# Patient Record
Sex: Female | Born: 1950 | Race: Black or African American | Hispanic: No | Marital: Married | Smoking: Former smoker
Health system: Southern US, Community
[De-identification: ages and names within clinical notes are randomized; demographics above are authoritative.]

## PROBLEM LIST (undated history)

## (undated) DIAGNOSIS — M545 Low back pain: Secondary | ICD-10-CM

## (undated) DIAGNOSIS — M546 Pain in thoracic spine: Secondary | ICD-10-CM

## (undated) DIAGNOSIS — R0603 Acute respiratory distress: Secondary | ICD-10-CM

## (undated) DIAGNOSIS — R9431 Abnormal electrocardiogram [ECG] [EKG]: Secondary | ICD-10-CM

## (undated) DIAGNOSIS — Z6841 Body Mass Index (BMI) 40.0 and over, adult: Secondary | ICD-10-CM

## (undated) DIAGNOSIS — R1011 Right upper quadrant pain: Secondary | ICD-10-CM

## (undated) DIAGNOSIS — R222 Localized swelling, mass and lump, trunk: Secondary | ICD-10-CM

## (undated) DIAGNOSIS — D729 Disorder of white blood cells, unspecified: Secondary | ICD-10-CM

## (undated) DIAGNOSIS — D649 Anemia, unspecified: Secondary | ICD-10-CM

## (undated) DIAGNOSIS — R0602 Shortness of breath: Secondary | ICD-10-CM

## (undated) DIAGNOSIS — K219 Gastro-esophageal reflux disease without esophagitis: Secondary | ICD-10-CM

## (undated) DIAGNOSIS — E876 Hypokalemia: Secondary | ICD-10-CM

## (undated) DIAGNOSIS — J90 Pleural effusion, not elsewhere classified: Secondary | ICD-10-CM

## (undated) DIAGNOSIS — Z9221 Personal history of antineoplastic chemotherapy: Secondary | ICD-10-CM

## (undated) DIAGNOSIS — R591 Generalized enlarged lymph nodes: Secondary | ICD-10-CM

## (undated) DIAGNOSIS — G8929 Other chronic pain: Secondary | ICD-10-CM

## (undated) DIAGNOSIS — R05 Cough: Secondary | ICD-10-CM

## (undated) DIAGNOSIS — C8330 Diffuse large B-cell lymphoma, unspecified site: Secondary | ICD-10-CM

## (undated) DIAGNOSIS — R059 Cough, unspecified: Secondary | ICD-10-CM

## (undated) DIAGNOSIS — C8590 Non-Hodgkin lymphoma, unspecified, unspecified site: Secondary | ICD-10-CM

## (undated) DIAGNOSIS — Z9889 Other specified postprocedural states: Secondary | ICD-10-CM

## (undated) HISTORY — PX: ABDOMINAL HYSTERECTOMY: SHX81

---

## 1999-05-13 ENCOUNTER — Other Ambulatory Visit: Admission: RE | Admit: 1999-05-13 | Discharge: 1999-05-13 | Payer: Self-pay | Admitting: Obstetrics and Gynecology

## 1999-05-20 ENCOUNTER — Ambulatory Visit (HOSPITAL_COMMUNITY): Admission: RE | Admit: 1999-05-20 | Discharge: 1999-05-20 | Payer: Self-pay | Admitting: Obstetrics

## 2001-05-06 ENCOUNTER — Ambulatory Visit (HOSPITAL_COMMUNITY): Admission: RE | Admit: 2001-05-06 | Discharge: 2001-05-06 | Payer: Self-pay | Admitting: *Deleted

## 2001-05-28 ENCOUNTER — Encounter: Payer: Self-pay | Admitting: Internal Medicine

## 2001-05-28 ENCOUNTER — Ambulatory Visit (HOSPITAL_COMMUNITY): Admission: RE | Admit: 2001-05-28 | Discharge: 2001-05-28 | Payer: Self-pay | Admitting: Internal Medicine

## 2001-05-30 ENCOUNTER — Ambulatory Visit (HOSPITAL_COMMUNITY): Admission: RE | Admit: 2001-05-30 | Discharge: 2001-05-30 | Payer: Self-pay | Admitting: Internal Medicine

## 2002-06-19 ENCOUNTER — Other Ambulatory Visit: Admission: RE | Admit: 2002-06-19 | Discharge: 2002-06-19 | Payer: Self-pay | Admitting: Internal Medicine

## 2005-05-24 ENCOUNTER — Encounter: Admission: RE | Admit: 2005-05-24 | Discharge: 2005-05-24 | Payer: Self-pay | Admitting: Surgery

## 2007-09-21 ENCOUNTER — Emergency Department (HOSPITAL_COMMUNITY): Admission: EM | Admit: 2007-09-21 | Discharge: 2007-09-21 | Payer: Self-pay | Admitting: Family Medicine

## 2008-04-23 ENCOUNTER — Emergency Department (HOSPITAL_COMMUNITY): Admission: EM | Admit: 2008-04-23 | Discharge: 2008-04-23 | Payer: Self-pay | Admitting: Family Medicine

## 2009-03-03 ENCOUNTER — Other Ambulatory Visit: Admission: RE | Admit: 2009-03-03 | Discharge: 2009-03-03 | Payer: Self-pay | Admitting: Internal Medicine

## 2009-05-24 ENCOUNTER — Emergency Department (HOSPITAL_COMMUNITY): Admission: EM | Admit: 2009-05-24 | Discharge: 2009-05-24 | Payer: Self-pay | Admitting: Family Medicine

## 2010-01-23 ENCOUNTER — Encounter: Payer: Self-pay | Admitting: Surgery

## 2010-03-21 LAB — POCT RAPID STREP A (OFFICE): Streptococcus, Group A Screen (Direct): POSITIVE — AB

## 2010-05-20 NOTE — Procedures (Signed)
[facility]  Patient:    Jo, Collins Visit Number: [id number] MRN: [id number]          Service Type: END Location: ENDO Attending Physician:  Sabino Gasser Dictated by:   Sabino Gasser, M.D. Proc. Date: 05/06/01 Admit Date:  05/06/2001                             Procedure Report  DATE OF BIRTH:  March 30, 1950.  PROCEDURE:  Colonoscopy.  INDICATIONS:  Colon cancer screening.  ANESTHESIA:  Demerol 70 mg, Versed 8 mg.  DESCRIPTION OF PROCEDURE:  With the patient mildly sedated in the left lateral decubitus position, the Olympus videoscopic colonoscope was inserted in the rectum and passed under direct vision to the cecum, identified by the ileocecal valve and appendiceal orifice, both of which were photographed. From this point, the colonoscope was slowly withdrawn taking circumferential views of the entire colonic mucosa stopping only then in the rectum, which appeared normal in direct view and showed hemorrhoids in retroflex view. The endoscope was straightened and withdrawn. The patients vital signs and pulse oximeter remained stable. The patient tolerated the procedure well without apparent complications.  FINDINGS:  Internal hemorrhoids, otherwise an unremarkable examination.  PLAN:  Repeat examination possibly in five years. Dictated by:   Sabino Gasser, M.D. Attending Physician:  Sabino Gasser DD:  05/06/01 TD:  05/06/01 Job: [id number]

## 2016-07-31 ENCOUNTER — Encounter (HOSPITAL_COMMUNITY): Payer: Self-pay | Admitting: *Deleted

## 2016-07-31 ENCOUNTER — Emergency Department (HOSPITAL_COMMUNITY)
Admission: EM | Admit: 2016-07-31 | Discharge: 2016-07-31 | Disposition: A | Discharge status: Home / Self Care | Payer: Medicare Other | Attending: Emergency Medicine | Admitting: Emergency Medicine

## 2016-07-31 ENCOUNTER — Ambulatory Visit (INDEPENDENT_AMBULATORY_CARE_PROVIDER_SITE_OTHER): Payer: Medicare Other

## 2016-07-31 ENCOUNTER — Encounter (HOSPITAL_COMMUNITY): Payer: Self-pay

## 2016-07-31 ENCOUNTER — Ambulatory Visit (HOSPITAL_COMMUNITY)
Admission: EM | Admit: 2016-07-31 | Discharge: 2016-07-31 | Disposition: A | Discharge status: Home / Self Care | Payer: Medicare Other | Attending: Emergency Medicine | Admitting: Emergency Medicine

## 2016-07-31 DIAGNOSIS — Z87891 Personal history of nicotine dependence: Secondary | ICD-10-CM | POA: Diagnosis not present

## 2016-07-31 DIAGNOSIS — J181 Lobar pneumonia, unspecified organism: Secondary | ICD-10-CM | POA: Diagnosis not present

## 2016-07-31 DIAGNOSIS — R062 Wheezing: Secondary | ICD-10-CM | POA: Diagnosis not present

## 2016-07-31 DIAGNOSIS — J189 Pneumonia, unspecified organism: Secondary | ICD-10-CM

## 2016-07-31 DIAGNOSIS — R0602 Shortness of breath: Secondary | ICD-10-CM

## 2016-07-31 LAB — POCT I-STAT, CHEM 8
BUN: 8 mg/dL (ref 6–20)
CHLORIDE: 111 mmol/L (ref 101–111)
CREATININE: 0.9 mg/dL (ref 0.44–1.00)
Calcium, Ion: 0.81 mmol/L — CL (ref 1.15–1.40)
GLUCOSE: 96 mg/dL (ref 65–99)
HCT: 36 % (ref 36.0–46.0)
Hemoglobin: 12.2 g/dL (ref 12.0–15.0)
POTASSIUM: 3.7 mmol/L (ref 3.5–5.1)
Sodium: 137 mmol/L (ref 135–145)
TCO2: 18 mmol/L (ref 0–100)

## 2016-07-31 MED ORDER — IPRATROPIUM-ALBUTEROL 0.5-2.5 (3) MG/3ML IN SOLN
3.0000 mL | Freq: Once | RESPIRATORY_TRACT | Status: AC
  Administered 2016-07-31: 3 mL via RESPIRATORY_TRACT

## 2016-07-31 MED ORDER — IPRATROPIUM-ALBUTEROL 0.5-2.5 (3) MG/3ML IN SOLN
RESPIRATORY_TRACT | Status: AC
  Filled 2016-07-31: qty 3

## 2016-07-31 MED ORDER — ALBUTEROL SULFATE HFA 108 (90 BASE) MCG/ACT IN AERS
2.0000 | INHALATION_SPRAY | Freq: Once | RESPIRATORY_TRACT | Status: AC
Start: 2016-07-31 — End: 2016-07-31
  Administered 2016-07-31: 2 via RESPIRATORY_TRACT
  Filled 2016-07-31: qty 6.7

## 2016-07-31 MED ORDER — SODIUM CHLORIDE 0.9 % IN NEBU
INHALATION_SOLUTION | RESPIRATORY_TRACT | Status: AC
  Filled 2016-07-31: qty 3

## 2016-07-31 MED ORDER — DOXYCYCLINE HYCLATE 100 MG PO CAPS: 100.0000 mg | ORAL_CAPSULE | Freq: Two times a day (BID) | ORAL | 0 refills | Status: DC

## 2016-07-31 NOTE — Discharge Instructions (Signed)
Take tylenol 2 pills 4 times a day for pain or fever.  Drink plenty of fluids.  Return for worsening shortness of breath, headache, confusion. Follow up with your family doctor. Use your inhaler(2puffs) every 4 hours while awake for the next couple days, return if you need to use it more often.

## 2016-07-31 NOTE — Discharge Instructions (Signed)
Based on your signs, symptoms, and blood work, these meet the criteria for possible hospital admission, however this decision will be made down in the emergency room, but I believe your too sick to be treated here in the urgent care. Because of this I recommend going to the ER.

## 2016-07-31 NOTE — ED Notes (Signed)
Patient is getting dressed.

## 2016-07-31 NOTE — ED Provider Notes (Signed)
CSN: [id number]     Arrival date & time 07/31/16  1603 History   None    No chief complaint on file.  (Consider location/radiation/quality/duration/timing/severity/associated sxs/prior Treatment) 66 year old female presents to clinic for evaluation of wheezing and shortness of breath. States he's been ongoing for 3 days, worse with exertion. She is concerned about the possibility of bronchitis, and she has had this before and has had similar symptoms. Her symptoms are worse with exertion, states she becomes winded after walking short distances. She has no known allergies, denies any medical history, however she does appear obese. Is not taking any prescription medicines either. She does smoke daily, however does not drink or use street drugs. Otherwise reports to be in good health.      History reviewed. No pertinent past medical history. History reviewed. No pertinent surgical history. History reviewed. No pertinent family history. Social History  Substance Use Topics  . Smoking status: Current Every Day Smoker    Types: Cigarettes  . Smokeless tobacco: Not on file  . Alcohol use No   OB History    No data available     Review of Systems  Constitutional: Positive for chills, fatigue and fever.  Respiratory: Positive for cough, chest tightness, shortness of breath and wheezing.   Cardiovascular: Negative.   Gastrointestinal: Negative.   Musculoskeletal: Negative.   Skin: Negative.   Neurological: Negative.     Allergies  Patient has no known allergies.  Home Medications   Prior to Admission medications   Not on File   Meds Ordered and Administered this Visit   Medications  ipratropium-albuterol (DUONEB) 0.5-2.5 (3) MG/3ML nebulizer solution 3 mL (3 mLs Nebulization Given 07/31/16 1651)    BP 123/84 (BP Location: Right Arm)   Pulse 72   Temp 98.5 F (36.9 C) (Oral)   Resp (!) 24   SpO2 100%  No data found.   Physical Exam  Constitutional: She is oriented to  person, place, and time. She appears well-developed and well-nourished. No distress.  HENT:  Head: Normocephalic.  Right Ear: External ear normal.  Left Ear: External ear normal.  Eyes: Conjunctivae are normal.  Neck: Normal range of motion.  Cardiovascular: Normal rate and regular rhythm.   Pulmonary/Chest: Tachypnea noted. She has wheezes in the right upper field and the left upper field. She has rhonchi in the right middle field and the right lower field.  Neurological: She is alert and oriented to person, place, and time.  Skin: Skin is warm and dry. Capillary refill takes less than 2 seconds. She is not diaphoretic.  Psychiatric: She has a normal mood and affect. Her behavior is normal.  Nursing note and vitals reviewed.   Urgent Care Course     Procedures (including critical care time)  Labs Review Labs Reviewed  POCT I-STAT, CHEM 8 - Abnormal; Notable for the following:       Result Value   Calcium, Ion 0.81 (*)    All other components within normal limits    Imaging Review Dg Chest 2 View  Result Date: 07/31/2016 CLINICAL DATA:  Three days of nonproductive cough associated with shortness of breath and wheezing. Current smoker. EXAM: CHEST  2 VIEW COMPARISON:  Report of a chest x-ray of May 28, 2001. FINDINGS: The left lung is clear. On the right there is volume loss with obscuration of the hemidiaphragm. There is fluid in the minor fissure. There is no mediastinal shift. The heart and pulmonary vascularity are normal. There is mild  tortuosity of the ascending thoracic aorta. The bony thorax exhibits no acute abnormality. IMPRESSION: Atelectasis or pneumonia with right pleural E fusion. Followup PA and lateral chest X-ray is recommended in 3-4 weeks following trial of antibiotic therapy to ensure resolution and exclude underlying malignancy. Electronically Signed   By: David  Martinique M.D.   On: 07/31/2016 16:50     Visual Acuity Review  Right Eye Distance:   Left Eye  Distance:   Bilateral Distance:    Right Eye Near:   Left Eye Near:    Bilateral Near:         MDM   1. Community acquired pneumonia of right lower lobe of lung ([place])      CURB-65  C: Confusion (-) U: BUN 8 R: RR (24) BP: not hypotensive Age 46 or greater: (+)  Score: 2, meets criteria for admission. Will transfer to the ER for further evaluation and management.    Barnet Glasgow, NP 07/31/16 1715

## 2016-07-31 NOTE — ED Triage Notes (Signed)
Pt sent here for urgent care for PNA. She was seen there for SOb and received a breathing treatment. Pt denies shortness of breath now. Denies chest pain. Lung sounds clear.

## 2016-07-31 NOTE — ED Triage Notes (Addendum)
Pt  Denies   Any  Chest  Pain   She  Is  A  Smoker   Who  Has  Had   Several   Days  Of   Shortness  Of  Breath  On  Exertion   At this  Time  She  Is  Awake  And  Alert   And  Oriented     She   Is  Coughing

## 2016-07-31 NOTE — ED Provider Notes (Addendum)
[facility] DEPT Provider Note   CSN: [id number] Arrival date & time: 07/31/16  1722     History   Chief Complaint Chief Complaint  Patient presents with  . Pneumonia    HPI Jo Collins is a 66 y.o. female.  66 yo F with a chief complaint of shortness of breath. She is having cough and congestion as well. Going on for the past 3 days. She was having difficulty with smoking so she quit. She went to urgent care where she had a chest x-ray concerning for right-sided pneumonia. They were concerned about her shortness of breath so they sent her to the ED for further evaluation and possible admission.   The history is provided by the patient.  Pneumonia  This is a new problem. The current episode started 2 days ago. The problem occurs constantly. The problem has been gradually worsening. Associated symptoms include shortness of breath. Pertinent negatives include no chest pain and no headaches. Nothing aggravates the symptoms. Nothing relieves the symptoms. She has tried nothing for the symptoms. The treatment provided no relief.    History reviewed. No pertinent past medical history.  There are no active problems to display for this patient.   History reviewed. No pertinent surgical history.  OB History    No data available       Home Medications    Prior to Admission medications   Medication Sig Start Date End Date Taking? Authorizing Provider  doxycycline (VIBRAMYCIN) 100 MG capsule Take 1 capsule (100 mg total) by mouth 2 (two) times daily. 07/31/16   Deno Etienne, DO    Family History History reviewed. No pertinent family history.  Social History Social History  Substance Use Topics  . Smoking status: Former Smoker    Types: Cigarettes    Quit date: 07/28/2016  . Smokeless tobacco: Never Used  . Alcohol use No     Allergies   Patient has no known allergies.   Review of Systems Review of Systems  Constitutional: Negative for chills and fever.  HENT:  Positive for congestion. Negative for rhinorrhea.   Eyes: Negative for redness and visual disturbance.  Respiratory: Positive for cough and shortness of breath. Negative for wheezing.   Cardiovascular: Negative for chest pain and palpitations.  Gastrointestinal: Negative for nausea and vomiting.  Genitourinary: Negative for dysuria and urgency.  Musculoskeletal: Negative for arthralgias and myalgias.  Skin: Negative for pallor and wound.  Neurological: Negative for dizziness and headaches.     Physical Exam Updated Vital Signs BP (!) 143/77 (BP Location: Left Arm)   Pulse 73   Temp 98.3 F (36.8 C) (Oral)   Resp (!) 26   Ht 5' 1.5" (1.562 m)   Wt 129.3 kg (285 lb)   SpO2 97%   BMI 52.98 kg/m   Physical Exam  Constitutional: She is oriented to person, place, and time. She appears well-developed and well-nourished. No distress.  obese  HENT:  Head: Normocephalic and atraumatic.  Swollen turbinates, posterior nasal drip, no noted sinus ttp, tm normal bilaterally.    Eyes: Pupils are equal, round, and reactive to light. EOM are normal.  Neck: Normal range of motion. Neck supple.  Cardiovascular: Normal rate and regular rhythm.  Exam reveals no gallop and no friction rub.   No murmur heard. Pulmonary/Chest: Effort normal. She has no wheezes. She has no rales.  Abdominal: Soft. She exhibits no distension and no mass. There is no tenderness. There is no guarding.  Musculoskeletal: She exhibits no edema  or tenderness.  Neurological: She is alert and oriented to person, place, and time.  Skin: Skin is warm and dry. She is not diaphoretic.  Psychiatric: She has a normal mood and affect. Her behavior is normal.  Nursing note and vitals reviewed.    ED Treatments / Results  Labs (all labs ordered are listed, but only abnormal results are displayed) Labs Reviewed - No data to display  EKG  EKG Interpretation None       Radiology Dg Chest 2 View  Result Date:  07/31/2016 CLINICAL DATA:  Three days of nonproductive cough associated with shortness of breath and wheezing. Current smoker. EXAM: CHEST  2 VIEW COMPARISON:  Report of a chest x-ray of May 28, 2001. FINDINGS: The left lung is clear. On the right there is volume loss with obscuration of the hemidiaphragm. There is fluid in the minor fissure. There is no mediastinal shift. The heart and pulmonary vascularity are normal. There is mild tortuosity of the ascending thoracic aorta. The bony thorax exhibits no acute abnormality. IMPRESSION: Atelectasis or pneumonia with right pleural E fusion. Followup PA and lateral chest X-ray is recommended in 3-4 weeks following trial of antibiotic therapy to ensure resolution and exclude underlying malignancy. Electronically Signed   By: David  Martinique M.D.   On: 07/31/2016 16:50    Procedures Procedures (including critical care time)  Medications Ordered in ED Medications  albuterol (PROVENTIL HFA;VENTOLIN HFA) 108 (90 Base) MCG/ACT inhaler 2 puff (not administered)     Initial Impression / Assessment and Plan / ED Course  I have reviewed the triage vital signs and the nursing notes.  Pertinent labs & imaging results that were available during my care of the patient were reviewed by me and considered in my medical decision making (see chart for details).     66 yo F With a chief complaint of cough and shortness of breath. Found to have pneumonia at urgent care. They calculated a curb 65 score of 2. By calculation has the patient had a 1. Respiratory rate is less than 30 her BUNs normal she is not hypoxic or hypotensive. I discussed this risk with the patient and she is willing to do this trial is an outpatient. She did get a breathing treatment at urgent care that she thinks significantly improved her symptoms. She has no wheezes on my exam. I will give her an albuterol inhaler to take home.  Discussed smoking cessation with patient and was they were offerred  resources to help stop.  Total time was 5 min CPT code 99406.    8:04 PM:  I have discussed the diagnosis/risks/treatment options with the patient and believe the pt to be eligible for discharge home to follow-up with PCP. We also discussed returning to the ED immediately if new or worsening sx occur. We discussed the sx which are most concerning (e.g., sudden worsening sob, need to use your inhaler more often than every 4 hours, confusion) that necessitate immediate return. Medications administered to the patient during their visit and any new prescriptions provided to the patient are listed below.  Medications given during this visit Medications  albuterol (PROVENTIL HFA;VENTOLIN HFA) 108 (90 Base) MCG/ACT inhaler 2 puff (not administered)     The patient appears reasonably screen and/or stabilized for discharge and I doubt any other medical condition or other [facility] requiring further screening, evaluation, or treatment in the ED at this time prior to discharge.    Final Clinical Impressions(s) / ED Diagnoses  Final diagnoses:  Community acquired pneumonia of right lower lobe of lung ([place])    New Prescriptions New Prescriptions   DOXYCYCLINE (VIBRAMYCIN) 100 MG CAPSULE    Take 1 capsule (100 mg total) by mouth 2 (two) times daily.         Deno Etienne, DO 07/31/16 2004    Deno Etienne, DO 07/31/16 2013

## 2016-07-31 NOTE — ED Notes (Signed)
Pt denies CP and SOB and does not want another EKG at this time.

## 2016-08-06 ENCOUNTER — Inpatient Hospital Stay (HOSPITAL_COMMUNITY)
Admission: EM | Admit: 2016-08-06 | Discharge: 2016-08-15 | DRG: 824 | Disposition: A | Discharge status: Home / Self Care | Payer: Medicare Other | Attending: Internal Medicine | Admitting: Internal Medicine

## 2016-08-06 ENCOUNTER — Emergency Department (HOSPITAL_COMMUNITY): Payer: Medicare Other

## 2016-08-06 ENCOUNTER — Encounter (HOSPITAL_COMMUNITY): Payer: Self-pay | Admitting: Emergency Medicine

## 2016-08-06 DIAGNOSIS — Z833 Family history of diabetes mellitus: Secondary | ICD-10-CM

## 2016-08-06 DIAGNOSIS — K219 Gastro-esophageal reflux disease without esophagitis: Secondary | ICD-10-CM | POA: Diagnosis present

## 2016-08-06 DIAGNOSIS — Z825 Family history of asthma and other chronic lower respiratory diseases: Secondary | ICD-10-CM

## 2016-08-06 DIAGNOSIS — Z803 Family history of malignant neoplasm of breast: Secondary | ICD-10-CM

## 2016-08-06 DIAGNOSIS — D649 Anemia, unspecified: Secondary | ICD-10-CM | POA: Diagnosis present

## 2016-08-06 DIAGNOSIS — R222 Localized swelling, mass and lump, trunk: Secondary | ICD-10-CM | POA: Diagnosis present

## 2016-08-06 DIAGNOSIS — J9811 Atelectasis: Secondary | ICD-10-CM | POA: Diagnosis present

## 2016-08-06 DIAGNOSIS — C8338 Diffuse large B-cell lymphoma, lymph nodes of multiple sites: Principal | ICD-10-CM | POA: Diagnosis present

## 2016-08-06 DIAGNOSIS — R1011 Right upper quadrant pain: Secondary | ICD-10-CM | POA: Diagnosis present

## 2016-08-06 DIAGNOSIS — Z823 Family history of stroke: Secondary | ICD-10-CM

## 2016-08-06 DIAGNOSIS — R591 Generalized enlarged lymph nodes: Secondary | ICD-10-CM

## 2016-08-06 DIAGNOSIS — Z87891 Personal history of nicotine dependence: Secondary | ICD-10-CM

## 2016-08-06 DIAGNOSIS — J90 Pleural effusion, not elsewhere classified: Secondary | ICD-10-CM | POA: Diagnosis not present

## 2016-08-06 DIAGNOSIS — E538 Deficiency of other specified B group vitamins: Secondary | ICD-10-CM | POA: Diagnosis present

## 2016-08-06 DIAGNOSIS — R0602 Shortness of breath: Secondary | ICD-10-CM | POA: Diagnosis present

## 2016-08-06 DIAGNOSIS — R9431 Abnormal electrocardiogram [ECG] [EKG]: Secondary | ICD-10-CM | POA: Diagnosis present

## 2016-08-06 DIAGNOSIS — Z8249 Family history of ischemic heart disease and other diseases of the circulatory system: Secondary | ICD-10-CM

## 2016-08-06 DIAGNOSIS — M546 Pain in thoracic spine: Secondary | ICD-10-CM

## 2016-08-06 DIAGNOSIS — C8590 Non-Hodgkin lymphoma, unspecified, unspecified site: Secondary | ICD-10-CM | POA: Diagnosis present

## 2016-08-06 DIAGNOSIS — Z6841 Body Mass Index (BMI) 40.0 and over, adult: Secondary | ICD-10-CM

## 2016-08-06 DIAGNOSIS — Z9889 Other specified postprocedural states: Secondary | ICD-10-CM

## 2016-08-06 DIAGNOSIS — J9690 Respiratory failure, unspecified, unspecified whether with hypoxia or hypercapnia: Secondary | ICD-10-CM

## 2016-08-06 HISTORY — DX: Gastro-esophageal reflux disease without esophagitis: K21.9

## 2016-08-06 HISTORY — DX: Non-Hodgkin lymphoma, unspecified, unspecified site: C85.90

## 2016-08-06 LAB — BASIC METABOLIC PANEL
Anion gap: 7 (ref 5–15)
BUN: 17 mg/dL (ref 6–20)
CALCIUM: 9.1 mg/dL (ref 8.9–10.3)
CO2: 23 mmol/L (ref 22–32)
CREATININE: 1.09 mg/dL — AB (ref 0.44–1.00)
Chloride: 109 mmol/L (ref 101–111)
GFR calc Af Amer: >60 mL/min (ref >60)
GFR, EST NON AFRICAN AMERICAN: 52 mL/min — AB (ref >60)
Glucose, Bld: 109 mg/dL — ABNORMAL HIGH (ref 65–99)
Potassium: 4 mmol/L (ref 3.5–5.1)
SODIUM: 139 mmol/L (ref 135–145)

## 2016-08-06 LAB — CBC WITH DIFFERENTIAL/PLATELET
Basophils Absolute: 0 10*3/uL (ref 0.0–0.1)
Basophils Relative: 0 %
EOS ABS: 1.3 10*3/uL — AB (ref 0.0–0.7)
EOS PCT: 13 %
HCT: 36.5 % (ref 36.0–46.0)
Hemoglobin: 11.7 g/dL — ABNORMAL LOW (ref 12.0–15.0)
LYMPHS ABS: 1.6 10*3/uL (ref 0.7–4.0)
Lymphocytes Relative: 16 %
MCH: 27 pg (ref 26.0–34.0)
MCHC: 32.1 g/dL (ref 30.0–36.0)
MCV: 84.1 fL (ref 78.0–100.0)
MONOS PCT: 8 %
Monocytes Absolute: 0.8 10*3/uL (ref 0.1–1.0)
Neutro Abs: 6.6 10*3/uL (ref 1.7–7.7)
Neutrophils Relative %: 63 %
PLATELETS: 323 10*3/uL (ref 150–400)
RBC: 4.34 MIL/uL (ref 3.87–5.11)
RDW: 13 % (ref 11.5–15.5)
WBC: 10.2 10*3/uL (ref 4.0–10.5)

## 2016-08-06 NOTE — ED Notes (Signed)
Labs reviewed.

## 2016-08-06 NOTE — ED Notes (Signed)
Patient transported to CT 

## 2016-08-06 NOTE — ED Triage Notes (Signed)
Pt c/o sob and lower back pain, last seen on ED last Monday and sent home with po abx and inhaler, pt states she is getting progressively worse unable to sleep at night time.

## 2016-08-07 ENCOUNTER — Emergency Department (HOSPITAL_COMMUNITY): Payer: Medicare Other

## 2016-08-07 ENCOUNTER — Inpatient Hospital Stay (HOSPITAL_COMMUNITY): Payer: Medicare Other

## 2016-08-07 ENCOUNTER — Encounter (HOSPITAL_COMMUNITY): Payer: Self-pay | Admitting: Internal Medicine

## 2016-08-07 DIAGNOSIS — D649 Anemia, unspecified: Secondary | ICD-10-CM | POA: Diagnosis present

## 2016-08-07 DIAGNOSIS — R9431 Abnormal electrocardiogram [ECG] [EKG]: Secondary | ICD-10-CM | POA: Diagnosis present

## 2016-08-07 DIAGNOSIS — K219 Gastro-esophageal reflux disease without esophagitis: Secondary | ICD-10-CM | POA: Diagnosis present

## 2016-08-07 DIAGNOSIS — D518 Other vitamin B12 deficiency anemias: Secondary | ICD-10-CM | POA: Diagnosis not present

## 2016-08-07 DIAGNOSIS — Z833 Family history of diabetes mellitus: Secondary | ICD-10-CM | POA: Diagnosis not present

## 2016-08-07 DIAGNOSIS — J90 Pleural effusion, not elsewhere classified: Secondary | ICD-10-CM

## 2016-08-07 DIAGNOSIS — R591 Generalized enlarged lymph nodes: Secondary | ICD-10-CM | POA: Diagnosis not present

## 2016-08-07 DIAGNOSIS — R1011 Right upper quadrant pain: Secondary | ICD-10-CM | POA: Diagnosis not present

## 2016-08-07 DIAGNOSIS — Z825 Family history of asthma and other chronic lower respiratory diseases: Secondary | ICD-10-CM | POA: Diagnosis not present

## 2016-08-07 DIAGNOSIS — R0602 Shortness of breath: Secondary | ICD-10-CM

## 2016-08-07 DIAGNOSIS — E538 Deficiency of other specified B group vitamins: Secondary | ICD-10-CM | POA: Diagnosis present

## 2016-08-07 DIAGNOSIS — C8228 Follicular lymphoma grade III, unspecified, lymph nodes of multiple sites: Secondary | ICD-10-CM | POA: Diagnosis not present

## 2016-08-07 DIAGNOSIS — Z87891 Personal history of nicotine dependence: Secondary | ICD-10-CM | POA: Diagnosis not present

## 2016-08-07 DIAGNOSIS — D500 Iron deficiency anemia secondary to blood loss (chronic): Secondary | ICD-10-CM

## 2016-08-07 DIAGNOSIS — Z803 Family history of malignant neoplasm of breast: Secondary | ICD-10-CM | POA: Diagnosis not present

## 2016-08-07 DIAGNOSIS — J9811 Atelectasis: Secondary | ICD-10-CM | POA: Diagnosis present

## 2016-08-07 DIAGNOSIS — Z823 Family history of stroke: Secondary | ICD-10-CM | POA: Diagnosis not present

## 2016-08-07 DIAGNOSIS — R222 Localized swelling, mass and lump, trunk: Secondary | ICD-10-CM

## 2016-08-07 DIAGNOSIS — C8359 Lymphoblastic (diffuse) lymphoma, extranodal and solid organ sites: Secondary | ICD-10-CM | POA: Diagnosis not present

## 2016-08-07 DIAGNOSIS — C8338 Diffuse large B-cell lymphoma, lymph nodes of multiple sites: Secondary | ICD-10-CM | POA: Diagnosis present

## 2016-08-07 DIAGNOSIS — M546 Pain in thoracic spine: Secondary | ICD-10-CM | POA: Diagnosis not present

## 2016-08-07 DIAGNOSIS — Z8249 Family history of ischemic heart disease and other diseases of the circulatory system: Secondary | ICD-10-CM | POA: Diagnosis not present

## 2016-08-07 DIAGNOSIS — Z6841 Body Mass Index (BMI) 40.0 and over, adult: Secondary | ICD-10-CM | POA: Diagnosis not present

## 2016-08-07 HISTORY — PX: IR THORACENTESIS ASP PLEURAL SPACE W/IMG GUIDE: IMG5380

## 2016-08-07 HISTORY — DX: Pleural effusion, not elsewhere classified: J90

## 2016-08-07 HISTORY — DX: Abnormal electrocardiogram (ECG) (EKG): R94.31

## 2016-08-07 HISTORY — DX: Anemia, unspecified: D64.9

## 2016-08-07 HISTORY — DX: Shortness of breath: R06.02

## 2016-08-07 HISTORY — DX: Localized swelling, mass and lump, trunk: R22.2

## 2016-08-07 LAB — HEPATIC FUNCTION PANEL
ALK PHOS: 51 U/L (ref 38–126)
ALT: 19 U/L (ref 14–54)
AST: 20 U/L (ref 15–41)
Albumin: 3.1 g/dL — ABNORMAL LOW (ref 3.5–5.0)
BILIRUBIN TOTAL: 0.6 mg/dL (ref 0.3–1.2)
Total Protein: 6.6 g/dL (ref 6.5–8.1)

## 2016-08-07 LAB — PROTEIN, TOTAL: TOTAL PROTEIN: 6.6 g/dL (ref 6.5–8.1)

## 2016-08-07 LAB — GRAM STAIN

## 2016-08-07 LAB — BODY FLUID CELL COUNT WITH DIFFERENTIAL
EOS FL: 23 %
LYMPHS FL: 57 %
Monocyte-Macrophage-Serous Fluid: 19 % — ABNORMAL LOW (ref 50–90)
Neutrophil Count, Fluid: 1 % (ref 0–25)
Total Nucleated Cell Count, Fluid: 3550 cu mm — ABNORMAL HIGH (ref 0–1000)

## 2016-08-07 LAB — LACTATE DEHYDROGENASE, PLEURAL OR PERITONEAL FLUID: LD FL: 180 U/L — AB (ref 3–23)

## 2016-08-07 LAB — PROTIME-INR
INR: 1.09
Prothrombin Time: 14.2 seconds (ref 11.4–15.2)

## 2016-08-07 LAB — PROTEIN, PLEURAL OR PERITONEAL FLUID: Total protein, fluid: 3.9 g/dL

## 2016-08-07 LAB — LACTATE DEHYDROGENASE: LDH: 229 U/L — ABNORMAL HIGH (ref 98–192)

## 2016-08-07 LAB — AMYLASE, PLEURAL OR PERITONEAL FLUID: Amylase, Fluid: 31 U/L

## 2016-08-07 LAB — I-STAT CG4 LACTIC ACID, ED: Lactic Acid, Venous: 1.08 mmol/L (ref 0.5–1.9)

## 2016-08-07 LAB — BRAIN NATRIURETIC PEPTIDE: B Natriuretic Peptide: 39.4 pg/mL (ref 0.0–100.0)

## 2016-08-07 MED ORDER — OXYCODONE-ACETAMINOPHEN 5-325 MG PO TABS
1.0000 | ORAL_TABLET | ORAL | Status: DC | PRN
  Administered 2016-08-07 – 2016-08-10 (×11): 1 via ORAL
  Filled 2016-08-07 (×11): qty 1

## 2016-08-07 MED ORDER — OXYCODONE-ACETAMINOPHEN 5-325 MG PO TABS
1.0000 | ORAL_TABLET | Freq: Once | ORAL | Status: AC
  Administered 2016-08-07: 1 via ORAL
  Filled 2016-08-07: qty 1

## 2016-08-07 MED ORDER — ORAL CARE MOUTH RINSE
15.0000 mL | Freq: Two times a day (BID) | OROMUCOSAL | Status: DC
  Administered 2016-08-07 – 2016-08-14 (×6): 15 mL via OROMUCOSAL

## 2016-08-07 MED ORDER — PANTOPRAZOLE SODIUM 40 MG PO TBEC
40.0000 mg | DELAYED_RELEASE_TABLET | Freq: Every day | ORAL | Status: DC
  Administered 2016-08-07 – 2016-08-15 (×9): 40 mg via ORAL
  Filled 2016-08-07 (×10): qty 1

## 2016-08-07 MED ORDER — SODIUM CHLORIDE 0.9 % IV SOLN
INTRAVENOUS | Status: AC
  Administered 2016-08-07 – 2016-08-08 (×2): via INTRAVENOUS

## 2016-08-07 MED ORDER — ALPRAZOLAM 0.25 MG PO TABS
0.2500 mg | ORAL_TABLET | Freq: Three times a day (TID) | ORAL | Status: DC | PRN
  Administered 2016-08-07 – 2016-08-10 (×3): 0.25 mg via ORAL
  Filled 2016-08-07 (×6): qty 1

## 2016-08-07 MED ORDER — KETOROLAC TROMETHAMINE 30 MG/ML IJ SOLN
30.0000 mg | Freq: Once | INTRAMUSCULAR | Status: AC
  Administered 2016-08-07: 30 mg via INTRAVENOUS
  Filled 2016-08-07: qty 1

## 2016-08-07 MED ORDER — LIDOCAINE HCL (PF) 1 % IJ SOLN
INTRAMUSCULAR | Status: AC
  Filled 2016-08-07: qty 30

## 2016-08-07 MED ORDER — LIDOCAINE HCL 1 % IJ SOLN
INTRAMUSCULAR | Status: AC | PRN
  Administered 2016-08-07: 10 mL

## 2016-08-07 MED ORDER — IPRATROPIUM-ALBUTEROL 0.5-2.5 (3) MG/3ML IN SOLN
3.0000 mL | RESPIRATORY_TRACT | Status: DC | PRN
  Administered 2016-08-10 – 2016-08-14 (×8): 3 mL via RESPIRATORY_TRACT
  Filled 2016-08-07 (×9): qty 3

## 2016-08-07 MED ORDER — HEPARIN SODIUM (PORCINE) 5000 UNIT/ML IJ SOLN
5000.0000 [IU] | Freq: Three times a day (TID) | INTRAMUSCULAR | Status: DC
  Administered 2016-08-07 – 2016-08-08 (×3): 5000 [IU] via SUBCUTANEOUS
  Filled 2016-08-07 (×3): qty 1

## 2016-08-07 NOTE — ED Notes (Signed)
Pt was able to ambulate to restroom and back without assistance

## 2016-08-07 NOTE — Consult Note (Signed)
Name: Jamiyah Dingley MRN: [id number] DOB: [birth year]    ADMISSION DATE:  08/06/2016 CONSULTATION DATE:  08/07/16  REFERRING MD :  Allyson Sabal  CHIEF COMPLAINT:  SOB   HISTORY OF PRESENT ILLNESS:  Jo Collins is a 66 y.o. female with a PMH as outlined below.  She presented to [facility] ED 8/6 with SOB, progressive NP cough, orthopnea, back pain.  She had been seen a week prior and was discharged from ED with doxycycline and albuterol.  She took these but did not get much relief.  Had associated generalized fatigue along with chills and decreased appetite.  Denies fevers/sweats, chest pain, N/V/D, abd pain (although did have some mild nausea and abd pain after taking doxycycline).  She states that she never really "felt sick".  She had CXR which demonstrated a right pleural effusion.  CT of the chest demonstrated moderate loculated right sided effusion with compressive atelectasis, confluent prevertebral soft tissue abnormality along the descending thoracic aorta and esophagus concerning for an enlarged lymph node mass, bilateral 66mm nodular densities in upper lobes.  She was taken for right thoracentesis by IR 8/6 and PCCM was called in consultation for further recs and workup.  She has no hx of malignancy.  No prior hx effusions.  Denies weight loss, hemoptysis.  She is an ex smoker, quit roughly 1 week ago and has roughly 25 pack year history.   PAST MEDICAL HISTORY :   has a past medical history of GERD (gastroesophageal reflux disease).  has a past surgical history that includes Abdominal hysterectomy. Prior to Admission medications   Medication Sig Start Date End Date Taking? Authorizing Provider  doxycycline (VIBRAMYCIN) 100 MG capsule Take 1 capsule (100 mg total) by mouth 2 (two) times daily. 07/31/16   Deno Etienne, DO   No Known Allergies  FAMILY HISTORY:  family history includes COPD in her mother; Diabetes Mellitus II in her brother, sister, sister, and sister; Hypertension in her sister; Stroke  in her sister. SOCIAL HISTORY:  reports that she quit smoking 10 days ago. Her smoking use included Cigarettes. She has a 23.50 pack-year smoking history. She has never used smokeless tobacco. She reports that she does not drink alcohol.  REVIEW OF SYSTEMS:   All negative; except for those that are bolded, which indicate positives.  Constitutional: weight loss, weight gain, night sweats, fevers, chills, fatigue, weakness.  HEENT: headaches, sore throat, sneezing, nasal congestion, post nasal drip, difficulty swallowing, tooth/dental problems, visual complaints, visual changes, ear aches. Neuro: difficulty with speech, weakness, numbness, ataxia. CV:  chest pain, orthopnea, PND, swelling in lower extremities, dizziness, palpitations, syncope.  Resp: cough, hemoptysis, dyspnea, wheezing. GI: heartburn, indigestion, abdominal pain, nausea, vomiting, diarrhea, constipation, change in bowel habits, loss of appetite, hematemesis, melena, hematochezia.  GU: dysuria, change in color of urine, urgency or frequency, flank pain, hematuria. MSK: joint pain or swelling, decreased range of motion. Psych: change in mood or affect, depression, anxiety, suicidal ideations, homicidal ideations. Skin: rash, itching, bruising.    SUBJECTIVE:  Feels better after thoracentesis.  Had 700cc of serosanguinous fluid removed.  VITAL SIGNS: Temp:  [97.7 F (36.5 C)-98 F (36.7 C)] 98 F (36.7 C) (08/06 0449) Pulse Rate:  [59-75] 72 (08/06 0449) Resp:  [17-21] 20 (08/06 0449) BP: (132-153)/(83-115) 149/83 (08/06 1220) SpO2:  [93 %-98 %] 95 % (08/06 1220) Weight:  [129.3 kg (285 lb)] 129.3 kg (285 lb) (08/06 0449)  PHYSICAL EXAMINATION: General: Adult female, in NAD, visiting with husband. Neuro: A&O x 3, no deficits.  HEENT: [place] / AT. MMM. Cardiovascular: RRR, no M/R/G. Lungs: Diminished in bases R > L. Abdomen: Obese.  Ventral hernia that is easily reducible. Musculoskeletal: No deformities, no  edema. Skin: Warm, dry.    Recent Labs Lab 07/31/16 1703 08/06/16 2132  NA 137 139  K 3.7 4.0  CL 111 109  CO2  --  23  BUN 8 17  CREATININE 0.90 1.09*  GLUCOSE 96 109*    Recent Labs Lab 07/31/16 1703 08/06/16 2132  HGB 12.2 11.7*  HCT 36.0 36.5  WBC  --  10.2  PLT  --  323   Dg Chest 2 View  Result Date: 08/06/2016 CLINICAL DATA:  Acute onset of shortness of breath and lower back pain. Initial encounter. EXAM: CHEST  2 VIEW COMPARISON:  Chest radiograph performed 07/31/2016 FINDINGS: A small-to-moderate right-sided pleural effusion is noted, with right basilar airspace opacification, concerning for pneumonia. No pneumothorax is seen. The left lung appears clear. The heart is borderline normal in size. No acute osseous abnormalities are identified. IMPRESSION: Small to moderate right-sided pleural effusion, with right basilar airspace opacification, concerning for pneumonia. Electronically Signed   By: Garald Balding M.D.   On: 08/06/2016 23:15   Ct Chest Wo Contrast  Result Date: 08/07/2016 CLINICAL DATA:  Dyspnea and lower back pain EXAM: CT CHEST WITHOUT CONTRAST TECHNIQUE: Multidetector CT imaging of the chest was performed following the standard protocol without IV contrast. COMPARISON:  CXR exams dating back through 07/31/2016 FINDINGS: Cardiovascular: Normal size cardiac chambers without pericardial effusion. Mitral annular calcifications are noted. Minimal coronary arteriosclerosis along the distal LAD. 4.2 cm ascending aortic aneurysm. Dilatation of the main pulmonary artery 3.5 cm consistent with a component of pulmonary hypertension. Mediastinum/Nodes: Prevertebral masslike abnormality along the descending thoracic aorta and esophagus possibly representing enlarged lymph node mass. This is estimated at 7.7 x 4.7 x 8 cm in transverse by AP by craniocaudad dimension. Further assessment is limited due to lack of oral and IV contrast. Lungs/Pleura: Loculated right  moderate-sized pleural effusion with compressive atelectasis. Tiny subpleural 4 and 5 mm nodular densities are seen the left upper lobe with pleural-based 4 mm right upper lobe nodular density also noted. These are nonspecific. Upper Abdomen: Retrocrural adenopathy is seen on the left measuring 2.1 cm short axis. Musculoskeletal: Thoracolumbar spondylosis. No acute osseous abnormality. No lytic or blastic disease. IMPRESSION: Confluent prevertebral soft tissue abnormality along the descending thoracic aorta and esophagus. Findings are concerning for an enlarged lymph node mass given retrocrural adenopathy also seen. This confluent masslike abnormality measures 7.7 x 4.7 x 8 cm. Moderate loculated right-sided pleural effusion with compressive atelectasis. Some of the atelectasis could potentially be post obstructive due to the adjacent mediastinal mass. Bilateral 5 mm or less nodular densities in the upper lobes. No follow-up needed if patient is low-risk (and has no known or suspected primary neoplasm). Non-contrast chest CT can be considered in 12 months if patient is high-risk. This recommendation follows the consensus statement: Guidelines for Management of Incidental Pulmonary Nodules Detected on CT Images: From the Fleischner Society 2017; Radiology 2017; 284:228-243. Electronically Signed   By: Ashley Royalty M.D.   On: 08/07/2016 00:31    STUDIES:  CXR 8/6 > right pleural effusion. CT chest 8/6 >  moderate loculated right sided effusion with compressive atelectasis, confluent prevertebral soft tissue abnormality along the descending thoracic aorta and esophagus concerning for an enlarged lymph node mass, bilateral 36mm nodular densities in upper lobes.  SIGNIFICANT EVENTS  8/6 >  admit.  ASSESSMENT / PLAN:  Right sided pleural effusion - unclear etiology but given large lymphadenopathy with upper lobe nodules, malignancy is certainly on the differential.  She is now s/p thoracentesis by IR with 739ml  fluid obtained. Compressive atelectasis. Plan: Send pleural fluid for routine studies including LDH, protein, culture, cytology, flow cytometry. F/u on HIV. Incentive spirometry. F/u on echo. Mobilize as able. Bronchial hygiene. CXR in AM. If effusion turns out to be transudative, she can likely be discharged with outpatient follow up. If exudative (likely to be so), then would keep in hospital until cytology back at least.  Rest per primary team.  Montey Hora, [facility] Pager: [phone]  or [phone] 08/07/2016, 2:10 PM

## 2016-08-07 NOTE — Care Management Obs Status (Signed)
[place] NOTIFICATION   Patient Details  Name: Jo Collins MRN: [id number] Date of Birth: November 05, 1950   Medicare Observation Status Notification Given:  Yes    Marilu Favre, RN 08/07/2016, 9:33 AM

## 2016-08-07 NOTE — ED Provider Notes (Signed)
[facility] DEPT Provider Note   CSN: [id number] Arrival date & time: 08/06/16  2103     History   Chief Complaint Chief Complaint  Patient presents with  . Shortness of Breath    HPI Jo Collins is a 66 y.o. female.  Patient presents to the emergency department for evaluation of worsening shortness of breath. Patient reports that she was seen at urgent care and sent to the ER a week ago for pneumonia. At that time she had been noticing shortness of breath with a cough. Since she was seen in the ER and placed on doxycycline and albuterol, her symptoms have not improved. She is noticing progressively worsening shortness of breath and dyspnea on exertion. Symptoms worsened when she lies down at night to sleep.      History reviewed. No pertinent past medical history.  There are no active problems to display for this patient.   History reviewed. No pertinent surgical history.  OB History    No data available       Home Medications    Prior to Admission medications   Medication Sig Start Date End Date Taking? Authorizing Provider  doxycycline (VIBRAMYCIN) 100 MG capsule Take 1 capsule (100 mg total) by mouth 2 (two) times daily. 07/31/16   Deno Etienne, DO    Family History No family history on file.  Social History Social History  Substance Use Topics  . Smoking status: Former Smoker    Types: Cigarettes    Quit date: 07/28/2016  . Smokeless tobacco: Never Used  . Alcohol use No     Allergies   Patient has no known allergies.   Review of Systems Review of Systems  Respiratory: Positive for shortness of breath.   All other systems reviewed and are negative.    Physical Exam Updated Vital Signs BP (!) 153/95   Pulse 72   Temp 97.7 F (36.5 C) (Oral)   Resp 17   Ht 5' 1.5" (1.562 m)   Wt 129.3 kg (285 lb)   SpO2 97%   BMI 52.98 kg/m   Physical Exam  Constitutional: She is oriented to person, place, and time. She appears well-developed and  well-nourished. No distress.  HENT:  Head: Normocephalic and atraumatic.  Right Ear: Hearing normal.  Left Ear: Hearing normal.  Nose: Nose normal.  Mouth/Throat: Oropharynx is clear and moist and mucous membranes are normal.  Eyes: Pupils are equal, round, and reactive to light. Conjunctivae and EOM are normal.  Neck: Normal range of motion. Neck supple.  Cardiovascular: Regular rhythm, S1 normal and S2 normal.  Exam reveals no gallop and no friction rub.   No murmur heard. Pulmonary/Chest: Effort normal. No respiratory distress. She has decreased breath sounds in the right middle field and the right lower field. She exhibits no tenderness.  Abdominal: Soft. Normal appearance and bowel sounds are normal. There is no hepatosplenomegaly. There is no tenderness. There is no rebound, no guarding, no tenderness at McBurney's point and negative Murphy's sign. No hernia.  Musculoskeletal: Normal range of motion.  Neurological: She is alert and oriented to person, place, and time. She has normal strength. No cranial nerve deficit or sensory deficit. Coordination normal. GCS eye subscore is 4. GCS verbal subscore is 5. GCS motor subscore is 6.  Skin: Skin is warm, dry and intact. No rash noted. No cyanosis.  Psychiatric: She has a normal mood and affect. Her speech is normal and behavior is normal. Thought content normal.  Nursing note and vitals  reviewed.    ED Treatments / Results  Labs (all labs ordered are listed, but only abnormal results are displayed) Labs Reviewed  CBC WITH DIFFERENTIAL/PLATELET - Abnormal; Notable for the following:       Result Value   Hemoglobin 11.7 (*)    Eosinophils Absolute 1.3 (*)    All other components within normal limits  BASIC METABOLIC PANEL - Abnormal; Notable for the following:    Glucose, Bld 109 (*)    Creatinine, Ser 1.09 (*)    GFR calc non Af Amer 52 (*)    All other components within normal limits  CULTURE, BLOOD (ROUTINE X 2)  CULTURE, BLOOD  (ROUTINE X 2)  BRAIN NATRIURETIC PEPTIDE  I-STAT CG4 LACTIC ACID, ED    EKG  EKG Interpretation  Date/Time:  Sunday August 06 2016 21:21:16 EDT Ventricular Rate:  75 PR Interval:  150 QRS Duration: 84 QT Interval:  382 QTC Calculation: 426 R Axis:   23 Text Interpretation:  Normal sinus rhythm Septal infarct , age undetermined Abnormal ECG Confirmed by Orpah Greek [phone]) on 08/06/2016 11:51:08 PM       Radiology Dg Chest 2 View  Result Date: 08/06/2016 CLINICAL DATA:  Acute onset of shortness of breath and lower back pain. Initial encounter. EXAM: CHEST  2 VIEW COMPARISON:  Chest radiograph performed 07/31/2016 FINDINGS: A small-to-moderate right-sided pleural effusion is noted, with right basilar airspace opacification, concerning for pneumonia. No pneumothorax is seen. The left lung appears clear. The heart is borderline normal in size. No acute osseous abnormalities are identified. IMPRESSION: Small to moderate right-sided pleural effusion, with right basilar airspace opacification, concerning for pneumonia. Electronically Signed   By: Garald Balding M.D.   On: 08/06/2016 23:15   Ct Chest Wo Contrast  Result Date: 08/07/2016 CLINICAL DATA:  Dyspnea and lower back pain EXAM: CT CHEST WITHOUT CONTRAST TECHNIQUE: Multidetector CT imaging of the chest was performed following the standard protocol without IV contrast. COMPARISON:  CXR exams dating back through 07/31/2016 FINDINGS: Cardiovascular: Normal size cardiac chambers without pericardial effusion. Mitral annular calcifications are noted. Minimal coronary arteriosclerosis along the distal LAD. 4.2 cm ascending aortic aneurysm. Dilatation of the main pulmonary artery 3.5 cm consistent with a component of pulmonary hypertension. Mediastinum/Nodes: Prevertebral masslike abnormality along the descending thoracic aorta and esophagus possibly representing enlarged lymph node mass. This is estimated at 7.7 x 4.7 x 8 cm in transverse  by AP by craniocaudad dimension. Further assessment is limited due to lack of oral and IV contrast. Lungs/Pleura: Loculated right moderate-sized pleural effusion with compressive atelectasis. Tiny subpleural 4 and 5 mm nodular densities are seen the left upper lobe with pleural-based 4 mm right upper lobe nodular density also noted. These are nonspecific. Upper Abdomen: Retrocrural adenopathy is seen on the left measuring 2.1 cm short axis. Musculoskeletal: Thoracolumbar spondylosis. No acute osseous abnormality. No lytic or blastic disease. IMPRESSION: Confluent prevertebral soft tissue abnormality along the descending thoracic aorta and esophagus. Findings are concerning for an enlarged lymph node mass given retrocrural adenopathy also seen. This confluent masslike abnormality measures 7.7 x 4.7 x 8 cm. Moderate loculated right-sided pleural effusion with compressive atelectasis. Some of the atelectasis could potentially be post obstructive due to the adjacent mediastinal mass. Bilateral 5 mm or less nodular densities in the upper lobes. No follow-up needed if patient is low-risk (and has no known or suspected primary neoplasm). Non-contrast chest CT can be considered in 12 months if patient is high-risk. This recommendation follows the  consensus statement: Guidelines for Management of Incidental Pulmonary Nodules Detected on CT Images: From the Fleischner Society 2017; Radiology 2017; 284:228-243. Electronically Signed   By: Ashley Royalty M.D.   On: 08/07/2016 00:31    Procedures Procedures (including critical care time)  Medications Ordered in ED Medications  oxyCODONE-acetaminophen (PERCOCET/ROXICET) 5-325 MG per tablet 1 tablet (not administered)     Initial Impression / Assessment and Plan / ED Course  I have reviewed the triage vital signs and the nursing notes.  Pertinent labs & imaging results that were available during my care of the patient were reviewed by me and considered in my medical  decision making (see chart for details).     Patient presented with complaints of shortness of breath. Patient treated one week ago for community-acquired pneumonia, has worsening symptoms of shortness of breath. She reports inability to sleep at night because she cannot lie flat. She has been on doxycycline and albuterol for the past week without improvement.  X-ray today shows worsening opacity of the right lower lung field consistent with pleural effusion. CT scan confirms loculated effusion without obvious pneumonia. Concern for prevertebral mass on CT. Discussed with Dr. Halford Chessman, on-call for pulmonology. Recommends admission to the hospitalist service, patient will be seen by pulmonology in the morning. Recommends arrangement for thoracentesis in the morning, will make decision on bronchoscopy after evaluation.  Final Clinical Impressions(s) / ED Diagnoses   Final diagnoses:  Pleural effusion    New Prescriptions New Prescriptions   No medications on file     Orpah Greek, MD 08/07/16 [phone]

## 2016-08-07 NOTE — H&P (Signed)
History and Physical    Jo Collins CXK:[id number] DOB: 07/22/1950 DOA: 08/06/2016  PCP: Deland Pretty, MD   Patient coming from:  Home.  I have personally briefly reviewed patient's old medical records in [place]  Chief Complaint: Shortness of breath.  HPI: Jo Collins is a 66 y.o. female with medical history significant of GERD who returns to emergency department with complaints of progressively worse dry cough, dyspnea, orthopnea and now having back pain. She was seen about a week ago for these symptoms and was sent home with doxycycline 100 mg by mouth twice a day and albuterol inhaler. She mentions that this has not helped much. Denies fever, but complains of fatigue, chills, malaise for the past few days and decreased appetite for the past month and a half. She denies chest pain, palpitations, diaphoresis, pitting edema of the lower extremities, but complains of postural dizziness and orthopnea. She complains of abdominal pain after taking doxycycline with mild transient nausea (relieved by oral intake), but denies vomiting, diarrhea, constipation, melena or hematochezia. She denies dysuria, frequency, hematuria, blurred vision, polyuria, polydipsia or decreased urinary output.  ED Course: Her initial vital signs temperature 97.52F, pulse 75, blood pressure 140 over 1 50 mmHg, respirations 18 and O2 sat 98% on room air. Workup in the emergency department shows a normal CBC, except for mildly decreased hemoglobin of 11.7 g/dL and total use and no fills out 1.3. She had normal chemistry except for albumin of 3.1 g/dL, mildly increased glucose at 109 and creatinine 1.09 mg/dL. She had a normal lactic acid and BNP levels.  Imaging: Chest radiograph showed a right pleural effusion. CT chest without contrast showed a confluent prevertebral soft tissue abnormality along the descending thoracic aorta and esophagus 7.7 x 4.7 x 8 cm. Positive retrocrural adenopathy. Bilateral upper lobes  nodules.  Review of Systems: As per HPI otherwise 10 point review of systems negative.    Past Medical History:  Diagnosis Date  . GERD (gastroesophageal reflux disease)     Past Surgical History:  Procedure Laterality Date  . ABDOMINAL HYSTERECTOMY       reports that she quit smoking 10 days ago. Her smoking use included Cigarettes. She has a 23.50 pack-year smoking history. She has never used smokeless tobacco. She reports that she does not drink alcohol. Her drug history is not on file.  No Known Allergies  Family History  Problem Relation Age of Onset  . COPD Mother   . Diabetes Mellitus II Sister   . Diabetes Mellitus II Brother   . Diabetes Mellitus II Sister   . Stroke Sister   . Hypertension Sister   . Diabetes Mellitus II Sister     Prior to Admission medications   Medication Sig Start Date End Date Taking? Authorizing Provider  doxycycline (VIBRAMYCIN) 100 MG capsule Take 1 capsule (100 mg total) by mouth 2 (two) times daily. 07/31/16   Deno Etienne, DO    Physical Exam: Vitals:   08/06/16 2120 08/07/16 0145 08/07/16 0300 08/07/16 0449  BP: (!) 140/115 (!) 153/95 132/86 (!) 153/87  Pulse: 75 72 (!) 59   Resp: 18 17 (!) 21   Temp: 97.7 F (36.5 C)     TempSrc: Oral     SpO2: 98% 97% 93%   Weight: 129.3 kg (285 lb)     Height: 5' 1.5" (1.562 m)       Constitutional: NAD, calm, comfortable Eyes: PERRL, lids and conjunctivae normal ENMT: Mucous membranes are moist. Posterior pharynx  clear of any exudate or lesions. Neck: normal, supple, no masses, no thyromegaly Respiratory: Decreased breath sounds on right base, no wheezing, no crackles. Normal respiratory effort. No accessory muscle use.  Cardiovascular: Regular rate and rhythm, no murmurs / rubs / gallops. No extremity edema. 2+ pedal pulses. No carotid bruits.  Abdomen: Soft, no tenderness, no masses palpated. No hepatosplenomegaly. Bowel sounds positive.  Musculoskeletal: no clubbing / cyanosis.  Good  ROM, no contractures. Normal muscle tone.  Skin: no significant rashes, lesions, ulcers on limited skin exam. Neurologic: CN 2-12 grossly intact. Sensation intact, DTR normal. Strength 5/5 in all 4.  Psychiatric: Normal judgment and insight. Alert and oriented x 4. Normal mood.    Labs on Admission: I have personally reviewed following labs and imaging studies  CBC:  Recent Labs Lab 07/31/16 1703 08/06/16 2132  WBC  --  10.2  NEUTROABS  --  6.6  HGB 12.2 11.7*  HCT 36.0 36.5  MCV  --  84.1  PLT  --  989   Basic Metabolic Panel:  Recent Labs Lab 07/31/16 1703 08/06/16 2132  NA 137 139  K 3.7 4.0  CL 111 109  CO2  --  23  GLUCOSE 96 109*  BUN 8 17  CREATININE 0.90 1.09*  CALCIUM  --  9.1   GFR: Estimated Creatinine Clearance: 65.9 mL/min (A) (by C-G formula based on SCr of 1.09 mg/dL (H)). Liver Function Tests: No results for input(s): AST, ALT, ALKPHOS, BILITOT, PROT, ALBUMIN in the last 168 hours. No results for input(s): LIPASE, AMYLASE in the last 168 hours. No results for input(s): AMMONIA in the last 168 hours. Coagulation Profile: No results for input(s): INR, PROTIME in the last 168 hours. Cardiac Enzymes: No results for input(s): CKTOTAL, CKMB, CKMBINDEX, TROPONINI in the last 168 hours. BNP (last 3 results) No results for input(s): PROBNP in the last 8760 hours. HbA1C: No results for input(s): HGBA1C in the last 72 hours. CBG: No results for input(s): GLUCAP in the last 168 hours. Lipid Profile: No results for input(s): CHOL, HDL, LDLCALC, TRIG, CHOLHDL, LDLDIRECT in the last 72 hours. Thyroid Function Tests: No results for input(s): TSH, T4TOTAL, FREET4, T3FREE, THYROIDAB in the last 72 hours. Anemia Panel: No results for input(s): VITAMINB12, FOLATE, FERRITIN, TIBC, IRON, RETICCTPCT in the last 72 hours. Urine analysis: No results found for: COLORURINE, APPEARANCEUR, LABSPEC, PHURINE, GLUCOSEU, HGBUR, BILIRUBINUR, KETONESUR, PROTEINUR,  UROBILINOGEN, NITRITE, LEUKOCYTESUR  Radiological Exams on Admission: Dg Chest 2 View  Result Date: 08/06/2016 CLINICAL DATA:  Acute onset of shortness of breath and lower back pain. Initial encounter. EXAM: CHEST  2 VIEW COMPARISON:  Chest radiograph performed 07/31/2016 FINDINGS: A small-to-moderate right-sided pleural effusion is noted, with right basilar airspace opacification, concerning for pneumonia. No pneumothorax is seen. The left lung appears clear. The heart is borderline normal in size. No acute osseous abnormalities are identified. IMPRESSION: Small to moderate right-sided pleural effusion, with right basilar airspace opacification, concerning for pneumonia. Electronically Signed   By: Garald Balding M.D.   On: 08/06/2016 23:15   Ct Chest Wo Contrast  Result Date: 08/07/2016 CLINICAL DATA:  Dyspnea and lower back pain EXAM: CT CHEST WITHOUT CONTRAST TECHNIQUE: Multidetector CT imaging of the chest was performed following the standard protocol without IV contrast. COMPARISON:  CXR exams dating back through 07/31/2016 FINDINGS: Cardiovascular: Normal size cardiac chambers without pericardial effusion. Mitral annular calcifications are noted. Minimal coronary arteriosclerosis along the distal LAD. 4.2 cm ascending aortic aneurysm. Dilatation of the main pulmonary  artery 3.5 cm consistent with a component of pulmonary hypertension. Mediastinum/Nodes: Prevertebral masslike abnormality along the descending thoracic aorta and esophagus possibly representing enlarged lymph node mass. This is estimated at 7.7 x 4.7 x 8 cm in transverse by AP by craniocaudad dimension. Further assessment is limited due to lack of oral and IV contrast. Lungs/Pleura: Loculated right moderate-sized pleural effusion with compressive atelectasis. Tiny subpleural 4 and 5 mm nodular densities are seen the left upper lobe with pleural-based 4 mm right upper lobe nodular density also noted. These are nonspecific. Upper Abdomen:  Retrocrural adenopathy is seen on the left measuring 2.1 cm short axis. Musculoskeletal: Thoracolumbar spondylosis. No acute osseous abnormality. No lytic or blastic disease. IMPRESSION: Confluent prevertebral soft tissue abnormality along the descending thoracic aorta and esophagus. Findings are concerning for an enlarged lymph node mass given retrocrural adenopathy also seen. This confluent masslike abnormality measures 7.7 x 4.7 x 8 cm. Moderate loculated right-sided pleural effusion with compressive atelectasis. Some of the atelectasis could potentially be post obstructive due to the adjacent mediastinal mass. Bilateral 5 mm or less nodular densities in the upper lobes. No follow-up needed if patient is low-risk (and has no known or suspected primary neoplasm). Non-contrast chest CT can be considered in 12 months if patient is high-risk. This recommendation follows the consensus statement: Guidelines for Management of Incidental Pulmonary Nodules Detected on CT Images: From the Fleischner Society 2017; Radiology 2017; 284:228-243. Electronically Signed   By: Ashley Royalty M.D.   On: 08/07/2016 00:31    EKG: Independently reviewed.  Vent. rate 75 BPM PR interval 150 ms QRS duration 84 ms QT/QTc 382/426 ms P-R-T axes 16 23 65 Normal sinus rhythm Septal infarct , age undetermined Abnormal ECG  Assessment/Plan Principal Problem:   Pleural effusion on right Admit to MedSurg/observation. Continue supplemental oxygen. AM IR consultation for thoracentesis and pleural fluid analysis. Bronchodilators as needed.  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 Given her history of tobacco use, malignancy should be ruled out. Pulmonary consult was requested by the ED. Per pulmonary, they will evaluate later today.    Abnormal EKG Check echocardiogram.    Anemia Monitor hematocrit and hemoglobin.    GERD (gastroesophageal reflux disease) Protonix 40 mg by mouth  daily.    DVT prophylaxis: SCDs. Code Status: Full code. Family Communication:  Disposition Plan: Admit for thoracentesis and further workup. Consults called: PCCM was consulted by ED and will evaluate today. Dr. Arne Cleveland from IR was consulted for thoracentesis. Admission status: Observation/telemetry.   Reubin Milan MD [facility] Pager [phone].  If 7PM-7AM, please contact night-coverage [email or web address] Password [email or web address]  08/07/2016, 5:11 AM

## 2016-08-07 NOTE — Procedures (Signed)
Ultrasound-guided diagnostic and therapeutic right thoracentesis performed yielding 0.7 liters of serosanguineous colored fluid. No immediate complications. Follow-up chest x-ray pending.       Jo Collins E 1:02 PM 08/07/2016

## 2016-08-07 NOTE — Progress Notes (Signed)
Patient seen and examined  66 y.o. female with medical history significant of GERD who returns to emergency department with complaints of progressively worse dry cough, dyspnea, orthopnea and now having back pain. She was seen about a week ago for these symptoms and was sent home with doxycycline 100 mg by mouth twice a day and albuterol inhaler. She mentions that this has not helped much. CT scan done in the ED showed  confluent prevertebral soft tissue abnormality along the descending thoracic aorta and esophagus 7.7 x 4.7 x 8 cm. Positive retrocrural adenopathy. Bilateral upper lobes nodules.  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 Given her history of tobacco use, malignancy needs to be ruled out, will order pan CT Pulmonary consult was requested by the ED. Per pulmonary, they will evaluate later today.    Abnormal EKG Check echocardiogram.    Anemia Monitor hematocrit and hemoglobin.    GERD (gastroesophageal reflux disease) Protonix 40 mg by mouth daily.

## 2016-08-08 ENCOUNTER — Encounter (HOSPITAL_COMMUNITY): Payer: Self-pay | Admitting: Radiology

## 2016-08-08 ENCOUNTER — Inpatient Hospital Stay (HOSPITAL_COMMUNITY): Payer: Medicare Other

## 2016-08-08 ENCOUNTER — Other Ambulatory Visit: Payer: Self-pay

## 2016-08-08 DIAGNOSIS — J90 Pleural effusion, not elsewhere classified: Secondary | ICD-10-CM

## 2016-08-08 DIAGNOSIS — K219 Gastro-esophageal reflux disease without esophagitis: Secondary | ICD-10-CM

## 2016-08-08 LAB — CBC WITH DIFFERENTIAL/PLATELET
BASOS ABS: 0 10*3/uL (ref 0.0–0.1)
Basophils Relative: 0 %
EOS ABS: 1 10*3/uL — AB (ref 0.0–0.7)
Eosinophils Relative: 11 %
HCT: 35.4 % — ABNORMAL LOW (ref 36.0–46.0)
HEMOGLOBIN: 11.3 g/dL — AB (ref 12.0–15.0)
LYMPHS ABS: 1.3 10*3/uL (ref 0.7–4.0)
LYMPHS PCT: 14 %
MCH: 26.9 pg (ref 26.0–34.0)
MCHC: 31.9 g/dL (ref 30.0–36.0)
MCV: 84.3 fL (ref 78.0–100.0)
Monocytes Absolute: 0.5 10*3/uL (ref 0.1–1.0)
Monocytes Relative: 6 %
NEUTROS PCT: 69 %
Neutro Abs: 6.2 10*3/uL (ref 1.7–7.7)
PLATELETS: 318 10*3/uL (ref 150–400)
RBC: 4.2 MIL/uL (ref 3.87–5.11)
RDW: 13.6 % (ref 11.5–15.5)
WBC: 9.1 10*3/uL (ref 4.0–10.5)

## 2016-08-08 LAB — ECHOCARDIOGRAM COMPLETE
HEIGHTINCHES: 61.5 in
WEIGHTICAEL: 4560 [oz_av]

## 2016-08-08 LAB — BASIC METABOLIC PANEL
Anion gap: 10 (ref 5–15)
BUN: 15 mg/dL (ref 6–20)
CHLORIDE: 107 mmol/L (ref 101–111)
CO2: 22 mmol/L (ref 22–32)
CREATININE: 0.92 mg/dL (ref 0.44–1.00)
Calcium: 8.7 mg/dL — ABNORMAL LOW (ref 8.9–10.3)
Glucose, Bld: 105 mg/dL — ABNORMAL HIGH (ref 65–99)
Potassium: 4.2 mmol/L (ref 3.5–5.1)
SODIUM: 139 mmol/L (ref 135–145)

## 2016-08-08 LAB — HIV ANTIBODY (ROUTINE TESTING W REFLEX): HIV Screen 4th Generation wRfx: NONREACTIVE

## 2016-08-08 LAB — ACID FAST SMEAR (AFB): ACID FAST SMEAR - AFSCU2: NEGATIVE

## 2016-08-08 LAB — ACID FAST SMEAR (AFB, MYCOBACTERIA)

## 2016-08-08 MED ORDER — HEPARIN SODIUM (PORCINE) 5000 UNIT/ML IJ SOLN
5000.0000 [IU] | Freq: Three times a day (TID) | INTRAMUSCULAR | Status: DC
  Administered 2016-08-10 – 2016-08-14 (×14): 5000 [IU] via SUBCUTANEOUS
  Filled 2016-08-08 (×15): qty 1

## 2016-08-08 MED ORDER — HEPARIN SODIUM (PORCINE) 5000 UNIT/ML IJ SOLN: 5000.0000 [IU] | Freq: Three times a day (TID) | INTRAMUSCULAR | Status: DC

## 2016-08-08 MED ORDER — IOPAMIDOL (ISOVUE-300) INJECTION 61%
100.0000 mL | Freq: Once | INTRAVENOUS | Status: AC | PRN
  Administered 2016-08-08: 100 mL via INTRAVENOUS

## 2016-08-08 NOTE — Progress Notes (Signed)
[facility] PROGRESS NOTE  Jo Collins UQJ:[id number] DOB: 09/12/1950 DOA: 08/06/2016   PCP: Deland Pretty, MD     Assessment/Plan: Principal Problem:   Pleural effusion on right Active Problems:   Abnormal EKG   Intrathoracic mass   Anemia   GERD (gastroesophageal reflux disease)   Shortness of breath  66 y.o.femalewith medical history significant of GERD who returns to emergency department with complaints of progressively worse dry cough, dyspnea, orthopnea and now having back pain. She was seen about a week ago for these symptoms and was sent home with doxycycline 100 mg by mouth twice a day and albuterol inhaler. She mentions that this has not helped much. CT scan done in the ED showed  confluent prevertebral soft tissue abnormality along the descending thoracic aorta and esophagus 7.7 x 4.7 x 8 cm.  patient is status post thoracentesis of the right-sided pleural effusion by IR. 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 Atelectasis, no centrally obstructing mass noted. Concern for small cell primary lung neoplasm versus lymphoma IR consulted to see if any area of the amenable to biopsy Abdominal CT shows retroperitoneal lymphadenopathy, right external iliac lymph node, right inguinal lymphadenopathy may be amenable to biopsy     Abnormal EKG-Q waves V1-V3 2-D echo pending   Anemia Hemoglobin 11.3,  stable  GERD (gastroesophageal reflux disease) Protonix 40 mg by mouth daily    DVT prophylaxsis heparin  Code Status:  Full code   Family Communication: Discussed in detail with the patient and husband , all imaging results, lab results explained to the patient   Disposition Plan:  Pending diagnostic workup       Consultants:  Critical care  IR  Procedures:   Ultrasound-guided diagnostic and therapeutic right thoracentesis  8/6  Antibiotics: Anti-infectives    None         HPI/Subjective: Sob improved after thoracentesis   Objective: Vitals:   08/07/16 1247 08/07/16 1419 08/07/16 2058 08/08/16 0402  BP: (!) 138/95 139/77 120/62 (!) 188/50  Pulse:  62 97 88  Resp:  16 19 19   Temp:  (!) 97.4 F (36.3 C) 98.1 F (36.7 C) 98.1 F (36.7 C)  TempSrc:  Oral Oral Oral  SpO2: 97% 92% 94% 95%  Weight:      Height:        Intake/Output Summary (Last 24 hours) at 08/08/16 3893 Last data filed at 08/08/16 0600  Gross per 24 hour  Intake             2007 ml  Output                0 ml  Net             2007 ml    Exam:  Examination:  General exam: Appears calm and comfortable  Respiratory system: Clear to auscultation. Respiratory effort normal. Cardiovascular system: S1 & S2 heard, RRR. No JVD, murmurs, rubs, gallops or clicks. No pedal edema. Gastrointestinal system: Abdomen is nondistended, soft and nontender. No organomegaly or masses felt. Normal bowel sounds heard.  Central nervous system: Alert and oriented. No focal neurological deficits. Extremities: Symmetric 5 x 5 power. Skin: No rashes, lesions or ulcers Psychiatry: Judgement and insight appear normal. Mood & affect appropriate.     Data Reviewed: I have personally reviewed following labs and imaging studies  Micro Results Recent Results (from the past 240 hour(s))  Gram stain     Status: None   Collection Time: 08/07/16 12:36 PM  Result Value Ref Range Status   Specimen  Description PLEURAL RIGHT  Final   Special Requests NONE  Final   Gram Stain   Final    ABUNDANT WBC PRESENT,BOTH PMN AND MONONUCLEAR NO ORGANISMS SEEN    Report Status 08/07/2016 FINAL  Final    Radiology Reports Dg Chest 1 View  Result Date: 08/07/2016 CLINICAL DATA:  Status post right thoracentesis. EXAM: CHEST 1 VIEW COMPARISON:  08/06/2016 chest radiograph. FINDINGS: Stable cardiomediastinal silhouette with normal heart size. No pneumothorax. Small right pleural effusion, decreased. No left pleural effusion. No pulmonary edema. Patchy right lung base opacity is mildly decreased. No new consolidative airspace disease. IMPRESSION: 1. No pneumothorax. 2. Small right pleural effusion, decreased . 3. Nonspecific patchy right lung base opacity, decreased. Electronically Signed   By: Ilona Sorrel M.D.   On: 08/07/2016 13:23   Dg Chest 2 View  Result Date: 08/06/2016 CLINICAL DATA:  Acute onset of shortness of breath and lower back pain. Initial encounter. EXAM: CHEST  2 VIEW COMPARISON:  Chest radiograph performed 07/31/2016 FINDINGS: A small-to-moderate right-sided pleural effusion is noted, with right basilar airspace opacification, concerning for pneumonia. No pneumothorax is seen. The left lung appears clear. The heart is borderline normal in size. No acute osseous abnormalities are identified. IMPRESSION: Small to moderate right-sided pleural effusion, with right basilar airspace opacification, concerning for pneumonia. Electronically Signed   By: Garald Balding M.D.   On: 08/06/2016 23:15   Dg Chest 2 View  Result Date: 07/31/2016 CLINICAL DATA:  Three days of nonproductive cough associated with shortness of breath and wheezing. Current smoker. EXAM: CHEST  2 VIEW COMPARISON:  Report of a chest x-ray of May 28, 2001. FINDINGS: The left lung is clear. On the right there is volume loss with obscuration of the hemidiaphragm. There is fluid in the minor fissure. There is no mediastinal shift. The  heart and pulmonary vascularity are normal. There is mild tortuosity of the ascending thoracic aorta. The bony thorax exhibits no acute abnormality. IMPRESSION: Atelectasis or pneumonia with right pleural E fusion. Followup PA and lateral chest X-ray is recommended in 3-4 weeks following trial of antibiotic therapy to ensure resolution and exclude underlying malignancy. Electronically Signed   By: David  Martinique M.D.   On: 07/31/2016 16:50   Ct Chest Wo Contrast  Result Date: 08/07/2016 CLINICAL DATA:  Dyspnea and lower back pain EXAM: CT CHEST WITHOUT CONTRAST TECHNIQUE: Multidetector CT imaging of the chest was performed following the standard protocol without IV contrast. COMPARISON:  CXR exams dating back through 07/31/2016 FINDINGS: Cardiovascular: Normal size cardiac chambers without pericardial effusion. Mitral annular calcifications are noted. Minimal coronary arteriosclerosis along the distal LAD. 4.2 cm ascending aortic aneurysm. Dilatation of the main pulmonary artery 3.5 cm consistent with a component of pulmonary hypertension. Mediastinum/Nodes: Prevertebral masslike abnormality along the descending thoracic aorta and esophagus possibly representing enlarged lymph node mass. This is estimated at 7.7 x 4.7 x 8 cm in transverse by AP by craniocaudad dimension. Further assessment is limited due to lack of oral and IV contrast.  Lungs/Pleura: Loculated right moderate-sized pleural effusion with compressive atelectasis. Tiny subpleural 4 and 5 mm nodular densities are seen the left upper lobe with pleural-based 4 mm right upper lobe nodular density also noted. These are nonspecific. Upper Abdomen: Retrocrural adenopathy is seen on the left measuring 2.1 cm short axis. Musculoskeletal: Thoracolumbar spondylosis. No acute osseous abnormality. No lytic or blastic disease. IMPRESSION: Confluent prevertebral soft tissue abnormality along the descending thoracic aorta and esophagus. Findings are concerning for an  enlarged lymph node mass given retrocrural adenopathy also seen. This confluent masslike abnormality measures 7.7 x 4.7 x 8 cm. Moderate loculated right-sided pleural effusion with compressive atelectasis. Some of the atelectasis could potentially be post obstructive due to the adjacent mediastinal mass. Bilateral 5 mm or less nodular densities in the upper lobes. No follow-up needed if patient is low-risk (and has no known or suspected primary neoplasm). Non-contrast chest CT can be considered in 12 months if patient is high-risk. This recommendation follows the consensus statement: Guidelines for Management of Incidental Pulmonary Nodules Detected on CT Images: From the Fleischner Society 2017; Radiology 2017; 284:228-243. Electronically Signed   By: Ashley Royalty M.D.   On: 08/07/2016 00:31   Ct Chest W Contrast  Result Date: 08/08/2016 CLINICAL DATA:  66 year old female with mediastinal adenopathy and shortness breath. Post thoracentesis. Cytology pending. Recently quit smoking. Evaluate for metastatic disease. Post abdominal hysterectomy. Subsequent encounter. EXAM: CT CHEST, ABDOMEN, AND PELVIS WITH CONTRAST TECHNIQUE: Multidetector CT imaging of the chest, abdomen and pelvis was performed following the standard protocol during bolus administration of intravenous contrast. CONTRAST:  169mL ISOVUE-300 IOPAMIDOL (ISOVUE-300) INJECTION 61% COMPARISON:  08/07/2016 chest x-ray and chest CT. 05/24/2005 CT of the abdomen and pelvis. FINDINGS: CT CHEST FINDINGS Cardiovascular: Heart size within normal limits. Prominent mitral valve calcifications. Trace coronary artery calcifications. Prominent ascending thoracic aorta measuring up to 4 cm transverse dimension. Exam not tailored to evaluate for pulmonary embolus. No central pulmonary embolus noted. Main pulmonary artery prominent measuring 3.6 cm which may reflect component of pulmonary hypertension. Mediastinum/Nodes: Paravertebral mass envelops vertebra and  descending thoracic aorta (displacing and compressing the esophagus anteriorly) suspicious for confluent adenopathy with maximal transverse dimension of 8.8 x 8.6 cm spanning from T4 -T12. Question epidural extension of tumor versus engorged epidural veins. Contrast-enhanced MR can be obtained for further delineation of clinically desired. Lungs/Pleura: Partially loculated right-sided pleural effusion. Pleural extension of tumor posterior inferior right thorax (series 3, images 30 through 36). Confluent atelectasis with air bronchograms right lower lobe right middle lobe. Less confluent atelectasis inferior right upper lobe. No central obstructing mass identified. Tethered pleura with sub pleural thickening greatest lung base. Multiple tiny bilateral nodules throughout all lung zones. Accessory fissure left upper lobe incidentally noted. Trachea and mainstem bronchi are patent. Musculoskeletal: Although thoracic vertebra surrounded by mass, no obvious destruction. Degenerative changes throughout the lower cervical and thoracic spine. CT ABDOMEN PELVIS FINDINGS Hepatobiliary: Significantly enlarged liver spanning over 25.6 cm without focal hepatic lesion noted. No calcified gallstones. Pancreas: No primary pancreatic mass or inflammation. Spleen: No splenic mass or enlargement. Adrenals/Urinary Tract: No hydronephrosis. Left lower pole 1 cm low-density structure statistically likely a cyst. No adrenal mass. Noncontrast filled views of the urinary bladder with limited evaluation secondary to habitus/ artifact. It would be difficult on the present examination to exclude a primary bladder abnormality. Stomach/Bowel: Diverticulosis with regions of muscular hypertrophy. No extraluminal bowel inflammatory process. Limited for evaluating for possibility of bowel/ stomach primary mass secondary to under distension. No  obvious abnormality noted. Vascular/Lymphatic: Retroperitoneal, upper abdominal, mesenteric, pelvic and  right inguinal adenopathy. Index lymph node right external iliac region measures 5.1 x 3.1 cm. Right inguinal adenopathy which may be assessable to biopsy measures 2.7 x 2 x 2.7 cm. Trace abdominal aortic calcification without aneurysm. No large arterial vessel occlusion. Reproductive: Post hysterectomy.  No obvious adnexal mass. Other: No free intraperitoneal air. Injection granulomas and subcutaneous gas from injection. Musculoskeletal: No destructive lesion identified. Degenerative changes most prominent L4-5 level with facet degenerative changes, mild anterior slip L4 and bulge contribute to multifactorial spinal stenosis. 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 Question epidural extension of tumor versus engorged epidural veins. Contrast-enhanced MR can be obtained for further delineation of clinically desired. Abdominal region retroperitoneal upper abdominal, mesenteric, pelvic and right inguinal adenopathy. Index lymph node right external iliac region measures 5.1 x 3.1 cm. Right inguinal adenopathy which may be assessable to biopsy measures 2.7 x 2 x 2.7 cm. Partially loculated right-sided pleural effusion. Pleural extension of tumor posterior inferior right thorax (series 3, images 30 through 36). Confluent atelectasis with air bronchograms right lower lobe right middle lobe. Less confluent atelectasis inferior right upper lobe. No central obstructing mass identified. Tethered pleura with sub pleural thickening greatest lung base. Multiple tiny bilateral nodules throughout all lung zones. Findings may reflect tumor such as small cell primary lung neoplasm in this smoker. Adenopathy from lymphoma is a possibility. Adenopathy and spread of tumor to lung from other primary tumor not excluded although other  primary lesion not identified (evaluation of bowel limited by under distension without obvious mass). Significantly enlarged liver spanning over 25.6 cm. Aortic Atherosclerosis (ICD10-I70.0). Prominent ascending thoracic aorta measuring up to 4 cm transverse dimension. Recommend annual imaging followup by CTA or MRA. This recommendation follows 2010 ACCF/AHA/AATS/ACR/ASA/SCA/SCAI/SIR/STS/SVM Guidelines for the Diagnosis and Management of Patients with Thoracic Aortic Disease. Circulation. 2010; 121: Y073-X106 Exam not tailored to evaluate for pulmonary embolus. No central pulmonary embolus noted. Main pulmonary artery prominent measuring 3.6 cm which may reflect component of pulmonary hypertension. Electronically Signed   By: Genia Del M.D.   On: 08/08/2016 07:45   Ct Abdomen Pelvis W Contrast  Result Date: 08/08/2016 CLINICAL DATA:  66 year old female with mediastinal adenopathy and shortness breath. Post thoracentesis. Cytology pending. Recently quit smoking. Evaluate for metastatic disease. Post abdominal hysterectomy. Subsequent encounter. EXAM: CT CHEST, ABDOMEN, AND PELVIS WITH CONTRAST TECHNIQUE: Multidetector CT imaging of the chest, abdomen and pelvis was performed following the standard protocol during bolus administration of intravenous contrast. CONTRAST:  145mL ISOVUE-300 IOPAMIDOL (ISOVUE-300) INJECTION 61% COMPARISON:  08/07/2016 chest x-ray and chest CT. 05/24/2005 CT of the abdomen and pelvis. FINDINGS: CT CHEST FINDINGS Cardiovascular: Heart size within normal limits. Prominent mitral valve calcifications. Trace coronary artery calcifications. Prominent ascending thoracic aorta measuring up to 4 cm transverse dimension. Exam not tailored to evaluate for pulmonary embolus. No central pulmonary embolus noted. Main pulmonary artery prominent measuring 3.6 cm which may reflect component of pulmonary hypertension. Mediastinum/Nodes: Paravertebral mass envelops vertebra and descending thoracic  aorta (displacing and compressing the esophagus anteriorly) suspicious for confluent adenopathy with maximal transverse dimension of 8.8 x 8.6 cm spanning from T4 -T12. Question epidural extension of tumor versus engorged epidural veins. Contrast-enhanced MR can be obtained for further delineation of clinically desired. Lungs/Pleura: Partially loculated right-sided pleural effusion. Pleural extension of tumor  posterior inferior right thorax (series 3, images 30 through 36). Confluent atelectasis with air bronchograms right lower lobe right middle lobe. Less confluent atelectasis inferior right upper lobe. No central obstructing mass identified. Tethered pleura with sub pleural thickening greatest lung base. Multiple tiny bilateral nodules throughout all lung zones. Accessory fissure left upper lobe incidentally noted. Trachea and mainstem bronchi are patent. Musculoskeletal: Although thoracic vertebra surrounded by mass, no obvious destruction. Degenerative changes throughout the lower cervical and thoracic spine. CT ABDOMEN PELVIS FINDINGS Hepatobiliary: Significantly enlarged liver spanning over 25.6 cm without focal hepatic lesion noted. No calcified gallstones. Pancreas: No primary pancreatic mass or inflammation. Spleen: No splenic mass or enlargement. Adrenals/Urinary Tract: No hydronephrosis. Left lower pole 1 cm low-density structure statistically likely a cyst. No adrenal mass. Noncontrast filled views of the urinary bladder with limited evaluation secondary to habitus/ artifact. It would be difficult on the present examination to exclude a primary bladder abnormality. Stomach/Bowel: Diverticulosis with regions of muscular hypertrophy. No extraluminal bowel inflammatory process. Limited for evaluating for possibility of bowel/ stomach primary mass secondary to under distension. No obvious abnormality noted. Vascular/Lymphatic: Retroperitoneal, upper abdominal, mesenteric, pelvic and right inguinal  adenopathy. Index lymph node right external iliac region measures 5.1 x 3.1 cm. Right inguinal adenopathy which may be assessable to biopsy measures 2.7 x 2 x 2.7 cm. Trace abdominal aortic calcification without aneurysm. No large arterial vessel occlusion. Reproductive: Post hysterectomy.  No obvious adnexal mass. Other: No free intraperitoneal air. Injection granulomas and subcutaneous gas from injection. Musculoskeletal: No destructive lesion identified. Degenerative changes most prominent L4-5 level with facet degenerative changes, mild anterior slip L4 and bulge contribute to multifactorial spinal stenosis. 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 Question epidural extension of tumor versus engorged epidural veins. Contrast-enhanced MR can be obtained for further delineation of clinically desired. Abdominal region retroperitoneal upper abdominal, mesenteric, pelvic and right inguinal adenopathy. Index lymph node right external iliac region measures 5.1 x 3.1 cm. Right inguinal adenopathy which may be assessable to biopsy measures 2.7 x 2 x 2.7 cm. Partially loculated right-sided pleural effusion. Pleural extension of tumor posterior inferior right thorax (series 3, images 30 through 36). Confluent atelectasis with air bronchograms right lower lobe right middle lobe. Less confluent atelectasis inferior right upper lobe. No central obstructing mass identified. Tethered pleura with sub pleural thickening greatest lung base. Multiple tiny bilateral nodules throughout all lung zones. Findings may reflect tumor such as small cell primary lung neoplasm in this smoker. Adenopathy from lymphoma is a possibility. Adenopathy and spread of tumor to lung from other primary tumor not excluded although other primary lesion not  identified (evaluation of bowel limited by under distension without obvious mass). Significantly enlarged liver spanning over 25.6 cm. Aortic Atherosclerosis (ICD10-I70.0). Prominent ascending thoracic aorta measuring up to 4 cm transverse dimension. Recommend annual imaging followup by CTA or MRA. This recommendation follows 2010 ACCF/AHA/AATS/ACR/ASA/SCA/SCAI/SIR/STS/SVM Guidelines for the Diagnosis and Management of Patients with Thoracic Aortic Disease. Circulation. 2010; 121: T625-W389 Exam not tailored to evaluate for pulmonary embolus. No central pulmonary embolus noted. Main pulmonary artery prominent measuring 3.6 cm which may reflect component of pulmonary hypertension. Electronically Signed   By: Genia Del M.D.   On: 08/08/2016 07:45   Dg Chest Port 1 View  Result Date: 08/08/2016 CLINICAL DATA:  Respiratory failure EXAM: PORTABLE CHEST 1 VIEW COMPARISON:  08/07/2016 FINDINGS: Right pleural effusion and right lower lobe airspace disease unchanged. Left lung remains clear. Heart size normal. Negative for heart failure. IMPRESSION: Right lower lobe airspace disease and right pleural effusion unchanged. Electronically Signed   By: Franchot Gallo M.D.   On: 08/08/2016 07:46   Ir Thoracentesis Asp Pleural Space W/img Guide  Result Date: 08/07/2016 INDICATION: Patient admitted with cough, dyspnea, orthopnea. Imaging reveals a a prevertebral soft tissue abnormality consistent with a mass as well as bilateral nodular densities in the upper lobes of the lungs. A moderate right pleural effusion is noted as well. Request has been made for diagnostic and therapeutic thoracentesis. EXAM: ULTRASOUND GUIDED DIAGNOSTIC AND THERAPEUTIC THORACENTESIS MEDICATIONS: 1% xylocaine COMPLICATIONS: None immediate. PROCEDURE: An ultrasound guided thoracentesis was thoroughly discussed with the patient and questions answered. The benefits, risks, alternatives and complications were also discussed. The patient understands  and wishes to proceed with the procedure. Written consent was obtained. Ultrasound was performed to localize and mark an adequate pocket of fluid in the right chest. The area was then prepped and draped in the normal sterile fashion. 1% Lidocaine was used for local anesthesia. Under ultrasound guidance a Safe-T-Centesis catheter was introduced. Thoracentesis was performed. The catheter was removed and a dressing applied. FINDINGS: A total of approximately 0.7 L of serosanguineous fluid was removed. Samples were sent to the laboratory as requested by the clinical team. IMPRESSION: Successful ultrasound guided right thoracentesis yielding 0.7 L of pleural fluid. Postprocedure chest radiograph shows no pneumothorax. Read by: Saverio Danker, PA-C Electronically Signed   By: Lucrezia Europe M.D.   On: 08/07/2016 13:05     CBC  Recent Labs Lab 08/06/16 2132 08/08/16 0509  WBC 10.2 9.1  HGB 11.7* 11.3*  HCT 36.5 35.4*  PLT 323 318  MCV 84.1 84.3  MCH 27.0 26.9  MCHC 32.1 31.9  RDW 13.0 13.6  LYMPHSABS 1.6 1.3  MONOABS 0.8 0.5  EOSABS 1.3* 1.0*  BASOSABS 0.0 0.0    Chemistries   Recent Labs Lab 08/06/16 2132 08/07/16 0442 08/08/16 0509  NA 139  --  139  K 4.0  --  4.2  CL 109  --  107  CO2 23  --  22  GLUCOSE 109*  --  105*  BUN 17  --  15  CREATININE 1.09*  --  0.92  CALCIUM 9.1  --  8.7*  AST  --  20  --   ALT  --  19  --   ALKPHOS  --  51  --   BILITOT  --  0.6  --    ------------------------------------------------------------------------------------------------------------------ estimated creatinine clearance is 78.1 mL/min (by C-G formula based on SCr of 0.92 mg/dL). ------------------------------------------------------------------------------------------------------------------ No results for input(s): HGBA1C in the last 72 hours. ------------------------------------------------------------------------------------------------------------------ No results for input(s): CHOL,  HDL, LDLCALC, TRIG, CHOLHDL, LDLDIRECT in the last 72 hours. ------------------------------------------------------------------------------------------------------------------ No results for input(s): TSH, T4TOTAL, T3FREE, THYROIDAB in the last 72 hours.  Invalid input(s): FREET3 ------------------------------------------------------------------------------------------------------------------ No results for input(s): VITAMINB12, FOLATE, FERRITIN, TIBC, IRON, RETICCTPCT in the last 72 hours.  Coagulation profile  Recent Labs Lab 08/07/16 0442  INR 1.09    No results for input(s): DDIMER in the last 72 hours.  Cardiac Enzymes No results for input(s): CKMB, TROPONINI, MYOGLOBIN in the last 168 hours.  Invalid input(s): CK ------------------------------------------------------------------------------------------------------------------ Invalid input(s): POCBNP   CBG: No results for input(s): GLUCAP in the last 168 hours.     Studies: Dg Chest 1 View  Result Date: 08/07/2016 CLINICAL  DATA:  Status post right thoracentesis. EXAM: CHEST 1 VIEW COMPARISON:  08/06/2016 chest radiograph. FINDINGS: Stable cardiomediastinal silhouette with normal heart size. No pneumothorax. Small right pleural effusion, decreased. No left pleural effusion. No pulmonary edema. Patchy right lung base opacity is mildly decreased. No new consolidative airspace disease. IMPRESSION: 1. No pneumothorax. 2. Small right pleural effusion, decreased . 3. Nonspecific patchy right lung base opacity, decreased. Electronically Signed   By: Ilona Sorrel M.D.   On: 08/07/2016 13:23   Dg Chest 2 View  Result Date: 08/06/2016 CLINICAL DATA:  Acute onset of shortness of breath and lower back pain. Initial encounter. EXAM: CHEST  2 VIEW COMPARISON:  Chest radiograph performed 07/31/2016 FINDINGS: A small-to-moderate right-sided pleural effusion is noted, with right basilar airspace opacification, concerning for pneumonia. No  pneumothorax is seen. The left lung appears clear. The heart is borderline normal in size. No acute osseous abnormalities are identified. IMPRESSION: Small to moderate right-sided pleural effusion, with right basilar airspace opacification, concerning for pneumonia. Electronically Signed   By: Garald Balding M.D.   On: 08/06/2016 23:15   Ct Chest Wo Contrast  Result Date: 08/07/2016 CLINICAL DATA:  Dyspnea and lower back pain EXAM: CT CHEST WITHOUT CONTRAST TECHNIQUE: Multidetector CT imaging of the chest was performed following the standard protocol without IV contrast. COMPARISON:  CXR exams dating back through 07/31/2016 FINDINGS: Cardiovascular: Normal size cardiac chambers without pericardial effusion. Mitral annular calcifications are noted. Minimal coronary arteriosclerosis along the distal LAD. 4.2 cm ascending aortic aneurysm. Dilatation of the main pulmonary artery 3.5 cm consistent with a component of pulmonary hypertension. Mediastinum/Nodes: Prevertebral masslike abnormality along the descending thoracic aorta and esophagus possibly representing enlarged lymph node mass. This is estimated at 7.7 x 4.7 x 8 cm in transverse by AP by craniocaudad dimension. Further assessment is limited due to lack of oral and IV contrast. Lungs/Pleura: Loculated right moderate-sized pleural effusion with compressive atelectasis. Tiny subpleural 4 and 5 mm nodular densities are seen the left upper lobe with pleural-based 4 mm right upper lobe nodular density also noted. These are nonspecific. Upper Abdomen: Retrocrural adenopathy is seen on the left measuring 2.1 cm short axis. Musculoskeletal: Thoracolumbar spondylosis. No acute osseous abnormality. No lytic or blastic disease. IMPRESSION: Confluent prevertebral soft tissue abnormality along the descending thoracic aorta and esophagus. Findings are concerning for an enlarged lymph node mass given retrocrural adenopathy also seen. This confluent masslike abnormality  measures 7.7 x 4.7 x 8 cm. Moderate loculated right-sided pleural effusion with compressive atelectasis. Some of the atelectasis could potentially be post obstructive due to the adjacent mediastinal mass. Bilateral 5 mm or less nodular densities in the upper lobes. No follow-up needed if patient is low-risk (and has no known or suspected primary neoplasm). Non-contrast chest CT can be considered in 12 months if patient is high-risk. This recommendation follows the consensus statement: Guidelines for Management of Incidental Pulmonary Nodules Detected on CT Images: From the Fleischner Society 2017; Radiology 2017; 284:228-243. Electronically Signed   By: Ashley Royalty M.D.   On: 08/07/2016 00:31   Ct Chest W Contrast  Result Date: 08/08/2016 CLINICAL DATA:  66 year old female with mediastinal adenopathy and shortness breath. Post thoracentesis. Cytology pending. Recently quit smoking. Evaluate for metastatic disease. Post abdominal hysterectomy. Subsequent encounter. EXAM: CT CHEST, ABDOMEN, AND PELVIS WITH CONTRAST TECHNIQUE: Multidetector CT imaging of the chest, abdomen and pelvis was performed following the standard protocol during bolus administration of intravenous contrast. CONTRAST:  125mL ISOVUE-300 IOPAMIDOL (ISOVUE-300) INJECTION 61% COMPARISON:  08/07/2016 chest x-ray and chest CT. 05/24/2005 CT of the abdomen and pelvis. FINDINGS: CT CHEST FINDINGS Cardiovascular: Heart size within normal limits. Prominent mitral valve calcifications. Trace coronary artery calcifications. Prominent ascending thoracic aorta measuring up to 4 cm transverse dimension. Exam not tailored to evaluate for pulmonary embolus. No central pulmonary embolus noted. Main pulmonary artery prominent measuring 3.6 cm which may reflect component of pulmonary hypertension. 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 Question epidural extension of tumor versus engorged epidural veins. Contrast-enhanced MR can be obtained for further delineation of clinically desired. Lungs/Pleura: Partially loculated right-sided pleural effusion. Pleural extension of tumor posterior inferior right thorax (series 3, images 30 through 36). Confluent atelectasis with air bronchograms right lower lobe right middle lobe. Less confluent atelectasis inferior right upper lobe. No central obstructing mass identified. Tethered pleura with sub pleural thickening greatest lung base. Multiple tiny bilateral nodules throughout all lung zones. Accessory fissure left upper lobe incidentally noted. Trachea and mainstem bronchi are patent. Musculoskeletal: Although thoracic vertebra surrounded by mass, no obvious destruction. Degenerative changes throughout the lower cervical and thoracic spine. CT ABDOMEN PELVIS FINDINGS Hepatobiliary: Significantly enlarged liver spanning over 25.6 cm without focal hepatic lesion noted. No calcified gallstones. Pancreas: No primary pancreatic mass or inflammation. Spleen: No splenic mass or enlargement. Adrenals/Urinary Tract: No hydronephrosis. Left lower pole 1 cm low-density structure statistically likely a cyst. No adrenal mass. Noncontrast filled views of the urinary bladder with limited evaluation secondary to habitus/ artifact. It would be difficult on the present examination to exclude a primary bladder abnormality. Stomach/Bowel: Diverticulosis with regions of muscular hypertrophy. No extraluminal bowel inflammatory process. Limited for evaluating for possibility of bowel/ stomach primary mass secondary to under distension. No obvious abnormality noted. Vascular/Lymphatic: Retroperitoneal, upper abdominal, mesenteric, pelvic and right inguinal adenopathy. Index lymph node right external iliac region measures 5.1 x 3.1 cm. Right inguinal  adenopathy which may be assessable to biopsy measures 2.7 x 2 x 2.7 cm. Trace abdominal aortic calcification without aneurysm. No large arterial vessel occlusion. Reproductive: Post hysterectomy.  No obvious adnexal mass. Other: No free intraperitoneal air. Injection granulomas and subcutaneous gas from injection. Musculoskeletal: No destructive lesion identified. Degenerative changes most prominent L4-5 level with facet degenerative changes, mild anterior slip L4 and bulge contribute to multifactorial spinal stenosis. 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 Question epidural extension of tumor versus engorged epidural veins. Contrast-enhanced MR can be obtained for further delineation of clinically desired. Abdominal region retroperitoneal upper abdominal, mesenteric, pelvic and right inguinal adenopathy. Index lymph node right external iliac region measures 5.1 x 3.1 cm. Right inguinal adenopathy which may be assessable to biopsy measures 2.7 x 2 x 2.7 cm. Partially loculated right-sided pleural effusion. Pleural extension of tumor posterior inferior right thorax (series 3, images 30 through 36). Confluent atelectasis with air bronchograms right lower lobe right middle lobe. Less confluent atelectasis inferior right upper lobe. No central obstructing mass identified. Tethered pleura with sub pleural thickening greatest lung base. Multiple tiny bilateral nodules throughout all lung zones. Findings may reflect tumor such as small cell primary lung neoplasm in this smoker. Adenopathy from lymphoma is a possibility. Adenopathy and spread of tumor to lung from other primary tumor  not excluded although other primary lesion not identified (evaluation of bowel limited by under distension without obvious mass). Significantly  enlarged liver spanning over 25.6 cm. Aortic Atherosclerosis (ICD10-I70.0). Prominent ascending thoracic aorta measuring up to 4 cm transverse dimension. Recommend annual imaging followup by CTA or MRA. This recommendation follows 2010 ACCF/AHA/AATS/ACR/ASA/SCA/SCAI/SIR/STS/SVM Guidelines for the Diagnosis and Management of Patients with Thoracic Aortic Disease. Circulation. 2010; 121: D622-W979 Exam not tailored to evaluate for pulmonary embolus. No central pulmonary embolus noted. Main pulmonary artery prominent measuring 3.6 cm which may reflect component of pulmonary hypertension. Electronically Signed   By: Genia Del M.D.   On: 08/08/2016 07:45   Ct Abdomen Pelvis W Contrast  Result Date: 08/08/2016 CLINICAL DATA:  66 year old female with mediastinal adenopathy and shortness breath. Post thoracentesis. Cytology pending. Recently quit smoking. Evaluate for metastatic disease. Post abdominal hysterectomy. Subsequent encounter. EXAM: CT CHEST, ABDOMEN, AND PELVIS WITH CONTRAST TECHNIQUE: Multidetector CT imaging of the chest, abdomen and pelvis was performed following the standard protocol during bolus administration of intravenous contrast. CONTRAST:  166mL ISOVUE-300 IOPAMIDOL (ISOVUE-300) INJECTION 61% COMPARISON:  08/07/2016 chest x-ray and chest CT. 05/24/2005 CT of the abdomen and pelvis. FINDINGS: CT CHEST FINDINGS Cardiovascular: Heart size within normal limits. Prominent mitral valve calcifications. Trace coronary artery calcifications. Prominent ascending thoracic aorta measuring up to 4 cm transverse dimension. Exam not tailored to evaluate for pulmonary embolus. No central pulmonary embolus noted. Main pulmonary artery prominent measuring 3.6 cm which may reflect component of pulmonary hypertension. Mediastinum/Nodes: Paravertebral mass envelops vertebra and descending thoracic aorta (displacing and compressing the esophagus anteriorly) suspicious for confluent adenopathy with maximal transverse  dimension of 8.8 x 8.6 cm spanning from T4 -T12. Question epidural extension of tumor versus engorged epidural veins. Contrast-enhanced MR can be obtained for further delineation of clinically desired. Lungs/Pleura: Partially loculated right-sided pleural effusion. Pleural extension of tumor posterior inferior right thorax (series 3, images 30 through 36). Confluent atelectasis with air bronchograms right lower lobe right middle lobe. Less confluent atelectasis inferior right upper lobe. No central obstructing mass identified. Tethered pleura with sub pleural thickening greatest lung base. Multiple tiny bilateral nodules throughout all lung zones. Accessory fissure left upper lobe incidentally noted. Trachea and mainstem bronchi are patent. Musculoskeletal: Although thoracic vertebra surrounded by mass, no obvious destruction. Degenerative changes throughout the lower cervical and thoracic spine. CT ABDOMEN PELVIS FINDINGS Hepatobiliary: Significantly enlarged liver spanning over 25.6 cm without focal hepatic lesion noted. No calcified gallstones. Pancreas: No primary pancreatic mass or inflammation. Spleen: No splenic mass or enlargement. Adrenals/Urinary Tract: No hydronephrosis. Left lower pole 1 cm low-density structure statistically likely a cyst. No adrenal mass. Noncontrast filled views of the urinary bladder with limited evaluation secondary to habitus/ artifact. It would be difficult on the present examination to exclude a primary bladder abnormality. Stomach/Bowel: Diverticulosis with regions of muscular hypertrophy. No extraluminal bowel inflammatory process. Limited for evaluating for possibility of bowel/ stomach primary mass secondary to under distension. No obvious abnormality noted. Vascular/Lymphatic: Retroperitoneal, upper abdominal, mesenteric, pelvic and right inguinal adenopathy. Index lymph node right external iliac region measures 5.1 x 3.1 cm. Right inguinal adenopathy which may be assessable  to biopsy measures 2.7 x 2 x 2.7 cm. Trace abdominal aortic calcification without aneurysm. No large arterial vessel occlusion. Reproductive: Post hysterectomy.  No obvious adnexal mass. Other: No free intraperitoneal air. Injection granulomas and subcutaneous gas from injection. Musculoskeletal: No destructive lesion identified. Degenerative changes most prominent L4-5 level with facet degenerative changes, mild anterior slip L4  and bulge contribute to multifactorial spinal stenosis. 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 Question epidural extension of tumor versus engorged epidural veins. Contrast-enhanced MR can be obtained for further delineation of clinically desired. Abdominal region retroperitoneal upper abdominal, mesenteric, pelvic and right inguinal adenopathy. Index lymph node right external iliac region measures 5.1 x 3.1 cm. Right inguinal adenopathy which may be assessable to biopsy measures 2.7 x 2 x 2.7 cm. Partially loculated right-sided pleural effusion. Pleural extension of tumor posterior inferior right thorax (series 3, images 30 through 36). Confluent atelectasis with air bronchograms right lower lobe right middle lobe. Less confluent atelectasis inferior right upper lobe. No central obstructing mass identified. Tethered pleura with sub pleural thickening greatest lung base. Multiple tiny bilateral nodules throughout all lung zones. Findings may reflect tumor such as small cell primary lung neoplasm in this smoker. Adenopathy from lymphoma is a possibility. Adenopathy and spread of tumor to lung from other primary tumor not excluded although other primary lesion not identified (evaluation of bowel limited by under distension without obvious mass). Significantly enlarged liver spanning over 25.6  cm. Aortic Atherosclerosis (ICD10-I70.0). Prominent ascending thoracic aorta measuring up to 4 cm transverse dimension. Recommend annual imaging followup by CTA or MRA. This recommendation follows 2010 ACCF/AHA/AATS/ACR/ASA/SCA/SCAI/SIR/STS/SVM Guidelines for the Diagnosis and Management of Patients with Thoracic Aortic Disease. Circulation. 2010; 121: X505-W979 Exam not tailored to evaluate for pulmonary embolus. No central pulmonary embolus noted. Main pulmonary artery prominent measuring 3.6 cm which may reflect component of pulmonary hypertension. Electronically Signed   By: Genia Del M.D.   On: 08/08/2016 07:45   Dg Chest Port 1 View  Result Date: 08/08/2016 CLINICAL DATA:  Respiratory failure EXAM: PORTABLE CHEST 1 VIEW COMPARISON:  08/07/2016 FINDINGS: Right pleural effusion and right lower lobe airspace disease unchanged. Left lung remains clear. Heart size normal. Negative for heart failure. IMPRESSION: Right lower lobe airspace disease and right pleural effusion unchanged. Electronically Signed   By: Franchot Gallo M.D.   On: 08/08/2016 07:46   Ir Thoracentesis Asp Pleural Space W/img Guide  Result Date: 08/07/2016 INDICATION: Patient admitted with cough, dyspnea, orthopnea. Imaging reveals a a prevertebral soft tissue abnormality consistent with a mass as well as bilateral nodular densities in the upper lobes of the lungs. A moderate right pleural effusion is noted as well. Request has been made for diagnostic and therapeutic thoracentesis. EXAM: ULTRASOUND GUIDED DIAGNOSTIC AND THERAPEUTIC THORACENTESIS MEDICATIONS: 1% xylocaine COMPLICATIONS: None immediate. PROCEDURE: An ultrasound guided thoracentesis was thoroughly discussed with the patient and questions answered. The benefits, risks, alternatives and complications were also discussed. The patient understands and wishes to proceed with the procedure. Written consent was obtained. Ultrasound was performed to localize and mark an adequate  pocket of fluid in the right chest. The area was then prepped and draped in the normal sterile fashion. 1% Lidocaine was used for local anesthesia. Under ultrasound guidance a Safe-T-Centesis catheter was introduced. Thoracentesis was performed. The catheter was removed and a dressing applied. FINDINGS: A total of approximately 0.7 L of serosanguineous fluid was removed. Samples were sent to the laboratory as requested by the clinical team. IMPRESSION: Successful ultrasound guided right thoracentesis yielding 0.7 L of pleural fluid. Postprocedure chest radiograph shows no pneumothorax. Read by: Saverio Danker, PA-C Electronically Signed   By: Lucrezia Europe M.D.   On: 08/07/2016 13:05  No results found for: HGBA1C Lab Results  Component Value Date   CREATININE 0.92 08/08/2016       Scheduled Meds: . heparin  5,000 Units Subcutaneous Q8H  . mouth rinse  15 mL Mouth Rinse BID  . pantoprazole  40 mg Oral Daily   Continuous Infusions: . sodium chloride 75 mL/hr at 08/08/16 0717     LOS: 1 day    Time spent: >30 MINS    Reyne Dumas  [facility] Pager [phone]. If 7PM-7AM, please contact night-coverage at [email or web address], password [email or web address] 08/08/2016, 8:07 AM  LOS: 1 day

## 2016-08-08 NOTE — Progress Notes (Signed)
SLP Cancellation Note  Patient Details Name: Jo Collins MRN: [id number] DOB: May 19, 1950   Cancelled treatment:       Reason Eval/Treat Not Completed: Medical issues which prohibited therapy. Pt currently NPO for procedure. Will reattempt as time allows/ when appropriate for bedside swallow evaluation.   Kern Reap, [place], CCC-SLP 08/08/2016, 8:50 AM  [phone]

## 2016-08-08 NOTE — Progress Notes (Signed)
Name: Jo Collins MRN: [id number] DOB: 1950-10-31    ADMISSION DATE:  08/06/2016 CONSULTATION DATE:  08/07/16  REFERRING MD :  Allyson Sabal  CHIEF COMPLAINT:  SOB   BRIEF SUMMARY:  Jo Collins is a 66 y.o. female who presented to [facility] ED 8/6 with SOB, progressive non-productive cough, orthopnea & back pain.  She had been seen a week prior and was discharged from ED with doxycycline and albuterol.  She took these but did not get much relief.  Had associated generalized fatigue along with chills and decreased appetite.  Denies fevers/sweats, chest pain, N/V/D, abd pain (although did have some mild nausea and abd pain after taking doxycycline).  She states that she never really "felt sick".  She had CXR which demonstrated a right pleural effusion.  CT of the chest demonstrated moderate loculated right sided effusion with compressive atelectasis, confluent prevertebral soft tissue abnormality along the descending thoracic aorta and esophagus concerning for an enlarged lymph node mass, bilateral 31mm nodular densities in upper lobes.  She was taken for right thoracentesis by IR 8/6 and PCCM was called in consultation for further recs and workup.  She has no hx of malignancy.  No prior hx effusions.  Denies weight loss, hemoptysis.  She is an ex smoker, quit roughly 1 week ago and has roughly 25 pack year history.   SUBJECTIVE:  Pt reports ongoing pain in her right lower side.  Denies significant SOB with exertion (although she appears dyspneic).  Anxious to hear about test results.   VITAL SIGNS: Temp:  [97.4 F (36.3 C)-98.1 F (36.7 C)] 98.1 F (36.7 C) (08/07 0402) Pulse Rate:  [62-97] 88 (08/07 0402) Resp:  [16-19] 19 (08/07 0402) BP: (120-188)/(50-95) 188/50 (08/07 0402) SpO2:  [92 %-97 %] 95 % (08/07 0402)  PHYSICAL EXAMINATION: General: obese female in NAD, sitting on side of bed HEENT: MM pink/moist, no jvd PSY: calm/appropriate Neuro: AAOx4, speech clear, MAE  CV: s1s2 rrr, no  m/r/g PULM: even/non-labored, lungs bilaterally diminished but clear  CH:ENID, non-tender, bsx4 active  Extremities: warm/dry, trace to 1+ pitting BLE edema  Skin: no rashes or lesions   Recent Labs Lab 08/06/16 2132 08/08/16 0509  NA 139 139  K 4.0 4.2  CL 109 107  CO2 23 22  BUN 17 15  CREATININE 1.09* 0.92  GLUCOSE 109* 105*    Recent Labs Lab 08/06/16 2132 08/08/16 0509  HGB 11.7* 11.3*  HCT 36.5 35.4*  WBC 10.2 9.1  PLT 323 318   Dg Chest 1 View  Result Date: 08/07/2016 CLINICAL DATA:  Status post right thoracentesis. EXAM: CHEST 1 VIEW COMPARISON:  08/06/2016 chest radiograph. FINDINGS: Stable cardiomediastinal silhouette with normal heart size. No pneumothorax. Small right pleural effusion, decreased. No left pleural effusion. No pulmonary edema. Patchy right lung base opacity is mildly decreased. No new consolidative airspace disease. IMPRESSION: 1. No pneumothorax. 2. Small right pleural effusion, decreased . 3. Nonspecific patchy right lung base opacity, decreased. Electronically Signed   By: Ilona Sorrel M.D.   On: 08/07/2016 13:23   Dg Chest 2 View  Result Date: 08/06/2016 CLINICAL DATA:  Acute onset of shortness of breath and lower back pain. Initial encounter. EXAM: CHEST  2 VIEW COMPARISON:  Chest radiograph performed 07/31/2016 FINDINGS: A small-to-moderate right-sided pleural effusion is noted, with right basilar airspace opacification, concerning for pneumonia. No pneumothorax is seen. The left lung appears clear. The heart is borderline normal in size. No acute osseous abnormalities are identified. IMPRESSION: Small to moderate right-sided  pleural effusion, with right basilar airspace opacification, concerning for pneumonia. Electronically Signed   By: Garald Balding M.D.   On: 08/06/2016 23:15   Ct Chest Wo Contrast  Result Date: 08/07/2016 CLINICAL DATA:  Dyspnea and lower back pain EXAM: CT CHEST WITHOUT CONTRAST TECHNIQUE: Multidetector CT imaging of the  chest was performed following the standard protocol without IV contrast. COMPARISON:  CXR exams dating back through 07/31/2016 FINDINGS: Cardiovascular: Normal size cardiac chambers without pericardial effusion. Mitral annular calcifications are noted. Minimal coronary arteriosclerosis along the distal LAD. 4.2 cm ascending aortic aneurysm. Dilatation of the main pulmonary artery 3.5 cm consistent with a component of pulmonary hypertension. Mediastinum/Nodes: Prevertebral masslike abnormality along the descending thoracic aorta and esophagus possibly representing enlarged lymph node mass. This is estimated at 7.7 x 4.7 x 8 cm in transverse by AP by craniocaudad dimension. Further assessment is limited due to lack of oral and IV contrast. Lungs/Pleura: Loculated right moderate-sized pleural effusion with compressive atelectasis. Tiny subpleural 4 and 5 mm nodular densities are seen the left upper lobe with pleural-based 4 mm right upper lobe nodular density also noted. These are nonspecific. Upper Abdomen: Retrocrural adenopathy is seen on the left measuring 2.1 cm short axis. Musculoskeletal: Thoracolumbar spondylosis. No acute osseous abnormality. No lytic or blastic disease. IMPRESSION: Confluent prevertebral soft tissue abnormality along the descending thoracic aorta and esophagus. Findings are concerning for an enlarged lymph node mass given retrocrural adenopathy also seen. This confluent masslike abnormality measures 7.7 x 4.7 x 8 cm. Moderate loculated right-sided pleural effusion with compressive atelectasis. Some of the atelectasis could potentially be post obstructive due to the adjacent mediastinal mass. Bilateral 5 mm or less nodular densities in the upper lobes. No follow-up needed if patient is low-risk (and has no known or suspected primary neoplasm). Non-contrast chest CT can be considered in 12 months if patient is high-risk. This recommendation follows the consensus statement: Guidelines for  Management of Incidental Pulmonary Nodules Detected on CT Images: From the Fleischner Society 2017; Radiology 2017; 284:228-243. Electronically Signed   By: Ashley Royalty M.D.   On: 08/07/2016 00:31   Ct Chest W Contrast  Result Date: 08/08/2016 CLINICAL DATA:  66 year old female with mediastinal adenopathy and shortness breath. Post thoracentesis. Cytology pending. Recently quit smoking. Evaluate for metastatic disease. Post abdominal hysterectomy. Subsequent encounter. EXAM: CT CHEST, ABDOMEN, AND PELVIS WITH CONTRAST TECHNIQUE: Multidetector CT imaging of the chest, abdomen and pelvis was performed following the standard protocol during bolus administration of intravenous contrast. CONTRAST:  135mL ISOVUE-300 IOPAMIDOL (ISOVUE-300) INJECTION 61% COMPARISON:  08/07/2016 chest x-ray and chest CT. 05/24/2005 CT of the abdomen and pelvis. FINDINGS: CT CHEST FINDINGS Cardiovascular: Heart size within normal limits. Prominent mitral valve calcifications. Trace coronary artery calcifications. Prominent ascending thoracic aorta measuring up to 4 cm transverse dimension. Exam not tailored to evaluate for pulmonary embolus. No central pulmonary embolus noted. Main pulmonary artery prominent measuring 3.6 cm which may reflect component of pulmonary hypertension. 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 Question epidural extension of tumor versus engorged epidural veins. Contrast-enhanced MR can be obtained for further delineation of clinically desired. Lungs/Pleura: Partially loculated right-sided pleural effusion. Pleural extension of tumor posterior inferior right thorax (series 3, images 30 through 36). Confluent atelectasis with air bronchograms right lower lobe right middle lobe. Less confluent atelectasis  inferior right upper lobe. No  central obstructing mass identified. Tethered pleura with sub pleural thickening greatest lung base. Multiple tiny bilateral nodules throughout all lung zones. Accessory fissure left upper lobe incidentally noted. Trachea and mainstem bronchi are patent. Musculoskeletal: Although thoracic vertebra surrounded by mass, no obvious destruction. Degenerative changes throughout the lower cervical and thoracic spine. CT ABDOMEN PELVIS FINDINGS Hepatobiliary: Significantly enlarged liver spanning over 25.6 cm without focal hepatic lesion noted. No calcified gallstones. Pancreas: No primary pancreatic mass or inflammation. Spleen: No splenic mass or enlargement. Adrenals/Urinary Tract: No hydronephrosis. Left lower pole 1 cm low-density structure statistically likely a cyst. No adrenal mass. Noncontrast filled views of the urinary bladder with limited evaluation secondary to habitus/ artifact. It would be difficult on the present examination to exclude a primary bladder abnormality. Stomach/Bowel: Diverticulosis with regions of muscular hypertrophy. No extraluminal bowel inflammatory process. Limited for evaluating for possibility of bowel/ stomach primary mass secondary to under distension. No obvious abnormality noted. Vascular/Lymphatic: Retroperitoneal, upper abdominal, mesenteric, pelvic and right inguinal adenopathy. Index lymph node right external iliac region measures 5.1 x 3.1 cm. Right inguinal adenopathy which may be assessable to biopsy measures 2.7 x 2 x 2.7 cm. Trace abdominal aortic calcification without aneurysm. No large arterial vessel occlusion. Reproductive: Post hysterectomy.  No obvious adnexal mass. Other: No free intraperitoneal air. Injection granulomas and subcutaneous gas from injection. Musculoskeletal: No destructive lesion identified. Degenerative changes most prominent L4-5 level with facet degenerative changes, mild anterior slip L4 and bulge contribute to  multifactorial spinal stenosis. 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 Question epidural extension of tumor versus engorged epidural veins. Contrast-enhanced MR can be obtained for further delineation of clinically desired. Abdominal region retroperitoneal upper abdominal, mesenteric, pelvic and right inguinal adenopathy. Index lymph node right external iliac region measures 5.1 x 3.1 cm. Right inguinal adenopathy which may be assessable to biopsy measures 2.7 x 2 x 2.7 cm. Partially loculated right-sided pleural effusion. Pleural extension of tumor posterior inferior right thorax (series 3, images 30 through 36). Confluent atelectasis with air bronchograms right lower lobe right middle lobe. Less confluent atelectasis inferior right upper lobe. No central obstructing mass identified. Tethered pleura with sub pleural thickening greatest lung base. Multiple tiny bilateral nodules throughout all lung zones. Findings may reflect tumor such as small cell primary lung neoplasm in this smoker. Adenopathy from lymphoma is a possibility. Adenopathy and spread of tumor to lung from other primary tumor not excluded although other primary lesion not identified (evaluation of bowel limited by under distension without obvious mass). Significantly enlarged liver spanning over 25.6 cm. Aortic Atherosclerosis (ICD10-I70.0). Prominent ascending thoracic aorta measuring up to 4 cm transverse dimension. Recommend annual imaging followup by CTA or MRA. This recommendation follows 2010 ACCF/AHA/AATS/ACR/ASA/SCA/SCAI/SIR/STS/SVM Guidelines for the Diagnosis and Management of Patients with Thoracic Aortic Disease. Circulation. 2010; 121: I297-L892 Exam not tailored to evaluate for pulmonary embolus. No central pulmonary embolus noted. Main  pulmonary artery prominent measuring 3.6 cm which may reflect component of pulmonary hypertension. Electronically Signed   By: Genia Del M.D.   On: 08/08/2016 07:45   Ct Abdomen Pelvis W Contrast  Result Date: 08/08/2016 CLINICAL DATA:  66 year old female with mediastinal adenopathy and shortness breath. Post thoracentesis. Cytology pending. Recently quit smoking. Evaluate for metastatic disease. Post abdominal hysterectomy. Subsequent encounter. EXAM: CT CHEST, ABDOMEN, AND PELVIS WITH CONTRAST TECHNIQUE: Multidetector CT imaging of  the chest, abdomen and pelvis was performed following the standard protocol during bolus administration of intravenous contrast. CONTRAST:  120mL ISOVUE-300 IOPAMIDOL (ISOVUE-300) INJECTION 61% COMPARISON:  08/07/2016 chest x-ray and chest CT. 05/24/2005 CT of the abdomen and pelvis. FINDINGS: CT CHEST FINDINGS Cardiovascular: Heart size within normal limits. Prominent mitral valve calcifications. Trace coronary artery calcifications. Prominent ascending thoracic aorta measuring up to 4 cm transverse dimension. Exam not tailored to evaluate for pulmonary embolus. No central pulmonary embolus noted. Main pulmonary artery prominent measuring 3.6 cm which may reflect component of pulmonary hypertension. 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 Question epidural extension of tumor versus engorged epidural veins. Contrast-enhanced MR can be obtained for further delineation of clinically desired. Lungs/Pleura: Partially loculated right-sided pleural effusion. Pleural extension of tumor posterior inferior right thorax (series 3, images 30 through 36). Confluent atelectasis with air bronchograms right lower lobe right middle lobe. Less confluent atelectasis inferior right upper lobe.  No central obstructing mass identified. Tethered pleura with sub pleural thickening greatest lung base. Multiple tiny bilateral nodules throughout all lung zones. Accessory fissure left upper lobe incidentally noted. Trachea and mainstem bronchi are patent. Musculoskeletal: Although thoracic vertebra surrounded by mass, no obvious destruction. Degenerative changes throughout the lower cervical and thoracic spine. CT ABDOMEN PELVIS FINDINGS Hepatobiliary: Significantly enlarged liver spanning over 25.6 cm without focal hepatic lesion noted. No calcified gallstones. Pancreas: No primary pancreatic mass or inflammation. Spleen: No splenic mass or enlargement. Adrenals/Urinary Tract: No hydronephrosis. Left lower pole 1 cm low-density structure statistically likely a cyst. No adrenal mass. Noncontrast filled views of the urinary bladder with limited evaluation secondary to habitus/ artifact. It would be difficult on the present examination to exclude a primary bladder abnormality. Stomach/Bowel: Diverticulosis with regions of muscular hypertrophy. No extraluminal bowel inflammatory process. Limited for evaluating for possibility of bowel/ stomach primary mass secondary to under distension. No obvious abnormality noted. Vascular/Lymphatic: Retroperitoneal, upper abdominal, mesenteric, pelvic and right inguinal adenopathy. Index lymph node right external iliac region measures 5.1 x 3.1 cm. Right inguinal adenopathy which may be assessable to biopsy measures 2.7 x 2 x 2.7 cm. Trace abdominal aortic calcification without aneurysm. No large arterial vessel occlusion. Reproductive: Post hysterectomy.  No obvious adnexal mass. Other: No free intraperitoneal air. Injection granulomas and subcutaneous gas from injection. Musculoskeletal: No destructive lesion identified. Degenerative changes most prominent L4-5 level with facet degenerative changes, mild anterior slip L4 and bulge contribute to multifactorial spinal stenosis.  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 Question epidural extension of tumor versus engorged epidural veins. Contrast-enhanced MR can be obtained for further delineation of clinically desired. Abdominal region retroperitoneal upper abdominal, mesenteric, pelvic and right inguinal adenopathy. Index lymph node right external iliac region measures 5.1 x 3.1 cm. Right inguinal adenopathy which may be assessable to biopsy measures 2.7 x 2 x 2.7 cm. Partially loculated right-sided pleural effusion. Pleural extension of tumor posterior inferior right thorax (series 3, images 30 through 36). Confluent atelectasis with air bronchograms right lower lobe right middle lobe. Less confluent atelectasis inferior right upper lobe. No central obstructing mass identified. Tethered pleura with sub pleural thickening greatest lung base. Multiple tiny bilateral nodules throughout all lung zones. Findings may reflect tumor  such as small cell primary lung neoplasm in this smoker. Adenopathy from lymphoma is a possibility. Adenopathy and spread of tumor to lung from other primary tumor not excluded although other primary lesion not identified (evaluation of bowel limited by under distension without obvious mass). Significantly enlarged liver spanning over 25.6 cm. Aortic Atherosclerosis (ICD10-I70.0). Prominent ascending thoracic aorta measuring up to 4 cm transverse dimension. Recommend annual imaging followup by CTA or MRA. This recommendation follows 2010 ACCF/AHA/AATS/ACR/ASA/SCA/SCAI/SIR/STS/SVM Guidelines for the Diagnosis and Management of Patients with Thoracic Aortic Disease. Circulation. 2010; 121: P809-X833 Exam not tailored to evaluate for pulmonary embolus. No central pulmonary embolus noted. Main pulmonary artery prominent  measuring 3.6 cm which may reflect component of pulmonary hypertension. Electronically Signed   By: Genia Del M.D.   On: 08/08/2016 07:45   Dg Chest Port 1 View  Result Date: 08/08/2016 CLINICAL DATA:  Respiratory failure EXAM: PORTABLE CHEST 1 VIEW COMPARISON:  08/07/2016 FINDINGS: Right pleural effusion and right lower lobe airspace disease unchanged. Left lung remains clear. Heart size normal. Negative for heart failure. IMPRESSION: Right lower lobe airspace disease and right pleural effusion unchanged. Electronically Signed   By: Franchot Gallo M.D.   On: 08/08/2016 07:46   Ir Thoracentesis Asp Pleural Space W/img Guide  Result Date: 08/07/2016 INDICATION: Patient admitted with cough, dyspnea, orthopnea. Imaging reveals a a prevertebral soft tissue abnormality consistent with a mass as well as bilateral nodular densities in the upper lobes of the lungs. A moderate right pleural effusion is noted as well. Request has been made for diagnostic and therapeutic thoracentesis. EXAM: ULTRASOUND GUIDED DIAGNOSTIC AND THERAPEUTIC THORACENTESIS MEDICATIONS: 1% xylocaine COMPLICATIONS: None immediate. PROCEDURE: An ultrasound guided thoracentesis was thoroughly discussed with the patient and questions answered. The benefits, risks, alternatives and complications were also discussed. The patient understands and wishes to proceed with the procedure. Written consent was obtained. Ultrasound was performed to localize and mark an adequate pocket of fluid in the right chest. The area was then prepped and draped in the normal sterile fashion. 1% Lidocaine was used for local anesthesia. Under ultrasound guidance a Safe-T-Centesis catheter was introduced. Thoracentesis was performed. The catheter was removed and a dressing applied. FINDINGS: A total of approximately 0.7 L of serosanguineous fluid was removed. Samples were sent to the laboratory as requested by the clinical team. IMPRESSION: Successful ultrasound guided  right thoracentesis yielding 0.7 L of pleural fluid. Postprocedure chest radiograph shows no pneumothorax. Read by: Saverio Danker, PA-C Electronically Signed   By: Lucrezia Europe M.D.   On: 08/07/2016 13:05    STUDIES:  CXR 8/6 >> right pleural effusion. CT chest 8/6 >> moderate loculated right sided effusion with compressive atelectasis, confluent prevertebral soft tissue abnormality along the descending thoracic aorta and esophagus concerning for an enlarged lymph node mass, bilateral 31mm nodular densities in upper lobes. ECHO 8/7 >> moderate LVH, LVEF 60-65%, no RWMA, grade 1 diastolic dysfunction, trivial MR, trivial TR, pa peak pressure 54mmHg HIV 8/7 >> negative  R Inguinal Node Biopsy 8/8 >>   SIGNIFICANT EVENTS  8/06  Admit. Thora with 700cc of serosanguinous fluid removed. 8/08  R inguinal node biopsy   PLEURAL STUDIES 8/6  GS >> abundant WBC, both PMN and mononuclear, no organisims Amylase >> 31 Culture >>  AFB >> negative prelim >>  LDH >> 180 Protein >> 3.9 WBC >> 3,550 Cytology >> reactive mesothelial cells Flow Cytometry >>   CULTURES BCx2 8/6 >>   ASSESSMENT / PLAN:  Discussion:  66 y/o  F admitted with dyspnea, R pleural effusion and mediastinal lymphadenopathy in a current smoker. S/p Thora with 743ml fluid removed with relatively high eosinophil count, no neutrophils concerning for malignant process.  Pleural fluid cytology demonstrated reactive mesothelial cells. Right inguinal biopsy pending.    Right sided pleural effusion - concern for malignancy with large lymphadenopathy, upper lobe nodules and smoking hx.  She is now s/p thoracentesis by IR with 741ml fluid obtained. Pleural fluid exudative (protein ratio 0.6, LDH ratio 0.6) Compressive atelectasis. Mediastinal, Inguinal Lymphadenopathy Tobacco Abuse - reportedly quit one week prior to admit  Plan: Await pleural culture Await inguinal node biopsy findings > if negative may need EBUS vs paravertebral mass  biopsy  Pulmonary hygiene - IS, mobilize  Intermittent CXR  ECHO results as above Would keep inpatient until cytology returned as may need further procedures for diagnosis  PCCM will follow along.    Noe Gens, NP-C [facility] Pgr: [phone] or if no answer [phone] 08/08/2016, 10:25 AM

## 2016-08-08 NOTE — Progress Notes (Signed)
  Echocardiogram 2D Echocardiogram has been performed.  Oiva Dibari 08/08/2016, 4:58 PM

## 2016-08-08 NOTE — Consult Note (Signed)
Chief Complaint: Patient was seen in consultation today for right inguinal lymph node biopsy Chief Complaint  Patient presents with  . Shortness of Breath   at the request of Dr Derrek Gu  Supervising Physician: Sandi Mariscal  Patient Status: [facility] - In-pt  History of Present Illness: Jo Collins is a 66 y.o. 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 Question epidural extension of tumor versus engorged epidural veins. Contrast-enhanced MR can be obtained for further delineation of clinically desired. Abdominal region retroperitoneal upper abdominal, mesenteric, pelvic and right inguinal adenopathy. Index lymph node right external iliac region measures 5.1 x 3.1 cm. Right inguinal adenopathy which may be assessable to biopsy measures 2.7 x 2 x 2.7 cm. Partially loculated right-sided pleural effusion. Pleural extension of tumor posterior inferior right thorax (series 3, images 30 through 36). Confluent atelectasis with air bronchograms right lower lobe right middle lobe. Less confluent atelectasis inferior right upper lobe. No central obstructing mass identified. Tethered pleura with sub pleural thickening greatest lung base. Multiple tiny bilateral nodules throughout all lung zones. Findings may reflect tumor such as small cell primary lung neoplasm in this smoker. Adenopathy from lymphoma is a possibility. Adenopathy and spread of tumor to lung from other primary tumor not excluded although other primary lesion not identified (evaluation of bowel limited by under distension without obvious mass). Significantly enlarged liver  spanning over 25.6 cm. Aortic Atherosclerosis (ICD10-I70.0). Prominent ascending thoracic aorta measuring up to 4 cm transverse dimension. Recommend annual imaging followup by CTA or MRA. This recommendation follows 2010 ACCF/AHA/AATS/ACR/ASA/SCA/SCAI/SIR/STS/SVM Guidelines for the Diagnosis and Management of Patients with Thoracic Aortic Disease. Circulation. 2010; 121: Z610-R604 Exam not tailored to evaluate for pulmonary embolus. No central pulmonary embolus noted. Main pulmonary artery prominent measuring 3.6 cm which may reflect component of pulmonary hypertension.  Request for right inguinal LAN Bx per Dr Allyson Sabal Dr Pascal Lux has reviewed imaging and approves procedure Scheduled for probably today Past Medical History:  Diagnosis Date  . GERD (gastroesophageal reflux disease)     Past Surgical History:  Procedure Laterality Date  . ABDOMINAL HYSTERECTOMY    . IR THORACENTESIS ASP PLEURAL SPACE W/IMG GUIDE  08/07/2016    Allergies: Patient has no known allergies.  Medications: Prior to Admission medications   Medication Sig Start Date End Date Taking? Authorizing Provider  acetaminophen (TYLENOL) 500 MG tablet Take 1,000 mg by mouth every 6 (six) hours as needed for moderate pain.   Yes [provider]  albuterol (PROVENTIL HFA;VENTOLIN HFA) 108 (90 Base) MCG/ACT inhaler Inhale 2 puffs into the lungs every 6 (six) hours as needed for wheezing or shortness of breath.   Yes [provider]  magnesium oxide (MAG-OX) 400 MG tablet Take 400 mg by mouth daily.   Yes [provider]  Multiple Vitamin (MULTIVITAMIN WITH MINERALS) TABS tablet Take 1 tablet by mouth daily.   Yes [provider]     Family History  Problem Relation Age of Onset  . COPD Mother   . Diabetes Mellitus II Sister   . Diabetes Mellitus II Brother   . Diabetes Mellitus II Sister   . Stroke Sister   . Hypertension Sister   . Diabetes Mellitus II Sister  Social History     Social History  . Marital status: Married    Spouse name: N/A  . Number of children: N/A  . Years of education: N/A   Social History Main Topics  . Smoking status: Former Smoker    Packs/day: 0.50    Years: 47.00    Types: Cigarettes    Quit date: 07/28/2016  . Smokeless tobacco: Never Used  . Alcohol use No  . Drug use: Unknown  . Sexual activity: Not Asked   Other Topics Concern  . None   Social History Narrative  . None    Review of Systems: A 12 point ROS discussed and pertinent positives are indicated in the HPI above.  All other systems are negative.  Review of Systems  Constitutional: Positive for fatigue. Negative for activity change, appetite change and fever.  Respiratory: Positive for cough and shortness of breath.   Cardiovascular: Negative for chest pain.  Psychiatric/Behavioral: Negative for behavioral problems and confusion.    Vital Signs: BP (!) 188/50 (BP Location: Left Wrist)   Pulse 88   Temp 98.1 F (36.7 C) (Oral)   Resp 19   Ht 5' 1.5" (1.562 m)   Wt 285 lb (129.3 kg)   SpO2 95%   BMI 52.98 kg/m   Physical Exam  Constitutional: She is oriented to person, place, and time.  Cardiovascular: Normal rate and regular rhythm.   Pulmonary/Chest: Effort normal and breath sounds normal.  Abdominal: Soft. Bowel sounds are normal.  Neurological: She is alert and oriented to person, place, and time.  Skin: Skin is warm and dry.  Psychiatric: She has a normal mood and affect. Her behavior is normal. Judgment and thought content normal.  Nursing note and vitals reviewed.   Mallampati Score:  MD Evaluation Airway: WNL Heart: WNL Abdomen: WNL Chest/ Lungs: WNL ASA  Classification: 3 Mallampati/Airway Score: One  Imaging: Dg Chest 1 View  Result Date: 08/07/2016 CLINICAL DATA:  Status post right thoracentesis. EXAM: CHEST 1 VIEW COMPARISON:  08/06/2016 chest radiograph. FINDINGS: Stable cardiomediastinal silhouette with normal heart size. No  pneumothorax. Small right pleural effusion, decreased. No left pleural effusion. No pulmonary edema. Patchy right lung base opacity is mildly decreased. No new consolidative airspace disease. IMPRESSION: 1. No pneumothorax. 2. Small right pleural effusion, decreased . 3. Nonspecific patchy right lung base opacity, decreased. Electronically Signed   By: Ilona Sorrel M.D.   On: 08/07/2016 13:23   Dg Chest 2 View  Result Date: 08/06/2016 CLINICAL DATA:  Acute onset of shortness of breath and lower back pain. Initial encounter. EXAM: CHEST  2 VIEW COMPARISON:  Chest radiograph performed 07/31/2016 FINDINGS: A small-to-moderate right-sided pleural effusion is noted, with right basilar airspace opacification, concerning for pneumonia. No pneumothorax is seen. The left lung appears clear. The heart is borderline normal in size. No acute osseous abnormalities are identified. IMPRESSION: Small to moderate right-sided pleural effusion, with right basilar airspace opacification, concerning for pneumonia. Electronically Signed   By: Garald Balding M.D.   On: 08/06/2016 23:15   Dg Chest 2 View  Result Date: 07/31/2016 CLINICAL DATA:  Three days of nonproductive cough associated with shortness of breath and wheezing. Current smoker. EXAM: CHEST  2 VIEW COMPARISON:  Report of a chest x-ray of May 28, 2001. FINDINGS: The left lung is clear. On the right there is volume loss with obscuration of the hemidiaphragm. There is fluid in the minor fissure. There is no mediastinal shift. The heart and pulmonary vascularity are  normal. There is mild tortuosity of the ascending thoracic aorta. The bony thorax exhibits no acute abnormality. IMPRESSION: Atelectasis or pneumonia with right pleural E fusion. Followup PA and lateral chest X-ray is recommended in 3-4 weeks following trial of antibiotic therapy to ensure resolution and exclude underlying malignancy. Electronically Signed   By: David  Martinique M.D.   On: 07/31/2016 16:50   Ct  Chest Wo Contrast  Result Date: 08/07/2016 CLINICAL DATA:  Dyspnea and lower back pain EXAM: CT CHEST WITHOUT CONTRAST TECHNIQUE: Multidetector CT imaging of the chest was performed following the standard protocol without IV contrast. COMPARISON:  CXR exams dating back through 07/31/2016 FINDINGS: Cardiovascular: Normal size cardiac chambers without pericardial effusion. Mitral annular calcifications are noted. Minimal coronary arteriosclerosis along the distal LAD. 4.2 cm ascending aortic aneurysm. Dilatation of the main pulmonary artery 3.5 cm consistent with a component of pulmonary hypertension. Mediastinum/Nodes: Prevertebral masslike abnormality along the descending thoracic aorta and esophagus possibly representing enlarged lymph node mass. This is estimated at 7.7 x 4.7 x 8 cm in transverse by AP by craniocaudad dimension. Further assessment is limited due to lack of oral and IV contrast. Lungs/Pleura: Loculated right moderate-sized pleural effusion with compressive atelectasis. Tiny subpleural 4 and 5 mm nodular densities are seen the left upper lobe with pleural-based 4 mm right upper lobe nodular density also noted. These are nonspecific. Upper Abdomen: Retrocrural adenopathy is seen on the left measuring 2.1 cm short axis. Musculoskeletal: Thoracolumbar spondylosis. No acute osseous abnormality. No lytic or blastic disease. IMPRESSION: Confluent prevertebral soft tissue abnormality along the descending thoracic aorta and esophagus. Findings are concerning for an enlarged lymph node mass given retrocrural adenopathy also seen. This confluent masslike abnormality measures 7.7 x 4.7 x 8 cm. Moderate loculated right-sided pleural effusion with compressive atelectasis. Some of the atelectasis could potentially be post obstructive due to the adjacent mediastinal mass. Bilateral 5 mm or less nodular densities in the upper lobes. No follow-up needed if patient is low-risk (and has no known or suspected primary  neoplasm). Non-contrast chest CT can be considered in 12 months if patient is high-risk. This recommendation follows the consensus statement: Guidelines for Management of Incidental Pulmonary Nodules Detected on CT Images: From the Fleischner Society 2017; Radiology 2017; 284:228-243. Electronically Signed   By: Ashley Royalty M.D.   On: 08/07/2016 00:31   Ct Chest W Contrast  Result Date: 08/08/2016 CLINICAL DATA:  66 year old female with mediastinal adenopathy and shortness breath. Post thoracentesis. Cytology pending. Recently quit smoking. Evaluate for metastatic disease. Post abdominal hysterectomy. Subsequent encounter. EXAM: CT CHEST, ABDOMEN, AND PELVIS WITH CONTRAST TECHNIQUE: Multidetector CT imaging of the chest, abdomen and pelvis was performed following the standard protocol during bolus administration of intravenous contrast. CONTRAST:  140mL ISOVUE-300 IOPAMIDOL (ISOVUE-300) INJECTION 61% COMPARISON:  08/07/2016 chest x-ray and chest CT. 05/24/2005 CT of the abdomen and pelvis. FINDINGS: CT CHEST FINDINGS Cardiovascular: Heart size within normal limits. Prominent mitral valve calcifications. Trace coronary artery calcifications. Prominent ascending thoracic aorta measuring up to 4 cm transverse dimension. Exam not tailored to evaluate for pulmonary embolus. No central pulmonary embolus noted. Main pulmonary artery prominent measuring 3.6 cm which may reflect component of pulmonary hypertension. 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 Question epidural extension of tumor versus engorged epidural veins. Contrast-enhanced MR can be obtained for further delineation of clinically desired. Lungs/Pleura:  Partially loculated right-sided pleural effusion. Pleural extension of tumor posterior inferior  right thorax (series 3, images 30 through 36). Confluent atelectasis with air bronchograms right lower lobe right middle lobe. Less confluent atelectasis inferior right upper lobe. No central obstructing mass identified. Tethered pleura with sub pleural thickening greatest lung base. Multiple tiny bilateral nodules throughout all lung zones. Accessory fissure left upper lobe incidentally noted. Trachea and mainstem bronchi are patent. Musculoskeletal: Although thoracic vertebra surrounded by mass, no obvious destruction. Degenerative changes throughout the lower cervical and thoracic spine. CT ABDOMEN PELVIS FINDINGS Hepatobiliary: Significantly enlarged liver spanning over 25.6 cm without focal hepatic lesion noted. No calcified gallstones. Pancreas: No primary pancreatic mass or inflammation. Spleen: No splenic mass or enlargement. Adrenals/Urinary Tract: No hydronephrosis. Left lower pole 1 cm low-density structure statistically likely a cyst. No adrenal mass. Noncontrast filled views of the urinary bladder with limited evaluation secondary to habitus/ artifact. It would be difficult on the present examination to exclude a primary bladder abnormality. Stomach/Bowel: Diverticulosis with regions of muscular hypertrophy. No extraluminal bowel inflammatory process. Limited for evaluating for possibility of bowel/ stomach primary mass secondary to under distension. No obvious abnormality noted. Vascular/Lymphatic: Retroperitoneal, upper abdominal, mesenteric, pelvic and right inguinal adenopathy. Index lymph node right external iliac region measures 5.1 x 3.1 cm. Right inguinal adenopathy which may be assessable to biopsy measures 2.7 x 2 x 2.7 cm. Trace abdominal aortic calcification without aneurysm. No large arterial vessel occlusion. Reproductive: Post hysterectomy.  No obvious adnexal mass. Other: No free intraperitoneal air. Injection granulomas and subcutaneous gas from injection. Musculoskeletal: No  destructive lesion identified. Degenerative changes most prominent L4-5 level with facet degenerative changes, mild anterior slip L4 and bulge contribute to multifactorial spinal stenosis. 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 Question epidural extension of tumor versus engorged epidural veins. Contrast-enhanced MR can be obtained for further delineation of clinically desired. Abdominal region retroperitoneal upper abdominal, mesenteric, pelvic and right inguinal adenopathy. Index lymph node right external iliac region measures 5.1 x 3.1 cm. Right inguinal adenopathy which may be assessable to biopsy measures 2.7 x 2 x 2.7 cm. Partially loculated right-sided pleural effusion. Pleural extension of tumor posterior inferior right thorax (series 3, images 30 through 36). Confluent atelectasis with air bronchograms right lower lobe right middle lobe. Less confluent atelectasis inferior right upper lobe. No central obstructing mass identified. Tethered pleura with sub pleural thickening greatest lung base. Multiple tiny bilateral nodules throughout all lung zones. Findings may reflect tumor such as small cell primary lung neoplasm in this smoker. Adenopathy from lymphoma is a possibility. Adenopathy and spread of tumor to lung from other primary tumor not excluded although other primary lesion not identified (evaluation of bowel limited by under distension without obvious mass). Significantly enlarged liver spanning over 25.6 cm. Aortic Atherosclerosis (ICD10-I70.0). Prominent ascending thoracic aorta measuring up to 4 cm transverse dimension. Recommend annual imaging followup by CTA or MRA. This recommendation follows 2010 ACCF/AHA/AATS/ACR/ASA/SCA/SCAI/SIR/STS/SVM Guidelines for the Diagnosis and Management of Patients  with Thoracic Aortic Disease. Circulation. 2010; 121: A193-X902 Exam not tailored to evaluate for pulmonary embolus. No central pulmonary embolus noted. Main pulmonary artery prominent measuring 3.6 cm which may reflect component of pulmonary hypertension. Electronically Signed   By: Genia Del M.D.   On: 08/08/2016 07:45   Ct Abdomen Pelvis W Contrast  Result Date: 08/08/2016 CLINICAL  DATA:  66 year old female with mediastinal adenopathy and shortness breath. Post thoracentesis. Cytology pending. Recently quit smoking. Evaluate for metastatic disease. Post abdominal hysterectomy. Subsequent encounter. EXAM: CT CHEST, ABDOMEN, AND PELVIS WITH CONTRAST TECHNIQUE: Multidetector CT imaging of the chest, abdomen and pelvis was performed following the standard protocol during bolus administration of intravenous contrast. CONTRAST:  173mL ISOVUE-300 IOPAMIDOL (ISOVUE-300) INJECTION 61% COMPARISON:  08/07/2016 chest x-ray and chest CT. 05/24/2005 CT of the abdomen and pelvis. FINDINGS: CT CHEST FINDINGS Cardiovascular: Heart size within normal limits. Prominent mitral valve calcifications. Trace coronary artery calcifications. Prominent ascending thoracic aorta measuring up to 4 cm transverse dimension. Exam not tailored to evaluate for pulmonary embolus. No central pulmonary embolus noted. Main pulmonary artery prominent measuring 3.6 cm which may reflect component of pulmonary hypertension. 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 Question epidural extension of tumor versus engorged epidural veins. Contrast-enhanced MR can be obtained for further delineation of clinically desired. Lungs/Pleura: Partially loculated right-sided pleural effusion. Pleural extension of tumor posterior inferior right thorax (series  3, images 30 through 36). Confluent atelectasis with air bronchograms right lower lobe right middle lobe. Less confluent atelectasis inferior right upper lobe. No central obstructing mass identified. Tethered pleura with sub pleural thickening greatest lung base. Multiple tiny bilateral nodules throughout all lung zones. Accessory fissure left upper lobe incidentally noted. Trachea and mainstem bronchi are patent. Musculoskeletal: Although thoracic vertebra surrounded by mass, no obvious destruction. Degenerative changes throughout the lower cervical and thoracic spine. CT ABDOMEN PELVIS FINDINGS Hepatobiliary: Significantly enlarged liver spanning over 25.6 cm without focal hepatic lesion noted. No calcified gallstones. Pancreas: No primary pancreatic mass or inflammation. Spleen: No splenic mass or enlargement. Adrenals/Urinary Tract: No hydronephrosis. Left lower pole 1 cm low-density structure statistically likely a cyst. No adrenal mass. Noncontrast filled views of the urinary bladder with limited evaluation secondary to habitus/ artifact. It would be difficult on the present examination to exclude a primary bladder abnormality. Stomach/Bowel: Diverticulosis with regions of muscular hypertrophy. No extraluminal bowel inflammatory process. Limited for evaluating for possibility of bowel/ stomach primary mass secondary to under distension. No obvious abnormality noted. Vascular/Lymphatic: Retroperitoneal, upper abdominal, mesenteric, pelvic and right inguinal adenopathy. Index lymph node right external iliac region measures 5.1 x 3.1 cm. Right inguinal adenopathy which may be assessable to biopsy measures 2.7 x 2 x 2.7 cm. Trace abdominal aortic calcification without aneurysm. No large arterial vessel occlusion. Reproductive: Post hysterectomy.  No obvious adnexal mass. Other: No free intraperitoneal air. Injection granulomas and subcutaneous gas from injection. Musculoskeletal: No destructive lesion identified.  Degenerative changes most prominent L4-5 level with facet degenerative changes, mild anterior slip L4 and bulge contribute to multifactorial spinal stenosis. 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 Question epidural extension of tumor versus engorged epidural veins. Contrast-enhanced MR can be obtained for further delineation of clinically desired. Abdominal region retroperitoneal upper abdominal, mesenteric, pelvic and right inguinal adenopathy. Index lymph node right external iliac region measures 5.1 x 3.1 cm. Right inguinal adenopathy which may be assessable to biopsy measures 2.7 x 2 x 2.7 cm. Partially loculated right-sided pleural effusion. Pleural extension of tumor posterior inferior right thorax (series 3, images 30 through 36). Confluent atelectasis with air bronchograms  right lower lobe right middle lobe. Less confluent atelectasis inferior right upper lobe. No central obstructing mass identified. Tethered pleura with sub pleural thickening greatest lung base. Multiple tiny bilateral nodules throughout all lung zones. Findings may reflect tumor such as small cell primary lung neoplasm in this smoker. Adenopathy from lymphoma is a possibility. Adenopathy and spread of tumor to lung from other primary tumor not excluded although other primary lesion not identified (evaluation of bowel limited by under distension without obvious mass). Significantly enlarged liver spanning over 25.6 cm. Aortic Atherosclerosis (ICD10-I70.0). Prominent ascending thoracic aorta measuring up to 4 cm transverse dimension. Recommend annual imaging followup by CTA or MRA. This recommendation follows 2010 ACCF/AHA/AATS/ACR/ASA/SCA/SCAI/SIR/STS/SVM Guidelines for the Diagnosis and Management of Patients with Thoracic Aortic Disease.  Circulation. 2010; 121: V564-P329 Exam not tailored to evaluate for pulmonary embolus. No central pulmonary embolus noted. Main pulmonary artery prominent measuring 3.6 cm which may reflect component of pulmonary hypertension. Electronically Signed   By: Genia Del M.D.   On: 08/08/2016 07:45   Dg Chest Port 1 View  Result Date: 08/08/2016 CLINICAL DATA:  Respiratory failure EXAM: PORTABLE CHEST 1 VIEW COMPARISON:  08/07/2016 FINDINGS: Right pleural effusion and right lower lobe airspace disease unchanged. Left lung remains clear. Heart size normal. Negative for heart failure. IMPRESSION: Right lower lobe airspace disease and right pleural effusion unchanged. Electronically Signed   By: Franchot Gallo M.D.   On: 08/08/2016 07:46   Ir Thoracentesis Asp Pleural Space W/img Guide  Result Date: 08/07/2016 INDICATION: Patient admitted with cough, dyspnea, orthopnea. Imaging reveals a a prevertebral soft tissue abnormality consistent with a mass as well as bilateral nodular densities in the upper lobes of the lungs. A moderate right pleural effusion is noted as well. Request has been made for diagnostic and therapeutic thoracentesis. EXAM: ULTRASOUND GUIDED DIAGNOSTIC AND THERAPEUTIC THORACENTESIS MEDICATIONS: 1% xylocaine COMPLICATIONS: None immediate. PROCEDURE: An ultrasound guided thoracentesis was thoroughly discussed with the patient and questions answered. The benefits, risks, alternatives and complications were also discussed. The patient understands and wishes to proceed with the procedure. Written consent was obtained. Ultrasound was performed to localize and mark an adequate pocket of fluid in the right chest. The area was then prepped and draped in the normal sterile fashion. 1% Lidocaine was used for local anesthesia. Under ultrasound guidance a Safe-T-Centesis catheter was introduced. Thoracentesis was performed. The catheter was removed and a dressing applied. FINDINGS: A total of approximately 0.7  L of serosanguineous fluid was removed. Samples were sent to the laboratory as requested by the clinical team. IMPRESSION: Successful ultrasound guided right thoracentesis yielding 0.7 L of pleural fluid. Postprocedure chest radiograph shows no pneumothorax. Read by: Saverio Danker, PA-C Electronically Signed   By: Lucrezia Europe M.D.   On: 08/07/2016 13:05    Labs:  CBC:  Recent Labs  07/31/16 1703 08/06/16 2132 08/08/16 0509  WBC  --  10.2 9.1  HGB 12.2 11.7* 11.3*  HCT 36.0 36.5 35.4*  PLT  --  323 318    COAGS:  Recent Labs  08/07/16 0442  INR 1.09    BMP:  Recent Labs  07/31/16 1703 08/06/16 2132 08/08/16 0509  NA 137 139 139  K 3.7 4.0 4.2  CL 111 109 107  CO2  --  23 22  GLUCOSE 96 109* 105*  BUN 8 17 15   CALCIUM  --  9.1 8.7*  CREATININE 0.90 1.09* 0.92  GFRNONAA  --  52* >60  GFRAA  --  >60 >  60    LIVER FUNCTION TESTS:  Recent Labs  08/07/16 0442 08/07/16 1245  BILITOT 0.6  --   AST 20  --   ALT 19  --   ALKPHOS 51  --   PROT 6.6 6.6  ALBUMIN 3.1*  --     TUMOR MARKERS: No results for input(s): AFPTM, CEA, CA199, CHROMGRNA in the last 8760 hours.  Assessment and Plan:  Admitted with SOB/cough; dyspnea Findings of mediastinal mass; R effusion; diffuse lymphadenoapthy Worrisome for lymphoma Thoracentesis fluid pending results Scheduled for Rt inguinal LN bx today Risks and benefits discussed with the patient including, but not limited to bleeding, infection, damage to adjacent structures or low yield requiring additional tests. All of the patient's questions were answered, patient is agreeable to proceed. Consent signed and in chart.  Thank you for this interesting consult.  I greatly enjoyed meeting Abigayl Hor and look forward to participating in their care.  A copy of this report was sent to the requesting provider on this date.  Electronically Signed: Lavonia Drafts, PA-C 08/08/2016, 9:05 AM   I spent a total of 40 Minutes    in  face to face in clinical consultation, greater than 50% of which was counseling/coordinating care for right inguinal LAN bx

## 2016-08-09 ENCOUNTER — Inpatient Hospital Stay (HOSPITAL_COMMUNITY): Payer: Medicare Other

## 2016-08-09 DIAGNOSIS — R222 Localized swelling, mass and lump, trunk: Secondary | ICD-10-CM

## 2016-08-09 DIAGNOSIS — J90 Pleural effusion, not elsewhere classified: Secondary | ICD-10-CM

## 2016-08-09 LAB — COMPREHENSIVE METABOLIC PANEL
ALBUMIN: 2.6 g/dL — AB (ref 3.5–5.0)
ALT: 15 U/L (ref 14–54)
ANION GAP: 7 (ref 5–15)
AST: 17 U/L (ref 15–41)
Alkaline Phosphatase: 43 U/L (ref 38–126)
BUN: 13 mg/dL (ref 6–20)
CHLORIDE: 108 mmol/L (ref 101–111)
CO2: 24 mmol/L (ref 22–32)
Calcium: 8.4 mg/dL — ABNORMAL LOW (ref 8.9–10.3)
Creatinine, Ser: 0.83 mg/dL (ref 0.44–1.00)
GFR calc Af Amer: >60 mL/min (ref >60)
GFR calc non Af Amer: >60 mL/min (ref >60)
GLUCOSE: 97 mg/dL (ref 65–99)
POTASSIUM: 3.8 mmol/L (ref 3.5–5.1)
SODIUM: 139 mmol/L (ref 135–145)
Total Bilirubin: 0.4 mg/dL (ref 0.3–1.2)
Total Protein: 5.9 g/dL — ABNORMAL LOW (ref 6.5–8.1)

## 2016-08-09 LAB — MAGNESIUM: Magnesium: 2 mg/dL (ref 1.7–2.4)

## 2016-08-09 LAB — PH, BODY FLUID: pH, Body Fluid: 7.9

## 2016-08-09 MED ORDER — FENTANYL CITRATE (PF) 100 MCG/2ML IJ SOLN
INTRAMUSCULAR | Status: AC
  Filled 2016-08-09: qty 2

## 2016-08-09 MED ORDER — LIDOCAINE HCL (PF) 1 % IJ SOLN
INTRAMUSCULAR | Status: AC
  Filled 2016-08-09: qty 30

## 2016-08-09 MED ORDER — FENTANYL CITRATE (PF) 100 MCG/2ML IJ SOLN
INTRAMUSCULAR | Status: AC | PRN
  Administered 2016-08-09: 50 ug via INTRAVENOUS
  Administered 2016-08-09: 25 ug via INTRAVENOUS

## 2016-08-09 MED ORDER — MIDAZOLAM HCL 2 MG/2ML IJ SOLN
INTRAMUSCULAR | Status: AC | PRN
  Administered 2016-08-09: 1 mg via INTRAVENOUS
  Administered 2016-08-09: 0.5 mg via INTRAVENOUS

## 2016-08-09 MED ORDER — MIDAZOLAM HCL 2 MG/2ML IJ SOLN
INTRAMUSCULAR | Status: AC
Start: 2016-08-09 — End: 2016-08-09
  Filled 2016-08-09: qty 2

## 2016-08-09 NOTE — Care Management Note (Signed)
Case Management Note  Patient Details  Name: Jo Collins MRN: [id number] Date of Birth: [birth year]  Subjective/Objective:                    Action/Plan:  From home with husband. For Bx today. Will continue to follow. Expected Discharge Date:  08/10/16               Expected Discharge Plan:  Home/Self Care  In-House Referral:     Discharge planning Services     Post Acute Care Choice:    Choice offered to:  Patient  DME Arranged:    DME Agency:     HH Arranged:    [place] Agency:     Status of Service:  In process, will continue to follow  If discussed at Long Length of Stay Meetings, dates discussed:    Additional Comments:  Marilu Favre, RN 08/09/2016, 10:31 AM

## 2016-08-09 NOTE — Progress Notes (Signed)
PROGRESS NOTE                                                                                                                                                                                                             Patient Demographics:    Jo Collins, is a 66 y.o. female, DOB - June 08, 1950, VVO:[id number]  Admit date - 08/06/2016   Admitting Physician Reubin Milan, MD  Outpatient Primary MD for the patient is Deland Pretty, MD  LOS - 2  Outpatient Specialists:NONE  Chief Complaint  Patient presents with  . Shortness of Breath       Brief Narrative   66 year old female with history of GERD presented to the ED with progressive nonproductive cough, shortness of breath and orthopnea. She also complains of pain in her right upper quadrant. She was seen in the ED 1 week back for similar symptoms and was discharged home with doxycycline and albuterol inhaler. CT scan done in the ED showed prevertebral soft tissue mass along the descending thoracic aorta and esophagus measuring 7.74.78 cm. Also showed diffuse abdominal and retroperitoneal lymphadenopathy. Patient underwent thoracentesis of right pleural effusion which was exudative.   Subjective:   Patient seen after returning from in one lymph node biopsy. Complains of pain in her right upper quadrant area.   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 Pleural fluid cytology pending. Paravertebral mass and developing the vertebrae and descending thoracic aorta. Right inguinal lymph node was biopsied by IR today. Follow pathology results. Patient is an active smoker and findings could represent primary lung neoplasm versus metastatic disease. Obtain MRI of the thoracic spine given questionable finding of epidural extension of tumor versus engorged  epidural veins seen on CT.  Right upper quadrant pain Possibly due to right pleural effusion and pleural extension of tumor posteriorly. Continue pain control.  Abnormal EKG with Q waves in anterior leads. 2-D echo shows LVH with normal LVEF.  GERD Continue PPI  Morbid obesity   Code Status : Full code  Family Communication  : Husband and sister at bedside  Disposition Plan  : Pending cytology results and further course  Barriers For Discharge : Active symptoms  Consults  :   PC CM IR  Procedures  :  Right thoracentesis CT of the chest and abdomen   DVT Prophylaxis  :  Lovenox -  Lab Results  Component Value Date   PLT 318 08/08/2016    Antibiotics  :    Anti-infectives    None        Objective:   Vitals:   08/09/16 1040 08/09/16 1051 08/09/16 1120 08/09/16 1402  BP: 120/79 129/67 120/74 124/68  Pulse: 72 75 70 74  Resp: 19 17 15 16   Temp:   98.3 F (36.8 C) 98.4 F (36.9 C)  TempSrc:   Oral Oral  SpO2: 98% 96% 98% 97%  Weight:      Height:        Wt Readings from Last 3 Encounters:  08/07/16 129.3 kg (285 lb)  07/31/16 129.3 kg (285 lb)    No intake or output data in the 24 hours ending 08/09/16 1742   Physical Exam  [id number] obese female not in distress HEENT: no pallor, moist mucosa, supple neck Chest: Diminished right-sided breath sounds CVS: N S1&S2, no murmurs,  GI: soft, nondistended, right upper quadrant tenderness, bowel sounds present, right inguinal lymph node biopsy site is clean Musculoskeletal: warm, no edema     Data Review:    CBC  Recent Labs Lab 08/06/16 2132 08/08/16 0509  WBC 10.2 9.1  HGB 11.7* 11.3*  HCT 36.5 35.4*  PLT 323 318  MCV 84.1 84.3  MCH 27.0 26.9  MCHC 32.1 31.9  RDW 13.0 13.6  LYMPHSABS 1.6 1.3  MONOABS 0.8 0.5  EOSABS 1.3* 1.0*  BASOSABS 0.0 0.0    Chemistries   Recent Labs Lab 08/06/16 2132 08/07/16 0442 08/08/16 0509 08/09/16 0535  NA 139  --  139 139  K 4.0  --  4.2  3.8  CL 109  --  107 108  CO2 23  --  22 24  GLUCOSE 109*  --  105* 97  BUN 17  --  15 13  CREATININE 1.09*  --  0.92 0.83  CALCIUM 9.1  --  8.7* 8.4*  MG  --   --   --  2.0  AST  --  20  --  17  ALT  --  19  --  15  ALKPHOS  --  51  --  43  BILITOT  --  0.6  --  0.4   ------------------------------------------------------------------------------------------------------------------ No results for input(s): CHOL, HDL, LDLCALC, TRIG, CHOLHDL, LDLDIRECT in the last 72 hours.  No results found for: HGBA1C ------------------------------------------------------------------------------------------------------------------ No results for input(s): TSH, T4TOTAL, T3FREE, THYROIDAB in the last 72 hours.  Invalid input(s): FREET3 ------------------------------------------------------------------------------------------------------------------ No results for input(s): VITAMINB12, FOLATE, FERRITIN, TIBC, IRON, RETICCTPCT in the last 72 hours.  Coagulation profile  Recent Labs Lab 08/07/16 0442  INR 1.09    No results for input(s): DDIMER in the last 72 hours.  Cardiac Enzymes No results for input(s): CKMB, TROPONINI, MYOGLOBIN in the last 168 hours.  Invalid input(s): CK ------------------------------------------------------------------------------------------------------------------    Component Value Date/Time   BNP 39.4 08/07/2016 0023    Inpatient Medications  Scheduled Meds: . fentaNYL      . [START ON 08/10/2016] heparin  5,000 Units Subcutaneous Q8H  . lidocaine (PF)      . mouth rinse  15 mL Mouth Rinse BID  . midazolam      . pantoprazole  40 mg Oral Daily   Continuous Infusions: PRN Meds:.ALPRAZolam, ipratropium-albuterol, oxyCODONE-acetaminophen  Micro Results Recent Results (from the past 240 hour(s))  Culture, blood (Routine  X 2) w Reflex to ID Panel     Status: None (Preliminary result)   Collection Time: 08/07/16 12:25 AM  Result Value Ref Range Status    Specimen Description BLOOD RIGHT ANTECUBITAL  Final   Special Requests   Final    BOTTLES DRAWN AEROBIC AND ANAEROBIC Blood Culture adequate volume   Culture NO GROWTH 2 DAYS  Final   Report Status PENDING  Incomplete  Culture, blood (Routine X 2) w Reflex to ID Panel     Status: None (Preliminary result)   Collection Time: 08/07/16 12:45 AM  Result Value Ref Range Status   Specimen Description BLOOD LEFT ANTECUBITAL  Final   Special Requests   Final    BOTTLES DRAWN AEROBIC ONLY Blood Culture adequate volume   Culture NO GROWTH 2 DAYS  Final   Report Status PENDING  Incomplete  Gram stain     Status: None   Collection Time: 08/07/16 12:36 PM  Result Value Ref Range Status   Specimen Description PLEURAL RIGHT  Final   Special Requests NONE  Final   Gram Stain   Final    ABUNDANT WBC PRESENT,BOTH PMN AND MONONUCLEAR NO ORGANISMS SEEN    Report Status 08/07/2016 FINAL  Final  Acid Fast Smear (AFB)     Status: None   Collection Time: 08/07/16  1:02 PM  Result Value Ref Range Status   AFB Specimen Processing Concentration  Final   Acid Fast Smear Negative  Final    Comment: (NOTE) Performed At: [facility] [address] [place], [place] [id number] Lindon Romp MD VP:[id number]    Source (AFB) PLEURAL  Final    Comment: RIGHT  Culture, body fluid-bottle     Status: None (Preliminary result)   Collection Time: 08/07/16  1:02 PM  Result Value Ref Range Status   Specimen Description PLEURAL RIGHT  Final   Special Requests BOTTLES DRAWN AEROBIC AND ANAEROBIC  Final   Culture NO GROWTH 2 DAYS  Final   Report Status PENDING  Incomplete    Radiology Reports Dg Chest 1 View  Result Date: 08/07/2016 CLINICAL DATA:  Status post right thoracentesis. EXAM: CHEST 1 VIEW COMPARISON:  08/06/2016 chest radiograph. FINDINGS: Stable cardiomediastinal silhouette with normal heart size. No pneumothorax. Small right pleural effusion, decreased. No left pleural effusion. No pulmonary  edema. Patchy right lung base opacity is mildly decreased. No new consolidative airspace disease. IMPRESSION: 1. No pneumothorax. 2. Small right pleural effusion, decreased . 3. Nonspecific patchy right lung base opacity, decreased. Electronically Signed   By: Ilona Sorrel M.D.   On: 08/07/2016 13:23   Dg Chest 2 View  Result Date: 08/06/2016 CLINICAL DATA:  Acute onset of shortness of breath and lower back pain. Initial encounter. EXAM: CHEST  2 VIEW COMPARISON:  Chest radiograph performed 07/31/2016 FINDINGS: A small-to-moderate right-sided pleural effusion is noted, with right basilar airspace opacification, concerning for pneumonia. No pneumothorax is seen. The left lung appears clear. The heart is borderline normal in size. No acute osseous abnormalities are identified. IMPRESSION: Small to moderate right-sided pleural effusion, with right basilar airspace opacification, concerning for pneumonia. Electronically Signed   By: Garald Balding M.D.   On: 08/06/2016 23:15   Dg Chest 2 View  Result Date: 07/31/2016 CLINICAL DATA:  Three days of nonproductive cough associated with shortness of breath and wheezing. Current smoker. EXAM: CHEST  2 VIEW COMPARISON:  Report of a chest x-ray of May 28, 2001. FINDINGS: The left lung is clear. On  the right there is volume loss with obscuration of the hemidiaphragm. There is fluid in the minor fissure. There is no mediastinal shift. The heart and pulmonary vascularity are normal. There is mild tortuosity of the ascending thoracic aorta. The bony thorax exhibits no acute abnormality. IMPRESSION: Atelectasis or pneumonia with right pleural E fusion. Followup PA and lateral chest X-ray is recommended in 3-4 weeks following trial of antibiotic therapy to ensure resolution and exclude underlying malignancy. Electronically Signed   By: David  Martinique M.D.   On: 07/31/2016 16:50   Ct Chest Wo Contrast  Result Date: 08/07/2016 CLINICAL DATA:  Dyspnea and lower back pain EXAM:  CT CHEST WITHOUT CONTRAST TECHNIQUE: Multidetector CT imaging of the chest was performed following the standard protocol without IV contrast. COMPARISON:  CXR exams dating back through 07/31/2016 FINDINGS: Cardiovascular: Normal size cardiac chambers without pericardial effusion. Mitral annular calcifications are noted. Minimal coronary arteriosclerosis along the distal LAD. 4.2 cm ascending aortic aneurysm. Dilatation of the main pulmonary artery 3.5 cm consistent with a component of pulmonary hypertension. Mediastinum/Nodes: Prevertebral masslike abnormality along the descending thoracic aorta and esophagus possibly representing enlarged lymph node mass. This is estimated at 7.7 x 4.7 x 8 cm in transverse by AP by craniocaudad dimension. Further assessment is limited due to lack of oral and IV contrast. Lungs/Pleura: Loculated right moderate-sized pleural effusion with compressive atelectasis. Tiny subpleural 4 and 5 mm nodular densities are seen the left upper lobe with pleural-based 4 mm right upper lobe nodular density also noted. These are nonspecific. Upper Abdomen: Retrocrural adenopathy is seen on the left measuring 2.1 cm short axis. Musculoskeletal: Thoracolumbar spondylosis. No acute osseous abnormality. No lytic or blastic disease. IMPRESSION: Confluent prevertebral soft tissue abnormality along the descending thoracic aorta and esophagus. Findings are concerning for an enlarged lymph node mass given retrocrural adenopathy also seen. This confluent masslike abnormality measures 7.7 x 4.7 x 8 cm. Moderate loculated right-sided pleural effusion with compressive atelectasis. Some of the atelectasis could potentially be post obstructive due to the adjacent mediastinal mass. Bilateral 5 mm or less nodular densities in the upper lobes. No follow-up needed if patient is low-risk (and has no known or suspected primary neoplasm). Non-contrast chest CT can be considered in 12 months if patient is high-risk. This  recommendation follows the consensus statement: Guidelines for Management of Incidental Pulmonary Nodules Detected on CT Images: From the Fleischner Society 2017; Radiology 2017; 284:228-243. Electronically Signed   By: Ashley Royalty M.D.   On: 08/07/2016 00:31   Ct Chest W Contrast  Result Date: 08/08/2016 CLINICAL DATA:  66 year old female with mediastinal adenopathy and shortness breath. Post thoracentesis. Cytology pending. Recently quit smoking. Evaluate for metastatic disease. Post abdominal hysterectomy. Subsequent encounter. EXAM: CT CHEST, ABDOMEN, AND PELVIS WITH CONTRAST TECHNIQUE: Multidetector CT imaging of the chest, abdomen and pelvis was performed following the standard protocol during bolus administration of intravenous contrast. CONTRAST:  151mL ISOVUE-300 IOPAMIDOL (ISOVUE-300) INJECTION 61% COMPARISON:  08/07/2016 chest x-ray and chest CT. 05/24/2005 CT of the abdomen and pelvis. FINDINGS: CT CHEST FINDINGS Cardiovascular: Heart size within normal limits. Prominent mitral valve calcifications. Trace coronary artery calcifications. Prominent ascending thoracic aorta measuring up to 4 cm transverse dimension. Exam not tailored to evaluate for pulmonary embolus. No central pulmonary embolus noted. Main pulmonary artery prominent measuring 3.6 cm which may reflect component of pulmonary hypertension. Mediastinum/Nodes: Paravertebral mass envelops vertebra and descending thoracic aorta (displacing and compressing the esophagus anteriorly) suspicious for confluent adenopathy with maximal transverse dimension of  8.8 x 8.6 cm spanning from T4 -T12. Question epidural extension of tumor versus engorged epidural veins. Contrast-enhanced MR can be obtained for further delineation of clinically desired. Lungs/Pleura: Partially loculated right-sided pleural effusion. Pleural extension of tumor posterior inferior right thorax (series 3, images 30 through 36). Confluent atelectasis with air bronchograms right  lower lobe right middle lobe. Less confluent atelectasis inferior right upper lobe. No central obstructing mass identified. Tethered pleura with sub pleural thickening greatest lung base. Multiple tiny bilateral nodules throughout all lung zones. Accessory fissure left upper lobe incidentally noted. Trachea and mainstem bronchi are patent. Musculoskeletal: Although thoracic vertebra surrounded by mass, no obvious destruction. Degenerative changes throughout the lower cervical and thoracic spine. CT ABDOMEN PELVIS FINDINGS Hepatobiliary: Significantly enlarged liver spanning over 25.6 cm without focal hepatic lesion noted. No calcified gallstones. Pancreas: No primary pancreatic mass or inflammation. Spleen: No splenic mass or enlargement. Adrenals/Urinary Tract: No hydronephrosis. Left lower pole 1 cm low-density structure statistically likely a cyst. No adrenal mass. Noncontrast filled views of the urinary bladder with limited evaluation secondary to habitus/ artifact. It would be difficult on the present examination to exclude a primary bladder abnormality. Stomach/Bowel: Diverticulosis with regions of muscular hypertrophy. No extraluminal bowel inflammatory process. Limited for evaluating for possibility of bowel/ stomach primary mass secondary to under distension. No obvious abnormality noted. Vascular/Lymphatic: Retroperitoneal, upper abdominal, mesenteric, pelvic and right inguinal adenopathy. Index lymph node right external iliac region measures 5.1 x 3.1 cm. Right inguinal adenopathy which may be assessable to biopsy measures 2.7 x 2 x 2.7 cm. Trace abdominal aortic calcification without aneurysm. No large arterial vessel occlusion. Reproductive: Post hysterectomy.  No obvious adnexal mass. Other: No free intraperitoneal air. Injection granulomas and subcutaneous gas from injection. Musculoskeletal: No destructive lesion identified. Degenerative changes most prominent L4-5 level with facet degenerative  changes, mild anterior slip L4 and bulge contribute to multifactorial spinal stenosis. 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 Question epidural extension of tumor versus engorged epidural veins. Contrast-enhanced MR can be obtained for further delineation of clinically desired. Abdominal region retroperitoneal upper abdominal, mesenteric, pelvic and right inguinal adenopathy. Index lymph node right external iliac region measures 5.1 x 3.1 cm. Right inguinal adenopathy which may be assessable to biopsy measures 2.7 x 2 x 2.7 cm. Partially loculated right-sided pleural effusion. Pleural extension of tumor posterior inferior right thorax (series 3, images 30 through 36). Confluent atelectasis with air bronchograms right lower lobe right middle lobe. Less confluent atelectasis inferior right upper lobe. No central obstructing mass identified. Tethered pleura with sub pleural thickening greatest lung base. Multiple tiny bilateral nodules throughout all lung zones. Findings may reflect tumor such as small cell primary lung neoplasm in this smoker. Adenopathy from lymphoma is a possibility. Adenopathy and spread of tumor to lung from other primary tumor not excluded although other primary lesion not identified (evaluation of bowel limited by under distension without obvious mass). Significantly enlarged liver spanning over 25.6 cm. Aortic Atherosclerosis (ICD10-I70.0). Prominent ascending thoracic aorta measuring up to 4 cm transverse dimension. Recommend annual imaging followup by CTA or MRA. This recommendation follows 2010 ACCF/AHA/AATS/ACR/ASA/SCA/SCAI/SIR/STS/SVM Guidelines for the Diagnosis and Management of Patients with Thoracic Aortic Disease. Circulation. 2010; 121: J242-A834 Exam not tailored to evaluate for  pulmonary embolus. No central pulmonary embolus noted. Main pulmonary artery prominent measuring 3.6 cm which may reflect component  of pulmonary hypertension. Electronically Signed   By: Genia Del M.D.   On: 08/08/2016 07:45   Ct Abdomen Pelvis W Contrast  Result Date: 08/08/2016 CLINICAL DATA:  66 year old female with mediastinal adenopathy and shortness breath. Post thoracentesis. Cytology pending. Recently quit smoking. Evaluate for metastatic disease. Post abdominal hysterectomy. Subsequent encounter. EXAM: CT CHEST, ABDOMEN, AND PELVIS WITH CONTRAST TECHNIQUE: Multidetector CT imaging of the chest, abdomen and pelvis was performed following the standard protocol during bolus administration of intravenous contrast. CONTRAST:  135mL ISOVUE-300 IOPAMIDOL (ISOVUE-300) INJECTION 61% COMPARISON:  08/07/2016 chest x-ray and chest CT. 05/24/2005 CT of the abdomen and pelvis. FINDINGS: CT CHEST FINDINGS Cardiovascular: Heart size within normal limits. Prominent mitral valve calcifications. Trace coronary artery calcifications. Prominent ascending thoracic aorta measuring up to 4 cm transverse dimension. Exam not tailored to evaluate for pulmonary embolus. No central pulmonary embolus noted. Main pulmonary artery prominent measuring 3.6 cm which may reflect component of pulmonary hypertension. 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 Question epidural extension of tumor versus engorged epidural veins. Contrast-enhanced MR can be obtained for further delineation of clinically desired. Lungs/Pleura: Partially loculated right-sided pleural effusion. Pleural extension of tumor posterior inferior right thorax (series 3, images 30 through 36). Confluent atelectasis with air bronchograms right lower lobe right middle  lobe. Less confluent atelectasis inferior right upper lobe. No central obstructing mass identified. Tethered pleura with sub pleural thickening greatest lung base. Multiple tiny bilateral nodules throughout all lung zones. Accessory fissure left upper lobe incidentally noted. Trachea and mainstem bronchi are patent. Musculoskeletal: Although thoracic vertebra surrounded by mass, no obvious destruction. Degenerative changes throughout the lower cervical and thoracic spine. CT ABDOMEN PELVIS FINDINGS Hepatobiliary: Significantly enlarged liver spanning over 25.6 cm without focal hepatic lesion noted. No calcified gallstones. Pancreas: No primary pancreatic mass or inflammation. Spleen: No splenic mass or enlargement. Adrenals/Urinary Tract: No hydronephrosis. Left lower pole 1 cm low-density structure statistically likely a cyst. No adrenal mass. Noncontrast filled views of the urinary bladder with limited evaluation secondary to habitus/ artifact. It would be difficult on the present examination to exclude a primary bladder abnormality. Stomach/Bowel: Diverticulosis with regions of muscular hypertrophy. No extraluminal bowel inflammatory process. Limited for evaluating for possibility of bowel/ stomach primary mass secondary to under distension. No obvious abnormality noted. Vascular/Lymphatic: Retroperitoneal, upper abdominal, mesenteric, pelvic and right inguinal adenopathy. Index lymph node right external iliac region measures 5.1 x 3.1 cm. Right inguinal adenopathy which may be assessable to biopsy measures 2.7 x 2 x 2.7 cm. Trace abdominal aortic calcification without aneurysm. No large arterial vessel occlusion. Reproductive: Post hysterectomy.  No obvious adnexal mass. Other: No free intraperitoneal air. Injection granulomas and subcutaneous gas from injection. Musculoskeletal: No destructive lesion identified. Degenerative changes most prominent L4-5 level with facet degenerative changes, mild anterior slip  L4 and bulge contribute to multifactorial spinal stenosis. 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 Question epidural extension of tumor versus engorged epidural veins. Contrast-enhanced MR can be obtained for further delineation of clinically desired. Abdominal region retroperitoneal upper abdominal, mesenteric, pelvic and right inguinal adenopathy. Index lymph node right external iliac region measures 5.1 x 3.1 cm. Right inguinal adenopathy which may be assessable to biopsy measures 2.7  x 2 x 2.7 cm. Partially loculated right-sided pleural effusion. Pleural extension of tumor posterior inferior right thorax (series 3, images 30 through 36). Confluent atelectasis with air bronchograms right lower lobe right middle lobe. Less confluent atelectasis inferior right upper lobe. No central obstructing mass identified. Tethered pleura with sub pleural thickening greatest lung base. Multiple tiny bilateral nodules throughout all lung zones. Findings may reflect tumor such as small cell primary lung neoplasm in this smoker. Adenopathy from lymphoma is a possibility. Adenopathy and spread of tumor to lung from other primary tumor not excluded although other primary lesion not identified (evaluation of bowel limited by under distension without obvious mass). Significantly enlarged liver spanning over 25.6 cm. Aortic Atherosclerosis (ICD10-I70.0). Prominent ascending thoracic aorta measuring up to 4 cm transverse dimension. Recommend annual imaging followup by CTA or MRA. This recommendation follows 2010 ACCF/AHA/AATS/ACR/ASA/SCA/SCAI/SIR/STS/SVM Guidelines for the Diagnosis and Management of Patients with Thoracic Aortic Disease. Circulation. 2010; 121: [id number] Exam not tailored to evaluate for pulmonary embolus. No central  pulmonary embolus noted. Main pulmonary artery prominent measuring 3.6 cm which may reflect component of pulmonary hypertension. Electronically Signed   By: Genia Del M.D.   On: 08/08/2016 07:45   US Biopsy  Result Date: 08/09/2016 INDICATION: 66 year old female with newly diagnosed multifocal lymphadenopathy highly concerning for malignant lymphoma. EXAM: Ultrasound-guided core biopsy right inguinal lymph node MEDICATIONS: None. ANESTHESIA/SEDATION: Moderate (conscious) sedation was employed during this procedure. A total of Versed 1.5 mg and Fentanyl 75 mcg was administered intravenously. Moderate Sedation Time: 13 minutes. The patient's level of consciousness and vital signs were monitored continuously by radiology nursing throughout the procedure under my direct supervision. FLUOROSCOPY TIME:  Fluoroscopy Time: 0 minutes 0 seconds (0 mGy). COMPLICATIONS: None immediate. PROCEDURE: Informed written consent was obtained from the patient after a thorough discussion of the procedural risks, benefits and alternatives. All questions were addressed. A timeout was performed prior to the initiation of the procedure. The right inguinal region was interrogated with ultrasound. A hypoechoic lymph node with internal vascularity measures 2.7 x 1.4 cm. A suitable skin entry site was selected and marked. Following sterile prep and drape in the standard fashion with chlorhexidine skin prep, local anesthesia was attained by infiltration with 1% lidocaine. A small dermatotomy was made. Using real-time sonographic guidance, multiple 16 gauge core biopsies were obtained using the Bard automated biopsy device. Biopsy specimens were placed in saline and delivered to pathology for further analysis. Post biopsy ultrasound imaging demonstrates no evidence of active bleeding or complication. IMPRESSION: Technically successful core biopsy of abnormal right inguinal lymph node. Signed, Criselda Peaches, MD [facility] Radiology Electronically Signed   By: Jacqulynn Cadet M.D.   On: 08/09/2016 13:12   Dg Chest Port 1 View  Result Date: 08/08/2016 CLINICAL DATA:  Respiratory failure EXAM: PORTABLE CHEST 1 VIEW COMPARISON:  08/07/2016 FINDINGS: Right pleural effusion and right lower lobe airspace disease unchanged. Left lung remains clear. Heart size normal. Negative for heart failure. IMPRESSION: Right lower lobe airspace disease and right pleural effusion unchanged. Electronically Signed   By: Franchot Gallo M.D.   On: 08/08/2016 07:46   Ir Thoracentesis Asp Pleural Space W/img Guide  Result Date: 08/07/2016 INDICATION: Patient admitted with cough, dyspnea, orthopnea. Imaging reveals a a prevertebral soft tissue abnormality consistent with a mass as well as bilateral nodular densities in the upper lobes of the lungs. A moderate right pleural effusion is noted as well. Request has been made for diagnostic  and therapeutic thoracentesis. EXAM: ULTRASOUND GUIDED DIAGNOSTIC AND THERAPEUTIC THORACENTESIS MEDICATIONS: 1% xylocaine COMPLICATIONS: None immediate. PROCEDURE: An ultrasound guided thoracentesis was thoroughly discussed with the patient and questions answered. The benefits, risks, alternatives and complications were also discussed. The patient understands and wishes to proceed with the procedure. Written consent was obtained. Ultrasound was performed to localize and mark an adequate pocket of fluid in the right chest. The area was then prepped and draped in the normal sterile fashion. 1% Lidocaine was used for local anesthesia. Under ultrasound guidance a Safe-T-Centesis catheter was introduced. Thoracentesis was performed. The catheter was removed and a dressing applied. FINDINGS: A total of approximately 0.7 L of serosanguineous fluid was removed. Samples were sent to the laboratory as requested by the clinical team. IMPRESSION: Successful ultrasound guided right  thoracentesis yielding 0.7 L of pleural fluid. Postprocedure chest radiograph shows no pneumothorax. Read by: Saverio Danker, PA-C Electronically Signed   By: Lucrezia Europe M.D.   On: 08/07/2016 13:05    Time Spent in minutes  25   Louellen Molder M.D on 08/09/2016 at 5:42 PM  Between 7am to 7pm - Pager - [phone]  After 7pm go to [email or web address] - password [facility]  [facility] -  Office  [phone]

## 2016-08-09 NOTE — Progress Notes (Signed)
SLP Contact Note  Patient Details Name: Jo Collins MRN: [id number] DOB: 1950/01/17   Cancelled Evaluation       Reason Eval/Treat Not Completed: Medical issues which prohibited therapy - Per RN, pt continues to be NPO for additional procedures. Paracentesis completed yesterday, and pt having biopsy done today. ST will continue efforts to evaluate swallow function and safety as pt available and appropriate.  Celia B. Ashkum, [facility], [place]  Shonna Chock 08/09/2016, 9:21 AM

## 2016-08-09 NOTE — Sedation Documentation (Signed)
Floor RN called, reports in and out of afib yesterday as well mD aware

## 2016-08-09 NOTE — Procedures (Signed)
Interventional Radiology Procedure Note  Procedure:  US guided biopsy   Complications: None  Estimated Blood Loss: None  Recommendations: - bedrest x 1 hr - Path is pending  Signed,  Criselda Peaches, MD

## 2016-08-09 NOTE — Sedation Documentation (Signed)
Converted to NSR, Dr Laurence Ferrari inforrmed

## 2016-08-09 NOTE — Evaluation (Signed)
Clinical/Bedside Swallow Evaluation Patient Details  Name: Jo Collins MRN: [id number] Date of Birth: Aug 14, 1950  Today's Date: 08/09/2016 Time: SLP Start Time (ACUTE ONLY): 1255 SLP Stop Time (ACUTE ONLY): 1304 SLP Time Calculation (min) (ACUTE ONLY): 9 min  Past Medical History:  Past Medical History:  Diagnosis Date  . GERD (gastroesophageal reflux disease)    Past Surgical History:  Past Surgical History:  Procedure Laterality Date  . ABDOMINAL HYSTERECTOMY    . IR THORACENTESIS ASP PLEURAL SPACE W/IMG GUIDE  08/07/2016   HPI:  66 y.o. female with medical history significant of GERD who returns to emergency department with complaints of progressively worse dry cough, dyspnea, orthopnea and now having back pain. She was seen about a week ago for these symptoms and was sent home with doxycycline 100 mg by mouth twice a day and albuterol inhaler. She mentions that this has not helped much. CT scan done in the ED showed  confluent prevertebral soft tissue abnormality along the descending thoracic aorta and esophagus 7.7 x 4.7 x 8 cm.  patient is status post thoracentesis of the right-sided pleural effusion by IR. Pccm also following for loculated pleural effusion, possible underlying malignancy   Assessment / Plan / Recommendation Clinical Impression  Pts swallow presents within functional limits. Note CT of chest revealed new finding of paravertebral mass which envelops vertebra and descending thoracic aorta(displacing and compressing the esophagus anteriorly). However pt denies acute swallow difficulty or changes within her swallow function. No overt s/sx of aspiration with any consistencies trialed. Continue regular thin liquid diet. No further ST needs identified.   SLP Visit Diagnosis: Dysphagia, unspecified (R13.10)    Aspiration Risk  No limitations    Diet Recommendation   Regular thin liquids   Medication Administration: Whole meds with liquid    Other  Recommendations      Follow up Recommendations None        Swallow Study   General Date of Onset: 08/06/16 HPI: 66 y.o. female with medical history significant of GERD who returns to emergency department with complaints of progressively worse dry cough, dyspnea, orthopnea and now having back pain. She was seen about a week ago for these symptoms and was sent home with doxycycline 100 mg by mouth twice a day and albuterol inhaler. She mentions that this has not helped much. CT scan done in the ED showed  confluent prevertebral soft tissue abnormality along the descending thoracic aorta and esophagus 7.7 x 4.7 x 8 cm.  patient is status post thoracentesis of the right-sided pleural effusion by IR. Pccm also following for loculated pleural effusion, possible underlying malignancy Type of Study: Bedside Swallow Evaluation Previous Swallow Assessment: none on file  Diet Prior to this Study: Regular;Thin liquids Temperature Spikes Noted: No Respiratory Status: Room air History of Recent Intubation: No Behavior/Cognition: Alert;Cooperative;Pleasant mood Oral Cavity Assessment: Within Functional Limits Oral Care Completed by SLP: No Oral Cavity - Dentition: Dentures, top;Dentures, bottom Vision: Functional for self-feeding Self-Feeding Abilities: Able to feed self Patient Positioning: Upright in bed Baseline Vocal Quality: Normal Volitional Cough: Strong Volitional Swallow: Able to elicit    Oral/Motor/Sensory Function Overall Oral Motor/Sensory Function: Within functional limits   Ice Chips Ice chips: Within functional limits   Thin Liquid Thin Liquid: Within functional limits    Nectar Thick Nectar Thick Liquid: Not tested   Honey Thick Honey Thick Liquid: Not tested   Puree Puree: Within functional limits   Solid   GO   Solid: Within functional limits  Arvil Chaco MA, CCC-SLP Acute Care Speech Language Pathologist    Levi Aland 08/09/2016,1:11 PM

## 2016-08-10 ENCOUNTER — Inpatient Hospital Stay (HOSPITAL_COMMUNITY): Payer: Medicare Other

## 2016-08-10 MED ORDER — OXYCODONE-ACETAMINOPHEN 5-325 MG PO TABS
2.0000 | ORAL_TABLET | ORAL | Status: DC | PRN
  Administered 2016-08-10 – 2016-08-11 (×2): 2 via ORAL
  Filled 2016-08-10 (×2): qty 2

## 2016-08-10 MED ORDER — KETOROLAC TROMETHAMINE 15 MG/ML IJ SOLN
15.0000 mg | Freq: Four times a day (QID) | INTRAMUSCULAR | Status: DC | PRN
  Administered 2016-08-10: 15 mg via INTRAVENOUS
  Filled 2016-08-10: qty 1

## 2016-08-10 MED ORDER — GADOBENATE DIMEGLUMINE 529 MG/ML IV SOLN
20.0000 mL | Freq: Once | INTRAVENOUS | Status: AC
  Administered 2016-08-10: 20 mL via INTRAVENOUS

## 2016-08-10 NOTE — Consult Note (Signed)
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  Referring Physician: Dr. Ananias Pilgrim Jo Collins is an 66 y.o. female.   HPI:  66 year old female came into the ED with SOB, worsening dry cough, dyspnea, decreased appetite, and back pain. She has been in and out of the ED for these reasons since June 30th. She states that the symptoms have only gotten worse refractory to the doxycycline and inhaler she has been given. She is a smoker but states that she quit a week ago. Denies any fever but has had some chills and fatigue over the last week. She has undergone a thoracentesis for right pleural effusion since this admission. She still has some wheezing. She denies any leg pain, weakness, numbness, or tingling.   Past Medical History:  Diagnosis Date  . GERD (gastroesophageal reflux disease)     Past Surgical History:  Procedure Laterality Date  . ABDOMINAL HYSTERECTOMY    . IR THORACENTESIS ASP PLEURAL SPACE W/IMG GUIDE  08/07/2016    No Known Allergies  Social History  Substance Use Topics  . Smoking status: Former Smoker    Packs/day: 0.50    Years: 47.00    Types: Cigarettes    Quit date: 07/28/2016  . Smokeless tobacco: Never Used  . Alcohol use No    Family History  Problem Relation Age of Onset  . COPD Mother   . Diabetes Mellitus II Sister   . Diabetes Mellitus II Brother   . Diabetes Mellitus II Sister   . Stroke Sister   . Hypertension Sister   . Diabetes Mellitus II Sister      Review of Systems  Positive ROS: All other systems have been reviewed and were otherwise negative with the exception of those mentioned in the HPI and as above.  Objective: Vital signs in last 24 hours: Temp:  [97.8 F (36.6 C)-98.2 F (36.8 C)] 98.2 F (36.8 C) (08/09 1315) Pulse Rate:  [65-70] 70 (08/09 1315) Resp:  [17-18] 17 (08/09 1315) BP: (111-154)/(48-80) 111/48 (08/09 1315) SpO2:  [95 %-97 %] 97 % (08/09 1315)  General Appearance: Alert, cooperative, no  distress, appears stated age Head: Normocephalic, without obvious abnormality, atraumatic Eyes: NA     Ears: NA Neck: Supple, symmetrical Back: Symmetric, no curvature, ROM normal Lungs: respirations unlabored with audible wheezing Heart: Regular rate and rhythm Abdomen: NA Extremities: Extremities normal, atraumatic, no cyanosis or edema Skin: Skin color, texture, turgor normal, no rashes or lesions  NEUROLOGIC:   Mental status: A&O x4, no aphasia, good attention span, Memory and fund of knowledge Motor Exam - grossly normal, normal tone and bulk Sensory Exam - grossly normal Reflexes: symmetric, no pathologic reflexes, No Hoffman's, No clonus Coordination - grossly normal Gait - not tested Balance - not tested Cranial Nerves: I: smell Not tested  II: visual acuity  OS: NA  OD: NA  II: visual fields Full to confrontation  II: pupils Not tested  III,VII: ptosis None  III,IV,VI: extraocular muscles  Full ROM  V: mastication Not tested  V: facial light touch sensation  Not tested  V,VII: corneal reflex  Not tested  VII: facial muscle function - upper  Not tested  VII: facial muscle function - lower Not tested  VIII: hearing Not tested  IX: soft palate elevation  Not tested  IX,X: gag reflex Not tested  XI: trapezius strength    XI: sternocleidomastoid strength 5/5  XI: neck flexion strength  5/5  XII: tongue strength  Data Review Lab Results  Component Value Date   WBC 9.1 08/08/2016   HGB 11.3 (L) 08/08/2016   HCT 35.4 (L) 08/08/2016   MCV 84.3 08/08/2016   PLT 318 08/08/2016   Lab Results  Component Value Date   NA 139 08/09/2016   K 3.8 08/09/2016   CL 108 08/09/2016   CO2 24 08/09/2016   BUN 13 08/09/2016   CREATININE 0.83 08/09/2016   GLUCOSE 97 08/09/2016   Lab Results  Component Value Date   INR 1.09 08/07/2016    Radiology: Dg Chest 1 View  Result Date: 08/07/2016 CLINICAL DATA:  Status post right thoracentesis. EXAM: CHEST 1 VIEW  COMPARISON:  08/06/2016 chest radiograph. FINDINGS: Stable cardiomediastinal silhouette with normal heart size. No pneumothorax. Small right pleural effusion, decreased. No left pleural effusion. No pulmonary edema. Patchy right lung base opacity is mildly decreased. No new consolidative airspace disease. IMPRESSION: 1. No pneumothorax. 2. Small right pleural effusion, decreased . 3. Nonspecific patchy right lung base opacity, decreased. Electronically Signed   By: Ilona Sorrel M.D.   On: 08/07/2016 13:23   Dg Chest 2 View  Result Date: 08/06/2016 CLINICAL DATA:  Acute onset of shortness of breath and lower back pain. Initial encounter. EXAM: CHEST  2 VIEW COMPARISON:  Chest radiograph performed 07/31/2016 FINDINGS: A small-to-moderate right-sided pleural effusion is noted, with right basilar airspace opacification, concerning for pneumonia. No pneumothorax is seen. The left lung appears clear. The heart is borderline normal in size. No acute osseous abnormalities are identified. IMPRESSION: Small to moderate right-sided pleural effusion, with right basilar airspace opacification, concerning for pneumonia. Electronically Signed   By: Garald Balding M.D.   On: 08/06/2016 23:15   Ct Chest Wo Contrast  Result Date: 08/07/2016 CLINICAL DATA:  Dyspnea and lower back pain EXAM: CT CHEST WITHOUT CONTRAST TECHNIQUE: Multidetector CT imaging of the chest was performed following the standard protocol without IV contrast. COMPARISON:  CXR exams dating back through 07/31/2016 FINDINGS: Cardiovascular: Normal size cardiac chambers without pericardial effusion. Mitral annular calcifications are noted. Minimal coronary arteriosclerosis along the distal LAD. 4.2 cm ascending aortic aneurysm. Dilatation of the main pulmonary artery 3.5 cm consistent with a component of pulmonary hypertension. Mediastinum/Nodes: Prevertebral masslike abnormality along the descending thoracic aorta and esophagus possibly representing enlarged  lymph node mass. This is estimated at 7.7 x 4.7 x 8 cm in transverse by AP by craniocaudad dimension. Further assessment is limited due to lack of oral and IV contrast. Lungs/Pleura: Loculated right moderate-sized pleural effusion with compressive atelectasis. Tiny subpleural 4 and 5 mm nodular densities are seen the left upper lobe with pleural-based 4 mm right upper lobe nodular density also noted. These are nonspecific. Upper Abdomen: Retrocrural adenopathy is seen on the left measuring 2.1 cm short axis. Musculoskeletal: Thoracolumbar spondylosis. No acute osseous abnormality. No lytic or blastic disease. IMPRESSION: Confluent prevertebral soft tissue abnormality along the descending thoracic aorta and esophagus. Findings are concerning for an enlarged lymph node mass given retrocrural adenopathy also seen. This confluent masslike abnormality measures 7.7 x 4.7 x 8 cm. Moderate loculated right-sided pleural effusion with compressive atelectasis. Some of the atelectasis could potentially be post obstructive due to the adjacent mediastinal mass. Bilateral 5 mm or less nodular densities in the upper lobes. No follow-up needed if patient is low-risk (and has no known or suspected primary neoplasm). Non-contrast chest CT can be considered in 12 months if patient is high-risk. This recommendation follows the consensus statement: Guidelines for Management of Incidental  Pulmonary Nodules Detected on CT Images: From the Fleischner Society 2017; Radiology 2017; [phone]. Electronically Signed   By: Ashley Royalty M.D.   On: 08/07/2016 00:31   Ir Thoracentesis Asp Pleural Space W/img Guide  Result Date: 08/07/2016 INDICATION: Patient admitted with cough, dyspnea, orthopnea. Imaging reveals a a prevertebral soft tissue abnormality consistent with a mass as well as bilateral nodular densities in the upper lobes of the lungs. A moderate right pleural effusion is noted as well. Request has been made for diagnostic and  therapeutic thoracentesis. EXAM: ULTRASOUND GUIDED DIAGNOSTIC AND THERAPEUTIC THORACENTESIS MEDICATIONS: 1% xylocaine COMPLICATIONS: None immediate. PROCEDURE: An ultrasound guided thoracentesis was thoroughly discussed with the patient and questions answered. The benefits, risks, alternatives and complications were also discussed. The patient understands and wishes to proceed with the procedure. Written consent was obtained. Ultrasound was performed to localize and mark an adequate pocket of fluid in the right chest. The area was then prepped and draped in the normal sterile fashion. 1% Lidocaine was used for local anesthesia. Under ultrasound guidance a Safe-T-Centesis catheter was introduced. Thoracentesis was performed. The catheter was removed and a dressing applied. FINDINGS: A total of approximately 0.7 L of serosanguineous fluid was removed. Samples were sent to the laboratory as requested by the clinical team. IMPRESSION: Successful ultrasound guided right thoracentesis yielding 0.7 L of pleural fluid. Postprocedure chest radiograph shows no pneumothorax. Read by: Saverio Danker, PA-C Electronically Signed   By: Lucrezia Europe M.D.   On: 08/07/2016 13:05     Assessment/Plan: 66 year old comes in today with respiratory symptoms. She does has some back pain but denies any pain or weakness in her legs. She has undergone a thoracentesis for pleural effusion since her admission. She has no history of malignancy. 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 Need to rule out infection vs mass. This is not something that needs neurosurgical intervention at this time. Agree with CCM as she may need to have a biopsy of the mass.    Jo Collins Jo Collins 08/10/2016 3:25 PM   Imaging reviewed. At this point in time there is no indication for surgery. There is very little cord compression if any, and she is neurologically normal, there's no instability, and there are easier ways to get tissue. Pathologic dx  is needed (biopsy to determine dx). I suspect this is tumor more likely than infection, but a cellular dx will guide therapy and prognosis. Please let us know if we be of further assistance.  Sherley Bounds MD

## 2016-08-10 NOTE — Progress Notes (Signed)
PROGRESS NOTE                                                                                                                                                                                                             Patient Demographics:    Jo Collins, is a 66 y.o. female, DOB - 1950-10-31, VEH:[id number]  Admit date - 08/06/2016   Admitting Physician Reubin Milan, MD  Outpatient Primary MD for the patient is Deland Pretty, MD  LOS - 3  Outpatient Specialists:NONE  Chief Complaint  Patient presents with  . Shortness of Breath       Brief Narrative   66 year old female with history of GERD presented to the ED with progressive nonproductive cough, shortness of breath and orthopnea. She also complains of pain in her right upper quadrant. She was seen in the ED 1 week back for similar symptoms and was discharged home with doxycycline and albuterol inhaler. CT scan done in the ED showed prevertebral soft tissue mass along the descending thoracic aorta and esophagus measuring 7.74.78 cm. Also showed diffuse abdominal and retroperitoneal lymphadenopathy. Patient underwent thoracentesis of right pleural effusion which was exudative.   Subjective:   Patient is having pain in her right upper quadrant area. Denies any back pain, pain in her legs, tingling or numbness, bowel or urinary symptoms.   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 Cytology showing reactive mesothelial cells only. 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 Results reviewed with neurosurgeon Dr. Ronnald Ramp on the phone who recommended no surgical intervention given the location of tumor and absence of clinical symptoms.  Right inguinal lymph node was  biopsied by IR. Pending results.  Patient is an active smoker and findings could represent primary lung neoplasm versus metastatic disease.   Right upper quadrant pain Possibly due to right pleural effusion and pleural extension of tumor posteriorly. Will increase frequency of her pain medications. Add when necessary Toradol.  Abnormal EKG with Q waves in anterior leads. 2-D echo shows LVH with normal LVEF.  GERD Continue PPI  Morbid obesity   Code Status : Full code  Family Communication  : Husband and sister at bedside  Disposition Plan  : Pending cytology results   Barriers For Discharge : Active symptoms  Consults  :   PC CM IR  Procedures  : Right thoracentesis CT of the chest and abdomen MRI thoracic spine   DVT Prophylaxis  :  Lovenox -  Lab Results  Component Value Date   PLT 318 08/08/2016    Antibiotics  :    Anti-infectives    None        Objective:   Vitals:   08/09/16 1402 08/09/16 2122 08/10/16 0542 08/10/16 1315  BP: 124/68 (!) 154/80 134/74 (!) 111/48  Pulse: 74 67 65 70  Resp: 16 18 18 17   Temp: 98.4 F (36.9 C) 98.2 F (36.8 C) 97.8 F (36.6 C) 98.2 F (36.8 C)  TempSrc: Oral Oral Oral Oral  SpO2: 97% 97% 95% 97%  Weight:      Height:        Wt Readings from Last 3 Encounters:  08/07/16 129.3 kg (285 lb)  07/31/16 129.3 kg (285 lb)     Intake/Output Summary (Last 24 hours) at 08/10/16 1350 Last data filed at 08/10/16 0545  Gross per 24 hour  Intake              560 ml  Output                0 ml  Net              560 ml     Physical Exam Gen.: Morbidly obese female not in distress HEENT: Moist mucosa, supple neck Chest: Diminished right-sided breath sounds CVS: Normal S1 and S2, no murmurs GI: Soft, nondistended, mild right upper quadrant tenderness Musculoskeletal: Warm, no edema      Data Review:    CBC  Recent Labs Lab 08/06/16 2132 08/08/16 0509  WBC 10.2 9.1  HGB 11.7* 11.3*  HCT 36.5 35.4*    PLT 323 318  MCV 84.1 84.3  MCH 27.0 26.9  MCHC 32.1 31.9  RDW 13.0 13.6  LYMPHSABS 1.6 1.3  MONOABS 0.8 0.5  EOSABS 1.3* 1.0*  BASOSABS 0.0 0.0    Chemistries   Recent Labs Lab 08/06/16 2132 08/07/16 0442 08/08/16 0509 08/09/16 0535  NA 139  --  139 139  K 4.0  --  4.2 3.8  CL 109  --  107 108  CO2 23  --  22 24  GLUCOSE 109*  --  105* 97  BUN 17  --  15 13  CREATININE 1.09*  --  0.92 0.83  CALCIUM 9.1  --  8.7* 8.4*  MG  --   --   --  2.0  AST  --  20  --  17  ALT  --  19  --  15  ALKPHOS  --  51  --  43  BILITOT  --  0.6  --  0.4   ------------------------------------------------------------------------------------------------------------------ No results for input(s): CHOL, HDL, LDLCALC, TRIG, CHOLHDL, LDLDIRECT in the last 72 hours.  No results found for: HGBA1C ------------------------------------------------------------------------------------------------------------------ No results for input(s): TSH, T4TOTAL, T3FREE, THYROIDAB in the last 72 hours.  Invalid input(s): FREET3 ------------------------------------------------------------------------------------------------------------------ No results for input(s): VITAMINB12, FOLATE, FERRITIN, TIBC, IRON, RETICCTPCT in the last 72 hours.  Coagulation profile  Recent Labs Lab 08/07/16 0442  INR 1.09    No results for input(s): DDIMER in the last 72 hours.  Cardiac Enzymes No results for input(s): CKMB, TROPONINI, MYOGLOBIN in the last 168 hours.  Invalid input(s): CK ------------------------------------------------------------------------------------------------------------------  Component Value Date/Time   BNP 39.4 08/07/2016 0023    Inpatient Medications  Scheduled Meds: . heparin  5,000 Units Subcutaneous Q8H  . mouth rinse  15 mL Mouth Rinse BID  . pantoprazole  40 mg Oral Daily   Continuous Infusions: PRN Meds:.ALPRAZolam, ipratropium-albuterol, ketorolac,  oxyCODONE-acetaminophen  Micro Results Recent Results (from the past 240 hour(s))  Culture, blood (Routine X 2) w Reflex to ID Panel     Status: None (Preliminary result)   Collection Time: 08/07/16 12:25 AM  Result Value Ref Range Status   Specimen Description BLOOD RIGHT ANTECUBITAL  Final   Special Requests   Final    BOTTLES DRAWN AEROBIC AND ANAEROBIC Blood Culture adequate volume   Culture NO GROWTH 2 DAYS  Final   Report Status PENDING  Incomplete  Culture, blood (Routine X 2) w Reflex to ID Panel     Status: None (Preliminary result)   Collection Time: 08/07/16 12:45 AM  Result Value Ref Range Status   Specimen Description BLOOD LEFT ANTECUBITAL  Final   Special Requests   Final    BOTTLES DRAWN AEROBIC ONLY Blood Culture adequate volume   Culture NO GROWTH 2 DAYS  Final   Report Status PENDING  Incomplete  Gram stain     Status: None   Collection Time: 08/07/16 12:36 PM  Result Value Ref Range Status   Specimen Description PLEURAL RIGHT  Final   Special Requests NONE  Final   Gram Stain   Final    ABUNDANT WBC PRESENT,BOTH PMN AND MONONUCLEAR NO ORGANISMS SEEN    Report Status 08/07/2016 FINAL  Final  Acid Fast Smear (AFB)     Status: None   Collection Time: 08/07/16  1:02 PM  Result Value Ref Range Status   AFB Specimen Processing Concentration  Final   Acid Fast Smear Negative  Final    Comment: (NOTE) Performed At: [facility] [address] [place], [place] [id number] Lindon Romp MD [id number]    Source (AFB) PLEURAL  Final    Comment: RIGHT  Culture, body fluid-bottle     Status: None (Preliminary result)   Collection Time: 08/07/16  1:02 PM  Result Value Ref Range Status   Specimen Description PLEURAL RIGHT  Final   Special Requests BOTTLES DRAWN AEROBIC AND ANAEROBIC  Final   Culture NO GROWTH 2 DAYS  Final   Report Status PENDING  Incomplete    Radiology Reports Dg Chest 1 View  Result Date: 08/07/2016 CLINICAL DATA:  Status post  right thoracentesis. EXAM: CHEST 1 VIEW COMPARISON:  08/06/2016 chest radiograph. FINDINGS: Stable cardiomediastinal silhouette with normal heart size. No pneumothorax. Small right pleural effusion, decreased. No left pleural effusion. No pulmonary edema. Patchy right lung base opacity is mildly decreased. No new consolidative airspace disease. IMPRESSION: 1. No pneumothorax. 2. Small right pleural effusion, decreased . 3. Nonspecific patchy right lung base opacity, decreased. Electronically Signed   By: Ilona Sorrel M.D.   On: 08/07/2016 13:23   Dg Chest 2 View  Result Date: 08/06/2016 CLINICAL DATA:  Acute onset of shortness of breath and lower back pain. Initial encounter. EXAM: CHEST  2 VIEW COMPARISON:  Chest radiograph performed 07/31/2016 FINDINGS: A small-to-moderate right-sided pleural effusion is noted, with right basilar airspace opacification, concerning for pneumonia. No pneumothorax is seen. The left lung appears clear. The heart is borderline normal in size. No acute osseous abnormalities are identified. IMPRESSION: Small to moderate right-sided pleural effusion, with right basilar airspace opacification, concerning for  pneumonia. Electronically Signed   By: Garald Balding M.D.   On: 08/06/2016 23:15   Dg Chest 2 View  Result Date: 07/31/2016 CLINICAL DATA:  Three days of nonproductive cough associated with shortness of breath and wheezing. Current smoker. EXAM: CHEST  2 VIEW COMPARISON:  Report of a chest x-ray of May 28, 2001. FINDINGS: The left lung is clear. On the right there is volume loss with obscuration of the hemidiaphragm. There is fluid in the minor fissure. There is no mediastinal shift. The heart and pulmonary vascularity are normal. There is mild tortuosity of the ascending thoracic aorta. The bony thorax exhibits no acute abnormality. IMPRESSION: Atelectasis or pneumonia with right pleural E fusion. Followup PA and lateral chest X-ray is recommended in 3-4 weeks following trial  of antibiotic therapy to ensure resolution and exclude underlying malignancy. Electronically Signed   By: David  Martinique M.D.   On: 07/31/2016 16:50   Ct Chest Wo Contrast  Result Date: 08/07/2016 CLINICAL DATA:  Dyspnea and lower back pain EXAM: CT CHEST WITHOUT CONTRAST TECHNIQUE: Multidetector CT imaging of the chest was performed following the standard protocol without IV contrast. COMPARISON:  CXR exams dating back through 07/31/2016 FINDINGS: Cardiovascular: Normal size cardiac chambers without pericardial effusion. Mitral annular calcifications are noted. Minimal coronary arteriosclerosis along the distal LAD. 4.2 cm ascending aortic aneurysm. Dilatation of the main pulmonary artery 3.5 cm consistent with a component of pulmonary hypertension. Mediastinum/Nodes: Prevertebral masslike abnormality along the descending thoracic aorta and esophagus possibly representing enlarged lymph node mass. This is estimated at 7.7 x 4.7 x 8 cm in transverse by AP by craniocaudad dimension. Further assessment is limited due to lack of oral and IV contrast. Lungs/Pleura: Loculated right moderate-sized pleural effusion with compressive atelectasis. Tiny subpleural 4 and 5 mm nodular densities are seen the left upper lobe with pleural-based 4 mm right upper lobe nodular density also noted. These are nonspecific. Upper Abdomen: Retrocrural adenopathy is seen on the left measuring 2.1 cm short axis. Musculoskeletal: Thoracolumbar spondylosis. No acute osseous abnormality. No lytic or blastic disease. IMPRESSION: Confluent prevertebral soft tissue abnormality along the descending thoracic aorta and esophagus. Findings are concerning for an enlarged lymph node mass given retrocrural adenopathy also seen. This confluent masslike abnormality measures 7.7 x 4.7 x 8 cm. Moderate loculated right-sided pleural effusion with compressive atelectasis. Some of the atelectasis could potentially be post obstructive due to the adjacent  mediastinal mass. Bilateral 5 mm or less nodular densities in the upper lobes. No follow-up needed if patient is low-risk (and has no known or suspected primary neoplasm). Non-contrast chest CT can be considered in 12 months if patient is high-risk. This recommendation follows the consensus statement: Guidelines for Management of Incidental Pulmonary Nodules Detected on CT Images: From the Fleischner Society 2017; Radiology 2017; 284:228-243. Electronically Signed   By: Ashley Royalty M.D.   On: 08/07/2016 00:31   Ct Chest W Contrast  Result Date: 08/08/2016 CLINICAL DATA:  66 year old female with mediastinal adenopathy and shortness breath. Post thoracentesis. Cytology pending. Recently quit smoking. Evaluate for metastatic disease. Post abdominal hysterectomy. Subsequent encounter. EXAM: CT CHEST, ABDOMEN, AND PELVIS WITH CONTRAST TECHNIQUE: Multidetector CT imaging of the chest, abdomen and pelvis was performed following the standard protocol during bolus administration of intravenous contrast. CONTRAST:  110mL ISOVUE-300 IOPAMIDOL (ISOVUE-300) INJECTION 61% COMPARISON:  08/07/2016 chest x-ray and chest CT. 05/24/2005 CT of the abdomen and pelvis. FINDINGS: CT CHEST FINDINGS Cardiovascular: Heart size within normal limits. Prominent mitral valve calcifications. Trace  coronary artery calcifications. Prominent ascending thoracic aorta measuring up to 4 cm transverse dimension. Exam not tailored to evaluate for pulmonary embolus. No central pulmonary embolus noted. Main pulmonary artery prominent measuring 3.6 cm which may reflect component of pulmonary hypertension. 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 Question epidural extension of tumor versus engorged epidural veins. Contrast-enhanced MR can be obtained  for further delineation of clinically desired. Lungs/Pleura: Partially loculated right-sided pleural effusion. Pleural extension of tumor posterior inferior right thorax (series 3, images 30 through 36). Confluent atelectasis with air bronchograms right lower lobe right middle lobe. Less confluent atelectasis inferior right upper lobe. No central obstructing mass identified. Tethered pleura with sub pleural thickening greatest lung base. Multiple tiny bilateral nodules throughout all lung zones. Accessory fissure left upper lobe incidentally noted. Trachea and mainstem bronchi are patent. Musculoskeletal: Although thoracic vertebra surrounded by mass, no obvious destruction. Degenerative changes throughout the lower cervical and thoracic spine. CT ABDOMEN PELVIS FINDINGS Hepatobiliary: Significantly enlarged liver spanning over 25.6 cm without focal hepatic lesion noted. No calcified gallstones. Pancreas: No primary pancreatic mass or inflammation. Spleen: No splenic mass or enlargement. Adrenals/Urinary Tract: No hydronephrosis. Left lower pole 1 cm low-density structure statistically likely a cyst. No adrenal mass. Noncontrast filled views of the urinary bladder with limited evaluation secondary to habitus/ artifact. It would be difficult on the present examination to exclude a primary bladder abnormality. Stomach/Bowel: Diverticulosis with regions of muscular hypertrophy. No extraluminal bowel inflammatory process. Limited for evaluating for possibility of bowel/ stomach primary mass secondary to under distension. No obvious abnormality noted. Vascular/Lymphatic: Retroperitoneal, upper abdominal, mesenteric, pelvic and right inguinal adenopathy. Index lymph node right external iliac region measures 5.1 x 3.1 cm. Right inguinal adenopathy which may be assessable to biopsy measures 2.7 x 2 x 2.7 cm. Trace abdominal aortic calcification without aneurysm. No large arterial vessel occlusion. Reproductive: Post  hysterectomy.  No obvious adnexal mass. Other: No free intraperitoneal air. Injection granulomas and subcutaneous gas from injection. Musculoskeletal: No destructive lesion identified. Degenerative changes most prominent L4-5 level with facet degenerative changes, mild anterior slip L4 and bulge contribute to multifactorial spinal stenosis. 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 Question epidural extension of tumor versus engorged epidural veins. Contrast-enhanced MR can be obtained for further delineation of clinically desired. Abdominal region retroperitoneal upper abdominal, mesenteric, pelvic and right inguinal adenopathy. Index lymph node right external iliac region measures 5.1 x 3.1 cm. Right inguinal adenopathy which may be assessable to biopsy measures 2.7 x 2 x 2.7 cm. Partially loculated right-sided pleural effusion. Pleural extension of tumor posterior inferior right thorax (series 3, images 30 through 36). Confluent atelectasis with air bronchograms right lower lobe right middle lobe. Less confluent atelectasis inferior right upper lobe. No central obstructing mass identified. Tethered pleura with sub pleural thickening greatest lung base. Multiple tiny bilateral nodules throughout all lung zones. Findings may reflect tumor such as small cell primary lung neoplasm in this smoker. Adenopathy from lymphoma is a possibility. Adenopathy and spread of tumor to lung from other primary tumor not excluded although other primary lesion not identified (evaluation of bowel limited by under distension without obvious mass). Significantly enlarged liver spanning over 25.6 cm. Aortic Atherosclerosis (ICD10-I70.0).  Prominent ascending thoracic aorta measuring up to 4 cm transverse dimension. Recommend annual imaging  followup by CTA or MRA. This recommendation follows 2010 ACCF/AHA/AATS/ACR/ASA/SCA/SCAI/SIR/STS/SVM Guidelines for the Diagnosis and Management of Patients with Thoracic Aortic Disease. Circulation. 2010; 121: W979-G921 Exam not tailored to evaluate for pulmonary embolus. No central pulmonary embolus noted. Main pulmonary artery prominent measuring 3.6 cm which may reflect component of pulmonary hypertension. Electronically Signed   By: Genia Del M.D.   On: 08/08/2016 07:45   Mr Thoracic Spine W Wo Contrast  Result Date: 08/10/2016 CLINICAL DATA:  66 year old female with mediastinal lymphadenopathy and shortness of breath. Status post thoracentesis 08/07/2016. Prevertebral soft tissue mass by recent chest CT. EXAM: MRI THORACIC WITHOUT AND WITH CONTRAST TECHNIQUE: Multiplanar and multiecho pulse sequences of the thoracic spine were obtained without and with intravenous contrast. CONTRAST:  20 ml MULTIHANCE GADOBENATE DIMEGLUMINE 529 MG/ML IV SOLN COMPARISON:  CT chest 08/09/2015. FINDINGS: MRI THORACIC SPINE FINDINGS Alignment:  Maintained. Vertebrae: No fracture. STIR imaging demonstrates marrow edema in the T8 and T9 vertebral bodies with corresponding decreased T1 signal most consistent with tumor infiltration. More subtle decreased T1 signal is identified in the T7, T10, T11 and likely T12 vertebral bodies worrisome for tumor infiltration. Cord:  Demonstrates normal signal. Paraspinal and other soft tissues: Right pleural effusion is identified. Large prevertebral tumor mass extending along both the right and left sides of multiple vertebral bodies is identified as seen on prior CT scan. There is extension of tumor along the pleura bilaterally. Large right pleural effusion is noted. Disc levels: Multilevel degenerative disease of the cervical spine is seen in the sagittal plane only. Axial imaging is limited but there is epidural tumor extending from approximately T7 through T11 as seen on the sagittal  postcontrast images. Tumor bulk is largest from mid T8 to upper T10 where it is eccentrically prominent to the right. Epidural tumor on the right and anteriorly from the T8-9 to the T9-10 levels compresses and deforms the cord. See axial images 14-21 of series 12. No associated cord signal abnormality is identified. Disc bulging is seen in the upper and mid cervical spine but does not cause central canal stenosis. IMPRESSION: Large paraspinal mass as seen on prior CT scan. Abnormal marrow signal from T7-T11 is most conspicuous in T8 and T9 and consistent with tumor. 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 Epidural tumor is best seen on axial images 14-21 of series 12. Right pleural effusion. Multilevel cervical and thoracic spondylosis. Electronically Signed   By: Inge Rise M.D.   On: 08/10/2016 11:27   Ct Abdomen Pelvis W Contrast  Result Date: 08/08/2016 CLINICAL DATA:  66 year old female with mediastinal adenopathy and shortness breath. Post thoracentesis. Cytology pending. Recently quit smoking. Evaluate for metastatic disease. Post abdominal hysterectomy. Subsequent encounter. EXAM: CT CHEST, ABDOMEN, AND PELVIS WITH CONTRAST TECHNIQUE: Multidetector CT imaging of the chest, abdomen and pelvis was performed following the standard protocol during bolus administration of intravenous contrast. CONTRAST:  198mL ISOVUE-300 IOPAMIDOL (ISOVUE-300) INJECTION 61% COMPARISON:  08/07/2016 chest x-ray and chest CT. 05/24/2005 CT of the abdomen and pelvis. FINDINGS: CT CHEST FINDINGS Cardiovascular: Heart size within normal limits. Prominent mitral valve calcifications. Trace coronary artery calcifications. Prominent ascending thoracic aorta measuring up to 4 cm transverse dimension. Exam not tailored to evaluate for pulmonary embolus. No central pulmonary embolus noted. Main pulmonary artery prominent measuring 3.6 cm which may reflect  component of  pulmonary hypertension. 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 Question epidural extension of tumor versus engorged epidural veins. Contrast-enhanced MR can be obtained for further delineation of clinically desired. Lungs/Pleura: Partially loculated right-sided pleural effusion. Pleural extension of tumor posterior inferior right thorax (series 3, images 30 through 36). Confluent atelectasis with air bronchograms right lower lobe right middle lobe. Less confluent atelectasis inferior right upper lobe. No central obstructing mass identified. Tethered pleura with sub pleural thickening greatest lung base. Multiple tiny bilateral nodules throughout all lung zones. Accessory fissure left upper lobe incidentally noted. Trachea and mainstem bronchi are patent. Musculoskeletal: Although thoracic vertebra surrounded by mass, no obvious destruction. Degenerative changes throughout the lower cervical and thoracic spine. CT ABDOMEN PELVIS FINDINGS Hepatobiliary: Significantly enlarged liver spanning over 25.6 cm without focal hepatic lesion noted. No calcified gallstones. Pancreas: No primary pancreatic mass or inflammation. Spleen: No splenic mass or enlargement. Adrenals/Urinary Tract: No hydronephrosis. Left lower pole 1 cm low-density structure statistically likely a cyst. No adrenal mass. Noncontrast filled views of the urinary bladder with limited evaluation secondary to habitus/ artifact. It would be difficult on the present examination to exclude a primary bladder abnormality. Stomach/Bowel: Diverticulosis with regions of muscular hypertrophy. No extraluminal bowel inflammatory process. Limited for evaluating for possibility of bowel/ stomach primary mass secondary to under distension. No obvious  abnormality noted. Vascular/Lymphatic: Retroperitoneal, upper abdominal, mesenteric, pelvic and right inguinal adenopathy. Index lymph node right external iliac region measures 5.1 x 3.1 cm. Right inguinal adenopathy which may be assessable to biopsy measures 2.7 x 2 x 2.7 cm. Trace abdominal aortic calcification without aneurysm. No large arterial vessel occlusion. Reproductive: Post hysterectomy.  No obvious adnexal mass. Other: No free intraperitoneal air. Injection granulomas and subcutaneous gas from injection. Musculoskeletal: No destructive lesion identified. Degenerative changes most prominent L4-5 level with facet degenerative changes, mild anterior slip L4 and bulge contribute to multifactorial spinal stenosis. 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 Question epidural extension of tumor versus engorged epidural veins. Contrast-enhanced MR can be obtained for further delineation of clinically desired. Abdominal region retroperitoneal upper abdominal, mesenteric, pelvic and right inguinal adenopathy. Index lymph node right external iliac region measures 5.1 x 3.1 cm. Right inguinal adenopathy which may be assessable to biopsy measures 2.7 x 2 x 2.7 cm. Partially loculated right-sided pleural effusion. Pleural extension of tumor posterior inferior right thorax (series 3, images 30 through 36). Confluent atelectasis with air bronchograms right lower lobe right middle lobe. Less confluent atelectasis inferior right upper lobe. No central obstructing mass identified. Tethered pleura with sub pleural thickening greatest lung base. Multiple tiny bilateral nodules throughout all lung zones. Findings may reflect tumor such as small cell primary lung neoplasm in this smoker. Adenopathy from lymphoma is a possibility.  Adenopathy and spread of tumor to lung from other primary tumor not excluded although other primary lesion not identified (evaluation of bowel limited by under distension without obvious mass). Significantly enlarged liver spanning over 25.6 cm. Aortic Atherosclerosis (ICD10-I70.0). Prominent ascending thoracic aorta measuring up to 4 cm transverse dimension. Recommend annual imaging followup by CTA or MRA. This recommendation follows 2010 ACCF/AHA/AATS/ACR/ASA/SCA/SCAI/SIR/STS/SVM Guidelines for the Diagnosis and Management of Patients with Thoracic Aortic Disease. Circulation. 2010; 121:  [phone] Exam not tailored to evaluate for pulmonary embolus. No central pulmonary embolus noted. Main pulmonary artery prominent measuring 3.6 cm which may reflect component of pulmonary hypertension. Electronically Signed   By: Genia Del M.D.   On: 08/08/2016 07:45   US Biopsy  Result Date: 08/09/2016 INDICATION: 66 year old female with newly diagnosed multifocal lymphadenopathy highly concerning for malignant lymphoma. EXAM: Ultrasound-guided core biopsy right inguinal lymph node MEDICATIONS: None. ANESTHESIA/SEDATION: Moderate (conscious) sedation was employed during this procedure. A total of Versed 1.5 mg and Fentanyl 75 mcg was administered intravenously. Moderate Sedation Time: 13 minutes. The patient's level of consciousness and vital signs were monitored continuously by radiology nursing throughout the procedure under my direct supervision. FLUOROSCOPY TIME:  Fluoroscopy Time: 0 minutes 0 seconds (0 mGy). COMPLICATIONS: None immediate. PROCEDURE: Informed written consent was obtained from the patient after a thorough discussion of the procedural risks, benefits and alternatives. All questions were addressed. A timeout was performed prior to the initiation of the procedure. The right inguinal region was interrogated with ultrasound. A hypoechoic lymph node with internal vascularity measures 2.7 x 1.4 cm. A  suitable skin entry site was selected and marked. Following sterile prep and drape in the standard fashion with chlorhexidine skin prep, local anesthesia was attained by infiltration with 1% lidocaine. A small dermatotomy was made. Using real-time sonographic guidance, multiple 16 gauge core biopsies were obtained using the Bard automated biopsy device. Biopsy specimens were placed in saline and delivered to pathology for further analysis. Post biopsy ultrasound imaging demonstrates no evidence of active bleeding or complication. IMPRESSION: Technically successful core biopsy of abnormal right inguinal lymph node. Signed, Criselda Peaches, MD [facility] Electronically Signed   By: Jacqulynn Cadet M.D.   On: 08/09/2016 13:12   Dg Chest Port 1 View  Result Date: 08/08/2016 CLINICAL DATA:  Respiratory failure EXAM: PORTABLE CHEST 1 VIEW COMPARISON:  08/07/2016 FINDINGS: Right pleural effusion and right lower lobe airspace disease unchanged. Left lung remains clear. Heart size normal. Negative for heart failure. IMPRESSION: Right lower lobe airspace disease and right pleural effusion unchanged. Electronically Signed   By: Franchot Gallo M.D.   On: 08/08/2016 07:46   Ir Thoracentesis Asp Pleural Space W/img Guide  Result Date: 08/07/2016 INDICATION: Patient admitted with cough, dyspnea, orthopnea. Imaging reveals a a prevertebral soft tissue abnormality consistent with a mass as well as bilateral nodular densities in the upper lobes of the lungs. A moderate right pleural effusion is noted as well. Request has been made for diagnostic and therapeutic thoracentesis. EXAM: ULTRASOUND GUIDED DIAGNOSTIC AND THERAPEUTIC THORACENTESIS MEDICATIONS: 1% xylocaine COMPLICATIONS: None immediate. PROCEDURE: An ultrasound guided thoracentesis was thoroughly discussed with the patient and questions answered. The benefits, risks, alternatives and complications were  also discussed. The patient understands and wishes to proceed with the procedure. Written consent was obtained. Ultrasound was performed to localize and mark an adequate pocket of fluid in the right chest. The area was then prepped and draped in the normal sterile fashion. 1% Lidocaine was used for local anesthesia. Under ultrasound guidance a Safe-T-Centesis catheter was introduced. Thoracentesis was performed. The catheter was removed and a dressing applied. FINDINGS: A total of approximately 0.7 L of serosanguineous fluid was removed. Samples were sent to the laboratory as requested by the clinical team. IMPRESSION: Successful ultrasound guided right thoracentesis yielding 0.7 L of pleural fluid. Postprocedure chest radiograph shows no pneumothorax. Read by: Saverio Danker, PA-C Electronically Signed   By: Lucrezia Europe  M.D.   On: 08/07/2016 13:05    Time Spent in minutes  25   Louellen Molder M.D on 08/10/2016 at 1:50 PM  Between 7am to 7pm - Pager - [phone]  After 7pm go to [email or web address] - password [facility]  [facility] -  Office  [phone]

## 2016-08-11 MED ORDER — KETOROLAC TROMETHAMINE 15 MG/ML IJ SOLN: 15.0000 mg | Freq: Four times a day (QID) | INTRAMUSCULAR | Status: DC | PRN

## 2016-08-11 MED ORDER — OXYCODONE-ACETAMINOPHEN 5-325 MG PO TABS
1.0000 | ORAL_TABLET | ORAL | Status: DC | PRN
  Administered 2016-08-11 – 2016-08-14 (×6): 1 via ORAL
  Filled 2016-08-11 (×6): qty 1

## 2016-08-11 MED ORDER — KETOROLAC TROMETHAMINE 30 MG/ML IJ SOLN: 30.0000 mg | Freq: Four times a day (QID) | INTRAMUSCULAR | Status: DC

## 2016-08-11 MED ORDER — TRAMADOL HCL 50 MG PO TABS
50.0000 mg | ORAL_TABLET | Freq: Four times a day (QID) | ORAL | Status: DC
  Administered 2016-08-11 – 2016-08-13 (×6): 50 mg via ORAL
  Filled 2016-08-11 (×7): qty 1

## 2016-08-11 MED ORDER — KETOROLAC TROMETHAMINE 30 MG/ML IJ SOLN
30.0000 mg | Freq: Four times a day (QID) | INTRAMUSCULAR | Status: AC | PRN
Start: 2016-08-11 — End: 2016-08-14
  Administered 2016-08-11 – 2016-08-12 (×2): 30 mg via INTRAVENOUS
  Filled 2016-08-11 (×3): qty 1

## 2016-08-11 MED ORDER — LIDOCAINE 5 % EX PTCH
1.0000 | MEDICATED_PATCH | CUTANEOUS | Status: DC
  Administered 2016-08-11 – 2016-08-14 (×4): 1 via TRANSDERMAL
  Filled 2016-08-11 (×4): qty 1

## 2016-08-11 NOTE — Care Management Important Message (Signed)
Important Message  Patient Details  Name: Jo Collins MRN: [id number] Date of Birth: 06/21/1950   Medicare Important Message Given:  Yes    Genita Nilsson 08/11/2016, 1:46 PM

## 2016-08-11 NOTE — Progress Notes (Signed)
PROGRESS NOTE                                                                                                                                                                                                             Patient Demographics:    Jo Collins, is a 66 y.o. female, DOB - March 19, 1950, XMI:[id number]  Admit date - 08/06/2016   Admitting Physician Reubin Milan, MD  Outpatient Primary MD for the patient is Deland Pretty, MD  LOS - 4  Outpatient Specialists:NONE  Chief Complaint  Patient presents with  . Shortness of Breath       Brief Narrative   66 year old female with history of GERD presented to the ED with progressive nonproductive cough, shortness of breath and orthopnea. She also complains of pain in her right upper quadrant. She was seen in the ED 1 week back for similar symptoms and was discharged home with doxycycline and albuterol inhaler. CT scan done in the ED showed prevertebral soft tissue mass along the descending thoracic aorta and esophagus measuring 7.74.78 cm. Also showed diffuse abdominal and retroperitoneal lymphadenopathy. Patient underwent thoracentesis of right pleural effusion which was exudative.   Subjective:   Still having some pain in RUQ and back area. 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 Cytology showing reactive mesothelial cells only. 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 Results reviewed with neurosurgeon Dr. Ronnald Ramp  who recommended no surgical intervention given the location of tumor and absence of clinical symptoms.  Right inguinal lymph node was biopsied by IR. Pending results. ( discussed with  pathologist who informed the result was inconsistent and was running more tests for diagnosis)    Right upper quadrant pain Possibly due to right pleural effusion and pleural extension of tumor posteriorly. Increased dose of toradol and added lidoderm patch. Will add tramadol if symptoms not better.   Abnormal EKG with Q waves in anterior leads. 2-D echo shows LVH with normal LVEF.  GERD Continue PPI  Morbid obesity   Code Status : Full code  Family Communication  : Husband at bedside  Disposition Plan  : Pending cytology results   Barriers For Discharge : Active symptoms  Consults  :   PC CM IR  Procedures  : Right thoracentesis CT of the chest and abdomen MRI thoracic spine   DVT Prophylaxis  :  Lovenox -  Lab Results  Component Value Date   PLT 318 08/08/2016    Antibiotics  :    Anti-infectives    None        Objective:   Vitals:   08/09/16 2122 08/10/16 0542 08/10/16 1315 08/11/16 0631  BP: (!) 154/80 134/74 (!) 111/48 131/79  Pulse: 67 65 70 64  Resp: 18 18 17 18   Temp: 98.2 F (36.8 C) 97.8 F (36.6 C) 98.2 F (36.8 C) 97.9 F (36.6 C)  TempSrc: Oral Oral Oral   SpO2: 97% 95% 97% 98%  Weight:      Height:        Wt Readings from Last 3 Encounters:  08/07/16 129.3 kg (285 lb)  07/31/16 129.3 kg (285 lb)     Intake/Output Summary (Last 24 hours) at 08/11/16 1753 Last data filed at 08/11/16 1349  Gross per 24 hour  Intake             1200 ml  Output                0 ml  Net             1200 ml   Physical exam: NAD  HEENT: moist mucosa, supple neck  chest: diminished breath sounds over rt lung CVS: N S1&S2, no murmurs GI: soft, NT, ND, Rt upper quadrant / back tenderness Musculoskeletal: warm, no edema        Data Review:    CBC  Recent Labs Lab 08/06/16 2132 08/08/16 0509  WBC 10.2 9.1  HGB 11.7* 11.3*  HCT 36.5 35.4*  PLT 323 318  MCV 84.1 84.3  MCH 27.0 26.9  MCHC 32.1 31.9  RDW 13.0 13.6  LYMPHSABS 1.6  1.3  MONOABS 0.8 0.5  EOSABS 1.3* 1.0*  BASOSABS 0.0 0.0    Chemistries   Recent Labs Lab 08/06/16 2132 08/07/16 0442 08/08/16 0509 08/09/16 0535  NA 139  --  139 139  K 4.0  --  4.2 3.8  CL 109  --  107 108  CO2 23  --  22 24  GLUCOSE 109*  --  105* 97  BUN 17  --  15 13  CREATININE 1.09*  --  0.92 0.83  CALCIUM 9.1  --  8.7* 8.4*  MG  --   --   --  2.0  AST  --  20  --  17  ALT  --  19  --  15  ALKPHOS  --  51  --  43  BILITOT  --  0.6  --  0.4   ------------------------------------------------------------------------------------------------------------------ No results for input(s): CHOL, HDL, LDLCALC, TRIG, CHOLHDL, LDLDIRECT in the last 72 hours.  No results found for: HGBA1C ------------------------------------------------------------------------------------------------------------------ No results for input(s): TSH, T4TOTAL, T3FREE, THYROIDAB in the last 72 hours.  Invalid input(s): FREET3 ------------------------------------------------------------------------------------------------------------------ No results for input(s): VITAMINB12, FOLATE, FERRITIN, TIBC, IRON, RETICCTPCT in the last 72 hours.  Coagulation profile  Recent Labs Lab 08/07/16 0442  INR 1.09    No results for input(s): DDIMER in the last 72 hours.  Cardiac Enzymes No results for input(s): CKMB, TROPONINI, MYOGLOBIN in the last 168 hours.  Invalid input(s): CK ------------------------------------------------------------------------------------------------------------------    Component  Value Date/Time   BNP 39.4 08/07/2016 0023    Inpatient Medications  Scheduled Meds: . heparin  5,000 Units Subcutaneous Q8H  . lidocaine  1 patch Transdermal Q24H  . mouth rinse  15 mL Mouth Rinse BID  . pantoprazole  40 mg Oral Daily   Continuous Infusions: PRN Meds:.ALPRAZolam, ipratropium-albuterol, ketorolac, oxyCODONE-acetaminophen  Micro Results Recent Results (from the past 240  hour(s))  Culture, blood (Routine X 2) w Reflex to ID Panel     Status: None (Preliminary result)   Collection Time: 08/07/16 12:25 AM  Result Value Ref Range Status   Specimen Description BLOOD RIGHT ANTECUBITAL  Final   Special Requests   Final    BOTTLES DRAWN AEROBIC AND ANAEROBIC Blood Culture adequate volume   Culture NO GROWTH 4 DAYS  Final   Report Status PENDING  Incomplete  Culture, blood (Routine X 2) w Reflex to ID Panel     Status: None (Preliminary result)   Collection Time: 08/07/16 12:45 AM  Result Value Ref Range Status   Specimen Description BLOOD LEFT ANTECUBITAL  Final   Special Requests   Final    BOTTLES DRAWN AEROBIC ONLY Blood Culture adequate volume   Culture NO GROWTH 4 DAYS  Final   Report Status PENDING  Incomplete  Gram stain     Status: None   Collection Time: 08/07/16 12:36 PM  Result Value Ref Range Status   Specimen Description PLEURAL RIGHT  Final   Special Requests NONE  Final   Gram Stain   Final    ABUNDANT WBC PRESENT,BOTH PMN AND MONONUCLEAR NO ORGANISMS SEEN    Report Status 08/07/2016 FINAL  Final  Acid Fast Smear (AFB)     Status: None   Collection Time: 08/07/16  1:02 PM  Result Value Ref Range Status   AFB Specimen Processing Concentration  Final   Acid Fast Smear Negative  Final    Comment: (NOTE) Performed At: [facility] [address] [place], [place] [id number] Lindon Romp MD [id number]    Source (AFB) PLEURAL  Final    Comment: RIGHT  Culture, body fluid-bottle     Status: None (Preliminary result)   Collection Time: 08/07/16  1:02 PM  Result Value Ref Range Status   Specimen Description PLEURAL RIGHT  Final   Special Requests BOTTLES DRAWN AEROBIC AND ANAEROBIC  Final   Culture NO GROWTH 4 DAYS  Final   Report Status PENDING  Incomplete    Radiology Reports Dg Chest 1 View  Result Date: 08/07/2016 CLINICAL DATA:  Status post right thoracentesis. EXAM: CHEST 1 VIEW COMPARISON:  08/06/2016 chest  radiograph. FINDINGS: Stable cardiomediastinal silhouette with normal heart size. No pneumothorax. Small right pleural effusion, decreased. No left pleural effusion. No pulmonary edema. Patchy right lung base opacity is mildly decreased. No new consolidative airspace disease. IMPRESSION: 1. No pneumothorax. 2. Small right pleural effusion, decreased . 3. Nonspecific patchy right lung base opacity, decreased. Electronically Signed   By: Ilona Sorrel M.D.   On: 08/07/2016 13:23   Dg Chest 2 View  Result Date: 08/06/2016 CLINICAL DATA:  Acute onset of shortness of breath and lower back pain. Initial encounter. EXAM: CHEST  2 VIEW COMPARISON:  Chest radiograph performed 07/31/2016 FINDINGS: A small-to-moderate right-sided pleural effusion is noted, with right basilar airspace opacification, concerning for pneumonia. No pneumothorax is seen. The left lung appears clear. The heart is borderline normal in size. No acute osseous abnormalities are identified. IMPRESSION: Small to moderate right-sided pleural effusion,  with right basilar airspace opacification, concerning for pneumonia. Electronically Signed   By: Garald Balding M.D.   On: 08/06/2016 23:15   Dg Chest 2 View  Result Date: 07/31/2016 CLINICAL DATA:  Three days of nonproductive cough associated with shortness of breath and wheezing. Current smoker. EXAM: CHEST  2 VIEW COMPARISON:  Report of a chest x-ray of May 28, 2001. FINDINGS: The left lung is clear. On the right there is volume loss with obscuration of the hemidiaphragm. There is fluid in the minor fissure. There is no mediastinal shift. The heart and pulmonary vascularity are normal. There is mild tortuosity of the ascending thoracic aorta. The bony thorax exhibits no acute abnormality. IMPRESSION: Atelectasis or pneumonia with right pleural E fusion. Followup PA and lateral chest X-ray is recommended in 3-4 weeks following trial of antibiotic therapy to ensure resolution and exclude underlying  malignancy. Electronically Signed   By: David  Martinique M.D.   On: 07/31/2016 16:50   Ct Chest Wo Contrast  Result Date: 08/07/2016 CLINICAL DATA:  Dyspnea and lower back pain EXAM: CT CHEST WITHOUT CONTRAST TECHNIQUE: Multidetector CT imaging of the chest was performed following the standard protocol without IV contrast. COMPARISON:  CXR exams dating back through 07/31/2016 FINDINGS: Cardiovascular: Normal size cardiac chambers without pericardial effusion. Mitral annular calcifications are noted. Minimal coronary arteriosclerosis along the distal LAD. 4.2 cm ascending aortic aneurysm. Dilatation of the main pulmonary artery 3.5 cm consistent with a component of pulmonary hypertension. Mediastinum/Nodes: Prevertebral masslike abnormality along the descending thoracic aorta and esophagus possibly representing enlarged lymph node mass. This is estimated at 7.7 x 4.7 x 8 cm in transverse by AP by craniocaudad dimension. Further assessment is limited due to lack of oral and IV contrast. Lungs/Pleura: Loculated right moderate-sized pleural effusion with compressive atelectasis. Tiny subpleural 4 and 5 mm nodular densities are seen the left upper lobe with pleural-based 4 mm right upper lobe nodular density also noted. These are nonspecific. Upper Abdomen: Retrocrural adenopathy is seen on the left measuring 2.1 cm short axis. Musculoskeletal: Thoracolumbar spondylosis. No acute osseous abnormality. No lytic or blastic disease. IMPRESSION: Confluent prevertebral soft tissue abnormality along the descending thoracic aorta and esophagus. Findings are concerning for an enlarged lymph node mass given retrocrural adenopathy also seen. This confluent masslike abnormality measures 7.7 x 4.7 x 8 cm. Moderate loculated right-sided pleural effusion with compressive atelectasis. Some of the atelectasis could potentially be post obstructive due to the adjacent mediastinal mass. Bilateral 5 mm or less nodular densities in the upper  lobes. No follow-up needed if patient is low-risk (and has no known or suspected primary neoplasm). Non-contrast chest CT can be considered in 12 months if patient is high-risk. This recommendation follows the consensus statement: Guidelines for Management of Incidental Pulmonary Nodules Detected on CT Images: From the Fleischner Society 2017; Radiology 2017; 284:228-243. Electronically Signed   By: Ashley Royalty M.D.   On: 08/07/2016 00:31   Ct Chest W Contrast  Result Date: 08/08/2016 CLINICAL DATA:  66 year old female with mediastinal adenopathy and shortness breath. Post thoracentesis. Cytology pending. Recently quit smoking. Evaluate for metastatic disease. Post abdominal hysterectomy. Subsequent encounter. EXAM: CT CHEST, ABDOMEN, AND PELVIS WITH CONTRAST TECHNIQUE: Multidetector CT imaging of the chest, abdomen and pelvis was performed following the standard protocol during bolus administration of intravenous contrast. CONTRAST:  154mL ISOVUE-300 IOPAMIDOL (ISOVUE-300) INJECTION 61% COMPARISON:  08/07/2016 chest x-ray and chest CT. 05/24/2005 CT of the abdomen and pelvis. FINDINGS: CT CHEST FINDINGS Cardiovascular: Heart size within  normal limits. Prominent mitral valve calcifications. Trace coronary artery calcifications. Prominent ascending thoracic aorta measuring up to 4 cm transverse dimension. Exam not tailored to evaluate for pulmonary embolus. No central pulmonary embolus noted. Main pulmonary artery prominent measuring 3.6 cm which may reflect component of pulmonary hypertension. 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 Question epidural extension of tumor versus engorged epidural veins. Contrast-enhanced MR can be obtained for further delineation of clinically desired. Lungs/Pleura: Partially  loculated right-sided pleural effusion. Pleural extension of tumor posterior inferior right thorax (series 3, images 30 through 36). Confluent atelectasis with air bronchograms right lower lobe right middle lobe. Less confluent atelectasis inferior right upper lobe. No central obstructing mass identified. Tethered pleura with sub pleural thickening greatest lung base. Multiple tiny bilateral nodules throughout all lung zones. Accessory fissure left upper lobe incidentally noted. Trachea and mainstem bronchi are patent. Musculoskeletal: Although thoracic vertebra surrounded by mass, no obvious destruction. Degenerative changes throughout the lower cervical and thoracic spine. CT ABDOMEN PELVIS FINDINGS Hepatobiliary: Significantly enlarged liver spanning over 25.6 cm without focal hepatic lesion noted. No calcified gallstones. Pancreas: No primary pancreatic mass or inflammation. Spleen: No splenic mass or enlargement. Adrenals/Urinary Tract: No hydronephrosis. Left lower pole 1 cm low-density structure statistically likely a cyst. No adrenal mass. Noncontrast filled views of the urinary bladder with limited evaluation secondary to habitus/ artifact. It would be difficult on the present examination to exclude a primary bladder abnormality. Stomach/Bowel: Diverticulosis with regions of muscular hypertrophy. No extraluminal bowel inflammatory process. Limited for evaluating for possibility of bowel/ stomach primary mass secondary to under distension. No obvious abnormality noted. Vascular/Lymphatic: Retroperitoneal, upper abdominal, mesenteric, pelvic and right inguinal adenopathy. Index lymph node right external iliac region measures 5.1 x 3.1 cm. Right inguinal adenopathy which may be assessable to biopsy measures 2.7 x 2 x 2.7 cm. Trace abdominal aortic calcification without aneurysm. No large arterial vessel occlusion. Reproductive: Post hysterectomy.  No obvious adnexal mass. Other: No free intraperitoneal air.  Injection granulomas and subcutaneous gas from injection. Musculoskeletal: No destructive lesion identified. Degenerative changes most prominent L4-5 level with facet degenerative changes, mild anterior slip L4 and bulge contribute to multifactorial spinal stenosis. 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 Question epidural extension of tumor versus engorged epidural veins. Contrast-enhanced MR can be obtained for further delineation of clinically desired. Abdominal region retroperitoneal upper abdominal, mesenteric, pelvic and right inguinal adenopathy. Index lymph node right external iliac region measures 5.1 x 3.1 cm. Right inguinal adenopathy which may be assessable to biopsy measures 2.7 x 2 x 2.7 cm. Partially loculated right-sided pleural effusion. Pleural extension of tumor posterior inferior right thorax (series 3, images 30 through 36). Confluent atelectasis with air bronchograms right lower lobe right middle lobe. Less confluent atelectasis inferior right upper lobe. No central obstructing mass identified. Tethered pleura with sub pleural thickening greatest lung base. Multiple tiny bilateral nodules throughout all lung zones. Findings may reflect tumor such as small cell primary lung neoplasm in this smoker. Adenopathy from lymphoma is a possibility. Adenopathy and spread of tumor to lung from other primary tumor not excluded although other primary lesion not identified (evaluation of bowel limited by under distension without obvious mass). Significantly enlarged liver  spanning over 25.6 cm. Aortic Atherosclerosis (ICD10-I70.0). Prominent ascending thoracic aorta measuring up to 4 cm transverse dimension. Recommend annual imaging followup by CTA or MRA. This recommendation follows 2010  ACCF/AHA/AATS/ACR/ASA/SCA/SCAI/SIR/STS/SVM Guidelines for the Diagnosis and Management of Patients with Thoracic Aortic Disease. Circulation. 2010; 121: F751-W258 Exam not tailored to evaluate for pulmonary embolus. No central pulmonary embolus noted. Main pulmonary artery prominent measuring 3.6 cm which may reflect component of pulmonary hypertension. Electronically Signed   By: Genia Del M.D.   On: 08/08/2016 07:45   Mr Thoracic Spine W Wo Contrast  Result Date: 08/10/2016 CLINICAL DATA:  66 year old female with mediastinal lymphadenopathy and shortness of breath. Status post thoracentesis 08/07/2016. Prevertebral soft tissue mass by recent chest CT. EXAM: MRI THORACIC WITHOUT AND WITH CONTRAST TECHNIQUE: Multiplanar and multiecho pulse sequences of the thoracic spine were obtained without and with intravenous contrast. CONTRAST:  20 ml MULTIHANCE GADOBENATE DIMEGLUMINE 529 MG/ML IV SOLN COMPARISON:  CT chest 08/09/2015. FINDINGS: MRI THORACIC SPINE FINDINGS Alignment:  Maintained. Vertebrae: No fracture. STIR imaging demonstrates marrow edema in the T8 and T9 vertebral bodies with corresponding decreased T1 signal most consistent with tumor infiltration. More subtle decreased T1 signal is identified in the T7, T10, T11 and likely T12 vertebral bodies worrisome for tumor infiltration. Cord:  Demonstrates normal signal. Paraspinal and other soft tissues: Right pleural effusion is identified. Large prevertebral tumor mass extending along both the right and left sides of multiple vertebral bodies is identified as seen on prior CT scan. There is extension of tumor along the pleura bilaterally. Large right pleural effusion is noted. Disc levels: Multilevel degenerative disease of the cervical spine is seen in the sagittal plane only. Axial imaging is limited but there is epidural tumor extending from approximately T7 through T11 as seen on the sagittal postcontrast images. Tumor bulk is largest from mid T8 to  upper T10 where it is eccentrically prominent to the right. Epidural tumor on the right and anteriorly from the T8-9 to the T9-10 levels compresses and deforms the cord. See axial images 14-21 of series 12. No associated cord signal abnormality is identified. Disc bulging is seen in the upper and mid cervical spine but does not cause central canal stenosis. IMPRESSION: Large paraspinal mass as seen on prior CT scan. Abnormal marrow signal from T7-T11 is most conspicuous in T8 and T9 and consistent with tumor. 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 Epidural tumor is best seen on axial images 14-21 of series 12. Right pleural effusion. Multilevel cervical and thoracic spondylosis. Electronically Signed   By: Inge Rise M.D.   On: 08/10/2016 11:27   Ct Abdomen Pelvis W Contrast  Result Date: 08/08/2016 CLINICAL DATA:  66 year old female with mediastinal adenopathy and shortness breath. Post thoracentesis. Cytology pending. Recently quit smoking. Evaluate for metastatic disease. Post abdominal hysterectomy. Subsequent encounter. EXAM: CT CHEST, ABDOMEN, AND PELVIS WITH CONTRAST TECHNIQUE: Multidetector CT imaging of the chest, abdomen and pelvis was performed following the standard protocol during bolus administration of intravenous contrast. CONTRAST:  155mL ISOVUE-300 IOPAMIDOL (ISOVUE-300) INJECTION 61% COMPARISON:  08/07/2016 chest x-ray and chest CT. 05/24/2005 CT of the abdomen and pelvis. FINDINGS: CT CHEST FINDINGS Cardiovascular: Heart size within normal limits. Prominent mitral valve calcifications. Trace coronary artery calcifications. Prominent ascending thoracic aorta measuring up to 4 cm transverse dimension. Exam not tailored to evaluate for pulmonary embolus. No central pulmonary embolus noted. Main pulmonary artery prominent measuring 3.6  cm which may reflect component of pulmonary hypertension. Mediastinum/Nodes:  Paravertebral mass envelops vertebra and descending thoracic aorta (displacing and compressing the esophagus anteriorly) suspicious for confluent adenopathy with maximal transverse dimension of 8.8 x 8.6 cm spanning from T4 -T12. Question epidural extension of tumor versus engorged epidural veins. Contrast-enhanced MR can be obtained for further delineation of clinically desired. Lungs/Pleura: Partially loculated right-sided pleural effusion. Pleural extension of tumor posterior inferior right thorax (series 3, images 30 through 36). Confluent atelectasis with air bronchograms right lower lobe right middle lobe. Less confluent atelectasis inferior right upper lobe. No central obstructing mass identified. Tethered pleura with sub pleural thickening greatest lung base. Multiple tiny bilateral nodules throughout all lung zones. Accessory fissure left upper lobe incidentally noted. Trachea and mainstem bronchi are patent. Musculoskeletal: Although thoracic vertebra surrounded by mass, no obvious destruction. Degenerative changes throughout the lower cervical and thoracic spine. CT ABDOMEN PELVIS FINDINGS Hepatobiliary: Significantly enlarged liver spanning over 25.6 cm without focal hepatic lesion noted. No calcified gallstones. Pancreas: No primary pancreatic mass or inflammation. Spleen: No splenic mass or enlargement. Adrenals/Urinary Tract: No hydronephrosis. Left lower pole 1 cm low-density structure statistically likely a cyst. No adrenal mass. Noncontrast filled views of the urinary bladder with limited evaluation secondary to habitus/ artifact. It would be difficult on the present examination to exclude a primary bladder abnormality. Stomach/Bowel: Diverticulosis with regions of muscular hypertrophy. No extraluminal bowel inflammatory process. Limited for evaluating for possibility of bowel/ stomach primary mass secondary to under distension. No obvious abnormality noted. Vascular/Lymphatic: Retroperitoneal,  upper abdominal, mesenteric, pelvic and right inguinal adenopathy. Index lymph node right external iliac region measures 5.1 x 3.1 cm. Right inguinal adenopathy which may be assessable to biopsy measures 2.7 x 2 x 2.7 cm. Trace abdominal aortic calcification without aneurysm. No large arterial vessel occlusion. Reproductive: Post hysterectomy.  No obvious adnexal mass. Other: No free intraperitoneal air. Injection granulomas and subcutaneous gas from injection. Musculoskeletal: No destructive lesion identified. Degenerative changes most prominent L4-5 level with facet degenerative changes, mild anterior slip L4 and bulge contribute to multifactorial spinal stenosis. 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 Question epidural extension of tumor versus engorged epidural veins. Contrast-enhanced MR can be obtained for further delineation of clinically desired. Abdominal region retroperitoneal upper abdominal, mesenteric, pelvic and right inguinal adenopathy. Index lymph node right external iliac region measures 5.1 x 3.1 cm. Right inguinal adenopathy which may be assessable to biopsy measures 2.7 x 2 x 2.7 cm. Partially loculated right-sided pleural effusion. Pleural extension of tumor posterior inferior right thorax (series 3, images 30 through 36). Confluent atelectasis with air bronchograms right lower lobe right middle lobe. Less confluent atelectasis inferior right upper lobe. No central obstructing mass identified. Tethered pleura with sub pleural thickening greatest lung base. Multiple tiny bilateral nodules throughout all lung zones. Findings may reflect tumor such as small cell primary lung neoplasm in this smoker. Adenopathy from lymphoma is a possibility. Adenopathy and spread of tumor to lung from other primary  tumor not excluded although other primary lesion not identified (evaluation of bowel limited by under distension without obvious mass). Significantly enlarged liver spanning over 25.6 cm. Aortic Atherosclerosis (ICD10-I70.0). Prominent ascending thoracic aorta measuring up to 4 cm transverse dimension. Recommend annual imaging followup by CTA or MRA. This recommendation follows 2010 ACCF/AHA/AATS/ACR/ASA/SCA/SCAI/SIR/STS/SVM Guidelines for the Diagnosis and Management of Patients  with Thoracic Aortic Disease. Circulation. 2010; 121: E703-J009 Exam not tailored to evaluate for pulmonary embolus. No central pulmonary embolus noted. Main pulmonary artery prominent measuring 3.6 cm which may reflect component of pulmonary hypertension. Electronically Signed   By: Genia Del M.D.   On: 08/08/2016 07:45   US Biopsy  Result Date: 08/09/2016 INDICATION: 66 year old female with newly diagnosed multifocal lymphadenopathy highly concerning for malignant lymphoma. EXAM: Ultrasound-guided core biopsy right inguinal lymph node MEDICATIONS: None. ANESTHESIA/SEDATION: Moderate (conscious) sedation was employed during this procedure. A total of Versed 1.5 mg and Fentanyl 75 mcg was administered intravenously. Moderate Sedation Time: 13 minutes. The patient's level of consciousness and vital signs were monitored continuously by radiology nursing throughout the procedure under my direct supervision. FLUOROSCOPY TIME:  Fluoroscopy Time: 0 minutes 0 seconds (0 mGy). COMPLICATIONS: None immediate. PROCEDURE: Informed written consent was obtained from the patient after a thorough discussion of the procedural risks, benefits and alternatives. All questions were addressed. A timeout was performed prior to the initiation of the procedure. The right inguinal region was interrogated with ultrasound. A hypoechoic lymph node with internal vascularity measures 2.7 x 1.4 cm. A suitable skin entry site was selected and marked. Following  sterile prep and drape in the standard fashion with chlorhexidine skin prep, local anesthesia was attained by infiltration with 1% lidocaine. A small dermatotomy was made. Using real-time sonographic guidance, multiple 16 gauge core biopsies were obtained using the Bard automated biopsy device. Biopsy specimens were placed in saline and delivered to pathology for further analysis. Post biopsy ultrasound imaging demonstrates no evidence of active bleeding or complication. IMPRESSION: Technically successful core biopsy of abnormal right inguinal lymph node. Signed, Criselda Peaches, MD [facility] Electronically Signed   By: Jacqulynn Cadet M.D.   On: 08/09/2016 13:12   Dg Chest Port 1 View  Result Date: 08/08/2016 CLINICAL DATA:  Respiratory failure EXAM: PORTABLE CHEST 1 VIEW COMPARISON:  08/07/2016 FINDINGS: Right pleural effusion and right lower lobe airspace disease unchanged. Left lung remains clear. Heart size normal. Negative for heart failure. IMPRESSION: Right lower lobe airspace disease and right pleural effusion unchanged. Electronically Signed   By: Franchot Gallo M.D.   On: 08/08/2016 07:46   Ir Thoracentesis Asp Pleural Space W/img Guide  Result Date: 08/07/2016 INDICATION: Patient admitted with cough, dyspnea, orthopnea. Imaging reveals a a prevertebral soft tissue abnormality consistent with a mass as well as bilateral nodular densities in the upper lobes of the lungs. A moderate right pleural effusion is noted as well. Request has been made for diagnostic and therapeutic thoracentesis. EXAM: ULTRASOUND GUIDED DIAGNOSTIC AND THERAPEUTIC THORACENTESIS MEDICATIONS: 1% xylocaine COMPLICATIONS: None immediate. PROCEDURE: An ultrasound guided thoracentesis was thoroughly discussed with the patient and questions answered. The benefits, risks, alternatives and complications were also discussed. The patient understands and wishes to proceed  with the procedure. Written consent was obtained. Ultrasound was performed to localize and mark an adequate pocket of fluid in the right chest. The area was then prepped and draped in the normal sterile fashion. 1% Lidocaine was used for local anesthesia. Under ultrasound guidance a Safe-T-Centesis catheter was introduced. Thoracentesis was performed. The catheter was removed and a dressing applied. FINDINGS: A total of approximately 0.7 L of serosanguineous fluid was removed. Samples were sent to the laboratory as requested by the clinical team. IMPRESSION: Successful ultrasound guided right thoracentesis yielding 0.7 L of pleural fluid. Postprocedure chest radiograph shows no pneumothorax. Read by: Saverio Danker, PA-C Electronically  Signed   By: Lucrezia Europe M.D.   On: 08/07/2016 13:05    Time Spent in minutes  25   Louellen Molder M.D on 08/11/2016 at 5:53 PM  Between 7am to 7pm - Pager - [phone]  After 7pm go to [email or web address] - password [email or web address]  [facility] -  Office  [phone]

## 2016-08-12 DIAGNOSIS — R1011 Right upper quadrant pain: Secondary | ICD-10-CM

## 2016-08-12 LAB — CULTURE, BLOOD (ROUTINE X 2)
Culture: NO GROWTH
Culture: NO GROWTH
Special Requests: ADEQUATE
Special Requests: ADEQUATE

## 2016-08-12 LAB — CULTURE, BODY FLUID W GRAM STAIN -BOTTLE

## 2016-08-12 LAB — CULTURE, BODY FLUID-BOTTLE: CULTURE: NO GROWTH

## 2016-08-12 MED ORDER — POLYETHYLENE GLYCOL 3350 17 G PO PACK
17.0000 g | PACK | Freq: Every day | ORAL | Status: DC
  Administered 2016-08-12 – 2016-08-14 (×3): 17 g via ORAL
  Filled 2016-08-12 (×4): qty 1

## 2016-08-12 NOTE — Progress Notes (Signed)
PROGRESS NOTE                                                                                                                                                                                                             Patient Demographics:    Jo Collins, is a 66 y.o. female, DOB - Nov 21, 1950, XBW:[id number]  Admit date - 08/06/2016   Admitting Physician Reubin Milan, MD  Outpatient Primary MD for the patient is Deland Pretty, MD  LOS - 5  Outpatient Specialists:NONE  Chief Complaint  Patient presents with  . Shortness of Breath       Brief Narrative   66 year old female with history of GERD presented to the ED with progressive nonproductive cough, shortness of breath and orthopnea. She also complains of pain in her right upper quadrant. She was seen in the ED 1 week back for similar symptoms and was discharged home with doxycycline and albuterol inhaler. CT scan done in the ED showed prevertebral soft tissue mass along the descending thoracic aorta and esophagus measuring 7.74.78 cm. Also showed diffuse abdominal and retroperitoneal lymphadenopathy. Patient underwent thoracentesis of right pleural effusion which was exudative.   Subjective:   Right upper quadrant and back pain better with adjustment of pain medication.   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 Cytology showing reactive mesothelial cells only. 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 Results reviewed with neurosurgeon Dr. Ronnald Ramp  who recommended no surgical intervention given the location of tumor and absence of clinical symptoms.  Right inguinal lymph node was biopsied by IR. Results still pending (as per pathologist results so far have been  inconsistent and running more rest for accurate diagnosis). Cytology results will possibly be back on 8/13. Based on imaging high suspicion for lymphoma.    Right upper quadrant pain Possibly due to right pleural effusion and pleural extension of tumor posteriorly. Improved with Lidoderm patch, Toradol and tramadol.   Abnormal EKG with Q waves in anterior leads. 2-D echo shows LVH with normal LVEF.  GERD Continue PPI  Morbid obesity   Code Status : Full code  Family Communication  : Husband at bedside  Disposition Plan  :  Pending cytology results   Barriers For Discharge : Active symptoms  Consults  :   PC CM IR  Procedures  : Right thoracentesis CT of the chest and abdomen MRI thoracic spine   DVT Prophylaxis  :  Lovenox -  Lab Results  Component Value Date   PLT 318 08/08/2016    Antibiotics  :    Anti-infectives    None        Objective:   Vitals:   08/10/16 1315 08/11/16 0631 08/11/16 2000 08/12/16 0535  BP: (!) 111/48 131/79 (!) 141/70 122/76  Pulse: 70 64 63 63  Resp: 17 18 18 16   Temp: 98.2 F (36.8 C) 97.9 F (36.6 C) 98.3 F (36.8 C) 98.3 F (36.8 C)  TempSrc: Oral  Oral Oral  SpO2: 97% 98% 96% 98%  Weight:      Height:        Wt Readings from Last 3 Encounters:  08/07/16 129.3 kg (285 lb)  07/31/16 129.3 kg (285 lb)     Intake/Output Summary (Last 24 hours) at 08/12/16 1240 Last data filed at 08/12/16 0700  Gross per 24 hour  Intake             1140 ml  Output                0 ml  Net             1140 ml   Physical exam Gen.: Not in distress  HEENT: Moist mucosa, supple neck Chest: Diminished breath sounds over right lung base CVS: Normal S1 and S2, no murmurs GI: Soft, nondistended, nontender, mild right upper quadrant/upper back tenderness Musculoskeletal: Warm, no edema         Data Review:    CBC  Recent Labs Lab 08/06/16 2132 08/08/16 0509  WBC 10.2 9.1  HGB 11.7* 11.3*  HCT 36.5 35.4*  PLT 323  318  MCV 84.1 84.3  MCH 27.0 26.9  MCHC 32.1 31.9  RDW 13.0 13.6  LYMPHSABS 1.6 1.3  MONOABS 0.8 0.5  EOSABS 1.3* 1.0*  BASOSABS 0.0 0.0    Chemistries   Recent Labs Lab 08/06/16 2132 08/07/16 0442 08/08/16 0509 08/09/16 0535  NA 139  --  139 139  K 4.0  --  4.2 3.8  CL 109  --  107 108  CO2 23  --  22 24  GLUCOSE 109*  --  105* 97  BUN 17  --  15 13  CREATININE 1.09*  --  0.92 0.83  CALCIUM 9.1  --  8.7* 8.4*  MG  --   --   --  2.0  AST  --  20  --  17  ALT  --  19  --  15  ALKPHOS  --  51  --  43  BILITOT  --  0.6  --  0.4   ------------------------------------------------------------------------------------------------------------------ No results for input(s): CHOL, HDL, LDLCALC, TRIG, CHOLHDL, LDLDIRECT in the last 72 hours.  No results found for: HGBA1C ------------------------------------------------------------------------------------------------------------------ No results for input(s): TSH, T4TOTAL, T3FREE, THYROIDAB in the last 72 hours.  Invalid input(s): FREET3 ------------------------------------------------------------------------------------------------------------------ No results for input(s): VITAMINB12, FOLATE, FERRITIN, TIBC, IRON, RETICCTPCT in the last 72 hours.  Coagulation profile  Recent Labs Lab 08/07/16 0442  INR 1.09    No results for input(s): DDIMER in the last 72 hours.  Cardiac Enzymes No results for input(s): CKMB, TROPONINI, MYOGLOBIN in the last 168 hours.  Invalid input(s): CK ------------------------------------------------------------------------------------------------------------------  Component Value Date/Time   BNP 39.4 08/07/2016 0023    Inpatient Medications  Scheduled Meds: . heparin  5,000 Units Subcutaneous Q8H  . lidocaine  1 patch Transdermal Q24H  . mouth rinse  15 mL Mouth Rinse BID  . pantoprazole  40 mg Oral Daily  . traMADol  50 mg Oral Q6H   Continuous Infusions: PRN Meds:.ALPRAZolam,  ipratropium-albuterol, ketorolac, oxyCODONE-acetaminophen  Micro Results Recent Results (from the past 240 hour(s))  Culture, blood (Routine X 2) w Reflex to ID Panel     Status: None   Collection Time: 08/07/16 12:25 AM  Result Value Ref Range Status   Specimen Description BLOOD RIGHT ANTECUBITAL  Final   Special Requests   Final    BOTTLES DRAWN AEROBIC AND ANAEROBIC Blood Culture adequate volume   Culture NO GROWTH 5 DAYS  Final   Report Status 08/12/2016 FINAL  Final  Culture, blood (Routine X 2) w Reflex to ID Panel     Status: None   Collection Time: 08/07/16 12:45 AM  Result Value Ref Range Status   Specimen Description BLOOD LEFT ANTECUBITAL  Final   Special Requests   Final    BOTTLES DRAWN AEROBIC ONLY Blood Culture adequate volume   Culture NO GROWTH 5 DAYS  Final   Report Status 08/12/2016 FINAL  Final  Gram stain     Status: None   Collection Time: 08/07/16 12:36 PM  Result Value Ref Range Status   Specimen Description PLEURAL RIGHT  Final   Special Requests NONE  Final   Gram Stain   Final    ABUNDANT WBC PRESENT,BOTH PMN AND MONONUCLEAR NO ORGANISMS SEEN    Report Status 08/07/2016 FINAL  Final  Acid Fast Smear (AFB)     Status: None   Collection Time: 08/07/16  1:02 PM  Result Value Ref Range Status   AFB Specimen Processing Concentration  Final   Acid Fast Smear Negative  Final    Comment: (NOTE) Performed At: [facility] [address] [place], [place] [id number] Lindon Romp MD YS:[id number]    Source (AFB) PLEURAL  Final    Comment: RIGHT  Culture, body fluid-bottle     Status: None   Collection Time: 08/07/16  1:02 PM  Result Value Ref Range Status   Specimen Description PLEURAL RIGHT  Final   Special Requests BOTTLES DRAWN AEROBIC AND ANAEROBIC  Final   Culture NO GROWTH 5 DAYS  Final   Report Status 08/12/2016 FINAL  Final    Radiology Reports Dg Chest 1 View  Result Date: 08/07/2016 CLINICAL DATA:  Status post right  thoracentesis. EXAM: CHEST 1 VIEW COMPARISON:  08/06/2016 chest radiograph. FINDINGS: Stable cardiomediastinal silhouette with normal heart size. No pneumothorax. Small right pleural effusion, decreased. No left pleural effusion. No pulmonary edema. Patchy right lung base opacity is mildly decreased. No new consolidative airspace disease. IMPRESSION: 1. No pneumothorax. 2. Small right pleural effusion, decreased . 3. Nonspecific patchy right lung base opacity, decreased. Electronically Signed   By: Ilona Sorrel M.D.   On: 08/07/2016 13:23   Dg Chest 2 View  Result Date: 08/06/2016 CLINICAL DATA:  Acute onset of shortness of breath and lower back pain. Initial encounter. EXAM: CHEST  2 VIEW COMPARISON:  Chest radiograph performed 07/31/2016 FINDINGS: A small-to-moderate right-sided pleural effusion is noted, with right basilar airspace opacification, concerning for pneumonia. No pneumothorax is seen. The left lung appears clear. The heart is borderline normal in size. No acute osseous abnormalities are identified. IMPRESSION:  Small to moderate right-sided pleural effusion, with right basilar airspace opacification, concerning for pneumonia. Electronically Signed   By: Garald Balding M.D.   On: 08/06/2016 23:15   Dg Chest 2 View  Result Date: 07/31/2016 CLINICAL DATA:  Three days of nonproductive cough associated with shortness of breath and wheezing. Current smoker. EXAM: CHEST  2 VIEW COMPARISON:  Report of a chest x-ray of May 28, 2001. FINDINGS: The left lung is clear. On the right there is volume loss with obscuration of the hemidiaphragm. There is fluid in the minor fissure. There is no mediastinal shift. The heart and pulmonary vascularity are normal. There is mild tortuosity of the ascending thoracic aorta. The bony thorax exhibits no acute abnormality. IMPRESSION: Atelectasis or pneumonia with right pleural E fusion. Followup PA and lateral chest X-ray is recommended in 3-4 weeks following trial of  antibiotic therapy to ensure resolution and exclude underlying malignancy. Electronically Signed   By: David  Martinique M.D.   On: 07/31/2016 16:50   Ct Chest Wo Contrast  Result Date: 08/07/2016 CLINICAL DATA:  Dyspnea and lower back pain EXAM: CT CHEST WITHOUT CONTRAST TECHNIQUE: Multidetector CT imaging of the chest was performed following the standard protocol without IV contrast. COMPARISON:  CXR exams dating back through 07/31/2016 FINDINGS: Cardiovascular: Normal size cardiac chambers without pericardial effusion. Mitral annular calcifications are noted. Minimal coronary arteriosclerosis along the distal LAD. 4.2 cm ascending aortic aneurysm. Dilatation of the main pulmonary artery 3.5 cm consistent with a component of pulmonary hypertension. Mediastinum/Nodes: Prevertebral masslike abnormality along the descending thoracic aorta and esophagus possibly representing enlarged lymph node mass. This is estimated at 7.7 x 4.7 x 8 cm in transverse by AP by craniocaudad dimension. Further assessment is limited due to lack of oral and IV contrast. Lungs/Pleura: Loculated right moderate-sized pleural effusion with compressive atelectasis. Tiny subpleural 4 and 5 mm nodular densities are seen the left upper lobe with pleural-based 4 mm right upper lobe nodular density also noted. These are nonspecific. Upper Abdomen: Retrocrural adenopathy is seen on the left measuring 2.1 cm short axis. Musculoskeletal: Thoracolumbar spondylosis. No acute osseous abnormality. No lytic or blastic disease. IMPRESSION: Confluent prevertebral soft tissue abnormality along the descending thoracic aorta and esophagus. Findings are concerning for an enlarged lymph node mass given retrocrural adenopathy also seen. This confluent masslike abnormality measures 7.7 x 4.7 x 8 cm. Moderate loculated right-sided pleural effusion with compressive atelectasis. Some of the atelectasis could potentially be post obstructive due to the adjacent  mediastinal mass. Bilateral 5 mm or less nodular densities in the upper lobes. No follow-up needed if patient is low-risk (and has no known or suspected primary neoplasm). Non-contrast chest CT can be considered in 12 months if patient is high-risk. This recommendation follows the consensus statement: Guidelines for Management of Incidental Pulmonary Nodules Detected on CT Images: From the Fleischner Society 2017; Radiology 2017; 284:228-243. Electronically Signed   By: Ashley Royalty M.D.   On: 08/07/2016 00:31   Ct Chest W Contrast  Result Date: 08/08/2016 CLINICAL DATA:  66 year old female with mediastinal adenopathy and shortness breath. Post thoracentesis. Cytology pending. Recently quit smoking. Evaluate for metastatic disease. Post abdominal hysterectomy. Subsequent encounter. EXAM: CT CHEST, ABDOMEN, AND PELVIS WITH CONTRAST TECHNIQUE: Multidetector CT imaging of the chest, abdomen and pelvis was performed following the standard protocol during bolus administration of intravenous contrast. CONTRAST:  168mL ISOVUE-300 IOPAMIDOL (ISOVUE-300) INJECTION 61% COMPARISON:  08/07/2016 chest x-ray and chest CT. 05/24/2005 CT of the abdomen and pelvis. FINDINGS: CT  CHEST FINDINGS Cardiovascular: Heart size within normal limits. Prominent mitral valve calcifications. Trace coronary artery calcifications. Prominent ascending thoracic aorta measuring up to 4 cm transverse dimension. Exam not tailored to evaluate for pulmonary embolus. No central pulmonary embolus noted. Main pulmonary artery prominent measuring 3.6 cm which may reflect component of pulmonary hypertension. 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 Question epidural extension of tumor versus engorged epidural veins. Contrast-enhanced MR can be obtained  for further delineation of clinically desired. Lungs/Pleura: Partially loculated right-sided pleural effusion. Pleural extension of tumor posterior inferior right thorax (series 3, images 30 through 36). Confluent atelectasis with air bronchograms right lower lobe right middle lobe. Less confluent atelectasis inferior right upper lobe. No central obstructing mass identified. Tethered pleura with sub pleural thickening greatest lung base. Multiple tiny bilateral nodules throughout all lung zones. Accessory fissure left upper lobe incidentally noted. Trachea and mainstem bronchi are patent. Musculoskeletal: Although thoracic vertebra surrounded by mass, no obvious destruction. Degenerative changes throughout the lower cervical and thoracic spine. CT ABDOMEN PELVIS FINDINGS Hepatobiliary: Significantly enlarged liver spanning over 25.6 cm without focal hepatic lesion noted. No calcified gallstones. Pancreas: No primary pancreatic mass or inflammation. Spleen: No splenic mass or enlargement. Adrenals/Urinary Tract: No hydronephrosis. Left lower pole 1 cm low-density structure statistically likely a cyst. No adrenal mass. Noncontrast filled views of the urinary bladder with limited evaluation secondary to habitus/ artifact. It would be difficult on the present examination to exclude a primary bladder abnormality. Stomach/Bowel: Diverticulosis with regions of muscular hypertrophy. No extraluminal bowel inflammatory process. Limited for evaluating for possibility of bowel/ stomach primary mass secondary to under distension. No obvious abnormality noted. Vascular/Lymphatic: Retroperitoneal, upper abdominal, mesenteric, pelvic and right inguinal adenopathy. Index lymph node right external iliac region measures 5.1 x 3.1 cm. Right inguinal adenopathy which may be assessable to biopsy measures 2.7 x 2 x 2.7 cm. Trace abdominal aortic calcification without aneurysm. No large arterial vessel occlusion. Reproductive: Post  hysterectomy.  No obvious adnexal mass. Other: No free intraperitoneal air. Injection granulomas and subcutaneous gas from injection. Musculoskeletal: No destructive lesion identified. Degenerative changes most prominent L4-5 level with facet degenerative changes, mild anterior slip L4 and bulge contribute to multifactorial spinal stenosis. 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 Question epidural extension of tumor versus engorged epidural veins. Contrast-enhanced MR can be obtained for further delineation of clinically desired. Abdominal region retroperitoneal upper abdominal, mesenteric, pelvic and right inguinal adenopathy. Index lymph node right external iliac region measures 5.1 x 3.1 cm. Right inguinal adenopathy which may be assessable to biopsy measures 2.7 x 2 x 2.7 cm. Partially loculated right-sided pleural effusion. Pleural extension of tumor posterior inferior right thorax (series 3, images 30 through 36). Confluent atelectasis with air bronchograms right lower lobe right middle lobe. Less confluent atelectasis inferior right upper lobe. No central obstructing mass identified. Tethered pleura with sub pleural thickening greatest lung base. Multiple tiny bilateral nodules throughout all lung zones. Findings may reflect tumor such as small cell primary lung neoplasm in this smoker. Adenopathy from lymphoma is a possibility. Adenopathy and spread of tumor to lung from other primary tumor not excluded although other primary lesion not identified (evaluation of bowel limited by under distension  without obvious mass). Significantly enlarged liver spanning over 25.6 cm. Aortic Atherosclerosis (ICD10-I70.0). Prominent ascending thoracic aorta measuring up to 4 cm transverse dimension. Recommend annual imaging  followup by CTA or MRA. This recommendation follows 2010 ACCF/AHA/AATS/ACR/ASA/SCA/SCAI/SIR/STS/SVM Guidelines for the Diagnosis and Management of Patients with Thoracic Aortic Disease. Circulation. 2010; 121: N027-O536 Exam not tailored to evaluate for pulmonary embolus. No central pulmonary embolus noted. Main pulmonary artery prominent measuring 3.6 cm which may reflect component of pulmonary hypertension. Electronically Signed   By: Genia Del M.D.   On: 08/08/2016 07:45   Mr Thoracic Spine W Wo Contrast  Result Date: 08/10/2016 CLINICAL DATA:  66 year old female with mediastinal lymphadenopathy and shortness of breath. Status post thoracentesis 08/07/2016. Prevertebral soft tissue mass by recent chest CT. EXAM: MRI THORACIC WITHOUT AND WITH CONTRAST TECHNIQUE: Multiplanar and multiecho pulse sequences of the thoracic spine were obtained without and with intravenous contrast. CONTRAST:  20 ml MULTIHANCE GADOBENATE DIMEGLUMINE 529 MG/ML IV SOLN COMPARISON:  CT chest 08/09/2015. FINDINGS: MRI THORACIC SPINE FINDINGS Alignment:  Maintained. Vertebrae: No fracture. STIR imaging demonstrates marrow edema in the T8 and T9 vertebral bodies with corresponding decreased T1 signal most consistent with tumor infiltration. More subtle decreased T1 signal is identified in the T7, T10, T11 and likely T12 vertebral bodies worrisome for tumor infiltration. Cord:  Demonstrates normal signal. Paraspinal and other soft tissues: Right pleural effusion is identified. Large prevertebral tumor mass extending along both the right and left sides of multiple vertebral bodies is identified as seen on prior CT scan. There is extension of tumor along the pleura bilaterally. Large right pleural effusion is noted. Disc levels: Multilevel degenerative disease of the cervical spine is seen in the sagittal plane only. Axial imaging is limited but there is epidural tumor extending from approximately T7 through T11 as seen on the sagittal  postcontrast images. Tumor bulk is largest from mid T8 to upper T10 where it is eccentrically prominent to the right. Epidural tumor on the right and anteriorly from the T8-9 to the T9-10 levels compresses and deforms the cord. See axial images 14-21 of series 12. No associated cord signal abnormality is identified. Disc bulging is seen in the upper and mid cervical spine but does not cause central canal stenosis. IMPRESSION: Large paraspinal mass as seen on prior CT scan. Abnormal marrow signal from T7-T11 is most conspicuous in T8 and T9 and consistent with tumor. 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 Epidural tumor is best seen on axial images 14-21 of series 12. Right pleural effusion. Multilevel cervical and thoracic spondylosis. Electronically Signed   By: Inge Rise M.D.   On: 08/10/2016 11:27   Ct Abdomen Pelvis W Contrast  Result Date: 08/08/2016 CLINICAL DATA:  66 year old female with mediastinal adenopathy and shortness breath. Post thoracentesis. Cytology pending. Recently quit smoking. Evaluate for metastatic disease. Post abdominal hysterectomy. Subsequent encounter. EXAM: CT CHEST, ABDOMEN, AND PELVIS WITH CONTRAST TECHNIQUE: Multidetector CT imaging of the chest, abdomen and pelvis was performed following the standard protocol during bolus administration of intravenous contrast. CONTRAST:  147mL ISOVUE-300 IOPAMIDOL (ISOVUE-300) INJECTION 61% COMPARISON:  08/07/2016 chest x-ray and chest CT. 05/24/2005 CT of the abdomen and pelvis. FINDINGS: CT CHEST FINDINGS Cardiovascular: Heart size within normal limits. Prominent mitral valve calcifications. Trace coronary artery calcifications. Prominent ascending thoracic aorta measuring up to 4 cm transverse dimension. Exam not tailored to evaluate for pulmonary embolus. No central pulmonary embolus noted.  Main pulmonary artery prominent measuring 3.6 cm which may reflect  component of pulmonary hypertension. 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 Question epidural extension of tumor versus engorged epidural veins. Contrast-enhanced MR can be obtained for further delineation of clinically desired. Lungs/Pleura: Partially loculated right-sided pleural effusion. Pleural extension of tumor posterior inferior right thorax (series 3, images 30 through 36). Confluent atelectasis with air bronchograms right lower lobe right middle lobe. Less confluent atelectasis inferior right upper lobe. No central obstructing mass identified. Tethered pleura with sub pleural thickening greatest lung base. Multiple tiny bilateral nodules throughout all lung zones. Accessory fissure left upper lobe incidentally noted. Trachea and mainstem bronchi are patent. Musculoskeletal: Although thoracic vertebra surrounded by mass, no obvious destruction. Degenerative changes throughout the lower cervical and thoracic spine. CT ABDOMEN PELVIS FINDINGS Hepatobiliary: Significantly enlarged liver spanning over 25.6 cm without focal hepatic lesion noted. No calcified gallstones. Pancreas: No primary pancreatic mass or inflammation. Spleen: No splenic mass or enlargement. Adrenals/Urinary Tract: No hydronephrosis. Left lower pole 1 cm low-density structure statistically likely a cyst. No adrenal mass. Noncontrast filled views of the urinary bladder with limited evaluation secondary to habitus/ artifact. It would be difficult on the present examination to exclude a primary bladder abnormality. Stomach/Bowel: Diverticulosis with regions of muscular hypertrophy. No extraluminal bowel inflammatory process. Limited for evaluating for possibility of bowel/ stomach primary mass secondary to under distension. No obvious  abnormality noted. Vascular/Lymphatic: Retroperitoneal, upper abdominal, mesenteric, pelvic and right inguinal adenopathy. Index lymph node right external iliac region measures 5.1 x 3.1 cm. Right inguinal adenopathy which may be assessable to biopsy measures 2.7 x 2 x 2.7 cm. Trace abdominal aortic calcification without aneurysm. No large arterial vessel occlusion. Reproductive: Post hysterectomy.  No obvious adnexal mass. Other: No free intraperitoneal air. Injection granulomas and subcutaneous gas from injection. Musculoskeletal: No destructive lesion identified. Degenerative changes most prominent L4-5 level with facet degenerative changes, mild anterior slip L4 and bulge contribute to multifactorial spinal stenosis. 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 Question epidural extension of tumor versus engorged epidural veins. Contrast-enhanced MR can be obtained for further delineation of clinically desired. Abdominal region retroperitoneal upper abdominal, mesenteric, pelvic and right inguinal adenopathy. Index lymph node right external iliac region measures 5.1 x 3.1 cm. Right inguinal adenopathy which may be assessable to biopsy measures 2.7 x 2 x 2.7 cm. Partially loculated right-sided pleural effusion. Pleural extension of tumor posterior inferior right thorax (series 3, images 30 through 36). Confluent atelectasis with air bronchograms right lower lobe right middle lobe. Less confluent atelectasis inferior right upper lobe. No central obstructing mass identified. Tethered pleura with sub pleural thickening greatest lung base. Multiple tiny bilateral nodules throughout all lung zones. Findings may reflect tumor such as small cell primary lung neoplasm in this smoker. Adenopathy from lymphoma is a possibility.  Adenopathy and spread of tumor to lung from other primary tumor not excluded although other primary lesion not identified (evaluation of bowel limited by under distension without obvious mass). Significantly enlarged liver spanning over 25.6 cm. Aortic Atherosclerosis (ICD10-I70.0). Prominent ascending thoracic aorta measuring up to 4 cm transverse dimension. Recommend annual imaging followup by CTA or MRA. This recommendation follows 2010 ACCF/AHA/AATS/ACR/ASA/SCA/SCAI/SIR/STS/SVM Guidelines for  the Diagnosis and Management of Patients with Thoracic Aortic Disease. Circulation. 2010; 121: [id number] Exam not tailored to evaluate for pulmonary embolus. No central pulmonary embolus noted. Main pulmonary artery prominent measuring 3.6 cm which may reflect component of pulmonary hypertension. Electronically Signed   By: Genia Del M.D.   On: 08/08/2016 07:45   US Biopsy  Result Date: 08/09/2016 INDICATION: 66 year old female with newly diagnosed multifocal lymphadenopathy highly concerning for malignant lymphoma. EXAM: Ultrasound-guided core biopsy right inguinal lymph node MEDICATIONS: None. ANESTHESIA/SEDATION: Moderate (conscious) sedation was employed during this procedure. A total of Versed 1.5 mg and Fentanyl 75 mcg was administered intravenously. Moderate Sedation Time: 13 minutes. The patient's level of consciousness and vital signs were monitored continuously by radiology nursing throughout the procedure under my direct supervision. FLUOROSCOPY TIME:  Fluoroscopy Time: 0 minutes 0 seconds (0 mGy). COMPLICATIONS: None immediate. PROCEDURE: Informed written consent was obtained from the patient after a thorough discussion of the procedural risks, benefits and alternatives. All questions were addressed. A timeout was performed prior to the initiation of the procedure. The right inguinal region was interrogated with ultrasound. A hypoechoic lymph node with internal vascularity measures 2.7 x 1.4 cm. A  suitable skin entry site was selected and marked. Following sterile prep and drape in the standard fashion with chlorhexidine skin prep, local anesthesia was attained by infiltration with 1% lidocaine. A small dermatotomy was made. Using real-time sonographic guidance, multiple 16 gauge core biopsies were obtained using the Bard automated biopsy device. Biopsy specimens were placed in saline and delivered to pathology for further analysis. Post biopsy ultrasound imaging demonstrates no evidence of active bleeding or complication. IMPRESSION: Technically successful core biopsy of abnormal right inguinal lymph node. Signed, Criselda Peaches, MD [facility] Electronically Signed   By: Jacqulynn Cadet M.D.   On: 08/09/2016 13:12   Dg Chest Port 1 View  Result Date: 08/08/2016 CLINICAL DATA:  Respiratory failure EXAM: PORTABLE CHEST 1 VIEW COMPARISON:  08/07/2016 FINDINGS: Right pleural effusion and right lower lobe airspace disease unchanged. Left lung remains clear. Heart size normal. Negative for heart failure. IMPRESSION: Right lower lobe airspace disease and right pleural effusion unchanged. Electronically Signed   By: Franchot Gallo M.D.   On: 08/08/2016 07:46   Ir Thoracentesis Asp Pleural Space W/img Guide  Result Date: 08/07/2016 INDICATION: Patient admitted with cough, dyspnea, orthopnea. Imaging reveals a a prevertebral soft tissue abnormality consistent with a mass as well as bilateral nodular densities in the upper lobes of the lungs. A moderate right pleural effusion is noted as well. Request has been made for diagnostic and therapeutic thoracentesis. EXAM: ULTRASOUND GUIDED DIAGNOSTIC AND THERAPEUTIC THORACENTESIS MEDICATIONS: 1% xylocaine COMPLICATIONS: None immediate. PROCEDURE: An ultrasound guided thoracentesis was thoroughly discussed with the patient and questions answered. The benefits, risks, alternatives and complications were  also discussed. The patient understands and wishes to proceed with the procedure. Written consent was obtained. Ultrasound was performed to localize and mark an adequate pocket of fluid in the right chest. The area was then prepped and draped in the normal sterile fashion. 1% Lidocaine was used for local anesthesia. Under ultrasound guidance a Safe-T-Centesis catheter was introduced. Thoracentesis was performed. The catheter was removed and a dressing applied. FINDINGS: A total of approximately 0.7 L of serosanguineous fluid was removed. Samples were sent to the laboratory as requested by the clinical team. IMPRESSION: Successful ultrasound guided right thoracentesis yielding 0.7 L of pleural fluid. Postprocedure chest radiograph shows no pneumothorax.  Read by: Saverio Danker, PA-C Electronically Signed   By: Lucrezia Europe M.D.   On: 08/07/2016 13:05    Time Spent in minutes  25   Louellen Molder M.D on 08/12/2016 at 12:40 PM  Between 7am to 7pm - Pager - [phone]  After 7pm go to [email or web address] - password [email or web address]  [facility] -  Office  [phone]

## 2016-08-13 ENCOUNTER — Inpatient Hospital Stay (HOSPITAL_COMMUNITY): Payer: Medicare Other

## 2016-08-13 LAB — COMPREHENSIVE METABOLIC PANEL
ALT: 23 U/L (ref 14–54)
ANION GAP: 5 (ref 5–15)
AST: 26 U/L (ref 15–41)
Albumin: 2.7 g/dL — ABNORMAL LOW (ref 3.5–5.0)
Alkaline Phosphatase: 48 U/L (ref 38–126)
BILIRUBIN TOTAL: 0.5 mg/dL (ref 0.3–1.2)
BUN: 11 mg/dL (ref 6–20)
CALCIUM: 8.6 mg/dL — AB (ref 8.9–10.3)
CO2: 28 mmol/L (ref 22–32)
Chloride: 104 mmol/L (ref 101–111)
Creatinine, Ser: 0.86 mg/dL (ref 0.44–1.00)
GFR calc non Af Amer: >60 mL/min (ref >60)
Glucose, Bld: 98 mg/dL (ref 65–99)
POTASSIUM: 4.1 mmol/L (ref 3.5–5.1)
Sodium: 137 mmol/L (ref 135–145)
TOTAL PROTEIN: 6.3 g/dL — AB (ref 6.5–8.1)

## 2016-08-13 LAB — CBC
HEMATOCRIT: 33.3 % — AB (ref 36.0–46.0)
Hemoglobin: 10.6 g/dL — ABNORMAL LOW (ref 12.0–15.0)
MCH: 26.8 pg (ref 26.0–34.0)
MCHC: 31.8 g/dL (ref 30.0–36.0)
MCV: 84.3 fL (ref 78.0–100.0)
Platelets: 255 10*3/uL (ref 150–400)
RBC: 3.95 MIL/uL (ref 3.87–5.11)
RDW: 13.5 % (ref 11.5–15.5)
WBC: 6.6 10*3/uL (ref 4.0–10.5)

## 2016-08-13 LAB — IRON AND TIBC
IRON: 36 ug/dL (ref 28–170)
SATURATION RATIOS: 16 % (ref 10.4–31.8)
TIBC: 227 ug/dL — ABNORMAL LOW (ref 250–450)
UIBC: 191 ug/dL

## 2016-08-13 LAB — VITAMIN B12: Vitamin B-12: 249 pg/mL (ref 180–914)

## 2016-08-13 MED ORDER — TRAMADOL HCL 50 MG PO TABS
100.0000 mg | ORAL_TABLET | Freq: Four times a day (QID) | ORAL | Status: DC
  Administered 2016-08-13 – 2016-08-14 (×6): 100 mg via ORAL
  Filled 2016-08-13 (×7): qty 2

## 2016-08-13 NOTE — Progress Notes (Signed)
PROGRESS NOTE                                                                                                                                                                                                             Patient Demographics:    Jo Collins, is a 66 y.o. female, DOB - 1950-12-10, YIF:[id number]  Admit date - 08/06/2016   Admitting Physician Reubin Milan, MD  Outpatient Primary MD for the patient is Deland Pretty, MD  LOS - 6  Outpatient Specialists:NONE  Chief Complaint  Patient presents with  . Shortness of Breath       Brief Narrative   66 year old female with history of GERD presented to the ED with progressive nonproductive cough, shortness of breath and orthopnea. She also complains of pain in her right upper quadrant. She was seen in the ED 1 week back for similar symptoms and was discharged home with doxycycline and albuterol inhaler. CT scan done in the ED showed prevertebral soft tissue mass along the descending thoracic aorta and esophagus measuring 7.74.78 cm. Also showed diffuse abdominal and retroperitoneal lymphadenopathy. Patient underwent thoracentesis of right pleural effusion which was exudative.   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 Cytology showing reactive mesothelial cells only. 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 Results reviewed with neurosurgeon Dr. Ronnald Ramp  who recommended no surgical intervention given the location of tumor and absence of clinical symptoms.  Right inguinal lymph node was biopsied by IR. Results still pending (as per pathologist results so  far have been inconsistent and running more rest for accurate diagnosis). Cytology results will possibly be back on 8/13. Based on imaging high suspicion for lymphoma.      Right upper quadrant pain Possibly due to right pleural effusion and pleural extension of tumor posteriorly. Increase dose of tramadol. Continue Lidoderm patch and Toradol. Check two-view chest x-ray to evaluate right pleural effusion.   Abnormal EKG with Q waves in anterior leads. 2-D echo shows LVH with normal LVEF.  GERD Continue PPI  Morbid obesity  Code Status : Full code  Family Communication  : None at bedside  Disposition Plan  : Pending cytology results   Barriers For Discharge : Active symptoms  Consults  :   PC CM IR  Procedures  : Right thoracentesis CT of the chest and abdomen MRI thoracic spine   DVT Prophylaxis  :  Lovenox -  Lab Results  Component Value Date   PLT 255 08/13/2016    Antibiotics  :    Anti-infectives    None        Objective:   Vitals:   08/11/16 2000 08/12/16 0535 08/12/16 1427 08/12/16 2125  BP: (!) 141/70 122/76 129/78 121/66  Pulse: 63 63 62 63  Resp: 18 16 16 17   Temp: 98.3 F (36.8 C) 98.3 F (36.8 C) 97.9 F (36.6 C) 98.6 F (37 C)  TempSrc: Oral Oral Oral Oral  SpO2: 96% 98% 97% 97%  Weight:      Height:        Wt Readings from Last 3 Encounters:  08/07/16 129.3 kg (285 lb)  07/31/16 129.3 kg (285 lb)     Intake/Output Summary (Last 24 hours) at 08/13/16 1340 Last data filed at 08/13/16 0932  Gross per 24 hour  Intake              620 ml  Output                0 ml  Net              620 ml   Physical exam Gen.: Obese female not in distress HEENT: Moist mucosa, supple neck Chest: Diminished right lung best sounds CVS: Normal S1 and S2, no murmurs GI: Soft, nondistended, mild right upper quadrant/back tenderness Musculoskeletal: Warm, no edema          Data Review:    CBC  Recent Labs Lab 08/06/16 2132  08/08/16 0509 08/13/16 0528  WBC 10.2 9.1 6.6  HGB 11.7* 11.3* 10.6*  HCT 36.5 35.4* 33.3*  PLT 323 318 255  MCV 84.1 84.3 84.3  MCH 27.0 26.9 26.8  MCHC 32.1 31.9 31.8  RDW 13.0 13.6 13.5  LYMPHSABS 1.6 1.3  --   MONOABS 0.8 0.5  --   EOSABS 1.3* 1.0*  --   BASOSABS 0.0 0.0  --     Chemistries   Recent Labs Lab 08/06/16 2132 08/07/16 0442 08/08/16 0509 08/09/16 0535 08/13/16 0528  NA 139  --  139 139 137  K 4.0  --  4.2 3.8 4.1  CL 109  --  107 108 104  CO2 23  --  22 24 28   GLUCOSE 109*  --  105* 97 98  BUN 17  --  15 13 11   CREATININE 1.09*  --  0.92 0.83 0.86  CALCIUM 9.1  --  8.7* 8.4* 8.6*  MG  --   --   --  2.0  --   AST  --  20  --  17 26  ALT  --  19  --  15 23  ALKPHOS  --  51  --  43 48  BILITOT  --  0.6  --  0.4 0.5   ------------------------------------------------------------------------------------------------------------------ No results for input(s): CHOL, HDL, LDLCALC, TRIG, CHOLHDL, LDLDIRECT in the last 72 hours.  No results found for: HGBA1C ------------------------------------------------------------------------------------------------------------------ No results for input(s): TSH, T4TOTAL, T3FREE, THYROIDAB in the last 72 hours.  Invalid input(s): FREET3 ------------------------------------------------------------------------------------------------------------------  Recent Labs  08/13/16 [phone]  VITAMINB12 249  TIBC 227*  IRON 36    Coagulation profile  Recent Labs Lab 08/07/16 0442  INR 1.09    No results for input(s): DDIMER in the last 72 hours.  Cardiac Enzymes No results for input(s): CKMB, TROPONINI, MYOGLOBIN in the last 168 hours.  Invalid input(s): CK ------------------------------------------------------------------------------------------------------------------    Component Value Date/Time   BNP 39.4 08/07/2016 0023    Inpatient Medications  Scheduled Meds: . heparin  5,000 Units Subcutaneous Q8H  .  lidocaine  1 patch Transdermal Q24H  . mouth rinse  15 mL Mouth Rinse BID  . pantoprazole  40 mg Oral Daily  . polyethylene glycol  17 g Oral Daily  . traMADol  100 mg Oral Q6H   Continuous Infusions: PRN Meds:.ALPRAZolam, ipratropium-albuterol, ketorolac, oxyCODONE-acetaminophen  Micro Results Recent Results (from the past 240 hour(s))  Culture, blood (Routine X 2) w Reflex to ID Panel     Status: None   Collection Time: 08/07/16 12:25 AM  Result Value Ref Range Status   Specimen Description BLOOD RIGHT ANTECUBITAL  Final   Special Requests   Final    BOTTLES DRAWN AEROBIC AND ANAEROBIC Blood Culture adequate volume   Culture NO GROWTH 5 DAYS  Final   Report Status 08/12/2016 FINAL  Final  Culture, blood (Routine X 2) w Reflex to ID Panel     Status: None   Collection Time: 08/07/16 12:45 AM  Result Value Ref Range Status   Specimen Description BLOOD LEFT ANTECUBITAL  Final   Special Requests   Final    BOTTLES DRAWN AEROBIC ONLY Blood Culture adequate volume   Culture NO GROWTH 5 DAYS  Final   Report Status 08/12/2016 FINAL  Final  Gram stain     Status: None   Collection Time: 08/07/16 12:36 PM  Result Value Ref Range Status   Specimen Description PLEURAL RIGHT  Final   Special Requests NONE  Final   Gram Stain   Final    ABUNDANT WBC PRESENT,BOTH PMN AND MONONUCLEAR NO ORGANISMS SEEN    Report Status 08/07/2016 FINAL  Final  Acid Fast Smear (AFB)     Status: None   Collection Time: 08/07/16  1:02 PM  Result Value Ref Range Status   AFB Specimen Processing Concentration  Final   Acid Fast Smear Negative  Final    Comment: (NOTE) Performed At: [facility] [address] [place], [place] [id number] Lindon Romp MD WU:[id number]    Source (AFB) PLEURAL  Final    Comment: RIGHT  Culture, body fluid-bottle     Status: None   Collection Time: 08/07/16  1:02 PM  Result Value Ref Range Status   Specimen Description PLEURAL RIGHT  Final   Special Requests  BOTTLES DRAWN AEROBIC AND ANAEROBIC  Final   Culture NO GROWTH 5 DAYS  Final   Report Status 08/12/2016 FINAL  Final    Radiology Reports Dg Chest 1 View  Result Date: 08/07/2016 CLINICAL DATA:  Status post right thoracentesis. EXAM: CHEST 1 VIEW COMPARISON:  08/06/2016 chest radiograph. FINDINGS: Stable cardiomediastinal silhouette with normal heart size. No pneumothorax. Small right pleural effusion, decreased. No left pleural effusion. No pulmonary edema. Patchy right lung base opacity is mildly decreased. No new consolidative airspace disease. IMPRESSION: 1. No pneumothorax. 2. Small right pleural effusion, decreased . 3. Nonspecific patchy right lung base opacity, decreased. Electronically Signed   By: Ilona Sorrel M.D.   On: 08/07/2016 13:23   Dg Chest 2 View  Result  Date: 08/06/2016 CLINICAL DATA:  Acute onset of shortness of breath and lower back pain. Initial encounter. EXAM: CHEST  2 VIEW COMPARISON:  Chest radiograph performed 07/31/2016 FINDINGS: A small-to-moderate right-sided pleural effusion is noted, with right basilar airspace opacification, concerning for pneumonia. No pneumothorax is seen. The left lung appears clear. The heart is borderline normal in size. No acute osseous abnormalities are identified. IMPRESSION: Small to moderate right-sided pleural effusion, with right basilar airspace opacification, concerning for pneumonia. Electronically Signed   By: Garald Balding M.D.   On: 08/06/2016 23:15   Dg Chest 2 View  Result Date: 07/31/2016 CLINICAL DATA:  Three days of nonproductive cough associated with shortness of breath and wheezing. Current smoker. EXAM: CHEST  2 VIEW COMPARISON:  Report of a chest x-ray of May 28, 2001. FINDINGS: The left lung is clear. On the right there is volume loss with obscuration of the hemidiaphragm. There is fluid in the minor fissure. There is no mediastinal shift. The heart and pulmonary vascularity are normal. There is mild tortuosity of the  ascending thoracic aorta. The bony thorax exhibits no acute abnormality. IMPRESSION: Atelectasis or pneumonia with right pleural E fusion. Followup PA and lateral chest X-ray is recommended in 3-4 weeks following trial of antibiotic therapy to ensure resolution and exclude underlying malignancy. Electronically Signed   By: David  Martinique M.D.   On: 07/31/2016 16:50   Ct Chest Wo Contrast  Result Date: 08/07/2016 CLINICAL DATA:  Dyspnea and lower back pain EXAM: CT CHEST WITHOUT CONTRAST TECHNIQUE: Multidetector CT imaging of the chest was performed following the standard protocol without IV contrast. COMPARISON:  CXR exams dating back through 07/31/2016 FINDINGS: Cardiovascular: Normal size cardiac chambers without pericardial effusion. Mitral annular calcifications are noted. Minimal coronary arteriosclerosis along the distal LAD. 4.2 cm ascending aortic aneurysm. Dilatation of the main pulmonary artery 3.5 cm consistent with a component of pulmonary hypertension. Mediastinum/Nodes: Prevertebral masslike abnormality along the descending thoracic aorta and esophagus possibly representing enlarged lymph node mass. This is estimated at 7.7 x 4.7 x 8 cm in transverse by AP by craniocaudad dimension. Further assessment is limited due to lack of oral and IV contrast. Lungs/Pleura: Loculated right moderate-sized pleural effusion with compressive atelectasis. Tiny subpleural 4 and 5 mm nodular densities are seen the left upper lobe with pleural-based 4 mm right upper lobe nodular density also noted. These are nonspecific. Upper Abdomen: Retrocrural adenopathy is seen on the left measuring 2.1 cm short axis. Musculoskeletal: Thoracolumbar spondylosis. No acute osseous abnormality. No lytic or blastic disease. IMPRESSION: Confluent prevertebral soft tissue abnormality along the descending thoracic aorta and esophagus. Findings are concerning for an enlarged lymph node mass given retrocrural adenopathy also seen. This  confluent masslike abnormality measures 7.7 x 4.7 x 8 cm. Moderate loculated right-sided pleural effusion with compressive atelectasis. Some of the atelectasis could potentially be post obstructive due to the adjacent mediastinal mass. Bilateral 5 mm or less nodular densities in the upper lobes. No follow-up needed if patient is low-risk (and has no known or suspected primary neoplasm). Non-contrast chest CT can be considered in 12 months if patient is high-risk. This recommendation follows the consensus statement: Guidelines for Management of Incidental Pulmonary Nodules Detected on CT Images: From the Fleischner Society 2017; Radiology 2017; 284:228-243. Electronically Signed   By: Ashley Royalty M.D.   On: 08/07/2016 00:31   Ct Chest W Contrast  Result Date: 08/08/2016 CLINICAL DATA:  66 year old female with mediastinal adenopathy and shortness breath. Post thoracentesis. Cytology pending.  Recently quit smoking. Evaluate for metastatic disease. Post abdominal hysterectomy. Subsequent encounter. EXAM: CT CHEST, ABDOMEN, AND PELVIS WITH CONTRAST TECHNIQUE: Multidetector CT imaging of the chest, abdomen and pelvis was performed following the standard protocol during bolus administration of intravenous contrast. CONTRAST:  143mL ISOVUE-300 IOPAMIDOL (ISOVUE-300) INJECTION 61% COMPARISON:  08/07/2016 chest x-ray and chest CT. 05/24/2005 CT of the abdomen and pelvis. FINDINGS: CT CHEST FINDINGS Cardiovascular: Heart size within normal limits. Prominent mitral valve calcifications. Trace coronary artery calcifications. Prominent ascending thoracic aorta measuring up to 4 cm transverse dimension. Exam not tailored to evaluate for pulmonary embolus. No central pulmonary embolus noted. Main pulmonary artery prominent measuring 3.6 cm which may reflect component of pulmonary hypertension. Mediastinum/Nodes: Paravertebral mass envelops vertebra and descending thoracic aorta (displacing and compressing the esophagus  anteriorly) suspicious for confluent adenopathy with maximal transverse dimension of 8.8 x 8.6 cm spanning from T4 -T12. Question epidural extension of tumor versus engorged epidural veins. Contrast-enhanced MR can be obtained for further delineation of clinically desired. Lungs/Pleura: Partially loculated right-sided pleural effusion. Pleural extension of tumor posterior inferior right thorax (series 3, images 30 through 36). Confluent atelectasis with air bronchograms right lower lobe right middle lobe. Less confluent atelectasis inferior right upper lobe. No central obstructing mass identified. Tethered pleura with sub pleural thickening greatest lung base. Multiple tiny bilateral nodules throughout all lung zones. Accessory fissure left upper lobe incidentally noted. Trachea and mainstem bronchi are patent. Musculoskeletal: Although thoracic vertebra surrounded by mass, no obvious destruction. Degenerative changes throughout the lower cervical and thoracic spine. CT ABDOMEN PELVIS FINDINGS Hepatobiliary: Significantly enlarged liver spanning over 25.6 cm without focal hepatic lesion noted. No calcified gallstones. Pancreas: No primary pancreatic mass or inflammation. Spleen: No splenic mass or enlargement. Adrenals/Urinary Tract: No hydronephrosis. Left lower pole 1 cm low-density structure statistically likely a cyst. No adrenal mass. Noncontrast filled views of the urinary bladder with limited evaluation secondary to habitus/ artifact. It would be difficult on the present examination to exclude a primary bladder abnormality. Stomach/Bowel: Diverticulosis with regions of muscular hypertrophy. No extraluminal bowel inflammatory process. Limited for evaluating for possibility of bowel/ stomach primary mass secondary to under distension. No obvious abnormality noted. Vascular/Lymphatic: Retroperitoneal, upper abdominal, mesenteric, pelvic and right inguinal adenopathy. Index lymph node right external iliac region  measures 5.1 x 3.1 cm. Right inguinal adenopathy which may be assessable to biopsy measures 2.7 x 2 x 2.7 cm. Trace abdominal aortic calcification without aneurysm. No large arterial vessel occlusion. Reproductive: Post hysterectomy.  No obvious adnexal mass. Other: No free intraperitoneal air. Injection granulomas and subcutaneous gas from injection. Musculoskeletal: No destructive lesion identified. Degenerative changes most prominent L4-5 level with facet degenerative changes, mild anterior slip L4 and bulge contribute to multifactorial spinal stenosis. 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 Question epidural extension of tumor versus engorged epidural veins. Contrast-enhanced MR can be obtained for further delineation of clinically desired. Abdominal region retroperitoneal upper abdominal, mesenteric, pelvic and right inguinal adenopathy. Index lymph node right external iliac region measures 5.1 x 3.1 cm. Right inguinal adenopathy which may be assessable to biopsy measures 2.7 x 2 x 2.7 cm. Partially loculated right-sided pleural effusion. Pleural extension of tumor posterior inferior right thorax (series 3, images 30 through 36). Confluent atelectasis with air bronchograms right lower lobe right middle lobe. Less confluent atelectasis inferior right upper lobe. No  central obstructing mass identified. Tethered pleura with sub pleural thickening greatest lung base. Multiple tiny bilateral nodules throughout all lung zones. Findings may reflect tumor such as small cell primary lung neoplasm in this smoker. Adenopathy from lymphoma is a possibility. Adenopathy and spread of tumor to lung from other primary tumor not excluded although other primary lesion not identified (evaluation of bowel limited by under  distension without obvious mass). Significantly enlarged liver spanning over 25.6 cm. Aortic Atherosclerosis (ICD10-I70.0). Prominent ascending thoracic aorta measuring up to 4 cm transverse dimension. Recommend annual imaging followup by CTA or MRA. This recommendation follows 2010 ACCF/AHA/AATS/ACR/ASA/SCA/SCAI/SIR/STS/SVM Guidelines for the Diagnosis and Management of Patients with Thoracic Aortic Disease. Circulation. 2010; 121: U440-H474 Exam not tailored to evaluate for pulmonary embolus. No central pulmonary embolus noted. Main pulmonary artery prominent measuring 3.6 cm which may reflect component of pulmonary hypertension. Electronically Signed   By: Genia Del M.D.   On: 08/08/2016 07:45   Mr Thoracic Spine W Wo Contrast  Result Date: 08/10/2016 CLINICAL DATA:  66 year old female with mediastinal lymphadenopathy and shortness of breath. Status post thoracentesis 08/07/2016. Prevertebral soft tissue mass by recent chest CT. EXAM: MRI THORACIC WITHOUT AND WITH CONTRAST TECHNIQUE: Multiplanar and multiecho pulse sequences of the thoracic spine were obtained without and with intravenous contrast. CONTRAST:  20 ml MULTIHANCE GADOBENATE DIMEGLUMINE 529 MG/ML IV SOLN COMPARISON:  CT chest 08/09/2015. FINDINGS: MRI THORACIC SPINE FINDINGS Alignment:  Maintained. Vertebrae: No fracture. STIR imaging demonstrates marrow edema in the T8 and T9 vertebral bodies with corresponding decreased T1 signal most consistent with tumor infiltration. More subtle decreased T1 signal is identified in the T7, T10, T11 and likely T12 vertebral bodies worrisome for tumor infiltration. Cord:  Demonstrates normal signal. Paraspinal and other soft tissues: Right pleural effusion is identified. Large prevertebral tumor mass extending along both the right and left sides of multiple vertebral bodies is identified as seen on prior CT scan. There is extension of tumor along the pleura bilaterally. Large right pleural effusion is noted.  Disc levels: Multilevel degenerative disease of the cervical spine is seen in the sagittal plane only. Axial imaging is limited but there is epidural tumor extending from approximately T7 through T11 as seen on the sagittal postcontrast images. Tumor bulk is largest from mid T8 to upper T10 where it is eccentrically prominent to the right. Epidural tumor on the right and anteriorly from the T8-9 to the T9-10 levels compresses and deforms the cord. See axial images 14-21 of series 12. No associated cord signal abnormality is identified. Disc bulging is seen in the upper and mid cervical spine but does not cause central canal stenosis. IMPRESSION: Large paraspinal mass as seen on prior CT scan. Abnormal marrow signal from T7-T11 is most conspicuous in T8 and T9 and consistent with tumor. 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 Epidural tumor is best seen on axial images 14-21 of series 12. Right pleural effusion. Multilevel cervical and thoracic spondylosis. Electronically Signed   By: Inge Rise M.D.   On: 08/10/2016 11:27   Ct Abdomen Pelvis W Contrast  Result Date: 08/08/2016 CLINICAL DATA:  66 year old female with mediastinal adenopathy and shortness breath. Post thoracentesis. Cytology pending. Recently quit smoking. Evaluate for metastatic disease. Post abdominal hysterectomy. Subsequent encounter. EXAM: CT CHEST, ABDOMEN, AND PELVIS WITH CONTRAST TECHNIQUE: Multidetector CT imaging of the chest, abdomen and pelvis was performed following the standard protocol during bolus administration of  intravenous contrast. CONTRAST:  180mL ISOVUE-300 IOPAMIDOL (ISOVUE-300) INJECTION 61% COMPARISON:  08/07/2016 chest x-ray and chest CT. 05/24/2005 CT of the abdomen and pelvis. FINDINGS: CT CHEST FINDINGS Cardiovascular: Heart size within normal limits. Prominent mitral valve calcifications. Trace coronary artery calcifications. Prominent  ascending thoracic aorta measuring up to 4 cm transverse dimension. Exam not tailored to evaluate for pulmonary embolus. No central pulmonary embolus noted. Main pulmonary artery prominent measuring 3.6 cm which may reflect component of pulmonary hypertension. 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 Question epidural extension of tumor versus engorged epidural veins. Contrast-enhanced MR can be obtained for further delineation of clinically desired. Lungs/Pleura: Partially loculated right-sided pleural effusion. Pleural extension of tumor posterior inferior right thorax (series 3, images 30 through 36). Confluent atelectasis with air bronchograms right lower lobe right middle lobe. Less confluent atelectasis inferior right upper lobe. No central obstructing mass identified. Tethered pleura with sub pleural thickening greatest lung base. Multiple tiny bilateral nodules throughout all lung zones. Accessory fissure left upper lobe incidentally noted. Trachea and mainstem bronchi are patent. Musculoskeletal: Although thoracic vertebra surrounded by mass, no obvious destruction. Degenerative changes throughout the lower cervical and thoracic spine. CT ABDOMEN PELVIS FINDINGS Hepatobiliary: Significantly enlarged liver spanning over 25.6 cm without focal hepatic lesion noted. No calcified gallstones. Pancreas: No primary pancreatic mass or inflammation. Spleen: No splenic mass or enlargement. Adrenals/Urinary Tract: No hydronephrosis. Left lower pole 1 cm low-density structure statistically likely a cyst. No adrenal mass. Noncontrast filled views of the urinary bladder with limited evaluation secondary to habitus/ artifact. It would be difficult on the present examination to exclude a primary bladder abnormality.  Stomach/Bowel: Diverticulosis with regions of muscular hypertrophy. No extraluminal bowel inflammatory process. Limited for evaluating for possibility of bowel/ stomach primary mass secondary to under distension. No obvious abnormality noted. Vascular/Lymphatic: Retroperitoneal, upper abdominal, mesenteric, pelvic and right inguinal adenopathy. Index lymph node right external iliac region measures 5.1 x 3.1 cm. Right inguinal adenopathy which may be assessable to biopsy measures 2.7 x 2 x 2.7 cm. Trace abdominal aortic calcification without aneurysm. No large arterial vessel occlusion. Reproductive: Post hysterectomy.  No obvious adnexal mass. Other: No free intraperitoneal air. Injection granulomas and subcutaneous gas from injection. Musculoskeletal: No destructive lesion identified. Degenerative changes most prominent L4-5 level with facet degenerative changes, mild anterior slip L4 and bulge contribute to multifactorial spinal stenosis. 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 Question epidural extension of tumor versus engorged epidural veins. Contrast-enhanced MR can be obtained for further delineation of clinically desired. Abdominal region retroperitoneal upper abdominal, mesenteric, pelvic and right inguinal adenopathy. Index lymph node right external iliac region measures 5.1 x 3.1 cm. Right inguinal adenopathy which may be assessable to biopsy measures 2.7 x 2 x 2.7 cm. Partially loculated right-sided pleural effusion. Pleural extension of tumor posterior inferior right thorax (series 3, images 30 through 36). Confluent atelectasis with air bronchograms right lower lobe right middle lobe. Less confluent atelectasis inferior right upper lobe. No central obstructing mass identified. Tethered pleura with sub  pleural thickening greatest lung base. Multiple tiny bilateral nodules throughout all lung zones. Findings may reflect tumor such as small cell primary lung neoplasm in this smoker. Adenopathy from lymphoma is  a possibility. Adenopathy and spread of tumor to lung from other primary tumor not excluded although other primary lesion not identified (evaluation of bowel limited by under distension without obvious mass). Significantly enlarged liver spanning over 25.6 cm. Aortic Atherosclerosis (ICD10-I70.0). Prominent ascending thoracic aorta measuring up to 4 cm transverse dimension. Recommend annual imaging followup by CTA or MRA. This recommendation follows 2010 ACCF/AHA/AATS/ACR/ASA/SCA/SCAI/SIR/STS/SVM Guidelines for the Diagnosis and Management of Patients with Thoracic Aortic Disease. Circulation. 2010; 121: [id number] Exam not tailored to evaluate for pulmonary embolus. No central pulmonary embolus noted. Main pulmonary artery prominent measuring 3.6 cm which may reflect component of pulmonary hypertension. Electronically Signed   By: Genia Del M.D.   On: 08/08/2016 07:45   US Biopsy  Result Date: 08/09/2016 INDICATION: 66 year old female with newly diagnosed multifocal lymphadenopathy highly concerning for malignant lymphoma. EXAM: Ultrasound-guided core biopsy right inguinal lymph node MEDICATIONS: None. ANESTHESIA/SEDATION: Moderate (conscious) sedation was employed during this procedure. A total of Versed 1.5 mg and Fentanyl 75 mcg was administered intravenously. Moderate Sedation Time: 13 minutes. The patient's level of consciousness and vital signs were monitored continuously by radiology nursing throughout the procedure under my direct supervision. FLUOROSCOPY TIME:  Fluoroscopy Time: 0 minutes 0 seconds (0 mGy). COMPLICATIONS: None immediate. PROCEDURE: Informed written consent was obtained from the patient after a thorough discussion of the procedural risks, benefits and alternatives. All questions  were addressed. A timeout was performed prior to the initiation of the procedure. The right inguinal region was interrogated with ultrasound. A hypoechoic lymph node with internal vascularity measures 2.7 x 1.4 cm. A suitable skin entry site was selected and marked. Following sterile prep and drape in the standard fashion with chlorhexidine skin prep, local anesthesia was attained by infiltration with 1% lidocaine. A small dermatotomy was made. Using real-time sonographic guidance, multiple 16 gauge core biopsies were obtained using the Bard automated biopsy device. Biopsy specimens were placed in saline and delivered to pathology for further analysis. Post biopsy ultrasound imaging demonstrates no evidence of active bleeding or complication. IMPRESSION: Technically successful core biopsy of abnormal right inguinal lymph node. Signed, Criselda Peaches, MD [facility] Electronically Signed   By: Jacqulynn Cadet M.D.   On: 08/09/2016 13:12   Dg Chest Port 1 View  Result Date: 08/08/2016 CLINICAL DATA:  Respiratory failure EXAM: PORTABLE CHEST 1 VIEW COMPARISON:  08/07/2016 FINDINGS: Right pleural effusion and right lower lobe airspace disease unchanged. Left lung remains clear. Heart size normal. Negative for heart failure. IMPRESSION: Right lower lobe airspace disease and right pleural effusion unchanged. Electronically Signed   By: Franchot Gallo M.D.   On: 08/08/2016 07:46   Ir Thoracentesis Asp Pleural Space W/img Guide  Result Date: 08/07/2016 INDICATION: Patient admitted with cough, dyspnea, orthopnea. Imaging reveals a a prevertebral soft tissue abnormality consistent with a mass as well as bilateral nodular densities in the upper lobes of the lungs. A moderate right pleural effusion is noted as well. Request has been made for diagnostic and therapeutic thoracentesis. EXAM: ULTRASOUND GUIDED DIAGNOSTIC AND THERAPEUTIC THORACENTESIS  MEDICATIONS: 1% xylocaine COMPLICATIONS: None immediate. PROCEDURE: An ultrasound guided thoracentesis was thoroughly discussed with the patient and questions answered. The benefits, risks, alternatives and complications were also discussed. The patient understands and wishes to proceed with the procedure. Written consent was obtained. Ultrasound was performed to localize and mark an adequate pocket of fluid in the right chest. The area was then prepped and draped in the normal sterile fashion. 1%  Lidocaine was used for local anesthesia. Under ultrasound guidance a Safe-T-Centesis catheter was introduced. Thoracentesis was performed. The catheter was removed and a dressing applied. FINDINGS: A total of approximately 0.7 L of serosanguineous fluid was removed. Samples were sent to the laboratory as requested by the clinical team. IMPRESSION: Successful ultrasound guided right thoracentesis yielding 0.7 L of pleural fluid. Postprocedure chest radiograph shows no pneumothorax. Read by: Saverio Danker, PA-C Electronically Signed   By: Lucrezia Europe M.D.   On: 08/07/2016 13:05    Time Spent in minutes  25   Louellen Molder M.D on 08/13/2016 at 1:40 PM  Between 7am to 7pm - Pager - [phone]  After 7pm go to [email or web address] - password [facility]  [facility] -  Office  [phone]

## 2016-08-14 ENCOUNTER — Encounter (HOSPITAL_COMMUNITY): Payer: Self-pay | Admitting: Hematology

## 2016-08-14 ENCOUNTER — Inpatient Hospital Stay (HOSPITAL_COMMUNITY): Payer: Medicare Other

## 2016-08-14 ENCOUNTER — Other Ambulatory Visit: Payer: Self-pay | Admitting: Hematology

## 2016-08-14 DIAGNOSIS — C8338 Diffuse large B-cell lymphoma, lymph nodes of multiple sites: Principal | ICD-10-CM

## 2016-08-14 DIAGNOSIS — C8590 Non-Hodgkin lymphoma, unspecified, unspecified site: Secondary | ICD-10-CM | POA: Diagnosis present

## 2016-08-14 DIAGNOSIS — M546 Pain in thoracic spine: Secondary | ICD-10-CM

## 2016-08-14 DIAGNOSIS — C8359 Lymphoblastic (diffuse) lymphoma, extranodal and solid organ sites: Secondary | ICD-10-CM

## 2016-08-14 HISTORY — PX: IR THORACENTESIS ASP PLEURAL SPACE W/IMG GUIDE: IMG5380

## 2016-08-14 MED ORDER — OXYCODONE HCL ER 10 MG PO T12A
10.0000 mg | EXTENDED_RELEASE_TABLET | Freq: Two times a day (BID) | ORAL | Status: DC
  Administered 2016-08-14 – 2016-08-15 (×2): 10 mg via ORAL
  Filled 2016-08-14 (×2): qty 1

## 2016-08-14 MED ORDER — DEXAMETHASONE 4 MG PO TABS
8.0000 mg | ORAL_TABLET | Freq: Every day | ORAL | Status: DC
  Administered 2016-08-14 – 2016-08-15 (×2): 8 mg via ORAL
  Filled 2016-08-14 (×2): qty 2

## 2016-08-14 MED ORDER — OXYCODONE-ACETAMINOPHEN 5-325 MG PO TABS: 1.0000 | ORAL_TABLET | ORAL | Status: DC | PRN

## 2016-08-14 MED ORDER — LIDOCAINE HCL (PF) 1 % IJ SOLN
INTRAMUSCULAR | Status: AC
  Filled 2016-08-14: qty 30

## 2016-08-14 MED ORDER — VITAMIN B-12 1000 MCG PO TABS
500.0000 ug | ORAL_TABLET | Freq: Every day | ORAL | Status: DC
  Administered 2016-08-14 – 2016-08-15 (×2): 500 ug via ORAL
  Filled 2016-08-14 (×2): qty 1

## 2016-08-14 NOTE — Consult Note (Signed)
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  HISTORY OF PRESENTING ILLNESS:   Jo Collins is a wonderful 66 y.o. female who has been referred to [place] by Dr Louellen Molder, MD  for evaluation and management of newly diagnosed B-cell lymphoma.  Patient has a history of GERD, morbid obesity, smoker who recently quit and suspected sleep apnea and was in the emergency room on 07/31/2016 with increasing shortness of breath and was sent to the emergency room from the urgent care for concerns of pneumonia. Chest x-ray was done which showed atelectasis versus pneumonia with right-sided pleural effusion. She was discharged home on oral doxycycline.  She presented to the emergency room again with dyspnea and back pain. She had a CT scan of the chest without contrast on 08/07/2016 which showed Confluent prevertebral soft tissue abnormality along the descending thoracic aorta and esophagus. Findings are concerning for an enlarged lymph node mass given retrocrural adenopathy also seen. This confluent masslike abnormality measures 7.7 x 4.7 x 8 cm. Moderate loculated right-sided pleural effusion with compressive atelectasis. Some of the atelectasis could potentially be post obstructive due to the adjacent mediastinal mass.  Patient had a CT chest abdomen pelvis with contrast on 08/08/2016 which showed Paravertebral mass envelops vertebra and descending thoracic aorta (displacing and compressing the esophagus anteriorly) suspicious for confluent adenopathy with maximal transverse dimension of 8.8 x 8.6 cm spanning from T4 -T12. Question epidural extension of tumor versus engorged epidural veins. Contrast-enhanced MR can be obtained for further delineation of clinically desired.  Abdominal region  retroperitoneal upper abdominal, mesenteric, pelvic and right inguinal adenopathy. Index lymph node right external iliac region measures 5.1 x 3.1 cm. Right inguinal adenopathy which may be assessable to biopsy measures 2.7 x 2 x 2.7 cm.  Partially loculated right-sided pleural effusion. Pleural extension of tumor posterior inferior right thorax (series 3, images 30 through 36). Significantly enlarged liver spanning over 25.6 cm.  MRI of the thoracic spine with and without contrast done on 08/10/2016 show -Large paraspinal mass as seen on prior CT scan. Abnormal marrow signal from T7-T11 is most conspicuous in T8 and T9 and consistent with tumor. 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 Epidural tumor is best seen on axial images 14-21 of series 12.  Patient subsequently had an ultrasound-guided biopsy of her right inguinal lymph node on 08/09/2016. 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  She had had a therapeutic and diagnostic thoracentesis on 08/07/2016 and needed repeat one today again on 08/14/2016. Cytology previously showed reactive mesothelial cells but the differential was predominantly lymphocytic.  We were consulted for further evaluation and management. She notes decreased appetite for the last 1 month Increasing back pain for the last 2 weeks Increasing shortness of breath for the last 2 months   MEDICAL HISTORY:  Past Medical History:  Diagnosis Date  . GERD (gastroesophageal reflux disease)   Morbid Obesity .Body mass index is 52.98 kg/m. Suspected Sleep Apnea  SURGICAL HISTORY: Past Surgical History:  Procedure Laterality Date  . ABDOMINAL HYSTERECTOMY    . IR THORACENTESIS ASP PLEURAL SPACE W/IMG GUIDE  08/07/2016    SOCIAL HISTORY: Social History   Social History  . Marital  status: Married    Spouse name: N/A  .  Number of children: N/A  . Years of education: N/A   Occupational History  . Not on file.   Social History Main Topics  . Smoking status: Former Smoker    Packs/day: 0.50    Years: 47.00    Types: Cigarettes    Quit date: 07/28/2016  . Smokeless tobacco: Never Used  . Alcohol use No  . Drug use: Unknown  . Sexual activity: Not on file   Other Topics Concern  . Not on file   Social History Narrative  . No narrative on file    FAMILY HISTORY: Family History  Problem Relation Age of Onset  . COPD Mother   . Diabetes Mellitus II Sister   . Diabetes Mellitus II Brother   . Diabetes Mellitus II Sister   . Stroke Sister   . Hypertension Sister   . Diabetes Mellitus II Sister   Patient notes her sister was diagnosed with breast cancer  ALLERGIES:  has No Known Allergies.  MEDICATIONS:  Current Facility-Administered Medications  Medication Dose Route Frequency Provider Last Rate Last Dose  . ALPRAZolam Duanne Moron) tablet 0.25 mg  0.25 mg Oral TID PRN Reubin Milan, MD   0.25 mg at 08/10/16 0905  . dexamethasone (DECADRON) tablet 8 mg  8 mg Oral Daily Brunetta Genera, MD      . heparin injection 5,000 Units  5,000 Units Subcutaneous Q8H Brynda Greathouse [place], [place]   5,000 Units at 08/14/16 1358  . ipratropium-albuterol (DUONEB) 0.5-2.5 (3) MG/3ML nebulizer solution 3 mL  3 mL Nebulization Q4H PRN Reubin Milan, MD   3 mL at 08/14/16 1205  . lidocaine (LIDODERM) 5 % 1 patch  1 patch Transdermal Q24H Dhungel, Nishant, MD   1 patch at 08/14/16 1155  . lidocaine (PF) (XYLOCAINE) 1 % injection           . MEDLINE mouth rinse  15 mL Mouth Rinse BID Charlynne Cousins, MD   15 mL at 08/14/16 0944  . oxyCODONE (OXYCONTIN) 12 hr tablet 10 mg  10 mg Oral Q12H Dhungel, Nishant, MD      . oxyCODONE-acetaminophen (PERCOCET/ROXICET) 5-325 MG per tablet 1-2 tablet  1-2 tablet Oral Q4H PRN Dhungel, Nishant, MD      . pantoprazole (PROTONIX) EC tablet 40 mg  40 mg Oral Daily  Reubin Milan, MD   40 mg at 08/14/16 0944  . polyethylene glycol (MIRALAX / GLYCOLAX) packet 17 g  17 g Oral Daily Dhungel, Nishant, MD   17 g at 08/14/16 0944  . traMADol (ULTRAM) tablet 100 mg  100 mg Oral Q6H Dhungel, Nishant, MD   100 mg at 08/14/16 0619  . vitamin B-12 (CYANOCOBALAMIN) tablet 500 mcg  500 mcg Oral Daily Dhungel, Nishant, MD   500 mcg at 08/14/16 1155    REVIEW OF SYSTEMS:    10 Point review of Systems was done is negative except as noted above.  PHYSICAL EXAMINATION: ECOG PERFORMANCE STATUS: 1 - Symptomatic but completely ambulatory  . Vitals:   08/14/16 0548 08/14/16 1205  BP: (!) 154/76   Pulse: 63   Resp: 18   Temp: 98.1 F (36.7 C)   SpO2: 97% 94%   Filed Weights   08/06/16 2120 08/07/16 0449  Weight: 285 lb (129.3 kg) 285 lb (129.3 kg)   .Body mass index is 52.98 kg/m.  GENERAL:alert, in no acute distress and comfortable SKIN: no acute rashes, no significant  lesions EYES: conjunctiva are pink and non-injected, sclera anicteric OROPHARYNX: MMM, no exudates, no oropharyngeal erythema or ulceration NECK: supple, no JVD LYMPH:  no palpable lymphadenopathy in the cervical, axillary or inguinal regions LUNGS: clear to auscultation b/l with normal respiratory effort HEART: regular rate & rhythm ABDOMEN:  normoactive bowel sounds , non tender, not distended. Extremity: no pedal edema PSYCH: alert & oriented x 3 with fluent speech NEURO: no focal motor/sensory deficits  LABORATORY DATA:  I have reviewed the data as listed  . CBC Latest Ref Rng & Units 08/13/2016 08/08/2016 08/06/2016  WBC 4.0 - 10.5 K/uL 6.6 9.1 10.2  Hemoglobin 12.0 - 15.0 g/dL 10.6(L) 11.3(L) 11.7(L)  Hematocrit 36.0 - 46.0 % 33.3(L) 35.4(L) 36.5  Platelets 150 - 400 K/uL 255 318 323    . CMP Latest Ref Rng & Units 08/13/2016 08/09/2016 08/08/2016  Glucose 65 - 99 mg/dL 98 97 105(H)  BUN 6 - 20 mg/dL 11 13 15   Creatinine 0.44 - 1.00 mg/dL 0.86 0.83 0.92  Sodium 135 - 145  mmol/L 137 139 139  Potassium 3.5 - 5.1 mmol/L 4.1 3.8 4.2  Chloride 101 - 111 mmol/L 104 108 107  CO2 22 - 32 mmol/L 28 24 22   Calcium 8.9 - 10.3 mg/dL 8.6(L) 8.4(L) 8.7(L)  Total Protein 6.5 - 8.1 g/dL 6.3(L) 5.9(L) -  Total Bilirubin 0.3 - 1.2 mg/dL 0.5 0.4 -  Alkaline Phos 38 - 126 U/L 48 43 -  AST 15 - 41 U/L 26 17 -  ALT 14 - 54 U/L 23 15 -      RADIOGRAPHIC STUDIES: I have personally reviewed the radiological images as listed and agreed with the findings in the report. Dg Chest 1 View  Result Date: 08/14/2016 CLINICAL DATA:  Status post right-sided thoracentesis EXAM: CHEST 1 VIEW COMPARISON:  Chest x-ray dated August 13, 2016 FINDINGS: The left lung is adequately inflated. On the right there is improved aeration at the lung base since thoracentesis. The volume of pleural fluid has decreased somewhat. No postprocedure pneumothorax is observed. The cardiac silhouette remains enlarged. The central pulmonary vascularity is prominent but may be accentuated by patient positioning. IMPRESSION: No postprocedure pneumothorax following right-sided thoracentesis. Some improved aeration of the right lung is seen. Electronically Signed   By: David  Martinique M.D.   On: 08/14/2016 11:20   Dg Chest 1 View  Result Date: 08/07/2016 CLINICAL DATA:  Status post right thoracentesis. EXAM: CHEST 1 VIEW COMPARISON:  08/06/2016 chest radiograph. FINDINGS: Stable cardiomediastinal silhouette with normal heart size. No pneumothorax. Small right pleural effusion, decreased. No left pleural effusion. No pulmonary edema. Patchy right lung base opacity is mildly decreased. No new consolidative airspace disease. IMPRESSION: 1. No pneumothorax. 2. Small right pleural effusion, decreased . 3. Nonspecific patchy right lung base opacity, decreased. Electronically Signed   By: Ilona Sorrel M.D.   On: 08/07/2016 13:23   Dg Chest 2 View  Result Date: 08/13/2016 CLINICAL DATA:  Right-sided pleural effusion. EXAM: CHEST  2  VIEW COMPARISON:  MRI of the thoracic spine 08/10/2016. Chest x-ray and CT 08/08/16. FINDINGS: Heart size normal. A right paraspinal mass is again noted. A right pleural effusion is increasing. No significant consolidation is present on the left. IMPRESSION: 1. Increasing right-sided pleural effusion and airspace opacity. 2. Right paraspinal mass. Electronically Signed   By: San Morelle M.D.   On: 08/13/2016 14:35   Dg Chest 2 View  Result Date: 08/06/2016 CLINICAL DATA:  Acute onset of shortness of breath  and lower back pain. Initial encounter. EXAM: CHEST  2 VIEW COMPARISON:  Chest radiograph performed 07/31/2016 FINDINGS: A small-to-moderate right-sided pleural effusion is noted, with right basilar airspace opacification, concerning for pneumonia. No pneumothorax is seen. The left lung appears clear. The heart is borderline normal in size. No acute osseous abnormalities are identified. IMPRESSION: Small to moderate right-sided pleural effusion, with right basilar airspace opacification, concerning for pneumonia. Electronically Signed   By: Garald Balding M.D.   On: 08/06/2016 23:15   Dg Chest 2 View  Result Date: 07/31/2016 CLINICAL DATA:  Three days of nonproductive cough associated with shortness of breath and wheezing. Current smoker. EXAM: CHEST  2 VIEW COMPARISON:  Report of a chest x-ray of May 28, 2001. FINDINGS: The left lung is clear. On the right there is volume loss with obscuration of the hemidiaphragm. There is fluid in the minor fissure. There is no mediastinal shift. The heart and pulmonary vascularity are normal. There is mild tortuosity of the ascending thoracic aorta. The bony thorax exhibits no acute abnormality. IMPRESSION: Atelectasis or pneumonia with right pleural E fusion. Followup PA and lateral chest X-ray is recommended in 3-4 weeks following trial of antibiotic therapy to ensure resolution and exclude underlying malignancy. Electronically Signed   By: David  Martinique M.D.    On: 07/31/2016 16:50   Ct Chest Wo Contrast  Result Date: 08/07/2016 CLINICAL DATA:  Dyspnea and lower back pain EXAM: CT CHEST WITHOUT CONTRAST TECHNIQUE: Multidetector CT imaging of the chest was performed following the standard protocol without IV contrast. COMPARISON:  CXR exams dating back through 07/31/2016 FINDINGS: Cardiovascular: Normal size cardiac chambers without pericardial effusion. Mitral annular calcifications are noted. Minimal coronary arteriosclerosis along the distal LAD. 4.2 cm ascending aortic aneurysm. Dilatation of the main pulmonary artery 3.5 cm consistent with a component of pulmonary hypertension. Mediastinum/Nodes: Prevertebral masslike abnormality along the descending thoracic aorta and esophagus possibly representing enlarged lymph node mass. This is estimated at 7.7 x 4.7 x 8 cm in transverse by AP by craniocaudad dimension. Further assessment is limited due to lack of oral and IV contrast. Lungs/Pleura: Loculated right moderate-sized pleural effusion with compressive atelectasis. Tiny subpleural 4 and 5 mm nodular densities are seen the left upper lobe with pleural-based 4 mm right upper lobe nodular density also noted. These are nonspecific. Upper Abdomen: Retrocrural adenopathy is seen on the left measuring 2.1 cm short axis. Musculoskeletal: Thoracolumbar spondylosis. No acute osseous abnormality. No lytic or blastic disease. IMPRESSION: Confluent prevertebral soft tissue abnormality along the descending thoracic aorta and esophagus. Findings are concerning for an enlarged lymph node mass given retrocrural adenopathy also seen. This confluent masslike abnormality measures 7.7 x 4.7 x 8 cm. Moderate loculated right-sided pleural effusion with compressive atelectasis. Some of the atelectasis could potentially be post obstructive due to the adjacent mediastinal mass. Bilateral 5 mm or less nodular densities in the upper lobes. No follow-up needed if patient is low-risk (and has no  known or suspected primary neoplasm). Non-contrast chest CT can be considered in 12 months if patient is high-risk. This recommendation follows the consensus statement: Guidelines for Management of Incidental Pulmonary Nodules Detected on CT Images: From the Fleischner Society 2017; Radiology 2017; 284:228-243. Electronically Signed   By: Ashley Royalty M.D.   On: 08/07/2016 00:31   Ct Chest W Contrast  Result Date: 08/08/2016 CLINICAL DATA:  66 year old female with mediastinal adenopathy and shortness breath. Post thoracentesis. Cytology pending. Recently quit smoking. Evaluate for metastatic disease. Post abdominal hysterectomy. Subsequent  encounter. EXAM: CT CHEST, ABDOMEN, AND PELVIS WITH CONTRAST TECHNIQUE: Multidetector CT imaging of the chest, abdomen and pelvis was performed following the standard protocol during bolus administration of intravenous contrast. CONTRAST:  166mL ISOVUE-300 IOPAMIDOL (ISOVUE-300) INJECTION 61% COMPARISON:  08/07/2016 chest x-ray and chest CT. 05/24/2005 CT of the abdomen and pelvis. FINDINGS: CT CHEST FINDINGS Cardiovascular: Heart size within normal limits. Prominent mitral valve calcifications. Trace coronary artery calcifications. Prominent ascending thoracic aorta measuring up to 4 cm transverse dimension. Exam not tailored to evaluate for pulmonary embolus. No central pulmonary embolus noted. Main pulmonary artery prominent measuring 3.6 cm which may reflect component of pulmonary hypertension. 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 Question epidural extension of tumor versus engorged epidural veins. Contrast-enhanced MR can be obtained for further delineation of clinically desired. Lungs/Pleura: Partially loculated right-sided pleural effusion. Pleural extension of  tumor posterior inferior right thorax (series 3, images 30 through 36). Confluent atelectasis with air bronchograms right lower lobe right middle lobe. Less confluent atelectasis inferior right upper lobe. No central obstructing mass identified. Tethered pleura with sub pleural thickening greatest lung base. Multiple tiny bilateral nodules throughout all lung zones. Accessory fissure left upper lobe incidentally noted. Trachea and mainstem bronchi are patent. Musculoskeletal: Although thoracic vertebra surrounded by mass, no obvious destruction. Degenerative changes throughout the lower cervical and thoracic spine. CT ABDOMEN PELVIS FINDINGS Hepatobiliary: Significantly enlarged liver spanning over 25.6 cm without focal hepatic lesion noted. No calcified gallstones. Pancreas: No primary pancreatic mass or inflammation. Spleen: No splenic mass or enlargement. Adrenals/Urinary Tract: No hydronephrosis. Left lower pole 1 cm low-density structure statistically likely a cyst. No adrenal mass. Noncontrast filled views of the urinary bladder with limited evaluation secondary to habitus/ artifact. It would be difficult on the present examination to exclude a primary bladder abnormality. Stomach/Bowel: Diverticulosis with regions of muscular hypertrophy. No extraluminal bowel inflammatory process. Limited for evaluating for possibility of bowel/ stomach primary mass secondary to under distension. No obvious abnormality noted. Vascular/Lymphatic: Retroperitoneal, upper abdominal, mesenteric, pelvic and right inguinal adenopathy. Index lymph node right external iliac region measures 5.1 x 3.1 cm. Right inguinal adenopathy which may be assessable to biopsy measures 2.7 x 2 x 2.7 cm. Trace abdominal aortic calcification without aneurysm. No large arterial vessel occlusion. Reproductive: Post hysterectomy.  No obvious adnexal mass. Other: No free intraperitoneal air. Injection granulomas and subcutaneous gas from injection.  Musculoskeletal: No destructive lesion identified. Degenerative changes most prominent L4-5 level with facet degenerative changes, mild anterior slip L4 and bulge contribute to multifactorial spinal stenosis. 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 Question epidural extension of tumor versus engorged epidural veins. Contrast-enhanced MR can be obtained for further delineation of clinically desired. Abdominal region retroperitoneal upper abdominal, mesenteric, pelvic and right inguinal adenopathy. Index lymph node right external iliac region measures 5.1 x 3.1 cm. Right inguinal adenopathy which may be assessable to biopsy measures 2.7 x 2 x 2.7 cm. Partially loculated right-sided pleural effusion. Pleural extension of tumor posterior inferior right thorax (series 3, images 30 through 36). Confluent atelectasis with air bronchograms right lower lobe right middle lobe. Less confluent atelectasis inferior right upper lobe. No central obstructing mass identified. Tethered pleura with sub pleural thickening greatest  lung base. Multiple tiny bilateral nodules throughout all lung zones. Findings may reflect tumor such as small cell primary lung neoplasm in this smoker. Adenopathy from lymphoma is a possibility. Adenopathy and spread of tumor to lung from other primary tumor not excluded although other primary lesion not identified (evaluation of bowel limited by under distension without obvious mass). Significantly enlarged liver spanning over 25.6 cm. Aortic Atherosclerosis (ICD10-I70.0). Prominent ascending thoracic aorta measuring up to 4 cm transverse dimension. Recommend annual imaging followup by CTA or MRA. This recommendation follows 2010 ACCF/AHA/AATS/ACR/ASA/SCA/SCAI/SIR/STS/SVM Guidelines for the Diagnosis and  Management of Patients with Thoracic Aortic Disease. Circulation. 2010; 121: C585-I778 Exam not tailored to evaluate for pulmonary embolus. No central pulmonary embolus noted. Main pulmonary artery prominent measuring 3.6 cm which may reflect component of pulmonary hypertension. Electronically Signed   By: Genia Del M.D.   On: 08/08/2016 07:45   Mr Thoracic Spine W Wo Contrast  Result Date: 08/10/2016 CLINICAL DATA:  66 year old female with mediastinal lymphadenopathy and shortness of breath. Status post thoracentesis 08/07/2016. Prevertebral soft tissue mass by recent chest CT. EXAM: MRI THORACIC WITHOUT AND WITH CONTRAST TECHNIQUE: Multiplanar and multiecho pulse sequences of the thoracic spine were obtained without and with intravenous contrast. CONTRAST:  20 ml MULTIHANCE GADOBENATE DIMEGLUMINE 529 MG/ML IV SOLN COMPARISON:  CT chest 08/09/2015. FINDINGS: MRI THORACIC SPINE FINDINGS Alignment:  Maintained. Vertebrae: No fracture. STIR imaging demonstrates marrow edema in the T8 and T9 vertebral bodies with corresponding decreased T1 signal most consistent with tumor infiltration. More subtle decreased T1 signal is identified in the T7, T10, T11 and likely T12 vertebral bodies worrisome for tumor infiltration. Cord:  Demonstrates normal signal. Paraspinal and other soft tissues: Right pleural effusion is identified. Large prevertebral tumor mass extending along both the right and left sides of multiple vertebral bodies is identified as seen on prior CT scan. There is extension of tumor along the pleura bilaterally. Large right pleural effusion is noted. Disc levels: Multilevel degenerative disease of the cervical spine is seen in the sagittal plane only. Axial imaging is limited but there is epidural tumor extending from approximately T7 through T11 as seen on the sagittal postcontrast images. Tumor bulk is largest from mid T8 to upper T10 where it is eccentrically prominent to the right. Epidural tumor on  the right and anteriorly from the T8-9 to the T9-10 levels compresses and deforms the cord. See axial images 14-21 of series 12. No associated cord signal abnormality is identified. Disc bulging is seen in the upper and mid cervical spine but does not cause central canal stenosis. IMPRESSION: Large paraspinal mass as seen on prior CT scan. Abnormal marrow signal from T7-T11 is most conspicuous in T8 and T9 and consistent with tumor. 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 Epidural tumor is best seen on axial images 14-21 of series 12. Right pleural effusion. Multilevel cervical and thoracic spondylosis. Electronically Signed   By: Inge Rise M.D.   On: 08/10/2016 11:27   Ct Abdomen Pelvis W Contrast  Result Date: 08/08/2016 CLINICAL DATA:  66 year old female with mediastinal adenopathy and shortness breath. Post thoracentesis. Cytology pending. Recently quit smoking. Evaluate for metastatic disease. Post abdominal hysterectomy. Subsequent encounter. EXAM: CT CHEST, ABDOMEN, AND PELVIS WITH CONTRAST TECHNIQUE: Multidetector CT imaging of the chest, abdomen and pelvis was performed following the standard protocol during bolus administration of intravenous contrast. CONTRAST:  159mL ISOVUE-300 IOPAMIDOL (ISOVUE-300) INJECTION 61% COMPARISON:  08/07/2016 chest x-ray and chest CT. 05/24/2005 CT of the abdomen and pelvis. FINDINGS: CT CHEST FINDINGS Cardiovascular: Heart size within normal limits. Prominent mitral valve calcifications. Trace coronary artery calcifications. Prominent ascending thoracic aorta measuring up to 4 cm transverse dimension. Exam not tailored to evaluate for pulmonary embolus. No central pulmonary embolus noted. Main pulmonary artery prominent measuring 3.6 cm which may reflect component of pulmonary hypertension. Mediastinum/Nodes: Paravertebral mass envelops vertebra and descending thoracic aorta (displacing and  compressing the esophagus anteriorly) suspicious for confluent adenopathy with maximal transverse dimension of 8.8 x 8.6 cm spanning from T4 -T12. Question epidural extension of tumor versus engorged epidural veins. Contrast-enhanced MR can be obtained for further delineation of clinically desired. Lungs/Pleura: Partially loculated right-sided pleural effusion. Pleural extension of tumor posterior inferior right thorax (series 3, images 30 through 36). Confluent atelectasis with air bronchograms right lower lobe right middle lobe. Less confluent atelectasis inferior right upper lobe. No central obstructing mass identified. Tethered pleura with sub pleural thickening greatest lung base. Multiple tiny bilateral nodules throughout all lung zones. Accessory fissure left upper lobe incidentally noted. Trachea and mainstem bronchi are patent. Musculoskeletal: Although thoracic vertebra surrounded by mass, no obvious destruction. Degenerative changes throughout the lower cervical and thoracic spine. CT ABDOMEN PELVIS FINDINGS Hepatobiliary: Significantly enlarged liver spanning over 25.6 cm without focal hepatic lesion noted. No calcified gallstones. Pancreas: No primary pancreatic mass or inflammation. Spleen: No splenic mass or enlargement. Adrenals/Urinary Tract: No hydronephrosis. Left lower pole 1 cm low-density structure statistically likely a cyst. No adrenal mass. Noncontrast filled views of the urinary bladder with limited evaluation secondary to habitus/ artifact. It would be difficult on the present examination to exclude a primary bladder abnormality. Stomach/Bowel: Diverticulosis with regions of muscular hypertrophy. No extraluminal bowel inflammatory process. Limited for evaluating for possibility of bowel/ stomach primary mass secondary to under distension. No obvious abnormality noted. Vascular/Lymphatic: Retroperitoneal, upper abdominal, mesenteric, pelvic and right inguinal adenopathy. Index lymph node  right external iliac region measures 5.1 x 3.1 cm. Right inguinal adenopathy which may be assessable to biopsy measures 2.7 x 2 x 2.7 cm. Trace abdominal aortic calcification without aneurysm. No large arterial vessel occlusion. Reproductive: Post hysterectomy.  No obvious adnexal mass. Other: No free intraperitoneal air. Injection granulomas and subcutaneous gas from injection. Musculoskeletal: No destructive lesion identified. Degenerative changes most prominent L4-5 level with facet degenerative changes, mild anterior slip L4 and bulge contribute to multifactorial spinal stenosis. 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 Question epidural extension of tumor versus engorged epidural veins. Contrast-enhanced MR can be obtained for further delineation of clinically desired. Abdominal region retroperitoneal upper abdominal, mesenteric, pelvic and right inguinal adenopathy. Index lymph node right external iliac region measures 5.1 x 3.1 cm. Right inguinal adenopathy which may be assessable to biopsy measures 2.7 x 2 x 2.7 cm. Partially loculated right-sided pleural effusion. Pleural extension of tumor posterior inferior right thorax (series 3, images 30 through 36). Confluent atelectasis with air bronchograms right lower lobe right middle lobe. Less confluent atelectasis inferior right upper lobe. No central obstructing mass identified. Tethered pleura with sub pleural thickening greatest lung base. Multiple tiny bilateral nodules throughout all lung zones. Findings may reflect tumor such as small cell primary lung neoplasm in this smoker. Adenopathy from lymphoma is a possibility. Adenopathy and spread of tumor to lung from other primary tumor  not excluded although other primary lesion not identified (evaluation of  bowel limited by under distension without obvious mass). Significantly enlarged liver spanning over 25.6 cm. Aortic Atherosclerosis (ICD10-I70.0). Prominent ascending thoracic aorta measuring up to 4 cm transverse dimension. Recommend annual imaging followup by CTA or MRA. This recommendation follows 2010 ACCF/AHA/AATS/ACR/ASA/SCA/SCAI/SIR/STS/SVM Guidelines for the Diagnosis and Management of Patients with Thoracic Aortic Disease. Circulation. 2010; 121: G401-U272 Exam not tailored to evaluate for pulmonary embolus. No central pulmonary embolus noted. Main pulmonary artery prominent measuring 3.6 cm which may reflect component of pulmonary hypertension. Electronically Signed   By: Genia Del M.D.   On: 08/08/2016 07:45   US Biopsy  Result Date: 08/09/2016 INDICATION: 66 year old female with newly diagnosed multifocal lymphadenopathy highly concerning for malignant lymphoma. EXAM: Ultrasound-guided core biopsy right inguinal lymph node MEDICATIONS: None. ANESTHESIA/SEDATION: Moderate (conscious) sedation was employed during this procedure. A total of Versed 1.5 mg and Fentanyl 75 mcg was administered intravenously. Moderate Sedation Time: 13 minutes. The patient's level of consciousness and vital signs were monitored continuously by radiology nursing throughout the procedure under my direct supervision. FLUOROSCOPY TIME:  Fluoroscopy Time: 0 minutes 0 seconds (0 mGy). COMPLICATIONS: None immediate. PROCEDURE: Informed written consent was obtained from the patient after a thorough discussion of the procedural risks, benefits and alternatives. All questions were addressed. A timeout was performed prior to the initiation of the procedure. The right inguinal region was interrogated with ultrasound. A hypoechoic lymph node with internal vascularity measures 2.7 x 1.4 cm. A suitable skin entry site was selected and marked. Following sterile prep and drape in the standard fashion with chlorhexidine skin prep, local  anesthesia was attained by infiltration with 1% lidocaine. A small dermatotomy was made. Using real-time sonographic guidance, multiple 16 gauge core biopsies were obtained using the Bard automated biopsy device. Biopsy specimens were placed in saline and delivered to pathology for further analysis. Post biopsy ultrasound imaging demonstrates no evidence of active bleeding or complication. IMPRESSION: Technically successful core biopsy of abnormal right inguinal lymph node. Signed, Criselda Peaches, MD [facility] Electronically Signed   By: Jacqulynn Cadet M.D.   On: 08/09/2016 13:12   Dg Chest Port 1 View  Result Date: 08/08/2016 CLINICAL DATA:  Respiratory failure EXAM: PORTABLE CHEST 1 VIEW COMPARISON:  08/07/2016 FINDINGS: Right pleural effusion and right lower lobe airspace disease unchanged. Left lung remains clear. Heart size normal. Negative for heart failure. IMPRESSION: Right lower lobe airspace disease and right pleural effusion unchanged. Electronically Signed   By: Franchot Gallo M.D.   On: 08/08/2016 07:46   Ir Thoracentesis Asp Pleural Space W/img Guide  Result Date: 08/07/2016 INDICATION: Patient admitted with cough, dyspnea, orthopnea. Imaging reveals a a prevertebral soft tissue abnormality consistent with a mass as well as bilateral nodular densities in the upper lobes of the lungs. A moderate right pleural effusion is noted as well. Request has been made for diagnostic and therapeutic thoracentesis. EXAM: ULTRASOUND GUIDED DIAGNOSTIC AND THERAPEUTIC THORACENTESIS MEDICATIONS: 1% xylocaine COMPLICATIONS: None immediate. PROCEDURE: An ultrasound guided thoracentesis was thoroughly discussed with the patient and questions answered. The benefits, risks, alternatives and complications were also discussed. The patient understands and wishes to proceed with the procedure. Written consent was obtained. Ultrasound was performed to  localize and mark an adequate pocket of fluid in the right chest. The area was then prepped and draped in the normal sterile fashion. 1% Lidocaine was used for local anesthesia. Under ultrasound guidance a Safe-T-Centesis catheter  was introduced. Thoracentesis was performed. The catheter was removed and a dressing applied. FINDINGS: A total of approximately 0.7 L of serosanguineous fluid was removed. Samples were sent to the laboratory as requested by the clinical team. IMPRESSION: Successful ultrasound guided right thoracentesis yielding 0.7 L of pleural fluid. Postprocedure chest radiograph shows no pneumothorax. Read by: Saverio Danker, PA-C Electronically Signed   By: Lucrezia Europe M.D.   On: 08/07/2016 13:05       *[address], [place] [id number]                            [phone]  ------------------------------------------------------------------- Transthoracic Echocardiography  Patient:    Danetra, Glock MR #:       [id number] Study Date: 08/08/2016 Gender:     F Age:        72 Height:     156.2 cm Weight:     129.3 kg BSA:        2.46 m^2 Pt. Status: Room:       [place], Inpatient  ADMITTING    Reubin Milan  ORDERING     Reubin Milan  REFERRING    Reubin Milan  SONOGRAPHER  Lonny Prude [place]  cc:  ------------------------------------------------------------------- LV EF: 60% -   65%  ------------------------------------------------------------------- Indications:      Abnormal EKG 794.31.  ------------------------------------------------------------------- History:   Risk factors:  Hypertension. Diabetes mellitus.  ------------------------------------------------------------------- Study Conclusions  - Left ventricle: The cavity size was normal. Wall thickness was    increased in a pattern of moderate LVH. Systolic function was   normal. The estimated ejection fraction was in the range of 60%   to 65%. Wall motion was normal; there were no regional wall   motion abnormalities. Doppler parameters are consistent with   abnormal left ventricular relaxation (grade 1 diastolic   dysfunction). The E/e&' ratio is between 8-15, suggesting   indeterminate LV filling pressure. - Mitral valve: Mildly thickened leaflets . There was trivial   regurgitation. - Left atrium: The atrium was normal in size. - Tricuspid valve: There was trivial regurgitation. - Pulmonary arteries: PA peak pressure: 27 mm Hg (S). - Inferior vena cava: The vessel was normal in size. The   respirophasic diameter changes were in the normal range (>= 50%),   consistent with normal central venous pressure.   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  #3 Back pain with large paraspinal mass in the thoracic region with possible epidural extension. No overt neurological symptoms related to this. Neurosurgery was consulted and recommended no interventions.  Plan -I discussed the pathology results in detail with Dr. Gari Crown the hemato-pathologist  for his opinion. -I discussed the diagnosis, prognosis, natural history and treatment options with the patient. -She has already had an echocardiogram and has normal ejection fraction. -Would recommend placing a Port-A-Cath while in house to speed up her time to treatment - order placed by IR to place this tomorrow. Nothing by mouth from midnight. -Will need outpatient PET CT scan for complete  initial staging. -We will plan to treat with R CHOP with G-CSF support. -We'll schedule outpatient follow-up and chemotherapy ASAP. Message sent to schedulers. -We'll start the patient on dexamethasone 8 mg by mouth daily to help with back pain and possibly reduce his platelet with which her pleural fluid reaccumulates . -Imperative to start treating the patient's ASAP due to concerns with epidural extension of the tumor, back pain and recurrent pleural effusions. -Chemotherapy counseling for R CHOP as outpatient. -All of the patient's and her husband's questions were answered in details to their apparent satisfaction.  #4 . Patient Active Problem List   Diagnosis Date Noted  . Abdominal pain, RUQ   . Pleural effusion on right 08/07/2016  . Abnormal EKG 08/07/2016  . 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 Anemia 08/07/2016  . GERD (gastroesophageal reflux disease) 08/07/2016  . Shortness of breath 08/07/2016   -continue f/u with PCP on discharge  -would recommend outpatient sleep study to evaluate for sleep apnea.   I spent 60 minutes counseling the patient face to face. The total time spent in the appointment was 80 minutes and more than 50% was on counseling and direct patient cares.    Sullivan Lone MD [place] AAHIVMS [facility] Hematology/Oncology Physician [facility]  (Office):       [phone] (Work cell):  [phone] (Fax):           [phone]  08/14/2016 2:19 PM

## 2016-08-14 NOTE — Procedures (Signed)
PROCEDURE SUMMARY:  Successful US guided right thoracentesis. Yielded 840 mL of clear, amber colored fluid. Pt tolerated procedure well. No immediate complications.  Specimen was not sent for labs. CXR ordered.  Ascencion Dike PA-C 08/14/2016 11:05 AM

## 2016-08-14 NOTE — Progress Notes (Signed)
PROGRESS NOTE                                                                                                                                                                                                             Patient Demographics:    Jo Collins, is a 66 y.o. female, DOB - April 23, 1950, KJZ:[id number]  Admit date - 08/06/2016   Admitting Physician Reubin Milan, MD  Outpatient Primary MD for the patient is Deland Pretty, MD  LOS - 7  Outpatient Specialists:NONE  Chief Complaint  Patient presents with  . Shortness of Breath       Brief Narrative   66 year old female with history of GERD presented to the ED with progressive nonproductive cough, shortness of breath and orthopnea. She also complains of pain in her right upper quadrant. She was seen in the ED 1 week back for similar symptoms and was discharged home with doxycycline and albuterol inhaler. CT scan done in the ED showed prevertebral soft tissue mass along the descending thoracic aorta and esophagus measuring 7.74.78 cm. Also showed diffuse abdominal and retroperitoneal lymphadenopathy. Patient underwent thoracentesis of right pleural effusion which was exudative.   Subjective:   Feels better after right thoracentesis with about 800 mL fluid removed. Still having pain on the right upper back.   Assessment  & Plan :    Paravertebral mass with right-sided pleural effusion and diffuse abdominal lymphadenopathy Underwent thoracentesis which is exudative, predominantly monocytes and lymphocytes.Cytology showing reactive mesothelial cells only. 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 Results reviewed with neurosurgeon Dr. Ronnald Ramp  who recommended no surgical intervention given the location of tumor and absence of clinical symptoms.  Right inguinal lymph node was biopsied  by IR. Results suggest non-Hodgkin's lymphoma. Discussed with oncologist Dr Irene Limbo : See the patient.     Right upper quadrant pain Secondary to paravertebral mass and right pleural effusion. Still having pain.I will increase her dose of Percocet, continue tramadol and Toradol. I will also add OxyContin 10 mg twice a day.  Abnormal EKG with Q waves in anterior leads. 2-D echo shows LVH with normal LVEF.  GERD Continue PPI  Morbid obesity   Code Status : Full code  Family Communication  : husband at bedside  Disposition Plan  :  pending oncology consult, possibly home tomorrow once pain better controlled with outpatient follow-up.  Barriers For Discharge : Active symptoms  Consults  :   PC CM IR  Procedures  : Right thoracentesis CT of the chest and abdomen MRI thoracic spine   DVT Prophylaxis  :  Lovenox -  Lab Results  Component Value Date   PLT 255 08/13/2016    Antibiotics  :    Anti-infectives    None        Objective:   Vitals:   08/13/16 2145 08/14/16 0548 08/14/16 1205 08/14/16 1434  BP: 133/71 (!) 154/76  120/73  Pulse: 62 63  66  Resp: 16 18    Temp: 98.5 F (36.9 C) 98.1 F (36.7 C)  98.3 F (36.8 C)  TempSrc: Oral Oral  Oral  SpO2: 98% 97% 94% 97%  Weight:      Height:        Wt Readings from Last 3 Encounters:  08/07/16 129.3 kg (285 lb)  07/31/16 129.3 kg (285 lb)     Intake/Output Summary (Last 24 hours) at 08/14/16 1554 Last data filed at 08/14/16 1505  Gross per 24 hour  Intake             1080 ml  Output                0 ml  Net             1080 ml   Physical exam General: Obese female not in distress HEENT: Moist mucosa, supple neck Chest: Improved right lung that sounds CVS: Normal S1 and S2, no murmurs GI: Soft, nondistended, nontender Musculoskeletal: Warm, no edema,right mid/upper back tenderness           Data Review:    CBC  Recent Labs Lab 08/08/16 0509 08/13/16 0528  WBC 9.1 6.6  HGB 11.3*  10.6*  HCT 35.4* 33.3*  PLT 318 255  MCV 84.3 84.3  MCH 26.9 26.8  MCHC 31.9 31.8  RDW 13.6 13.5  LYMPHSABS 1.3  --   MONOABS 0.5  --   EOSABS 1.0*  --   BASOSABS 0.0  --     Chemistries   Recent Labs Lab 08/08/16 0509 08/09/16 0535 08/13/16 0528  NA 139 139 137  K 4.2 3.8 4.1  CL 107 108 104  CO2 22 24 28   GLUCOSE 105* 97 98  BUN 15 13 11   CREATININE 0.92 0.83 0.86  CALCIUM 8.7* 8.4* 8.6*  MG  --  2.0  --   AST  --  17 26  ALT  --  15 23  ALKPHOS  --  43 48  BILITOT  --  0.4 0.5   ------------------------------------------------------------------------------------------------------------------ No results for input(s): CHOL, HDL, LDLCALC, TRIG, CHOLHDL, LDLDIRECT in the last 72 hours.  No results found for: HGBA1C ------------------------------------------------------------------------------------------------------------------ No results for input(s): TSH, T4TOTAL, T3FREE, THYROIDAB in the last 72 hours.  Invalid input(s): FREET3 ------------------------------------------------------------------------------------------------------------------  Recent Labs  08/13/16 0937  VITAMINB12 249  TIBC 227*  IRON 36    Coagulation profile No results for input(s): INR, PROTIME in the last 168 hours.  No results for input(s): DDIMER in the last 72 hours.  Cardiac Enzymes No results for input(s): CKMB, TROPONINI, MYOGLOBIN in the last 168 hours.  Invalid input(s): CK ------------------------------------------------------------------------------------------------------------------    Component Value Date/Time   BNP 39.4 08/07/2016 0023    Inpatient Medications  Scheduled Meds: . dexamethasone  8 mg Oral Daily  . heparin  5,000 Units Subcutaneous Q8H  . lidocaine  1 patch Transdermal Q24H  . lidocaine (PF)      . mouth rinse  15 mL Mouth Rinse BID  . pantoprazole  40 mg Oral Daily  . polyethylene glycol  17 g Oral Daily  . traMADol  100 mg Oral Q6H  .  vitamin B-12  500 mcg Oral Daily   Continuous Infusions: PRN Meds:.ALPRAZolam, ipratropium-albuterol, oxyCODONE-acetaminophen  Micro Results Recent Results (from the past 240 hour(s))  Culture, blood (Routine X 2) w Reflex to ID Panel     Status: None   Collection Time: 08/07/16 12:25 AM  Result Value Ref Range Status   Specimen Description BLOOD RIGHT ANTECUBITAL  Final   Special Requests   Final    BOTTLES DRAWN AEROBIC AND ANAEROBIC Blood Culture adequate volume   Culture NO GROWTH 5 DAYS  Final   Report Status 08/12/2016 FINAL  Final  Culture, blood (Routine X 2) w Reflex to ID Panel     Status: None   Collection Time: 08/07/16 12:45 AM  Result Value Ref Range Status   Specimen Description BLOOD LEFT ANTECUBITAL  Final   Special Requests   Final    BOTTLES DRAWN AEROBIC ONLY Blood Culture adequate volume   Culture NO GROWTH 5 DAYS  Final   Report Status 08/12/2016 FINAL  Final  Gram stain     Status: None   Collection Time: 08/07/16 12:36 PM  Result Value Ref Range Status   Specimen Description PLEURAL RIGHT  Final   Special Requests NONE  Final   Gram Stain   Final    ABUNDANT WBC PRESENT,BOTH PMN AND MONONUCLEAR NO ORGANISMS SEEN    Report Status 08/07/2016 FINAL  Final  Acid Fast Smear (AFB)     Status: None   Collection Time: 08/07/16  1:02 PM  Result Value Ref Range Status   AFB Specimen Processing Concentration  Final   Acid Fast Smear Negative  Final    Comment: (NOTE) Performed At: [facility] [address] [place], [place] [id number] Lindon Romp MD PY:[id number]    Source (AFB) PLEURAL  Final    Comment: RIGHT  Culture, body fluid-bottle     Status: None   Collection Time: 08/07/16  1:02 PM  Result Value Ref Range Status   Specimen Description PLEURAL RIGHT  Final   Special Requests BOTTLES DRAWN AEROBIC AND ANAEROBIC  Final   Culture NO GROWTH 5 DAYS  Final   Report Status 08/12/2016 FINAL  Final    Radiology Reports Dg Chest 1  View  Result Date: 08/14/2016 CLINICAL DATA:  Status post right-sided thoracentesis EXAM: CHEST 1 VIEW COMPARISON:  Chest x-ray dated August 13, 2016 FINDINGS: The left lung is adequately inflated. On the right there is improved aeration at the lung base since thoracentesis. The volume of pleural fluid has decreased somewhat. No postprocedure pneumothorax is observed. The cardiac silhouette remains enlarged. The central pulmonary vascularity is prominent but may be accentuated by patient positioning. IMPRESSION: No postprocedure pneumothorax following right-sided thoracentesis. Some improved aeration of the right lung is seen. Electronically Signed   By: David  Martinique M.D.   On: 08/14/2016 11:20   Dg Chest 1 View  Result Date: 08/07/2016 CLINICAL DATA:  Status post right thoracentesis. EXAM: CHEST 1 VIEW COMPARISON:  08/06/2016 chest radiograph. FINDINGS: Stable cardiomediastinal silhouette with normal heart size. No pneumothorax. Small right pleural effusion, decreased. No left pleural effusion. No pulmonary edema. Patchy right lung base opacity is  mildly decreased. No new consolidative airspace disease. IMPRESSION: 1. No pneumothorax. 2. Small right pleural effusion, decreased . 3. Nonspecific patchy right lung base opacity, decreased. Electronically Signed   By: Ilona Sorrel M.D.   On: 08/07/2016 13:23   Dg Chest 2 View  Result Date: 08/13/2016 CLINICAL DATA:  Right-sided pleural effusion. EXAM: CHEST  2 VIEW COMPARISON:  MRI of the thoracic spine 08/10/2016. Chest x-ray and CT 08/08/16. FINDINGS: Heart size normal. A right paraspinal mass is again noted. A right pleural effusion is increasing. No significant consolidation is present on the left. IMPRESSION: 1. Increasing right-sided pleural effusion and airspace opacity. 2. Right paraspinal mass. Electronically Signed   By: San Morelle M.D.   On: 08/13/2016 14:35   Dg Chest 2 View  Result Date: 08/06/2016 CLINICAL DATA:  Acute onset of  shortness of breath and lower back pain. Initial encounter. EXAM: CHEST  2 VIEW COMPARISON:  Chest radiograph performed 07/31/2016 FINDINGS: A small-to-moderate right-sided pleural effusion is noted, with right basilar airspace opacification, concerning for pneumonia. No pneumothorax is seen. The left lung appears clear. The heart is borderline normal in size. No acute osseous abnormalities are identified. IMPRESSION: Small to moderate right-sided pleural effusion, with right basilar airspace opacification, concerning for pneumonia. Electronically Signed   By: Garald Balding M.D.   On: 08/06/2016 23:15   Dg Chest 2 View  Result Date: 07/31/2016 CLINICAL DATA:  Three days of nonproductive cough associated with shortness of breath and wheezing. Current smoker. EXAM: CHEST  2 VIEW COMPARISON:  Report of a chest x-ray of May 28, 2001. FINDINGS: The left lung is clear. On the right there is volume loss with obscuration of the hemidiaphragm. There is fluid in the minor fissure. There is no mediastinal shift. The heart and pulmonary vascularity are normal. There is mild tortuosity of the ascending thoracic aorta. The bony thorax exhibits no acute abnormality. IMPRESSION: Atelectasis or pneumonia with right pleural E fusion. Followup PA and lateral chest X-ray is recommended in 3-4 weeks following trial of antibiotic therapy to ensure resolution and exclude underlying malignancy. Electronically Signed   By: David  Martinique M.D.   On: 07/31/2016 16:50   Ct Chest Wo Contrast  Result Date: 08/07/2016 CLINICAL DATA:  Dyspnea and lower back pain EXAM: CT CHEST WITHOUT CONTRAST TECHNIQUE: Multidetector CT imaging of the chest was performed following the standard protocol without IV contrast. COMPARISON:  CXR exams dating back through 07/31/2016 FINDINGS: Cardiovascular: Normal size cardiac chambers without pericardial effusion. Mitral annular calcifications are noted. Minimal coronary arteriosclerosis along the distal LAD.  4.2 cm ascending aortic aneurysm. Dilatation of the main pulmonary artery 3.5 cm consistent with a component of pulmonary hypertension. Mediastinum/Nodes: Prevertebral masslike abnormality along the descending thoracic aorta and esophagus possibly representing enlarged lymph node mass. This is estimated at 7.7 x 4.7 x 8 cm in transverse by AP by craniocaudad dimension. Further assessment is limited due to lack of oral and IV contrast. Lungs/Pleura: Loculated right moderate-sized pleural effusion with compressive atelectasis. Tiny subpleural 4 and 5 mm nodular densities are seen the left upper lobe with pleural-based 4 mm right upper lobe nodular density also noted. These are nonspecific. Upper Abdomen: Retrocrural adenopathy is seen on the left measuring 2.1 cm short axis. Musculoskeletal: Thoracolumbar spondylosis. No acute osseous abnormality. No lytic or blastic disease. IMPRESSION: Confluent prevertebral soft tissue abnormality along the descending thoracic aorta and esophagus. Findings are concerning for an enlarged lymph node mass given retrocrural adenopathy also seen. This confluent masslike  abnormality measures 7.7 x 4.7 x 8 cm. Moderate loculated right-sided pleural effusion with compressive atelectasis. Some of the atelectasis could potentially be post obstructive due to the adjacent mediastinal mass. Bilateral 5 mm or less nodular densities in the upper lobes. No follow-up needed if patient is low-risk (and has no known or suspected primary neoplasm). Non-contrast chest CT can be considered in 12 months if patient is high-risk. This recommendation follows the consensus statement: Guidelines for Management of Incidental Pulmonary Nodules Detected on CT Images: From the Fleischner Society 2017; Radiology 2017; 284:228-243. Electronically Signed   By: Ashley Royalty M.D.   On: 08/07/2016 00:31   Ct Chest W Contrast  Result Date: 08/08/2016 CLINICAL DATA:  66 year old female with mediastinal adenopathy and  shortness breath. Post thoracentesis. Cytology pending. Recently quit smoking. Evaluate for metastatic disease. Post abdominal hysterectomy. Subsequent encounter. EXAM: CT CHEST, ABDOMEN, AND PELVIS WITH CONTRAST TECHNIQUE: Multidetector CT imaging of the chest, abdomen and pelvis was performed following the standard protocol during bolus administration of intravenous contrast. CONTRAST:  14mL ISOVUE-300 IOPAMIDOL (ISOVUE-300) INJECTION 61% COMPARISON:  08/07/2016 chest x-ray and chest CT. 05/24/2005 CT of the abdomen and pelvis. FINDINGS: CT CHEST FINDINGS Cardiovascular: Heart size within normal limits. Prominent mitral valve calcifications. Trace coronary artery calcifications. Prominent ascending thoracic aorta measuring up to 4 cm transverse dimension. Exam not tailored to evaluate for pulmonary embolus. No central pulmonary embolus noted. Main pulmonary artery prominent measuring 3.6 cm which may reflect component of pulmonary hypertension. 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 Question epidural extension of tumor versus engorged epidural veins. Contrast-enhanced MR can be obtained for further delineation of clinically desired. Lungs/Pleura: Partially loculated right-sided pleural effusion. Pleural extension of tumor posterior inferior right thorax (series 3, images 30 through 36). Confluent atelectasis with air bronchograms right lower lobe right middle lobe. Less confluent atelectasis inferior right upper lobe. No central obstructing mass identified. Tethered pleura with sub pleural thickening greatest lung base. Multiple tiny bilateral nodules throughout all lung zones. Accessory fissure left upper lobe incidentally noted. Trachea and mainstem bronchi are patent. Musculoskeletal: Although thoracic  vertebra surrounded by mass, no obvious destruction. Degenerative changes throughout the lower cervical and thoracic spine. CT ABDOMEN PELVIS FINDINGS Hepatobiliary: Significantly enlarged liver spanning over 25.6 cm without focal hepatic lesion noted. No calcified gallstones. Pancreas: No primary pancreatic mass or inflammation. Spleen: No splenic mass or enlargement. Adrenals/Urinary Tract: No hydronephrosis. Left lower pole 1 cm low-density structure statistically likely a cyst. No adrenal mass. Noncontrast filled views of the urinary bladder with limited evaluation secondary to habitus/ artifact. It would be difficult on the present examination to exclude a primary bladder abnormality. Stomach/Bowel: Diverticulosis with regions of muscular hypertrophy. No extraluminal bowel inflammatory process. Limited for evaluating for possibility of bowel/ stomach primary mass secondary to under distension. No obvious abnormality noted. Vascular/Lymphatic: Retroperitoneal, upper abdominal, mesenteric, pelvic and right inguinal adenopathy. Index lymph node right external iliac region measures 5.1 x 3.1 cm. Right inguinal adenopathy which may be assessable to biopsy measures 2.7 x 2 x 2.7 cm. Trace abdominal aortic calcification without aneurysm. No large arterial vessel occlusion. Reproductive: Post hysterectomy.  No obvious adnexal mass. Other: No free intraperitoneal air. Injection granulomas and subcutaneous gas from injection. Musculoskeletal: No destructive lesion identified. Degenerative changes most prominent L4-5 level with facet degenerative changes, mild anterior slip L4 and bulge  contribute to multifactorial spinal stenosis. 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 Question epidural extension of tumor versus engorged  epidural veins. Contrast-enhanced MR can be obtained for further delineation of clinically desired. Abdominal region retroperitoneal upper abdominal, mesenteric, pelvic and right inguinal adenopathy. Index lymph node right external iliac region measures 5.1 x 3.1 cm. Right inguinal adenopathy which may be assessable to biopsy measures 2.7 x 2 x 2.7 cm. Partially loculated right-sided pleural effusion. Pleural extension of tumor posterior inferior right thorax (series 3, images 30 through 36). Confluent atelectasis with air bronchograms right lower lobe right middle lobe. Less confluent atelectasis inferior right upper lobe. No central obstructing mass identified. Tethered pleura with sub pleural thickening greatest lung base. Multiple tiny bilateral nodules throughout all lung zones. Findings may reflect tumor such as small cell primary lung neoplasm in this smoker. Adenopathy from lymphoma is a possibility. Adenopathy and spread of tumor to lung from other primary tumor not excluded although other primary lesion not identified (evaluation of bowel limited by under distension without obvious mass). Significantly enlarged liver spanning over 25.6 cm. Aortic Atherosclerosis (ICD10-I70.0). Prominent ascending thoracic aorta measuring up to 4 cm transverse dimension. Recommend annual imaging followup by CTA or MRA. This recommendation follows 2010 ACCF/AHA/AATS/ACR/ASA/SCA/SCAI/SIR/STS/SVM Guidelines for the Diagnosis and Management of Patients with Thoracic Aortic Disease. Circulation. 2010; 121: I502-D741 Exam not tailored to evaluate for pulmonary embolus. No central pulmonary embolus noted. Main pulmonary artery prominent measuring 3.6 cm which may reflect component of pulmonary hypertension. Electronically Signed   By: Genia Del M.D.   On: 08/08/2016 07:45   Mr Thoracic Spine W Wo Contrast  Result Date: 08/10/2016 CLINICAL DATA:  66 year old female with mediastinal lymphadenopathy and shortness of breath.  Status post thoracentesis 08/07/2016. Prevertebral soft tissue mass by recent chest CT. EXAM: MRI THORACIC WITHOUT AND WITH CONTRAST TECHNIQUE: Multiplanar and multiecho pulse sequences of the thoracic spine were obtained without and with intravenous contrast. CONTRAST:  20 ml MULTIHANCE GADOBENATE DIMEGLUMINE 529 MG/ML IV SOLN COMPARISON:  CT chest 08/09/2015. FINDINGS: MRI THORACIC SPINE FINDINGS Alignment:  Maintained. Vertebrae: No fracture. STIR imaging demonstrates marrow edema in the T8 and T9 vertebral bodies with corresponding decreased T1 signal most consistent with tumor infiltration. More subtle decreased T1 signal is identified in the T7, T10, T11 and likely T12 vertebral bodies worrisome for tumor infiltration. Cord:  Demonstrates normal signal. Paraspinal and other soft tissues: Right pleural effusion is identified. Large prevertebral tumor mass extending along both the right and left sides of multiple vertebral bodies is identified as seen on prior CT scan. There is extension of tumor along the pleura bilaterally. Large right pleural effusion is noted. Disc levels: Multilevel degenerative disease of the cervical spine is seen in the sagittal plane only. Axial imaging is limited but there is epidural tumor extending from approximately T7 through T11 as seen on the sagittal postcontrast images. Tumor bulk is largest from mid T8 to upper T10 where it is eccentrically prominent to the right. Epidural tumor on the right and anteriorly from the T8-9 to the T9-10 levels compresses and deforms the cord. See axial images 14-21 of series 12. No associated cord signal abnormality is identified. Disc bulging is seen in the upper and mid cervical spine but does not cause central canal stenosis. IMPRESSION: Large paraspinal mass as seen on prior CT scan. Abnormal marrow  signal from T7-T11 is most conspicuous in T8 and T9 and consistent with tumor. Epidural tumor is seen from T7-T11 and most prominent at T9 where  anterior and right side tumor deform the cord. Epidural tumor is best seen on axial images 14-21 of series 12. Right pleural effusion. Multilevel cervical and thoracic spondylosis. Electronically Signed   By: Inge Rise M.D.   On: 08/10/2016 11:27   Ct Abdomen Pelvis W Contrast  Result Date: 08/08/2016 CLINICAL DATA:  66 year old female with mediastinal adenopathy and shortness breath. Post thoracentesis. Cytology pending. Recently quit smoking. Evaluate for metastatic disease. Post abdominal hysterectomy. Subsequent encounter. EXAM: CT CHEST, ABDOMEN, AND PELVIS WITH CONTRAST TECHNIQUE: Multidetector CT imaging of the chest, abdomen and pelvis was performed following the standard protocol during bolus administration of intravenous contrast. CONTRAST:  147mL ISOVUE-300 IOPAMIDOL (ISOVUE-300) INJECTION 61% COMPARISON:  08/07/2016 chest x-ray and chest CT. 05/24/2005 CT of the abdomen and pelvis. FINDINGS: CT CHEST FINDINGS Cardiovascular: Heart size within normal limits. Prominent mitral valve calcifications. Trace coronary artery calcifications. Prominent ascending thoracic aorta measuring up to 4 cm transverse dimension. Exam not tailored to evaluate for pulmonary embolus. No central pulmonary embolus noted. Main pulmonary artery prominent measuring 3.6 cm which may reflect component of pulmonary hypertension. 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 Question epidural extension of tumor versus engorged epidural veins. Contrast-enhanced MR can be obtained for further delineation of clinically desired. Lungs/Pleura: Partially loculated right-sided pleural effusion. Pleural extension of tumor posterior inferior right thorax (series 3, images 30 through 36). Confluent atelectasis with air bronchograms  right lower lobe right middle lobe. Less confluent atelectasis inferior right upper lobe. No central obstructing mass identified. Tethered pleura with sub pleural thickening greatest lung base. Multiple tiny bilateral nodules throughout all lung zones. Accessory fissure left upper lobe incidentally noted. Trachea and mainstem bronchi are patent. Musculoskeletal: Although thoracic vertebra surrounded by mass, no obvious destruction. Degenerative changes throughout the lower cervical and thoracic spine. CT ABDOMEN PELVIS FINDINGS Hepatobiliary: Significantly enlarged liver spanning over 25.6 cm without focal hepatic lesion noted. No calcified gallstones. Pancreas: No primary pancreatic mass or inflammation. Spleen: No splenic mass or enlargement. Adrenals/Urinary Tract: No hydronephrosis. Left lower pole 1 cm low-density structure statistically likely a cyst. No adrenal mass. Noncontrast filled views of the urinary bladder with limited evaluation secondary to habitus/ artifact. It would be difficult on the present examination to exclude a primary bladder abnormality. Stomach/Bowel: Diverticulosis with regions of muscular hypertrophy. No extraluminal bowel inflammatory process. Limited for evaluating for possibility of bowel/ stomach primary mass secondary to under distension. No obvious abnormality noted. Vascular/Lymphatic: Retroperitoneal, upper abdominal, mesenteric, pelvic and right inguinal adenopathy. Index lymph node right external iliac region measures 5.1 x 3.1 cm. Right inguinal adenopathy which may be assessable to biopsy measures 2.7 x 2 x 2.7 cm. Trace abdominal aortic calcification without aneurysm. No large arterial vessel occlusion. Reproductive: Post hysterectomy.  No obvious adnexal mass. Other: No free intraperitoneal air. Injection granulomas and subcutaneous gas from injection. Musculoskeletal: No destructive lesion identified. Degenerative changes most prominent L4-5 level with facet degenerative  changes, mild anterior slip L4 and bulge contribute to multifactorial spinal stenosis. 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 Question epidural  extension of tumor versus engorged epidural veins. Contrast-enhanced MR can be obtained for further delineation of clinically desired. Abdominal region retroperitoneal upper abdominal, mesenteric, pelvic and right inguinal adenopathy. Index lymph node right external iliac region measures 5.1 x 3.1 cm. Right inguinal adenopathy which may be assessable to biopsy measures 2.7 x 2 x 2.7 cm. Partially loculated right-sided pleural effusion. Pleural extension of tumor posterior inferior right thorax (series 3, images 30 through 36). Confluent atelectasis with air bronchograms right lower lobe right middle lobe. Less confluent atelectasis inferior right upper lobe. No central obstructing mass identified. Tethered pleura with sub pleural thickening greatest lung base. Multiple tiny bilateral nodules throughout all lung zones. Findings may reflect tumor such as small cell primary lung neoplasm in this smoker. Adenopathy from lymphoma is a possibility. Adenopathy and spread of tumor to lung from other primary tumor not excluded although other primary lesion not identified (evaluation of bowel limited by under distension without obvious mass). Significantly enlarged liver spanning over 25.6 cm. Aortic Atherosclerosis (ICD10-I70.0). Prominent ascending thoracic aorta measuring up to 4 cm transverse dimension. Recommend annual imaging followup by CTA or MRA. This recommendation follows 2010 ACCF/AHA/AATS/ACR/ASA/SCA/SCAI/SIR/STS/SVM Guidelines for the Diagnosis and Management of Patients with Thoracic Aortic Disease. Circulation. 2010; 121: P710-G269 Exam not tailored to evaluate for  pulmonary embolus. No central pulmonary embolus noted. Main pulmonary artery prominent measuring 3.6 cm which may reflect component of pulmonary hypertension. Electronically Signed   By: Genia Del M.D.   On: 08/08/2016 07:45   US Biopsy  Result Date: 08/09/2016 INDICATION: 66 year old female with newly diagnosed multifocal lymphadenopathy highly concerning for malignant lymphoma. EXAM: Ultrasound-guided core biopsy right inguinal lymph node MEDICATIONS: None. ANESTHESIA/SEDATION: Moderate (conscious) sedation was employed during this procedure. A total of Versed 1.5 mg and Fentanyl 75 mcg was administered intravenously. Moderate Sedation Time: 13 minutes. The patient's level of consciousness and vital signs were monitored continuously by radiology nursing throughout the procedure under my direct supervision. FLUOROSCOPY TIME:  Fluoroscopy Time: 0 minutes 0 seconds (0 mGy). COMPLICATIONS: None immediate. PROCEDURE: Informed written consent was obtained from the patient after a thorough discussion of the procedural risks, benefits and alternatives. All questions were addressed. A timeout was performed prior to the initiation of the procedure. The right inguinal region was interrogated with ultrasound. A hypoechoic lymph node with internal vascularity measures 2.7 x 1.4 cm. A suitable skin entry site was selected and marked. Following sterile prep and drape in the standard fashion with chlorhexidine skin prep, local anesthesia was attained by infiltration with 1% lidocaine. A small dermatotomy was made. Using real-time sonographic guidance, multiple 16 gauge core biopsies were obtained using the Bard automated biopsy device. Biopsy specimens were placed in saline and delivered to pathology for further analysis. Post biopsy ultrasound imaging demonstrates no evidence of active bleeding or complication. IMPRESSION: Technically successful core biopsy of abnormal right inguinal lymph node. Signed, Criselda Peaches, MD [facility] Electronically Signed   By: Jacqulynn Cadet M.D.   On: 08/09/2016 13:12   Dg Chest Port 1 View  Result Date: 08/08/2016 CLINICAL DATA:  Respiratory failure EXAM: PORTABLE CHEST 1 VIEW COMPARISON:  08/07/2016 FINDINGS: Right pleural effusion and right lower lobe airspace disease unchanged. Left lung remains clear. Heart size normal. Negative for heart failure. IMPRESSION: Right lower lobe airspace disease and right pleural effusion unchanged. Electronically Signed   By: Franchot Gallo M.D.   On: 08/08/2016 07:46   Ir Thoracentesis Asp Pleural Space W/img  Guide  Result Date: 08/07/2016 INDICATION: Patient admitted with cough, dyspnea, orthopnea. Imaging reveals a a prevertebral soft tissue abnormality consistent with a mass as well as bilateral nodular densities in the upper lobes of the lungs. A moderate right pleural effusion is noted as well. Request has been made for diagnostic and therapeutic thoracentesis. EXAM: ULTRASOUND GUIDED DIAGNOSTIC AND THERAPEUTIC THORACENTESIS MEDICATIONS: 1% xylocaine COMPLICATIONS: None immediate. PROCEDURE: An ultrasound guided thoracentesis was thoroughly discussed with the patient and questions answered. The benefits, risks, alternatives and complications were also discussed. The patient understands and wishes to proceed with the procedure. Written consent was obtained. Ultrasound was performed to localize and mark an adequate pocket of fluid in the right chest. The area was then prepped and draped in the normal sterile fashion. 1% Lidocaine was used for local anesthesia. Under ultrasound guidance a Safe-T-Centesis catheter was introduced. Thoracentesis was performed. The catheter was removed and a dressing applied. FINDINGS: A total of approximately 0.7 L of serosanguineous fluid was removed. Samples were sent to the laboratory as requested by the clinical team. IMPRESSION: Successful  ultrasound guided right thoracentesis yielding 0.7 L of pleural fluid. Postprocedure chest radiograph shows no pneumothorax. Read by: Saverio Danker, PA-C Electronically Signed   By: Lucrezia Europe M.D.   On: 08/07/2016 13:05    Time Spent in minutes  25   Louellen Molder M.D on 08/14/2016 at 3:54 PM  Between 7am to 7pm - Pager - [phone]  After 7pm go to [email or web address] - password [facility]  [facility] -  Office  [phone]

## 2016-08-15 ENCOUNTER — Other Ambulatory Visit: Payer: Self-pay | Admitting: Hematology

## 2016-08-15 DIAGNOSIS — C8228 Follicular lymphoma grade III, unspecified, lymph nodes of multiple sites: Secondary | ICD-10-CM

## 2016-08-15 DIAGNOSIS — R591 Generalized enlarged lymph nodes: Secondary | ICD-10-CM

## 2016-08-15 DIAGNOSIS — D518 Other vitamin B12 deficiency anemias: Secondary | ICD-10-CM

## 2016-08-15 DIAGNOSIS — Z6841 Body Mass Index (BMI) 40.0 and over, adult: Secondary | ICD-10-CM

## 2016-08-15 DIAGNOSIS — C8590 Non-Hodgkin lymphoma, unspecified, unspecified site: Secondary | ICD-10-CM

## 2016-08-15 DIAGNOSIS — M546 Pain in thoracic spine: Secondary | ICD-10-CM

## 2016-08-15 MED ORDER — DEXAMETHASONE 4 MG PO TABS: 8.0000 mg | ORAL_TABLET | Freq: Every day | ORAL | 0 refills | Status: DC

## 2016-08-15 MED ORDER — LIDOCAINE 5 % EX PTCH: 1.0000 | MEDICATED_PATCH | CUTANEOUS | 0 refills | Status: DC

## 2016-08-15 MED ORDER — CYANOCOBALAMIN 500 MCG PO TABS: 500.0000 ug | ORAL_TABLET | Freq: Every day | ORAL | 0 refills | Status: DC

## 2016-08-15 MED ORDER — OXYCODONE-ACETAMINOPHEN 5-325 MG PO TABS: 2.0000 | ORAL_TABLET | ORAL | 0 refills | Status: DC | PRN

## 2016-08-15 MED ORDER — OXYCODONE HCL ER 10 MG PO T12A
10.0000 mg | EXTENDED_RELEASE_TABLET | Freq: Two times a day (BID) | ORAL | 0 refills | Status: DC
Start: 2016-08-15 — End: 2016-08-22

## 2016-08-15 MED ORDER — POLYETHYLENE GLYCOL 3350 17 G PO PACK: 17.0000 g | PACK | Freq: Every day | ORAL | 0 refills | Status: DC

## 2016-08-15 NOTE — Progress Notes (Signed)
Patient ID: Jo Collins, female   DOB: Oct 25, 1950, 66 y.o.   MRN: [id number]   [facility] requested per [facility] and Dr Irene Limbo New dx Lymphoma  IR schedule unable to accommodate [facility] today; or probably tomorrow  I have discussed with [facility] Dr Clementeen Graham and Dr Ocie Bob for OP [facility] per both services Please place within 5 days is the request  [facility] will DC soon Scheduler has information and will call pt for appt time and date.

## 2016-08-15 NOTE — Progress Notes (Signed)
Pt and husband verbally understood DC instructions, no questions asked. Rx given to pt.Jo Collins

## 2016-08-15 NOTE — Discharge Summary (Signed)
Physician Discharge Summary  Ivyonna Hoelzel WCB:[id number] DOB: 28-Jun-1950 DOA: 08/06/2016  PCP: Deland Pretty, MD  Admit date: 08/06/2016 Discharge date: 08/15/2016  Admitted From: home Disposition:  home  Recommendations for Outpatient Follow-up:  #1 Follow up with Dr Irene Limbo in 1 week #2 IR will arrange outpatient Port-A-Cath placement for initiating chemotherapy within the next few days. #3 Please arrange outpatient sleep study for possible obstructive sleep apnea.  Home Health:none Equipment/Devices:none  Discharge Condition:rare CODE STATUS:full code Diet recommendation: regular   Discharge Diagnoses:  Principal Problem:   NHL (non-Hodgkin's lymphoma) (Bud)  Active Problems:   Pleural effusion on right (malignant versus reactive)   Abnormal EKG   Intrathoracic mass   Anemia   GERD (gastroesophageal reflux disease)   Shortness of breath   Abdominal pain, RUQ  Brief narrative/history of present illness 66 year old female with history of GERD presented to the ED with progressive nonproductive cough, shortness of breath and orthopnea. She also complains of pain in her right upper quadrant. She was seen in the ED 1 week back for similar symptoms and was discharged home with doxycycline and albuterol inhaler. CT scan done in the ED showed prevertebral soft tissue mass along the descending thoracic aorta and esophagus measuring 7.74.78 cm. Also showed diffuse abdominal and retroperitoneal lymphadenopathy. Patient underwent thoracentesis of right pleural effusion which was exudative.  Hospital course Paravertebral mass with right-sided pleural effusion and diffuse abdominal lymphadenopathy Underwent right thoracentesis x 2  which is exudative, predominantly monocytes and lymphocytes.Cytology showing reactive mesothelial cells only. 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 Results reviewed with  neurosurgeon Dr. Ronnald Ramp  who recommended no surgical intervention given the location of tumor and absence of clinical symptoms.  Right inguinal lymph node was biopsied by IR. Results suggest non-Hodgkin's lymphoma. oncologist Dr Irene Limbo consulted. Feels this is likely stage IIIB non-Hodgkin's lymphoma possibly germinal center type as large B-cell lymphoma. -recommends Port-A-Cath placement and  on PET CT scan as outpatient for staging. Plan on treatment with [facility] with GSF soon as outpatient.  -Discussed with IR , they will not be able to  place Port-A-Cath today due to scheduling unavailability and will arrange outpatient procedure within the next few days.   patient can be discharged home with outpatient follow-up.  Right upper quadrant pain Secondary to paravertebral mass and right pleural effusion.  Placed on Percocet, Lidoderm patch and added OxyContin twice a day. Also added daily Decadron by oncologist. Symptoms improved after repeat right thoracentesis on 8/13. Will discharge home with pain prescriptions and Decadron.  Abnormal EKG with Q waves in anterior leads. 2-D echo shows LVH with normal LVEF.  GERD Continue PPI  Morbid obesity (BMI >50) counseled on diet and exercise. Patient likely has OSA/OHS and would benefit from outpatient sleep study.  Vitamin B12 deficiency Added supplement  Family Communication  : husband at bedside  Disposition Plan  : home  Consults  :   PC CM IR Oncology(Dr. Irene Limbo) Neurosurgery (Dr. Ronnald Ramp)  Procedures  : Right thoracentesis x 2 CT of the chest and abdomen MRI thoracic spine    Discharge Instructions   Allergies as of 08/15/2016   No Known Allergies     Medication List    TAKE these medications   acetaminophen 500 MG tablet Commonly known as:  TYLENOL Take 1,000 mg by mouth every 6 (six) hours as needed for moderate pain.   albuterol 108 (90 Base) MCG/ACT inhaler Commonly  known as:  PROVENTIL HFA;VENTOLIN  HFA Inhale 2 puffs into the lungs every 6 (six) hours as needed for wheezing or shortness of breath.   cyanocobalamin 500 MCG tablet Take 1 tablet (500 mcg total) by mouth daily.   dexamethasone 4 MG tablet Commonly known as:  DECADRON Take 2 tablets (8 mg total) by mouth daily.   lidocaine 5 % Commonly known as:  LIDODERM Place 1 patch onto the skin daily. Remove & Discard patch within 12 hours or as directed by MD   magnesium oxide 400 MG tablet Commonly known as:  MAG-OX Take 400 mg by mouth daily.   multivitamin with minerals Tabs tablet Take 1 tablet by mouth daily.   oxyCODONE 10 mg 12 hr tablet Commonly known as:  OXYCONTIN Take 1 tablet (10 mg total) by mouth every 12 (twelve) hours.   oxyCODONE-acetaminophen 5-325 MG tablet Commonly known as:  PERCOCET/ROXICET Take 2 tablets by mouth every 4 (four) hours as needed for moderate pain.   polyethylene glycol packet Commonly known as:  MIRALAX / GLYCOLAX Take 17 g by mouth daily.      Follow-up Information    Deland Pretty, MD. Schedule an appointment as soon as possible for a visit in 1 week(s).   Specialty:  Internal Medicine Contact information: [address] [place] [address] [phone]        Brunetta Genera, MD Follow up in 1 week(s).   Specialties:  Hematology, Oncology Contact information: [place] [address] [phone]          No Known Allergies    Procedures/Studies: Dg Chest 1 View  Result Date: 08/14/2016 CLINICAL DATA:  Status post right-sided thoracentesis EXAM: CHEST 1 VIEW COMPARISON:  Chest x-ray dated August 13, 2016 FINDINGS: The left lung is adequately inflated. On the right there is improved aeration at the lung base since thoracentesis. The volume of pleural fluid has decreased somewhat. No postprocedure pneumothorax is observed. The cardiac silhouette remains enlarged. The central pulmonary vascularity is prominent but  may be accentuated by patient positioning. IMPRESSION: No postprocedure pneumothorax following right-sided thoracentesis. Some improved aeration of the right lung is seen. Electronically Signed   By: David  Martinique M.D.   On: 08/14/2016 11:20   Dg Chest 1 View  Result Date: 08/07/2016 CLINICAL DATA:  Status post right thoracentesis. EXAM: CHEST 1 VIEW COMPARISON:  08/06/2016 chest radiograph. FINDINGS: Stable cardiomediastinal silhouette with normal heart size. No pneumothorax. Small right pleural effusion, decreased. No left pleural effusion. No pulmonary edema. Patchy right lung base opacity is mildly decreased. No new consolidative airspace disease. IMPRESSION: 1. No pneumothorax. 2. Small right pleural effusion, decreased . 3. Nonspecific patchy right lung base opacity, decreased. Electronically Signed   By: Ilona Sorrel M.D.   On: 08/07/2016 13:23   Dg Chest 2 View  Result Date: 08/13/2016 CLINICAL DATA:  Right-sided pleural effusion. EXAM: CHEST  2 VIEW COMPARISON:  MRI of the thoracic spine 08/10/2016. Chest x-ray and CT 08/08/16. FINDINGS: Heart size normal. A right paraspinal mass is again noted. A right pleural effusion is increasing. No significant consolidation is present on the left. IMPRESSION: 1. Increasing right-sided pleural effusion and airspace opacity. 2. Right paraspinal mass. Electronically Signed   By: San Morelle M.D.   On: 08/13/2016 14:35   Dg Chest 2 View  Result Date: 08/06/2016 CLINICAL DATA:  Acute onset of shortness of breath and lower back pain. Initial encounter. EXAM: CHEST  2 VIEW COMPARISON:  Chest radiograph performed 07/31/2016 FINDINGS: A small-to-moderate right-sided pleural effusion is noted, with right basilar airspace opacification, concerning for pneumonia. No pneumothorax is seen. The left lung appears clear. The heart is borderline normal in size. No acute osseous abnormalities are identified. IMPRESSION: Small to moderate right-sided pleural effusion,  with right basilar airspace opacification, concerning for pneumonia. Electronically Signed   By: Garald Balding M.D.   On: 08/06/2016 23:15   Dg Chest 2 View  Result Date: 07/31/2016 CLINICAL DATA:  Three days of nonproductive cough associated with shortness of breath and wheezing. Current smoker. EXAM: CHEST  2 VIEW COMPARISON:  Report of a chest x-ray of May 28, 2001. FINDINGS: The left lung is clear. On the right there is volume loss with obscuration of the hemidiaphragm. There is fluid in the minor fissure. There is no mediastinal shift. The heart and pulmonary vascularity are normal. There is mild tortuosity of the ascending thoracic aorta. The bony thorax exhibits no acute abnormality. IMPRESSION: Atelectasis or pneumonia with right pleural E fusion. Followup PA and lateral chest X-ray is recommended in 3-4 weeks following trial of antibiotic therapy to ensure resolution and exclude underlying malignancy. Electronically Signed   By: David  Martinique M.D.   On: 07/31/2016 16:50   Ct Chest Wo Contrast  Result Date: 08/07/2016 CLINICAL DATA:  Dyspnea and lower back pain EXAM: CT CHEST WITHOUT CONTRAST TECHNIQUE: Multidetector CT imaging of the chest was performed following the standard protocol without IV contrast. COMPARISON:  CXR exams dating back through 07/31/2016 FINDINGS: Cardiovascular: Normal size cardiac chambers without pericardial effusion. Mitral annular calcifications are noted. Minimal coronary arteriosclerosis along the distal LAD. 4.2 cm ascending aortic aneurysm. Dilatation of the main pulmonary artery 3.5 cm consistent with a component of pulmonary hypertension. Mediastinum/Nodes: Prevertebral masslike abnormality along the descending thoracic aorta and esophagus possibly representing enlarged lymph node mass. This is estimated at 7.7 x 4.7 x 8 cm in transverse by AP by craniocaudad dimension. Further assessment is limited due to lack of oral and IV contrast. Lungs/Pleura: Loculated right  moderate-sized pleural effusion with compressive atelectasis. Tiny subpleural 4 and 5 mm nodular densities are seen the left upper lobe with pleural-based 4 mm right upper lobe nodular density also noted. These are nonspecific. Upper Abdomen: Retrocrural adenopathy is seen on the left measuring 2.1 cm short axis. Musculoskeletal: Thoracolumbar spondylosis. No acute osseous abnormality. No lytic or blastic disease. IMPRESSION: Confluent prevertebral soft tissue abnormality along the descending thoracic aorta and esophagus. Findings are concerning for an enlarged lymph node mass given retrocrural adenopathy also seen. This confluent masslike abnormality measures 7.7 x 4.7 x 8 cm. Moderate loculated right-sided pleural effusion with compressive atelectasis. Some of the atelectasis could potentially be post obstructive due to the adjacent mediastinal mass. Bilateral 5 mm or less nodular densities in the upper lobes. No follow-up needed if patient is low-risk (and has no known or suspected primary neoplasm). Non-contrast chest CT can be considered in 12 months if patient is high-risk. This recommendation follows the consensus statement: Guidelines for Management of Incidental Pulmonary Nodules Detected on CT Images: From the Fleischner Society 2017; Radiology 2017; 284:228-243. Electronically Signed   By: Ashley Royalty M.D.   On: 08/07/2016 00:31   Ct Chest W Contrast  Result Date: 08/08/2016 CLINICAL DATA:  66 year old female with mediastinal adenopathy and shortness breath. Post thoracentesis. Cytology pending. Recently quit smoking. Evaluate for metastatic disease. Post abdominal hysterectomy. Subsequent encounter. EXAM: CT CHEST, ABDOMEN, AND PELVIS WITH CONTRAST TECHNIQUE: Multidetector CT imaging  of the chest, abdomen and pelvis was performed following the standard protocol during bolus administration of intravenous contrast. CONTRAST:  146mL ISOVUE-300 IOPAMIDOL (ISOVUE-300) INJECTION 61% COMPARISON:  08/07/2016  chest x-ray and chest CT. 05/24/2005 CT of the abdomen and pelvis. FINDINGS: CT CHEST FINDINGS Cardiovascular: Heart size within normal limits. Prominent mitral valve calcifications. Trace coronary artery calcifications. Prominent ascending thoracic aorta measuring up to 4 cm transverse dimension. Exam not tailored to evaluate for pulmonary embolus. No central pulmonary embolus noted. Main pulmonary artery prominent measuring 3.6 cm which may reflect component of pulmonary hypertension. 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 Question epidural extension of tumor versus engorged epidural veins. Contrast-enhanced MR can be obtained for further delineation of clinically desired. Lungs/Pleura: Partially loculated right-sided pleural effusion. Pleural extension of tumor posterior inferior right thorax (series 3, images 30 through 36). Confluent atelectasis with air bronchograms right lower lobe right middle lobe. Less confluent atelectasis inferior right upper lobe. No central obstructing mass identified. Tethered pleura with sub pleural thickening greatest lung base. Multiple tiny bilateral nodules throughout all lung zones. Accessory fissure left upper lobe incidentally noted. Trachea and mainstem bronchi are patent. Musculoskeletal: Although thoracic vertebra surrounded by mass, no obvious destruction. Degenerative changes throughout the lower cervical and thoracic spine. CT ABDOMEN PELVIS FINDINGS Hepatobiliary: Significantly enlarged liver spanning over 25.6 cm without focal hepatic lesion noted. No calcified gallstones. Pancreas: No primary pancreatic mass or inflammation. Spleen: No splenic mass or enlargement. Adrenals/Urinary Tract: No hydronephrosis. Left lower pole 1 cm low-density structure statistically  likely a cyst. No adrenal mass. Noncontrast filled views of the urinary bladder with limited evaluation secondary to habitus/ artifact. It would be difficult on the present examination to exclude a primary bladder abnormality. Stomach/Bowel: Diverticulosis with regions of muscular hypertrophy. No extraluminal bowel inflammatory process. Limited for evaluating for possibility of bowel/ stomach primary mass secondary to under distension. No obvious abnormality noted. Vascular/Lymphatic: Retroperitoneal, upper abdominal, mesenteric, pelvic and right inguinal adenopathy. Index lymph node right external iliac region measures 5.1 x 3.1 cm. Right inguinal adenopathy which may be assessable to biopsy measures 2.7 x 2 x 2.7 cm. Trace abdominal aortic calcification without aneurysm. No large arterial vessel occlusion. Reproductive: Post hysterectomy.  No obvious adnexal mass. Other: No free intraperitoneal air. Injection granulomas and subcutaneous gas from injection. Musculoskeletal: No destructive lesion identified. Degenerative changes most prominent L4-5 level with facet degenerative changes, mild anterior slip L4 and bulge contribute to multifactorial spinal stenosis. 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 Question epidural extension of tumor versus engorged epidural veins. Contrast-enhanced MR can be obtained for further delineation of clinically desired. Abdominal region retroperitoneal upper abdominal, mesenteric, pelvic and right inguinal adenopathy. Index lymph node right external iliac region measures 5.1 x 3.1 cm. Right inguinal adenopathy which may be assessable to biopsy measures 2.7 x 2 x 2.7 cm. Partially loculated right-sided pleural effusion. Pleural extension of tumor posterior inferior right thorax (series  3, images 30 through 36). Confluent atelectasis with air bronchograms right lower lobe right middle lobe. Less confluent atelectasis inferior right upper lobe. No central obstructing mass identified. Tethered pleura with sub pleural thickening greatest lung base. Multiple tiny bilateral nodules throughout all lung zones. Findings may  reflect tumor such as small cell primary lung neoplasm in this smoker. Adenopathy from lymphoma is a possibility. Adenopathy and spread of tumor to lung from other primary tumor not excluded although other primary lesion not identified (evaluation of bowel limited by under distension without obvious mass). Significantly enlarged liver spanning over 25.6 cm. Aortic Atherosclerosis (ICD10-I70.0). Prominent ascending thoracic aorta measuring up to 4 cm transverse dimension. Recommend annual imaging followup by CTA or MRA. This recommendation follows 2010 ACCF/AHA/AATS/ACR/ASA/SCA/SCAI/SIR/STS/SVM Guidelines for the Diagnosis and Management of Patients with Thoracic Aortic Disease. Circulation. 2010; 121: H371-I967 Exam not tailored to evaluate for pulmonary embolus. No central pulmonary embolus noted. Main pulmonary artery prominent measuring 3.6 cm which may reflect component of pulmonary hypertension. Electronically Signed   By: Genia Del M.D.   On: 08/08/2016 07:45   Mr Thoracic Spine W Wo Contrast  Result Date: 08/10/2016 CLINICAL DATA:  66 year old female with mediastinal lymphadenopathy and shortness of breath. Status post thoracentesis 08/07/2016. Prevertebral soft tissue mass by recent chest CT. EXAM: MRI THORACIC WITHOUT AND WITH CONTRAST TECHNIQUE: Multiplanar and multiecho pulse sequences of the thoracic spine were obtained without and with intravenous contrast. CONTRAST:  20 ml MULTIHANCE GADOBENATE DIMEGLUMINE 529 MG/ML IV SOLN COMPARISON:  CT chest 08/09/2015. FINDINGS: MRI THORACIC SPINE FINDINGS Alignment:  Maintained. Vertebrae: No fracture. STIR imaging  demonstrates marrow edema in the T8 and T9 vertebral bodies with corresponding decreased T1 signal most consistent with tumor infiltration. More subtle decreased T1 signal is identified in the T7, T10, T11 and likely T12 vertebral bodies worrisome for tumor infiltration. Cord:  Demonstrates normal signal. Paraspinal and other soft tissues: Right pleural effusion is identified. Large prevertebral tumor mass extending along both the right and left sides of multiple vertebral bodies is identified as seen on prior CT scan. There is extension of tumor along the pleura bilaterally. Large right pleural effusion is noted. Disc levels: Multilevel degenerative disease of the cervical spine is seen in the sagittal plane only. Axial imaging is limited but there is epidural tumor extending from approximately T7 through T11 as seen on the sagittal postcontrast images. Tumor bulk is largest from mid T8 to upper T10 where it is eccentrically prominent to the right. Epidural tumor on the right and anteriorly from the T8-9 to the T9-10 levels compresses and deforms the cord. See axial images 14-21 of series 12. No associated cord signal abnormality is identified. Disc bulging is seen in the upper and mid cervical spine but does not cause central canal stenosis. IMPRESSION: Large paraspinal mass as seen on prior CT scan. Abnormal marrow signal from T7-T11 is most conspicuous in T8 and T9 and consistent with tumor. 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 Epidural tumor is best seen on axial images 14-21 of series 12. Right pleural effusion. Multilevel cervical and thoracic spondylosis. Electronically Signed   By: Inge Rise M.D.   On: 08/10/2016 11:27   Ct Abdomen Pelvis W Contrast  Result Date: 08/08/2016 CLINICAL DATA:  66 year old female with mediastinal adenopathy and shortness breath. Post thoracentesis. Cytology pending. Recently quit smoking.  Evaluate for metastatic disease. Post abdominal hysterectomy. Subsequent encounter. EXAM: CT CHEST, ABDOMEN, AND PELVIS WITH CONTRAST TECHNIQUE: Multidetector CT imaging of the chest, abdomen and pelvis was performed following the standard protocol during bolus administration of intravenous contrast. CONTRAST:  157mL ISOVUE-300 IOPAMIDOL (ISOVUE-300) INJECTION 61% COMPARISON:  08/07/2016 chest x-ray and chest CT. 05/24/2005 CT of the abdomen  and pelvis. FINDINGS: CT CHEST FINDINGS Cardiovascular: Heart size within normal limits. Prominent mitral valve calcifications. Trace coronary artery calcifications. Prominent ascending thoracic aorta measuring up to 4 cm transverse dimension. Exam not tailored to evaluate for pulmonary embolus. No central pulmonary embolus noted. Main pulmonary artery prominent measuring 3.6 cm which may reflect component of pulmonary hypertension. 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 Question epidural extension of tumor versus engorged epidural veins. Contrast-enhanced MR can be obtained for further delineation of clinically desired. Lungs/Pleura: Partially loculated right-sided pleural effusion. Pleural extension of tumor posterior inferior right thorax (series 3, images 30 through 36). Confluent atelectasis with air bronchograms right lower lobe right middle lobe. Less confluent atelectasis inferior right upper lobe. No central obstructing mass identified. Tethered pleura with sub pleural thickening greatest lung base. Multiple tiny bilateral nodules throughout all lung zones. Accessory fissure left upper lobe incidentally noted. Trachea and mainstem bronchi are patent. Musculoskeletal: Although thoracic vertebra surrounded by mass, no obvious destruction. Degenerative changes  throughout the lower cervical and thoracic spine. CT ABDOMEN PELVIS FINDINGS Hepatobiliary: Significantly enlarged liver spanning over 25.6 cm without focal hepatic lesion noted. No calcified gallstones. Pancreas: No primary pancreatic mass or inflammation. Spleen: No splenic mass or enlargement. Adrenals/Urinary Tract: No hydronephrosis. Left lower pole 1 cm low-density structure statistically likely a cyst. No adrenal mass. Noncontrast filled views of the urinary bladder with limited evaluation secondary to habitus/ artifact. It would be difficult on the present examination to exclude a primary bladder abnormality. Stomach/Bowel: Diverticulosis with regions of muscular hypertrophy. No extraluminal bowel inflammatory process. Limited for evaluating for possibility of bowel/ stomach primary mass secondary to under distension. No obvious abnormality noted. Vascular/Lymphatic: Retroperitoneal, upper abdominal, mesenteric, pelvic and right inguinal adenopathy. Index lymph node right external iliac region measures 5.1 x 3.1 cm. Right inguinal adenopathy which may be assessable to biopsy measures 2.7 x 2 x 2.7 cm. Trace abdominal aortic calcification without aneurysm. No large arterial vessel occlusion. Reproductive: Post hysterectomy.  No obvious adnexal mass. Other: No free intraperitoneal air. Injection granulomas and subcutaneous gas from injection. Musculoskeletal: No destructive lesion identified. Degenerative changes most prominent L4-5 level with facet degenerative changes, mild anterior slip L4 and bulge contribute to multifactorial spinal stenosis. 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 Question epidural extension of tumor versus engorged epidural veins. Contrast-enhanced MR can be obtained for further  delineation of clinically desired. Abdominal region retroperitoneal upper abdominal, mesenteric, pelvic and right inguinal adenopathy. Index lymph node right external iliac region measures 5.1 x 3.1 cm. Right inguinal adenopathy which may be assessable to biopsy measures 2.7 x 2 x 2.7 cm. Partially loculated right-sided pleural effusion. Pleural extension of tumor posterior inferior right thorax (series 3, images 30 through 36). Confluent atelectasis with air bronchograms right lower lobe right middle lobe. Less confluent atelectasis inferior right upper lobe. No central obstructing mass identified. Tethered pleura with sub pleural thickening greatest lung base. Multiple tiny bilateral nodules throughout all lung zones. Findings may reflect tumor such as small cell primary lung neoplasm in this smoker. Adenopathy from lymphoma is a possibility. Adenopathy and spread of tumor to lung from other primary tumor not excluded although other primary lesion not identified (evaluation of bowel  limited by under distension without obvious mass). Significantly enlarged liver spanning over 25.6 cm. Aortic Atherosclerosis (ICD10-I70.0). Prominent ascending thoracic aorta measuring up to 4 cm transverse dimension. Recommend annual imaging followup by CTA or MRA. This recommendation follows 2010 ACCF/AHA/AATS/ACR/ASA/SCA/SCAI/SIR/STS/SVM Guidelines for the Diagnosis and Management of Patients with Thoracic Aortic Disease. Circulation. 2010; 121: K160-F093 Exam not tailored to evaluate for pulmonary embolus. No central pulmonary embolus noted. Main pulmonary artery prominent measuring 3.6 cm which may reflect component of pulmonary hypertension. Electronically Signed   By: Genia Del M.D.   On: 08/08/2016 07:45   US Biopsy  Result Date: 08/09/2016 INDICATION: 66 year old female with newly diagnosed multifocal lymphadenopathy highly concerning for malignant lymphoma. EXAM: Ultrasound-guided core biopsy right inguinal lymph node  MEDICATIONS: None. ANESTHESIA/SEDATION: Moderate (conscious) sedation was employed during this procedure. A total of Versed 1.5 mg and Fentanyl 75 mcg was administered intravenously. Moderate Sedation Time: 13 minutes. The patient's level of consciousness and vital signs were monitored continuously by radiology nursing throughout the procedure under my direct supervision. FLUOROSCOPY TIME:  Fluoroscopy Time: 0 minutes 0 seconds (0 mGy). COMPLICATIONS: None immediate. PROCEDURE: Informed written consent was obtained from the patient after a thorough discussion of the procedural risks, benefits and alternatives. All questions were addressed. A timeout was performed prior to the initiation of the procedure. The right inguinal region was interrogated with ultrasound. A hypoechoic lymph node with internal vascularity measures 2.7 x 1.4 cm. A suitable skin entry site was selected and marked. Following sterile prep and drape in the standard fashion with chlorhexidine skin prep, local anesthesia was attained by infiltration with 1% lidocaine. A small dermatotomy was made. Using real-time sonographic guidance, multiple 16 gauge core biopsies were obtained using the Bard automated biopsy device. Biopsy specimens were placed in saline and delivered to pathology for further analysis. Post biopsy ultrasound imaging demonstrates no evidence of active bleeding or complication. IMPRESSION: Technically successful core biopsy of abnormal right inguinal lymph node. Signed, Criselda Peaches, MD [facility] Electronically Signed   By: Jacqulynn Cadet M.D.   On: 08/09/2016 13:12   Dg Chest Port 1 View  Result Date: 08/08/2016 CLINICAL DATA:  Respiratory failure EXAM: PORTABLE CHEST 1 VIEW COMPARISON:  08/07/2016 FINDINGS: Right pleural effusion and right lower lobe airspace disease unchanged. Left lung remains clear. Heart size normal. Negative for heart failure.  IMPRESSION: Right lower lobe airspace disease and right pleural effusion unchanged. Electronically Signed   By: Franchot Gallo M.D.   On: 08/08/2016 07:46   Ir Thoracentesis Asp Pleural Space W/img Guide  Result Date: 08/14/2016 INDICATION: Shortness of breath. Recurrent right pleural effusion. Request therapeutic thoracentesis. EXAM: ULTRASOUND GUIDED RIGHT THORACENTESIS MEDICATIONS: None. COMPLICATIONS: None immediate. Postprocedural chest x-ray negative for pneumothorax. PROCEDURE: An ultrasound guided thoracentesis was thoroughly discussed with the patient and questions answered. The benefits, risks, alternatives and complications were also discussed. The patient understands and wishes to proceed with the procedure. Written consent was obtained. Ultrasound was performed to localize and mark an adequate pocket of fluid in the right chest. The area was then prepped and draped in the normal sterile fashion. 1% Lidocaine was used for local anesthesia. Under ultrasound guidance a Safe-T-Centesis catheter was introduced. Thoracentesis was performed. The catheter was removed and a dressing applied. FINDINGS: A total of approximately 840 mL of clear, amber colored fluid was removed. IMPRESSION: Successful ultrasound guided right thoracentesis yielding 840 mL of pleural fluid. Read by: Ascencion Dike PA-C Electronically Signed  By: Markus Daft M.D.   On: 08/14/2016 11:30   Ir Thoracentesis Asp Pleural Space W/img Guide  Result Date: 08/07/2016 INDICATION: Patient admitted with cough, dyspnea, orthopnea. Imaging reveals a a prevertebral soft tissue abnormality consistent with a mass as well as bilateral nodular densities in the upper lobes of the lungs. A moderate right pleural effusion is noted as well. Request has been made for diagnostic and therapeutic thoracentesis. EXAM: ULTRASOUND GUIDED DIAGNOSTIC AND THERAPEUTIC THORACENTESIS MEDICATIONS: 1% xylocaine COMPLICATIONS: None immediate. PROCEDURE: An ultrasound  guided thoracentesis was thoroughly discussed with the patient and questions answered. The benefits, risks, alternatives and complications were also discussed. The patient understands and wishes to proceed with the procedure. Written consent was obtained. Ultrasound was performed to localize and mark an adequate pocket of fluid in the right chest. The area was then prepped and draped in the normal sterile fashion. 1% Lidocaine was used for local anesthesia. Under ultrasound guidance a Safe-T-Centesis catheter was introduced. Thoracentesis was performed. The catheter was removed and a dressing applied. FINDINGS: A total of approximately 0.7 L of serosanguineous fluid was removed. Samples were sent to the laboratory as requested by the clinical team. IMPRESSION: Successful ultrasound guided right thoracentesis yielding 0.7 L of pleural fluid. Postprocedure chest radiograph shows no pneumothorax. Read by: Saverio Danker, PA-C Electronically Signed   By: Lucrezia Europe M.D.   On: 08/07/2016 13:05    2-D echo Study Conclusions  - Left ventricle: The cavity size was normal. Wall thickness was   increased in a pattern of moderate LVH. Systolic function was   normal. The estimated ejection fraction was in the range of 60%   to 65%. Wall motion was normal; there were no regional wall   motion abnormalities. Doppler parameters are consistent with   abnormal left ventricular relaxation (grade 1 diastolic   dysfunction). The E/e&' ratio is between 8-15, suggesting   indeterminate LV filling pressure. - Mitral valve: Mildly thickened leaflets . There was trivial   regurgitation. - Left atrium: The atrium was normal in size. - Tricuspid valve: There was trivial regurgitation. - Pulmonary arteries: PA peak pressure: 27 mm Hg (S). - Inferior vena cava: The vessel was normal in size. The   respirophasic diameter changes were in the normal range (>= 50%),   consistent with normal central venous  pressure.  Impressions:  - LVEF 60-65%, moderate LVH, normal wall motion, grade 1 DD,   indeterminate LV filling pressure, trivial MR, normal LA size,   trivial TR, RVSP 27 mmHg, normal IVC.   Subjective: Right upper quadrant/back pain better after thoracentesis yesterday and starting Decadron.  Discharge Exam: Vitals:   08/14/16 2140 08/15/16 0537  BP: (!) 146/75 128/71  Pulse: 65 63  Resp: 18 16  Temp: 98.4 F (36.9 C) 98.1 F (36.7 C)  SpO2: 91% 96%   Vitals:   08/14/16 1205 08/14/16 1434 08/14/16 2140 08/15/16 0537  BP:  120/73 (!) 146/75 128/71  Pulse:  66 65 63  Resp:   18 16  Temp:  98.3 F (36.8 C) 98.4 F (36.9 C) 98.1 F (36.7 C)  TempSrc:  Oral Oral Oral  SpO2: 94% 97% 91% 96%  Weight:      Height:        General: Obese female not in distress HEENT: Moist mucosa, supple neck Chest: Improved breath sounds over right lung base CVS: Normal S1 and S2, no murmurs GI: Soft, nondistended, nontender Musculoskeletal: Warm, no edema, minimal right mid/upper back tenderness  The results of significant diagnostics from this hospitalization (including imaging, microbiology, ancillary and laboratory) are listed below for reference.     Microbiology: Recent Results (from the past 240 hour(s))  Culture, blood (Routine X 2) w Reflex to ID Panel     Status: None   Collection Time: 08/07/16 12:25 AM  Result Value Ref Range Status   Specimen Description BLOOD RIGHT ANTECUBITAL  Final   Special Requests   Final    BOTTLES DRAWN AEROBIC AND ANAEROBIC Blood Culture adequate volume   Culture NO GROWTH 5 DAYS  Final   Report Status 08/12/2016 FINAL  Final  Culture, blood (Routine X 2) w Reflex to ID Panel     Status: None   Collection Time: 08/07/16 12:45 AM  Result Value Ref Range Status   Specimen Description BLOOD LEFT ANTECUBITAL  Final   Special Requests   Final    BOTTLES DRAWN AEROBIC ONLY Blood Culture adequate volume   Culture NO GROWTH 5 DAYS   Final   Report Status 08/12/2016 FINAL  Final  Gram stain     Status: None   Collection Time: 08/07/16 12:36 PM  Result Value Ref Range Status   Specimen Description PLEURAL RIGHT  Final   Special Requests NONE  Final   Gram Stain   Final    ABUNDANT WBC PRESENT,BOTH PMN AND MONONUCLEAR NO ORGANISMS SEEN    Report Status 08/07/2016 FINAL  Final  Acid Fast Smear (AFB)     Status: None   Collection Time: 08/07/16  1:02 PM  Result Value Ref Range Status   AFB Specimen Processing Concentration  Final   Acid Fast Smear Negative  Final    Comment: (NOTE) Performed At: [facility] [place], [place] [id number] Lindon Romp MD WU:[id number]    Source (AFB) PLEURAL  Final    Comment: RIGHT  Culture, body fluid-bottle     Status: None   Collection Time: 08/07/16  1:02 PM  Result Value Ref Range Status   Specimen Description PLEURAL RIGHT  Final   Special Requests BOTTLES DRAWN AEROBIC AND ANAEROBIC  Final   Culture NO GROWTH 5 DAYS  Final   Report Status 08/12/2016 FINAL  Final     Labs: BNP (last 3 results)  Recent Labs  08/07/16 0023  BNP 95.6   Basic Metabolic Panel:  Recent Labs Lab 08/09/16 0535 08/13/16 0528  NA 139 137  K 3.8 4.1  CL 108 104  CO2 24 28  GLUCOSE 97 98  BUN 13 11  CREATININE 0.83 0.86  CALCIUM 8.4* 8.6*  MG 2.0  --    Liver Function Tests:  Recent Labs Lab 08/09/16 0535 08/13/16 0528  AST 17 26  ALT 15 23  ALKPHOS 43 48  BILITOT 0.4 0.5  PROT 5.9* 6.3*  ALBUMIN 2.6* 2.7*   No results for input(s): LIPASE, AMYLASE in the last 168 hours. No results for input(s): AMMONIA in the last 168 hours. CBC:  Recent Labs Lab 08/13/16 0528  WBC 6.6  HGB 10.6*  HCT 33.3*  MCV 84.3  PLT 255   Cardiac Enzymes: No results for input(s): CKTOTAL, CKMB, CKMBINDEX, TROPONINI in the last 168 hours. BNP: Invalid input(s): POCBNP CBG: No results for input(s): GLUCAP in the last 168 hours. D-Dimer No results for  input(s): DDIMER in the last 72 hours. Hgb A1c No results for input(s): HGBA1C in the last 72 hours. Lipid Profile No results for input(s): CHOL, HDL, LDLCALC, TRIG, CHOLHDL, LDLDIRECT  in the last 72 hours. Thyroid function studies No results for input(s): TSH, T4TOTAL, T3FREE, THYROIDAB in the last 72 hours.  Invalid input(s): FREET3 Anemia work up  Recent Labs  08/13/16 0937  VITAMINB12 249  TIBC 227*  IRON 36   Urinalysis No results found for: COLORURINE, APPEARANCEUR, LABSPEC, PHURINE, GLUCOSEU, HGBUR, BILIRUBINUR, [place], PROTEINUR, UROBILINOGEN, NITRITE, LEUKOCYTESUR Sepsis Labs Invalid input(s): PROCALCITONIN,  WBC,  LACTICIDVEN Microbiology Recent Results (from the past 240 hour(s))  Culture, blood (Routine X 2) w Reflex to ID Panel     Status: None   Collection Time: 08/07/16 12:25 AM  Result Value Ref Range Status   Specimen Description BLOOD RIGHT ANTECUBITAL  Final   Special Requests   Final    BOTTLES DRAWN AEROBIC AND ANAEROBIC Blood Culture adequate volume   Culture NO GROWTH 5 DAYS  Final   Report Status 08/12/2016 FINAL  Final  Culture, blood (Routine X 2) w Reflex to ID Panel     Status: None   Collection Time: 08/07/16 12:45 AM  Result Value Ref Range Status   Specimen Description BLOOD LEFT ANTECUBITAL  Final   Special Requests   Final    BOTTLES DRAWN AEROBIC ONLY Blood Culture adequate volume   Culture NO GROWTH 5 DAYS  Final   Report Status 08/12/2016 FINAL  Final  Gram stain     Status: None   Collection Time: 08/07/16 12:36 PM  Result Value Ref Range Status   Specimen Description PLEURAL RIGHT  Final   Special Requests NONE  Final   Gram Stain   Final    ABUNDANT WBC PRESENT,BOTH PMN AND MONONUCLEAR NO ORGANISMS SEEN    Report Status 08/07/2016 FINAL  Final  Acid Fast Smear (AFB)     Status: None   Collection Time: 08/07/16  1:02 PM  Result Value Ref Range Status   AFB Specimen Processing Concentration  Final   Acid Fast Smear Negative   Final    Comment: (NOTE) Performed At: [facility] [address] [place], [place] [id number] Lindon Romp MD WH:[id number]    Source (AFB) PLEURAL  Final    Comment: RIGHT  Culture, body fluid-bottle     Status: None   Collection Time: 08/07/16  1:02 PM  Result Value Ref Range Status   Specimen Description PLEURAL RIGHT  Final   Special Requests BOTTLES DRAWN AEROBIC AND ANAEROBIC  Final   Culture NO GROWTH 5 DAYS  Final   Report Status 08/12/2016 FINAL  Final     Time coordinating discharge: Over 30 minutes  SIGNED:   Louellen Molder, MD  [facility] 08/15/2016, 11:16 AM Pager   If 7PM-7AM, please contact night-coverage [email or web address] Password [email or web address]

## 2016-08-16 ENCOUNTER — Telehealth: Payer: Self-pay

## 2016-08-16 ENCOUNTER — Other Ambulatory Visit: Payer: Self-pay | Admitting: Radiology

## 2016-08-16 ENCOUNTER — Telehealth: Payer: Self-pay | Admitting: Hematology

## 2016-08-16 NOTE — Telephone Encounter (Signed)
sw pt to confrim 8/21 appt at 0800 per sch msg

## 2016-08-16 NOTE — Telephone Encounter (Signed)
PET Scan scheduled for Saturday 08/19/16 at 0830. Nothing by mouth after midnight Friday evening. Spoke with niece over the phone who stated she would relay information to pt and give [place] a call back if there are any conflicts. Additionally reinforced Chemo Education scheduled for tomorrow (8/16) at 1230 and to come in at the [facility] entrance.

## 2016-08-16 NOTE — Telephone Encounter (Signed)
Pt okay to come tomorrow for chemo class at 1230. Unable to come Friday because class will run into procedure prep time. Scheduling message sent high priority. Staff message sent for PET prior auth. Completion confirmation requested.

## 2016-08-17 ENCOUNTER — Telehealth: Payer: Self-pay

## 2016-08-17 ENCOUNTER — Other Ambulatory Visit: Payer: Self-pay | Admitting: Hematology

## 2016-08-17 ENCOUNTER — Encounter: Payer: Self-pay | Admitting: *Deleted

## 2016-08-17 ENCOUNTER — Other Ambulatory Visit: Payer: Medicare Other

## 2016-08-17 ENCOUNTER — Other Ambulatory Visit: Payer: Self-pay | Admitting: General Surgery

## 2016-08-17 DIAGNOSIS — C8330 Diffuse large B-cell lymphoma, unspecified site: Secondary | ICD-10-CM | POA: Insufficient documentation

## 2016-08-17 DIAGNOSIS — C8338 Diffuse large B-cell lymphoma, lymph nodes of multiple sites: Secondary | ICD-10-CM

## 2016-08-17 HISTORY — DX: Diffuse large B-cell lymphoma, unspecified site: C83.30

## 2016-08-17 MED ORDER — PREDNISONE 20 MG PO TABS: 60.0000 mg | ORAL_TABLET | Freq: Every day | ORAL | 5 refills | Status: DC

## 2016-08-17 MED ORDER — ALLOPURINOL 100 MG PO TABS: 100.0000 mg | ORAL_TABLET | Freq: Two times a day (BID) | ORAL | 0 refills | Status: DC

## 2016-08-17 MED ORDER — PROCHLORPERAZINE MALEATE 10 MG PO TABS: 10.0000 mg | ORAL_TABLET | Freq: Four times a day (QID) | ORAL | 6 refills | Status: DC | PRN

## 2016-08-17 MED ORDER — ONDANSETRON HCL 8 MG PO TABS: 8.0000 mg | ORAL_TABLET | Freq: Two times a day (BID) | ORAL | 1 refills | Status: DC | PRN

## 2016-08-17 MED ORDER — LIDOCAINE-PRILOCAINE 2.5-2.5 % EX CREA: 1.0000 "application " | TOPICAL_CREAM | CUTANEOUS | 6 refills | Status: DC | PRN

## 2016-08-17 NOTE — Progress Notes (Signed)
START ON PATHWAY REGIMEN - Lymphoma and CLL     A cycle is every 21 days:     Rituximab      Cyclophosphamide      Doxorubicin      Vincristine      Prednisone   **Always confirm dose/schedule in your pharmacy ordering system**    Patient Characteristics: Diffuse Large B Cell Lymphoma, First Line, Stage III and IV Disease Type: Not Applicable Disease Type: Diffuse Large B Cell Line of therapy: First Line [place] Stage: III Intent of Therapy: Curative Intent, Discussed with Patient 

## 2016-08-17 NOTE — Telephone Encounter (Signed)
Notified by Ursula Beath, RN that home medications not yet sent to pharmacy for pt. Discussed with Dr. Irene Limbo. Orders and prescriptions being placed for start on 8/21.

## 2016-08-18 ENCOUNTER — Ambulatory Visit (HOSPITAL_COMMUNITY)
Admission: RE | Admit: 2016-08-18 | Discharge: 2016-08-18 | Disposition: A | Discharge status: Home / Self Care | Payer: Medicare Other | Source: Ambulatory Visit | Attending: Interventional Radiology | Admitting: Interventional Radiology

## 2016-08-18 ENCOUNTER — Encounter (HOSPITAL_COMMUNITY): Payer: Self-pay

## 2016-08-18 DIAGNOSIS — Z79899 Other long term (current) drug therapy: Secondary | ICD-10-CM | POA: Diagnosis not present

## 2016-08-18 DIAGNOSIS — K219 Gastro-esophageal reflux disease without esophagitis: Secondary | ICD-10-CM | POA: Diagnosis not present

## 2016-08-18 DIAGNOSIS — C8510 Unspecified B-cell lymphoma, unspecified site: Secondary | ICD-10-CM | POA: Diagnosis present

## 2016-08-18 DIAGNOSIS — C8590 Non-Hodgkin lymphoma, unspecified, unspecified site: Secondary | ICD-10-CM

## 2016-08-18 DIAGNOSIS — Z87891 Personal history of nicotine dependence: Secondary | ICD-10-CM | POA: Diagnosis not present

## 2016-08-18 HISTORY — PX: IR FLUORO GUIDE PORT INSERTION RIGHT: IMG5741

## 2016-08-18 HISTORY — PX: IR US GUIDE VASC ACCESS RIGHT: IMG2390

## 2016-08-18 LAB — CBC
HCT: 35 % — ABNORMAL LOW (ref 36.0–46.0)
Hemoglobin: 11 g/dL — ABNORMAL LOW (ref 12.0–15.0)
MCH: 26.8 pg (ref 26.0–34.0)
MCHC: 31.4 g/dL (ref 30.0–36.0)
MCV: 85.4 fL (ref 78.0–100.0)
PLATELETS: 250 10*3/uL (ref 150–400)
RBC: 4.1 MIL/uL (ref 3.87–5.11)
RDW: 13.9 % (ref 11.5–15.5)
WBC: 8.9 10*3/uL (ref 4.0–10.5)

## 2016-08-18 LAB — BASIC METABOLIC PANEL
Anion gap: 8 (ref 5–15)
BUN: 14 mg/dL (ref 6–20)
CALCIUM: 8.6 mg/dL — AB (ref 8.9–10.3)
CHLORIDE: 105 mmol/L (ref 101–111)
CO2: 30 mmol/L (ref 22–32)
Creatinine, Ser: 1.07 mg/dL — ABNORMAL HIGH (ref 0.44–1.00)
GFR calc Af Amer: >60 mL/min (ref >60)
GFR calc non Af Amer: 53 mL/min — ABNORMAL LOW (ref >60)
Glucose, Bld: 95 mg/dL (ref 65–99)
Potassium: 3.4 mmol/L — ABNORMAL LOW (ref 3.5–5.1)
Sodium: 143 mmol/L (ref 135–145)

## 2016-08-18 LAB — APTT: aPTT: 28 seconds (ref 24–36)

## 2016-08-18 LAB — PROTIME-INR
INR: 1.16
Prothrombin Time: 14.9 seconds (ref 11.4–15.2)

## 2016-08-18 MED ORDER — LIDOCAINE HCL (PF) 1 % IJ SOLN
INTRAMUSCULAR | Status: AC | PRN
  Administered 2016-08-18: 20 mL

## 2016-08-18 MED ORDER — HEPARIN SOD (PORK) LOCK FLUSH 100 UNIT/ML IV SOLN
INTRAVENOUS | Status: AC | PRN
  Administered 2016-08-18: 500 [IU] via INTRAVENOUS

## 2016-08-18 MED ORDER — SODIUM CHLORIDE 0.9 % IV SOLN: INTRAVENOUS | Status: DC

## 2016-08-18 MED ORDER — SODIUM CHLORIDE 0.9 % IV SOLN
INTRAVENOUS | Status: AC | PRN
  Administered 2016-08-18: 10 mL/h via INTRAVENOUS

## 2016-08-18 MED ORDER — LIDOCAINE HCL (PF) 1 % IJ SOLN
INTRAMUSCULAR | Status: AC
  Filled 2016-08-18: qty 30

## 2016-08-18 MED ORDER — MIDAZOLAM HCL 2 MG/2ML IJ SOLN
INTRAMUSCULAR | Status: AC
  Filled 2016-08-18: qty 2

## 2016-08-18 MED ORDER — MIDAZOLAM HCL 2 MG/2ML IJ SOLN
INTRAMUSCULAR | Status: AC | PRN
  Administered 2016-08-18: 0.5 mg via INTRAVENOUS
  Administered 2016-08-18: 1 mg via INTRAVENOUS
  Administered 2016-08-18: 0.5 mg via INTRAVENOUS

## 2016-08-18 MED ORDER — FENTANYL CITRATE (PF) 100 MCG/2ML IJ SOLN
INTRAMUSCULAR | Status: AC | PRN
  Administered 2016-08-18 (×2): 25 ug via INTRAVENOUS

## 2016-08-18 MED ORDER — CEFAZOLIN SODIUM-DEXTROSE 2-4 GM/100ML-% IV SOLN
INTRAVENOUS | Status: AC
  Filled 2016-08-18: qty 100

## 2016-08-18 MED ORDER — HEPARIN SOD (PORK) LOCK FLUSH 100 UNIT/ML IV SOLN
INTRAVENOUS | Status: AC
  Filled 2016-08-18: qty 5

## 2016-08-18 MED ORDER — CEFAZOLIN SODIUM-DEXTROSE 2-4 GM/100ML-% IV SOLN
2.0000 g | INTRAVENOUS | Status: AC
  Administered 2016-08-18: 2 g via INTRAVENOUS

## 2016-08-18 MED ORDER — FENTANYL CITRATE (PF) 100 MCG/2ML IJ SOLN
INTRAMUSCULAR | Status: AC
  Filled 2016-08-18: qty 2

## 2016-08-18 NOTE — Discharge Instructions (Addendum)
Implanted Port Insertion, Care After °This sheet gives you information about how to care for yourself after your procedure. Your health care provider may also give you more specific instructions. If you have problems or questions, contact your health care provider. °What can I expect after the procedure? °After your procedure, it is common to have: °· Discomfort at the port insertion site. °· Bruising on the skin over the port. This should improve over 3-4 days. ° °Follow these instructions at home: °Port care °· After your port is placed, you will get a manufacturer's information card. The card has information about your port. Keep this card with you at all times. °· Take care of the port as told by your health care provider. Ask your health care provider if you or a family member can get training for taking care of the port at home. A home health care nurse may also take care of the port. °· Make sure to remember what type of port you have. °Incision care °· Follow instructions from your health care provider about how to take care of your port insertion site. Make sure you: °? Wash your hands with soap and water before you change your bandage (dressing). If soap and water are not available, use hand sanitizer. °? Change your dressing as told by your health care provider. °? Leave stitches (sutures), skin glue, or adhesive strips in place. These skin closures may need to stay in place for 2 weeks or longer. If adhesive strip edges start to loosen and curl up, you may trim the loose edges. Do not remove adhesive strips completely unless your health care provider tells you to do that. °· Check your port insertion site every day for signs of infection. Check for: °? More redness, swelling, or pain. °? More fluid or blood. °? Warmth. °? Pus or a bad smell. °General instructions °· Do not take baths, swim, or use a hot tub until your health care provider approves. °· Do not lift anything that is heavier than 10 lb (4.5  kg) for a week, or as told by your health care provider. °· Ask your health care provider when it is okay to: °? Return to work or school. °? Resume usual physical activities or sports. °· Do not drive for 24 hours if you were given a medicine to help you relax (sedative). °· Take over-the-counter and prescription medicines only as told by your health care provider. °· Wear a medical alert bracelet in case of an emergency. This will tell any health care providers that you have a port. °· Keep all follow-up visits as told by your health care provider. This is important. °Contact a health care provider if: °· You cannot flush your port with saline as directed, or you cannot draw blood from the port. °· You have a fever or chills. °· You have more redness, swelling, or pain around your port insertion site. °· You have more fluid or blood coming from your port insertion site. °· Your port insertion site feels warm to the touch. °· You have pus or a bad smell coming from the port insertion site. °Get help right away if: °· You have chest pain or shortness of breath. °· You have bleeding from your port that you cannot control. °Summary °· Take care of the port as told by your health care provider. °· Change your dressing as told by your health care provider. °· Keep all follow-up visits as told by your health care provider. °  This information is not intended to replace advice given to you by your health care provider. Make sure you discuss any questions you have with your health care provider. °Document Released: 10/09/2012 Document Revised: 11/10/2015 Document Reviewed: 11/10/2015 °[employer]vier Interactive Patient Education © 2017 [employer] °Implanted Port Home Guide °An implanted port is a type of central line that is placed under the skin. Central lines are used to provide IV access when treatment or nutrition needs to be given through a person’s veins. Implanted ports are used for long-term IV access. An implanted port  may be placed because: °· You need IV medicine that would be irritating to the small veins in your hands or arms. °· You need long-term IV medicines, such as antibiotics. °· You need IV nutrition for a long period. °· You need frequent blood draws for lab tests. °· You need dialysis. ° °Implanted ports are usually placed in the chest area, but they can also be placed in the upper arm, the abdomen, or the leg. An implanted port has two main parts: °· Reservoir. The reservoir is round and will appear as a small, raised area under your skin. The reservoir is the part where a needle is inserted to give medicines or draw blood. °· Catheter. The catheter is a thin, flexible tube that extends from the reservoir. The catheter is placed into a large vein. Medicine that is inserted into the reservoir goes into the catheter and then into the vein. ° °How will I care for my incision site? °Do not get the incision site wet. Bathe or shower as directed by your health care provider. °How is my port accessed? °Special steps must be taken to access the port: °· Before the port is accessed, a numbing cream can be placed on the skin. This helps numb the skin over the port site. °· Your health care provider uses a sterile technique to access the port. °? Your health care provider must put on a mask and sterile gloves. °? The skin over your port is cleaned carefully with an antiseptic and allowed to dry. °? The port is gently pinched between sterile gloves, and a needle is inserted into the port. °· Only "non-coring" port needles should be used to access the port. Once the port is accessed, a blood return should be checked. This helps ensure that the port is in the vein and is not clogged. °· If your port needs to remain accessed for a constant infusion, a clear (transparent) bandage will be placed over the needle site. The bandage and needle will need to be changed every week, or as directed by your health care provider. °· Keep the  bandage covering the needle clean and dry. Do not get it wet. Follow your health care provider’s instructions on how to take a shower or bath while the port is accessed. °· If your port does not need to stay accessed, no bandage is needed over the port. ° °What is flushing? °Flushing helps keep the port from getting clogged. Follow your health care provider’s instructions on how and when to flush the port. Ports are usually flushed with saline solution or a medicine called heparin. The need for flushing will depend on how the port is used. °· If the port is used for intermittent medicines or blood draws, the port will need to be flushed: °? After medicines have been given. °? After blood has been drawn. °? As part of routine maintenance. °· If a constant infusion is   running, the port may not need to be flushed. ° °How long will my port stay implanted? °The port can stay in for as long as your health care provider thinks it is needed. When it is time for the port to come out, surgery will be done to remove it. The procedure is similar to the one performed when the port was put in. °When should I seek immediate medical care? °When you have an implanted port, you should seek immediate medical care if: °· You notice a bad smell coming from the incision site. °· You have swelling, redness, or drainage at the incision site. °· You have more swelling or pain at the port site or the surrounding area. °· You have a fever that is not controlled with medicine. ° °This information is not intended to replace advice given to you by your health care provider. Make sure you discuss any questions you have with your health care provider. °Document Released: 12/19/2004 Document Revised: 05/27/2015 Document Reviewed: 08/26/2012 °[employer]vier Interactive Patient Education © 2017 [employer] °Moderate Conscious Sedation, Adult, Care After °These instructions provide you with information about caring for yourself after your procedure. Your  health care provider may also give you more specific instructions. Your treatment has been planned according to current medical practices, but problems sometimes occur. Call your health care provider if you have any problems or questions after your procedure. °What can I expect after the procedure? °After your procedure, it is common: °· To feel sleepy for several hours. °· To feel clumsy and have poor balance for several hours. °· To have poor judgment for several hours. °· To vomit if you eat too soon. ° °Follow these instructions at home: °For at least 24 hours after the procedure: ° °· Do not: °? Participate in activities where you could fall or become injured. °? Drive. °? Use heavy machinery. °? Drink alcohol. °? Take sleeping pills or medicines that cause drowsiness. °? Make important decisions or sign legal documents. °? Take care of children on your own. °· Rest. °Eating and drinking °· Follow the diet recommended by your health care provider. °· If you vomit: °? Drink water, juice, or soup when you can drink without vomiting. °? Make sure you have little or no nausea before eating solid foods. °General instructions °· Have a responsible adult stay with you until you are awake and alert. °· Take over-the-counter and prescription medicines only as told by your health care provider. °· If you smoke, do not smoke without supervision. °· Keep all follow-up visits as told by your health care provider. This is important. °Contact a health care provider if: °· You keep feeling nauseous or you keep vomiting. °· You feel light-headed. °· You develop a rash. °· You have a fever. °Get help right away if: °· You have trouble breathing. °This information is not intended to replace advice given to you by your health care provider. Make sure you discuss any questions you have with your health care provider. °Document Released: 10/09/2012 Document Revised: 05/24/2015 Document Reviewed: 04/10/2015 °[employer] Interactive  Patient Education © 2018 [employer] ° °

## 2016-08-18 NOTE — Procedures (Signed)
Interventional Radiology Procedure Note  Procedure: Placement of a right IJ approach single lumen PowerPort.  Tip is positioned at the superior cavoatrial junction and catheter is ready for immediate use.  Complications: None Recommendations:  - Ok to shower tomorrow - Do not submerge for 7 days - Routine line care  - DC at [facility],  Dulcy Fanny. Earleen Newport, DO

## 2016-08-18 NOTE — H&P (Signed)
Chief Complaint: Patient was seen in consultation today for [facility] a cath placement at the request of Brunetta Genera  Referring Physician(s): Brunetta Genera  Supervising Physician: Corrie Mckusick  Patient Status: [facility] - Out-pt  History of Present Illness: Jo Collins is a 66 y.o. female   New dx Large B Cell Lymphoma Tissue-Flow Cytometry - MONOCLONAL B CELL POPULATION IDENTIFIED. - SEE NOTE. 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  To start chemotherapy soon Request for [facility] per Dr Irene Limbo    Past Medical History:  Diagnosis Date  . GERD (gastroesophageal reflux disease)   . NHL (non-Hodgkin's lymphoma) [facility])     Past Surgical History:  Procedure Laterality Date  . ABDOMINAL HYSTERECTOMY    . IR THORACENTESIS ASP PLEURAL SPACE W/IMG GUIDE  08/07/2016  . IR THORACENTESIS ASP PLEURAL SPACE W/IMG GUIDE  08/14/2016    Allergies: Patient has no known allergies.  Medications: Prior to Admission medications   Medication Sig Start Date End Date Taking? Authorizing Provider  acetaminophen (TYLENOL) 500 MG tablet Take 1,000 mg by mouth every 6 (six) hours as needed for moderate pain.    [provider]  albuterol (PROVENTIL HFA;VENTOLIN HFA) 108 (90 Base) MCG/ACT inhaler Inhale 2 puffs into the lungs every 6 (six) hours as needed for wheezing or shortness of breath.    [provider]  allopurinol (ZYLOPRIM) 100 MG tablet Take 1 tablet (100 mg total) by mouth 2 (two) times daily. 08/17/16   Brunetta Genera, MD  dexamethasone (DECADRON) 4 MG tablet Take 2 tablets (8 mg total) by mouth daily. 08/16/16   Dhungel, Nishant, MD  lidocaine (LIDODERM) 5 % Place 1 patch onto the skin daily. Remove & Discard patch within 12 hours or as directed by MD 08/15/16   Dhungel, Flonnie Overman, MD  lidocaine-prilocaine (EMLA) cream Apply 1 application topically as needed. Apply to port-a-cath assess site 45-60 mins prior to  chemotherapy. 08/17/16   Brunetta Genera, MD  magnesium oxide (MAG-OX) 400 MG tablet Take 400 mg by mouth daily.    [provider]  Multiple Vitamin (MULTIVITAMIN WITH MINERALS) TABS tablet Take 1 tablet by mouth daily.    [provider]  ondansetron (ZOFRAN) 8 MG tablet Take 1 tablet (8 mg total) by mouth 2 (two) times daily as needed for refractory nausea / vomiting. Start on day 3 after cyclophosphamide rx 08/17/16   Brunetta Genera, MD  oxyCODONE (OXYCONTIN) 10 mg 12 hr tablet Take 1 tablet (10 mg total) by mouth every 12 (twelve) hours. 08/15/16   Dhungel, Flonnie Overman, MD  oxyCODONE-acetaminophen (PERCOCET/ROXICET) 5-325 MG tablet Take 2 tablets by mouth every 4 (four) hours as needed for moderate pain. 08/15/16   Dhungel, Nishant, MD  polyethylene glycol (MIRALAX / GLYCOLAX) packet Take 17 g by mouth daily. 08/15/16   Dhungel, Nishant, MD  predniSONE (DELTASONE) 20 MG tablet Take 3 tablets (60 mg total) by mouth daily. Take on days 1-5 of chemotherapy. 08/17/16   Brunetta Genera, MD  prochlorperazine (COMPAZINE) 10 MG tablet Take 1 tablet (10 mg total) by mouth every 6 (six) hours as needed (Nausea or vomiting). 08/17/16   Brunetta Genera, MD  vitamin B-12 500 MCG tablet Take 1 tablet (500 mcg total) by mouth daily. 08/16/16   Dhungel, Flonnie Overman, MD     Family History  Problem Relation Age of Onset  . COPD Mother   . Diabetes Mellitus II Sister   . Diabetes Mellitus II Brother   .  Diabetes Mellitus II Sister   . Stroke Sister   . Hypertension Sister   . Diabetes Mellitus II Sister     Social History   Social History  . Marital status: Married    Spouse name: N/A  . Number of children: N/A  . Years of education: N/A   Social History Main Topics  . Smoking status: Former Smoker    Packs/day: 0.50    Years: 47.00    Types: Cigarettes    Quit date: 07/28/2016  . Smokeless tobacco: Never Used  . Alcohol use No  . Drug use: Unknown  . Sexual activity:  Not Asked   Other Topics Concern  . None   Social History Narrative  . None    Review of Systems: A 12 point ROS discussed and pertinent positives are indicated in the HPI above.  All other systems are negative.  Review of Systems  Constitutional: Positive for activity change and fatigue. Negative for appetite change and fever.  Respiratory: Positive for shortness of breath.   Cardiovascular: Positive for chest pain.  Gastrointestinal: Negative for abdominal pain.  Musculoskeletal: Negative for back pain.  Neurological: Positive for weakness.  Psychiatric/Behavioral: Negative for behavioral problems and confusion.    Vital Signs: BP (!) 178/92   Pulse (!) 56   Temp 98.5 F (36.9 C)   Resp 18   Ht 5\' 1"  (1.549 m)   Wt 290 lb (131.5 kg)   SpO2 99%   BMI 54.80 kg/m   Physical Exam  Constitutional: She is oriented to person, place, and time.  Cardiovascular: Normal rate, regular rhythm and normal heart sounds.   Pulmonary/Chest: Effort normal.  Abdominal: Soft. Bowel sounds are normal.  Musculoskeletal: Normal range of motion.  Neurological: She is alert and oriented to person, place, and time.  Skin: Skin is warm and dry.  Psychiatric: She has a normal mood and affect. Her behavior is normal. Judgment and thought content normal.  Nursing note and vitals reviewed.   Mallampati Score:  MD Evaluation Airway: WNL Heart: WNL Abdomen: WNL Chest/ Lungs: WNL ASA  Classification: 3 Mallampati/Airway Score: One  Imaging: Dg Chest 1 View  Result Date: 08/14/2016 CLINICAL DATA:  Status post right-sided thoracentesis EXAM: CHEST 1 VIEW COMPARISON:  Chest x-ray dated August 13, 2016 FINDINGS: The left lung is adequately inflated. On the right there is improved aeration at the lung base since thoracentesis. The volume of pleural fluid has decreased somewhat. No postprocedure pneumothorax is observed. The cardiac silhouette remains enlarged. The central pulmonary vascularity is  prominent but may be accentuated by patient positioning. IMPRESSION: No postprocedure pneumothorax following right-sided thoracentesis. Some improved aeration of the right lung is seen. Electronically Signed   By: David  Martinique M.D.   On: 08/14/2016 11:20   Dg Chest 1 View  Result Date: 08/07/2016 CLINICAL DATA:  Status post right thoracentesis. EXAM: CHEST 1 VIEW COMPARISON:  08/06/2016 chest radiograph. FINDINGS: Stable cardiomediastinal silhouette with normal heart size. No pneumothorax. Small right pleural effusion, decreased. No left pleural effusion. No pulmonary edema. Patchy right lung base opacity is mildly decreased. No new consolidative airspace disease. IMPRESSION: 1. No pneumothorax. 2. Small right pleural effusion, decreased . 3. Nonspecific patchy right lung base opacity, decreased. Electronically Signed   By: Ilona Sorrel M.D.   On: 08/07/2016 13:23   Dg Chest 2 View  Result Date: 08/13/2016 CLINICAL DATA:  Right-sided pleural effusion. EXAM: CHEST  2 VIEW COMPARISON:  MRI of the thoracic spine 08/10/2016. Chest x-ray  and CT 08/08/16. FINDINGS: Heart size normal. A right paraspinal mass is again noted. A right pleural effusion is increasing. No significant consolidation is present on the left. IMPRESSION: 1. Increasing right-sided pleural effusion and airspace opacity. 2. Right paraspinal mass. Electronically Signed   By: San Morelle M.D.   On: 08/13/2016 14:35   Dg Chest 2 View  Result Date: 08/06/2016 CLINICAL DATA:  Acute onset of shortness of breath and lower back pain. Initial encounter. EXAM: CHEST  2 VIEW COMPARISON:  Chest radiograph performed 07/31/2016 FINDINGS: A small-to-moderate right-sided pleural effusion is noted, with right basilar airspace opacification, concerning for pneumonia. No pneumothorax is seen. The left lung appears clear. The heart is borderline normal in size. No acute osseous abnormalities are identified. IMPRESSION: Small to moderate right-sided  pleural effusion, with right basilar airspace opacification, concerning for pneumonia. Electronically Signed   By: Garald Balding M.D.   On: 08/06/2016 23:15   Dg Chest 2 View  Result Date: 07/31/2016 CLINICAL DATA:  Three days of nonproductive cough associated with shortness of breath and wheezing. Current smoker. EXAM: CHEST  2 VIEW COMPARISON:  Report of a chest x-ray of May 28, 2001. FINDINGS: The left lung is clear. On the right there is volume loss with obscuration of the hemidiaphragm. There is fluid in the minor fissure. There is no mediastinal shift. The heart and pulmonary vascularity are normal. There is mild tortuosity of the ascending thoracic aorta. The bony thorax exhibits no acute abnormality. IMPRESSION: Atelectasis or pneumonia with right pleural E fusion. Followup PA and lateral chest X-ray is recommended in 3-4 weeks following trial of antibiotic therapy to ensure resolution and exclude underlying malignancy. Electronically Signed   By: David  Martinique M.D.   On: 07/31/2016 16:50   Ct Chest Wo Contrast  Result Date: 08/07/2016 CLINICAL DATA:  Dyspnea and lower back pain EXAM: CT CHEST WITHOUT CONTRAST TECHNIQUE: Multidetector CT imaging of the chest was performed following the standard protocol without IV contrast. COMPARISON:  CXR exams dating back through 07/31/2016 FINDINGS: Cardiovascular: Normal size cardiac chambers without pericardial effusion. Mitral annular calcifications are noted. Minimal coronary arteriosclerosis along the distal LAD. 4.2 cm ascending aortic aneurysm. Dilatation of the main pulmonary artery 3.5 cm consistent with a component of pulmonary hypertension. Mediastinum/Nodes: Prevertebral masslike abnormality along the descending thoracic aorta and esophagus possibly representing enlarged lymph node mass. This is estimated at 7.7 x 4.7 x 8 cm in transverse by AP by craniocaudad dimension. Further assessment is limited due to lack of oral and IV contrast. Lungs/Pleura:  Loculated right moderate-sized pleural effusion with compressive atelectasis. Tiny subpleural 4 and 5 mm nodular densities are seen the left upper lobe with pleural-based 4 mm right upper lobe nodular density also noted. These are nonspecific. Upper Abdomen: Retrocrural adenopathy is seen on the left measuring 2.1 cm short axis. Musculoskeletal: Thoracolumbar spondylosis. No acute osseous abnormality. No lytic or blastic disease. IMPRESSION: Confluent prevertebral soft tissue abnormality along the descending thoracic aorta and esophagus. Findings are concerning for an enlarged lymph node mass given retrocrural adenopathy also seen. This confluent masslike abnormality measures 7.7 x 4.7 x 8 cm. Moderate loculated right-sided pleural effusion with compressive atelectasis. Some of the atelectasis could potentially be post obstructive due to the adjacent mediastinal mass. Bilateral 5 mm or less nodular densities in the upper lobes. No follow-up needed if patient is low-risk (and has no known or suspected primary neoplasm). Non-contrast chest CT can be considered in 12 months if patient is high-risk. This  recommendation follows the consensus statement: Guidelines for Management of Incidental Pulmonary Nodules Detected on CT Images: From the Fleischner Society 2017; Radiology 2017; 284:228-243. Electronically Signed   By: Ashley Royalty M.D.   On: 08/07/2016 00:31   Ct Chest W Contrast  Result Date: 08/08/2016 CLINICAL DATA:  66 year old female with mediastinal adenopathy and shortness breath. Post thoracentesis. Cytology pending. Recently quit smoking. Evaluate for metastatic disease. Post abdominal hysterectomy. Subsequent encounter. EXAM: CT CHEST, ABDOMEN, AND PELVIS WITH CONTRAST TECHNIQUE: Multidetector CT imaging of the chest, abdomen and pelvis was performed following the standard protocol during bolus administration of intravenous contrast. CONTRAST:  141mL ISOVUE-300 IOPAMIDOL (ISOVUE-300) INJECTION 61%  COMPARISON:  08/07/2016 chest x-ray and chest CT. 05/24/2005 CT of the abdomen and pelvis. FINDINGS: CT CHEST FINDINGS Cardiovascular: Heart size within normal limits. Prominent mitral valve calcifications. Trace coronary artery calcifications. Prominent ascending thoracic aorta measuring up to 4 cm transverse dimension. Exam not tailored to evaluate for pulmonary embolus. No central pulmonary embolus noted. Main pulmonary artery prominent measuring 3.6 cm which may reflect component of pulmonary hypertension. 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 Question epidural extension of tumor versus engorged epidural veins. Contrast-enhanced MR can be obtained for further delineation of clinically desired. Lungs/Pleura: Partially loculated right-sided pleural effusion. Pleural extension of tumor posterior inferior right thorax (series 3, images 30 through 36). Confluent atelectasis with air bronchograms right lower lobe right middle lobe. Less confluent atelectasis inferior right upper lobe. No central obstructing mass identified. Tethered pleura with sub pleural thickening greatest lung base. Multiple tiny bilateral nodules throughout all lung zones. Accessory fissure left upper lobe incidentally noted. Trachea and mainstem bronchi are patent. Musculoskeletal: Although thoracic vertebra surrounded by mass, no obvious destruction. Degenerative changes throughout the lower cervical and thoracic spine. CT ABDOMEN PELVIS FINDINGS Hepatobiliary: Significantly enlarged liver spanning over 25.6 cm without focal hepatic lesion noted. No calcified gallstones. Pancreas: No primary pancreatic mass or inflammation. Spleen: No splenic mass or enlargement. Adrenals/Urinary Tract: No hydronephrosis. Left lower pole 1 cm low-density  structure statistically likely a cyst. No adrenal mass. Noncontrast filled views of the urinary bladder with limited evaluation secondary to habitus/ artifact. It would be difficult on the present examination to exclude a primary bladder abnormality. Stomach/Bowel: Diverticulosis with regions of muscular hypertrophy. No extraluminal bowel inflammatory process. Limited for evaluating for possibility of bowel/ stomach primary mass secondary to under distension. No obvious abnormality noted. Vascular/Lymphatic: Retroperitoneal, upper abdominal, mesenteric, pelvic and right inguinal adenopathy. Index lymph node right external iliac region measures 5.1 x 3.1 cm. Right inguinal adenopathy which may be assessable to biopsy measures 2.7 x 2 x 2.7 cm. Trace abdominal aortic calcification without aneurysm. No large arterial vessel occlusion. Reproductive: Post hysterectomy.  No obvious adnexal mass. Other: No free intraperitoneal air. Injection granulomas and subcutaneous gas from injection. Musculoskeletal: No destructive lesion identified. Degenerative changes most prominent L4-5 level with facet degenerative changes, mild anterior slip L4 and bulge contribute to multifactorial spinal stenosis. 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 Question epidural extension of tumor versus engorged epidural veins. Contrast-enhanced MR can be obtained for further delineation of clinically desired. Abdominal region retroperitoneal upper abdominal, mesenteric, pelvic and right inguinal adenopathy. Index lymph node  right external iliac region measures 5.1 x 3.1 cm. Right inguinal adenopathy which may be assessable to biopsy measures 2.7 x 2 x 2.7 cm. Partially loculated right-sided pleural effusion. Pleural extension of tumor posterior  inferior right thorax (series 3, images 30 through 36). Confluent atelectasis with air bronchograms right lower lobe right middle lobe. Less confluent atelectasis inferior right upper lobe. No central obstructing mass identified. Tethered pleura with sub pleural thickening greatest lung base. Multiple tiny bilateral nodules throughout all lung zones. Findings may reflect tumor such as small cell primary lung neoplasm in this smoker. Adenopathy from lymphoma is a possibility. Adenopathy and spread of tumor to lung from other primary tumor not excluded although other primary lesion not identified (evaluation of bowel limited by under distension without obvious mass). Significantly enlarged liver spanning over 25.6 cm. Aortic Atherosclerosis (ICD10-I70.0). Prominent ascending thoracic aorta measuring up to 4 cm transverse dimension. Recommend annual imaging followup by CTA or MRA. This recommendation follows 2010 ACCF/AHA/AATS/ACR/ASA/SCA/SCAI/SIR/STS/SVM Guidelines for the Diagnosis and Management of Patients with Thoracic Aortic Disease. Circulation. 2010; 121: Y706-C376 Exam not tailored to evaluate for pulmonary embolus. No central pulmonary embolus noted. Main pulmonary artery prominent measuring 3.6 cm which may reflect component of pulmonary hypertension. Electronically Signed   By: Genia Del M.D.   On: 08/08/2016 07:45   Mr Thoracic Spine W Wo Contrast  Result Date: 08/10/2016 CLINICAL DATA:  66 year old female with mediastinal lymphadenopathy and shortness of breath. Status post thoracentesis 08/07/2016. Prevertebral soft tissue mass by recent chest CT. EXAM: MRI THORACIC WITHOUT AND WITH CONTRAST TECHNIQUE: Multiplanar and multiecho pulse sequences of the thoracic spine were obtained without and with intravenous contrast. CONTRAST:  20 ml MULTIHANCE GADOBENATE DIMEGLUMINE 529 MG/ML IV SOLN COMPARISON:  CT chest 08/09/2015. FINDINGS: MRI THORACIC SPINE FINDINGS Alignment:  Maintained. Vertebrae: No  fracture. STIR imaging demonstrates marrow edema in the T8 and T9 vertebral bodies with corresponding decreased T1 signal most consistent with tumor infiltration. More subtle decreased T1 signal is identified in the T7, T10, T11 and likely T12 vertebral bodies worrisome for tumor infiltration. Cord:  Demonstrates normal signal. Paraspinal and other soft tissues: Right pleural effusion is identified. Large prevertebral tumor mass extending along both the right and left sides of multiple vertebral bodies is identified as seen on prior CT scan. There is extension of tumor along the pleura bilaterally. Large right pleural effusion is noted. Disc levels: Multilevel degenerative disease of the cervical spine is seen in the sagittal plane only. Axial imaging is limited but there is epidural tumor extending from approximately T7 through T11 as seen on the sagittal postcontrast images. Tumor bulk is largest from mid T8 to upper T10 where it is eccentrically prominent to the right. Epidural tumor on the right and anteriorly from the T8-9 to the T9-10 levels compresses and deforms the cord. See axial images 14-21 of series 12. No associated cord signal abnormality is identified. Disc bulging is seen in the upper and mid cervical spine but does not cause central canal stenosis. IMPRESSION: Large paraspinal mass as seen on prior CT scan. Abnormal marrow signal from T7-T11 is most conspicuous in T8 and T9 and consistent with tumor. 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 Epidural tumor is best seen on axial images 14-21 of series 12. Right pleural effusion. Multilevel cervical and thoracic spondylosis. Electronically Signed   By: Inge Rise M.D.   On: 08/10/2016 11:27   Ct  Abdomen Pelvis W Contrast  Result Date: 08/08/2016 CLINICAL DATA:  66 year old female with mediastinal adenopathy and shortness breath. Post thoracentesis. Cytology pending.  Recently quit smoking. Evaluate for metastatic disease. Post abdominal hysterectomy. Subsequent encounter. EXAM: CT CHEST, ABDOMEN, AND PELVIS WITH CONTRAST TECHNIQUE: Multidetector CT imaging of the chest, abdomen and pelvis was performed following the standard protocol during bolus administration of intravenous contrast. CONTRAST:  16mL ISOVUE-300 IOPAMIDOL (ISOVUE-300) INJECTION 61% COMPARISON:  08/07/2016 chest x-ray and chest CT. 05/24/2005 CT of the abdomen and pelvis. FINDINGS: CT CHEST FINDINGS Cardiovascular: Heart size within normal limits. Prominent mitral valve calcifications. Trace coronary artery calcifications. Prominent ascending thoracic aorta measuring up to 4 cm transverse dimension. Exam not tailored to evaluate for pulmonary embolus. No central pulmonary embolus noted. Main pulmonary artery prominent measuring 3.6 cm which may reflect component of pulmonary hypertension. 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 Question epidural extension of tumor versus engorged epidural veins. Contrast-enhanced MR can be obtained for further delineation of clinically desired. Lungs/Pleura: Partially loculated right-sided pleural effusion. Pleural extension of tumor posterior inferior right thorax (series 3, images 30 through 36). Confluent atelectasis with air bronchograms right lower lobe right middle lobe. Less confluent atelectasis inferior right upper lobe. No central obstructing mass identified. Tethered pleura with sub pleural thickening greatest lung base. Multiple tiny bilateral nodules throughout all lung zones. Accessory fissure left upper lobe incidentally noted. Trachea and mainstem bronchi are patent. Musculoskeletal: Although thoracic vertebra surrounded by mass, no obvious destruction.  Degenerative changes throughout the lower cervical and thoracic spine. CT ABDOMEN PELVIS FINDINGS Hepatobiliary: Significantly enlarged liver spanning over 25.6 cm without focal hepatic lesion noted. No calcified gallstones. Pancreas: No primary pancreatic mass or inflammation. Spleen: No splenic mass or enlargement. Adrenals/Urinary Tract: No hydronephrosis. Left lower pole 1 cm low-density structure statistically likely a cyst. No adrenal mass. Noncontrast filled views of the urinary bladder with limited evaluation secondary to habitus/ artifact. It would be difficult on the present examination to exclude a primary bladder abnormality. Stomach/Bowel: Diverticulosis with regions of muscular hypertrophy. No extraluminal bowel inflammatory process. Limited for evaluating for possibility of bowel/ stomach primary mass secondary to under distension. No obvious abnormality noted. Vascular/Lymphatic: Retroperitoneal, upper abdominal, mesenteric, pelvic and right inguinal adenopathy. Index lymph node right external iliac region measures 5.1 x 3.1 cm. Right inguinal adenopathy which may be assessable to biopsy measures 2.7 x 2 x 2.7 cm. Trace abdominal aortic calcification without aneurysm. No large arterial vessel occlusion. Reproductive: Post hysterectomy.  No obvious adnexal mass. Other: No free intraperitoneal air. Injection granulomas and subcutaneous gas from injection. Musculoskeletal: No destructive lesion identified. Degenerative changes most prominent L4-5 level with facet degenerative changes, mild anterior slip L4 and bulge contribute to multifactorial spinal stenosis. 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 Question epidural extension of tumor versus engorged epidural veins. Contrast-enhanced MR can be obtained  for further delineation of clinically desired. Abdominal region retroperitoneal upper abdominal, mesenteric, pelvic and right inguinal adenopathy. Index lymph node right external iliac region measures 5.1 x 3.1 cm. Right inguinal adenopathy which may be assessable to biopsy measures 2.7 x 2 x 2.7 cm. Partially loculated right-sided pleural effusion. Pleural extension of tumor posterior inferior right thorax (series  3, images 30 through 36). Confluent atelectasis with air bronchograms right lower lobe right middle lobe. Less confluent atelectasis inferior right upper lobe. No central obstructing mass identified. Tethered pleura with sub pleural thickening greatest lung base. Multiple tiny bilateral nodules throughout all lung zones. Findings may reflect tumor such as small cell primary lung neoplasm in this smoker. Adenopathy from lymphoma is a possibility. Adenopathy and spread of tumor to lung from other primary tumor not excluded although other primary lesion not identified (evaluation of bowel limited by under distension without obvious mass). Significantly enlarged liver spanning over 25.6 cm. Aortic Atherosclerosis (ICD10-I70.0). Prominent ascending thoracic aorta measuring up to 4 cm transverse dimension. Recommend annual imaging followup by CTA or MRA. This recommendation follows 2010 ACCF/AHA/AATS/ACR/ASA/SCA/SCAI/SIR/STS/SVM Guidelines for the Diagnosis and Management of Patients with Thoracic Aortic Disease. Circulation. 2010; 121: E703-J009 Exam not tailored to evaluate for pulmonary embolus. No central pulmonary embolus noted. Main pulmonary artery prominent measuring 3.6 cm which may reflect component of pulmonary hypertension. Electronically Signed   By: Genia Del M.D.   On: 08/08/2016 07:45   US Biopsy  Result Date: 08/09/2016 INDICATION: 66 year old female with newly diagnosed multifocal lymphadenopathy highly concerning for malignant lymphoma. EXAM: Ultrasound-guided core biopsy right  inguinal lymph node MEDICATIONS: None. ANESTHESIA/SEDATION: Moderate (conscious) sedation was employed during this procedure. A total of Versed 1.5 mg and Fentanyl 75 mcg was administered intravenously. Moderate Sedation Time: 13 minutes. The patient's level of consciousness and vital signs were monitored continuously by radiology nursing throughout the procedure under my direct supervision. FLUOROSCOPY TIME:  Fluoroscopy Time: 0 minutes 0 seconds (0 mGy). COMPLICATIONS: None immediate. PROCEDURE: Informed written consent was obtained from the patient after a thorough discussion of the procedural risks, benefits and alternatives. All questions were addressed. A timeout was performed prior to the initiation of the procedure. The right inguinal region was interrogated with ultrasound. A hypoechoic lymph node with internal vascularity measures 2.7 x 1.4 cm. A suitable skin entry site was selected and marked. Following sterile prep and drape in the standard fashion with chlorhexidine skin prep, local anesthesia was attained by infiltration with 1% lidocaine. A small dermatotomy was made. Using real-time sonographic guidance, multiple 16 gauge core biopsies were obtained using the Bard automated biopsy device. Biopsy specimens were placed in saline and delivered to pathology for further analysis. Post biopsy ultrasound imaging demonstrates no evidence of active bleeding or complication. IMPRESSION: Technically successful core biopsy of abnormal right inguinal lymph node. Signed, Criselda Peaches, MD [facility] Electronically Signed   By: Jacqulynn Cadet M.D.   On: 08/09/2016 13:12   Dg Chest Port 1 View  Result Date: 08/08/2016 CLINICAL DATA:  Respiratory failure EXAM: PORTABLE CHEST 1 VIEW COMPARISON:  08/07/2016 FINDINGS: Right pleural effusion and right lower lobe airspace disease unchanged. Left lung remains clear. Heart size normal. Negative for  heart failure. IMPRESSION: Right lower lobe airspace disease and right pleural effusion unchanged. Electronically Signed   By: Franchot Gallo M.D.   On: 08/08/2016 07:46   Ir Thoracentesis Asp Pleural Space W/img Guide  Result Date: 08/14/2016 INDICATION: Shortness of breath. Recurrent right pleural effusion. Request therapeutic thoracentesis. EXAM: ULTRASOUND GUIDED RIGHT THORACENTESIS MEDICATIONS: None. COMPLICATIONS: None immediate. Postprocedural chest x-ray negative for pneumothorax. PROCEDURE: An ultrasound guided thoracentesis was thoroughly discussed with the patient and questions answered. The benefits, risks, alternatives and complications were also discussed. The patient understands and wishes to proceed with the procedure. Written consent was obtained. Ultrasound was  performed to localize and mark an adequate pocket of fluid in the right chest. The area was then prepped and draped in the normal sterile fashion. 1% Lidocaine was used for local anesthesia. Under ultrasound guidance a Safe-T-Centesis catheter was introduced. Thoracentesis was performed. The catheter was removed and a dressing applied. FINDINGS: A total of approximately 840 mL of clear, amber colored fluid was removed. IMPRESSION: Successful ultrasound guided right thoracentesis yielding 840 mL of pleural fluid. Read by: Ascencion Dike PA-C Electronically Signed   By: Markus Daft M.D.   On: 08/14/2016 11:30   Ir Thoracentesis Asp Pleural Space W/img Guide  Result Date: 08/07/2016 INDICATION: Patient admitted with cough, dyspnea, orthopnea. Imaging reveals a a prevertebral soft tissue abnormality consistent with a mass as well as bilateral nodular densities in the upper lobes of the lungs. A moderate right pleural effusion is noted as well. Request has been made for diagnostic and therapeutic thoracentesis. EXAM: ULTRASOUND GUIDED DIAGNOSTIC AND THERAPEUTIC THORACENTESIS MEDICATIONS: 1% xylocaine COMPLICATIONS: None immediate. PROCEDURE:  An ultrasound guided thoracentesis was thoroughly discussed with the patient and questions answered. The benefits, risks, alternatives and complications were also discussed. The patient understands and wishes to proceed with the procedure. Written consent was obtained. Ultrasound was performed to localize and mark an adequate pocket of fluid in the right chest. The area was then prepped and draped in the normal sterile fashion. 1% Lidocaine was used for local anesthesia. Under ultrasound guidance a Safe-T-Centesis catheter was introduced. Thoracentesis was performed. The catheter was removed and a dressing applied. FINDINGS: A total of approximately 0.7 L of serosanguineous fluid was removed. Samples were sent to the laboratory as requested by the clinical team. IMPRESSION: Successful ultrasound guided right thoracentesis yielding 0.7 L of pleural fluid. Postprocedure chest radiograph shows no pneumothorax. Read by: Saverio Danker, PA-C Electronically Signed   By: Lucrezia Europe M.D.   On: 08/07/2016 13:05    Labs:  CBC:  Recent Labs  07/31/16 1703 08/06/16 2132 08/08/16 0509 08/13/16 0528  WBC  --  10.2 9.1 6.6  HGB 12.2 11.7* 11.3* 10.6*  HCT 36.0 36.5 35.4* 33.3*  PLT  --  323 318 255    COAGS:  Recent Labs  08/07/16 0442  INR 1.09    BMP:  Recent Labs  08/06/16 2132 08/08/16 0509 08/09/16 0535 08/13/16 0528  NA 139 139 139 137  K 4.0 4.2 3.8 4.1  CL 109 107 108 104  CO2 23 22 24 28   GLUCOSE 109* 105* 97 98  BUN 17 15 13 11   CALCIUM 9.1 8.7* 8.4* 8.6*  CREATININE 1.09* 0.92 0.83 0.86  GFRNONAA 52* >60 >60 >60  GFRAA >60 >60 >60 >60    LIVER FUNCTION TESTS:  Recent Labs  08/07/16 0442 08/07/16 1245 08/09/16 0535 08/13/16 0528  BILITOT 0.6  --  0.4 0.5  AST 20  --  17 26  ALT 19  --  15 23  ALKPHOS 51  --  43 48  PROT 6.6 6.6 5.9* 6.3*  ALBUMIN 3.1*  --  2.6* 2.7*    TUMOR MARKERS: No results for input(s): AFPTM, CEA, CA199, CHROMGRNA in the last 8760  hours.  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 All of the patient's questions were answered, patient is agreeable to proceed. Consent signed and in chart.  Thank you  for this interesting consult.  I greatly enjoyed meeting Loral Campi and look forward to participating in their care.  A copy of this report was sent to the requesting provider on this date.  Electronically Signed: Lavonia Drafts, PA-C 08/18/2016, 11:05 AM   I spent a total of  30 Minutes   in face to face in clinical consultation, greater than 50% of which was counseling/coordinating care for [facility] placement

## 2016-08-19 ENCOUNTER — Ambulatory Visit (HOSPITAL_COMMUNITY)
Admission: RE | Admit: 2016-08-19 | Discharge: 2016-08-19 | Disposition: A | Discharge status: Home / Self Care | Payer: Medicare Other | Source: Ambulatory Visit | Attending: Hematology | Admitting: Hematology

## 2016-08-19 DIAGNOSIS — C8338 Diffuse large B-cell lymphoma, lymph nodes of multiple sites: Secondary | ICD-10-CM | POA: Diagnosis present

## 2016-08-19 DIAGNOSIS — R918 Other nonspecific abnormal finding of lung field: Secondary | ICD-10-CM | POA: Diagnosis not present

## 2016-08-19 DIAGNOSIS — J90 Pleural effusion, not elsewhere classified: Secondary | ICD-10-CM | POA: Insufficient documentation

## 2016-08-19 LAB — GLUCOSE, CAPILLARY: GLUCOSE-CAPILLARY: 86 mg/dL (ref 65–99)

## 2016-08-19 MED ORDER — FLUDEOXYGLUCOSE F - 18 (FDG) INJECTION
12.5000 | Freq: Once | INTRAVENOUS | Status: AC | PRN
  Administered 2016-08-19: 12.5 via INTRAVENOUS

## 2016-08-21 ENCOUNTER — Other Ambulatory Visit: Payer: Self-pay | Admitting: Hematology

## 2016-08-21 ENCOUNTER — Telehealth: Payer: Self-pay | Admitting: *Deleted

## 2016-08-21 ENCOUNTER — Encounter (HOSPITAL_COMMUNITY): Payer: Self-pay | Admitting: Interventional Radiology

## 2016-08-21 DIAGNOSIS — C8590 Non-Hodgkin lymphoma, unspecified, unspecified site: Secondary | ICD-10-CM

## 2016-08-21 NOTE — Telephone Encounter (Signed)
Pt called twice requesting results of recent scan.  This RN attempted call back x2, no answer, no voice mail set up.  Pt scheduled to see Dr. Irene Limbo tomorrow prior to treatment.  He will discuss results at office visit.

## 2016-08-22 ENCOUNTER — Ambulatory Visit: Payer: Medicare Other

## 2016-08-22 ENCOUNTER — Ambulatory Visit (HOSPITAL_BASED_OUTPATIENT_CLINIC_OR_DEPARTMENT_OTHER): Payer: Medicare Other | Admitting: Hematology

## 2016-08-22 ENCOUNTER — Ambulatory Visit (HOSPITAL_BASED_OUTPATIENT_CLINIC_OR_DEPARTMENT_OTHER): Payer: Medicare Other

## 2016-08-22 ENCOUNTER — Other Ambulatory Visit (HOSPITAL_BASED_OUTPATIENT_CLINIC_OR_DEPARTMENT_OTHER): Payer: Medicare Other

## 2016-08-22 ENCOUNTER — Encounter: Payer: Self-pay | Admitting: Hematology

## 2016-08-22 VITALS — BP 132/72 | HR 57 | Temp 98.3°F | Resp 18

## 2016-08-22 VITALS — BP 170/88 | HR 46 | Temp 97.8°F | Resp 18 | Ht 61.0 in | Wt 299.9 lb

## 2016-08-22 DIAGNOSIS — Z95828 Presence of other vascular implants and grafts: Secondary | ICD-10-CM

## 2016-08-22 DIAGNOSIS — Z5112 Encounter for antineoplastic immunotherapy: Secondary | ICD-10-CM | POA: Diagnosis present

## 2016-08-22 DIAGNOSIS — C8338 Diffuse large B-cell lymphoma, lymph nodes of multiple sites: Secondary | ICD-10-CM

## 2016-08-22 DIAGNOSIS — M546 Pain in thoracic spine: Secondary | ICD-10-CM | POA: Diagnosis not present

## 2016-08-22 DIAGNOSIS — J90 Pleural effusion, not elsewhere classified: Secondary | ICD-10-CM

## 2016-08-22 DIAGNOSIS — Z5111 Encounter for antineoplastic chemotherapy: Secondary | ICD-10-CM | POA: Diagnosis not present

## 2016-08-22 DIAGNOSIS — C8330 Diffuse large B-cell lymphoma, unspecified site: Secondary | ICD-10-CM

## 2016-08-22 LAB — CBC & DIFF AND RETIC
BASO%: 0.1 % (ref 0.0–2.0)
Basophils Absolute: 0 10*3/uL (ref 0.0–0.1)
EOS%: 0.3 % (ref 0.0–7.0)
Eosinophils Absolute: 0 10*3/uL (ref 0.0–0.5)
HCT: 35.7 % (ref 34.8–46.6)
HGB: 11.3 g/dL — ABNORMAL LOW (ref 11.6–15.9)
IMMATURE RETIC FRACT: 3.3 % (ref 1.60–10.00)
LYMPH%: 14 % (ref 14.0–49.7)
MCH: 27.6 pg (ref 25.1–34.0)
MCHC: 31.7 g/dL (ref 31.5–36.0)
MCV: 87.1 fL (ref 79.5–101.0)
MONO#: 1.3 10*3/uL — AB (ref 0.1–0.9)
MONO%: 11.2 % (ref 0.0–14.0)
NEUT%: 74.4 % (ref 38.4–76.8)
NEUTROS ABS: 8.5 10*3/uL — AB (ref 1.5–6.5)
Platelets: 221 10*3/uL (ref 145–400)
RBC: 4.1 10*6/uL (ref 3.70–5.45)
RDW: 14.2 % (ref 11.2–14.5)
Retic %: 2.49 % — ABNORMAL HIGH (ref 0.70–2.10)
Retic Ct Abs: 102.09 10*3/uL — ABNORMAL HIGH (ref 33.70–90.70)
WBC: 11.4 10*3/uL — AB (ref 3.9–10.3)
lymph#: 1.6 10*3/uL (ref 0.9–3.3)

## 2016-08-22 LAB — COMPREHENSIVE METABOLIC PANEL
ALT: 27 U/L (ref 0–55)
AST: 14 U/L (ref 5–34)
Albumin: 2.9 g/dL — ABNORMAL LOW (ref 3.5–5.0)
Alkaline Phosphatase: 46 U/L (ref 40–150)
Anion Gap: 7 mEq/L (ref 3–11)
BILIRUBIN TOTAL: 0.3 mg/dL (ref 0.20–1.20)
BUN: 19.4 mg/dL (ref 7.0–26.0)
CHLORIDE: 106 meq/L (ref 98–109)
CO2: 27 meq/L (ref 22–29)
Calcium: 8.9 mg/dL (ref 8.4–10.4)
Creatinine: 0.9 mg/dL (ref 0.6–1.1)
EGFR: 74 mL/min/{1.73_m2} — AB (ref >90)
GLUCOSE: 101 mg/dL (ref 70–140)
POTASSIUM: 4 meq/L (ref 3.5–5.1)
SODIUM: 140 meq/L (ref 136–145)
TOTAL PROTEIN: 6.2 g/dL — AB (ref 6.4–8.3)

## 2016-08-22 LAB — LACTATE DEHYDROGENASE: LDH: 263 U/L — ABNORMAL HIGH (ref 125–245)

## 2016-08-22 MED ORDER — DEXAMETHASONE SODIUM PHOSPHATE 10 MG/ML IJ SOLN
INTRAMUSCULAR | Status: AC
  Filled 2016-08-22: qty 1

## 2016-08-22 MED ORDER — ACETAMINOPHEN 325 MG PO TABS
ORAL_TABLET | ORAL | Status: AC
  Filled 2016-08-22: qty 2

## 2016-08-22 MED ORDER — PALONOSETRON HCL INJECTION 0.25 MG/5ML
0.2500 mg | Freq: Once | INTRAVENOUS | Status: AC
  Administered 2016-08-22: 0.25 mg via INTRAVENOUS

## 2016-08-22 MED ORDER — PROCHLORPERAZINE MALEATE 10 MG PO TABS: 10.0000 mg | ORAL_TABLET | Freq: Four times a day (QID) | ORAL | 6 refills | Status: DC | PRN

## 2016-08-22 MED ORDER — ALBUTEROL SULFATE (2.5 MG/3ML) 0.083% IN NEBU
INHALATION_SOLUTION | RESPIRATORY_TRACT | Status: AC
  Filled 2016-08-22: qty 3

## 2016-08-22 MED ORDER — SODIUM CHLORIDE 0.9 % IV SOLN
375.0000 mg/m2 | Freq: Once | INTRAVENOUS | Status: AC
  Administered 2016-08-22: 900 mg via INTRAVENOUS
  Filled 2016-08-22: qty 50

## 2016-08-22 MED ORDER — DEXAMETHASONE SODIUM PHOSPHATE 10 MG/ML IJ SOLN
10.0000 mg | Freq: Once | INTRAMUSCULAR | Status: AC
  Administered 2016-08-22: 10 mg via INTRAVENOUS

## 2016-08-22 MED ORDER — SODIUM CHLORIDE 0.9 % IV SOLN: 10.0000 mg | Freq: Once | INTRAVENOUS | Status: DC

## 2016-08-22 MED ORDER — ALLOPURINOL 100 MG PO TABS: 100.0000 mg | ORAL_TABLET | Freq: Two times a day (BID) | ORAL | 0 refills | Status: DC

## 2016-08-22 MED ORDER — PALONOSETRON HCL INJECTION 0.25 MG/5ML
INTRAVENOUS | Status: AC
  Filled 2016-08-22: qty 5

## 2016-08-22 MED ORDER — DIPHENHYDRAMINE HCL 25 MG PO CAPS
ORAL_CAPSULE | ORAL | Status: AC
  Filled 2016-08-22: qty 2

## 2016-08-22 MED ORDER — SODIUM CHLORIDE 0.9 % IV SOLN
Freq: Once | INTRAVENOUS | Status: AC
  Administered 2016-08-22: 11:00:00 via INTRAVENOUS

## 2016-08-22 MED ORDER — SODIUM CHLORIDE 0.9% FLUSH
10.0000 mL | INTRAVENOUS | Status: DC | PRN
  Administered 2016-08-22: 10 mL
  Filled 2016-08-22: qty 10

## 2016-08-22 MED ORDER — METHYLPREDNISOLONE SODIUM SUCC 125 MG IJ SOLR
125.0000 mg | Freq: Once | INTRAMUSCULAR | Status: AC | PRN
  Administered 2016-08-22: 60 mg via INTRAVENOUS

## 2016-08-22 MED ORDER — SODIUM CHLORIDE 0.9 % IJ SOLN
10.0000 mL | Freq: Once | INTRAMUSCULAR | Status: AC
  Administered 2016-08-22: 10 mL
  Filled 2016-08-22: qty 10

## 2016-08-22 MED ORDER — SODIUM CHLORIDE 0.9 % IV SOLN
750.0000 mg/m2 | Freq: Once | INTRAVENOUS | Status: AC
  Administered 2016-08-22: 1780 mg via INTRAVENOUS
  Filled 2016-08-22: qty 89

## 2016-08-22 MED ORDER — METHYLPREDNISOLONE SODIUM SUCC 125 MG IJ SOLR
INTRAMUSCULAR | Status: AC
  Filled 2016-08-22: qty 2

## 2016-08-22 MED ORDER — DOXORUBICIN HCL CHEMO IV INJECTION 2 MG/ML
50.0000 mg/m2 | Freq: Once | INTRAVENOUS | Status: AC
  Administered 2016-08-22: 118 mg via INTRAVENOUS
  Filled 2016-08-22: qty 59

## 2016-08-22 MED ORDER — ONDANSETRON HCL 8 MG PO TABS: 8.0000 mg | ORAL_TABLET | Freq: Two times a day (BID) | ORAL | 1 refills | Status: DC | PRN

## 2016-08-22 MED ORDER — LIDOCAINE-PRILOCAINE 2.5-2.5 % EX CREA: TOPICAL_CREAM | CUTANEOUS | 3 refills | Status: DC

## 2016-08-22 MED ORDER — VINCRISTINE SULFATE CHEMO INJECTION 1 MG/ML
2.0000 mg | Freq: Once | INTRAVENOUS | Status: AC
  Administered 2016-08-22: 2 mg via INTRAVENOUS
  Filled 2016-08-22: qty 2

## 2016-08-22 MED ORDER — HEPARIN SOD (PORK) LOCK FLUSH 100 UNIT/ML IV SOLN
500.0000 [IU] | Freq: Once | INTRAVENOUS | Status: AC | PRN
  Administered 2016-08-22: 500 [IU]
  Filled 2016-08-22: qty 5

## 2016-08-22 MED ORDER — LIDOCAINE-PRILOCAINE 2.5-2.5 % EX CREA: 1.0000 "application " | TOPICAL_CREAM | CUTANEOUS | 6 refills | Status: DC | PRN

## 2016-08-22 MED ORDER — ALBUTEROL SULFATE (2.5 MG/3ML) 0.083% IN NEBU
2.5000 mg | INHALATION_SOLUTION | Freq: Once | RESPIRATORY_TRACT | Status: AC | PRN
  Administered 2016-08-22: 2.5 mg via RESPIRATORY_TRACT
  Filled 2016-08-22: qty 3

## 2016-08-22 MED ORDER — PREDNISONE 20 MG PO TABS: 60.0000 mg | ORAL_TABLET | Freq: Every day | ORAL | 5 refills | Status: DC

## 2016-08-22 MED ORDER — DIPHENHYDRAMINE HCL 25 MG PO CAPS
50.0000 mg | ORAL_CAPSULE | Freq: Once | ORAL | Status: AC
  Administered 2016-08-22: 50 mg via ORAL

## 2016-08-22 MED ORDER — ACETAMINOPHEN 325 MG PO TABS
650.0000 mg | ORAL_TABLET | Freq: Once | ORAL | Status: AC
  Administered 2016-08-22: 650 mg via ORAL

## 2016-08-22 NOTE — Patient Instructions (Signed)
[facility] Discharge Instructions for Patients Receiving Chemotherapy  Today you received the following chemotherapy agents Adriamycin, Vincristine, Cytoxan and Rituxan  To help prevent nausea and vomiting after your treatment, we encourage you to take your nausea medication as directed. Do not take zofran for 3 days.  May resume taking zofran on 08/24/16. If you develop nausea and vomiting that is not controlled by your nausea medication, call the clinic.   BELOW ARE SYMPTOMS THAT SHOULD BE REPORTED IMMEDIATELY:  *FEVER GREATER THAN 100.5 F  *CHILLS WITH OR WITHOUT FEVER  NAUSEA AND VOMITING THAT IS NOT CONTROLLED WITH YOUR NAUSEA MEDICATION  *UNUSUAL SHORTNESS OF BREATH  *UNUSUAL BRUISING OR BLEEDING  TENDERNESS IN MOUTH AND THROAT WITH OR WITHOUT PRESENCE OF ULCERS  *URINARY PROBLEMS  *BOWEL PROBLEMS  UNUSUAL RASH Items with * indicate a potential emergency and should be followed up as soon as possible.  Feel free to call the clinic you have any questions or concerns. The clinic phone number is [phone].  Please show the CHEMO ALERT CARD at check-in to the Emergency Department and triage nurse.  Doxorubicin injection What is this medicine? DOXORUBICIN (dox oh ROO bi sin) is a chemotherapy drug. It is used to treat many kinds of cancer like leukemia, lymphoma, neuroblastoma, sarcoma, and Wilms' tumor. It is also used to treat bladder cancer, breast cancer, lung cancer, ovarian cancer, stomach cancer, and thyroid cancer. This medicine may be used for other purposes; ask your health care provider or pharmacist if you have questions. COMMON BRAND NAME(S): Adriamycin, Adriamycin PFS, Adriamycin RDF, Rubex What should I tell my health care provider before I take this medicine? They need to know if you have any of these conditions: -heart disease -history of low blood counts caused by a medicine -liver disease -recent or ongoing radiation therapy -an  unusual or allergic reaction to doxorubicin, other chemotherapy agents, other medicines, foods, dyes, or preservatives -pregnant or trying to get pregnant -breast-feeding How should I use this medicine? This drug is given as an infusion into a vein. It is administered in a hospital or clinic by a specially trained health care professional. If you have pain, swelling, burning or any unusual feeling around the site of your injection, tell your health care professional right away. Talk to your pediatrician regarding the use of this medicine in children. Special care may be needed. Overdosage: If you think you have taken too much of this medicine contact a poison control center or emergency room at once. NOTE: This medicine is only for you. Do not share this medicine with others. What if I miss a dose? It is important not to miss your dose. Call your doctor or health care professional if you are unable to keep an appointment. What may interact with this medicine? This medicine may interact with the following medications: -6-mercaptopurine -paclitaxel -phenytoin -St. John's Wort -trastuzumab -verapamil This list may not describe all possible interactions. Give your health care provider a list of all the medicines, herbs, non-prescription drugs, or dietary supplements you use. Also tell them if you smoke, drink alcohol, or use illegal drugs. Some items may interact with your medicine. What should I watch for while using this medicine? This drug may make you feel generally unwell. This is not uncommon, as chemotherapy can affect healthy cells as well as cancer cells. Report any side effects. Continue your course of treatment even though you feel ill unless your doctor tells you to stop. There is a maximum amount   of this medicine you should receive throughout your life. The amount depends on the medical condition being treated and your overall health. Your doctor will watch how much of this medicine you  receive in your lifetime. Tell your doctor if you have taken this medicine before. You may need blood work done while you are taking this medicine. Your urine may turn red for a few days after your dose. This is not blood. If your urine is dark or brown, call your doctor. In some cases, you may be given additional medicines to help with side effects. Follow all directions for their use. Call your doctor or health care professional for advice if you get a fever, chills or sore throat, or other symptoms of a cold or flu. Do not treat yourself. This drug decreases your body's ability to fight infections. Try to avoid being around people who are sick. This medicine may increase your risk to bruise or bleed. Call your doctor or health care professional if you notice any unusual bleeding. Talk to your doctor about your risk of cancer. You may be more at risk for certain types of cancers if you take this medicine. Do not become pregnant while taking this medicine or for 6 months after stopping it. Women should inform their doctor if they wish to become pregnant or think they might be pregnant. Men should not father a child while taking this medicine and for 6 months after stopping it. There is a potential for serious side effects to an unborn child. Talk to your health care professional or pharmacist for more information. Do not breast-feed an infant while taking this medicine. This medicine has caused ovarian failure in some women and reduced sperm counts in some men This medicine may interfere with the ability to have a child. Talk with your doctor or health care professional if you are concerned about your fertility. What side effects may I notice from receiving this medicine? Side effects that you should report to your doctor or health care professional as soon as possible: -allergic reactions like skin rash, itching or hives, swelling of the face, lips, or tongue -breathing problems -chest pain -fast or  irregular heartbeat -low blood counts - this medicine may decrease the number of white blood cells, red blood cells and platelets. You may be at increased risk for infections and bleeding. -pain, redness, or irritation at site where injected -signs of infection - fever or chills, cough, sore throat, pain or difficulty passing urine -signs of decreased platelets or bleeding - bruising, pinpoint red spots on the skin, black, tarry stools, blood in the urine -swelling of the ankles, feet, hands -tiredness -weakness Side effects that usually do not require medical attention (report to your doctor or health care professional if they continue or are bothersome): -diarrhea -hair loss -mouth sores -nail discoloration or damage -nausea -red colored urine -vomiting This list may not describe all possible side effects. Call your doctor for medical advice about side effects. You may report side effects to FDA at 1-800-FDA-1088. Where should I keep my medicine? This drug is given in a hospital or clinic and will not be stored at home. NOTE: This sheet is a summary. It may not cover all possible information. If you have questions about this medicine, talk to your doctor, pharmacist, or health care provider.  2018 Elsevier/Gold Standard (2015-02-15 11:28:51)   Vincristine injection What is this medicine? VINCRISTINE (vin KRIS teen) is a chemotherapy drug. It slows the growth of cancer cells.   This medicine is used to treat many types of cancer like Hodgkin's disease, leukemia, non-Hodgkin's lymphoma, neuroblastoma (brain cancer), rhabdomyosarcoma, and Wilms' tumor. This medicine may be used for other purposes; ask your health care provider or pharmacist if you have questions. COMMON BRAND NAME(S): Oncovin, Vincasar PFS What should I tell my health care provider before I take this medicine? They need to know if you have any of these conditions: -blood disorders -gout -infection (especially  chickenpox, cold sores, or herpes) -kidney disease -liver disease -lung disease -nervous system disease like Charcot-Marie-Tooth (CMT) -recent or ongoing radiation therapy -an unusual or allergic reaction to vincristine, other chemotherapy agents, other medicines, foods, dyes, or preservatives -pregnant or trying to get pregnant -breast-feeding How should I use this medicine? This drug is given as an infusion into a vein. It is administered in a hospital or clinic by a specially trained health care professional. If you have pain, swelling, burning, or any unusual feeling around the site of your injection, tell your health care professional right away. Talk to your pediatrician regarding the use of this medicine in children. While this drug may be prescribed for selected conditions, precautions do apply. Overdosage: If you think you have taken too much of this medicine contact a poison control center or emergency room at once. NOTE: This medicine is only for you. Do not share this medicine with others. What if I miss a dose? It is important not to miss your dose. Call your doctor or health care professional if you are unable to keep an appointment. What may interact with this medicine? Do not take this medicine with any of the following medications: -itraconazole -mibefradil -voriconazole This medicine may also interact with the following medications: -cyclosporine -erythromycin -fluconazole -ketoconazole -medicines for HIV like delavirdine, efavirenz, nevirapine -medicines for seizures like ethotoin, fosphenotoin, phenytoin -medicines to increase blood counts like filgrastim, pegfilgrastim, sargramostim -other chemotherapy drugs like cisplatin, L-asparaginase, methotrexate, mitomycin, paclitaxel -pegaspargase -vaccines -zalcitabine, ddC Talk to your doctor or health care professional before taking any of these  medicines: -acetaminophen -aspirin -ibuprofen -ketoprofen -naproxen This list may not describe all possible interactions. Give your health care provider a list of all the medicines, herbs, non-prescription drugs, or dietary supplements you use. Also tell them if you smoke, drink alcohol, or use illegal drugs. Some items may interact with your medicine. What should I watch for while using this medicine? Your condition will be monitored carefully while you are receiving this medicine. You will need important blood work done while you are taking this medicine. This drug may make you feel generally unwell. This is not uncommon, as chemotherapy can affect healthy cells as well as cancer cells. Report any side effects. Continue your course of treatment even though you feel ill unless your doctor tells you to stop. In some cases, you may be given additional medicines to help with side effects. Follow all directions for their use. Call your doctor or health care professional for advice if you get a fever, chills or sore throat, or other symptoms of a cold or flu. Do not treat yourself. Avoid taking products that contain aspirin, acetaminophen, ibuprofen, naproxen, or ketoprofen unless instructed by your doctor. These medicines may hide a fever. Do not become pregnant while taking this medicine. Women should inform their doctor if they wish to become pregnant or think they might be pregnant. There is a potential for serious side effects to an unborn child. Talk to your health care professional or pharmacist for more information. Do   not breast-feed an infant while taking this medicine. Men may have a lower sperm count while taking this medicine. Talk to your doctor if you plan to father a child. What side effects may I notice from receiving this medicine? Side effects that you should report to your doctor or health care professional as soon as possible: -allergic reactions like skin rash, itching or hives,  swelling of the face, lips, or tongue -breathing problems -confusion or changes in emotions or moods -constipation -cough -mouth sores -muscle weakness -nausea and vomiting -pain, swelling, redness or irritation at the injection site -pain, tingling, numbness in the hands or feet -problems with balance, talking, walking -seizures -stomach pain -trouble passing urine or change in the amount of urine Side effects that usually do not require medical attention (report to your doctor or health care professional if they continue or are bothersome): -diarrhea -hair loss -jaw pain -loss of appetite This list may not describe all possible side effects. Call your doctor for medical advice about side effects. You may report side effects to FDA at 1-800-FDA-1088. Where should I keep my medicine? This drug is given in a hospital or clinic and will not be stored at home. NOTE: This sheet is a summary. It may not cover all possible information. If you have questions about this medicine, talk to your doctor, pharmacist, or health care provider.  2018 Elsevier/Gold Standard (2007-09-16 17:17:13)   Cyclophosphamide injection What is this medicine? CYCLOPHOSPHAMIDE (sye kloe FOSS fa mide) is a chemotherapy drug. It slows the growth of cancer cells. This medicine is used to treat many types of cancer like lymphoma, myeloma, leukemia, breast cancer, and ovarian cancer, to name a few. This medicine may be used for other purposes; ask your health care provider or pharmacist if you have questions. COMMON BRAND NAME(S): Cytoxan, Neosar What should I tell my health care provider before I take this medicine? They need to know if you have any of these conditions: -blood disorders -history of other chemotherapy -infection -kidney disease -liver disease -recent or ongoing radiation therapy -tumors in the bone marrow -an unusual or allergic reaction to cyclophosphamide, other chemotherapy, other medicines,  foods, dyes, or preservatives -pregnant or trying to get pregnant -breast-feeding How should I use this medicine? This drug is usually given as an injection into a vein or muscle or by infusion into a vein. It is administered in a hospital or clinic by a specially trained health care professional. Talk to your pediatrician regarding the use of this medicine in children. Special care may be needed. Overdosage: If you think you have taken too much of this medicine contact a poison control center or emergency room at once. NOTE: This medicine is only for you. Do not share this medicine with others. What if I miss a dose? It is important not to miss your dose. Call your doctor or health care professional if you are unable to keep an appointment. What may interact with this medicine? This medicine may interact with the following medications: -amiodarone -amphotericin B -azathioprine -certain antiviral medicines for HIV or AIDS such as protease inhibitors (e.g., indinavir, ritonavir) and zidovudine -certain blood pressure medications such as benazepril, captopril, enalapril, fosinopril, lisinopril, moexipril, monopril, perindopril, quinapril, ramipril, trandolapril -certain cancer medications such as anthracyclines (e.g., daunorubicin, doxorubicin), busulfan, cytarabine, paclitaxel, pentostatin, tamoxifen, trastuzumab -certain diuretics such as chlorothiazide, chlorthalidone, hydrochlorothiazide, indapamide, metolazone -certain medicines that treat or prevent blood clots like warfarin -certain muscle relaxants such as succinylcholine -cyclosporine -etanercept -indomethacin -medicines to increase   blood counts like filgrastim, pegfilgrastim, sargramostim -medicines used as general anesthesia -metronidazole -natalizumab This list may not describe all possible interactions. Give your health care provider a list of all the medicines, herbs, non-prescription drugs, or dietary supplements you use.  Also tell them if you smoke, drink alcohol, or use illegal drugs. Some items may interact with your medicine. What should I watch for while using this medicine? Visit your doctor for checks on your progress. This drug may make you feel generally unwell. This is not uncommon, as chemotherapy can affect healthy cells as well as cancer cells. Report any side effects. Continue your course of treatment even though you feel ill unless your doctor tells you to stop. Drink water or other fluids as directed. Urinate often, even at night. In some cases, you may be given additional medicines to help with side effects. Follow all directions for their use. Call your doctor or health care professional for advice if you get a fever, chills or sore throat, or other symptoms of a cold or flu. Do not treat yourself. This drug decreases your body's ability to fight infections. Try to avoid being around people who are sick. This medicine may increase your risk to bruise or bleed. Call your doctor or health care professional if you notice any unusual bleeding. Be careful brushing and flossing your teeth or using a toothpick because you may get an infection or bleed more easily. If you have any dental work done, tell your dentist you are receiving this medicine. You may get drowsy or dizzy. Do not drive, use machinery, or do anything that needs mental alertness until you know how this medicine affects you. Do not become pregnant while taking this medicine or for 1 year after stopping it. Women should inform their doctor if they wish to become pregnant or think they might be pregnant. Men should not father a child while taking this medicine and for 4 months after stopping it. There is a potential for serious side effects to an unborn child. Talk to your health care professional or pharmacist for more information. Do not breast-feed an infant while taking this medicine. This medicine may interfere with the ability to have a  child. This medicine has caused ovarian failure in some women. This medicine has caused reduced sperm counts in some men. You should talk with your doctor or health care professional if you are concerned about your fertility. If you are going to have surgery, tell your doctor or health care professional that you have taken this medicine. What side effects may I notice from receiving this medicine? Side effects that you should report to your doctor or health care professional as soon as possible: -allergic reactions like skin rash, itching or hives, swelling of the face, lips, or tongue -low blood counts - this medicine may decrease the number of white blood cells, red blood cells and platelets. You may be at increased risk for infections and bleeding. -signs of infection - fever or chills, cough, sore throat, pain or difficulty passing urine -signs of decreased platelets or bleeding - bruising, pinpoint red spots on the skin, black, tarry stools, blood in the urine -signs of decreased red blood cells - unusually weak or tired, fainting spells, lightheadedness -breathing problems -dark urine -dizziness -palpitations -swelling of the ankles, feet, hands -trouble passing urine or change in the amount of urine -weight gain -yellowing of the eyes or skin Side effects that usually do not require medical attention (report to your doctor or   health care professional if they continue or are bothersome): -changes in nail or skin color -hair loss -missed menstrual periods -mouth sores -nausea, vomiting This list may not describe all possible side effects. Call your doctor for medical advice about side effects. You may report side effects to FDA at 1-800-FDA-1088. Where should I keep my medicine? This drug is given in a hospital or clinic and will not be stored at home. NOTE: This sheet is a summary. It may not cover all possible information. If you have questions about this medicine, talk to your  doctor, pharmacist, or health care provider.  2018 Elsevier/Gold Standard (2011-11-03 16:22:58)   Rituximab injection What is this medicine? RITUXIMAB (ri TUX i mab) is a monoclonal antibody. It is used to treat certain types of cancer like non-Hodgkin lymphoma and chronic lymphocytic leukemia. It is also used to treat rheumatoid arthritis, granulomatosis with polyangiitis (or Wegener's granulomatosis), and microscopic polyangiitis. This medicine may be used for other purposes; ask your health care provider or pharmacist if you have questions. COMMON BRAND NAME(S): Rituxan What should I tell my health care provider before I take this medicine? They need to know if you have any of these conditions: -heart disease -infection (especially a virus infection such as hepatitis B, chickenpox, cold sores, or herpes) -immune system problems -irregular heartbeat -kidney disease -lung or breathing disease, like asthma -recently received or scheduled to receive a vaccine -an unusual or allergic reaction to rituximab, mouse proteins, other medicines, foods, dyes, or preservatives -pregnant or trying to get pregnant -breast-feeding How should I use this medicine? This medicine is for infusion into a vein. It is administered in a hospital or clinic by a specially trained health care professional. A special MedGuide will be given to you by the pharmacist with each prescription and refill. Be sure to read this information carefully each time. Talk to your pediatrician regarding the use of this medicine in children. This medicine is not approved for use in children. Overdosage: If you think you have taken too much of this medicine contact a poison control center or emergency room at once. NOTE: This medicine is only for you. Do not share this medicine with others. What if I miss a dose? It is important not to miss a dose. Call your doctor or health care professional if you are unable to keep an  appointment. What may interact with this medicine? -cisplatin -other medicines for arthritis like disease modifying antirheumatic drugs or tumor necrosis factor inhibitors -live virus vaccines This list may not describe all possible interactions. Give your health care provider a list of all the medicines, herbs, non-prescription drugs, or dietary supplements you use. Also tell them if you smoke, drink alcohol, or use illegal drugs. Some items may interact with your medicine. What should I watch for while using this medicine? Your condition will be monitored carefully while you are receiving this medicine. You may need blood work done while you are taking this medicine. This medicine can cause serious allergic reactions. To reduce your risk you may need to take medicine before treatment with this medicine. Take your medicine as directed. In some patients, this medicine may cause a serious brain infection that may cause death. If you have any problems seeing, thinking, speaking, walking, or standing, tell your doctor right away. If you cannot reach your doctor, urgently seek other source of medical care. Call your doctor or health care professional for advice if you get a fever, chills or sore throat, or other   symptoms of a cold or flu. Do not treat yourself. This drug decreases your body's ability to fight infections. Try to avoid being around people who are sick. Do not become pregnant while taking this medicine or for 12 months after stopping it. Women should inform their doctor if they wish to become pregnant or think they might be pregnant. There is a potential for serious side effects to an unborn child. Talk to your health care professional or pharmacist for more information. What side effects may I notice from receiving this medicine? Side effects that you should report to your doctor or health care professional as soon as possible: -breathing problems -chest pain -dizziness or feeling  faint -fast, irregular heartbeat -low blood counts - this medicine may decrease the number of white blood cells, red blood cells and platelets. You may be at increased risk for infections and bleeding. -mouth sores -redness, blistering, peeling or loosening of the skin, including inside the mouth (this can be added for any serious or exfoliative rash that could lead to hospitalization) -signs of infection - fever or chills, cough, sore throat, pain or difficulty passing urine -signs and symptoms of kidney injury like trouble passing urine or change in the amount of urine -signs and symptoms of liver injury like dark yellow or brown urine; general ill feeling or flu-like symptoms; light-colored stools; loss of appetite; nausea; right upper belly pain; unusually weak or tired; yellowing of the eyes or skin -stomach pain -vomiting Side effects that usually do not require medical attention (report to your doctor or health care professional if they continue or are bothersome): -headache -joint pain -muscle cramps or muscle pain This list may not describe all possible side effects. Call your doctor for medical advice about side effects. You may report side effects to FDA at 1-800-FDA-1088. Where should I keep my medicine? This drug is given in a hospital or clinic and will not be stored at home. NOTE: This sheet is a summary. It may not cover all possible information. If you have questions about this medicine, talk to your doctor, pharmacist, or health care provider.  2018 Elsevier/Gold Standard (2015-07-28 15:28:09)  

## 2016-08-22 NOTE — Progress Notes (Signed)
Blood return noted before, every 52mls and after Adriamycin push and before and after Vincristine infusion.   [id number] Patient began complaining of feeling "short of breath" and having a "hot flash". Rituxan stopped, NS hung wide open, vital signs taken and Dr. Irene Limbo notified. Per Dr. Irene Limbo, patient is to receive 60mg  IV Solu-Medrol and an albuterol inhaler. Per Dr. Ivin Booty may be resumed 10 minutes after administration of Solu-Medrol and Albuterol at the previous rate of 8ml/hr and may be increased as tolerated.   1615 Patient states all symptoms have resolved.

## 2016-08-22 NOTE — Patient Instructions (Signed)
Implanted Port Home Guide An implanted port is a type of central line that is placed under the skin. Central lines are used to provide IV access when treatment or nutrition needs to be given through a person's veins. Implanted ports are used for long-term IV access. An implanted port may be placed because:  You need IV medicine that would be irritating to the small veins in your hands or arms.  You need long-term IV medicines, such as antibiotics.  You need IV nutrition for a long period.  You need frequent blood draws for lab tests.  You need dialysis.  Implanted ports are usually placed in the chest area, but they can also be placed in the upper arm, the abdomen, or the leg. An implanted port has two main parts:  Reservoir. The reservoir is round and will appear as a small, raised area under your skin. The reservoir is the part where a needle is inserted to give medicines or draw blood.  Catheter. The catheter is a thin, flexible tube that extends from the reservoir. The catheter is placed into a large vein. Medicine that is inserted into the reservoir goes into the catheter and then into the vein.  How will I care for my incision site? Do not get the incision site wet. Bathe or shower as directed by your health care provider. How is my port accessed? Special steps must be taken to access the port:  Before the port is accessed, a numbing cream can be placed on the skin. This helps numb the skin over the port site.  Your health care provider uses a sterile technique to access the port. ? Your health care provider must put on a mask and sterile gloves. ? The skin over your port is cleaned carefully with an antiseptic and allowed to dry. ? The port is gently pinched between sterile gloves, and a needle is inserted into the port.  Only "non-coring" port needles should be used to access the port. Once the port is accessed, a blood return should be checked. This helps ensure that the port  is in the vein and is not clogged.  If your port needs to remain accessed for a constant infusion, a clear (transparent) bandage will be placed over the needle site. The bandage and needle will need to be changed every week, or as directed by your health care provider.  Keep the bandage covering the needle clean and dry. Do not get it wet. Follow your health care provider's instructions on how to take a shower or bath while the port is accessed.  If your port does not need to stay accessed, no bandage is needed over the port.  What is flushing? Flushing helps keep the port from getting clogged. Follow your health care provider's instructions on how and when to flush the port. Ports are usually flushed with saline solution or a medicine called heparin. The need for flushing will depend on how the port is used.  If the port is used for intermittent medicines or blood draws, the port will need to be flushed: ? After medicines have been given. ? After blood has been drawn. ? As part of routine maintenance.  If a constant infusion is running, the port may not need to be flushed.  How long will my port stay implanted? The port can stay in for as long as your health care provider thinks it is needed. When it is time for the port to come out, surgery will be   done to remove it. The procedure is similar to the one performed when the port was put in. When should I seek immediate medical care? When you have an implanted port, you should seek immediate medical care if:  You notice a bad smell coming from the incision site.  You have swelling, redness, or drainage at the incision site.  You have more swelling or pain at the port site or the surrounding area.  You have a fever that is not controlled with medicine.  This information is not intended to replace advice given to you by your health care provider. Make sure you discuss any questions you have with your health care provider. Document  Released: 12/19/2004 Document Revised: 05/27/2015 Document Reviewed: 08/26/2012 Elsevier Interactive Patient Education  2017 Elsevier Inc.  

## 2016-08-22 NOTE — Patient Instructions (Signed)
Thank you for choosing [facility] to provide your oncology and hematology care.  To afford each patient quality time with our providers, please arrive 30 minutes before your scheduled appointment time.  If you arrive late for your appointment, you may be asked to reschedule.  We strive to give you quality time with our providers, and arriving late affects you and other patients whose appointments are after yours.   If you are a no show for multiple scheduled visits, you may be dismissed from the clinic at the providers discretion.    Again, thank you for choosing [facility], our hope is that these requests will decrease the amount of time that you wait before being seen by our physicians.  ______________________________________________________________________  Should you have questions after your visit to the [facility], please contact our office at [phone] between the hours of 8:30 and 4:30 p.m.    Voicemails left after 4:30p.m will not be returned until the following business day.    For prescription refill requests, please have your pharmacy contact us directly.  Please also try to allow 48 hours for prescription requests.    Please contact the scheduling department for questions regarding scheduling.  For scheduling of procedures such as PET scans, CT scans, MRI, Ultrasound, etc please contact central scheduling at [phone].    Resources For Cancer Patients and Caregivers:   Oncolink.org:  A wonderful resource for patients and healthcare providers for information regarding your disease, ways to tract your treatment, what to expect, etc.     American Cancer Society:  800-227-2345  Can help patients locate various types of support and financial assistance  Cancer Care: 1-800-813-HOPE (4673) Provides financial assistance, online support groups, medication/co-pay assistance.    [place] DSS:  336-641-3447 Where to apply for food  stamps, Medicaid, and utility assistance  Medicare Rights Center: 800-333-4114 Helps people with Medicare understand their rights and benefits, navigate the Medicare system, and secure the quality healthcare they deserve  [employer]: 336-333-6589  [employer]'s shared-ride transportation service for eligible riders who have a disability that prevents them from riding the fixed route bus.    For additional information on assistance programs please contact our social worker:   Grier Hock/Abigail Elmore:  [phone]            

## 2016-08-23 LAB — HEPATITIS B SURFACE ANTIGEN: HBsAg Screen: NEGATIVE

## 2016-08-23 LAB — HEPATITIS B CORE ANTIBODY, TOTAL: HEP B C TOTAL AB: NEGATIVE

## 2016-08-23 LAB — HEPATITIS C ANTIBODY: HEP C VIRUS AB: 0.2 {s_co_ratio} (ref 0.0–0.9)

## 2016-08-24 ENCOUNTER — Ambulatory Visit (HOSPITAL_BASED_OUTPATIENT_CLINIC_OR_DEPARTMENT_OTHER): Payer: Medicare Other

## 2016-08-24 VITALS — BP 150/82 | HR 66 | Temp 97.9°F | Resp 24

## 2016-08-24 DIAGNOSIS — Z5189 Encounter for other specified aftercare: Secondary | ICD-10-CM

## 2016-08-24 DIAGNOSIS — C8338 Diffuse large B-cell lymphoma, lymph nodes of multiple sites: Secondary | ICD-10-CM

## 2016-08-24 MED ORDER — PEGFILGRASTIM INJECTION 6 MG/0.6ML ~~LOC~~
6.0000 mg | PREFILLED_SYRINGE | Freq: Once | SUBCUTANEOUS | Status: AC
  Administered 2016-08-24: 6 mg via SUBCUTANEOUS
  Filled 2016-08-24: qty 0.6

## 2016-08-24 NOTE — Patient Instructions (Signed)
Pegfilgrastim injection What is this medicine? PEGFILGRASTIM (PEG fil gra stim) is a long-acting granulocyte colony-stimulating factor that stimulates the growth of neutrophils, a type of white blood cell important in the body's fight against infection. It is used to reduce the incidence of fever and infection in patients with certain types of cancer who are receiving chemotherapy that affects the bone marrow, and to increase survival after being exposed to high doses of radiation. This medicine may be used for other purposes; ask your health care provider or pharmacist if you have questions. COMMON BRAND NAME(S): Neulasta What should I tell my health care provider before I take this medicine? They need to know if you have any of these conditions: -kidney disease -latex allergy -ongoing radiation therapy -sickle cell disease -skin reactions to acrylic adhesives (On-Body Injector only) -an unusual or allergic reaction to pegfilgrastim, filgrastim, other medicines, foods, dyes, or preservatives -pregnant or trying to get pregnant -breast-feeding How should I use this medicine? This medicine is for injection under the skin. If you get this medicine at home, you will be taught how to prepare and give the pre-filled syringe or how to use the On-body Injector. Refer to the patient Instructions for Use for detailed instructions. Use exactly as directed. Tell your healthcare provider immediately if you suspect that the On-body Injector may not have performed as intended or if you suspect the use of the On-body Injector resulted in a missed or partial dose. It is important that you put your used needles and syringes in a special sharps container. Do not put them in a trash can. If you do not have a sharps container, call your pharmacist or healthcare provider to get one. Talk to your pediatrician regarding the use of this medicine in children. While this drug may be prescribed for selected conditions,  precautions do apply. Overdosage: If you think you have taken too much of this medicine contact a poison control center or emergency room at once. NOTE: This medicine is only for you. Do not share this medicine with others. What if I miss a dose? It is important not to miss your dose. Call your doctor or health care professional if you miss your dose. If you miss a dose due to an On-body Injector failure or leakage, a new dose should be administered as soon as possible using a single prefilled syringe for manual use. What may interact with this medicine? Interactions have not been studied. Give your health care provider a list of all the medicines, herbs, non-prescription drugs, or dietary supplements you use. Also tell them if you smoke, drink alcohol, or use illegal drugs. Some items may interact with your medicine. This list may not describe all possible interactions. Give your health care provider a list of all the medicines, herbs, non-prescription drugs, or dietary supplements you use. Also tell them if you smoke, drink alcohol, or use illegal drugs. Some items may interact with your medicine. What should I watch for while using this medicine? You may need blood work done while you are taking this medicine. If you are going to need a MRI, CT scan, or other procedure, tell your doctor that you are using this medicine (On-Body Injector only). What side effects may I notice from receiving this medicine? Side effects that you should report to your doctor or health care professional as soon as possible: -allergic reactions like skin rash, itching or hives, swelling of the face, lips, or tongue -dizziness -fever -pain, redness, or irritation at site   where injected -pinpoint red spots on the skin -red or dark-brown urine -shortness of breath or breathing problems -stomach or side pain, or pain at the shoulder -swelling -tiredness -trouble passing urine or change in the amount of urine Side  effects that usually do not require medical attention (report to your doctor or health care professional if they continue or are bothersome): -bone pain -muscle pain This list may not describe all possible side effects. Call your doctor for medical advice about side effects. You may report side effects to FDA at 1-800-FDA-1088. Where should I keep my medicine? Keep out of the reach of children. Store pre-filled syringes in a refrigerator between 2 and 8 degrees C (36 and 46 degrees F). Do not freeze. Keep in carton to protect from light. Throw away this medicine if it is left out of the refrigerator for more than 48 hours. Throw away any unused medicine after the expiration date. NOTE: This sheet is a summary. It may not cover all possible information. If you have questions about this medicine, talk to your doctor, pharmacist, or health care provider.  2018 Elsevier/Gold Standard (2015-12-16 12:58:03)  

## 2016-08-28 ENCOUNTER — Telehealth: Payer: Self-pay

## 2016-08-28 NOTE — Progress Notes (Signed)
[name]  HEMATOLOGY ONCOLOGY PROGRESS NOTE  Date of service: .08/22/2016  Patient Care Team: Deland Pretty, MD as PCP - General (Internal Medicine)  CC: post hospitalization f/u for management of newly diagnosed diffuse large B cell lymphoma  Diagnosis: Diffuse large B cell lymphoma  Current Treatment: R-CHOP with G-CSF support  INTERVAL HISTORY:  Patient is here for post hospitalization f/u for DLBCL. She notes back pain and breathing improved with dexamethasone. PET/CT done for staging which was reviewed with her and her husband in details. Port-cath placed. ECHO with nl EF. Chemo-counseling completed and informed consent obtained to proceed with 1st cycle of treatment.  REVIEW OF SYSTEMS:    10 Point review of systems of done and is negative except as noted above.  . Past Medical History:  Diagnosis Date  . GERD (gastroesophageal reflux disease)   . NHL (non-Hodgkin's lymphoma) ([place])     . Past Surgical History:  Procedure Laterality Date  . ABDOMINAL HYSTERECTOMY    . IR FLUORO GUIDE PORT INSERTION RIGHT  08/18/2016  . IR THORACENTESIS ASP PLEURAL SPACE W/IMG GUIDE  08/07/2016  . IR THORACENTESIS ASP PLEURAL SPACE W/IMG GUIDE  08/14/2016  . IR US GUIDE VASC ACCESS RIGHT  08/18/2016    . Social History  Substance Use Topics  . Smoking status: Former Smoker    Packs/day: 0.50    Years: 47.00    Types: Cigarettes    Quit date: 07/28/2016  . Smokeless tobacco: Never Used  . Alcohol use No    ALLERGIES:  has No Known Allergies.  MEDICATIONS:  Current Outpatient Prescriptions  Medication Sig Dispense Refill  . allopurinol (ZYLOPRIM) 100 MG tablet Take 1 tablet (100 mg total) by mouth 2 (two) times daily. 30 tablet 0  . dexamethasone (DECADRON) 4 MG tablet Take 2 tablets (8 mg total) by mouth daily. 15 tablet 0  . lidocaine-prilocaine (EMLA) cream Apply to affected area once 30 g 3  . lidocaine-prilocaine (EMLA) cream Apply 1 application topically as needed. Apply to  port-a-cath assess site 45-60 mins prior to chemotherapy. 30 g 6  . Multiple Vitamin (MULTIVITAMIN WITH MINERALS) TABS tablet Take 1 tablet by mouth daily.    . ondansetron (ZOFRAN) 8 MG tablet Take 1 tablet (8 mg total) by mouth 2 (two) times daily as needed for refractory nausea / vomiting. Start on day 3 after cyclophosphamide rx 30 tablet 1  . oxyCODONE-acetaminophen (PERCOCET/ROXICET) 5-325 MG tablet Take 2 tablets by mouth every 4 (four) hours as needed for moderate pain. 30 tablet 0  . predniSONE (DELTASONE) 20 MG tablet Take 3 tablets (60 mg total) by mouth daily. Take on days 1-5 of chemotherapy. 20 tablet 5  . prochlorperazine (COMPAZINE) 10 MG tablet Take 1 tablet (10 mg total) by mouth every 6 (six) hours as needed (Nausea or vomiting). 30 tablet 6  . vitamin B-12 500 MCG tablet Take 1 tablet (500 mcg total) by mouth daily. 30 tablet 0  . magnesium oxide (MAG-OX) 400 MG tablet Take 400 mg by mouth daily.    . polyethylene glycol (MIRALAX / GLYCOLAX) packet Take 17 g by mouth daily. (Patient not taking: Reported on 08/22/2016) 14 each 0   No current facility-administered medications for this visit.     PHYSICAL EXAMINATION: ECOG PERFORMANCE STATUS: 1 - Symptomatic but completely ambulatory  . Vitals:   08/22/16 0947  BP: (!) 170/88  Pulse: (!) 46  Resp: 18  Temp: 97.8 F (36.6 C)  SpO2: 100%    Filed Weights  08/22/16 0947  Weight: 299 lb 14.4 oz (136 kg)   .Body mass index is 56.67 kg/m.  GENERAL:alert, in no acute distress and comfortable SKIN: no acute rashes, no significant lesions EYES: conjunctiva are pink and non-injected, sclera anicteric OROPHARYNX: MMM, no exudates, no oropharyngeal erythema or ulceration NECK: supple, no JVD LYMPH:  no palpable lymphadenopathy in the cervical, axillary or inguinal regions LUNGS: clear to auscultation b/l with normal respiratory effort HEART: regular rate & rhythm ABDOMEN:  normoactive bowel sounds , non tender, not  distended. Extremity: no pedal edema PSYCH: alert & oriented x 3 with fluent speech NEURO: no focal motor/sensory deficits  LABORATORY DATA:   I have reviewed the data as listed  . CBC Latest Ref Rng & Units 08/22/2016 08/18/2016 08/13/2016  WBC 3.9 - 10.3 10e3/uL 11.4(H) 8.9 6.6  Hemoglobin 11.6 - 15.9 g/dL 11.3(L) 11.0(L) 10.6(L)  Hematocrit 34.8 - 46.6 % 35.7 35.0(L) 33.3(L)  Platelets 145 - 400 10e3/uL 221 250 255    . CMP Latest Ref Rng & Units 08/22/2016 08/18/2016 08/13/2016  Glucose 70 - 140 mg/dl 101 95 98  BUN 7.0 - 26.0 mg/dL 19.4 14 11   Creatinine 0.6 - 1.1 mg/dL 0.9 1.07(H) 0.86  Sodium 136 - 145 mEq/L 140 143 137  Potassium 3.5 - 5.1 mEq/L 4.0 3.4(L) 4.1  Chloride 101 - 111 mmol/L - 105 104  CO2 22 - 29 mEq/L 27 30 28   Calcium 8.4 - 10.4 mg/dL 8.9 8.6(L) 8.6(L)  Total Protein 6.4 - 8.3 g/dL 6.2(L) - 6.3(L)  Total Bilirubin 0.20 - 1.20 mg/dL 0.30 - 0.5  Alkaline Phos 40 - 150 U/L 46 - 48  AST 5 - 34 U/L 14 - 26  ALT 0 - 55 U/L 27 - 23   . Lab Results  Component Value Date   LDH 263 (H) 08/22/2016     RADIOGRAPHIC STUDIES: I have personally reviewed the radiological images as listed and agreed with the findings in the report. Dg Chest 1 View  Result Date: 08/14/2016 CLINICAL DATA:  Status post right-sided thoracentesis EXAM: CHEST 1 VIEW COMPARISON:  Chest x-ray dated August 13, 2016 FINDINGS: The left lung is adequately inflated. On the right there is improved aeration at the lung base since thoracentesis. The volume of pleural fluid has decreased somewhat. No postprocedure pneumothorax is observed. The cardiac silhouette remains enlarged. The central pulmonary vascularity is prominent but may be accentuated by patient positioning. IMPRESSION: No postprocedure pneumothorax following right-sided thoracentesis. Some improved aeration of the right lung is seen. Electronically Signed   By: David  Martinique M.D.   On: 08/14/2016 11:20   Dg Chest 1 View  Result Date:  08/07/2016 CLINICAL DATA:  Status post right thoracentesis. EXAM: CHEST 1 VIEW COMPARISON:  08/06/2016 chest radiograph. FINDINGS: Stable cardiomediastinal silhouette with normal heart size. No pneumothorax. Small right pleural effusion, decreased. No left pleural effusion. No pulmonary edema. Patchy right lung base opacity is mildly decreased. No new consolidative airspace disease. IMPRESSION: 1. No pneumothorax. 2. Small right pleural effusion, decreased . 3. Nonspecific patchy right lung base opacity, decreased. Electronically Signed   By: Ilona Sorrel M.D.   On: 08/07/2016 13:23   Dg Chest 2 View  Result Date: 08/13/2016 CLINICAL DATA:  Right-sided pleural effusion. EXAM: CHEST  2 VIEW COMPARISON:  MRI of the thoracic spine 08/10/2016. Chest x-ray and CT 08/08/16. FINDINGS: Heart size normal. A right paraspinal mass is again noted. A right pleural effusion is increasing. No significant consolidation is present on the  left. IMPRESSION: 1. Increasing right-sided pleural effusion and airspace opacity. 2. Right paraspinal mass. Electronically Signed   By: San Morelle M.D.   On: 08/13/2016 14:35   Dg Chest 2 View  Result Date: 08/06/2016 CLINICAL DATA:  Acute onset of shortness of breath and lower back pain. Initial encounter. EXAM: CHEST  2 VIEW COMPARISON:  Chest radiograph performed 07/31/2016 FINDINGS: A small-to-moderate right-sided pleural effusion is noted, with right basilar airspace opacification, concerning for pneumonia. No pneumothorax is seen. The left lung appears clear. The heart is borderline normal in size. No acute osseous abnormalities are identified. IMPRESSION: Small to moderate right-sided pleural effusion, with right basilar airspace opacification, concerning for pneumonia. Electronically Signed   By: Garald Balding M.D.   On: 08/06/2016 23:15   Dg Chest 2 View  Result Date: 07/31/2016 CLINICAL DATA:  Three days of nonproductive cough associated with shortness of breath and  wheezing. Current smoker. EXAM: CHEST  2 VIEW COMPARISON:  Report of a chest x-ray of May 28, 2001. FINDINGS: The left lung is clear. On the right there is volume loss with obscuration of the hemidiaphragm. There is fluid in the minor fissure. There is no mediastinal shift. The heart and pulmonary vascularity are normal. There is mild tortuosity of the ascending thoracic aorta. The bony thorax exhibits no acute abnormality. IMPRESSION: Atelectasis or pneumonia with right pleural E fusion. Followup PA and lateral chest X-ray is recommended in 3-4 weeks following trial of antibiotic therapy to ensure resolution and exclude underlying malignancy. Electronically Signed   By: David  Martinique M.D.   On: 07/31/2016 16:50   Ct Chest Wo Contrast  Result Date: 08/07/2016 CLINICAL DATA:  Dyspnea and lower back pain EXAM: CT CHEST WITHOUT CONTRAST TECHNIQUE: Multidetector CT imaging of the chest was performed following the standard protocol without IV contrast. COMPARISON:  CXR exams dating back through 07/31/2016 FINDINGS: Cardiovascular: Normal size cardiac chambers without pericardial effusion. Mitral annular calcifications are noted. Minimal coronary arteriosclerosis along the distal LAD. 4.2 cm ascending aortic aneurysm. Dilatation of the main pulmonary artery 3.5 cm consistent with a component of pulmonary hypertension. Mediastinum/Nodes: Prevertebral masslike abnormality along the descending thoracic aorta and esophagus possibly representing enlarged lymph node mass. This is estimated at 7.7 x 4.7 x 8 cm in transverse by AP by craniocaudad dimension. Further assessment is limited due to lack of oral and IV contrast. Lungs/Pleura: Loculated right moderate-sized pleural effusion with compressive atelectasis. Tiny subpleural 4 and 5 mm nodular densities are seen the left upper lobe with pleural-based 4 mm right upper lobe nodular density also noted. These are nonspecific. Upper Abdomen: Retrocrural adenopathy is seen on  the left measuring 2.1 cm short axis. Musculoskeletal: Thoracolumbar spondylosis. No acute osseous abnormality. No lytic or blastic disease. IMPRESSION: Confluent prevertebral soft tissue abnormality along the descending thoracic aorta and esophagus. Findings are concerning for an enlarged lymph node mass given retrocrural adenopathy also seen. This confluent masslike abnormality measures 7.7 x 4.7 x 8 cm. Moderate loculated right-sided pleural effusion with compressive atelectasis. Some of the atelectasis could potentially be post obstructive due to the adjacent mediastinal mass. Bilateral 5 mm or less nodular densities in the upper lobes. No follow-up needed if patient is low-risk (and has no known or suspected primary neoplasm). Non-contrast chest CT can be considered in 12 months if patient is high-risk. This recommendation follows the consensus statement: Guidelines for Management of Incidental Pulmonary Nodules Detected on CT Images: From the Fleischner Society 2017; Radiology 2017; 284:228-243. Electronically Signed  By: Ashley Royalty M.D.   On: 08/07/2016 00:31   Ct Chest W Contrast  Result Date: 08/08/2016 CLINICAL DATA:  66 year old female with mediastinal adenopathy and shortness breath. Post thoracentesis. Cytology pending. Recently quit smoking. Evaluate for metastatic disease. Post abdominal hysterectomy. Subsequent encounter. EXAM: CT CHEST, ABDOMEN, AND PELVIS WITH CONTRAST TECHNIQUE: Multidetector CT imaging of the chest, abdomen and pelvis was performed following the standard protocol during bolus administration of intravenous contrast. CONTRAST:  157mL ISOVUE-300 IOPAMIDOL (ISOVUE-300) INJECTION 61% COMPARISON:  08/07/2016 chest x-ray and chest CT. 05/24/2005 CT of the abdomen and pelvis. FINDINGS: CT CHEST FINDINGS Cardiovascular: Heart size within normal limits. Prominent mitral valve calcifications. Trace coronary artery calcifications. Prominent ascending thoracic aorta measuring up to 4 cm  transverse dimension. Exam not tailored to evaluate for pulmonary embolus. No central pulmonary embolus noted. Main pulmonary artery prominent measuring 3.6 cm which may reflect component of pulmonary hypertension. 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 Question epidural extension of tumor versus engorged epidural veins. Contrast-enhanced MR can be obtained for further delineation of clinically desired. Lungs/Pleura: Partially loculated right-sided pleural effusion. Pleural extension of tumor posterior inferior right thorax (series 3, images 30 through 36). Confluent atelectasis with air bronchograms right lower lobe right middle lobe. Less confluent atelectasis inferior right upper lobe. No central obstructing mass identified. Tethered pleura with sub pleural thickening greatest lung base. Multiple tiny bilateral nodules throughout all lung zones. Accessory fissure left upper lobe incidentally noted. Trachea and mainstem bronchi are patent. Musculoskeletal: Although thoracic vertebra surrounded by mass, no obvious destruction. Degenerative changes throughout the lower cervical and thoracic spine. CT ABDOMEN PELVIS FINDINGS Hepatobiliary: Significantly enlarged liver spanning over 25.6 cm without focal hepatic lesion noted. No calcified gallstones. Pancreas: No primary pancreatic mass or inflammation. Spleen: No splenic mass or enlargement. Adrenals/Urinary Tract: No hydronephrosis. Left lower pole 1 cm low-density structure statistically likely a cyst. No adrenal mass. Noncontrast filled views of the urinary bladder with limited evaluation secondary to habitus/ artifact. It would be difficult on the present examination to exclude a primary bladder abnormality. Stomach/Bowel: Diverticulosis with regions of  muscular hypertrophy. No extraluminal bowel inflammatory process. Limited for evaluating for possibility of bowel/ stomach primary mass secondary to under distension. No obvious abnormality noted. Vascular/Lymphatic: Retroperitoneal, upper abdominal, mesenteric, pelvic and right inguinal adenopathy. Index lymph node right external iliac region measures 5.1 x 3.1 cm. Right inguinal adenopathy which may be assessable to biopsy measures 2.7 x 2 x 2.7 cm. Trace abdominal aortic calcification without aneurysm. No large arterial vessel occlusion. Reproductive: Post hysterectomy.  No obvious adnexal mass. Other: No free intraperitoneal air. Injection granulomas and subcutaneous gas from injection. Musculoskeletal: No destructive lesion identified. Degenerative changes most prominent L4-5 level with facet degenerative changes, mild anterior slip L4 and bulge contribute to multifactorial spinal stenosis. 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 Question epidural extension of tumor versus engorged epidural veins. Contrast-enhanced MR can be obtained for further delineation of clinically desired. Abdominal region retroperitoneal upper abdominal, mesenteric, pelvic and right inguinal adenopathy. Index lymph node right external iliac region measures 5.1 x 3.1 cm. Right inguinal adenopathy which may be assessable to biopsy measures 2.7 x 2 x 2.7 cm. Partially loculated right-sided  pleural effusion. Pleural extension of tumor posterior inferior right thorax (series 3, images 30 through 36). Confluent atelectasis with air bronchograms right lower lobe right middle lobe. Less confluent atelectasis inferior right upper lobe. No central obstructing mass identified. Tethered pleura with sub pleural thickening greatest lung base. Multiple  tiny bilateral nodules throughout all lung zones. Findings may reflect tumor such as small cell primary lung neoplasm in this smoker. Adenopathy from lymphoma is a possibility. Adenopathy and spread of tumor to lung from other primary tumor not excluded although other primary lesion not identified (evaluation of bowel limited by under distension without obvious mass). Significantly enlarged liver spanning over 25.6 cm. Aortic Atherosclerosis (ICD10-I70.0). Prominent ascending thoracic aorta measuring up to 4 cm transverse dimension. Recommend annual imaging followup by CTA or MRA. This recommendation follows 2010 ACCF/AHA/AATS/ACR/ASA/SCA/SCAI/SIR/STS/SVM Guidelines for the Diagnosis and Management of Patients with Thoracic Aortic Disease. Circulation. 2010; 121: Y301-S010 Exam not tailored to evaluate for pulmonary embolus. No central pulmonary embolus noted. Main pulmonary artery prominent measuring 3.6 cm which may reflect component of pulmonary hypertension. Electronically Signed   By: Genia Del M.D.   On: 08/08/2016 07:45   Mr Thoracic Spine W Wo Contrast  Result Date: 08/10/2016 CLINICAL DATA:  66 year old female with mediastinal lymphadenopathy and shortness of breath. Status post thoracentesis 08/07/2016. Prevertebral soft tissue mass by recent chest CT. EXAM: MRI THORACIC WITHOUT AND WITH CONTRAST TECHNIQUE: Multiplanar and multiecho pulse sequences of the thoracic spine were obtained without and with intravenous contrast. CONTRAST:  20 ml MULTIHANCE GADOBENATE DIMEGLUMINE 529 MG/ML IV SOLN COMPARISON:  CT chest 08/09/2015. FINDINGS: MRI THORACIC SPINE FINDINGS Alignment:  Maintained. Vertebrae: No fracture. STIR imaging demonstrates marrow edema in the T8 and T9 vertebral bodies with corresponding decreased T1 signal most consistent with tumor infiltration. More subtle decreased T1 signal is identified in the T7, T10, T11 and likely T12 vertebral bodies worrisome for tumor infiltration. Cord:   Demonstrates normal signal. Paraspinal and other soft tissues: Right pleural effusion is identified. Large prevertebral tumor mass extending along both the right and left sides of multiple vertebral bodies is identified as seen on prior CT scan. There is extension of tumor along the pleura bilaterally. Large right pleural effusion is noted. Disc levels: Multilevel degenerative disease of the cervical spine is seen in the sagittal plane only. Axial imaging is limited but there is epidural tumor extending from approximately T7 through T11 as seen on the sagittal postcontrast images. Tumor bulk is largest from mid T8 to upper T10 where it is eccentrically prominent to the right. Epidural tumor on the right and anteriorly from the T8-9 to the T9-10 levels compresses and deforms the cord. See axial images 14-21 of series 12. No associated cord signal abnormality is identified. Disc bulging is seen in the upper and mid cervical spine but does not cause central canal stenosis. IMPRESSION: Large paraspinal mass as seen on prior CT scan. Abnormal marrow signal from T7-T11 is most conspicuous in T8 and T9 and consistent with tumor. 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 Epidural tumor is best seen on axial images 14-21 of series 12. Right pleural effusion. Multilevel cervical and thoracic spondylosis. Electronically Signed   By: Inge Rise M.D.   On: 08/10/2016 11:27   Ct Abdomen Pelvis W Contrast  Result Date: 08/08/2016 CLINICAL DATA:  66 year old female with mediastinal adenopathy and shortness breath. Post thoracentesis. Cytology pending. Recently quit smoking. Evaluate for  metastatic disease. Post abdominal hysterectomy. Subsequent encounter. EXAM: CT CHEST, ABDOMEN, AND PELVIS WITH CONTRAST TECHNIQUE: Multidetector CT imaging of the chest, abdomen and pelvis was performed following the standard protocol during bolus administration of  intravenous contrast. CONTRAST:  150mL ISOVUE-300 IOPAMIDOL (ISOVUE-300) INJECTION 61% COMPARISON:  08/07/2016 chest x-ray and chest CT. 05/24/2005 CT of the abdomen and pelvis. FINDINGS: CT CHEST FINDINGS Cardiovascular: Heart size within normal limits. Prominent mitral valve calcifications. Trace coronary artery calcifications. Prominent ascending thoracic aorta measuring up to 4 cm transverse dimension. Exam not tailored to evaluate for pulmonary embolus. No central pulmonary embolus noted. Main pulmonary artery prominent measuring 3.6 cm which may reflect component of pulmonary hypertension. 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 Question epidural extension of tumor versus engorged epidural veins. Contrast-enhanced MR can be obtained for further delineation of clinically desired. Lungs/Pleura: Partially loculated right-sided pleural effusion. Pleural extension of tumor posterior inferior right thorax (series 3, images 30 through 36). Confluent atelectasis with air bronchograms right lower lobe right middle lobe. Less confluent atelectasis inferior right upper lobe. No central obstructing mass identified. Tethered pleura with sub pleural thickening greatest lung base. Multiple tiny bilateral nodules throughout all lung zones. Accessory fissure left upper lobe incidentally noted. Trachea and mainstem bronchi are patent. Musculoskeletal: Although thoracic vertebra surrounded by mass, no obvious destruction. Degenerative changes throughout the lower cervical and thoracic spine. CT ABDOMEN PELVIS FINDINGS Hepatobiliary: Significantly enlarged liver spanning over 25.6 cm without focal hepatic lesion noted. No calcified gallstones. Pancreas: No primary pancreatic mass or inflammation. Spleen: No splenic mass or  enlargement. Adrenals/Urinary Tract: No hydronephrosis. Left lower pole 1 cm low-density structure statistically likely a cyst. No adrenal mass. Noncontrast filled views of the urinary bladder with limited evaluation secondary to habitus/ artifact. It would be difficult on the present examination to exclude a primary bladder abnormality. Stomach/Bowel: Diverticulosis with regions of muscular hypertrophy. No extraluminal bowel inflammatory process. Limited for evaluating for possibility of bowel/ stomach primary mass secondary to under distension. No obvious abnormality noted. Vascular/Lymphatic: Retroperitoneal, upper abdominal, mesenteric, pelvic and right inguinal adenopathy. Index lymph node right external iliac region measures 5.1 x 3.1 cm. Right inguinal adenopathy which may be assessable to biopsy measures 2.7 x 2 x 2.7 cm. Trace abdominal aortic calcification without aneurysm. No large arterial vessel occlusion. Reproductive: Post hysterectomy.  No obvious adnexal mass. Other: No free intraperitoneal air. Injection granulomas and subcutaneous gas from injection. Musculoskeletal: No destructive lesion identified. Degenerative changes most prominent L4-5 level with facet degenerative changes, mild anterior slip L4 and bulge contribute to multifactorial spinal stenosis. 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 Question epidural extension of tumor versus engorged epidural veins. Contrast-enhanced MR can be obtained for further delineation of clinically desired. Abdominal region retroperitoneal upper abdominal, mesenteric, pelvic and right inguinal adenopathy. Index lymph node right external iliac region measures 5.1 x 3.1 cm. Right inguinal adenopathy which may be assessable to biopsy measures 2.7 x 2 x 2.7 cm.  Partially loculated right-sided pleural effusion. Pleural extension of tumor posterior inferior right thorax (series 3, images 30 through 36). Confluent atelectasis with air bronchograms right lower lobe right middle lobe. Less confluent atelectasis inferior right upper lobe. No central obstructing mass identified.  Tethered pleura with sub pleural thickening greatest lung base. Multiple tiny bilateral nodules throughout all lung zones. Findings may reflect tumor such as small cell primary lung neoplasm in this smoker. Adenopathy from lymphoma is a possibility. Adenopathy and spread of tumor to lung from other primary tumor not excluded although other primary lesion not identified (evaluation of bowel limited by under distension without obvious mass). Significantly enlarged liver spanning over 25.6 cm. Aortic Atherosclerosis (ICD10-I70.0). Prominent ascending thoracic aorta measuring up to 4 cm transverse dimension. Recommend annual imaging followup by CTA or MRA. This recommendation follows 2010 ACCF/AHA/AATS/ACR/ASA/SCA/SCAI/SIR/STS/SVM Guidelines for the Diagnosis and Management of Patients with Thoracic Aortic Disease. Circulation. 2010; 121: Q595-G387 Exam not tailored to evaluate for pulmonary embolus. No central pulmonary embolus noted. Main pulmonary artery prominent measuring 3.6 cm which may reflect component of pulmonary hypertension. Electronically Signed   By: Genia Del M.D.   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 EXAM: NUCLEAR MEDICINE PET SKULL BASE TO THIGH TECHNIQUE: 12.5 mCi F-18 FDG was injected intravenously. Full-ring PET imaging was performed from the skull base to thigh after the radiotracer. CT data was obtained and used for attenuation correction and anatomic localization. FASTING BLOOD GLUCOSE:  Value: 86 mg/dl COMPARISON:  CT chest abdomen pelvis  08/08/2016. FINDINGS: NECK: No hypermetabolic lymph nodes in the neck. CT images show no acute findings. CHEST: Hypermetabolic paraspinal mass measures 8.6 x 9.4 cm with an SUV max of 17.7. Pleural nodules in the right hemithorax are hypermetabolic. Index nodule along the posteromedial mid right hemithorax measures 1.3 x 3.0 cm (CT image 62) with an SUV max of 10.2. Juxta diaphragmatic lymph node measures 12 mm with an SUV max of 8.4. No hypermetabolic mediastinal, hilar or axillary lymph nodes. No pericardial effusion. Moderate right pleural effusion with collapse/ consolidation in the right lower lobe, similar to 08/08/2016. ABDOMEN/PELVIS: No abnormal hypermetabolism in the liver, adrenal glands, spleen or pancreas. Gastrohepatic ligament lymph node measures 12 mm with an SUV max of 4.9. Right external iliac lymph nodes measure up to 2.4 cm in short axis with an SUV max of 9.3. Right inguinal lymph nodes measure up to 1.6 cm with an SUV max of 10.0. No acute findings in the abdomen or pelvis in the interval from 08/08/2016. SKELETON: Mild scattered patchy skeletal uptake. There is a focal area of abnormal hypermetabolism in the proximal left humeral shaft, with an SUV max of 10.8. Abnormal hypermetabolism is seen in the T7 and T8 vertebral bodies without a definitive CT correlate. It is seen at the level of the above-described large hypermetabolic paraspinal soft tissue mass. IMPRESSION: 1. Hypermetabolic intrathoracic paraspinal soft tissue mass with hypermetabolic right pleural nodularity and hypermetabolic lymph nodes in the lower chest, abdomen and pelvis, consistent with the given history of lymphoma. 2. Patchy osseous uptake with focal hypermetabolism in the proximal left humerus as well as T7 and T8 vertebral bodies, worrisome for lymphomatous involvement. 3. Moderate right pleural effusion with collapse/consolidation in the right lower lobe, similar to 08/08/2016. Electronically Signed   By: Lorin Picket M.D.   On: 08/19/2016 11:33   US Biopsy  Result Date: 08/09/2016 INDICATION: 66 year old female with newly diagnosed multifocal lymphadenopathy highly concerning for malignant lymphoma. EXAM: Ultrasound-guided core biopsy right inguinal lymph node MEDICATIONS: None. ANESTHESIA/SEDATION: Moderate (conscious) sedation was employed during this procedure. A total of Versed 1.5 mg and  Fentanyl 75 mcg was administered intravenously. Moderate Sedation Time: 13 minutes. The patient's level of consciousness and vital signs were monitored continuously by radiology nursing throughout the procedure under my direct supervision. FLUOROSCOPY TIME:  Fluoroscopy Time: 0 minutes 0 seconds (0 mGy). COMPLICATIONS: None immediate. PROCEDURE: Informed written consent was obtained from the patient after a thorough discussion of the procedural risks, benefits and alternatives. All questions were addressed. A timeout was performed prior to the initiation of the procedure. The right inguinal region was interrogated with ultrasound. A hypoechoic lymph node with internal vascularity measures 2.7 x 1.4 cm. A suitable skin entry site was selected and marked. Following sterile prep and drape in the standard fashion with chlorhexidine skin prep, local anesthesia was attained by infiltration with 1% lidocaine. A small dermatotomy was made. Using real-time sonographic guidance, multiple 16 gauge core biopsies were obtained using the Bard automated biopsy device. Biopsy specimens were placed in saline and delivered to pathology for further analysis. Post biopsy ultrasound imaging demonstrates no evidence of active bleeding or complication. IMPRESSION: Technically successful core biopsy of abnormal right inguinal lymph node. Signed, Criselda Peaches, MD [facility] Radiology Electronically Signed   By: Jacqulynn Cadet M.D.   On: 08/09/2016 13:12   Ir US Guide Vasc Access Right  Result  Date: 08/21/2016 INDICATION: 66 year old female with a history of non-Hodgkin's lymphoma EXAM: IMPLANTED PORT A CATH PLACEMENT WITH ULTRASOUND AND FLUOROSCOPIC GUIDANCE MEDICATIONS: 2.0 g Ancef; The antibiotic was administered within an appropriate time interval prior to skin puncture. ANESTHESIA/SEDATION: Moderate (conscious) sedation was employed during this procedure. A total of Versed 2.0 mg and Fentanyl 50 mcg was administered intravenously. Moderate Sedation Time: 27 minutes. The patient's level of consciousness and vital signs were monitored continuously by radiology nursing throughout the procedure under my direct supervision. FLUOROSCOPY TIME:  Zero minutes, 24 seconds (4.2 mGy) COMPLICATIONS: None PROCEDURE: The procedure, risks, benefits, and alternatives were explained to the patient. Questions regarding the procedure were encouraged and answered. The patient understands and consents to the procedure. Ultrasound survey was performed with images stored and sent to PACs. The right neck and chest was prepped with chlorhexidine, and draped in the usual sterile fashion using maximum barrier technique (cap and mask, sterile gown, sterile gloves, large sterile sheet, hand hygiene and cutaneous antiseptic). Antibiotic prophylaxis was provided with 2.0g Ancef administered IV one hour prior to skin incision. Local anesthesia was attained by infiltration with 1% lidocaine without epinephrine. Ultrasound demonstrated patency of the right internal jugular vein, and this was documented with an image. Under real-time ultrasound guidance, this vein was accessed with a 21 gauge micropuncture needle and image documentation was performed. A small dermatotomy was made at the access site with an 11 scalpel. A 0.018" wire was advanced into the SVC and used to estimate the length of the internal catheter. The access needle exchanged for a 50F micropuncture vascular sheath. The 0.018" wire was then removed and a 0.035" wire  advanced into the IVC. An appropriate location for the subcutaneous reservoir was selected below the clavicle and an incision was made through the skin and underlying soft tissues. The subcutaneous tissues were then dissected using a combination of blunt and sharp surgical technique and a pocket was formed. A single lumen power injectable portacatheter was then tunneled through the subcutaneous tissues from the pocket to the dermatotomy and the port reservoir placed within the subcutaneous pocket. The venous access site was then serially dilated and a peel away  vascular sheath placed over the wire. The wire was removed and the port catheter advanced into position under fluoroscopic guidance. The catheter tip is positioned in the cavoatrial junction. This was documented with a spot image. The portacatheter was then tested and found to flush and aspirate well. The port was flushed with saline followed by 100 units/mL heparinized saline. The pocket was then closed in two layers using first subdermal inverted interrupted absorbable sutures followed by a running subcuticular suture. The epidermis was then sealed with Dermabond. The dermatotomy at the venous access site was also seal with Dermabond. Patient tolerated the procedure well and remained hemodynamically stable throughout. No complications encountered and no significant blood loss encountered IMPRESSION: Status post right IJ port catheter placement. Catheter ready for use. Signed, Dulcy Fanny. Earleen Newport, DO [facility] Radiology Electronically Signed   By: Corrie Mckusick D.O.   On: 08/18/2016 15:53   Dg Chest Port 1 View  Result Date: 08/08/2016 CLINICAL DATA:  Respiratory failure EXAM: PORTABLE CHEST 1 VIEW COMPARISON:  08/07/2016 FINDINGS: Right pleural effusion and right lower lobe airspace disease unchanged. Left lung remains clear. Heart size normal. Negative for heart failure. IMPRESSION: Right lower lobe  airspace disease and right pleural effusion unchanged. Electronically Signed   By: Franchot Gallo M.D.   On: 08/08/2016 07:46   Ir Fluoro Guide Port Insertion Right  Result Date: 08/18/2016 INDICATION: 66 year old female with a history of non-Hodgkin's lymphoma EXAM: IMPLANTED PORT A CATH PLACEMENT WITH ULTRASOUND AND FLUOROSCOPIC GUIDANCE MEDICATIONS: 2.0 g Ancef; The antibiotic was administered within an appropriate time interval prior to skin puncture. ANESTHESIA/SEDATION: Moderate (conscious) sedation was employed during this procedure. A total of Versed 2.0 mg and Fentanyl 50 mcg was administered intravenously. Moderate Sedation Time: 27 minutes. The patient's level of consciousness and vital signs were monitored continuously by radiology nursing throughout the procedure under my direct supervision. FLUOROSCOPY TIME:  Zero minutes, 24 seconds (4.2 mGy) COMPLICATIONS: None PROCEDURE: The procedure, risks, benefits, and alternatives were explained to the patient. Questions regarding the procedure were encouraged and answered. The patient understands and consents to the procedure. Ultrasound survey was performed with images stored and sent to PACs. The right neck and chest was prepped with chlorhexidine, and draped in the usual sterile fashion using maximum barrier technique (cap and mask, sterile gown, sterile gloves, large sterile sheet, hand hygiene and cutaneous antiseptic). Antibiotic prophylaxis was provided with 2.0g Ancef administered IV one hour prior to skin incision. Local anesthesia was attained by infiltration with 1% lidocaine without epinephrine. Ultrasound demonstrated patency of the right internal jugular vein, and this was documented with an image. Under real-time ultrasound guidance, this vein was accessed with a 21 gauge micropuncture needle and image documentation was performed. A small dermatotomy was made at the access site with an 11 scalpel. A 0.018" wire was advanced into the SVC and  used to estimate the length of the internal catheter. The access needle exchanged for a 73F micropuncture vascular sheath. The 0.018" wire was then removed and a 0.035" wire advanced into the IVC. An appropriate location for the subcutaneous reservoir was selected below the clavicle and an incision was made through the skin and underlying soft tissues. The subcutaneous tissues were then dissected using a combination of blunt and sharp surgical technique and a pocket was formed. A single lumen power injectable portacatheter was then tunneled through the subcutaneous tissues from the pocket to the dermatotomy and the port reservoir placed within the subcutaneous pocket.  The venous access site was then serially dilated and a peel away vascular sheath placed over the wire. The wire was removed and the port catheter advanced into position under fluoroscopic guidance. The catheter tip is positioned in the cavoatrial junction. This was documented with a spot image. The portacatheter was then tested and found to flush and aspirate well. The port was flushed with saline followed by 100 units/mL heparinized saline. The pocket was then closed in two layers using first subdermal inverted interrupted absorbable sutures followed by a running subcuticular suture. The epidermis was then sealed with Dermabond. The dermatotomy at the venous access site was also seal with Dermabond. Patient tolerated the procedure well and remained hemodynamically stable throughout. No complications encountered and no significant blood loss encountered IMPRESSION: Status post right IJ port catheter placement. Catheter ready for use. Signed, Dulcy Fanny. Earleen Newport, DO [facility] Radiology Electronically Signed   By: Corrie Mckusick D.O.   On: 08/18/2016 15:53   Ir Thoracentesis Asp Pleural Space W/img Guide  Result Date: 08/14/2016 INDICATION: Shortness of breath. Recurrent right pleural effusion. Request  therapeutic thoracentesis. EXAM: ULTRASOUND GUIDED RIGHT THORACENTESIS MEDICATIONS: None. COMPLICATIONS: None immediate. Postprocedural chest x-ray negative for pneumothorax. PROCEDURE: An ultrasound guided thoracentesis was thoroughly discussed with the patient and questions answered. The benefits, risks, alternatives and complications were also discussed. The patient understands and wishes to proceed with the procedure. Written consent was obtained. Ultrasound was performed to localize and mark an adequate pocket of fluid in the right chest. The area was then prepped and draped in the normal sterile fashion. 1% Lidocaine was used for local anesthesia. Under ultrasound guidance a Safe-T-Centesis catheter was introduced. Thoracentesis was performed. The catheter was removed and a dressing applied. FINDINGS: A total of approximately 840 mL of clear, amber colored fluid was removed. IMPRESSION: Successful ultrasound guided right thoracentesis yielding 840 mL of pleural fluid. Read by: Ascencion Dike PA-C Electronically Signed   By: Markus Daft M.D.   On: 08/14/2016 11:30   Ir Thoracentesis Asp Pleural Space W/img Guide  Result Date: 08/07/2016 INDICATION: Patient admitted with cough, dyspnea, orthopnea. Imaging reveals a a prevertebral soft tissue abnormality consistent with a mass as well as bilateral nodular densities in the upper lobes of the lungs. A moderate right pleural effusion is noted as well. Request has been made for diagnostic and therapeutic thoracentesis. EXAM: ULTRASOUND GUIDED DIAGNOSTIC AND THERAPEUTIC THORACENTESIS MEDICATIONS: 1% xylocaine COMPLICATIONS: None immediate. PROCEDURE: An ultrasound guided thoracentesis was thoroughly discussed with the patient and questions answered. The benefits, risks, alternatives and complications were also discussed. The patient understands and wishes to proceed with the procedure. Written consent was obtained. Ultrasound was performed to localize and mark an  adequate pocket of fluid in the right chest. The area was then prepped and draped in the normal sterile fashion. 1% Lidocaine was used for local anesthesia. Under ultrasound guidance a Safe-T-Centesis catheter was introduced. Thoracentesis was performed. The catheter was removed and a dressing applied. FINDINGS: A total of approximately 0.7 L of serosanguineous fluid was removed. Samples were sent to the laboratory as requested by the clinical team. IMPRESSION: Successful ultrasound guided right thoracentesis yielding 0.7 L of pleural fluid. Postprocedure chest radiograph shows no pneumothorax. Read by: Saverio Danker, PA-C Electronically Signed   By: Lucrezia Europe M.D.   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  #2 Recurrent right pleural effusion likely related to lymphoma possibly from decreased lymphatic drainage.  #3 Back pain with large paraspinal mass in the thoracic region with possible epidural extension. No overt neurological symptoms related to this. Neurosurgery was consulted and recommended no interventions.  Plan Labs stable Chemo-counseling completed and informed consent obtained. PET/CT images and results reviewed with patient and husband in clinic Patient appropriate to proceed with 1st cycle of R-CHOP with G-CSF support. RTC with Dr Irene Limbo in 8-10 days for toxicity check with labs   #4 . Patient Active Problem List   Diagnosis Date Noted  . DLBCL (diffuse large B cell lymphoma) ([place]) 08/17/2016  . Acute midline thoracic back pain   . Lymphadenopathy   . Morbid obesity with BMI of 50.0-59.9, adult ([place])   . NHL (non-Hodgkin's lymphoma) ([place])   . Abdominal pain, RUQ   . Pleural effusion on  right 08/07/2016  . Abnormal EKG 08/07/2016  . 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 Anemia 08/07/2016  . GERD (gastroesophageal reflux disease) 08/07/2016  . Shortness of breath 08/07/2016   -continue f/u with PCP on discharge  -would recommend outpatient sleep study to evaluate for sleep apnea.   Please schedule 2nd and third cycle of R CHOP with Neulasta. Return to clinic with Dr. Irene Limbo in 10 days with repeat labs Return to clinic with Dr. Irene Limbo cycle 2 day 1 in 3 weeks with labs.   I spent 30 minutes counseling the patient face to face. The total time spent in the appointment was 40 minutes and more than 50% was on counseling and direct patient cares.    Sullivan Lone MD [place] AAHIVMS [facility] Hematology/Oncology Physician [facility]  (Office):       [phone] (Work cell):  [phone] (Fax):           [phone]

## 2016-08-28 NOTE — Telephone Encounter (Signed)
Chemo f/u phone call. First-time RCHOP 08/22/16. Pt experiencing some appetite loss and slight decrease in intake. Also c/o trouble sleeping, but believes it is just a mental thing. Wants to take Vitamin B12, but already taking Multivitamin. Told pt to look at list of vitamins/minerals and see if Vitamin B is on the label. If purchasing additional Vitamin B12 supplement to discuss with pharmacist at drug store before taking both. Pt verbalized understanding. Next appt 09/01/16.

## 2016-09-01 ENCOUNTER — Encounter: Payer: Self-pay | Admitting: Hematology

## 2016-09-01 ENCOUNTER — Encounter (INDEPENDENT_AMBULATORY_CARE_PROVIDER_SITE_OTHER): Payer: Self-pay

## 2016-09-01 ENCOUNTER — Other Ambulatory Visit (HOSPITAL_BASED_OUTPATIENT_CLINIC_OR_DEPARTMENT_OTHER): Payer: Medicare Other

## 2016-09-01 ENCOUNTER — Telehealth: Payer: Self-pay | Admitting: Hematology

## 2016-09-01 ENCOUNTER — Ambulatory Visit (HOSPITAL_BASED_OUTPATIENT_CLINIC_OR_DEPARTMENT_OTHER): Payer: Medicare Other | Admitting: Hematology

## 2016-09-01 VITALS — BP 150/93 | HR 64 | Temp 98.7°F | Resp 18 | Ht 61.0 in | Wt 289.8 lb

## 2016-09-01 DIAGNOSIS — M549 Dorsalgia, unspecified: Secondary | ICD-10-CM

## 2016-09-01 DIAGNOSIS — C8338 Diffuse large B-cell lymphoma, lymph nodes of multiple sites: Secondary | ICD-10-CM

## 2016-09-01 DIAGNOSIS — Z95828 Presence of other vascular implants and grafts: Secondary | ICD-10-CM

## 2016-09-01 DIAGNOSIS — C8330 Diffuse large B-cell lymphoma, unspecified site: Secondary | ICD-10-CM

## 2016-09-01 LAB — CBC & DIFF AND RETIC
BASO%: 0.2 % (ref 0.0–2.0)
BASOS ABS: 0 10*3/uL (ref 0.0–0.1)
EOS ABS: 0 10*3/uL (ref 0.0–0.5)
EOS%: 0 % (ref 0.0–7.0)
HCT: 37.5 % (ref 34.8–46.6)
HEMOGLOBIN: 12 g/dL (ref 11.6–15.9)
Immature Retic Fract: 19.6 % — ABNORMAL HIGH (ref 1.60–10.00)
LYMPH%: 10.4 % — ABNORMAL LOW (ref 14.0–49.7)
MCH: 27.4 pg (ref 25.1–34.0)
MCHC: 32 g/dL (ref 31.5–36.0)
MCV: 85.6 fL (ref 79.5–101.0)
MONO#: 0.4 10*3/uL (ref 0.1–0.9)
MONO%: 8.3 % (ref 0.0–14.0)
NEUT#: 3.5 10*3/uL (ref 1.5–6.5)
NEUT%: 81.1 % — ABNORMAL HIGH (ref 38.4–76.8)
Platelets: 147 10*3/uL (ref 145–400)
RBC: 4.38 10*6/uL (ref 3.70–5.45)
RDW: 14 % (ref 11.2–14.5)
RETIC %: 1.57 % (ref 0.70–2.10)
RETIC CT ABS: 68.77 10*3/uL (ref 33.70–90.70)
WBC: 4.3 10*3/uL (ref 3.9–10.3)
lymph#: 0.5 10*3/uL — ABNORMAL LOW (ref 0.9–3.3)

## 2016-09-01 LAB — COMPREHENSIVE METABOLIC PANEL
ALK PHOS: 64 U/L (ref 40–150)
ALT: 72 U/L — AB (ref 0–55)
ANION GAP: 7 meq/L (ref 3–11)
AST: 35 U/L — ABNORMAL HIGH (ref 5–34)
Albumin: 3.2 g/dL — ABNORMAL LOW (ref 3.5–5.0)
BILIRUBIN TOTAL: 0.28 mg/dL (ref 0.20–1.20)
BUN: 8.8 mg/dL (ref 7.0–26.0)
CALCIUM: 9.4 mg/dL (ref 8.4–10.4)
CO2: 24 meq/L (ref 22–29)
CREATININE: 0.8 mg/dL (ref 0.6–1.1)
Chloride: 105 mEq/L (ref 98–109)
EGFR: 84 mL/min/{1.73_m2} — AB (ref >90)
Glucose: 131 mg/dl (ref 70–140)
Potassium: 3.8 mEq/L (ref 3.5–5.1)
Sodium: 137 mEq/L (ref 136–145)
TOTAL PROTEIN: 6.6 g/dL (ref 6.4–8.3)

## 2016-09-01 LAB — URIC ACID: Uric Acid, Serum: 2 mg/dl — ABNORMAL LOW (ref 2.6–7.4)

## 2016-09-01 NOTE — Telephone Encounter (Signed)
Scheduled appt per 8/31 los - left message for patient and sent reminder letter in the mail.

## 2016-09-01 NOTE — Patient Instructions (Signed)
Thank you for choosing [facility] to provide your oncology and hematology care.  To afford each patient quality time with our providers, please arrive 30 minutes before your scheduled appointment time.  If you arrive late for your appointment, you may be asked to reschedule.  We strive to give you quality time with our providers, and arriving late affects you and other patients whose appointments are after yours.  If you are a no show for multiple scheduled visits, you may be dismissed from the clinic at the providers discretion.   Again, thank you for choosing [facility], our hope is that these requests will decrease the amount of time that you wait before being seen by our physicians.  ______________________________________________________________________ Should you have questions after your visit to the [facility], please contact our office at [phone] between the hours of 8:30 and 4:30 p.m.    Voicemails left after 4:30p.m will not be returned until the following business day.   For prescription refill requests, please have your pharmacy contact us directly.  Please also try to allow 48 hours for prescription requests.   Please contact the scheduling department for questions regarding scheduling.  For scheduling of procedures such as PET scans, CT scans, MRI, Ultrasound, etc please contact central scheduling at [phone].   Resources For Cancer Patients and Caregivers:  American Cancer Society:  800-227-2345  Can help patients locate various types of support and financial assistance Cancer Care: 1-800-813-HOPE (4673) Provides financial assistance, online support groups, medication/co-pay assistance.   [place] DSS:  336-641-3447 Where to apply for food stamps, Medicaid, and utility assistance Medicare Rights Center: 800-333-4114 Helps people with Medicare understand their rights and benefits, navigate the Medicare system, and secure the  quality healthcare they deserve SCAT: 336-333-6589 [place] [employer]'s shared-ride transportation service for eligible riders who have a disability that prevents them from riding the fixed route bus.   For additional information on assistance programs please contact our social worker:   Grier Hock/Abigail Elmore:  [phone] 

## 2016-09-08 ENCOUNTER — Other Ambulatory Visit: Payer: Self-pay | Admitting: Hematology

## 2016-09-08 DIAGNOSIS — C8338 Diffuse large B-cell lymphoma, lymph nodes of multiple sites: Secondary | ICD-10-CM

## 2016-09-10 NOTE — Progress Notes (Signed)
Jo Collins  HEMATOLOGY ONCOLOGY PROGRESS NOTE  Date of service: .09/01/2016  Patient Care Team: Deland Pretty, MD as PCP - General (Internal Medicine)  CC: post hospitalization f/u for management of newly diagnosed diffuse large B cell lymphoma  Diagnosis: Diffuse large B cell lymphoma  Current Treatment: R-CHOP with G-CSF support  INTERVAL HISTORY:  Patient is here for f/u for DLBCL and toxcity check. She notes back pain and breathing improved with dexamethasone. No nausea/vomiting. Notes decreased leg swelling. No significant body aches. No other prohibitive toxicities. 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 We discussed the increased risk of CNS recurrence with her lymphoma given paraspinal presentation/stages etc and discussed consideration of CNS prophylaxis -- she wants to think about this.  REVIEW OF SYSTEMS:    10 Point review of systems of done and is negative except as noted above.  . Past Medical History:  Diagnosis Date  . GERD (gastroesophageal reflux disease)   . NHL (non-Hodgkin's lymphoma) ([place])     . Past Surgical History:  Procedure Laterality Date  . ABDOMINAL HYSTERECTOMY    . IR FLUORO GUIDE PORT INSERTION RIGHT  08/18/2016  . IR THORACENTESIS ASP PLEURAL SPACE W/IMG GUIDE  08/07/2016  . IR THORACENTESIS ASP PLEURAL SPACE W/IMG GUIDE  08/14/2016  . IR US GUIDE VASC ACCESS RIGHT  08/18/2016    . Social History  Substance Use Topics  . Smoking status: Former Smoker    Packs/day: 0.50    Years: 47.00    Types: Cigarettes    Quit date: 07/28/2016  . Smokeless tobacco: Never Used  . Alcohol use No    ALLERGIES:  has No Known Allergies.  MEDICATIONS:  Current Outpatient Prescriptions  Medication Sig Dispense Refill  . allopurinol (ZYLOPRIM) 100 MG tablet Take 1 tablet (100 mg total) by mouth 2 (two) times daily. 30 tablet 0  . dexamethasone (DECADRON) 4 MG tablet Take 2 tablets (8 mg total) by mouth  daily. 15 tablet 0  . lidocaine-prilocaine (EMLA) cream Apply to affected area once 30 g 3  . lidocaine-prilocaine (EMLA) cream Apply 1 application topically as needed. Apply to port-a-cath assess site 45-60 mins prior to chemotherapy. 30 g 6  . magnesium oxide (MAG-OX) 400 MG tablet Take 400 mg by mouth daily.    . Multiple Vitamin (MULTIVITAMIN WITH MINERALS) TABS tablet Take 1 tablet by mouth daily.    . ondansetron (ZOFRAN) 8 MG tablet Take 1 tablet (8 mg total) by mouth 2 (two) times daily as needed for refractory nausea / vomiting. Start on day 3 after cyclophosphamide rx 30 tablet 1  . oxyCODONE-acetaminophen (PERCOCET/ROXICET) 5-325 MG tablet Take 2 tablets by mouth every 4 (four) hours as needed for moderate pain. 30 tablet 0  . polyethylene glycol (MIRALAX / GLYCOLAX) packet Take 17 g by mouth daily. (Patient not taking: Reported on 08/22/2016) 14 each 0  . predniSONE (DELTASONE) 20 MG tablet Take 3 tablets (60 mg total) by mouth daily. Take on days 1-5 of chemotherapy. 20 tablet 5  . prochlorperazine (COMPAZINE) 10 MG tablet Take 1 tablet (10 mg total) by mouth every 6 (six) hours as needed (Nausea or vomiting). 30 tablet 6  . vitamin B-12 500 MCG tablet Take 1 tablet (500 mcg total) by mouth daily. 30 tablet 0   No current facility-administered medications for this visit.     PHYSICAL EXAMINATION: ECOG PERFORMANCE STATUS: 1 - Symptomatic but completely ambulatory  . Vitals:  09/01/16 0920  BP: (!) 150/93  Pulse: 64  Resp: 18  Temp: 98.7 F (37.1 C)  SpO2: 99%    Filed Weights   09/01/16 0920  Weight: 289 lb 12.8 oz (131.5 kg)   .Body mass index is 54.76 kg/m.  GENERAL:alert, in no acute distress and comfortable SKIN: no acute rashes, no significant lesions EYES: conjunctiva are pink and non-injected, sclera anicteric OROPHARYNX: MMM, no exudates, no oropharyngeal erythema or ulceration NECK: supple, no JVD LYMPH:  no palpable lymphadenopathy in the cervical,  axillary or inguinal regions LUNGS: clear to auscultation b/l with normal respiratory effort HEART: regular rate & rhythm ABDOMEN:  normoactive bowel sounds , non tender, not distended. Extremity: trace pedal edema PSYCH: alert & oriented x 3 with fluent speech NEURO: no focal motor/sensory deficits  LABORATORY DATA:   I have reviewed the data as listed  . CBC Latest Ref Rng & Units 09/01/2016 08/22/2016 08/18/2016  WBC 3.9 - 10.3 10e3/uL 4.3 11.4(H) 8.9  Hemoglobin 11.6 - 15.9 g/dL 12.0 11.3(L) 11.0(L)  Hematocrit 34.8 - 46.6 % 37.5 35.7 35.0(L)  Platelets 145 - 400 10e3/uL 147 221 250    . CMP Latest Ref Rng & Units 09/01/2016 08/22/2016 08/18/2016  Glucose 70 - 140 mg/dl 131 101 95  BUN 7.0 - 26.0 mg/dL 8.8 19.4 14  Creatinine 0.6 - 1.1 mg/dL 0.8 0.9 1.07(H)  Sodium 136 - 145 mEq/L 137 140 143  Potassium 3.5 - 5.1 mEq/L 3.8 4.0 3.4(L)  Chloride 101 - 111 mmol/L - - 105  CO2 22 - 29 mEq/L 24 27 30   Calcium 8.4 - 10.4 mg/dL 9.4 8.9 8.6(L)  Total Protein 6.4 - 8.3 g/dL 6.6 6.2(L) -  Total Bilirubin 0.20 - 1.20 mg/dL 0.28 0.30 -  Alkaline Phos 40 - 150 U/L 64 46 -  AST 5 - 34 U/L 35(H) 14 -  ALT 0 - 55 U/L 72(H) 27 -   . Lab Results  Component Value Date   LDH 263 (H) 08/22/2016     RADIOGRAPHIC STUDIES: I have personally reviewed the radiological images as listed and agreed with the findings in the report. Dg Chest 1 View  Result Date: 08/14/2016 CLINICAL DATA:  Status post right-sided thoracentesis EXAM: CHEST 1 VIEW COMPARISON:  Chest x-ray dated August 13, 2016 FINDINGS: The left lung is adequately inflated. On the right there is improved aeration at the lung base since thoracentesis. The volume of pleural fluid has decreased somewhat. No postprocedure pneumothorax is observed. The cardiac silhouette remains enlarged. The central pulmonary vascularity is prominent but may be accentuated by patient positioning. IMPRESSION: No postprocedure pneumothorax following right-sided  thoracentesis. Some improved aeration of the right lung is seen. Electronically Signed   By: David  Martinique M.D.   On: 08/14/2016 11:20   Dg Chest 2 View  Result Date: 08/13/2016 CLINICAL DATA:  Right-sided pleural effusion. EXAM: CHEST  2 VIEW COMPARISON:  MRI of the thoracic spine 08/10/2016. Chest x-ray and CT 08/08/16. FINDINGS: Heart size normal. A right paraspinal mass is again noted. A right pleural effusion is increasing. No significant consolidation is present on the left. IMPRESSION: 1. Increasing right-sided pleural effusion and airspace opacity. 2. Right paraspinal mass. Electronically Signed   By: San Morelle M.D.   On: 08/13/2016 14:35   Nm Pet Image Initial (pi) Skull Base To Thigh  Result Date: 08/19/2016 CLINICAL DATA:  Initial treatment strategy for diffuse large B-cell lymphoma. EXAM: NUCLEAR MEDICINE PET SKULL BASE TO THIGH TECHNIQUE: 12.5 mCi F-18  FDG was injected intravenously. Full-ring PET imaging was performed from the skull base to thigh after the radiotracer. CT data was obtained and used for attenuation correction and anatomic localization. FASTING BLOOD GLUCOSE:  Value: 86 mg/dl COMPARISON:  CT chest abdomen pelvis 08/08/2016. FINDINGS: NECK: No hypermetabolic lymph nodes in the neck. CT images show no acute findings. CHEST: Hypermetabolic paraspinal mass measures 8.6 x 9.4 cm with an SUV max of 17.7. Pleural nodules in the right hemithorax are hypermetabolic. Index nodule along the posteromedial mid right hemithorax measures 1.3 x 3.0 cm (CT image 62) with an SUV max of 10.2. Juxta diaphragmatic lymph node measures 12 mm with an SUV max of 8.4. No hypermetabolic mediastinal, hilar or axillary lymph nodes. No pericardial effusion. Moderate right pleural effusion with collapse/ consolidation in the right lower lobe, similar to 08/08/2016. ABDOMEN/PELVIS: No abnormal hypermetabolism in the liver, adrenal glands, spleen or pancreas. Gastrohepatic ligament lymph node measures  12 mm with an SUV max of 4.9. Right external iliac lymph nodes measure up to 2.4 cm in short axis with an SUV max of 9.3. Right inguinal lymph nodes measure up to 1.6 cm with an SUV max of 10.0. No acute findings in the abdomen or pelvis in the interval from 08/08/2016. SKELETON: Mild scattered patchy skeletal uptake. There is a focal area of abnormal hypermetabolism in the proximal left humeral shaft, with an SUV max of 10.8. Abnormal hypermetabolism is seen in the T7 and T8 vertebral bodies without a definitive CT correlate. It is seen at the level of the above-described large hypermetabolic paraspinal soft tissue mass. IMPRESSION: 1. Hypermetabolic intrathoracic paraspinal soft tissue mass with hypermetabolic right pleural nodularity and hypermetabolic lymph nodes in the lower chest, abdomen and pelvis, consistent with the given history of lymphoma. 2. Patchy osseous uptake with focal hypermetabolism in the proximal left humerus as well as T7 and T8 vertebral bodies, worrisome for lymphomatous involvement. 3. Moderate right pleural effusion with collapse/consolidation in the right lower lobe, similar to 08/08/2016. Electronically Signed   By: Lorin Picket M.D.   On: 08/19/2016 11:33   Ir US Guide Vasc Access Right  Result Date: 08/21/2016 INDICATION: 66 year old female with a history of non-Hodgkin's lymphoma EXAM: IMPLANTED PORT A CATH PLACEMENT WITH ULTRASOUND AND FLUOROSCOPIC GUIDANCE MEDICATIONS: 2.0 g Ancef; The antibiotic was administered within an appropriate time interval prior to skin puncture. ANESTHESIA/SEDATION: Moderate (conscious) sedation was employed during this procedure. A total of Versed 2.0 mg and Fentanyl 50 mcg was administered intravenously. Moderate Sedation Time: 27 minutes. The patient's level of consciousness and vital signs were monitored continuously by radiology nursing throughout the procedure under my direct supervision. FLUOROSCOPY TIME:  Zero minutes, 24 seconds (4.2 mGy)  COMPLICATIONS: None PROCEDURE: The procedure, risks, benefits, and alternatives were explained to the patient. Questions regarding the procedure were encouraged and answered. The patient understands and consents to the procedure. Ultrasound survey was performed with images stored and sent to PACs. The right neck and chest was prepped with chlorhexidine, and draped in the usual sterile fashion using maximum barrier technique (cap and mask, sterile gown, sterile gloves, large sterile sheet, hand hygiene and cutaneous antiseptic). Antibiotic prophylaxis was provided with 2.0g Ancef administered IV one hour prior to skin incision. Local anesthesia was attained by infiltration with 1% lidocaine without epinephrine. Ultrasound demonstrated patency of the right internal jugular vein, and this was documented with an image. Under real-time ultrasound guidance, this vein was accessed with a 21 gauge micropuncture needle and image documentation was  performed. A small dermatotomy was made at the access site with an 11 scalpel. A 0.018" wire was advanced into the SVC and used to estimate the length of the internal catheter. The access needle exchanged for a 45F micropuncture vascular sheath. The 0.018" wire was then removed and a 0.035" wire advanced into the IVC. An appropriate location for the subcutaneous reservoir was selected below the clavicle and an incision was made through the skin and underlying soft tissues. The subcutaneous tissues were then dissected using a combination of blunt and sharp surgical technique and a pocket was formed. A single lumen power injectable portacatheter was then tunneled through the subcutaneous tissues from the pocket to the dermatotomy and the port reservoir placed within the subcutaneous pocket. The venous access site was then serially dilated and a peel away vascular sheath placed over the wire. The wire was removed and the port catheter advanced into position under fluoroscopic guidance.  The catheter tip is positioned in the cavoatrial junction. This was documented with a spot image. The portacatheter was then tested and found to flush and aspirate well. The port was flushed with saline followed by 100 units/mL heparinized saline. The pocket was then closed in two layers using first subdermal inverted interrupted absorbable sutures followed by a running subcuticular suture. The epidermis was then sealed with Dermabond. The dermatotomy at the venous access site was also seal with Dermabond. Patient tolerated the procedure well and remained hemodynamically stable throughout. No complications encountered and no significant blood loss encountered IMPRESSION: Status post right IJ port catheter placement. Catheter ready for use. Signed, Dulcy Fanny. Earleen Newport, DO [facility] Radiology Electronically Signed   By: Corrie Mckusick D.O.   On: 08/18/2016 15:53   Ir Fluoro Guide Port Insertion Right  Result Date: 08/18/2016 INDICATION: 66 year old female with a history of non-Hodgkin's lymphoma EXAM: IMPLANTED PORT A CATH PLACEMENT WITH ULTRASOUND AND FLUOROSCOPIC GUIDANCE MEDICATIONS: 2.0 g Ancef; The antibiotic was administered within an appropriate time interval prior to skin puncture. ANESTHESIA/SEDATION: Moderate (conscious) sedation was employed during this procedure. A total of Versed 2.0 mg and Fentanyl 50 mcg was administered intravenously. Moderate Sedation Time: 27 minutes. The patient's level of consciousness and vital signs were monitored continuously by radiology nursing throughout the procedure under my direct supervision. FLUOROSCOPY TIME:  Zero minutes, 24 seconds (4.2 mGy) COMPLICATIONS: None PROCEDURE: The procedure, risks, benefits, and alternatives were explained to the patient. Questions regarding the procedure were encouraged and answered. The patient understands and consents to the procedure. Ultrasound survey was performed with images stored  and sent to PACs. The right neck and chest was prepped with chlorhexidine, and draped in the usual sterile fashion using maximum barrier technique (cap and mask, sterile gown, sterile gloves, large sterile sheet, hand hygiene and cutaneous antiseptic). Antibiotic prophylaxis was provided with 2.0g Ancef administered IV one hour prior to skin incision. Local anesthesia was attained by infiltration with 1% lidocaine without epinephrine. Ultrasound demonstrated patency of the right internal jugular vein, and this was documented with an image. Under real-time ultrasound guidance, this vein was accessed with a 21 gauge micropuncture needle and image documentation was performed. A small dermatotomy was made at the access site with an 11 scalpel. A 0.018" wire was advanced into the SVC and used to estimate the length of the internal catheter. The access needle exchanged for a 45F micropuncture vascular sheath. The 0.018" wire was then removed and a 0.035" wire advanced into the IVC. An appropriate location  for the subcutaneous reservoir was selected below the clavicle and an incision was made through the skin and underlying soft tissues. The subcutaneous tissues were then dissected using a combination of blunt and sharp surgical technique and a pocket was formed. A single lumen power injectable portacatheter was then tunneled through the subcutaneous tissues from the pocket to the dermatotomy and the port reservoir placed within the subcutaneous pocket. The venous access site was then serially dilated and a peel away vascular sheath placed over the wire. The wire was removed and the port catheter advanced into position under fluoroscopic guidance. The catheter tip is positioned in the cavoatrial junction. This was documented with a spot image. The portacatheter was then tested and found to flush and aspirate well. The port was flushed with saline followed by 100 units/mL heparinized saline. The pocket was then closed in two  layers using first subdermal inverted interrupted absorbable sutures followed by a running subcuticular suture. The epidermis was then sealed with Dermabond. The dermatotomy at the venous access site was also seal with Dermabond. Patient tolerated the procedure well and remained hemodynamically stable throughout. No complications encountered and no significant blood loss encountered IMPRESSION: Status post right IJ port catheter placement. Catheter ready for use. Signed, Dulcy Fanny. Earleen Newport, DO [facility] Radiology Electronically Signed   By: Corrie Mckusick D.O.   On: 08/18/2016 15:53   Ir Thoracentesis Asp Pleural Space W/img Guide  Result Date: 08/14/2016 INDICATION: Shortness of breath. Recurrent right pleural effusion. Request therapeutic thoracentesis. EXAM: ULTRASOUND GUIDED RIGHT THORACENTESIS MEDICATIONS: None. COMPLICATIONS: None immediate. Postprocedural chest x-ray negative for pneumothorax. PROCEDURE: An ultrasound guided thoracentesis was thoroughly discussed with the patient and questions answered. The benefits, risks, alternatives and complications were also discussed. The patient understands and wishes to proceed with the procedure. Written consent was obtained. Ultrasound was performed to localize and mark an adequate pocket of fluid in the right chest. The area was then prepped and draped in the normal sterile fashion. 1% Lidocaine was used for local anesthesia. Under ultrasound guidance a Safe-T-Centesis catheter was introduced. Thoracentesis was performed. The catheter was removed and a dressing applied. FINDINGS: A total of approximately 840 mL of clear, amber colored fluid was removed. IMPRESSION: Successful ultrasound guided right thoracentesis yielding 840 mL of pleural fluid. Read by: Ascencion Dike PA-C Electronically Signed   By: Markus Daft M.D.   On: 08/14/2016 11:30    ASSESSMENT & PLAN:   66 yo with GERD, morbid obesity, likely sleep  apnea, smoker recently quit with  #1 Newly diagnosed atleast stage IV Diffuse large B-cell non-Hodgkin's lymphoma- likely germinal [facility] type diffuse large B cell lymphoma s/p R-CHOP C1 Echo normal Ejection Fraction 60-65% Port-a-cath placed.  #2 Recurrent right pleural effusion likely related to lymphoma possibly from decreased lymphatic drainage. Now controlled/resolved  #3 Back pain with large paraspinal mass in the thoracic region with possible epidural extension. No overt neurological symptoms related to this. Neurosurgery was consulted and recommended no interventions.  Plan Labs stable No prohibitive toxicities from C1 of R-CHOP with neulasta. We discussed the increased risk of CNS recurrence with her lymphoma given paraspinal presentation/stages etc and discussed consideration of CNS prophylaxis -- she wants to think about this. If patient is agreeable will plan to do IT MTX from C3-C6 for 4 doses.  #4 . Patient Active Problem List   Diagnosis Date Noted  . DLBCL (diffuse large B cell lymphoma) ([place]) 08/17/2016  . Acute midline thoracic back pain   .  Lymphadenopathy   . Morbid obesity with BMI of 50.0-59.9, adult ([place])   . NHL (non-Hodgkin's lymphoma) ([place])   . Abdominal pain, RUQ   . Pleural effusion on right 08/07/2016  . Abnormal EKG 08/07/2016  . 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 Anemia 08/07/2016  . GERD (gastroesophageal reflux disease) 08/07/2016  . Shortness of breath 08/07/2016   -continue f/u with PCP on discharge  -would recommend outpatient sleep study to evaluate for sleep apnea.  Please schedule C2, C3 and C4 of R-CHOP with neulasta Labs q3weeks with each chemotherapy RTC with Dr Irene Limbo with C2D1 of chemotherapy with labs   I spent 20 minutes counseling the patient face to face. The total time spent in the appointment was 25 minutes and more than 50% was on counseling and direct patient cares.    Sullivan Lone MD [place] AAHIVMS [facility]  [facility] Hematology/Oncology Physician [facility]  (Office):       [phone] (Work cell):  [phone] (Fax):           [phone]

## 2016-09-13 ENCOUNTER — Other Ambulatory Visit: Payer: Self-pay | Admitting: *Deleted

## 2016-09-13 ENCOUNTER — Ambulatory Visit (HOSPITAL_BASED_OUTPATIENT_CLINIC_OR_DEPARTMENT_OTHER): Payer: Medicare Other | Admitting: Hematology

## 2016-09-13 ENCOUNTER — Telehealth: Payer: Self-pay

## 2016-09-13 ENCOUNTER — Other Ambulatory Visit (HOSPITAL_BASED_OUTPATIENT_CLINIC_OR_DEPARTMENT_OTHER): Payer: Medicare Other

## 2016-09-13 ENCOUNTER — Ambulatory Visit (HOSPITAL_BASED_OUTPATIENT_CLINIC_OR_DEPARTMENT_OTHER): Payer: Medicare Other

## 2016-09-13 ENCOUNTER — Telehealth: Payer: Self-pay | Admitting: Hematology

## 2016-09-13 VITALS — BP 141/95 | HR 72 | Temp 98.4°F | Resp 24 | Ht 61.0 in | Wt 293.0 lb

## 2016-09-13 VITALS — BP 134/89 | HR 64 | Temp 98.2°F | Resp 19

## 2016-09-13 DIAGNOSIS — C8338 Diffuse large B-cell lymphoma, lymph nodes of multiple sites: Secondary | ICD-10-CM

## 2016-09-13 DIAGNOSIS — C8330 Diffuse large B-cell lymphoma, unspecified site: Secondary | ICD-10-CM

## 2016-09-13 DIAGNOSIS — Z5112 Encounter for antineoplastic immunotherapy: Secondary | ICD-10-CM

## 2016-09-13 DIAGNOSIS — Z5111 Encounter for antineoplastic chemotherapy: Secondary | ICD-10-CM | POA: Diagnosis not present

## 2016-09-13 DIAGNOSIS — L659 Nonscarring hair loss, unspecified: Secondary | ICD-10-CM

## 2016-09-13 LAB — CBC & DIFF AND RETIC
BASO%: 0.4 % (ref 0.0–2.0)
Basophils Absolute: 0 10*3/uL (ref 0.0–0.1)
EOS%: 0.5 % (ref 0.0–7.0)
Eosinophils Absolute: 0 10*3/uL (ref 0.0–0.5)
HCT: 37.1 % (ref 34.8–46.6)
HGB: 11.8 g/dL (ref 11.6–15.9)
IMMATURE RETIC FRACT: 8 % (ref 1.60–10.00)
LYMPH#: 0.7 10*3/uL — AB (ref 0.9–3.3)
LYMPH%: 8.6 % — ABNORMAL LOW (ref 14.0–49.7)
MCH: 27.8 pg (ref 25.1–34.0)
MCHC: 31.8 g/dL (ref 31.5–36.0)
MCV: 87.3 fL (ref 79.5–101.0)
MONO#: 0.9 10*3/uL (ref 0.1–0.9)
MONO%: 12.1 % (ref 0.0–14.0)
NEUT%: 78.4 % — ABNORMAL HIGH (ref 38.4–76.8)
NEUTROS ABS: 6 10*3/uL (ref 1.5–6.5)
Platelets: 231 10*3/uL (ref 145–400)
RBC: 4.25 10*6/uL (ref 3.70–5.45)
RDW: 15.7 % — ABNORMAL HIGH (ref 11.2–14.5)
Retic %: 1.55 % (ref 0.70–2.10)
Retic Ct Abs: 65.88 10*3/uL (ref 33.70–90.70)
WBC: 7.7 10*3/uL (ref 3.9–10.3)

## 2016-09-13 LAB — COMPREHENSIVE METABOLIC PANEL
ALT: 22 U/L (ref 0–55)
AST: 20 U/L (ref 5–34)
Albumin: 3.2 g/dL — ABNORMAL LOW (ref 3.5–5.0)
Alkaline Phosphatase: 59 U/L (ref 40–150)
Anion Gap: 7 mEq/L (ref 3–11)
BILIRUBIN TOTAL: 0.37 mg/dL (ref 0.20–1.20)
BUN: 13.4 mg/dL (ref 7.0–26.0)
CHLORIDE: 108 meq/L (ref 98–109)
CO2: 27 meq/L (ref 22–29)
Calcium: 9.4 mg/dL (ref 8.4–10.4)
Creatinine: 0.9 mg/dL (ref 0.6–1.1)
EGFR: 77 mL/min/{1.73_m2} — ABNORMAL LOW (ref >90)
GLUCOSE: 102 mg/dL (ref 70–140)
POTASSIUM: 4 meq/L (ref 3.5–5.1)
SODIUM: 142 meq/L (ref 136–145)
TOTAL PROTEIN: 6.7 g/dL (ref 6.4–8.3)

## 2016-09-13 LAB — LACTATE DEHYDROGENASE: LDH: 270 U/L — ABNORMAL HIGH (ref 125–245)

## 2016-09-13 MED ORDER — ACETAMINOPHEN 325 MG PO TABS
650.0000 mg | ORAL_TABLET | Freq: Once | ORAL | Status: AC
  Administered 2016-09-13: 650 mg via ORAL

## 2016-09-13 MED ORDER — DOXORUBICIN HCL CHEMO IV INJECTION 2 MG/ML
50.0000 mg/m2 | Freq: Once | INTRAVENOUS | Status: AC
  Administered 2016-09-13: 118 mg via INTRAVENOUS
  Filled 2016-09-13: qty 59

## 2016-09-13 MED ORDER — SODIUM CHLORIDE 0.9 % IV SOLN
Freq: Once | INTRAVENOUS | Status: AC
  Administered 2016-09-13: 11:00:00 via INTRAVENOUS

## 2016-09-13 MED ORDER — DEXAMETHASONE SODIUM PHOSPHATE 10 MG/ML IJ SOLN
10.0000 mg | Freq: Once | INTRAMUSCULAR | Status: AC
  Administered 2016-09-13: 10 mg via INTRAVENOUS

## 2016-09-13 MED ORDER — PALONOSETRON HCL INJECTION 0.25 MG/5ML
INTRAVENOUS | Status: AC
Start: 2016-09-13 — End: 2016-09-13
  Filled 2016-09-13: qty 5

## 2016-09-13 MED ORDER — HEPARIN SOD (PORK) LOCK FLUSH 100 UNIT/ML IV SOLN
500.0000 [IU] | Freq: Once | INTRAVENOUS | Status: AC | PRN
  Administered 2016-09-13: 500 [IU]
  Filled 2016-09-13: qty 5

## 2016-09-13 MED ORDER — CYCLOPHOSPHAMIDE CHEMO INJECTION 1 GM
750.0000 mg/m2 | Freq: Once | INTRAMUSCULAR | Status: AC
  Administered 2016-09-13: 1780 mg via INTRAVENOUS
  Filled 2016-09-13: qty 89

## 2016-09-13 MED ORDER — DEXAMETHASONE SODIUM PHOSPHATE 10 MG/ML IJ SOLN
INTRAMUSCULAR | Status: AC
Start: 2016-09-13 — End: 2016-09-13
  Filled 2016-09-13: qty 1

## 2016-09-13 MED ORDER — DIPHENHYDRAMINE HCL 25 MG PO CAPS
ORAL_CAPSULE | ORAL | Status: AC
Start: 2016-09-13 — End: 2016-09-13
  Filled 2016-09-13: qty 2

## 2016-09-13 MED ORDER — SODIUM CHLORIDE 0.9% FLUSH
10.0000 mL | INTRAVENOUS | Status: DC | PRN
  Administered 2016-09-13: 10 mL
  Filled 2016-09-13: qty 10

## 2016-09-13 MED ORDER — VINCRISTINE SULFATE CHEMO INJECTION 1 MG/ML
2.0000 mg | Freq: Once | INTRAVENOUS | Status: AC
  Administered 2016-09-13: 2 mg via INTRAVENOUS
  Filled 2016-09-13: qty 2

## 2016-09-13 MED ORDER — PALONOSETRON HCL INJECTION 0.25 MG/5ML
0.2500 mg | Freq: Once | INTRAVENOUS | Status: AC
  Administered 2016-09-13: 0.25 mg via INTRAVENOUS

## 2016-09-13 MED ORDER — ACETAMINOPHEN 325 MG PO TABS
ORAL_TABLET | ORAL | Status: AC
  Filled 2016-09-13: qty 2

## 2016-09-13 MED ORDER — DIPHENHYDRAMINE HCL 25 MG PO CAPS
50.0000 mg | ORAL_CAPSULE | Freq: Once | ORAL | Status: AC
  Administered 2016-09-13: 50 mg via ORAL

## 2016-09-13 MED ORDER — SODIUM CHLORIDE 0.9 % IV SOLN
375.0000 mg/m2 | Freq: Once | INTRAVENOUS | Status: AC
  Administered 2016-09-13: 900 mg via INTRAVENOUS
  Filled 2016-09-13: qty 50

## 2016-09-13 NOTE — Progress Notes (Addendum)
Jo Collins  HEMATOLOGY ONCOLOGY PROGRESS NOTE  Date of service: .09/13/2016  Patient Care Team: Deland Pretty, MD as PCP - General (Internal Medicine)  CC: post hospitalization f/u for management of newly diagnosed diffuse large B cell lymphoma  Diagnosis: Diffuse large B cell lymphoma  Current Treatment: R-CHOP with G-CSF support  INTERVAL HISTORY:  Patient is here for f/u for DLBCL and prior to her second cycle of R CHOP. She notes no acute new toxicities. Has had significant alopecia and this is somewhat bothersome to her. I gave her a signed form to avail of the hair wig program. Some grade 1 fatigue. 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 With your permission a referral was given for outpatient cancer rehabilitation to help maintain her endurance strength and stamina. I had a follow-up discussion regarding intrathecal methotrexate for CNS prophylaxis form her high risk diffuse large B-cell lymphoma. After having a detailed discussion of the pros and cons potential adverse effects procedure involved she would like to proceed with this. No fevers no chills no night sweats. No chest pain or shortness of breath. Back pain has nearly resolved and she is not needing much pain medications. Labs stable.  REVIEW OF SYSTEMS:    10 Point review of systems of done and is negative except as noted above.  . Past Medical History:  Diagnosis Date  . GERD (gastroesophageal reflux disease)   . NHL (non-Hodgkin's lymphoma) ([place])     . Past Surgical History:  Procedure Laterality Date  . ABDOMINAL HYSTERECTOMY    . IR FLUORO GUIDE PORT INSERTION RIGHT  08/18/2016  . IR THORACENTESIS ASP PLEURAL SPACE W/IMG GUIDE  08/07/2016  . IR THORACENTESIS ASP PLEURAL SPACE W/IMG GUIDE  08/14/2016  . IR US GUIDE VASC ACCESS RIGHT  08/18/2016    . Social History  Substance Use Topics  . Smoking status: Former Smoker    Packs/day: 0.50    Years: 47.00      Types: Cigarettes    Quit date: 07/28/2016  . Smokeless tobacco: Never Used  . Alcohol use No    ALLERGIES:  has No Known Allergies.  MEDICATIONS:  Current Outpatient Prescriptions  Medication Sig Dispense Refill  . allopurinol (ZYLOPRIM) 100 MG tablet Take 1 tablet (100 mg total) by mouth 2 (two) times daily. 30 tablet 0  . dexamethasone (DECADRON) 4 MG tablet Take 2 tablets (8 mg total) by mouth daily. 15 tablet 0  . lidocaine-prilocaine (EMLA) cream Apply to affected area once 30 g 3  . lidocaine-prilocaine (EMLA) cream Apply 1 application topically as needed. Apply to port-a-cath assess site 45-60 mins prior to chemotherapy. 30 g 6  . magnesium oxide (MAG-OX) 400 MG tablet Take 400 mg by mouth daily.    . Multiple Vitamin (MULTIVITAMIN WITH MINERALS) TABS tablet Take 1 tablet by mouth daily.    . ondansetron (ZOFRAN) 8 MG tablet Take 1 tablet (8 mg total) by mouth 2 (two) times daily as needed for refractory nausea / vomiting. Start on day 3 after cyclophosphamide rx 30 tablet 1  . oxyCODONE-acetaminophen (PERCOCET/ROXICET) 5-325 MG tablet Take 2 tablets by mouth every 4 (four) hours as needed for moderate pain. 30 tablet 0  . polyethylene glycol (MIRALAX / GLYCOLAX) packet Take 17 g by mouth daily. (Patient not taking: Reported on 08/22/2016) 14 each 0  . predniSONE (DELTASONE) 20 MG tablet Take 3 tablets (60 mg total) by mouth daily. Take on days 1-5 of  chemotherapy. 20 tablet 5  . prochlorperazine (COMPAZINE) 10 MG tablet Take 1 tablet (10 mg total) by mouth every 6 (six) hours as needed (Nausea or vomiting). 30 tablet 6  . vitamin B-12 500 MCG tablet Take 1 tablet (500 mcg total) by mouth daily. 30 tablet 0   No current facility-administered medications for this visit.     PHYSICAL EXAMINATION: ECOG PERFORMANCE STATUS: 1 - Symptomatic but completely ambulatory  . Vitals:   09/13/16 0922  BP: (!) 141/95  Pulse: 72  Resp: (!) 24  Temp: 98.4 F (36.9 C)  SpO2: 100%     Filed Weights   09/13/16 0922  Weight: 293 lb (132.9 kg)   .Body mass index is 55.36 kg/m.  GENERAL:alert, in no acute distress and comfortable SKIN: no acute rashes, no significant lesions EYES: conjunctiva are pink and non-injected, sclera anicteric OROPHARYNX: MMM, no exudates, no oropharyngeal erythema or ulceration NECK: supple, no JVD LYMPH:  no palpable lymphadenopathy in the cervical, axillary or inguinal regions LUNGS: clear to auscultation b/l with normal respiratory effort HEART: regular rate & rhythm ABDOMEN:  normoactive bowel sounds , non tender, not distended. Extremity: trace pedal edema PSYCH: alert & oriented x 3 with fluent speech NEURO: no focal motor/sensory deficits  LABORATORY DATA:   I have reviewed the data as listed  . CBC Latest Ref Rng & Units 09/13/2016 09/01/2016 08/22/2016  WBC 3.9 - 10.3 10e3/uL 7.7 4.3 11.4(H)  Hemoglobin 11.6 - 15.9 g/dL 11.8 12.0 11.3(L)  Hematocrit 34.8 - 46.6 % 37.1 37.5 35.7  Platelets 145 - 400 10e3/uL 231 147 221    . CMP Latest Ref Rng & Units 09/13/2016 09/01/2016 08/22/2016  Glucose 70 - 140 mg/dl 102 131 101  BUN 7.0 - 26.0 mg/dL 13.4 8.8 19.4  Creatinine 0.6 - 1.1 mg/dL 0.9 0.8 0.9  Sodium 136 - 145 mEq/L 142 137 140  Potassium 3.5 - 5.1 mEq/L 4.0 3.8 4.0  Chloride 101 - 111 mmol/L - - -  CO2 22 - 29 mEq/L 27 24 27   Calcium 8.4 - 10.4 mg/dL 9.4 9.4 8.9  Total Protein 6.4 - 8.3 g/dL 6.7 6.6 6.2(L)  Total Bilirubin 0.20 - 1.20 mg/dL 0.37 0.28 0.30  Alkaline Phos 40 - 150 U/L 59 64 46  AST 5 - 34 U/L 20 35(H) 14  ALT 0 - 55 U/L 22 72(H) 27   . Lab Results  Component Value Date   LDH 270 (H) 09/13/2016     RADIOGRAPHIC STUDIES: I have personally reviewed the radiological images as listed and agreed with the findings in the report. Dg Chest 1 View  Result Date: 08/14/2016 CLINICAL DATA:  Status post right-sided thoracentesis EXAM: CHEST 1 VIEW COMPARISON:  Chest x-ray dated August 13, 2016 FINDINGS:  The left lung is adequately inflated. On the right there is improved aeration at the lung base since thoracentesis. The volume of pleural fluid has decreased somewhat. No postprocedure pneumothorax is observed. The cardiac silhouette remains enlarged. The central pulmonary vascularity is prominent but may be accentuated by patient positioning. IMPRESSION: No postprocedure pneumothorax following right-sided thoracentesis. Some improved aeration of the right lung is seen. Electronically Signed   By: David  Martinique M.D.   On: 08/14/2016 11:20   Nm Pet Image Initial (pi) Skull Base To Thigh  Result Date: 08/19/2016 CLINICAL DATA:  Initial treatment strategy for diffuse large B-cell lymphoma. EXAM: NUCLEAR MEDICINE PET SKULL BASE TO THIGH TECHNIQUE: 12.5 mCi F-18 FDG was injected intravenously. Full-ring PET imaging was  performed from the skull base to thigh after the radiotracer. CT data was obtained and used for attenuation correction and anatomic localization. FASTING BLOOD GLUCOSE:  Value: 86 mg/dl COMPARISON:  CT chest abdomen pelvis 08/08/2016. FINDINGS: NECK: No hypermetabolic lymph nodes in the neck. CT images show no acute findings. CHEST: Hypermetabolic paraspinal mass measures 8.6 x 9.4 cm with an SUV max of 17.7. Pleural nodules in the right hemithorax are hypermetabolic. Index nodule along the posteromedial mid right hemithorax measures 1.3 x 3.0 cm (CT image 62) with an SUV max of 10.2. Juxta diaphragmatic lymph node measures 12 mm with an SUV max of 8.4. No hypermetabolic mediastinal, hilar or axillary lymph nodes. No pericardial effusion. Moderate right pleural effusion with collapse/ consolidation in the right lower lobe, similar to 08/08/2016. ABDOMEN/PELVIS: No abnormal hypermetabolism in the liver, adrenal glands, spleen or pancreas. Gastrohepatic ligament lymph node measures 12 mm with an SUV max of 4.9. Right external iliac lymph nodes measure up to 2.4 cm in short axis with an SUV max of 9.3.  Right inguinal lymph nodes measure up to 1.6 cm with an SUV max of 10.0. No acute findings in the abdomen or pelvis in the interval from 08/08/2016. SKELETON: Mild scattered patchy skeletal uptake. There is a focal area of abnormal hypermetabolism in the proximal left humeral shaft, with an SUV max of 10.8. Abnormal hypermetabolism is seen in the T7 and T8 vertebral bodies without a definitive CT correlate. It is seen at the level of the above-described large hypermetabolic paraspinal soft tissue mass. IMPRESSION: 1. Hypermetabolic intrathoracic paraspinal soft tissue mass with hypermetabolic right pleural nodularity and hypermetabolic lymph nodes in the lower chest, abdomen and pelvis, consistent with the given history of lymphoma. 2. Patchy osseous uptake with focal hypermetabolism in the proximal left humerus as well as T7 and T8 vertebral bodies, worrisome for lymphomatous involvement. 3. Moderate right pleural effusion with collapse/consolidation in the right lower lobe, similar to 08/08/2016. Electronically Signed   By: Lorin Picket M.D.   On: 08/19/2016 11:33   Ir US Guide Vasc Access Right  Result Date: 08/21/2016 INDICATION: 66 year old female with a history of non-Hodgkin's lymphoma EXAM: IMPLANTED PORT A CATH PLACEMENT WITH ULTRASOUND AND FLUOROSCOPIC GUIDANCE MEDICATIONS: 2.0 g Ancef; The antibiotic was administered within an appropriate time interval prior to skin puncture. ANESTHESIA/SEDATION: Moderate (conscious) sedation was employed during this procedure. A total of Versed 2.0 mg and Fentanyl 50 mcg was administered intravenously. Moderate Sedation Time: 27 minutes. The patient's level of consciousness and vital signs were monitored continuously by radiology nursing throughout the procedure under my direct supervision. FLUOROSCOPY TIME:  Zero minutes, 24 seconds (4.2 mGy) COMPLICATIONS: None PROCEDURE: The procedure, risks, benefits, and alternatives were explained to the patient. Questions  regarding the procedure were encouraged and answered. The patient understands and consents to the procedure. Ultrasound survey was performed with images stored and sent to PACs. The right neck and chest was prepped with chlorhexidine, and draped in the usual sterile fashion using maximum barrier technique (cap and mask, sterile gown, sterile gloves, large sterile sheet, hand hygiene and cutaneous antiseptic). Antibiotic prophylaxis was provided with 2.0g Ancef administered IV one hour prior to skin incision. Local anesthesia was attained by infiltration with 1% lidocaine without epinephrine. Ultrasound demonstrated patency of the right internal jugular vein, and this was documented with an image. Under real-time ultrasound guidance, this vein was accessed with a 21 gauge micropuncture needle and image documentation was performed. A small dermatotomy was made at the  access site with an 11 scalpel. A 0.018" wire was advanced into the SVC and used to estimate the length of the internal catheter. The access needle exchanged for a 29F micropuncture vascular sheath. The 0.018" wire was then removed and a 0.035" wire advanced into the IVC. An appropriate location for the subcutaneous reservoir was selected below the clavicle and an incision was made through the skin and underlying soft tissues. The subcutaneous tissues were then dissected using a combination of blunt and sharp surgical technique and a pocket was formed. A single lumen power injectable portacatheter was then tunneled through the subcutaneous tissues from the pocket to the dermatotomy and the port reservoir placed within the subcutaneous pocket. The venous access site was then serially dilated and a peel away vascular sheath placed over the wire. The wire was removed and the port catheter advanced into position under fluoroscopic guidance. The catheter tip is positioned in the cavoatrial junction. This was documented with a spot image. The portacatheter was  then tested and found to flush and aspirate well. The port was flushed with saline followed by 100 units/mL heparinized saline. The pocket was then closed in two layers using first subdermal inverted interrupted absorbable sutures followed by a running subcuticular suture. The epidermis was then sealed with Dermabond. The dermatotomy at the venous access site was also seal with Dermabond. Patient tolerated the procedure well and remained hemodynamically stable throughout. No complications encountered and no significant blood loss encountered IMPRESSION: Status post right IJ port catheter placement. Catheter ready for use. Signed, Dulcy Fanny. Earleen Newport, DO [facility] Radiology Electronically Signed   By: Corrie Mckusick D.O.   On: 08/18/2016 15:53   Ir Fluoro Guide Port Insertion Right  Result Date: 08/18/2016 INDICATION: 66 year old female with a history of non-Hodgkin's lymphoma EXAM: IMPLANTED PORT A CATH PLACEMENT WITH ULTRASOUND AND FLUOROSCOPIC GUIDANCE MEDICATIONS: 2.0 g Ancef; The antibiotic was administered within an appropriate time interval prior to skin puncture. ANESTHESIA/SEDATION: Moderate (conscious) sedation was employed during this procedure. A total of Versed 2.0 mg and Fentanyl 50 mcg was administered intravenously. Moderate Sedation Time: 27 minutes. The patient's level of consciousness and vital signs were monitored continuously by radiology nursing throughout the procedure under my direct supervision. FLUOROSCOPY TIME:  Zero minutes, 24 seconds (4.2 mGy) COMPLICATIONS: None PROCEDURE: The procedure, risks, benefits, and alternatives were explained to the patient. Questions regarding the procedure were encouraged and answered. The patient understands and consents to the procedure. Ultrasound survey was performed with images stored and sent to PACs. The right neck and chest was prepped with chlorhexidine, and draped in the usual sterile fashion using  maximum barrier technique (cap and mask, sterile gown, sterile gloves, large sterile sheet, hand hygiene and cutaneous antiseptic). Antibiotic prophylaxis was provided with 2.0g Ancef administered IV one hour prior to skin incision. Local anesthesia was attained by infiltration with 1% lidocaine without epinephrine. Ultrasound demonstrated patency of the right internal jugular vein, and this was documented with an image. Under real-time ultrasound guidance, this vein was accessed with a 21 gauge micropuncture needle and image documentation was performed. A small dermatotomy was made at the access site with an 11 scalpel. A 0.018" wire was advanced into the SVC and used to estimate the length of the internal catheter. The access needle exchanged for a 29F micropuncture vascular sheath. The 0.018" wire was then removed and a 0.035" wire advanced into the IVC. An appropriate location for the subcutaneous reservoir was selected below the  clavicle and an incision was made through the skin and underlying soft tissues. The subcutaneous tissues were then dissected using a combination of blunt and sharp surgical technique and a pocket was formed. A single lumen power injectable portacatheter was then tunneled through the subcutaneous tissues from the pocket to the dermatotomy and the port reservoir placed within the subcutaneous pocket. The venous access site was then serially dilated and a peel away vascular sheath placed over the wire. The wire was removed and the port catheter advanced into position under fluoroscopic guidance. The catheter tip is positioned in the cavoatrial junction. This was documented with a spot image. The portacatheter was then tested and found to flush and aspirate well. The port was flushed with saline followed by 100 units/mL heparinized saline. The pocket was then closed in two layers using first subdermal inverted interrupted absorbable sutures followed by a running subcuticular suture. The  epidermis was then sealed with Dermabond. The dermatotomy at the venous access site was also seal with Dermabond. Patient tolerated the procedure well and remained hemodynamically stable throughout. No complications encountered and no significant blood loss encountered IMPRESSION: Status post right IJ port catheter placement. Catheter ready for use. Signed, Dulcy Fanny. Earleen Newport, DO [facility] Radiology Electronically Signed   By: Corrie Mckusick D.O.   On: 08/18/2016 15:53   Ir Thoracentesis Asp Pleural Space W/img Guide  Result Date: 08/14/2016 INDICATION: Shortness of breath. Recurrent right pleural effusion. Request therapeutic thoracentesis. EXAM: ULTRASOUND GUIDED RIGHT THORACENTESIS MEDICATIONS: None. COMPLICATIONS: None immediate. Postprocedural chest x-ray negative for pneumothorax. PROCEDURE: An ultrasound guided thoracentesis was thoroughly discussed with the patient and questions answered. The benefits, risks, alternatives and complications were also discussed. The patient understands and wishes to proceed with the procedure. Written consent was obtained. Ultrasound was performed to localize and mark an adequate pocket of fluid in the right chest. The area was then prepped and draped in the normal sterile fashion. 1% Lidocaine was used for local anesthesia. Under ultrasound guidance a Safe-T-Centesis catheter was introduced. Thoracentesis was performed. The catheter was removed and a dressing applied. FINDINGS: A total of approximately 840 mL of clear, amber colored fluid was removed. IMPRESSION: Successful ultrasound guided right thoracentesis yielding 840 mL of pleural fluid. Read by: Ascencion Dike PA-C Electronically Signed   By: Markus Daft M.D.   On: 08/14/2016 11:30    ASSESSMENT & PLAN:   66 yo with GERD, morbid obesity, likely sleep apnea, smoker recently quit with  #1 Newly diagnosed atleast stage IV Diffuse large B-cell non-Hodgkin's  lymphoma- likely germinal center type diffuse large B cell lymphoma s/p R-CHOP C1 Echo normal Ejection Fraction 60-65% Port-a-cath placed. Patient status post cycle 1 CHOP #2 Recurrent right pleural effusion likely related to lymphoma possibly from decreased lymphatic drainage. Now controlled/resolved #3 Back pain with large paraspinal mass in the thoracic region with possible epidural extension. No overt neurological symptoms related to this. Neurosurgery was consulted and recommended no interventions. Plan Labs stable -No prohibitive toxicities from C1 of R-CHOP with neulasta. -Labs today are stable and the patient is appropriate to proceed with her second cycle of R CHOP chemotherapy with Neulasta support. -We discussed that she is at increased risk of CNS recurrence with her lymphoma given paraspinal presentation with post epidural impingement/stage of disease and discussed consideration of CNS prophylaxis on last visit. She had follow-up questions were answered in details and she is agreeable to proceed. -will schedule remaining 4 cycles of R-CHOP with neulasta  Intrathecal Methotrexate with Interventional Radiology on D1 of C3 of R-CHOP - orders placed RTC with Labs with Dr Irene Limbo on C3 D1 of treatment SW consultation for exploring hair wig program (authorization form signed) Cancer rehab referral Will need PET/CT after 3 cycles to re-evaluate treatment response and treatment strategy   #4 . Patient Active Problem List   Diagnosis Date Noted  . DLBCL (diffuse large B cell lymphoma) ([place]) 08/17/2016  . Acute midline thoracic back pain   . Lymphadenopathy   . Morbid obesity with BMI of 50.0-59.9, adult ([place])   . NHL (non-Hodgkin's lymphoma) ([place])   . Abdominal pain, RUQ   . Pleural effusion on right 08/07/2016  . Abnormal EKG 08/07/2016  . 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 Anemia 08/07/2016  . GERD (gastroesophageal reflux disease) 08/07/2016  . Shortness of breath 08/07/2016    -continue f/u with PCP for other medical cares -would recommend sleep study to evaluate for sleep apnea.   I spent 20 minutes counseling the patient face to face. The total time spent in the appointment was 30 minutes and more than 50% was on counseling and direct patient cares.    Sullivan Lone MD [place] AAHIVMS [facility] Hematology/Oncology Physician [facility]  (Office):       [phone] (Work cell):  [phone] (Fax):           [phone]

## 2016-09-13 NOTE — Telephone Encounter (Signed)
Pt to be scheduled for IT methotrexate the day before, of, or after her next RCHOP. Dr. Irene Limbo intends for pt to have IT methotrexate with next 4 RCHOP infusions based on tolerance. Only first IT methotrexate ordered and in que to be scheduled.

## 2016-09-13 NOTE — Patient Instructions (Signed)
Please take 8mg  po dexmaethasone 60 mins prior to Intrathecal chemotherapy

## 2016-09-13 NOTE — Telephone Encounter (Signed)
Scheduled appt per 9/12 los - Gave patient AVS and calender per los.  

## 2016-09-13 NOTE — Patient Instructions (Signed)
[facility] Discharge Instructions for Patients Receiving Chemotherapy  Today you received the following chemotherapy agents Adriamycin, Vincristine, Cytoxan and Rituxan  To help prevent nausea and vomiting after your treatment, we encourage you to take your nausea medication as directed. Do not take zofran for 3 days.  May resume taking zofran on 08/24/16. If you develop nausea and vomiting that is not controlled by your nausea medication, call the clinic.   BELOW ARE SYMPTOMS THAT SHOULD BE REPORTED IMMEDIATELY:  *FEVER GREATER THAN 100.5 F  *CHILLS WITH OR WITHOUT FEVER  NAUSEA AND VOMITING THAT IS NOT CONTROLLED WITH YOUR NAUSEA MEDICATION  *UNUSUAL SHORTNESS OF BREATH  *UNUSUAL BRUISING OR BLEEDING  TENDERNESS IN MOUTH AND THROAT WITH OR WITHOUT PRESENCE OF ULCERS  *URINARY PROBLEMS  *BOWEL PROBLEMS  UNUSUAL RASH Items with * indicate a potential emergency and should be followed up as soon as possible.  Feel free to call the clinic you have any questions or concerns. The clinic phone number is [phone].  Please show the [facility] at check-in to the Emergency Department and triage nurse.  Doxorubicin injection What is this medicine? DOXORUBICIN (dox oh ROO bi sin) is a chemotherapy drug. It is used to treat many kinds of cancer like leukemia, lymphoma, neuroblastoma, sarcoma, and Wilms' tumor. It is also used to treat bladder cancer, breast cancer, lung cancer, ovarian cancer, stomach cancer, and thyroid cancer. This medicine may be used for other purposes; ask your health care provider or pharmacist if you have questions. COMMON BRAND NAME(S): Adriamycin, Adriamycin PFS, Adriamycin RDF, Rubex What should I tell my health care provider before I take this medicine? They need to know if you have any of these conditions: -heart disease -history of low blood counts caused by a medicine -liver disease -recent or ongoing radiation therapy -an  unusual or allergic reaction to doxorubicin, other chemotherapy agents, other medicines, foods, dyes, or preservatives -pregnant or trying to get pregnant -breast-feeding How should I use this medicine? This drug is given as an infusion into a vein. It is administered in a hospital or clinic by a specially trained health care professional. If you have pain, swelling, burning or any unusual feeling around the site of your injection, tell your health care professional right away. Talk to your pediatrician regarding the use of this medicine in children. Special care may be needed. Overdosage: If you think you have taken too much of this medicine contact a poison control center or emergency room at once. NOTE: This medicine is only for you. Do not share this medicine with others. What if I miss a dose? It is important not to miss your dose. Call your doctor or health care professional if you are unable to keep an appointment. What may interact with this medicine? This medicine may interact with the following medications: -6-mercaptopurine -paclitaxel -phenytoin -St. John's Wort -trastuzumab -verapamil This list may not describe all possible interactions. Give your health care provider a list of all the medicines, herbs, non-prescription drugs, or dietary supplements you use. Also tell them if you smoke, drink alcohol, or use illegal drugs. Some items may interact with your medicine. What should I watch for while using this medicine? This drug may make you feel generally unwell. This is not uncommon, as chemotherapy can affect healthy cells as well as cancer cells. Report any side effects. Continue your course of treatment even though you feel ill unless your doctor tells you to stop. There is a maximum amount  of this medicine you should receive throughout your life. The amount depends on the medical condition being treated and your overall health. Your doctor will watch how much of this medicine you  receive in your lifetime. Tell your doctor if you have taken this medicine before. You may need blood work done while you are taking this medicine. Your urine may turn red for a few days after your dose. This is not blood. If your urine is dark or brown, call your doctor. In some cases, you may be given additional medicines to help with side effects. Follow all directions for their use. Call your doctor or health care professional for advice if you get a fever, chills or sore throat, or other symptoms of a cold or flu. Do not treat yourself. This drug decreases your body's ability to fight infections. Try to avoid being around people who are sick. This medicine may increase your risk to bruise or bleed. Call your doctor or health care professional if you notice any unusual bleeding. Talk to your doctor about your risk of cancer. You may be more at risk for certain types of cancers if you take this medicine. Do not become pregnant while taking this medicine or for 6 months after stopping it. Women should inform their doctor if they wish to become pregnant or think they might be pregnant. Men should not father a child while taking this medicine and for 6 months after stopping it. There is a potential for serious side effects to an unborn child. Talk to your health care professional or pharmacist for more information. Do not breast-feed an infant while taking this medicine. This medicine has caused ovarian failure in some women and reduced sperm counts in some men This medicine may interfere with the ability to have a child. Talk with your doctor or health care professional if you are concerned about your fertility. What side effects may I notice from receiving this medicine? Side effects that you should report to your doctor or health care professional as soon as possible: -allergic reactions like skin rash, itching or hives, swelling of the face, lips, or tongue -breathing problems -chest pain -fast or  irregular heartbeat -low blood counts - this medicine may decrease the number of white blood cells, red blood cells and platelets. You may be at increased risk for infections and bleeding. -pain, redness, or irritation at site where injected -signs of infection - fever or chills, cough, sore throat, pain or difficulty passing urine -signs of decreased platelets or bleeding - bruising, pinpoint red spots on the skin, black, tarry stools, blood in the urine -swelling of the ankles, feet, hands -tiredness -weakness Side effects that usually do not require medical attention (report to your doctor or health care professional if they continue or are bothersome): -diarrhea -hair loss -mouth sores -nail discoloration or damage -nausea -red colored urine -vomiting This list may not describe all possible side effects. Call your doctor for medical advice about side effects. You may report side effects to FDA at 1-800-FDA-1088. Where should I keep my medicine? This drug is given in a hospital or clinic and will not be stored at home. NOTE: This sheet is a summary. It may not cover all possible information. If you have questions about this medicine, talk to your doctor, pharmacist, or health care provider.  2018 Elsevier/Gold Standard (2015-02-15 11:28:51)   Vincristine injection What is this medicine? VINCRISTINE (vin KRIS teen) is a chemotherapy drug. It slows the growth of cancer cells.  This medicine is used to treat many types of cancer like Hodgkin's disease, leukemia, non-Hodgkin's lymphoma, neuroblastoma (brain cancer), rhabdomyosarcoma, and Wilms' tumor. This medicine may be used for other purposes; ask your health care provider or pharmacist if you have questions. COMMON BRAND NAME(S): Oncovin, Vincasar PFS What should I tell my health care provider before I take this medicine? They need to know if you have any of these conditions: -blood disorders -gout -infection (especially  chickenpox, cold sores, or herpes) -kidney disease -liver disease -lung disease -nervous system disease like Charcot-Marie-Tooth (CMT) -recent or ongoing radiation therapy -an unusual or allergic reaction to vincristine, other chemotherapy agents, other medicines, foods, dyes, or preservatives -pregnant or trying to get pregnant -breast-feeding How should I use this medicine? This drug is given as an infusion into a vein. It is administered in a hospital or clinic by a specially trained health care professional. If you have pain, swelling, burning, or any unusual feeling around the site of your injection, tell your health care professional right away. Talk to your pediatrician regarding the use of this medicine in children. While this drug may be prescribed for selected conditions, precautions do apply. Overdosage: If you think you have taken too much of this medicine contact a poison control center or emergency room at once. NOTE: This medicine is only for you. Do not share this medicine with others. What if I miss a dose? It is important not to miss your dose. Call your doctor or health care professional if you are unable to keep an appointment. What may interact with this medicine? Do not take this medicine with any of the following medications: -itraconazole -mibefradil -voriconazole This medicine may also interact with the following medications: -cyclosporine -erythromycin -fluconazole -ketoconazole -medicines for HIV like delavirdine, efavirenz, nevirapine -medicines for seizures like ethotoin, fosphenotoin, phenytoin -medicines to increase blood counts like filgrastim, pegfilgrastim, sargramostim -other chemotherapy drugs like cisplatin, L-asparaginase, methotrexate, mitomycin, paclitaxel -pegaspargase -vaccines -zalcitabine, ddC Talk to your doctor or health care professional before taking any of these  medicines: -acetaminophen -aspirin -ibuprofen -ketoprofen -naproxen This list may not describe all possible interactions. Give your health care provider a list of all the medicines, herbs, non-prescription drugs, or dietary supplements you use. Also tell them if you smoke, drink alcohol, or use illegal drugs. Some items may interact with your medicine. What should I watch for while using this medicine? Your condition will be monitored carefully while you are receiving this medicine. You will need important blood work done while you are taking this medicine. This drug may make you feel generally unwell. This is not uncommon, as chemotherapy can affect healthy cells as well as cancer cells. Report any side effects. Continue your course of treatment even though you feel ill unless your doctor tells you to stop. In some cases, you may be given additional medicines to help with side effects. Follow all directions for their use. Call your doctor or health care professional for advice if you get a fever, chills or sore throat, or other symptoms of a cold or flu. Do not treat yourself. Avoid taking products that contain aspirin, acetaminophen, ibuprofen, naproxen, or ketoprofen unless instructed by your doctor. These medicines may hide a fever. Do not become pregnant while taking this medicine. Women should inform their doctor if they wish to become pregnant or think they might be pregnant. There is a potential for serious side effects to an unborn child. Talk to your health care professional or pharmacist for more information. Do  not breast-feed an infant while taking this medicine. Men may have a lower sperm count while taking this medicine. Talk to your doctor if you plan to father a child. What side effects may I notice from receiving this medicine? Side effects that you should report to your doctor or health care professional as soon as possible: -allergic reactions like skin rash, itching or hives,  swelling of the face, lips, or tongue -breathing problems -confusion or changes in emotions or moods -constipation -cough -mouth sores -muscle weakness -nausea and vomiting -pain, swelling, redness or irritation at the injection site -pain, tingling, numbness in the hands or feet -problems with balance, talking, walking -seizures -stomach pain -trouble passing urine or change in the amount of urine Side effects that usually do not require medical attention (report to your doctor or health care professional if they continue or are bothersome): -diarrhea -hair loss -jaw pain -loss of appetite This list may not describe all possible side effects. Call your doctor for medical advice about side effects. You may report side effects to FDA at 1-800-FDA-1088. Where should I keep my medicine? This drug is given in a hospital or clinic and will not be stored at home. NOTE: This sheet is a summary. It may not cover all possible information. If you have questions about this medicine, talk to your doctor, pharmacist, or health care provider.  2018 Elsevier/Gold Standard (2007-09-16 17:17:13)   Cyclophosphamide injection What is this medicine? CYCLOPHOSPHAMIDE (sye kloe FOSS fa mide) is a chemotherapy drug. It slows the growth of cancer cells. This medicine is used to treat many types of cancer like lymphoma, myeloma, leukemia, breast cancer, and ovarian cancer, to name a few. This medicine may be used for other purposes; ask your health care provider or pharmacist if you have questions. COMMON BRAND NAME(S): Cytoxan, Neosar What should I tell my health care provider before I take this medicine? They need to know if you have any of these conditions: -blood disorders -history of other chemotherapy -infection -kidney disease -liver disease -recent or ongoing radiation therapy -tumors in the bone marrow -an unusual or allergic reaction to cyclophosphamide, other chemotherapy, other medicines,  foods, dyes, or preservatives -pregnant or trying to get pregnant -breast-feeding How should I use this medicine? This drug is usually given as an injection into a vein or muscle or by infusion into a vein. It is administered in a hospital or clinic by a specially trained health care professional. Talk to your pediatrician regarding the use of this medicine in children. Special care may be needed. Overdosage: If you think you have taken too much of this medicine contact a poison control center or emergency room at once. NOTE: This medicine is only for you. Do not share this medicine with others. What if I miss a dose? It is important not to miss your dose. Call your doctor or health care professional if you are unable to keep an appointment. What may interact with this medicine? This medicine may interact with the following medications: -amiodarone -amphotericin B -azathioprine -certain antiviral medicines for HIV or AIDS such as protease inhibitors (e.g., indinavir, ritonavir) and zidovudine -certain blood pressure medications such as benazepril, captopril, enalapril, fosinopril, lisinopril, moexipril, monopril, perindopril, quinapril, ramipril, trandolapril -certain cancer medications such as anthracyclines (e.g., daunorubicin, doxorubicin), busulfan, cytarabine, paclitaxel, pentostatin, tamoxifen, trastuzumab -certain diuretics such as chlorothiazide, chlorthalidone, hydrochlorothiazide, indapamide, metolazone -certain medicines that treat or prevent blood clots like warfarin -certain muscle relaxants such as succinylcholine -cyclosporine -etanercept -indomethacin -medicines to increase  blood counts like filgrastim, pegfilgrastim, sargramostim -medicines used as general anesthesia -metronidazole -natalizumab This list may not describe all possible interactions. Give your health care provider a list of all the medicines, herbs, non-prescription drugs, or dietary supplements you use.  Also tell them if you smoke, drink alcohol, or use illegal drugs. Some items may interact with your medicine. What should I watch for while using this medicine? Visit your doctor for checks on your progress. This drug may make you feel generally unwell. This is not uncommon, as chemotherapy can affect healthy cells as well as cancer cells. Report any side effects. Continue your course of treatment even though you feel ill unless your doctor tells you to stop. Drink water or other fluids as directed. Urinate often, even at night. In some cases, you may be given additional medicines to help with side effects. Follow all directions for their use. Call your doctor or health care professional for advice if you get a fever, chills or sore throat, or other symptoms of a cold or flu. Do not treat yourself. This drug decreases your body's ability to fight infections. Try to avoid being around people who are sick. This medicine may increase your risk to bruise or bleed. Call your doctor or health care professional if you notice any unusual bleeding. Be careful brushing and flossing your teeth or using a toothpick because you may get an infection or bleed more easily. If you have any dental work done, tell your dentist you are receiving this medicine. You may get drowsy or dizzy. Do not drive, use machinery, or do anything that needs mental alertness until you know how this medicine affects you. Do not become pregnant while taking this medicine or for 1 year after stopping it. Women should inform their doctor if they wish to become pregnant or think they might be pregnant. Men should not father a child while taking this medicine and for 4 months after stopping it. There is a potential for serious side effects to an unborn child. Talk to your health care professional or pharmacist for more information. Do not breast-feed an infant while taking this medicine. This medicine may interfere with the ability to have a  child. This medicine has caused ovarian failure in some women. This medicine has caused reduced sperm counts in some men. You should talk with your doctor or health care professional if you are concerned about your fertility. If you are going to have surgery, tell your doctor or health care professional that you have taken this medicine. What side effects may I notice from receiving this medicine? Side effects that you should report to your doctor or health care professional as soon as possible: -allergic reactions like skin rash, itching or hives, swelling of the face, lips, or tongue -low blood counts - this medicine may decrease the number of white blood cells, red blood cells and platelets. You may be at increased risk for infections and bleeding. -signs of infection - fever or chills, cough, sore throat, pain or difficulty passing urine -signs of decreased platelets or bleeding - bruising, pinpoint red spots on the skin, black, tarry stools, blood in the urine -signs of decreased red blood cells - unusually weak or tired, fainting spells, lightheadedness -breathing problems -dark urine -dizziness -palpitations -swelling of the ankles, feet, hands -trouble passing urine or change in the amount of urine -weight gain -yellowing of the eyes or skin Side effects that usually do not require medical attention (report to your doctor or  health care professional if they continue or are bothersome): -changes in nail or skin color -hair loss -missed menstrual periods -mouth sores -nausea, vomiting This list may not describe all possible side effects. Call your doctor for medical advice about side effects. You may report side effects to FDA at 1-800-FDA-1088. Where should I keep my medicine? This drug is given in a hospital or clinic and will not be stored at home. NOTE: This sheet is a summary. It may not cover all possible information. If you have questions about this medicine, talk to your  doctor, pharmacist, or health care provider.  2018 Elsevier/Gold Standard (2011-11-03 16:22:58)   Rituximab injection What is this medicine? RITUXIMAB (ri TUX i mab) is a monoclonal antibody. It is used to treat certain types of cancer like non-Hodgkin lymphoma and chronic lymphocytic leukemia. It is also used to treat rheumatoid arthritis, granulomatosis with polyangiitis (or Wegener's granulomatosis), and microscopic polyangiitis. This medicine may be used for other purposes; ask your health care provider or pharmacist if you have questions. COMMON BRAND NAME(S): Rituxan What should I tell my health care provider before I take this medicine? They need to know if you have any of these conditions: -heart disease -infection (especially a virus infection such as hepatitis B, chickenpox, cold sores, or herpes) -immune system problems -irregular heartbeat -kidney disease -lung or breathing disease, like asthma -recently received or scheduled to receive a vaccine -an unusual or allergic reaction to rituximab, mouse proteins, other medicines, foods, dyes, or preservatives -pregnant or trying to get pregnant -breast-feeding How should I use this medicine? This medicine is for infusion into a vein. It is administered in a hospital or clinic by a specially trained health care professional. A special MedGuide will be given to you by the pharmacist with each prescription and refill. Be sure to read this information carefully each time. Talk to your pediatrician regarding the use of this medicine in children. This medicine is not approved for use in children. Overdosage: If you think you have taken too much of this medicine contact a poison control center or emergency room at once. NOTE: This medicine is only for you. Do not share this medicine with others. What if I miss a dose? It is important not to miss a dose. Call your doctor or health care professional if you are unable to keep an  appointment. What may interact with this medicine? -cisplatin -other medicines for arthritis like disease modifying antirheumatic drugs or tumor necrosis factor inhibitors -live virus vaccines This list may not describe all possible interactions. Give your health care provider a list of all the medicines, herbs, non-prescription drugs, or dietary supplements you use. Also tell them if you smoke, drink alcohol, or use illegal drugs. Some items may interact with your medicine. What should I watch for while using this medicine? Your condition will be monitored carefully while you are receiving this medicine. You may need blood work done while you are taking this medicine. This medicine can cause serious allergic reactions. To reduce your risk you may need to take medicine before treatment with this medicine. Take your medicine as directed. In some patients, this medicine may cause a serious brain infection that may cause death. If you have any problems seeing, thinking, speaking, walking, or standing, tell your doctor right away. If you cannot reach your doctor, urgently seek other source of medical care. Call your doctor or health care professional for advice if you get a fever, chills or sore throat, or other  symptoms of a cold or flu. Do not treat yourself. This drug decreases your body's ability to fight infections. Try to avoid being around people who are sick. Do not become pregnant while taking this medicine or for 12 months after stopping it. Women should inform their doctor if they wish to become pregnant or think they might be pregnant. There is a potential for serious side effects to an unborn child. Talk to your health care professional or pharmacist for more information. What side effects may I notice from receiving this medicine? Side effects that you should report to your doctor or health care professional as soon as possible: -breathing problems -chest pain -dizziness or feeling  faint -fast, irregular heartbeat -low blood counts - this medicine may decrease the number of white blood cells, red blood cells and platelets. You may be at increased risk for infections and bleeding. -mouth sores -redness, blistering, peeling or loosening of the skin, including inside the mouth (this can be added for any serious or exfoliative rash that could lead to hospitalization) -signs of infection - fever or chills, cough, sore throat, pain or difficulty passing urine -signs and symptoms of kidney injury like trouble passing urine or change in the amount of urine -signs and symptoms of liver injury like dark yellow or brown urine; general ill feeling or flu-like symptoms; light-colored stools; loss of appetite; nausea; right upper belly pain; unusually weak or tired; yellowing of the eyes or skin -stomach pain -vomiting Side effects that usually do not require medical attention (report to your doctor or health care professional if they continue or are bothersome): -headache -joint pain -muscle cramps or muscle pain This list may not describe all possible side effects. Call your doctor for medical advice about side effects. You may report side effects to FDA at 1-800-FDA-1088. Where should I keep my medicine? This drug is given in a hospital or clinic and will not be stored at home. NOTE: This sheet is a summary. It may not cover all possible information. If you have questions about this medicine, talk to your doctor, pharmacist, or health care provider.  2018 Elsevier/Gold Standard (2015-07-28 15:28:09)

## 2016-09-15 ENCOUNTER — Ambulatory Visit (HOSPITAL_BASED_OUTPATIENT_CLINIC_OR_DEPARTMENT_OTHER): Payer: Medicare Other

## 2016-09-15 VITALS — BP 150/88 | HR 58 | Temp 97.8°F

## 2016-09-15 DIAGNOSIS — C8330 Diffuse large B-cell lymphoma, unspecified site: Secondary | ICD-10-CM | POA: Diagnosis not present

## 2016-09-15 DIAGNOSIS — Z5189 Encounter for other specified aftercare: Secondary | ICD-10-CM | POA: Diagnosis not present

## 2016-09-15 DIAGNOSIS — C8338 Diffuse large B-cell lymphoma, lymph nodes of multiple sites: Secondary | ICD-10-CM

## 2016-09-15 MED ORDER — PEGFILGRASTIM INJECTION 6 MG/0.6ML ~~LOC~~
6.0000 mg | PREFILLED_SYRINGE | Freq: Once | SUBCUTANEOUS | Status: AC
  Administered 2016-09-15: 6 mg via SUBCUTANEOUS
  Filled 2016-09-15: qty 0.6

## 2016-09-15 NOTE — Patient Instructions (Signed)
Pegfilgrastim injection What is this medicine? PEGFILGRASTIM (PEG fil gra stim) is a long-acting granulocyte colony-stimulating factor that stimulates the growth of neutrophils, a type of white blood cell important in the body's fight against infection. It is used to reduce the incidence of fever and infection in patients with certain types of cancer who are receiving chemotherapy that affects the bone marrow, and to increase survival after being exposed to high doses of radiation. This medicine may be used for other purposes; ask your health care provider or pharmacist if you have questions. COMMON BRAND NAME(S): Neulasta What should I tell my health care provider before I take this medicine? They need to know if you have any of these conditions: -kidney disease -latex allergy -ongoing radiation therapy -sickle cell disease -skin reactions to acrylic adhesives (On-Body Injector only) -an unusual or allergic reaction to pegfilgrastim, filgrastim, other medicines, foods, dyes, or preservatives -pregnant or trying to get pregnant -breast-feeding How should I use this medicine? This medicine is for injection under the skin. If you get this medicine at home, you will be taught how to prepare and give the pre-filled syringe or how to use the On-body Injector. Refer to the patient Instructions for Use for detailed instructions. Use exactly as directed. Tell your healthcare provider immediately if you suspect that the On-body Injector may not have performed as intended or if you suspect the use of the On-body Injector resulted in a missed or partial dose. It is important that you put your used needles and syringes in a special sharps container. Do not put them in a trash can. If you do not have a sharps container, call your pharmacist or healthcare provider to get one. Talk to your pediatrician regarding the use of this medicine in children. While this drug may be prescribed for selected conditions,  precautions do apply. Overdosage: If you think you have taken too much of this medicine contact a poison control center or emergency room at once. NOTE: This medicine is only for you. Do not share this medicine with others. What if I miss a dose? It is important not to miss your dose. Call your doctor or health care professional if you miss your dose. If you miss a dose due to an On-body Injector failure or leakage, a new dose should be administered as soon as possible using a single prefilled syringe for manual use. What may interact with this medicine? Interactions have not been studied. Give your health care provider a list of all the medicines, herbs, non-prescription drugs, or dietary supplements you use. Also tell them if you smoke, drink alcohol, or use illegal drugs. Some items may interact with your medicine. This list may not describe all possible interactions. Give your health care provider a list of all the medicines, herbs, non-prescription drugs, or dietary supplements you use. Also tell them if you smoke, drink alcohol, or use illegal drugs. Some items may interact with your medicine. What should I watch for while using this medicine? You may need blood work done while you are taking this medicine. If you are going to need a MRI, CT scan, or other procedure, tell your doctor that you are using this medicine (On-Body Injector only). What side effects may I notice from receiving this medicine? Side effects that you should report to your doctor or health care professional as soon as possible: -allergic reactions like skin rash, itching or hives, swelling of the face, lips, or tongue -dizziness -fever -pain, redness, or irritation at site   where injected -pinpoint red spots on the skin -red or dark-brown urine -shortness of breath or breathing problems -stomach or side pain, or pain at the shoulder -swelling -tiredness -trouble passing urine or change in the amount of urine Side  effects that usually do not require medical attention (report to your doctor or health care professional if they continue or are bothersome): -bone pain -muscle pain This list may not describe all possible side effects. Call your doctor for medical advice about side effects. You may report side effects to FDA at 1-800-FDA-1088. Where should I keep my medicine? Keep out of the reach of children. Store pre-filled syringes in a refrigerator between 2 and 8 degrees C (36 and 46 degrees F). Do not freeze. Keep in carton to protect from light. Throw away this medicine if it is left out of the refrigerator for more than 48 hours. Throw away any unused medicine after the expiration date. NOTE: This sheet is a summary. It may not cover all possible information. If you have questions about this medicine, talk to your doctor, pharmacist, or health care provider.  2018 Elsevier/Gold Standard (2015-12-16 12:58:03)  

## 2016-09-18 ENCOUNTER — Ambulatory Visit: Payer: Medicare Other | Attending: Hematology | Admitting: Physical Therapy

## 2016-09-18 VITALS — HR 92

## 2016-09-18 DIAGNOSIS — R2689 Other abnormalities of gait and mobility: Secondary | ICD-10-CM | POA: Diagnosis present

## 2016-09-18 DIAGNOSIS — C8590 Non-Hodgkin lymphoma, unspecified, unspecified site: Secondary | ICD-10-CM | POA: Diagnosis present

## 2016-09-18 NOTE — Therapy (Signed)
[place], [place], [address] Phone: [phone]   Fax:  [phone]  Physical Therapy Evaluation  Patient Details  Name: Jo Collins MRN: [id number] Date of Birth: 11/03/1950 Referring Provider: Dr. Sullivan Lone  Encounter Date: 09/18/2016      PT End of Session - 09/18/16 1201    Visit Number 1   Number of Visits 17   Date for PT Re-Evaluation 12/01/16   PT Start Time 1104   PT Stop Time 1147   PT Time Calculation (min) 43 min   Activity Tolerance Other (comment)  limited by shortness of breath   Behavior During Therapy [facility] for tasks assessed/performed      Past Medical History:  Diagnosis Date  . GERD (gastroesophageal reflux disease)   . NHL (non-Hodgkin's lymphoma) [facility])     Past Surgical History:  Procedure Laterality Date  . ABDOMINAL HYSTERECTOMY    . IR FLUORO GUIDE PORT INSERTION RIGHT  08/18/2016  . IR THORACENTESIS ASP PLEURAL SPACE W/IMG GUIDE  08/07/2016  . IR THORACENTESIS ASP PLEURAL SPACE W/IMG GUIDE  08/14/2016  . IR US GUIDE VASC ACCESS RIGHT  08/18/2016    Vitals:   09/18/16 1124 09/18/16 1128  Pulse: 82 92  SpO2: 91% 95%         Subjective Assessment - 09/18/16 1106    Subjective I'm here today because I have cancer and my doctor wants me to do three things:  eat, exercise, and drink. It's been diagnosed as lymphoma, non-Hodgkin's lymphoma, and he recommended that I come.   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 Recurrent right pleural effusion likely related to lymphoma possibly from decreased lymphatic drainage. Now controlled/resolved. Back pain with large paraspinal mass in the thoracic region with possible epidural extension. No overt neurological symptoms related to this. Neurosurgery  was consulted and recommended no interventions.  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 With your permission a referral was given for outpatient cancer rehabilitation to help maintain her endurance strength and stamina. (Preceding information from Dr. Grier Mitts note.)  No other health issues.   Patient Stated Goals Seeing why she gets shortness of breath.   Currently in Pain? No/denies  but get short of breath with moving around rapidly            [facility] PT Assessment - 09/18/16 0001      Assessment   Medical Diagnosis large B cell non-Hodgkin's lymphoma   Referring Provider Dr. Sullivan Lone   Onset Date/Surgical Date 08/01/16  approx.   Hand Dominance --  ambidextrous   Prior Therapy none     Precautions   Precautions Other (comment)   Precaution Comments Pt. with moderate +/- SOB; O2 level should be monitored with activity; cancer precautions     Restrictions   Weight Bearing Restrictions No     Balance Screen   Has the patient fallen in the past 6 months No   Has the patient had a decrease in activity level because of a fear of falling?  No   Is the patient reluctant to leave their home because of a fear of falling?  No     Home Ecologist residence   Living Arrangements Spouse/significant other  Type of Home House   Home Access Stairs to enter   Entrance Stairs-Number of Steps 2   Entrance Stairs-Rails None   Home Layout One level     Prior Function   Level of Independence Independent   Vocation Full time employment;Self employed   Dietitian; deskwork, no heavy lifting   Leisure tried to exercise, got short of breath so went to urgent care, and that led to her diagnosis     Cognition   Overall Cognitive Status Within Functional Limits for tasks assessed     Observation/Other Assessments   Observations obese adult woman who sounds short of breath in sitting  in chair     Functional Tests   Functional tests Sit to Stand     Sit to Stand   Comments 10 times in 30 seconds  SpO2 = 96, HR = 92 bpm following this     Posture/Postural Control   Posture/Postural Control No significant limitations  good posture     ROM / Strength   AROM / PROM / Strength AROM     AROM   Overall AROM Comments UEs, LEs, and trunk A/ROM all WFL  moderate SOB following this     6 Minute Walk- Baseline   6 Minute Walk- Baseline yes   HR (bpm) 80   02 Sat (%RA) 96 %   Modified Borg Scale for Dyspnea 0.5- Very, very slight shortness of breath     6 Minute walk- Post Test   6 Minute Walk Post Test yes   HR (bpm) 102   02 Sat (%RA) 81 %  quickly up to 94 (after sitting just 1-2 minutes)   Modified Borg Scale for Dyspnea 5- Strong or hard breathing  moderate after just 1-2 minutes     6 minute walk test results    Aerobic Endurance Distance Walked 410   Endurance additional comments **THIS IN 3 MINS. 10 SECONDS; she wanted to stop then**            Objective measurements completed on examination: See above findings.                     Short Term Clinic Goals - 09/18/16 1220      CC Short Term Goal  #1   Title Patient will be able to walk 3 minutes with shortness of breath rated at moderate or below on dyspnea scale.   Baseline On dyspnea scale, rated hard at eval.   Time 4   Period Weeks   Status New   Target Date 11/01/16     CC Short Term Goal  #2   Title Pt. will be independent with initial HEP for endurance.   Time 4   Period Weeks   Status New   Target Date 11/01/16             Long Term Clinic Goals - 09/18/16 1221      CC Long Term Goal  #1   Title Pt. will be able to walk the full six minutes of the 6 minute walk test with SOB at moderate or below on dyspnea scale.   Baseline walked 3 minutes with breathing hard on dyspnea scale   Time 8   Period Weeks   Status New   Target Date 12/01/16     CC Long  Term Goal  #2   Title Pt. will be independent in HEP for endurance exercise.   Time 8  Period Weeks   Status New   Target Date 12/01/16     CC Long Term Goal  #3   Title Pt. will be knowledgeable about options for community exercise programs.   Time 8   Period Weeks   Status New   Target Date 12/01/16             Plan - 09/18/16 1202    Clinical Impression Statement This is a very pleasant woman with diagnosis of non-Hodgkin's lymphoma.  Her diagnosis arose out of her trying to exercise and becoming short of breath, which led to an urgent care visit and subsequent testing.  She is mildly short of breath today at rest, and her rating of shortness of breath increased to strong or hard breathing after walking 410 feet in 3 minutes and 10 seconds (during trial of 6 minute walk test). Her flexibility is good, posture is good, and strength was not formally tested, but she is very functional and performed 10 repetitions of sit to stand in 30 seconds. She has started chemotherapy.   History and Personal Factors relevant to plan of care: none   Clinical Presentation Evolving   Clinical Presentation due to: doing chemotherapy currently   Clinical Decision Making Low   Rehab Potential Good   PT Frequency 2x / week   PT Duration 8 weeks   PT Treatment/Interventions ADLs/Self Care Home Management;Therapeutic exercise;Patient/family education   PT Next Visit Plan Begin endurance training with equipment available here, monitoring O2 sats and heart rate.  Do initial HEP instruction. Add strengthening as time allows.   Consulted and Agree with Plan of Care Patient      Patient will benefit from skilled therapeutic intervention in order to improve the following deficits and impairments:  Cardiopulmonary status limiting activity, Decreased activity tolerance, Decreased endurance  Visit Diagnosis: Other abnormalities of gait and mobility - Plan: PT plan of care cert/re-cert  Non-Hodgkin's  lymphoma in adult [facility]) - Plan: PT plan of care cert/re-cert      G-Codes - 16/10/96 1224    Functional Assessment Tool Used (Outpatient Only) clinical judgement based on 3 minute walk test (unable to do 6 minute test)   Functional Limitation Mobility: Walking and moving around   Mobility: Walking and Moving Around Current Status ([id number]) At least 60 percent but less than 80 percent impaired, limited or restricted   Mobility: Walking and Moving Around Goal Status [id number]) At least 20 percent but less than 40 percent impaired, limited or restricted       Problem List Patient Active Problem List   Diagnosis Date Noted  . DLBCL (diffuse large B cell lymphoma) ([place]) 08/17/2016  . Acute midline thoracic back pain   . Lymphadenopathy   . Morbid obesity with BMI of 50.0-59.9, adult ([place])   . NHL (non-Hodgkin's lymphoma) ([place])   . Abdominal pain, RUQ   . Pleural effusion on right 08/07/2016  . Abnormal EKG 08/07/2016  . 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 Anemia 08/07/2016  . GERD (gastroesophageal reflux disease) 08/07/2016  . Shortness of breath 08/07/2016    SALISBURY,DONNA 09/18/2016, 12:27 PM  [place], [place], [address] Phone: [phone]   Fax:  [phone]  Name: Keani Gotcher MRN: [id number] Date of Birth: July 07, 1950  Serafina Royals, PT 09/18/16 12:27 PM

## 2016-09-19 ENCOUNTER — Encounter: Payer: Self-pay | Admitting: *Deleted

## 2016-09-20 ENCOUNTER — Ambulatory Visit: Payer: Medicare Other | Admitting: Physical Therapy

## 2016-09-20 VITALS — HR 105

## 2016-09-20 DIAGNOSIS — R2689 Other abnormalities of gait and mobility: Secondary | ICD-10-CM | POA: Diagnosis not present

## 2016-09-20 DIAGNOSIS — C8590 Non-Hodgkin lymphoma, unspecified, unspecified site: Secondary | ICD-10-CM

## 2016-09-20 NOTE — Therapy (Addendum)
[place], [place], [address] Phone: [phone]   Fax:  [phone]  Physical Therapy Treatment  Patient Details  Name: Jo Collins MRN: [id number] Date of Birth: May 27, 1950 Referring Provider: Dr. Sullivan Lone  Encounter Date: 09/20/2016      PT End of Session - 09/20/16 1207    Number of Visits 17   Date for PT Re-Evaluation 12/01/16   PT Start Time 1105   PT Stop Time 1150   PT Time Calculation (min) 45 min   Activity Tolerance Patient tolerated treatment well   Behavior During Therapy [facility] for tasks assessed/performed      Past Medical History:  Diagnosis Date  . GERD (gastroesophageal reflux disease)   . NHL (non-Hodgkin's lymphoma) [facility])     Past Surgical History:  Procedure Laterality Date  . ABDOMINAL HYSTERECTOMY    . IR FLUORO GUIDE PORT INSERTION RIGHT  08/18/2016  . IR THORACENTESIS ASP PLEURAL SPACE W/IMG GUIDE  08/07/2016  . IR THORACENTESIS ASP PLEURAL SPACE W/IMG GUIDE  08/14/2016  . IR US GUIDE VASC ACCESS RIGHT  08/18/2016    Vitals:   09/20/16 1109 09/20/16 1118 09/20/16 1127 09/20/16 1134  Pulse: 70 (!) 106 (!) 104 (!) 105  SpO2: 96% 92% 98% 96%        Subjective Assessment - 09/20/16 1109    Subjective "I'm not in the best of shape this morning.  I'm just feeling drained--no energy." Called the doctor to check on medication question from eval--the dexamethasone was from when she was in the hospital.   Currently in Pain? No/denies  not right now, but sometimes in her upper and lower back                         [facility] Adult PT Treatment/Exercise - 09/20/16 0001      Knee/Hip Exercises: Aerobic   Tread Mill 5 minutes mostly at 1 mph  SpO2 94, HR 112 following this; hard breathing just after     Knee/Hip Exercises: Standing   Heel Raises Both;20 reps;1 second  hands on raised mat for support   Knee Flexion AROM;Right;Left;10 reps  hands on raised  mat for support   Hip Flexion AROM;Right;Left;10 reps  one hand on raised mat for support; very mild SOB per pt.   Hip ADduction --   Hip Abduction AROM;Right;Left;10 reps  hands on raised mat for support   Hip Extension AROM;Right;Left;10 reps  hands on raised mat for UE support     Knee/Hip Exercises: Seated   Ball Squeeze 10 reps with small football, breathing out as she squeezed   Sit to [employer] 10 reps;without UE support  dyspnea scale hard just after this, but quickly to moderate     Shoulder Exercises: Seated   Row Strengthening;Both;20 reps;Theraband   Theraband Level (Shoulder Row) Level 1 (Yellow)   Other Seated Exercises 3-way arm raises with 1 lb. weight in each hand x 10 each way to shoulder height only   Other Seated Exercises lat pulldown vs. yellow Theraband (therapist holding it high) x 20 reps                PT Education - 09/20/16 1207    Education provided Yes   Education Details Okay to try home treadmill initially at no more than 1 mph for 5 minutes.   Person(s) Educated Patient   Methods Explanation   Comprehension Verbalized understanding;Returned demonstration  Short Term Clinic Goals - 09/18/16 1220      CC Short Term Goal  #1   Title Patient will be able to walk 3 minutes with shortness of breath rated at moderate or below on dyspnea scale.   Baseline On dyspnea scale, rated hard at eval.   Time 4   Period Weeks   Status New   Target Date 11/01/16     CC Short Term Goal  #2   Title Pt. will be independent with initial HEP for endurance.   Time 4   Period Weeks   Status New   Target Date 11/01/16             Long Term Clinic Goals - 09/18/16 1221      CC Long Term Goal  #1   Title Pt. will be able to walk the full six minutes of the 6 minute walk test with SOB at moderate or below on dyspnea scale.   Baseline walked 3 minutes with breathing hard on dyspnea scale   Time 8   Period Weeks   Status New   Target Date  12/01/16     CC Long Term Goal  #2   Title Pt. will be independent in HEP for endurance exercise.   Time 8   Period Weeks   Status New   Target Date 12/01/16     CC Long Term Goal  #3   Title Pt. will be knowledgeable about options for community exercise programs.   Time 8   Period Weeks   Status New   Target Date 12/01/16            Plan - 09/20/16 1208    Clinical Impression Statement Pt. did well with a combination of active LE and UE exercises as well as 5 mins on the treadmill at up to 1 mph today, with frequent monitored rest breaks and monitoring of her response to exercise.  She would report breathing on dyspnea scale to the level of hard, but it quickly reduced to moderate and then mild by her description.  O2 sats stayed in the 90s, and heart rate rose appropriately to about 110 bpm with exercise today. She was encouraged NOT to overdo activity, but she plans to try her home treadmill, which is motorized and has different speed options.   Rehab Potential Good   PT Frequency 2x / week   PT Duration 8 weeks   PT Treatment/Interventions ADLs/Self Care Home Management;Therapeutic exercise;Patient/family education   PT Next Visit Plan Continue endurance training with equipment available here, monitoring O2 sats and heart rate.  Do initial HEP instruction.   PT Home Exercise Plan treadmill at up to 1 mph for 5 minutes   Consulted and Agree with Plan of Care Patient      Patient will benefit from skilled therapeutic intervention in order to improve the following deficits and impairments:  Cardiopulmonary status limiting activity, Decreased activity tolerance, Decreased endurance  Visit Diagnosis: Other abnormalities of gait and mobility  Non-Hodgkin's lymphoma in adult [facility])     Problem List Patient Active Problem List   Diagnosis Date Noted  . DLBCL (diffuse large B cell lymphoma) ([place]) 08/17/2016  . Acute midline thoracic back pain   . Lymphadenopathy   . Morbid  obesity with BMI of 50.0-59.9, adult ([place])   . NHL (non-Hodgkin's lymphoma) ([place])   . Abdominal pain, RUQ   . Pleural effusion on right 08/07/2016  . Abnormal EKG 08/07/2016  . Intrathoracic  mass 08/07/2016  . Anemia 08/07/2016  . GERD (gastroesophageal reflux disease) 08/07/2016  . Shortness of breath 08/07/2016    Trevaris Pennella 09/20/2016, 12:11 PM  [place], [place], [address] Phone: [phone]   Fax:  [phone]  Name: Fionna Merriott MRN: [id number] Date of Birth: [birth year]  Serafina Royals, PT 09/20/16 12:12 PM  PHYSICAL THERAPY DISCHARGE SUMMARY  Visits from Start of Care: 2  Current functional level related to goals / functional outcomes: Pt. Is now deceased.      Education / Equipment: Home exercise program had been instructed.  Plan: Patient agrees to discharge.  Patient goals were not met. Patient is being discharged due to not returning since the last visit.  ?????    Patient did not return after this second therapy session.  She subsequently passed away.  Serafina Royals, PT 04/19/17 3:07 PM

## 2016-09-25 ENCOUNTER — Encounter (HOSPITAL_COMMUNITY): Payer: Self-pay | Admitting: Emergency Medicine

## 2016-09-25 ENCOUNTER — Inpatient Hospital Stay (HOSPITAL_COMMUNITY)
Admission: EM | Admit: 2016-09-25 | Discharge: 2016-09-28 | DRG: 187 | Disposition: A | Discharge status: Home / Self Care | Payer: Medicare Other | Attending: Internal Medicine | Admitting: Internal Medicine

## 2016-09-25 DIAGNOSIS — I480 Paroxysmal atrial fibrillation: Secondary | ICD-10-CM | POA: Diagnosis present

## 2016-09-25 DIAGNOSIS — J90 Pleural effusion, not elsewhere classified: Principal | ICD-10-CM

## 2016-09-25 DIAGNOSIS — G4730 Sleep apnea, unspecified: Secondary | ICD-10-CM | POA: Diagnosis present

## 2016-09-25 DIAGNOSIS — Z7952 Long term (current) use of systemic steroids: Secondary | ICD-10-CM

## 2016-09-25 DIAGNOSIS — G8929 Other chronic pain: Secondary | ICD-10-CM | POA: Diagnosis present

## 2016-09-25 DIAGNOSIS — M545 Low back pain, unspecified: Secondary | ICD-10-CM

## 2016-09-25 DIAGNOSIS — C8338 Diffuse large B-cell lymphoma, lymph nodes of multiple sites: Secondary | ICD-10-CM | POA: Diagnosis not present

## 2016-09-25 DIAGNOSIS — R0602 Shortness of breath: Secondary | ICD-10-CM

## 2016-09-25 DIAGNOSIS — E876 Hypokalemia: Secondary | ICD-10-CM | POA: Diagnosis present

## 2016-09-25 DIAGNOSIS — R0603 Acute respiratory distress: Secondary | ICD-10-CM | POA: Diagnosis not present

## 2016-09-25 DIAGNOSIS — Z9889 Other specified postprocedural states: Secondary | ICD-10-CM

## 2016-09-25 DIAGNOSIS — R0609 Other forms of dyspnea: Secondary | ICD-10-CM

## 2016-09-25 DIAGNOSIS — Z6841 Body Mass Index (BMI) 40.0 and over, adult: Secondary | ICD-10-CM

## 2016-09-25 DIAGNOSIS — Z87891 Personal history of nicotine dependence: Secondary | ICD-10-CM

## 2016-09-25 DIAGNOSIS — K219 Gastro-esophageal reflux disease without esophagitis: Secondary | ICD-10-CM | POA: Diagnosis present

## 2016-09-25 DIAGNOSIS — Z9221 Personal history of antineoplastic chemotherapy: Secondary | ICD-10-CM

## 2016-09-25 DIAGNOSIS — C8330 Diffuse large B-cell lymphoma, unspecified site: Secondary | ICD-10-CM | POA: Diagnosis present

## 2016-09-25 DIAGNOSIS — Z79899 Other long term (current) drug therapy: Secondary | ICD-10-CM

## 2016-09-25 HISTORY — DX: Acute respiratory distress: R06.03

## 2016-09-25 HISTORY — DX: Pleural effusion, not elsewhere classified: J90

## 2016-09-25 HISTORY — DX: Low back pain, unspecified: M54.50

## 2016-09-25 HISTORY — DX: Hypokalemia: E87.6

## 2016-09-25 LAB — CBC WITH DIFFERENTIAL/PLATELET
BASOS ABS: 0 10*3/uL (ref 0.0–0.1)
Basophils Relative: 0 %
EOS ABS: 0 10*3/uL (ref 0.0–0.7)
Eosinophils Relative: 0 %
HCT: 37.1 % (ref 36.0–46.0)
HEMOGLOBIN: 11.8 g/dL — AB (ref 12.0–15.0)
Lymphocytes Relative: 16 %
Lymphs Abs: 2 10*3/uL (ref 0.7–4.0)
MCH: 27.8 pg (ref 26.0–34.0)
MCHC: 31.8 g/dL (ref 30.0–36.0)
MCV: 87.3 fL (ref 78.0–100.0)
Monocytes Absolute: 0.3 10*3/uL (ref 0.1–1.0)
Monocytes Relative: 2 %
Neutro Abs: 10.5 10*3/uL — ABNORMAL HIGH (ref 1.7–7.7)
Neutrophils Relative %: 82 %
Platelets: 170 10*3/uL (ref 150–400)
RBC: 4.25 MIL/uL (ref 3.87–5.11)
RDW: 16 % — ABNORMAL HIGH (ref 11.5–15.5)
WBC: 12.8 10*3/uL — ABNORMAL HIGH (ref 4.0–10.5)

## 2016-09-25 LAB — PROTIME-INR
INR: 1.1
PROTHROMBIN TIME: 14.1 s (ref 11.4–15.2)

## 2016-09-25 LAB — COMPREHENSIVE METABOLIC PANEL
ALBUMIN: 3.7 g/dL (ref 3.5–5.0)
ALT: 21 U/L (ref 14–54)
AST: 20 U/L (ref 15–41)
Alkaline Phosphatase: 74 U/L (ref 38–126)
Anion gap: 9 (ref 5–15)
BUN: 8 mg/dL (ref 6–20)
CHLORIDE: 102 mmol/L (ref 101–111)
CO2: 29 mmol/L (ref 22–32)
CREATININE: 0.92 mg/dL (ref 0.44–1.00)
Calcium: 9 mg/dL (ref 8.9–10.3)
GFR calc Af Amer: >60 mL/min (ref >60)
GLUCOSE: 123 mg/dL — AB (ref 65–99)
POTASSIUM: 2.8 mmol/L — AB (ref 3.5–5.1)
Sodium: 140 mmol/L (ref 135–145)
Total Bilirubin: <0.1 mg/dL — ABNORMAL LOW (ref 0.3–1.2)
Total Protein: 7.1 g/dL (ref 6.5–8.1)

## 2016-09-25 LAB — APTT: APTT: 28 s (ref 24–36)

## 2016-09-25 LAB — TROPONIN I

## 2016-09-25 MED ORDER — MORPHINE SULFATE (PF) 2 MG/ML IV SOLN: 2.0000 mg | INTRAVENOUS | Status: DC | PRN

## 2016-09-25 MED ORDER — POTASSIUM CHLORIDE CRYS ER 20 MEQ PO TBCR
40.0000 meq | EXTENDED_RELEASE_TABLET | Freq: Once | ORAL | Status: AC
  Administered 2016-09-25: 40 meq via ORAL
  Filled 2016-09-25: qty 2

## 2016-09-25 MED ORDER — ADULT MULTIVITAMIN W/MINERALS CH
1.0000 | ORAL_TABLET | Freq: Every day | ORAL | Status: DC
  Administered 2016-09-26 – 2016-09-28 (×3): 1 via ORAL
  Filled 2016-09-25 (×3): qty 1

## 2016-09-25 MED ORDER — SODIUM CHLORIDE 0.9% FLUSH
3.0000 mL | Freq: Two times a day (BID) | INTRAVENOUS | Status: DC
  Administered 2016-09-26 – 2016-09-27 (×3): 3 mL via INTRAVENOUS

## 2016-09-25 MED ORDER — ENOXAPARIN SODIUM 40 MG/0.4ML ~~LOC~~ SOLN
40.0000 mg | SUBCUTANEOUS | Status: DC
  Administered 2016-09-25 – 2016-09-27 (×3): 40 mg via SUBCUTANEOUS
  Filled 2016-09-25 (×3): qty 0.4

## 2016-09-25 MED ORDER — HYDROCODONE-ACETAMINOPHEN 5-325 MG PO TABS: 1.0000 | ORAL_TABLET | ORAL | Status: DC | PRN

## 2016-09-25 MED ORDER — ONDANSETRON HCL 4 MG/2ML IJ SOLN: 4.0000 mg | Freq: Four times a day (QID) | INTRAMUSCULAR | Status: DC | PRN

## 2016-09-25 MED ORDER — MORPHINE SULFATE (PF) 4 MG/ML IV SOLN: 2.0000 mg | INTRAVENOUS | Status: DC | PRN

## 2016-09-25 MED ORDER — ACETAMINOPHEN 650 MG RE SUPP: 650.0000 mg | Freq: Four times a day (QID) | RECTAL | Status: DC | PRN

## 2016-09-25 MED ORDER — SODIUM CHLORIDE 0.9% FLUSH
3.0000 mL | Freq: Two times a day (BID) | INTRAVENOUS | Status: DC
  Administered 2016-09-25 – 2016-09-28 (×5): 3 mL via INTRAVENOUS

## 2016-09-25 MED ORDER — MAGNESIUM SULFATE IN D5W 1-5 GM/100ML-% IV SOLN
1.0000 g | Freq: Once | INTRAVENOUS | Status: AC
  Administered 2016-09-25: 1 g via INTRAVENOUS
  Filled 2016-09-25: qty 100

## 2016-09-25 MED ORDER — ONDANSETRON HCL 4 MG PO TABS: 4.0000 mg | ORAL_TABLET | Freq: Four times a day (QID) | ORAL | Status: DC | PRN

## 2016-09-25 MED ORDER — ALBUTEROL SULFATE (2.5 MG/3ML) 0.083% IN NEBU
5.0000 mg | INHALATION_SOLUTION | Freq: Once | RESPIRATORY_TRACT | Status: AC
  Administered 2016-09-25: 5 mg via RESPIRATORY_TRACT
  Filled 2016-09-25: qty 6

## 2016-09-25 MED ORDER — ACETAMINOPHEN 325 MG PO TABS
650.0000 mg | ORAL_TABLET | Freq: Four times a day (QID) | ORAL | Status: DC | PRN
  Administered 2016-09-25 – 2016-09-26 (×2): 650 mg via ORAL
  Filled 2016-09-25 (×2): qty 2

## 2016-09-25 MED ORDER — POTASSIUM CHLORIDE 10 MEQ/100ML IV SOLN
10.0000 meq | INTRAVENOUS | Status: AC
  Administered 2016-09-25 – 2016-09-26 (×3): 10 meq via INTRAVENOUS
  Filled 2016-09-25 (×3): qty 100

## 2016-09-25 MED ORDER — POLYETHYLENE GLYCOL 3350 17 G PO PACK: 17.0000 g | PACK | Freq: Every day | ORAL | Status: DC | PRN

## 2016-09-25 MED ORDER — DIPHENHYDRAMINE HCL 25 MG PO CAPS
25.0000 mg | ORAL_CAPSULE | Freq: Every day | ORAL | Status: DC | PRN
  Administered 2016-09-25 – 2016-09-26 (×2): 25 mg via ORAL
  Filled 2016-09-25 (×2): qty 1

## 2016-09-25 MED ORDER — HYDRALAZINE HCL 20 MG/ML IJ SOLN: 10.0000 mg | INTRAMUSCULAR | Status: DC | PRN

## 2016-09-25 MED ORDER — SODIUM CHLORIDE 0.9 % IV SOLN: 250.0000 mL | INTRAVENOUS | Status: DC | PRN

## 2016-09-25 MED ORDER — CYANOCOBALAMIN 500 MCG PO TABS
500.0000 ug | ORAL_TABLET | ORAL | Status: DC
  Administered 2016-09-26 – 2016-09-28 (×2): 500 ug via ORAL
  Filled 2016-09-25 (×2): qty 1

## 2016-09-25 MED ORDER — SODIUM CHLORIDE 0.9% FLUSH: 3.0000 mL | INTRAVENOUS | Status: DC | PRN

## 2016-09-25 NOTE — ED Triage Notes (Signed)
Pt comes in from her cancer doctor with complaints of SOB over the past 3 days.  Pt saturating well in triage.  Also has a congested cough.  They took x-ray's at [place] office today and patient brought disc with her for viewing.  Reports "they said something about fluid on my lungs". Pt ambulatory but exhibits dyspnea and rest and with exertion. A&O x4.

## 2016-09-25 NOTE — ED Notes (Signed)
Please call Katie with report at 2055. Phone # [phone]

## 2016-09-25 NOTE — ED Notes (Signed)
Bed: [facility]1 Expected date:  Expected time:  Means of arrival:  Comments: 

## 2016-09-25 NOTE — H&P (Signed)
History and Physical    Sanjuanita Condrey CBJ:[id number] DOB: 1950/05/02 DOA: 09/25/2016  PCP: Deland Pretty, MD   Patient coming from: Home  Chief Complaint: SOB   HPI: Jo Collins is a 66 y.o. female with medical history significant for non-Hodgkin's lymphoma diagnosed recently and started on R CHOP, now presenting to the emergency department for shortness of breath. Patient was admitted to the hospital a little over a month ago with shortness of breath, was noted to have a right pleural effusion, underwent thoracentesis with resolution of her symptoms, but unfortunately was diagnosed with non-Hodgkin's lymphoma at that time. She has since begun chemotherapy and had been doing well from a respiratory perspective until the insidious development of dyspnea over the past couple weeks. She denies fevers but reports chills. She states that over the past 4 days, she is gotten progressively dyspneic and unable to perform her usual activities due to this. She had not been coughing until earlier today when she developed a mild cough productive of scant clear sputum. Denies chest pain or palpitations. Denies lower extremity swelling or orthopnea.  ED Course: Upon arrival to the ED, patient is found to be saturating adequately on room air while at rest, hypertensive, but otherwise stable. EKG features a sinus rhythm with low voltage QRS. Chest x-ray, performed at an outside facility just prior to arrival, reveals a large right pleural effusion with underlying atelectasis versus infiltrate. Chemistry panel reveals a potassium of 2.8 and CBC is notable for a leukocytosis to 12,800. She was treated with continuous albuterol neb. She remained hemodynamically stable and in only mild respiratory distress while at rest in her bed. She will be admitted to the telemetry unit for ongoing evaluation and management of dyspnea secondary to recurrent right pleural effusion.  Review of Systems:  All other systems reviewed and  apart from HPI, are negative.  Past Medical History:  Diagnosis Date  . GERD (gastroesophageal reflux disease)   . NHL (non-Hodgkin's lymphoma) [facility])     Past Surgical History:  Procedure Laterality Date  . ABDOMINAL HYSTERECTOMY    . IR FLUORO GUIDE PORT INSERTION RIGHT  08/18/2016  . IR THORACENTESIS ASP PLEURAL SPACE W/IMG GUIDE  08/07/2016  . IR THORACENTESIS ASP PLEURAL SPACE W/IMG GUIDE  08/14/2016  . IR US GUIDE VASC ACCESS RIGHT  08/18/2016     reports that she quit smoking about 8 weeks ago. Her smoking use included Cigarettes. She has a 23.50 pack-year smoking history. She has never used smokeless tobacco. She reports that she does not drink alcohol. Her drug history is not on file.  No Known Allergies  Family History  Problem Relation Age of Onset  . COPD Mother   . Diabetes Mellitus II Sister   . Diabetes Mellitus II Brother   . Diabetes Mellitus II Sister   . Stroke Sister   . Hypertension Sister   . Diabetes Mellitus II Sister      Prior to Admission medications   Medication Sig Start Date End Date Taking? Authorizing Provider  acetaminophen (TYLENOL) 500 MG tablet Take 1,000 mg by mouth every 6 (six) hours as needed for moderate pain.   Yes [provider]  diphenhydrAMINE (BENADRYL) 25 MG tablet Take 25 mg by mouth daily as needed for allergies.   Yes [provider]  lidocaine-prilocaine (EMLA) cream Apply 1 application topically as needed. Apply to port-a-cath assess site 45-60 mins prior to chemotherapy. 08/22/16  Yes Brunetta Genera, MD  loratadine (CLARITIN) 10 MG tablet  Take 10 mg by mouth daily as needed for allergies.   Yes [provider]  Multiple Vitamin (MULTIVITAMIN WITH MINERALS) TABS tablet Take 1 tablet by mouth daily.   Yes [provider]  polyethylene glycol (MIRALAX / GLYCOLAX) packet Take 17 g by mouth daily. Patient taking differently: Take 17 g by mouth daily as needed for moderate constipation.   08/15/16  Yes Dhungel, Nishant, MD  predniSONE (DELTASONE) 20 MG tablet Take 3 tablets (60 mg total) by mouth daily. Take on days 1-5 of chemotherapy. 08/22/16  Yes Brunetta Genera, MD  prochlorperazine (COMPAZINE) 10 MG tablet Take 1 tablet (10 mg total) by mouth every 6 (six) hours as needed (Nausea or vomiting). 08/22/16  Yes Brunetta Genera, MD  vitamin B-12 500 MCG tablet Take 1 tablet (500 mcg total) by mouth daily. Patient taking differently: Take 500 mcg by mouth every other day.  08/16/16  Yes Dhungel, Flonnie Overman, MD  lidocaine-prilocaine (EMLA) cream Apply to affected area once 08/22/16   Brunetta Genera, MD    Physical Exam: Vitals:   09/25/16 1834  BP: (!) 151/112  Pulse: 87  Resp: 16  SpO2: 96%      Constitutional: Tachypneic, dyspneic with speech, calm, no pallor, no diaphoresis  Eyes: PERTLA, lids and conjunctivae normal ENMT: Mucous membranes are moist. Posterior pharynx clear of any exudate or lesions.   Neck: normal, supple, no masses, no thyromegaly Respiratory: Diminished to absent breath sounds on right. Dyspnea with speech. Increased WOB. No pallor or cyanosis.   Cardiovascular: S1 & S2 heard, regular rate and rhythm. No significant JVD. Abdomen: No distension, no tenderness, no masses palpated. Bowel sounds normal.  Musculoskeletal: no clubbing / cyanosis. No joint deformity upper and lower extremities.   Skin: no significant rashes, lesions, ulcers. Warm, dry, well-perfused. Neurologic: CN 2-12 grossly intact. Sensation intact. Strength 5/5 in all 4 limbs.  Psychiatric: Alert and oriented x 3. Pleasant, cooperative.     Labs on Admission: I have personally reviewed following labs and imaging studies  CBC:  Recent Labs Lab 09/25/16 1807  WBC 12.8*  NEUTROABS 10.5*  HGB 11.8*  HCT 37.1  MCV 87.3  PLT 161   Basic Metabolic Panel:  Recent Labs Lab 09/25/16 1807  NA 140  K 2.8*  CL 102  CO2 29  GLUCOSE 123*  BUN 8  CREATININE 0.92    CALCIUM 9.0   GFR: Estimated Creatinine Clearance: 78.7 mL/min (by C-G formula based on SCr of 0.92 mg/dL). Liver Function Tests:  Recent Labs Lab 09/25/16 1807  AST 20  ALT 21  ALKPHOS 74  BILITOT <0.1*  PROT 7.1  ALBUMIN 3.7   No results for input(s): LIPASE, AMYLASE in the last 168 hours. No results for input(s): AMMONIA in the last 168 hours. Coagulation Profile:  Recent Labs Lab 09/25/16 1810  INR 1.10   Cardiac Enzymes:  Recent Labs Lab 09/25/16 1807  TROPONINI <0.03   BNP (last 3 results) No results for input(s): PROBNP in the last 8760 hours. HbA1C: No results for input(s): HGBA1C in the last 72 hours. CBG: No results for input(s): GLUCAP in the last 168 hours. Lipid Profile: No results for input(s): CHOL, HDL, LDLCALC, TRIG, CHOLHDL, LDLDIRECT in the last 72 hours. Thyroid Function Tests: No results for input(s): TSH, T4TOTAL, FREET4, T3FREE, THYROIDAB in the last 72 hours. Anemia Panel: No results for input(s): VITAMINB12, FOLATE, FERRITIN, TIBC, IRON, RETICCTPCT in the last 72 hours. Urine analysis: No results found for: COLORURINE, APPEARANCEUR,  LABSPEC, PHURINE, GLUCOSEU, HGBUR, BILIRUBINUR, KETONESUR, PROTEINUR, UROBILINOGEN, NITRITE, LEUKOCYTESUR Sepsis Labs: @LABRCNTIP (procalcitonin:4,lacticidven:4) )No results found for this or any previous visit (from the past 240 hour(s)).   Radiological Exams on Admission: No results found.  EKG: Independently reviewed. Sinus rhythm, low-voltage QRS.   Assessment/Plan  1. Recurrent right pleural effusion; acute respiratory distress - Pt presents with dyspnea, developing insidiously over the past couple weeks and with symptoms at rest for the past few days   - She is found to have recurrence in right pleural effusion that accounts for the sxs  - Oncologist had suspected that this was related to her lymphoma during the recent admission for the same  - US-guided thoracentesis requested, anticipate  improvement/resolution of presenting complaints with this  - Continue supportive care with prn supplemental O2    2. Hypokalemia  - Serum potassium is 2.8 on admission - Treated with 40 mEq oral, and 30 mEq IV potassium  - Continue cardiac monitoring, repeat chem panel in am   3. Non-Hodgkin lymphoma  - Pt is followed by oncology with recent diagnosis of diffuse large B-cell lymphoma, recently started on R-CHOP  - Will notify oncologist of the admission    DVT prophylaxis: sq Lovenox Code Status: Full  Family Communication: Husband and daughter updated at bedside at patient's request  Disposition Plan: Observe on telemetry Consults called: None Admission status: Observation    Vianne Bulls, MD [facility] Pager [phone]  If 7PM-7AM, please contact night-coverage [email or web address] Password [facility]  09/25/2016, 8:04 PM

## 2016-09-25 NOTE — ED Provider Notes (Signed)
[place] [place] DEPT Provider Note   CSN: [id number] Arrival date & time: 09/25/16  1328     History   Chief Complaint Chief Complaint  Patient presents with  . Shortness of Breath  . Chemo Patient    HPI Tiegan Jambor is a 66 y.o. female.  Pt presents to the ED today with sob.  Pt has a hx of nonhodgkin's lymphoma.  This is newly diagnosed in August when she came into the ED with sob and had a right pleural effusion.  She had her last chemo on 9/12 with Dr. Irene Limbo and neulasta shot on 9/14.  The pt said that she has had increasing sob.  She went to her pcp today who took a CXR and told her to come to the ED.  The pt denies f/c.          Past Medical History:  Diagnosis Date  . GERD (gastroesophageal reflux disease)   . NHL (non-Hodgkin's lymphoma) [facility])     Patient Active Problem List   Diagnosis Date Noted  . Acute respiratory distress 09/25/2016  . Chronic low back pain 09/25/2016  . Hypokalemia 09/25/2016  . DLBCL (diffuse large B cell lymphoma) ([place]) 08/17/2016  . Acute midline thoracic back pain   . Lymphadenopathy   . Morbid obesity with BMI of 50.0-59.9, adult ([place])   . NHL (non-Hodgkin's lymphoma) ([place])   . Abdominal pain, RUQ   . Pleural effusion on right 08/07/2016  . Abnormal EKG 08/07/2016  . 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 Anemia 08/07/2016  . GERD (gastroesophageal reflux disease) 08/07/2016  . Shortness of breath 08/07/2016    Past Surgical History:  Procedure Laterality Date  . ABDOMINAL HYSTERECTOMY    . IR FLUORO GUIDE PORT INSERTION RIGHT  08/18/2016  . IR THORACENTESIS ASP PLEURAL SPACE W/IMG GUIDE  08/07/2016  . IR THORACENTESIS ASP PLEURAL SPACE W/IMG GUIDE  08/14/2016  . IR US GUIDE VASC ACCESS RIGHT  08/18/2016    OB History    No data available       Home Medications    Prior to Admission medications   Medication Sig Start Date End Date Taking? Authorizing Provider  acetaminophen (TYLENOL) 500 MG tablet Take 1,000 mg by mouth  every 6 (six) hours as needed for moderate pain.   Yes [provider]  diphenhydrAMINE (BENADRYL) 25 MG tablet Take 25 mg by mouth daily as needed for allergies.   Yes [provider]  lidocaine-prilocaine (EMLA) cream Apply 1 application topically as needed. Apply to port-a-cath assess site 45-60 mins prior to chemotherapy. 08/22/16  Yes Brunetta Genera, MD  loratadine (CLARITIN) 10 MG tablet Take 10 mg by mouth daily as needed for allergies.   Yes [provider]  Multiple Vitamin (MULTIVITAMIN WITH MINERALS) TABS tablet Take 1 tablet by mouth daily.   Yes [provider]  polyethylene glycol (MIRALAX / GLYCOLAX) packet Take 17 g by mouth daily. Patient taking differently: Take 17 g by mouth daily as needed for moderate constipation.  08/15/16  Yes Dhungel, Nishant, MD  predniSONE (DELTASONE) 20 MG tablet Take 3 tablets (60 mg total) by mouth daily. Take on days 1-5 of chemotherapy. 08/22/16  Yes Brunetta Genera, MD  prochlorperazine (COMPAZINE) 10 MG tablet Take 1 tablet (10 mg total) by mouth every 6 (six) hours as needed (Nausea or vomiting). 08/22/16  Yes Brunetta Genera, MD  vitamin B-12 500 MCG tablet Take 1 tablet (500 mcg total) by mouth daily. Patient taking differently: Take  500 mcg by mouth every other day.  08/16/16  Yes Dhungel, Nishant, MD  allopurinol (ZYLOPRIM) 100 MG tablet Take 1 tablet (100 mg total) by mouth 2 (two) times daily. Patient not taking: Reported on 09/18/2016 08/22/16   Brunetta Genera, MD  dexamethasone (DECADRON) 4 MG tablet Take 2 tablets (8 mg total) by mouth daily. Patient not taking: Reported on 09/18/2016 08/16/16   Dhungel, Flonnie Overman, MD  lidocaine-prilocaine (EMLA) cream Apply to affected area once 08/22/16   Brunetta Genera, MD  ondansetron (ZOFRAN) 8 MG tablet Take 1 tablet (8 mg total) by mouth 2 (two) times daily as needed for refractory nausea / vomiting. Start on day 3 after cyclophosphamide rx Patient  not taking: Reported on 09/18/2016 08/22/16   Brunetta Genera, MD  oxyCODONE-acetaminophen (PERCOCET/ROXICET) 5-325 MG tablet Take 2 tablets by mouth every 4 (four) hours as needed for moderate pain. Patient not taking: Reported on 09/18/2016 08/15/16   Louellen Molder, MD    Family History Family History  Problem Relation Age of Onset  . COPD Mother   . Diabetes Mellitus II Sister   . Diabetes Mellitus II Brother   . Diabetes Mellitus II Sister   . Stroke Sister   . Hypertension Sister   . Diabetes Mellitus II Sister     Social History Social History  Substance Use Topics  . Smoking status: Former Smoker    Packs/day: 0.50    Years: 47.00    Types: Cigarettes    Quit date: 07/28/2016  . Smokeless tobacco: Never Used  . Alcohol use No     Allergies   Patient has no known allergies.   Review of Systems Review of Systems  Respiratory: Positive for shortness of breath.   All other systems reviewed and are negative.    Physical Exam Updated Vital Signs BP (!) 151/112 (BP Location: Right Arm)   Pulse 87   Resp 16   SpO2 96%   Physical Exam  Constitutional: She is oriented to person, place, and time. She appears well-developed. She appears distressed.  HENT:  Head: Normocephalic and atraumatic.  Right Ear: External ear normal.  Left Ear: External ear normal.  Nose: Nose normal.  Mouth/Throat: Oropharynx is clear and moist.  Eyes: Pupils are equal, round, and reactive to light. Conjunctivae and EOM are normal.  Neck: Normal range of motion. Neck supple.  Cardiovascular: Normal heart sounds and intact distal pulses.  Tachycardia present.   Pulmonary/Chest: Breath sounds normal. Tachypnea noted. She is in respiratory distress.  Abdominal: Soft. Bowel sounds are normal.  Musculoskeletal: Normal range of motion.  Neurological: She is alert and oriented to person, place, and time.  Skin: Skin is warm. Capillary refill takes less than 2 seconds.  Psychiatric: She  has a normal mood and affect. Her behavior is normal. Judgment and thought content normal.  Nursing note and vitals reviewed.    ED Treatments / Results  Labs (all labs ordered are listed, but only abnormal results are displayed) Labs Reviewed  COMPREHENSIVE METABOLIC PANEL - Abnormal; Notable for the following:       Result Value   Potassium 2.8 (*)    Glucose, Bld 123 (*)    Total Bilirubin <0.1 (*)    All other components within normal limits  CBC WITH DIFFERENTIAL/PLATELET - Abnormal; Notable for the following:    WBC 12.8 (*)    Hemoglobin 11.8 (*)    RDW 16.0 (*)    Neutro Abs 10.5 (*)  All other components within normal limits  TROPONIN I  PROTIME-INR  APTT  URINALYSIS, ROUTINE W REFLEX MICROSCOPIC    EKG  EKG Interpretation  Date/Time:  Monday September 25 2016 14:19:54 EDT Ventricular Rate:  83 PR Interval:    QRS Duration: 86 QT Interval:  364 QTC Calculation: 428 R Axis:   43 Text Interpretation:  Sinus rhythm Low voltage, precordial leads Confirmed by Isla Pence [phone]) on 09/25/2016 5:21:26 PM       Radiology No results found.   CXR today:  Large right pleural effusion   Procedures Procedures (including critical care time)  Medications Ordered in ED Medications  albuterol (PROVENTIL) (2.5 MG/3ML) 0.083% nebulizer solution 5 mg (5 mg Nebulization Given 09/25/16 1432)     Initial Impression / Assessment and Plan / ED Course  I have reviewed the triage vital signs and the nursing notes.  Pertinent labs & imaging results that were available during my care of the patient were reviewed by me and considered in my medical decision making (see chart for details).    When pt ambulates, HR goes up to 150 and she becomes significantly sob.   Pt d/w Dr. Myna Hidalgo ([facility]) for admission. Final Clinical Impressions(s) / ED Diagnoses   Final diagnoses:  Pleural effusion, right  Diffuse large B-cell lymphoma of lymph nodes of multiple regions (HCC)    Dyspnea on exertion    New Prescriptions New Prescriptions   No medications on file     Isla Pence, MD 09/25/16 1958

## 2016-09-26 ENCOUNTER — Ambulatory Visit (HOSPITAL_COMMUNITY): Payer: Medicare Other

## 2016-09-26 ENCOUNTER — Observation Stay (HOSPITAL_COMMUNITY): Payer: Medicare Other

## 2016-09-26 DIAGNOSIS — G4730 Sleep apnea, unspecified: Secondary | ICD-10-CM | POA: Diagnosis present

## 2016-09-26 DIAGNOSIS — E876 Hypokalemia: Secondary | ICD-10-CM | POA: Diagnosis present

## 2016-09-26 DIAGNOSIS — K219 Gastro-esophageal reflux disease without esophagitis: Secondary | ICD-10-CM | POA: Diagnosis present

## 2016-09-26 DIAGNOSIS — G8929 Other chronic pain: Secondary | ICD-10-CM | POA: Diagnosis present

## 2016-09-26 DIAGNOSIS — R0602 Shortness of breath: Secondary | ICD-10-CM | POA: Diagnosis present

## 2016-09-26 DIAGNOSIS — Z87891 Personal history of nicotine dependence: Secondary | ICD-10-CM | POA: Diagnosis not present

## 2016-09-26 DIAGNOSIS — Z6841 Body Mass Index (BMI) 40.0 and over, adult: Secondary | ICD-10-CM | POA: Diagnosis not present

## 2016-09-26 DIAGNOSIS — M545 Low back pain: Secondary | ICD-10-CM | POA: Diagnosis present

## 2016-09-26 DIAGNOSIS — I480 Paroxysmal atrial fibrillation: Secondary | ICD-10-CM | POA: Diagnosis present

## 2016-09-26 DIAGNOSIS — Z9221 Personal history of antineoplastic chemotherapy: Secondary | ICD-10-CM | POA: Diagnosis not present

## 2016-09-26 DIAGNOSIS — R0603 Acute respiratory distress: Secondary | ICD-10-CM | POA: Diagnosis present

## 2016-09-26 DIAGNOSIS — C8338 Diffuse large B-cell lymphoma, lymph nodes of multiple sites: Secondary | ICD-10-CM | POA: Diagnosis present

## 2016-09-26 DIAGNOSIS — Z79899 Other long term (current) drug therapy: Secondary | ICD-10-CM | POA: Diagnosis not present

## 2016-09-26 DIAGNOSIS — R0609 Other forms of dyspnea: Secondary | ICD-10-CM | POA: Diagnosis present

## 2016-09-26 DIAGNOSIS — Z7952 Long term (current) use of systemic steroids: Secondary | ICD-10-CM | POA: Diagnosis not present

## 2016-09-26 DIAGNOSIS — M5489 Other dorsalgia: Secondary | ICD-10-CM | POA: Diagnosis not present

## 2016-09-26 DIAGNOSIS — J90 Pleural effusion, not elsewhere classified: Secondary | ICD-10-CM | POA: Diagnosis present

## 2016-09-26 HISTORY — DX: Shortness of breath: R06.02

## 2016-09-26 LAB — CBC WITH DIFFERENTIAL/PLATELET
BASOS ABS: 0.1 10*3/uL (ref 0.0–0.1)
Basophils Relative: 1 %
EOS ABS: 0 10*3/uL (ref 0.0–0.7)
EOS PCT: 0 %
HCT: 36 % (ref 36.0–46.0)
Hemoglobin: 11.4 g/dL — ABNORMAL LOW (ref 12.0–15.0)
LYMPHS ABS: 1 10*3/uL (ref 0.7–4.0)
Lymphocytes Relative: 8 %
MCH: 27.5 pg (ref 26.0–34.0)
MCHC: 31.7 g/dL (ref 30.0–36.0)
MCV: 87 fL (ref 78.0–100.0)
MONO ABS: 2 10*3/uL — AB (ref 0.1–1.0)
Monocytes Relative: 16 %
NEUTROS PCT: 75 %
Neutro Abs: 9.4 10*3/uL — ABNORMAL HIGH (ref 1.7–7.7)
PLATELETS: 192 10*3/uL (ref 150–400)
RBC: 4.14 MIL/uL (ref 3.87–5.11)
RDW: 16.1 % — AB (ref 11.5–15.5)
WBC: 12.5 10*3/uL — AB (ref 4.0–10.5)

## 2016-09-26 LAB — EXPECTORATED SPUTUM ASSESSMENT W REFEX TO RESP CULTURE

## 2016-09-26 LAB — BODY FLUID CELL COUNT WITH DIFFERENTIAL
Lymphs, Fluid: 87 %
Monocyte-Macrophage-Serous Fluid: 11 % — ABNORMAL LOW (ref 50–90)
NEUTROPHIL FLUID: 2 % (ref 0–25)
WBC FLUID: 1488 uL — AB (ref 0–1000)

## 2016-09-26 LAB — URINALYSIS, ROUTINE W REFLEX MICROSCOPIC
BILIRUBIN URINE: NEGATIVE
GLUCOSE, UA: NEGATIVE mg/dL
Hgb urine dipstick: NEGATIVE
KETONES UR: NEGATIVE mg/dL
Leukocytes, UA: NEGATIVE
NITRITE: NEGATIVE
PH: 5 (ref 5.0–8.0)
Protein, ur: 30 mg/dL — AB
SPECIFIC GRAVITY, URINE: 1.026 (ref 1.005–1.030)

## 2016-09-26 LAB — BASIC METABOLIC PANEL
Anion gap: 10 (ref 5–15)
BUN: 8 mg/dL (ref 6–20)
CALCIUM: 8.8 mg/dL — AB (ref 8.9–10.3)
CO2: 26 mmol/L (ref 22–32)
CREATININE: 0.94 mg/dL (ref 0.44–1.00)
Chloride: 105 mmol/L (ref 101–111)
GFR calc Af Amer: >60 mL/min (ref >60)
GLUCOSE: 125 mg/dL — AB (ref 65–99)
POTASSIUM: 3.7 mmol/L (ref 3.5–5.1)
Sodium: 141 mmol/L (ref 135–145)

## 2016-09-26 LAB — MAGNESIUM: Magnesium: 2 mg/dL (ref 1.7–2.4)

## 2016-09-26 LAB — EXPECTORATED SPUTUM ASSESSMENT W GRAM STAIN, RFLX TO RESP C

## 2016-09-26 MED ORDER — FLUTICASONE PROPIONATE 50 MCG/ACT NA SUSP
1.0000 | Freq: Every day | NASAL | Status: DC
  Administered 2016-09-26 – 2016-09-28 (×3): 1 via NASAL
  Filled 2016-09-26: qty 16

## 2016-09-26 MED ORDER — LIDOCAINE HCL 2 % IJ SOLN
INTRAMUSCULAR | Status: AC
  Filled 2016-09-26: qty 10

## 2016-09-26 MED ORDER — DIPHENHYDRAMINE HCL 25 MG PO CAPS
25.0000 mg | ORAL_CAPSULE | Freq: Two times a day (BID) | ORAL | Status: DC
  Administered 2016-09-26 – 2016-09-28 (×4): 25 mg via ORAL
  Filled 2016-09-26 (×4): qty 1

## 2016-09-26 NOTE — Progress Notes (Signed)
Patient had brief episode of a.fib, K. Schorr NP notified. Currently back in sinus rhythm at rate of 78. Will continue to monitor.

## 2016-09-26 NOTE — Progress Notes (Signed)
Per Dr. Myna Hidalgo give IV magnesium.

## 2016-09-26 NOTE — Procedures (Signed)
Ultrasound-guided diagnostic and therapeutic right thoracentesis performed yielding 1.2 liters of slightly blood-tinged,chylous/milky fluid. No immediate complications. Follow-up chest x-ray pending.The fluid was sent to the lab for preordered studies. Only the above amount of fluid was removed today secondary to persistent pt coughing.

## 2016-09-26 NOTE — Progress Notes (Signed)
[facility] PROGRESS NOTE  Illiana Collins TDD:[id number] DOB: 1950-05-27 DOA: 09/25/2016 PCP: Deland Pretty, MD  Brief summary   66 y.o. female with medical history significant for non-Hodgkin's lymphoma diagnosed recently and started on R CHOP, now presenting to the emergency department for shortness of breath. Patient was admitted to the hospital a little over a month ago with shortness of breath, was noted to have a right pleural effusion, underwent thoracentesis with resolution of her symptoms, but unfortunately was diagnosed with non-Hodgkin's lymphoma at that time. She has since begun chemotherapy and had been doing well from a respiratory perspective until the insidious development of dyspnea over the past couple weeks. She denies fevers but reports chills. She states that over the past 4 days, she is gotten progressively dyspneic and unable to perform her usual activities due to this. She had not been coughing until earlier today when she developed a mild cough productive of scant clear sputum. Denies chest pain or palpitations. Denies lower extremity swelling or orthopnea.  ED Course: Upon arrival to the ED, patient is found to be saturating adequately on room air while at rest, hypertensive, but otherwise stable. EKG features a sinus rhythm with low voltage QRS. Chest x-ray, performed at an outside facility just prior to arrival, reveals a large right pleural effusion with underlying atelectasis versus infiltrate. Chemistry panel reveals a potassium of 2.8 and CBC is notable for a leukocytosis to 12,800. She was treated with continuous albuterol neb. She remained hemodynamically stable and in only mild respiratory distress while at rest in her bed. She will be admitted to the telemetry unit for ongoing evaluation and management of dyspnea secondary to recurrent right pleural effusion. Noted PAF overnight   Assessment/Plan:  Recurrent right pleural effusion; acute respiratory distress.  Oncologist had suspected that this was related to her lymphoma during the recent admission for the same  - underwent US-guided thoracentesis today only 1.2L removal due to persistent cough. Repeat CXR: No pneumothorax postthoracentesis. Removal of significant amount ofright pleural effusion . Persistent right base atelectasis and mildto moderate right pleural effusion  -will try to remove more fluid AM, repeat therapeutic thoracentesis. Pend plural fluid analysis     Hypokalemia.  Replaced. Monitor  Non-Hodgkin lymphoma. Pt is followed by oncology with recent diagnosis of diffuse large B-cell lymphoma, recently started on R-CHOP. Need to f/u as scheduled to cont Rx  PAF. Resolved. Similar  episode of PAF on last admission with pleural effusion. Echo (08/2016); LVEF 60-65%. D/w patient, her husband at length regarding risk of stroke and anticoagulation. chads vasc score is 2. She would like to think about anticoagulation . Will monitor on tele. Will start oral BB if needed  Code Status: full Family Communication: d/w patient, her husband (indicate person spoken with, relationship, and if by phone, the number) Disposition Plan: home 24-48 hrs    Consultants:  IR  Procedures:  thoracentesis on 9/25  Antibiotics:  none (indicate start date, and stop date if known)  HPI/Subjective: Alert. Reports feeling somewhat better after thoracenthesis. Afebrile. Wants to eat. D/w her and her husband regarding PAF. They would like to think about anticoagulation   Objective: Vitals:   09/26/16 1036 09/26/16 1225  BP: 115/74   Pulse:    Resp:    Temp:    SpO2:  98%    Intake/Output Summary (Last 24 hours) at 09/26/16 1232 Last data filed at 09/26/16 0600  Gross per 24 hour  Intake  320 ml  Output              125 ml  Net              195 ml   Filed Weights   09/25/16 2211  Weight: 130.2 kg (287 lb 0.6 oz)    Exam:   General:  No distress   Cardiovascular: s1,s2  rrr  Respiratory: diminished in LL  Abdomen: soft, obese, nt  Musculoskeletal: no joint effusion    Data Reviewed: Basic Metabolic Panel:  Recent Labs Lab 09/25/16 1807 09/26/16 0451  NA 140 141  K 2.8* 3.7  CL 102 105  CO2 29 26  GLUCOSE 123* 125*  BUN 8 8  CREATININE 0.92 0.94  CALCIUM 9.0 8.8*  MG  --  2.0   Liver Function Tests:  Recent Labs Lab 09/25/16 1807  AST 20  ALT 21  ALKPHOS 74  BILITOT <0.1*  PROT 7.1  ALBUMIN 3.7   No results for input(s): LIPASE, AMYLASE in the last 168 hours. No results for input(s): AMMONIA in the last 168 hours. CBC:  Recent Labs Lab 09/25/16 1807 09/26/16 0451  WBC 12.8* 12.5*  NEUTROABS 10.5* 9.4*  HGB 11.8* 11.4*  HCT 37.1 36.0  MCV 87.3 87.0  PLT 170 192   Cardiac Enzymes:  Recent Labs Lab 09/25/16 1807  TROPONINI <0.03   BNP (last 3 results)  Recent Labs  08/07/16 0023  BNP 39.4    ProBNP (last 3 results) No results for input(s): PROBNP in the last 8760 hours.  CBG: No results for input(s): GLUCAP in the last 168 hours.  Recent Results (from the past 240 hour(s))  Culture, sputum-assessment     Status: None   Collection Time: 09/25/16  8:02 PM  Result Value Ref Range Status   Specimen Description SPUTUM  Final   Special Requests Immunocompromised  Final   Sputum evaluation THIS SPECIMEN IS ACCEPTABLE FOR SPUTUM CULTURE  Final   Report Status 09/26/2016 FINAL  Final     Studies: Dg Chest 1 View  Result Date: 09/26/2016 CLINICAL DATA:  Right thoracentesis. Shortness of breath and coughing. EXAM: CHEST 1 VIEW COMPARISON:  Ultrasound 09/26/2016.  Chest x-ray 09/25/2016. FINDINGS: PowerPort catheter noted with lead tip projected over the right atrium. Heart size normal. Right base atelectasis. Small moderate right-sided pleural effusion, significantly decreased from prior exam. No pneumothorax. IMPRESSION: No pneumothorax post thoracentesis. Removal of significant amount of right pleural effusion  . Persistent right base atelectasis and mild to moderate right pleural effusion . Electronically Signed   By: [place]   On: 09/26/2016 10:49    Scheduled Meds: . cyanocobalamin  500 mcg Oral QODAY  . enoxaparin (LOVENOX) injection  40 mg Subcutaneous Q24H  . lidocaine      . multivitamin with minerals  1 tablet Oral Daily  . sodium chloride flush  3 mL Intravenous Q12H  . sodium chloride flush  3 mL Intravenous Q12H   Continuous Infusions: . sodium chloride      Principal Problem:   Pleural effusion on right Active Problems:   DLBCL (diffuse large B cell lymphoma) (HCC)   Acute respiratory distress   Chronic low back pain   Hypokalemia   Recurrent right pleural effusion    Time spent: >35 minutes     Kinnie Feil  [facility] Pager [phone]. If 7PM-7AM, please contact night-coverage at [email or web address], password [facility] 09/26/2016, 12:32 PM  LOS: 0 days

## 2016-09-27 ENCOUNTER — Inpatient Hospital Stay (HOSPITAL_COMMUNITY): Payer: Medicare Other

## 2016-09-27 DIAGNOSIS — M5489 Other dorsalgia: Secondary | ICD-10-CM

## 2016-09-27 DIAGNOSIS — J90 Pleural effusion, not elsewhere classified: Principal | ICD-10-CM

## 2016-09-27 DIAGNOSIS — I480 Paroxysmal atrial fibrillation: Secondary | ICD-10-CM

## 2016-09-27 NOTE — Progress Notes (Signed)
PROGRESS NOTE    Kampbell Holaway  FTD:[id number] DOB: 12-05-1950 DOA: 09/25/2016 PCP: Deland Pretty, MD     Brief Narrative:  Derita Michelsen is a 66 y.o.femalewith medical history significant fornon-Hodgkin's lymphoma diagnosed recently and started on RCHOP, now presenting to the emergency department for shortness of breath. Patient was admitted to the hospital a little over a month ago with shortness of breath, was noted to have a right pleural effusion, underwent thoracentesis with resolution of her symptoms, but unfortunately was diagnosed with non-Hodgkin's lymphoma at that time. She has since begun chemotherapy and had been doing well from a respiratory perspective until the insidious development of dyspnea over the past couple weeks. She was admitted for recurrent right pleural effusion and underwent thoracentesis on 9/25.   Assessment & Plan:   Principal Problem:   Pleural effusion on right Active Problems:   DLBCL (diffuse large B cell lymphoma) (HCC)   Acute respiratory distress   Chronic low back pain   Hypokalemia   Recurrent right pleural effusion   SOB (shortness of breath)   Recurrent right pleural effusion  -Thought to be related to lymphoma possibly from decreased lymphatic drainage -S/p thoracentesis 8/6 793ml removed, 8/13 89ml removed, 9/25 1222ml removed  -Repeat CXR today showed partially loculated right pleural effusion persists with areas of consolidation in portions of the right middle and lower lobes -?Repeat thoracentesis vs consideration for pigtail catheter for pleural drainage. Will discuss with Dr. 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 Concerned about recurrent pleural effusion.  Objective: Vitals:   09/26/16 1305 09/26/16 2145 09/27/16 0449 09/27/16 0835  BP: 131/78 110/66 107/72 (!) 100/43  Pulse: 79 81 81 86  Resp:  (!) 22 20   Temp:  98.4 F (36.9 C) 98.8 F (37.1 C) 98.9 F (37.2 C)  TempSrc:  Oral Oral Oral  SpO2: 91% 97% 96% 98%  Weight:      Height:       No intake or output data in the 24 hours ending 09/27/16 1535 Filed Weights   09/25/16 2211  Weight: 130.2 kg (287 lb 0.6 oz)    Examination:  General exam: Appears calm and comfortable  Respiratory system: Diminished right lung field. Respiratory effort normal. On room air  Cardiovascular system: S1 & S2 heard, RRR. No JVD, murmurs, rubs, gallops or clicks. No pedal edema. Gastrointestinal system: Abdomen is nondistended, soft and nontender. No organomegaly or masses felt. Normal bowel sounds heard. Central nervous system: Alert and oriented. No focal neurological deficits. Extremities: Symmetric 5 x 5 power. Skin: No rashes, lesions or ulcers Psychiatry: Judgement and insight appear normal. Mood & affect appropriate.   Data Reviewed: I have personally reviewed following labs and imaging studies  CBC:  Recent Labs Lab 09/25/16 1807 09/26/16 0451  WBC 12.8* 12.5*  NEUTROABS 10.5* 9.4*  HGB 11.8* 11.4*  HCT 37.1 36.0  MCV 87.3 87.0  PLT 170 062   Basic Metabolic Panel:  Recent Labs Lab 09/25/16 1807 09/26/16  0451  NA 140 141  K 2.8* 3.7  CL 102 105  CO2 29 26  GLUCOSE 123* 125*  BUN 8 8  CREATININE 0.92 0.94  CALCIUM 9.0 8.8*  MG  --  2.0   GFR: Estimated Creatinine Clearance: 76.8 mL/min (by C-G formula based on SCr of 0.94 mg/dL). Liver Function Tests:  Recent Labs Lab 09/25/16 1807  AST 20  ALT 21  ALKPHOS 74  BILITOT  <0.1*  PROT 7.1  ALBUMIN 3.7   No results for input(s): LIPASE, AMYLASE in the last 168 hours. No results for input(s): AMMONIA in the last 168 hours. Coagulation Profile:  Recent Labs Lab 09/25/16 1810  INR 1.10   Cardiac Enzymes:  Recent Labs Lab 09/25/16 1807  TROPONINI <0.03   BNP (last 3 results) No results for input(s): PROBNP in the last 8760 hours. HbA1C: No results for input(s): HGBA1C in the last 72 hours. CBG: No results for input(s): GLUCAP in the last 168 hours. Lipid Profile: No results for input(s): CHOL, HDL, LDLCALC, TRIG, CHOLHDL, LDLDIRECT in the last 72 hours. Thyroid Function Tests: No results for input(s): TSH, T4TOTAL, FREET4, T3FREE, THYROIDAB in the last 72 hours. Anemia Panel: No results for input(s): VITAMINB12, FOLATE, FERRITIN, TIBC, IRON, RETICCTPCT in the last 72 hours. Sepsis Labs: No results for input(s): PROCALCITON, LATICACIDVEN in the last 168 hours.  Recent Results (from the past 240 hour(s))  Culture, sputum-assessment     Status: None   Collection Time: 09/25/16  8:02 PM  Result Value Ref Range Status   Specimen Description SPUTUM  Final   Special Requests Immunocompromised  Final   Sputum evaluation THIS SPECIMEN IS ACCEPTABLE FOR SPUTUM CULTURE  Final   Report Status 09/26/2016 FINAL  Final  Culture, respiratory (NON-Expectorated)     Status: None (Preliminary result)   Collection Time: 09/25/16  8:02 PM  Result Value Ref Range Status   Specimen Description SPUTUM  Final   Special Requests Immunocompromised Reflexed from [id number]  Final   Gram Stain   Final    MODERATE WBC PRESENT, PREDOMINANTLY PMN MODERATE GRAM POSITIVE COCCI MODERATE GRAM POSITIVE RODS FEW GRAM NEGATIVE RODS    Culture   Final    CULTURE REINCUBATED FOR BETTER GROWTH Performed at [facility], [address], [place], [place] [address]    Report Status PENDING  Incomplete  Body fluid culture     Status: None (Preliminary result)   Collection  Time: 09/26/16 10:22 AM  Result Value Ref Range Status   Specimen Description PLEURAL RIGHT  Final   Special Requests NONE  Final   Gram Stain   Final    FEW WBC PRESENT, PREDOMINANTLY MONONUCLEAR NO ORGANISMS SEEN    Culture   Final    NO GROWTH < 24 HOURS Performed at [facility] [address], [place], [place] [address]    Report Status PENDING  Incomplete       Radiology Studies: Dg Chest 1 View  Result Date: 09/26/2016 CLINICAL DATA:  Right thoracentesis. Shortness of breath and coughing. EXAM: CHEST 1 VIEW COMPARISON:  Ultrasound 09/26/2016.  Chest x-ray 09/25/2016. FINDINGS: PowerPort catheter noted with lead tip projected over the right atrium. Heart size normal. Right base atelectasis. Small moderate right-sided pleural effusion, significantly decreased from prior exam. No pneumothorax. IMPRESSION: No pneumothorax post thoracentesis. Removal of significant amount of right pleural effusion . Persistent right base atelectasis and mild to moderate right pleural effusion . Electronically Signed   By: Marcello Moores  Register   On: 09/26/2016 10:49   Dg Chest 2 View  Result Date: 09/27/2016 CLINICAL DATA:  Shortness of Breath EXAM: CHEST  2 VIEW COMPARISON:  September 26, 2016 FINDINGS: Port-A-Cath tip is in the superior vena cava. No pneumothorax. Sizable partially loculated pleural effusion on the right remains with consolidation in portions of the right middle and lower lobes. Lungs elsewhere are clear. Heart size and pulmonary vascularity are normal. No evident adenopathy. There is degenerative change in the thoracic spine. IMPRESSION: Port-A-Cath tip in superior vena cava.  No pneumothorax. Partially loculated right pleural effusion persists with areas of consolidation in portions of the right middle and lower lobes. No new opacity. Lungs elsewhere clear. Stable cardiac silhouette. Electronically Signed   By: Lowella Grip III M.D.   On: 09/27/2016 08:31   US Thoracentesis Asp  Pleural Space W/img Guide  Result Date: 09/26/2016 INDICATION: Patient with history of non-Hodgkin's lymphoma, dyspnea, right pleural effusion. Request made for diagnostic and therapeutic right thoracentesis. EXAM: ULTRASOUND GUIDED DIAGNOSTIC AND THERAPEUTIC RIGHT THORACENTESIS MEDICATIONS: None. COMPLICATIONS: None immediate. PROCEDURE: An ultrasound guided thoracentesis was thoroughly discussed with the patient and questions answered. The benefits, risks, alternatives and complications were also discussed. The patient understands and wishes to proceed with the procedure. Written consent was obtained. Ultrasound was performed to localize and mark an adequate pocket of fluid in the right chest. The area was then prepped and draped in the normal sterile fashion. 2% Lidocaine was used for local anesthesia. Under ultrasound guidance a Safe-T-Centesis catheter was introduced. Thoracentesis was performed. The catheter was removed and a dressing applied. FINDINGS: A total of approximately 1.2 liters of blood-tinged, chylous/milky fluid was removed. Samples were sent to the laboratory as requested by the clinical team. Only the above amount of fluid was removed today secondary to persistent patient coughing. IMPRESSION: Successful ultrasound guided diagnostic and therapeutic right thoracentesis yielding 1.2 of pleural fluid. Read by: Rowe Robert, PA-C Electronically Signed   By: Sandi Mariscal M.D.   On: 09/26/2016 13:36      Scheduled Meds: . cyanocobalamin  500 mcg Oral QODAY  . diphenhydrAMINE  25 mg Oral BID  . enoxaparin (LOVENOX) injection  40 mg Subcutaneous Q24H  . fluticasone  1 spray Each Nare Daily  . multivitamin with minerals  1 tablet Oral Daily  . sodium chloride flush  3 mL Intravenous Q12H  . sodium chloride flush  3 mL Intravenous Q12H   Continuous Infusions: . sodium chloride       LOS: 1 day    Time spent: 40 minutes   Dessa Phi, DO [facility] [email or web address] Password [email or web address] 09/27/2016, 3:35 PM

## 2016-09-28 LAB — BASIC METABOLIC PANEL
ANION GAP: 7 (ref 5–15)
BUN: 10 mg/dL (ref 6–20)
CALCIUM: 8.3 mg/dL — AB (ref 8.9–10.3)
CO2: 26 mmol/L (ref 22–32)
Chloride: 107 mmol/L (ref 101–111)
Creatinine, Ser: 0.82 mg/dL (ref 0.44–1.00)
GLUCOSE: 114 mg/dL — AB (ref 65–99)
POTASSIUM: 3.6 mmol/L (ref 3.5–5.1)
Sodium: 140 mmol/L (ref 135–145)

## 2016-09-28 LAB — CBC
HEMATOCRIT: 31.5 % — AB (ref 36.0–46.0)
Hemoglobin: 10.2 g/dL — ABNORMAL LOW (ref 12.0–15.0)
MCH: 28.1 pg (ref 26.0–34.0)
MCHC: 32.4 g/dL (ref 30.0–36.0)
MCV: 86.8 fL (ref 78.0–100.0)
PLATELETS: 160 10*3/uL (ref 150–400)
RBC: 3.63 MIL/uL — AB (ref 3.87–5.11)
RDW: 16.1 % — ABNORMAL HIGH (ref 11.5–15.5)
WBC: 9.4 10*3/uL (ref 4.0–10.5)

## 2016-09-28 NOTE — Progress Notes (Signed)
[name]  HEMATOLOGY ONCOLOGY INPATIENT PROGRESS NOTE  Date of service: .09/13/2016  Patient Care Team: Deland Pretty, MD as PCP - General (Internal Medicine)  CC: post hospitalization f/u for management of newly diagnosed diffuse large B cell lymphoma  Diagnosis: Diffuse large B cell lymphoma  Current Treatment: R-CHOP with G-CSF support  SUBJECTIVE:  Patient is here for f/u for DLBCL s/p second cycle of R CHOP on 09/13/2016. She notes no acute new toxicities.  She was admitted with gradually increasing shortness of breath and was noted to have increased rt pleural effusion and underwent thoracentesis on 9/25 which signifcant improvement in her SOB/DOE.  Also noted to have P Afib which had reverted to NSR. Currently notes breathing much improved. Notes no chest pain. Some grade 1 fatigue.   REVIEW OF SYSTEMS:    10 Point review of systems of done and is negative except as noted above.  . Past Medical History:  Diagnosis Date  . GERD (gastroesophageal reflux disease)   . NHL (non-Hodgkin's lymphoma) ([place])     . Past Surgical History:  Procedure Laterality Date  . ABDOMINAL HYSTERECTOMY    . IR FLUORO GUIDE PORT INSERTION RIGHT  08/18/2016  . IR THORACENTESIS ASP PLEURAL SPACE W/IMG GUIDE  08/07/2016  . IR THORACENTESIS ASP PLEURAL SPACE W/IMG GUIDE  08/14/2016  . IR US GUIDE VASC ACCESS RIGHT  08/18/2016    . Social History  Substance Use Topics  . Smoking status: Former Smoker    Packs/day: 0.50    Years: 47.00    Types: Cigarettes    Quit date: 07/28/2016  . Smokeless tobacco: Never Used  . Alcohol use No    ALLERGIES:  has No Known Allergies.  MEDICATIONS:  Current Facility-Administered Medications  Medication Dose Route Frequency Provider Last Rate Last Dose  . 0.9 %  sodium chloride infusion  250 mL Intravenous PRN Opyd, Ilene Qua, MD      . acetaminophen (TYLENOL) tablet 650 mg  650 mg Oral Q6H PRN Opyd, Ilene Qua, MD   650 mg at 09/26/16 1426   Or  .  acetaminophen (TYLENOL) suppository 650 mg  650 mg Rectal Q6H PRN Opyd, Ilene Qua, MD      . cyanocobalamin tablet 500 mcg  500 mcg Oral QODAY Vianne Bulls, MD   500 mcg at 09/26/16 0910  . diphenhydrAMINE (BENADRYL) capsule 25 mg  25 mg Oral BID Schorr, Rhetta Mura, NP   25 mg at 09/27/16 2137  . enoxaparin (LOVENOX) injection 40 mg  40 mg Subcutaneous Q24H Opyd, Ilene Qua, MD   40 mg at 09/27/16 2137  . fluticasone (FLONASE) 50 MCG/ACT nasal spray 1 spray  1 spray Each Nare Daily Kinnie Feil, MD   1 spray at 09/27/16 1050  . hydrALAZINE (APRESOLINE) injection 10 mg  10 mg Intravenous Q4H PRN Opyd, Ilene Qua, MD      . HYDROcodone-acetaminophen (NORCO/VICODIN) 5-325 MG per tablet 1-2 tablet  1-2 tablet Oral Q4H PRN Opyd, Ilene Qua, MD      . morphine 4 MG/ML injection 2 mg  2 mg Intravenous Q3H PRN Opyd, Ilene Qua, MD      . multivitamin with minerals tablet 1 tablet  1 tablet Oral Daily Opyd, Ilene Qua, MD   1 tablet at 09/27/16 1042  . ondansetron (ZOFRAN) tablet 4 mg  4 mg Oral Q6H PRN Opyd, Ilene Qua, MD       Or  . ondansetron (ZOFRAN) injection 4 mg  4 mg Intravenous  Q6H PRN Opyd, Ilene Qua, MD      . polyethylene glycol (MIRALAX / GLYCOLAX) packet 17 g  17 g Oral Daily PRN Opyd, Ilene Qua, MD      . sodium chloride flush (NS) 0.9 % injection 3 mL  3 mL Intravenous Q12H Opyd, Ilene Qua, MD   3 mL at 09/27/16 2140  . sodium chloride flush (NS) 0.9 % injection 3 mL  3 mL Intravenous Q12H Opyd, Ilene Qua, MD   3 mL at 09/27/16 2140  . sodium chloride flush (NS) 0.9 % injection 3 mL  3 mL Intravenous PRN Opyd, Ilene Qua, MD        PHYSICAL EXAMINATION: ECOG PERFORMANCE STATUS: 1 - Symptomatic but completely ambulatory  . Vitals:   09/27/16 2049 09/28/16 0445  BP: 123/69 (!) 105/56  Pulse: 76 74  Resp:  20  Temp: 99.4 F (37.4 C) 98.4 F (36.9 C)  SpO2: 98% 96%    Filed Weights   09/25/16 2211  Weight: 287 lb 0.6 oz (130.2 kg)   .Body mass index is 53.36  kg/m.  GENERAL:alert, in no acute distress and comfortable SKIN: no acute rashes, no significant lesions EYES: conjunctiva are pink and non-injected, sclera anicteric OROPHARYNX: MMM, no exudates, no oropharyngeal erythema or ulceration NECK: supple, no JVD LYMPH:  no palpable lymphadenopathy in the cervical, axillary or inguinal regions LUNGS: distant breath sounds. Decreased AE rt baseline about 1/4 up posterior chest wall. HEART: regular rate & rhythm ABDOMEN:  normoactive bowel sounds , non tender, not distended. Extremity: trace pedal edema PSYCH: alert & oriented x 3 with fluent speech NEURO: no focal motor/sensory deficits  LABORATORY DATA:   I have reviewed the data as listed  . CBC Latest Ref Rng & Units 09/28/2016 09/26/2016 09/25/2016  WBC 4.0 - 10.5 K/uL 9.4 12.5(H) 12.8(H)  Hemoglobin 12.0 - 15.0 g/dL 10.2(L) 11.4(L) 11.8(L)  Hematocrit 36.0 - 46.0 % 31.5(L) 36.0 37.1  Platelets 150 - 400 K/uL 160 192 170    . CMP Latest Ref Rng & Units 09/28/2016 09/26/2016 09/25/2016  Glucose 65 - 99 mg/dL 114(H) 125(H) 123(H)  BUN 6 - 20 mg/dL 10 8 8   Creatinine 0.44 - 1.00 mg/dL 0.82 0.94 0.92  Sodium 135 - 145 mmol/L 140 141 140  Potassium 3.5 - 5.1 mmol/L 3.6 3.7 2.8(L)  Chloride 101 - 111 mmol/L 107 105 102  CO2 22 - 32 mmol/L 26 26 29   Calcium 8.9 - 10.3 mg/dL 8.3(L) 8.8(L) 9.0  Total Protein 6.5 - 8.1 g/dL - - 7.1  Total Bilirubin 0.3 - 1.2 mg/dL - - <0.1(L)  Alkaline Phos 38 - 126 U/L - - 74  AST 15 - 41 U/L - - 20  ALT 14 - 54 U/L - - 21   . Lab Results  Component Value Date   LDH 270 (H) 09/13/2016     RADIOGRAPHIC STUDIES: I have personally reviewed the radiological images as listed and agreed with the findings in the report. Dg Chest 1 View  Result Date: 09/26/2016 CLINICAL DATA:  Right thoracentesis. Shortness of breath and coughing. EXAM: CHEST 1 VIEW COMPARISON:  Ultrasound 09/26/2016.  Chest x-ray 09/25/2016. FINDINGS: PowerPort catheter noted with lead  tip projected over the right atrium. Heart size normal. Right base atelectasis. Small moderate right-sided pleural effusion, significantly decreased from prior exam. No pneumothorax. IMPRESSION: No pneumothorax post thoracentesis. Removal of significant amount of right pleural effusion . Persistent right base atelectasis and mild to moderate right pleural effusion .  Electronically Signed   By: Marcello Moores  Register   On: 09/26/2016 10:49   Dg Chest 2 View  Result Date: 09/27/2016 CLINICAL DATA:  Shortness of Breath EXAM: CHEST  2 VIEW COMPARISON:  September 26, 2016 FINDINGS: Port-A-Cath tip is in the superior vena cava. No pneumothorax. Sizable partially loculated pleural effusion on the right remains with consolidation in portions of the right middle and lower lobes. Lungs elsewhere are clear. Heart size and pulmonary vascularity are normal. No evident adenopathy. There is degenerative change in the thoracic spine. IMPRESSION: Port-A-Cath tip in superior vena cava.  No pneumothorax. Partially loculated right pleural effusion persists with areas of consolidation in portions of the right middle and lower lobes. No new opacity. Lungs elsewhere clear. Stable cardiac silhouette. Electronically Signed   By: Lowella Grip III M.D.   On: 09/27/2016 08:31   US Thoracentesis Asp Pleural Space W/img Guide  Result Date: 09/26/2016 INDICATION: Patient with history of non-Hodgkin's lymphoma, dyspnea, right pleural effusion. Request made for diagnostic and therapeutic right thoracentesis. EXAM: ULTRASOUND GUIDED DIAGNOSTIC AND THERAPEUTIC RIGHT THORACENTESIS MEDICATIONS: None. COMPLICATIONS: None immediate. PROCEDURE: An ultrasound guided thoracentesis was thoroughly discussed with the patient and questions answered. The benefits, risks, alternatives and complications were also discussed. The patient understands and wishes to proceed with the procedure. Written consent was obtained. Ultrasound was performed to localize  and mark an adequate pocket of fluid in the right chest. The area was then prepped and draped in the normal sterile fashion. 2% Lidocaine was used for local anesthesia. Under ultrasound guidance a Safe-T-Centesis catheter was introduced. Thoracentesis was performed. The catheter was removed and a dressing applied. FINDINGS: A total of approximately 1.2 liters of blood-tinged, chylous/milky fluid was removed. Samples were sent to the laboratory as requested by the clinical team. Only the above amount of fluid was removed today secondary to persistent patient coughing. IMPRESSION: Successful ultrasound guided diagnostic and therapeutic right thoracentesis yielding 1.2 of pleural fluid. Read by: Rowe Robert, PA-C Electronically Signed   By: Sandi Mariscal M.D.   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  #2 Recurrent right pleural effusion likely related to lymphoma possibly from decreased lymphatic drainage. Symptoms nearly resolved s/p thoracentesis with removal of 1.2L of pleural fluid. Additional element of pleural effusion from ? Diastolic dysfunction +steroids with fluid retention+ atelectasis from body habitus +r/o parapneumonic process.  #3 Back pain with large paraspinal mass in the thoracic region with possible epidural extension. No overt neurological symptoms related to this.  No significant back pain related to this.  Plan  -appreciate excellent care from hospitalist team -would avoid indwelling pleurex catheter at this time due to risk of infection with ongoing chemotherapy -incentive spirometry -optimize fluid status and mx of p afib -might need additional drainage of residual pleural fluid if still  symptomatic or if evidence of infection. -pending cultures to r/o pneumonia -pending flow cytometry on pleural fluid to evaluate for clonal lymphocyte population. -continue chemotherapy as per schedule.   #4 . Patient Active Problem List   Diagnosis Date Noted  . SOB (shortness of breath) 09/26/2016  . Acute respiratory distress 09/25/2016  . Chronic low back pain 09/25/2016  . Hypokalemia 09/25/2016  . Recurrent right pleural effusion  09/25/2016  . DLBCL (diffuse large B cell lymphoma) ([place]) 08/17/2016  . Acute midline thoracic back pain   . Lymphadenopathy   . Morbid obesity with BMI of 50.0-59.9, adult ([place])   . NHL (non-Hodgkin's lymphoma) ([place])   . Abdominal pain, RUQ   . Pleural effusion on right 08/07/2016  . Abnormal EKG 08/07/2016  . 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 Anemia 08/07/2016  . GERD (gastroesophageal reflux disease) 08/07/2016  . Shortness of breath 08/07/2016   -continue f/u with PCP for other medical cares and for continue mx of Paroxysmal afib. -would recommend sleep study to evaluate for sleep apnea.   I spent 25 minutes counseling the patient face to face. The total time spent in the appointment was 35 minutes and more than 50% was on counseling and direct patient cares.    Sullivan Lone MD [place] AAHIVMS [facility] Hematology/Oncology Physician [facility]  (Office):       [phone] (Work cell):  [phone] (Fax):           [phone]

## 2016-09-28 NOTE — Discharge Summary (Signed)
Physician Discharge Summary  Jo Collins OEU:[id number] DOB: 08-20-1950 DOA: 09/25/2016  PCP: Deland Pretty, MD  Admit date: 09/25/2016 Discharge date: 09/28/2016  Admitted From: Home Disposition:  Home  Recommendations for Outpatient Follow-up:  1. Follow up with PCP in 1 week 2. Repeat CXR in 1 week  3. Follow up with Dr. Irene Limbo as scheduled 4. Please follow up on the following pending results: pleural fluid culture result  5. Recommend outpatient sleep study 6. Recommend further discussion for anticoagulation to decrease risk of stroke with paroxysmal A fib. For now patient wants to keep off anticoagulation until she can get through chemotherapy.   Discharge Condition: Stable CODE STATUS: Full  Diet recommendation: Heart healthy   Brief/Interim Summary: Jo Collins is a 66 y.o.femalewith medical history significant fornon-Hodgkin's lymphoma diagnosed recently and started on RCHOP, now presenting to the emergency department for shortness of breath. Patient was admitted to the hospital a little over a month ago with shortness of breath, was noted to have a right pleural effusion, underwent thoracentesis with resolution of her symptoms, but unfortunately was diagnosed with non-Hodgkin's lymphoma at that time. She has since begun chemotherapy and had been doing well from a respiratory perspective until the insidious development of dyspnea over the past couple weeks. She was admitted for recurrent right pleural effusion and underwent thoracentesis on 9/25.   Subjective on day of discharge: Today patient is on room air and saturating well. No conversational dyspnea, no exertional dyspnea. Has been walking the hallway on room air without issue. Eating breakfast. Denies SOB, CP, peripheral edema. Some nocturnal dry cough.   Discharge Diagnoses:  Principal Problem:   Pleural effusion on right Active Problems:   DLBCL (diffuse large B cell lymphoma) (HCC)   Acute respiratory distress    Chronic low back pain   Hypokalemia   Recurrent right pleural effusion   SOB (shortness of breath)   Recurrent right pleural effusion  -Thought to be related to lymphoma possibly from decreased lymphatic drainage -S/p thoracentesis 8/6 779ml removed, 8/13 837ml removed, 9/25 1277ml removed  -Repeat CXR showed partially loculated right pleural effusion persists with areas of consolidation in portions of the right middle and lower lobes -Today patient is on room air and saturating well. No conversational dyspnea, no exertional dyspnea. Has been walking the hallway on room air without issue. Will hold off on further thoracentesis for now, as patient is symptomatically improved after thoracentesis on 9/25 and also with marginal BP 105/56 this morning. Fluid with predominant lymphocytes. May require future eval with CXR with repeat thoracentesis if grossly symptomatic or with worsening pleural effusion.   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 Follow up with Dr. Irene Limbo.   Paroxysmal A fib -CHADSVasc 2 -Patient considering anticoagulation, risk vs benefit discussed with husband and patient at length. Patient states she wants to get through chemo first before starting any anticoagulation.  -NSR this morning    Discharge Instructions  Discharge Instructions    Call MD for:  difficulty breathing, headache or visual disturbances    Complete by:  As directed    Call MD for:  extreme fatigue    Complete by:  As directed    Call MD for:  hives    Complete by:  As directed    Call MD for:  persistant dizziness or light-headedness    Complete by:  As directed    Call MD for:  persistant nausea and vomiting    Complete by:  As  directed    Call MD for:  severe uncontrolled pain    Complete by:  As directed    Call MD for:  temperature >100.4    Complete by:  As directed    Diet - low sodium heart healthy    Complete by:  As directed    Increase activity slowly     Complete by:  As directed      Allergies as of 09/28/2016   No Known Allergies     Medication List    TAKE these medications   acetaminophen 500 MG tablet Commonly known as:  TYLENOL Take 1,000 mg by mouth every 6 (six) hours as needed for moderate pain.   cyanocobalamin 500 MCG tablet Take 1 tablet (500 mcg total) by mouth daily. What changed:  when to take this   diphenhydrAMINE 25 MG tablet Commonly known as:  BENADRYL Take 25 mg by mouth daily as needed for allergies.   lidocaine-prilocaine cream Commonly known as:  EMLA Apply to affected area once   lidocaine-prilocaine cream Commonly known as:  EMLA Apply 1 application topically as needed. Apply to port-a-cath assess site 45-60 mins prior to chemotherapy.   loratadine 10 MG tablet Commonly known as:  CLARITIN Take 10 mg by mouth daily as needed for allergies.   multivitamin with minerals Tabs tablet Take 1 tablet by mouth daily.   polyethylene glycol packet Commonly known as:  MIRALAX / GLYCOLAX Take 17 g by mouth daily. What changed:  when to take this  reasons to take this   predniSONE 20 MG tablet Commonly known as:  DELTASONE Take 3 tablets (60 mg total) by mouth daily. Take on days 1-5 of chemotherapy.   prochlorperazine 10 MG tablet Commonly known as:  COMPAZINE Take 1 tablet (10 mg total) by mouth every 6 (six) hours as needed (Nausea or vomiting).            Discharge Care Instructions        Start     Ordered   09/28/16 0000  Increase activity slowly     09/28/16 0922   09/28/16 0000  Diet - low sodium heart healthy     09/28/16 0922   09/28/16 0000  Call MD for:  extreme fatigue     09/28/16 0922   09/28/16 0000  Call MD for:  persistant dizziness or light-headedness     09/28/16 0922   09/28/16 0000  Call MD for:  hives     09/28/16 0922   09/28/16 0000  Call MD for:  difficulty breathing, headache or visual disturbances     09/28/16 0922   09/28/16 0000  Call MD for:   severe uncontrolled pain     09/28/16 0922   09/28/16 0000  Call MD for:  persistant nausea and vomiting     09/28/16 6962   09/28/16 0000  Call MD for:  temperature >100.4     09/28/16 9528     Follow-up Information    Deland Pretty, MD. Schedule an appointment as soon as possible for a visit in 1 week(s).   Specialty:  Internal Medicine Contact information: [address] [place] [address] [phone]        Brunetta Genera, MD. Go to.   Specialties:  Hematology, Oncology Contact information: [place] [address] [phone]          No Known Allergies  Consultations:  IR  Oncology    Procedures/Studies: Dg Chest  1 View  Result Date: 09/26/2016 CLINICAL DATA:  Right thoracentesis. Shortness of breath and coughing. EXAM: CHEST 1 VIEW COMPARISON:  Ultrasound 09/26/2016.  Chest x-ray 09/25/2016. FINDINGS: PowerPort catheter noted with lead tip projected over the right atrium. Heart size normal. Right base atelectasis. Small moderate right-sided pleural effusion, significantly decreased from prior exam. No pneumothorax. IMPRESSION: No pneumothorax post thoracentesis. Removal of significant amount of right pleural effusion . Persistent right base atelectasis and mild to moderate right pleural effusion . Electronically Signed   By: Marcello Moores  Register   On: 09/26/2016 10:49   Dg Chest 2 View  Result Date: 09/27/2016 CLINICAL DATA:  Shortness of Breath EXAM: CHEST  2 VIEW COMPARISON:  September 26, 2016 FINDINGS: Port-A-Cath tip is in the superior vena cava. No pneumothorax. Sizable partially loculated pleural effusion on the right remains with consolidation in portions of the right middle and lower lobes. Lungs elsewhere are clear. Heart size and pulmonary vascularity are normal. No evident adenopathy. There is degenerative change in the thoracic spine. IMPRESSION: Port-A-Cath tip in superior vena cava.  No pneumothorax.  Partially loculated right pleural effusion persists with areas of consolidation in portions of the right middle and lower lobes. No new opacity. Lungs elsewhere clear. Stable cardiac silhouette. Electronically Signed   By: Lowella Grip III M.D.   On: 09/27/2016 08:31   US Thoracentesis Asp Pleural Space W/img Guide  Result Date: 09/26/2016 INDICATION: Patient with history of non-Hodgkin's lymphoma, dyspnea, right pleural effusion. Request made for diagnostic and therapeutic right thoracentesis. EXAM: ULTRASOUND GUIDED DIAGNOSTIC AND THERAPEUTIC RIGHT THORACENTESIS MEDICATIONS: None. COMPLICATIONS: None immediate. PROCEDURE: An ultrasound guided thoracentesis was thoroughly discussed with the patient and questions answered. The benefits, risks, alternatives and complications were also discussed. The patient understands and wishes to proceed with the procedure. Written consent was obtained. Ultrasound was performed to localize and mark an adequate pocket of fluid in the right chest. The area was then prepped and draped in the normal sterile fashion. 2% Lidocaine was used for local anesthesia. Under ultrasound guidance a Safe-T-Centesis catheter was introduced. Thoracentesis was performed. The catheter was removed and a dressing applied. FINDINGS: A total of approximately 1.2 liters of blood-tinged, chylous/milky fluid was removed. Samples were sent to the laboratory as requested by the clinical team. Only the above amount of fluid was removed today secondary to persistent patient coughing. IMPRESSION: Successful ultrasound guided diagnostic and therapeutic right thoracentesis yielding 1.2 of pleural fluid. Read by: Rowe Robert, PA-C Electronically Signed   By: Sandi Mariscal M.D.   On: 09/26/2016 13:36        Discharge Exam: Vitals:   09/27/16 2049 09/28/16 0445  BP: 123/69 (!) 105/56  Pulse: 76 74  Resp:  20  Temp: 99.4 F (37.4 C) 98.4 F (36.9 C)  SpO2: 98% 96%   Vitals:   09/27/16 0449  09/27/16 0835 09/27/16 2049 09/28/16 0445  BP: 107/72 (!) 100/43 123/69 (!) 105/56  Pulse: 81 86 76 74  Resp: 20   20  Temp: 98.8 F (37.1 C) 98.9 F (37.2 C) 99.4 F (37.4 C) 98.4 F (36.9 C)  TempSrc: Oral Oral Oral Oral  SpO2: 96% 98% 98% 96%  Weight:      Height:        General: Pt is alert, awake, not in acute distress Cardiovascular: RRR, S1/S2 +, no rubs, no gallops Respiratory: Diminished breath sound on right, improved, no wheezing, no rhonchi Abdominal: Soft, NT, ND, bowel sounds + Extremities: Trace pedal edema, no  cyanosis    The results of significant diagnostics from this hospitalization (including imaging, microbiology, ancillary and laboratory) are listed below for reference.     Microbiology: Recent Results (from the past 240 hour(s))  Culture, sputum-assessment     Status: None   Collection Time: 09/25/16  8:02 PM  Result Value Ref Range Status   Specimen Description SPUTUM  Final   Special Requests Immunocompromised  Final   Sputum evaluation THIS SPECIMEN IS ACCEPTABLE FOR SPUTUM CULTURE  Final   Report Status 09/26/2016 FINAL  Final  Culture, respiratory (NON-Expectorated)     Status: None (Preliminary result)   Collection Time: 09/25/16  8:02 PM  Result Value Ref Range Status   Specimen Description SPUTUM  Final   Special Requests Immunocompromised Reflexed from [id number]  Final   Gram Stain   Final    MODERATE WBC PRESENT, PREDOMINANTLY PMN MODERATE GRAM POSITIVE COCCI MODERATE GRAM POSITIVE RODS FEW GRAM NEGATIVE RODS    Culture   Final    CULTURE REINCUBATED FOR BETTER GROWTH Performed at [facility], [address], [place], [place] [address]    Report Status PENDING  Incomplete  Body fluid culture     Status: None (Preliminary result)   Collection Time: 09/26/16 10:22 AM  Result Value Ref Range Status   Specimen Description PLEURAL RIGHT  Final   Special Requests NONE  Final   Gram Stain   Final    FEW WBC PRESENT, PREDOMINANTLY  MONONUCLEAR NO ORGANISMS SEEN    Culture   Final    NO GROWTH < 24 HOURS Performed at [facility] [address] [place], [place] [address]    Report Status PENDING  Incomplete     Labs: BNP (last 3 results)  Recent Labs  08/07/16 0023  BNP 57.3   Basic Metabolic Panel:  Recent Labs Lab 09/25/16 1807 09/26/16 0451 09/28/16 0505  NA 140 141 140  K 2.8* 3.7 3.6  CL 102 105 107  CO2 29 26 26   GLUCOSE 123* 125* 114*  BUN 8 8 10   CREATININE 0.92 0.94 0.82  CALCIUM 9.0 8.8* 8.3*  MG  --  2.0  --    Liver Function Tests:  Recent Labs Lab 09/25/16 1807  AST 20  ALT 21  ALKPHOS 74  BILITOT <0.1*  PROT 7.1  ALBUMIN 3.7   No results for input(s): LIPASE, AMYLASE in the last 168 hours. No results for input(s): AMMONIA in the last 168 hours. CBC:  Recent Labs Lab 09/25/16 1807 09/26/16 0451 09/28/16 0505  WBC 12.8* 12.5* 9.4  NEUTROABS 10.5* 9.4*  --   HGB 11.8* 11.4* 10.2*  HCT 37.1 36.0 31.5*  MCV 87.3 87.0 86.8  PLT 170 192 160   Cardiac Enzymes:  Recent Labs Lab 09/25/16 1807  TROPONINI <0.03   BNP: Invalid input(s): POCBNP CBG: No results for input(s): GLUCAP in the last 168 hours. D-Dimer No results for input(s): DDIMER in the last 72 hours. Hgb A1c No results for input(s): HGBA1C in the last 72 hours. Lipid Profile No results for input(s): CHOL, HDL, LDLCALC, TRIG, CHOLHDL, LDLDIRECT in the last 72 hours. Thyroid function studies No results for input(s): TSH, T4TOTAL, T3FREE, THYROIDAB in the last 72 hours.  Invalid input(s): FREET3 Anemia work up No results for input(s): VITAMINB12, FOLATE, FERRITIN, TIBC, IRON, RETICCTPCT in the last 72 hours. Urinalysis    Component Value Date/Time   COLORURINE AMBER (A) 09/25/2016 1807   APPEARANCEUR CLOUDY (A) 09/25/2016 1807  LABSPEC 1.026 09/25/2016 1807   PHURINE 5.0 09/25/2016 1807   GLUCOSEU NEGATIVE 09/25/2016 1807   HGBUR NEGATIVE 09/25/2016 1807   BILIRUBINUR NEGATIVE  09/25/2016 [place] 09/25/2016 1807   PROTEINUR 30 (A) 09/25/2016 1807   NITRITE NEGATIVE 09/25/2016 1807   LEUKOCYTESUR NEGATIVE 09/25/2016 1807   Sepsis Labs Invalid input(s): PROCALCITONIN,  WBC,  LACTICIDVEN Microbiology Recent Results (from the past 240 hour(s))  Culture, sputum-assessment     Status: None   Collection Time: 09/25/16  8:02 PM  Result Value Ref Range Status   Specimen Description SPUTUM  Final   Special Requests Immunocompromised  Final   Sputum evaluation THIS SPECIMEN IS ACCEPTABLE FOR SPUTUM CULTURE  Final   Report Status 09/26/2016 FINAL  Final  Culture, respiratory (NON-Expectorated)     Status: None (Preliminary result)   Collection Time: 09/25/16  8:02 PM  Result Value Ref Range Status   Specimen Description SPUTUM  Final   Special Requests Immunocompromised Reflexed from [id number]  Final   Gram Stain   Final    MODERATE WBC PRESENT, PREDOMINANTLY PMN MODERATE GRAM POSITIVE COCCI MODERATE GRAM POSITIVE RODS FEW GRAM NEGATIVE RODS    Culture   Final    CULTURE REINCUBATED FOR BETTER GROWTH Performed at [facility], [place] [address], [place], [place] [address]    Report Status PENDING  Incomplete  Body fluid culture     Status: None (Preliminary result)   Collection Time: 09/26/16 10:22 AM  Result Value Ref Range Status   Specimen Description PLEURAL RIGHT  Final   Special Requests NONE  Final   Gram Stain   Final    FEW WBC PRESENT, PREDOMINANTLY MONONUCLEAR NO ORGANISMS SEEN    Culture   Final    NO GROWTH < 24 HOURS Performed at [facility] [address] [place], [place] [address]    Report Status PENDING  Incomplete     Time coordinating discharge: 40 minutes  SIGNED:  Dessa Phi, DO [facility] Pager [phone]  If 7PM-7AM, please contact night-coverage [email or web address] Password [email or web address] 09/28/2016, 9:22 AM

## 2016-09-28 NOTE — Progress Notes (Signed)
A call to pt's wife Jo Collins [phone] was made to inform her that her husband left within 24 hours, that medicare will not pay for IP but Observation level with pt paying 20% and Medicare paying 80%. Darlene states okay, I did not sign anything. Very pleasant. She continued to say I understand.

## 2016-09-29 LAB — CULTURE, RESPIRATORY W GRAM STAIN: Culture: NORMAL

## 2016-09-29 LAB — BODY FLUID CULTURE: CULTURE: NO GROWTH

## 2016-09-29 LAB — CULTURE, RESPIRATORY

## 2016-10-02 ENCOUNTER — Ambulatory Visit: Payer: Medicare Other | Attending: Hematology | Admitting: Physical Therapy

## 2016-10-03 NOTE — Progress Notes (Signed)
[name]  HEMATOLOGY ONCOLOGY INPATIENT PROGRESS NOTE  Date of service: 10/04/16  Patient Care Team: Deland Pretty, MD as PCP - General (Internal Medicine)  CC: post hospitalization f/u for management of newly diagnosed diffuse large B cell lymphoma  Diagnosis: Diffuse large B cell lymphoma  Current Treatment: R-CHOP with G-CSF support s/p R-CHOP x 2 cycles.  SUBJECTIVE:  Patient is here for f/u for DLBCL prior to her 3rd cycle of RCHOP s/p second cycle of R CHOP on 09/13/2016. She notes no acute new toxicities. She is accompanied by her husband. she reports that she is doing well overall. She reports sinus issues x 2-3 weeks including a productive cough. She states that the cough is worsened with elevated elevated physical activity. Pt is a former smoker but has quit since the start of her chemotherapy. Pt reports she is currently not on blood thinners. She reports she was evaluated by cardiologist in 2011/2012 and was given clean bill of health with diagnosis of acid reflux. She was given a referral to f/u with cardiology again for evaluation and mx of atrial fibrillation.  Pt reports intermittent insomnia and plans to undergo a sleep study after her chemotherapy. Pt reports that she is taking Claritin to relieve sinus symptoms. Pt is scheduled for chemotherapy infusion today and lumbar puncture on 10/05/2016.    On review of systems, pt reports minimal swelling to the ankles, chronic productive cough x clear sputum, acid reflux, ear pressure, congestion, soreness in the ear, intermittent insomnia, and denies pain in the calf, abdominal pain, bowel problems, and any other accompanying symptoms.   REVIEW OF SYSTEMS:    10 Point review of systems of done and is negative except as noted above.  . Past Medical History:  Diagnosis Date  . GERD (gastroesophageal reflux disease)   . NHL (non-Hodgkin's lymphoma) [facility])     Past Surgical History:  Procedure Laterality Date  . ABDOMINAL  HYSTERECTOMY    . IR FLUORO GUIDE PORT INSERTION RIGHT  08/18/2016  . IR THORACENTESIS ASP PLEURAL SPACE W/IMG GUIDE  08/07/2016  . IR THORACENTESIS ASP PLEURAL SPACE W/IMG GUIDE  08/14/2016  . IR US GUIDE VASC ACCESS RIGHT  08/18/2016    . Social History  Substance Use Topics  . Smoking status: Former Smoker    Packs/day: 0.50    Years: 47.00    Types: Cigarettes    Quit date: 07/28/2016  . Smokeless tobacco: Never Used  . Alcohol use No    ALLERGIES:  has No Known Allergies.  MEDICATIONS:  Current Outpatient Prescriptions  Medication Sig Dispense Refill  . acetaminophen (TYLENOL) 500 MG tablet Take 1,000 mg by mouth every 6 (six) hours as needed for moderate pain.    . diphenhydrAMINE (BENADRYL) 25 MG tablet Take 25-50 mg by mouth daily as needed for allergies (chemotherapy infusion).     [name] lidocaine-prilocaine (EMLA) cream Apply to affected area once 30 g 3  . lidocaine-prilocaine (EMLA) cream Apply 1 application topically as needed. Apply to port-a-cath assess site 45-60 mins prior to chemotherapy. 30 g 6  . loratadine (CLARITIN) 10 MG tablet Take 10 mg by mouth daily as needed for allergies.    . Multiple Vitamin (MULTIVITAMIN WITH MINERALS) TABS tablet Take 1 tablet by mouth daily.    . polyethylene glycol (MIRALAX / GLYCOLAX) packet Take 17 g by mouth daily. 14 each 0  . predniSONE (DELTASONE) 20 MG tablet Take 3 tablets (60 mg total) by mouth daily. Take on days 1-5 of chemotherapy.  20 tablet 5  . prochlorperazine (COMPAZINE) 10 MG tablet Take 1 tablet (10 mg total) by mouth every 6 (six) hours as needed (Nausea or vomiting). 30 tablet 6  . vitamin B-12 500 MCG tablet Take 1 tablet (500 mcg total) by mouth daily. (Patient taking differently: Take 500 mcg by mouth every other day. ) 30 tablet 0  . benzonatate (TESSALON) 100 MG capsule Take 1 capsule (100 mg total) by mouth 3 (three) times daily as needed for cough. 20 capsule 0  . traZODone (DESYREL) 50 MG tablet Take 1 tablet (50  mg total) by mouth at bedtime as needed for sleep. 30 tablet 0   No current facility-administered medications for this visit.     PHYSICAL EXAMINATION: ECOG PERFORMANCE STATUS: 1 - Symptomatic but completely ambulatory  . Vitals:   10/04/16 0829  BP: 136/80  Pulse: 67  Resp: 20  Temp: 98.3 F (36.8 C)  SpO2: 100%    Filed Weights   10/04/16 0829  Weight: 285 lb 14.4 oz (129.7 kg)   .Body mass index is 53.15 kg/m.  GENERAL:alert, in no acute distress and comfortable SKIN: no acute rashes, no significant lesions EYES: conjunctiva are pink and non-injected, sclera anicteric OROPHARYNX: MMM, no exudates, no oropharyngeal erythema or ulceration NECK: supple, no JVD LYMPH:  no palpable lymphadenopathy in the cervical, axillary or inguinal regions LUNGS: distant breath sounds. Decreased AE rt baseline about 1/4 up posterior chest wall. HEART: S1/S2 irregular rate & rhythm ABDOMEN:  normoactive bowel sounds , non tender, not distended. Extremity: grade 1 pedal edema PSYCH: alert & oriented x 3 with fluent speech NEURO: no focal motor/sensory deficits   LABORATORY DATA:   I have reviewed the data as listed  . CBC Latest Ref Rng & Units 10/04/2016 09/28/2016 09/26/2016  WBC 3.9 - 10.3 10e3/uL 7.9 9.4 12.5(H)  Hemoglobin 11.6 - 15.9 g/dL 11.3(L) 10.2(L) 11.4(L)  Hematocrit 34.8 - 46.6 % 36.0 31.5(L) 36.0  Platelets 145 - 400 10e3/uL 327 160 192    . CMP Latest Ref Rng & Units 10/04/2016 09/28/2016 09/26/2016  Glucose 70 - 140 mg/dl 103 114(H) 125(H)  BUN 7.0 - 26.0 mg/dL 13.0 10 8  Creatinine 0.6 - 1.1 mg/dL 0.9 0.82 0.94  Sodium 136 - 145 mEq/L 142 140 141  Potassium 3.5 - 5.1 mEq/L 4.1 3.6 3.7  Chloride 101 - 111 mmol/L - 107 105  CO2 22 - 29 mEq/L 24 26 26   Calcium 8.4 - 10.4 mg/dL 9.3 8.3(L) 8.8(L)  Total Protein 6.4 - 8.3 g/dL 6.6 - -  Total Bilirubin 0.20 - 1.20 mg/dL 0.31 - -  Alkaline Phos 40 - 150 U/L 51 - -  AST 5 - 34 U/L 21 - -  ALT 0 - 55 U/L 20 - -    . Lab Results  Component Value Date   LDH 223 10/04/2016     RADIOGRAPHIC STUDIES: I have personally reviewed the radiological images as listed and agreed with the findings in the report. Dg Chest 1 View  Result Date: 09/26/2016 CLINICAL DATA:  Right thoracentesis. Shortness of breath and coughing. EXAM: CHEST 1 VIEW COMPARISON:  Ultrasound 09/26/2016.  Chest x-ray 09/25/2016. FINDINGS: PowerPort catheter noted with lead tip projected over the right atrium. Heart size normal. Right base atelectasis. Small moderate right-sided pleural effusion, significantly decreased from prior exam. No pneumothorax. IMPRESSION: No pneumothorax post thoracentesis. Removal of significant amount of right pleural effusion . Persistent right base atelectasis and mild to moderate right pleural effusion .  Electronically Signed   By: Marcello Moores  Register   On: 09/26/2016 10:49   Dg Chest 2 View  Result Date: 09/27/2016 CLINICAL DATA:  Shortness of Breath EXAM: CHEST  2 VIEW COMPARISON:  September 26, 2016 FINDINGS: Port-A-Cath tip is in the superior vena cava. No pneumothorax. Sizable partially loculated pleural effusion on the right remains with consolidation in portions of the right middle and lower lobes. Lungs elsewhere are clear. Heart size and pulmonary vascularity are normal. No evident adenopathy. There is degenerative change in the thoracic spine. IMPRESSION: Port-A-Cath tip in superior vena cava.  No pneumothorax. Partially loculated right pleural effusion persists with areas of consolidation in portions of the right middle and lower lobes. No new opacity. Lungs elsewhere clear. Stable cardiac silhouette. Electronically Signed   By: Lowella Grip III M.D.   On: 09/27/2016 08:31   US Thoracentesis Asp Pleural Space W/img Guide  Result Date: 09/26/2016 INDICATION: Patient with history of non-Hodgkin's lymphoma, dyspnea, right pleural effusion. Request made for diagnostic and therapeutic right thoracentesis.  EXAM: ULTRASOUND GUIDED DIAGNOSTIC AND THERAPEUTIC RIGHT THORACENTESIS MEDICATIONS: None. COMPLICATIONS: None immediate. PROCEDURE: An ultrasound guided thoracentesis was thoroughly discussed with the patient and questions answered. The benefits, risks, alternatives and complications were also discussed. The patient understands and wishes to proceed with the procedure. Written consent was obtained. Ultrasound was performed to localize and mark an adequate pocket of fluid in the right chest. The area was then prepped and draped in the normal sterile fashion. 2% Lidocaine was used for local anesthesia. Under ultrasound guidance a Safe-T-Centesis catheter was introduced. Thoracentesis was performed. The catheter was removed and a dressing applied. FINDINGS: A total of approximately 1.2 liters of blood-tinged, chylous/milky fluid was removed. Samples were sent to the laboratory as requested by the clinical team. Only the above amount of fluid was removed today secondary to persistent patient coughing. IMPRESSION: Successful ultrasound guided diagnostic and therapeutic right thoracentesis yielding 1.2 of pleural fluid. Read by: Rowe Robert, PA-C Electronically Signed   By: Sandi Mariscal M.D.   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  #2 Recurrent right pleural effusion likely related to lymphoma possibly from decreased lymphatic drainage. Symptoms nearly resolved s/p thoracentesis with removal of 1.2L of pleural fluid. Additional element of pleural effusion from ? Diastolic dysfunction +steroids with fluid retention+ atelectasis from body habitus \  #3 Back pain with large paraspinal mass in the thoracic region with possible  epidural extension. No overt neurological symptoms related to this.  No significant back pain related to this curently  Plan Labs stable and no prohibitive new toxicities Patient appropriate to proceed with 3rd cycle of R-CHOP from today -PET scan is ordered and is to be completed before 4th chemo cycle in approximately 2 weeks with a follow up visit following -Trazodone Rx given to aid with sleeping and to be used PRN -Advised to continue use of OTC Claritin or zyrtec for sinus issues.  -Tessalon Perles Rx to be used PRN to aid with chronic productive cough.  -OTC PPI use advised to aid with GERD symptoms.     #4 .Paroxysmal atrial fibrillation - rate controlled and asymptomatic from this Plan -given referral to cardiology for continued management of this.  Patient Active Problem List   Diagnosis Date Noted  . SOB (shortness of breath) 09/26/2016  . Acute respiratory distress 09/25/2016  . Chronic low back pain 09/25/2016  . Hypokalemia 09/25/2016  . Recurrent right pleural effusion 09/25/2016  . DLBCL (diffuse large B cell lymphoma) ([place]) 08/17/2016  . Acute midline thoracic back pain   . Lymphadenopathy   . Morbid obesity with BMI of 50.0-59.9, adult ([place])   . NHL (non-Hodgkin's lymphoma) ([place])   . Abdominal pain, RUQ   . Pleural effusion on right 08/07/2016  . Abnormal EKG 08/07/2016  . 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 Anemia 08/07/2016  . GERD (gastroesophageal reflux disease) 08/07/2016  . Shortness of breath 08/07/2016   -continue f/u with PCP for other medical cares and for continue mx of Paroxysmal afib. -would recommend sleep study to evaluate for sleep apnea.  -RTC with Dr. Irene Limbo in 3 weeks with labs with PET scan prior to visit.   I spent 20 minutes counseling the patient face to face. The total time spent in the appointment was 30 minutes and more than 50% was on counseling and direct patient cares.    Sullivan Lone MD [place] AAHIVMS [facility] Hematology/Oncology  Physician [facility]  (Office):       [phone] (Work cell):  [phone] (Fax):           [phone]  This document serves as a record of services personally performed by Sullivan Lone, MD. It was created on her behalf by Alean Rinne, a trained medical scribe. The creation of this record is based on the scribe's personal observations and the provider's statements to them. This document has been checked and approved by the attending provider.

## 2016-10-04 ENCOUNTER — Encounter: Payer: Self-pay | Admitting: Hematology

## 2016-10-04 ENCOUNTER — Other Ambulatory Visit (HOSPITAL_BASED_OUTPATIENT_CLINIC_OR_DEPARTMENT_OTHER): Payer: Medicare Other

## 2016-10-04 ENCOUNTER — Ambulatory Visit (HOSPITAL_BASED_OUTPATIENT_CLINIC_OR_DEPARTMENT_OTHER): Payer: Medicare Other | Admitting: Hematology

## 2016-10-04 ENCOUNTER — Ambulatory Visit (HOSPITAL_BASED_OUTPATIENT_CLINIC_OR_DEPARTMENT_OTHER): Payer: Medicare Other

## 2016-10-04 ENCOUNTER — Ambulatory Visit: Payer: Medicare Other

## 2016-10-04 ENCOUNTER — Telehealth: Payer: Self-pay | Admitting: Hematology

## 2016-10-04 VITALS — BP 129/83 | HR 75 | Temp 98.1°F | Resp 20

## 2016-10-04 VITALS — BP 136/80 | HR 67 | Temp 98.3°F | Resp 20 | Ht 61.5 in | Wt 285.9 lb

## 2016-10-04 DIAGNOSIS — R05 Cough: Secondary | ICD-10-CM

## 2016-10-04 DIAGNOSIS — Z452 Encounter for adjustment and management of vascular access device: Secondary | ICD-10-CM | POA: Diagnosis not present

## 2016-10-04 DIAGNOSIS — Z5111 Encounter for antineoplastic chemotherapy: Secondary | ICD-10-CM

## 2016-10-04 DIAGNOSIS — C8338 Diffuse large B-cell lymphoma, lymph nodes of multiple sites: Secondary | ICD-10-CM

## 2016-10-04 DIAGNOSIS — J90 Pleural effusion, not elsewhere classified: Secondary | ICD-10-CM

## 2016-10-04 DIAGNOSIS — R059 Cough, unspecified: Secondary | ICD-10-CM

## 2016-10-04 DIAGNOSIS — I480 Paroxysmal atrial fibrillation: Secondary | ICD-10-CM

## 2016-10-04 DIAGNOSIS — Z5112 Encounter for antineoplastic immunotherapy: Secondary | ICD-10-CM

## 2016-10-04 LAB — COMPREHENSIVE METABOLIC PANEL
ALT: 20 U/L (ref 0–55)
ANION GAP: 8 meq/L (ref 3–11)
AST: 21 U/L (ref 5–34)
Albumin: 3 g/dL — ABNORMAL LOW (ref 3.5–5.0)
Alkaline Phosphatase: 51 U/L (ref 40–150)
BUN: 13 mg/dL (ref 7.0–26.0)
CHLORIDE: 109 meq/L (ref 98–109)
CO2: 24 meq/L (ref 22–29)
Calcium: 9.3 mg/dL (ref 8.4–10.4)
Creatinine: 0.9 mg/dL (ref 0.6–1.1)
EGFR: 73 mL/min/{1.73_m2} — AB (ref >90)
GLUCOSE: 103 mg/dL (ref 70–140)
Potassium: 4.1 mEq/L (ref 3.5–5.1)
SODIUM: 142 meq/L (ref 136–145)
Total Bilirubin: 0.31 mg/dL (ref 0.20–1.20)
Total Protein: 6.6 g/dL (ref 6.4–8.3)

## 2016-10-04 LAB — CBC & DIFF AND RETIC
BASO%: 0.4 % (ref 0.0–2.0)
Basophils Absolute: 0 10*3/uL (ref 0.0–0.1)
EOS%: 0.1 % (ref 0.0–7.0)
Eosinophils Absolute: 0 10*3/uL (ref 0.0–0.5)
HCT: 36 % (ref 34.8–46.6)
HGB: 11.3 g/dL — ABNORMAL LOW (ref 11.6–15.9)
Immature Retic Fract: 9 % (ref 1.60–10.00)
LYMPH%: 17.2 % (ref 14.0–49.7)
MCH: 27.6 pg (ref 25.1–34.0)
MCHC: 31.4 g/dL — AB (ref 31.5–36.0)
MCV: 88 fL (ref 79.5–101.0)
MONO#: 0.4 10*3/uL (ref 0.1–0.9)
MONO%: 5.2 % (ref 0.0–14.0)
NEUT%: 77.1 % — AB (ref 38.4–76.8)
NEUTROS ABS: 6.1 10*3/uL (ref 1.5–6.5)
PLATELETS: 327 10*3/uL (ref 145–400)
RBC: 4.09 10*6/uL (ref 3.70–5.45)
RDW: 16 % — ABNORMAL HIGH (ref 11.2–14.5)
RETIC CT ABS: 82.21 10*3/uL (ref 33.70–90.70)
Retic %: 2.01 % (ref 0.70–2.10)
WBC: 7.9 10*3/uL (ref 3.9–10.3)
lymph#: 1.4 10*3/uL (ref 0.9–3.3)

## 2016-10-04 LAB — LACTATE DEHYDROGENASE: LDH: 223 U/L (ref 125–245)

## 2016-10-04 MED ORDER — DIPHENHYDRAMINE HCL 25 MG PO CAPS
ORAL_CAPSULE | ORAL | Status: AC
Start: 2016-10-04 — End: 2016-10-04
  Filled 2016-10-04: qty 2

## 2016-10-04 MED ORDER — HEPARIN SOD (PORK) LOCK FLUSH 100 UNIT/ML IV SOLN
500.0000 [IU] | Freq: Once | INTRAVENOUS | Status: AC | PRN
  Administered 2016-10-04: 500 [IU]
  Filled 2016-10-04: qty 5

## 2016-10-04 MED ORDER — DIPHENHYDRAMINE HCL 25 MG PO CAPS
50.0000 mg | ORAL_CAPSULE | Freq: Once | ORAL | Status: AC
  Administered 2016-10-04: 50 mg via ORAL

## 2016-10-04 MED ORDER — VINCRISTINE SULFATE CHEMO INJECTION 1 MG/ML
2.0000 mg | Freq: Once | INTRAVENOUS | Status: AC
  Administered 2016-10-04: 2 mg via INTRAVENOUS
  Filled 2016-10-04: qty 2

## 2016-10-04 MED ORDER — SODIUM CHLORIDE 0.9 % IV SOLN
750.0000 mg/m2 | Freq: Once | INTRAVENOUS | Status: AC
  Administered 2016-10-04: 1780 mg via INTRAVENOUS
  Filled 2016-10-04: qty 89

## 2016-10-04 MED ORDER — DEXAMETHASONE SODIUM PHOSPHATE 10 MG/ML IJ SOLN
INTRAMUSCULAR | Status: AC
  Filled 2016-10-04: qty 1

## 2016-10-04 MED ORDER — BENZONATATE 100 MG PO CAPS: 100.0000 mg | ORAL_CAPSULE | Freq: Three times a day (TID) | ORAL | 0 refills | Status: DC | PRN

## 2016-10-04 MED ORDER — TRAZODONE HCL 50 MG PO TABS: 50.0000 mg | ORAL_TABLET | Freq: Every evening | ORAL | 0 refills | Status: DC | PRN

## 2016-10-04 MED ORDER — PALONOSETRON HCL INJECTION 0.25 MG/5ML
INTRAVENOUS | Status: AC
  Filled 2016-10-04: qty 5

## 2016-10-04 MED ORDER — ALTEPLASE 2 MG IJ SOLR
2.0000 mg | Freq: Once | INTRAMUSCULAR | Status: AC | PRN
  Administered 2016-10-04: 2 mg
  Filled 2016-10-04: qty 2

## 2016-10-04 MED ORDER — DOXORUBICIN HCL CHEMO IV INJECTION 2 MG/ML
50.0000 mg/m2 | Freq: Once | INTRAVENOUS | Status: AC
  Administered 2016-10-04: 118 mg via INTRAVENOUS
  Filled 2016-10-04: qty 59

## 2016-10-04 MED ORDER — ACETAMINOPHEN 325 MG PO TABS
650.0000 mg | ORAL_TABLET | Freq: Once | ORAL | Status: AC
  Administered 2016-10-04: 650 mg via ORAL

## 2016-10-04 MED ORDER — RITUXIMAB CHEMO INJECTION 500 MG/50ML
375.0000 mg/m2 | Freq: Once | INTRAVENOUS | Status: AC
  Administered 2016-10-04: 900 mg via INTRAVENOUS
  Filled 2016-10-04: qty 40

## 2016-10-04 MED ORDER — DEXAMETHASONE SODIUM PHOSPHATE 10 MG/ML IJ SOLN
10.0000 mg | Freq: Once | INTRAMUSCULAR | Status: AC
  Administered 2016-10-04: 10 mg via INTRAVENOUS

## 2016-10-04 MED ORDER — PALONOSETRON HCL INJECTION 0.25 MG/5ML
0.2500 mg | Freq: Once | INTRAVENOUS | Status: AC
  Administered 2016-10-04: 0.25 mg via INTRAVENOUS

## 2016-10-04 MED ORDER — PREDNISONE 20 MG PO TABS: 60.0000 mg | ORAL_TABLET | Freq: Every day | ORAL | 5 refills | Status: DC

## 2016-10-04 MED ORDER — ACETAMINOPHEN 325 MG PO TABS
ORAL_TABLET | ORAL | Status: AC
  Filled 2016-10-04: qty 2

## 2016-10-04 MED ORDER — SODIUM CHLORIDE 0.9 % IV SOLN
Freq: Once | INTRAVENOUS | Status: AC
  Administered 2016-10-04: 12:00:00 via INTRAVENOUS

## 2016-10-04 MED ORDER — SODIUM CHLORIDE 0.9% FLUSH
10.0000 mL | INTRAVENOUS | Status: DC | PRN
  Administered 2016-10-04: 10 mL
  Filled 2016-10-04: qty 10

## 2016-10-04 NOTE — Telephone Encounter (Signed)
Gave patient AVS and calendar of upcoming October appointments °

## 2016-10-04 NOTE — Patient Instructions (Signed)
Thank you for choosing [facility] to provide your oncology and hematology care.  To afford each patient quality time with our providers, please arrive 30 minutes before your scheduled appointment time.  If you arrive late for your appointment, you may be asked to reschedule.  We strive to give you quality time with our providers, and arriving late affects you and other patients whose appointments are after yours.   If you are a no show for multiple scheduled visits, you may be dismissed from the clinic at the providers discretion.    Again, thank you for choosing [facility], our hope is that these requests will decrease the amount of time that you wait before being seen by our physicians.  ______________________________________________________________________  Should you have questions after your visit to the [facility], please contact our office at [phone] between the hours of 8:30 and 4:30 p.m.    Voicemails left after 4:30p.m will not be returned until the following business day.    For prescription refill requests, please have your pharmacy contact us directly.  Please also try to allow 48 hours for prescription requests.    Please contact the scheduling department for questions regarding scheduling.  For scheduling of procedures such as PET scans, CT scans, MRI, Ultrasound, etc please contact central scheduling at [phone].    Resources For Cancer Patients and Caregivers:   Oncolink.org:  A wonderful resource for patients and healthcare providers for information regarding your disease, ways to tract your treatment, what to expect, etc.     American Cancer Society:  800-227-2345  Can help patients locate various types of support and financial assistance  Cancer Care: 1-800-813-HOPE (4673) Provides financial assistance, online support groups, medication/co-pay assistance.    [place] DSS:  336-641-3447 Where to apply for food  stamps, Medicaid, and utility assistance  Medicare Rights Center: 800-333-4114 Helps people with Medicare understand their rights and benefits, navigate the Medicare system, and secure the quality healthcare they deserve  SCAT: 336-333-6589 [place] [employer]'s shared-ride transportation service for eligible riders who have a disability that prevents them from riding the fixed route bus.    For additional information on assistance programs please contact our social worker:   Grier Hock/Abigail Elmore:  [phone]            

## 2016-10-05 ENCOUNTER — Encounter (HOSPITAL_COMMUNITY): Payer: Self-pay

## 2016-10-05 ENCOUNTER — Ambulatory Visit (HOSPITAL_COMMUNITY)
Admission: RE | Admit: 2016-10-05 | Discharge: 2016-10-05 | Disposition: A | Discharge status: Home / Self Care | Payer: Medicare Other | Source: Ambulatory Visit | Attending: Hematology | Admitting: Hematology

## 2016-10-05 ENCOUNTER — Emergency Department (HOSPITAL_COMMUNITY)
Admission: EM | Admit: 2016-10-05 | Discharge: 2016-10-05 | Disposition: A | Discharge status: Home / Self Care | Payer: Medicare Other | Source: Home / Self Care | Attending: Emergency Medicine | Admitting: Emergency Medicine

## 2016-10-05 ENCOUNTER — Encounter (HOSPITAL_COMMUNITY): Payer: Self-pay | Admitting: Family Medicine

## 2016-10-05 DIAGNOSIS — C8338 Diffuse large B-cell lymphoma, lymph nodes of multiple sites: Secondary | ICD-10-CM

## 2016-10-05 DIAGNOSIS — Z87891 Personal history of nicotine dependence: Secondary | ICD-10-CM

## 2016-10-05 DIAGNOSIS — R112 Nausea with vomiting, unspecified: Secondary | ICD-10-CM

## 2016-10-05 DIAGNOSIS — C8590 Non-Hodgkin lymphoma, unspecified, unspecified site: Secondary | ICD-10-CM

## 2016-10-05 DIAGNOSIS — Z5111 Encounter for antineoplastic chemotherapy: Secondary | ICD-10-CM | POA: Insufficient documentation

## 2016-10-05 DIAGNOSIS — I10 Essential (primary) hypertension: Secondary | ICD-10-CM

## 2016-10-05 DIAGNOSIS — D649 Anemia, unspecified: Secondary | ICD-10-CM | POA: Insufficient documentation

## 2016-10-05 DIAGNOSIS — Z79899 Other long term (current) drug therapy: Secondary | ICD-10-CM | POA: Insufficient documentation

## 2016-10-05 LAB — CBC WITH DIFFERENTIAL/PLATELET
Basophils Absolute: 0 10*3/uL (ref 0.0–0.1)
Basophils Relative: 0 %
Eosinophils Absolute: 0 10*3/uL (ref 0.0–0.7)
Eosinophils Relative: 0 %
HCT: 32.9 % — ABNORMAL LOW (ref 36.0–46.0)
Hemoglobin: 10.8 g/dL — ABNORMAL LOW (ref 12.0–15.0)
Lymphocytes Relative: 6 %
Lymphs Abs: 1 10*3/uL (ref 0.7–4.0)
MCH: 27.8 pg (ref 26.0–34.0)
MCHC: 32.8 g/dL (ref 30.0–36.0)
MCV: 84.6 fL (ref 78.0–100.0)
Monocytes Absolute: 0.5 10*3/uL (ref 0.1–1.0)
Monocytes Relative: 3 %
Neutro Abs: 15.7 10*3/uL — ABNORMAL HIGH (ref 1.7–7.7)
Neutrophils Relative %: 91 %
Platelets: 290 10*3/uL (ref 150–400)
RBC: 3.89 MIL/uL (ref 3.87–5.11)
RDW: 15.4 % (ref 11.5–15.5)
WBC: 17.2 10*3/uL — ABNORMAL HIGH (ref 4.0–10.5)

## 2016-10-05 LAB — BASIC METABOLIC PANEL
Anion gap: 9 (ref 5–15)
BUN: 14 mg/dL (ref 6–20)
CO2: 23 mmol/L (ref 22–32)
Calcium: 9.2 mg/dL (ref 8.9–10.3)
Chloride: 109 mmol/L (ref 101–111)
Creatinine, Ser: 0.79 mg/dL (ref 0.44–1.00)
GFR calc Af Amer: >60 mL/min (ref >60)
GFR calc non Af Amer: >60 mL/min (ref >60)
Glucose, Bld: 171 mg/dL — ABNORMAL HIGH (ref 65–99)
Potassium: 3.4 mmol/L — ABNORMAL LOW (ref 3.5–5.1)
Sodium: 141 mmol/L (ref 135–145)

## 2016-10-05 LAB — CSF CELL COUNT WITH DIFFERENTIAL
RBC COUNT CSF: 13 /mm3 — AB
TUBE #: 4
WBC CSF: 1 /mm3 (ref 0–5)

## 2016-10-05 LAB — PROTEIN, CSF: Total  Protein, CSF: 55 mg/dL — ABNORMAL HIGH (ref 15–45)

## 2016-10-05 LAB — GLUCOSE, CSF: Glucose, CSF: 77 mg/dL — ABNORMAL HIGH (ref 40–70)

## 2016-10-05 MED ORDER — SODIUM CHLORIDE 0.9 % IV BOLUS (SEPSIS)
1000.0000 mL | Freq: Once | INTRAVENOUS | Status: AC
  Administered 2016-10-05: 1000 mL via INTRAVENOUS

## 2016-10-05 MED ORDER — LIDOCAINE HCL 1 % IJ SOLN
INTRAMUSCULAR | Status: AC
  Administered 2016-10-05: 30 mL via INTRATHECAL
  Filled 2016-10-05: qty 20

## 2016-10-05 MED ORDER — SODIUM CHLORIDE 0.9 % IJ SOLN
Freq: Once | INTRAMUSCULAR | Status: AC
  Administered 2016-10-05: 13:00:00 via INTRATHECAL
  Filled 2016-10-05: qty 0.48

## 2016-10-05 MED ORDER — LORAZEPAM 2 MG/ML IJ SOLN: 1.0000 mg | Freq: Four times a day (QID) | INTRAMUSCULAR | Status: DC | PRN

## 2016-10-05 MED ORDER — PROCHLORPERAZINE EDISYLATE 5 MG/ML IJ SOLN
10.0000 mg | Freq: Once | INTRAMUSCULAR | Status: AC
  Administered 2016-10-05: 10 mg via INTRAVENOUS
  Filled 2016-10-05: qty 2

## 2016-10-05 MED ORDER — SODIUM CHLORIDE 0.9 % IV SOLN
8.0000 mg | Freq: Three times a day (TID) | INTRAVENOUS | Status: DC | PRN
  Filled 2016-10-05: qty 4

## 2016-10-05 MED ORDER — SODIUM CHLORIDE 0.9 % IV SOLN: INTRAVENOUS | Status: DC

## 2016-10-05 NOTE — Discharge Instructions (Signed)
Please read attached information. If you experience any new or worsening signs or symptoms please return to the emergency room for evaluation. Please follow-up with your primary care provider or specialist as discussed. Please use medication prescribed only as directed and discontinue taking if you have any concerning signs or symptoms.   °

## 2016-10-05 NOTE — Progress Notes (Signed)
Dr. Lebron Conners notified of patient going to ER.

## 2016-10-05 NOTE — Progress Notes (Signed)
Patient ID: Jo Collins, female   DOB: July 30, 1950, 66 y.o.   MRN: [id number] I received a phone call from the nurses in short-stay that the patient is experiencing nausea and vomiting vomiting and some hypertension, systolic blood pressure proximally 190.  She also has diarrhea although the diarrhea was present prior to the lumbar puncture.    Given the patient's recent therapies and general complexity, I feel that the patient would best be assessed in the emergency room or in consultation with oncology rather than managing the patient's nausea and hypertension myself.  I instructed the short-stay nurses to bring the patient to the emergency department for further workup.  Nausea and vomiting would be a very unusual side effect from the lumbar puncture and intrathecal chemotherapeutic regimen delivered today, but could be associated with other concurrent treatment.

## 2016-10-05 NOTE — Procedures (Signed)
CLINICAL DATA: [DIFFUSE LARGE B-CELL LYMPHOMA] EXAM: FLUOROSCOPICALLY GUIDED LUMBAR PUNCTURE FOR INTRATHECAL CHEMOTHERAPY DIAGNOSTIC LUMBAR PUNCTURE UNDER FLUOROSCOPIC GUIDANCE   TECHNIQUE:  I discussed the risks (including hemorrhage, infection, headache, and nerve damage, among others), benefits, and alternatives to fluoroscopically guided lumbar puncture with intrathecal methotrexate administration with the patient.  We specifically discussed the technical likelihood of success of the procedure. The patient understood and elected to undergo the procedure.   Standard time-out was employed. Following sterile skin prep and local anesthetic administration consisting of 1 percent lidocaine, a 22 gauge 5 inch spinal needle was advanced in the midline at L2-3.  It might be feasible to use a 4.5 inch needle rather than the 5 inch, but the standard 3.5 inch in length needle would not be adequate due to body habitus.  There was a very small midline window that was difficult to access.  Accordingly I turned the patient slightly an used a paramedian approach at the same level.  The needle was advanced difficulty into the thecal sac. Clear CSF was returned.  Opening pressure was [not requested or obtained].    12 cc of clear CSF was collected.  CSF flow was slow but steady.    Subsequently the medication syringe containing 12 mg of methotrexate was attached and very slowly injected.  The patient did note mild left buttock discomfort during injection.  I removed the syringe and there was some backflow of medication, suggesting that the positioning of the needle tip was still intrathecal.  Also, the injection was proceeding easily without significant pressure required to inject, also typical of intrathecal positioning.  After all of the medication have been administered, the needle was removed and the skin cleansed and bandaged.  Aside from the mild left buttock discomfort common no immediate  complications were observed.    FLUOROSCOPY TIME: [1 minutes, 16 seconds]  IMPRESSION: [ 1. Technically successful fluoroscopically guided lumbar puncture with intrathecal methotrexate administration. 12 cc of clear CSF were obtained for diagnostic purposes.  The patient did note mild left buttock discomfort after the exam, we will continue to monitor during her observation.  ]

## 2016-10-05 NOTE — ED Provider Notes (Signed)
Medical screening examination/treatment/procedure(s) were conducted as a shared visit with non-physician practitioner(s) and myself.  I personally evaluated the patient during the encounter. Briefly, the patient is a 66 y.o. female with a history of Munchhausen's lymphoma who presents to the emergency department with nausea and vomiting following chemotherapy. Abdomen benign. Labs grossly reassuring. Treat her symptomatically and able to tolerate oral intake. No indication for imaging at this time.  The patient is safe for discharge with strict return precautions.     Fatima Blank, MD 10/06/16 [phone]

## 2016-10-05 NOTE — Progress Notes (Signed)
Dr. Janeece Fitting notified of pt nausea and vomiting by Royce Macadamia.  Pt to be transferred to ED for evaluation per MD request. Stacey Drain

## 2016-10-05 NOTE — Progress Notes (Signed)
Pt called RN & c/o nausea & stated she needed to go to BR. Pt began vomiting.  Pt vomited approximately 250cc of light brown emesis. Pt then assisted up to BR and had large loose BM.  Stacey Drain

## 2016-10-05 NOTE — ED Notes (Signed)
Provided patient ginger ale and ice for fluid challenge.

## 2016-10-05 NOTE — ED Provider Notes (Signed)
[facility] DEPT Provider Note   CSN: [id number] Arrival date & time: 10/05/16  1541     History   Chief Complaint Chief Complaint  Patient presents with  . Nausea  . Emesis    HPI Jo Collins is a 66 y.o. female.  HPI   3 -year-old female with a past medical history significant for non-Hodgkin's lymphoma recently diagnosed and started on R CHOP, presenting to the emergency room with nausea vomiting. Patient notes that she received intrathecal chemotherapy today. She notes approximately 2 hours after receiving therapy she developed extreme nausea and vomiting. Patient denies any preceding symptoms including fever, chills, abdominal pain. She notes the vomitus nonbloody, no history of nausea or vomiting with previous chemotherapy. This is patient's third session, first intrathecal. She notes she was recently hospitalized and discharged on 09/28/2016 secondary to pleural effusion, she reports small cough that has persisted since then, no change. Patient also notes that since hospitalization she's had soft stools, no acute diarrhea. She denies any urinary changes, denies any present abdominal pain     Past Medical History:  Diagnosis Date  . GERD (gastroesophageal reflux disease)   . NHL (non-Hodgkin's lymphoma) [facility])     Patient Active Problem List   Diagnosis Date Noted  . SOB (shortness of breath) 09/26/2016  . Acute respiratory distress 09/25/2016  . Chronic low back pain 09/25/2016  . Hypokalemia 09/25/2016  . Recurrent right pleural effusion 09/25/2016  . DLBCL (diffuse large B cell lymphoma) ([place]) 08/17/2016  . Acute midline thoracic back pain   . Lymphadenopathy   . Morbid obesity with BMI of 50.0-59.9, adult ([place])   . NHL (non-Hodgkin's lymphoma) ([place])   . Abdominal pain, RUQ   . Pleural effusion on right 08/07/2016  . Abnormal EKG 08/07/2016  . 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 Anemia 08/07/2016  . GERD (gastroesophageal reflux disease) 08/07/2016  .  Shortness of breath 08/07/2016    Past Surgical History:  Procedure Laterality Date  . ABDOMINAL HYSTERECTOMY    . IR FLUORO GUIDE PORT INSERTION RIGHT  08/18/2016  . IR THORACENTESIS ASP PLEURAL SPACE W/IMG GUIDE  08/07/2016  . IR THORACENTESIS ASP PLEURAL SPACE W/IMG GUIDE  08/14/2016  . IR US GUIDE VASC ACCESS RIGHT  08/18/2016    OB History    No data available       Home Medications    Prior to Admission medications   Medication Sig Start Date End Date Taking? Authorizing Provider  acetaminophen (TYLENOL) 500 MG tablet Take 1,000 mg by mouth every 6 (six) hours as needed for moderate pain.   Yes [provider]  benzonatate (TESSALON) 100 MG capsule Take 1 capsule (100 mg total) by mouth 3 (three) times daily as needed for cough. 10/04/16  Yes Brunetta Genera, MD  diphenhydrAMINE (BENADRYL) 25 MG tablet Take 25-50 mg by mouth daily as needed for allergies (chemotherapy infusion).    Yes [provider]  lidocaine-prilocaine (EMLA) cream Apply to affected area once 08/22/16  Yes Brunetta Genera, MD  lidocaine-prilocaine (EMLA) cream Apply 1 application topically as needed. Apply to port-a-cath assess site 45-60 mins prior to chemotherapy. 08/22/16  Yes Brunetta Genera, MD  loratadine (CLARITIN) 10 MG tablet Take 10 mg by mouth daily as needed for allergies.   Yes [provider]  Multiple Vitamin (MULTIVITAMIN WITH MINERALS) TABS tablet Take 1 tablet by mouth daily.   Yes [provider]  polyethylene glycol (MIRALAX / GLYCOLAX) packet Take 17 g by mouth daily.  08/15/16  Yes Dhungel, Nishant, MD  predniSONE (DELTASONE) 20 MG tablet Take 3 tablets (60 mg total) by mouth daily. Take on days 1-5 of chemotherapy. 10/04/16  Yes Brunetta Genera, MD  prochlorperazine (COMPAZINE) 10 MG tablet Take 1 tablet (10 mg total) by mouth every 6 (six) hours as needed (Nausea or vomiting). 08/22/16  Yes Brunetta Genera, MD  traZODone (DESYREL) 50 MG  tablet Take 1 tablet (50 mg total) by mouth at bedtime as needed for sleep. 10/04/16  Yes Brunetta Genera, MD  vitamin B-12 500 MCG tablet Take 1 tablet (500 mcg total) by mouth daily. Patient taking differently: Take 500 mcg by mouth every other day.  08/16/16  Yes Dhungel, Flonnie Overman, MD    Family History Family History  Problem Relation Age of Onset  . COPD Mother   . Diabetes Mellitus II Sister   . Diabetes Mellitus II Brother   . Diabetes Mellitus II Sister   . Stroke Sister   . Hypertension Sister   . Diabetes Mellitus II Sister     Social History Social History  Substance Use Topics  . Smoking status: Former Smoker    Packs/day: 0.50    Years: 47.00    Types: Cigarettes    Quit date: 07/28/2016  . Smokeless tobacco: Never Used  . Alcohol use No     Allergies   Patient has no known allergies.   Review of Systems Review of Systems  All other systems reviewed and are negative.    Physical Exam Updated Vital Signs BP (!) 159/86 (BP Location: Left Arm)   Pulse (!) 59   Temp 97.7 F (36.5 C) (Oral)   Resp 20   Ht 5' 1.5" (1.562 m)   Wt 130.2 kg (287 lb)   SpO2 100%   BMI 53.35 kg/m   Physical Exam  Constitutional: She is oriented to person, place, and time. She appears well-developed and well-nourished.  HENT:  Head: Normocephalic and atraumatic.  Eyes: Pupils are equal, round, and reactive to light. Conjunctivae are normal. Right eye exhibits no discharge. Left eye exhibits no discharge. No scleral icterus.  Neck: Normal range of motion. No JVD present. No tracheal deviation present.  Cardiovascular: Normal rate and regular rhythm.   Pulmonary/Chest: Effort normal. No stridor.  Abdominal: Soft. She exhibits no distension and no mass. There is no tenderness. There is no rebound and no guarding. No hernia.  Neurological: She is alert and oriented to person, place, and time. Coordination normal.  Psychiatric: She has a normal mood and affect. Her behavior  is normal. Judgment and thought content normal.  Nursing note and vitals reviewed.    ED Treatments / Results  Labs (all labs ordered are listed, but only abnormal results are displayed) Labs Reviewed  CBC WITH DIFFERENTIAL/PLATELET - Abnormal; Notable for the following:       Result Value   WBC 17.2 (*)    Hemoglobin 10.8 (*)    HCT 32.9 (*)    Neutro Abs 15.7 (*)    All other components within normal limits  BASIC METABOLIC PANEL - Abnormal; Notable for the following:    Potassium 3.4 (*)    Glucose, Bld 171 (*)    All other components within normal limits    EKG  EKG Interpretation None       Radiology Dg Fluoro Guided Loc Of Needle/cath Tip For Spinal Inject Lt  Result Date: 10/05/2016 CLINICAL DATA:  DIFFUSE LARGE B-CELL LYMPHOMA EXAM: FLUOROSCOPICALLY GUIDED LUMBAR  PUNCTURE FOR INTRATHECAL CHEMOTHERAPY DIAGNOSTIC LUMBAR PUNCTURE UNDER FLUOROSCOPIC GUIDANCE TECHNIQUE: I discussed the risks (including hemorrhage, infection, headache, and nerve damage, among others), benefits, and alternatives to fluoroscopically guided lumbar puncture with intrathecal methotrexate administration with the patient. We specifically discussed the technical likelihood of success of the procedure. The patient understood and elected to undergo the procedure. Standard time-out was employed. Following sterile skin prep and local anesthetic administration consisting of 1 percent lidocaine, a 22 gauge 5 inch spinal needle was advanced in the midline at L2-3. It might be feasible to use a 4.5 inch needle rather than the 5 inch, but the standard 3.5 inch in length needle would not be adequate due to body habitus. There was a very small midline window that was difficult to access. Accordingly I turned the patient slightly an used a paramedian approach at the same level. The needle was advanced difficulty into the thecal sac. Clear CSF was returned. Opening pressure was not requested or obtained. 12 cc of clear  CSF was collected. CSF flow was slow but steady. Slow rate of flow partially attributable to the long needle length and 22 gauge size. Subsequently the medication syringe containing 12 mg of methotrexate was attached and very slowly injected. The patient did note mild left buttock discomfort during injection. I removed the syringe and there was some backflow of medication, suggesting that the positioning of the needle tip was still intrathecal. Also, the injection was proceeding easily without significant pressure required to inject, also typical of intrathecal positioning. After all of the medication have been administered, the needle was removed and the skin cleansed and bandaged. Aside from the mild left buttock discomfort common no immediate complications were observed. FLUOROSCOPY TIME:  1 minutes, 16 seconds IMPRESSION: 1. Technically successful fluoroscopically guided lumbar puncture with intrathecal methotrexate administration. 12 cc of clear CSF were obtained for diagnostic purposes. The patient did note mild left buttock discomfort after the exam, we will continue to monitor during her observation. Electronically Signed   By: Van Clines M.D.   On: 10/05/2016 13:37    Procedures Procedures (including critical care time)  Medications Ordered in ED Medications  prochlorperazine (COMPAZINE) injection 10 mg (10 mg Intravenous Given 10/05/16 1709)  sodium chloride 0.9 % bolus 1,000 mL (0 mLs Intravenous Stopped 10/05/16 1918)     Initial Impression / Assessment and Plan / ED Course  I have reviewed the triage vital signs and the nursing notes.  Pertinent labs & imaging results that were available during my care of the patient were reviewed by me and considered in my medical decision making (see chart for details).     Final Clinical Impressions(s) / ED Diagnoses   Final diagnoses:  Non-intractable vomiting with nausea, unspecified vomiting type    Labs: CBC,  BMP  Imaging:  Consults:  Therapeutics: Reglan, Normal saline  Discharge Meds:   Assessment/Plan: 66 year old female presents today with acute onset of nausea and vomiting. She was given anti-medics here which completely resolved her symptoms, no more vomiting, tolerating by mouth in no acute distress. Patient denies any other significant complaints, she denies any abdominal pain, fever. Likely Secondary to chemotherapy today.  Patient discharged with strict return impressions, follow up information. She verbalized understanding and agreement to today's plan had no further questions or concerns of discharge.     New Prescriptions Discharge Medication List as of 10/05/2016  8:11 PM       Okey Regal, PA-C 10/05/16 2237

## 2016-10-05 NOTE — ED Notes (Addendum)
Merry Proud, [place] reported patient is going to wait for a few more minutes to discharge due to she was having difficulties with returning nausea.

## 2016-10-05 NOTE — ED Triage Notes (Signed)
Patient has Non-Hodgkin's lymphoma and had an intro-fecla chemotherapy today at the [facility]. This is patients third time having the procedure. However, today she developed nausea and vomiting while lying for 4 hours post precedure at Short Stay. Per Ria Comment, from short stay, patient has vomited 4 times.

## 2016-10-05 NOTE — ED Notes (Signed)
Bed: [facility]38 Expected date:  Expected time:  Means of arrival:  Comments: Short Stay Patient

## 2016-10-06 ENCOUNTER — Telehealth: Payer: Self-pay

## 2016-10-06 ENCOUNTER — Encounter: Payer: Medicare Other | Admitting: Physical Therapy

## 2016-10-06 ENCOUNTER — Ambulatory Visit: Payer: Medicare Other

## 2016-10-06 LAB — ACID FAST CULTURE WITH REFLEXED SENSITIVITIES: ACID FAST CULTURE - AFSCU3: NEGATIVE

## 2016-10-06 NOTE — Telephone Encounter (Signed)
Pt had intrathecal methotrexate in the hospital yesterday. She developed N/V and was transported to ED. She stayed in ER until 10 pm with intractable N/V.  Now she has to stay flat until 1300. She is due for neulasta this AM that she cannot make. She would like to r/s to tomorrow about 10 am.  She had RCHOP on 10/3.  S/w Dr Lebron Conners and OK to give neulasta on Saturday.  Called pharmacy and had order changed to Saturday.

## 2016-10-06 NOTE — Telephone Encounter (Signed)
Changed injection date to 10/6 per provider request/ per 10/5 sch message

## 2016-10-07 ENCOUNTER — Emergency Department (HOSPITAL_COMMUNITY): Payer: Medicare Other

## 2016-10-07 ENCOUNTER — Other Ambulatory Visit: Payer: Self-pay

## 2016-10-07 ENCOUNTER — Ambulatory Visit (HOSPITAL_BASED_OUTPATIENT_CLINIC_OR_DEPARTMENT_OTHER): Payer: Medicare Other

## 2016-10-07 ENCOUNTER — Encounter (HOSPITAL_COMMUNITY): Payer: Self-pay | Admitting: Emergency Medicine

## 2016-10-07 ENCOUNTER — Inpatient Hospital Stay (HOSPITAL_COMMUNITY)
Admission: EM | Admit: 2016-10-07 | Discharge: 2016-10-10 | DRG: 841 | Disposition: A | Discharge status: Home / Self Care | Payer: Medicare Other | Attending: Internal Medicine | Admitting: Internal Medicine

## 2016-10-07 VITALS — BP 143/84 | HR 81 | Temp 98.0°F | Resp 22

## 2016-10-07 DIAGNOSIS — T380X5A Adverse effect of glucocorticoids and synthetic analogues, initial encounter: Secondary | ICD-10-CM | POA: Diagnosis present

## 2016-10-07 DIAGNOSIS — C8338 Diffuse large B-cell lymphoma, lymph nodes of multiple sites: Principal | ICD-10-CM | POA: Diagnosis present

## 2016-10-07 DIAGNOSIS — Z9221 Personal history of antineoplastic chemotherapy: Secondary | ICD-10-CM

## 2016-10-07 DIAGNOSIS — Z833 Family history of diabetes mellitus: Secondary | ICD-10-CM

## 2016-10-07 DIAGNOSIS — Z8249 Family history of ischemic heart disease and other diseases of the circulatory system: Secondary | ICD-10-CM

## 2016-10-07 DIAGNOSIS — C8330 Diffuse large B-cell lymphoma, unspecified site: Secondary | ICD-10-CM | POA: Diagnosis present

## 2016-10-07 DIAGNOSIS — J910 Malignant pleural effusion: Secondary | ICD-10-CM | POA: Diagnosis present

## 2016-10-07 DIAGNOSIS — Z5189 Encounter for other specified aftercare: Secondary | ICD-10-CM | POA: Diagnosis not present

## 2016-10-07 DIAGNOSIS — R059 Cough, unspecified: Secondary | ICD-10-CM

## 2016-10-07 DIAGNOSIS — T458X5A Adverse effect of other primarily systemic and hematological agents, initial encounter: Secondary | ICD-10-CM | POA: Diagnosis present

## 2016-10-07 DIAGNOSIS — D729 Disorder of white blood cells, unspecified: Secondary | ICD-10-CM

## 2016-10-07 DIAGNOSIS — Z823 Family history of stroke: Secondary | ICD-10-CM

## 2016-10-07 DIAGNOSIS — Z87891 Personal history of nicotine dependence: Secondary | ICD-10-CM

## 2016-10-07 DIAGNOSIS — R0602 Shortness of breath: Secondary | ICD-10-CM | POA: Diagnosis not present

## 2016-10-07 DIAGNOSIS — Z90710 Acquired absence of both cervix and uterus: Secondary | ICD-10-CM

## 2016-10-07 DIAGNOSIS — J90 Pleural effusion, not elsewhere classified: Secondary | ICD-10-CM | POA: Diagnosis present

## 2016-10-07 DIAGNOSIS — K219 Gastro-esophageal reflux disease without esophagitis: Secondary | ICD-10-CM | POA: Diagnosis present

## 2016-10-07 DIAGNOSIS — Z9889 Other specified postprocedural states: Secondary | ICD-10-CM

## 2016-10-07 DIAGNOSIS — R05 Cough: Secondary | ICD-10-CM

## 2016-10-07 LAB — POCT I-STAT TROPONIN I: TROPONIN I, POC: 0 ng/mL (ref 0.00–0.08)

## 2016-10-07 MED ORDER — PEGFILGRASTIM INJECTION 6 MG/0.6ML ~~LOC~~
6.0000 mg | PREFILLED_SYRINGE | Freq: Once | SUBCUTANEOUS | Status: AC
  Administered 2016-10-07: 6 mg via SUBCUTANEOUS

## 2016-10-07 MED ORDER — ALBUTEROL SULFATE (2.5 MG/3ML) 0.083% IN NEBU
5.0000 mg | INHALATION_SOLUTION | Freq: Once | RESPIRATORY_TRACT | Status: AC
  Administered 2016-10-07: 5 mg via RESPIRATORY_TRACT
  Filled 2016-10-07: qty 6

## 2016-10-07 NOTE — ED Notes (Signed)
Pt requested to not access port at this time

## 2016-10-07 NOTE — Patient Instructions (Signed)
Pegfilgrastim injection What is this medicine? PEGFILGRASTIM (PEG fil gra stim) is a long-acting granulocyte colony-stimulating factor that stimulates the growth of neutrophils, a type of white blood cell important in the body's fight against infection. It is used to reduce the incidence of fever and infection in patients with certain types of cancer who are receiving chemotherapy that affects the bone marrow, and to increase survival after being exposed to high doses of radiation. This medicine may be used for other purposes; ask your health care provider or pharmacist if you have questions. COMMON BRAND NAME(S): Neulasta What should I tell my health care provider before I take this medicine? They need to know if you have any of these conditions: -kidney disease -latex allergy -ongoing radiation therapy -sickle cell disease -skin reactions to acrylic adhesives (On-Body Injector only) -an unusual or allergic reaction to pegfilgrastim, filgrastim, other medicines, foods, dyes, or preservatives -pregnant or trying to get pregnant -breast-feeding How should I use this medicine? This medicine is for injection under the skin. If you get this medicine at home, you will be taught how to prepare and give the pre-filled syringe or how to use the On-body Injector. Refer to the patient Instructions for Use for detailed instructions. Use exactly as directed. Tell your healthcare provider immediately if you suspect that the On-body Injector may not have performed as intended or if you suspect the use of the On-body Injector resulted in a missed or partial dose. It is important that you put your used needles and syringes in a special sharps container. Do not put them in a trash can. If you do not have a sharps container, call your pharmacist or healthcare provider to get one. Talk to your pediatrician regarding the use of this medicine in children. While this drug may be prescribed for selected conditions,  precautions do apply. Overdosage: If you think you have taken too much of this medicine contact a poison control center or emergency room at once. NOTE: This medicine is only for you. Do not share this medicine with others. What if I miss a dose? It is important not to miss your dose. Call your doctor or health care professional if you miss your dose. If you miss a dose due to an On-body Injector failure or leakage, a new dose should be administered as soon as possible using a single prefilled syringe for manual use. What may interact with this medicine? Interactions have not been studied. Give your health care provider a list of all the medicines, herbs, non-prescription drugs, or dietary supplements you use. Also tell them if you smoke, drink alcohol, or use illegal drugs. Some items may interact with your medicine. This list may not describe all possible interactions. Give your health care provider a list of all the medicines, herbs, non-prescription drugs, or dietary supplements you use. Also tell them if you smoke, drink alcohol, or use illegal drugs. Some items may interact with your medicine. What should I watch for while using this medicine? You may need blood work done while you are taking this medicine. If you are going to need a MRI, CT scan, or other procedure, tell your doctor that you are using this medicine (On-Body Injector only). What side effects may I notice from receiving this medicine? Side effects that you should report to your doctor or health care professional as soon as possible: -allergic reactions like skin rash, itching or hives, swelling of the face, lips, or tongue -dizziness -fever -pain, redness, or irritation at site   where injected -pinpoint red spots on the skin -red or dark-brown urine -shortness of breath or breathing problems -stomach or side pain, or pain at the shoulder -swelling -tiredness -trouble passing urine or change in the amount of urine Side  effects that usually do not require medical attention (report to your doctor or health care professional if they continue or are bothersome): -bone pain -muscle pain This list may not describe all possible side effects. Call your doctor for medical advice about side effects. You may report side effects to FDA at 1-800-FDA-1088. Where should I keep my medicine? Keep out of the reach of children. Store pre-filled syringes in a refrigerator between 2 and 8 degrees C (36 and 46 degrees F). Do not freeze. Keep in carton to protect from light. Throw away this medicine if it is left out of the refrigerator for more than 48 hours. Throw away any unused medicine after the expiration date. NOTE: This sheet is a summary. It may not cover all possible information. If you have questions about this medicine, talk to your doctor, pharmacist, or health care provider.  2018 Elsevier/Gold Standard (2015-12-16 12:58:03)  

## 2016-10-07 NOTE — ED Triage Notes (Signed)
Pt reports having increasing shortness of breath over the last several days. Pt currently under chemo for Non-Hodgkin's lymphoma. Pt reports last treatment on 10/04/16.

## 2016-10-07 NOTE — ED Provider Notes (Signed)
[facility] DEPT Provider Note   CSN: [id number] Arrival date & time: 10/07/16  2134     History   Chief Complaint Chief Complaint  Patient presents with  . Shortness of Breath    HPI Jo Collins is a 66 y.o. female.  Patient with PMH of non-Hodgkin's lymphoma, right sided pleural effusion, recent admission and thoracentesis presents to the ED with a chief complaint of shortness of breath.  She states that she began having worsening SOB again that started on Thursday.  She reports associated non-productive cough.  She denies any fevers, chills.  She denies any chest pain.  Her SOB is worsened with activity.  She denies any lower extremity swelling.     The history is provided by the patient. No language interpreter was used.    Past Medical History:  Diagnosis Date  . GERD (gastroesophageal reflux disease)   . NHL (non-Hodgkin's lymphoma) [facility])     Patient Active Problem List   Diagnosis Date Noted  . SOB (shortness of breath) 09/26/2016  . Acute respiratory distress 09/25/2016  . Chronic low back pain 09/25/2016  . Hypokalemia 09/25/2016  . Recurrent right pleural effusion 09/25/2016  . DLBCL (diffuse large B cell lymphoma) ([place]) 08/17/2016  . Acute midline thoracic back pain   . Lymphadenopathy   . Morbid obesity with BMI of 50.0-59.9, adult ([place])   . NHL (non-Hodgkin's lymphoma) ([place])   . Abdominal pain, RUQ   . Pleural effusion on right 08/07/2016  . Abnormal EKG 08/07/2016  . 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 Anemia 08/07/2016  . GERD (gastroesophageal reflux disease) 08/07/2016  . Shortness of breath 08/07/2016    Past Surgical History:  Procedure Laterality Date  . ABDOMINAL HYSTERECTOMY    . IR FLUORO GUIDE PORT INSERTION RIGHT  08/18/2016  . IR THORACENTESIS ASP PLEURAL SPACE W/IMG GUIDE  08/07/2016  . IR THORACENTESIS ASP PLEURAL SPACE W/IMG GUIDE  08/14/2016  . IR US GUIDE VASC ACCESS RIGHT  08/18/2016    OB History    No data available        Home Medications    Prior to Admission medications   Medication Sig Start Date End Date Taking? Authorizing Provider  acetaminophen (TYLENOL) 500 MG tablet Take 1,000 mg by mouth every 6 (six) hours as needed for moderate pain.   Yes [provider]  benzonatate (TESSALON) 100 MG capsule Take 1 capsule (100 mg total) by mouth 3 (three) times daily as needed for cough. 10/04/16  Yes Brunetta Genera, MD  diphenhydrAMINE (BENADRYL) 25 MG tablet Take 25-50 mg by mouth daily as needed for allergies (chemotherapy infusion).    Yes [provider]  lidocaine-prilocaine (EMLA) cream Apply 1 application topically as needed. Apply to port-a-cath assess site 45-60 mins prior to chemotherapy. 08/22/16  Yes Brunetta Genera, MD  loratadine (CLARITIN) 10 MG tablet Take 10 mg by mouth daily as needed for allergies.   Yes [provider]  Multiple Vitamin (MULTIVITAMIN WITH MINERALS) TABS tablet Take 1 tablet by mouth daily.   Yes [provider]  polyethylene glycol (MIRALAX / GLYCOLAX) packet Take 17 g by mouth daily. 08/15/16  Yes Dhungel, Nishant, MD  predniSONE (DELTASONE) 20 MG tablet Take 3 tablets (60 mg total) by mouth daily. Take on days 1-5 of chemotherapy. 10/04/16  Yes Brunetta Genera, MD  prochlorperazine (COMPAZINE) 10 MG tablet Take 1 tablet (10 mg total) by mouth every 6 (six) hours as needed (Nausea or vomiting). 08/22/16  Yes Irene Limbo,  Cloria Spring, MD  traZODone (DESYREL) 50 MG tablet Take 1 tablet (50 mg total) by mouth at bedtime as needed for sleep. 10/04/16  Yes Brunetta Genera, MD  vitamin B-12 500 MCG tablet Take 1 tablet (500 mcg total) by mouth daily. Patient taking differently: Take 500 mcg by mouth every other day.  08/16/16  Yes Dhungel, Flonnie Overman, MD    Family History Family History  Problem Relation Age of Onset  . COPD Mother   . Diabetes Mellitus II Sister   . Diabetes Mellitus II Brother   . Diabetes Mellitus II Sister    . Stroke Sister   . Hypertension Sister   . Diabetes Mellitus II Sister     Social History Social History  Substance Use Topics  . Smoking status: Former Smoker    Packs/day: 0.50    Years: 47.00    Types: Cigarettes    Quit date: 07/28/2016  . Smokeless tobacco: Never Used  . Alcohol use No     Allergies   Patient has no known allergies.   Review of Systems Review of Systems  All other systems reviewed and are negative.    Physical Exam Updated Vital Signs BP (!) 130/92 (BP Location: Left Arm)   Pulse 87   Temp 98.2 F (36.8 C) (Oral)   Resp (!) 36   Ht 5\' 2"  (1.575 m)   Wt 128.4 kg (283 lb)   SpO2 98%   BMI 51.76 kg/m   Physical Exam  Constitutional: She is oriented to person, place, and time. She appears well-developed and well-nourished.  HENT:  Head: Normocephalic and atraumatic.  Eyes: Pupils are equal, round, and reactive to light. Conjunctivae and EOM are normal.  Neck: Normal range of motion. Neck supple.  Cardiovascular: Normal rate and regular rhythm.  Exam reveals no gallop and no friction rub.   No murmur heard. Pulmonary/Chest: Effort normal and breath sounds normal. No respiratory distress. She has no wheezes. She has no rales. She exhibits no tenderness.  Diminished lung sounds on right  Abdominal: Soft. Bowel sounds are normal. She exhibits no distension and no mass. There is no tenderness. There is no rebound and no guarding.  Musculoskeletal: Normal range of motion. She exhibits no edema or tenderness.  Neurological: She is alert and oriented to person, place, and time.  Skin: Skin is warm and dry.  Psychiatric: She has a normal mood and affect. Her behavior is normal. Judgment and thought content normal.  Nursing note and vitals reviewed.    ED Treatments / Results  Labs (all labs ordered are listed, but only abnormal results are displayed) Labs Reviewed  CBC WITH DIFFERENTIAL/PLATELET - Abnormal; Notable for the following:        Result Value   WBC 53.7 (*)    RBC 3.81 (*)    Hemoglobin 10.8 (*)    HCT 33.1 (*)    RDW 16.1 (*)    Neutro Abs 52.7 (*)    Lymphs Abs 0.5 (*)    All other components within normal limits  BASIC METABOLIC PANEL - Abnormal; Notable for the following:    Glucose, Bld 145 (*)    BUN 21 (*)    GFR calc non Af Amer 59 (*)    All other components within normal limits  BRAIN NATRIURETIC PEPTIDE  I-STAT TROPONIN, ED  POCT I-STAT TROPONIN I    EKG  EKG Interpretation None       Radiology Dg Chest 2 View  Result Date:  10/07/2016 CLINICAL DATA:  Shortness of breath, onset today. Cancer in the spine and esophagus. Recent thoracentesis 1 week ago. Former smoker. EXAM: CHEST  2 VIEW COMPARISON:  09/27/2016 FINDINGS: Large right pleural effusion, increased since previous study. Atelectasis or consolidation in the right lung base. Left lung is clear. Heart size and pulmonary vascularity are normal. Right central venous catheter with tip over the mid SVC region. No pneumothorax. Degenerative changes in the spine. IMPRESSION: Large right pleural effusion increased since previous study. Consolidation or atelectasis in the right lung base. Left lung is clear. Electronically Signed   By: Lucienne Capers M.D.   On: 10/07/2016 22:21    Procedures Procedures (including critical care time)  Medications Ordered in ED Medications  albuterol (PROVENTIL) (2.5 MG/3ML) 0.083% nebulizer solution 5 mg (not administered)     Initial Impression / Assessment and Plan / ED Course  I have reviewed the triage vital signs and the nursing notes.  Pertinent labs & imaging results that were available during my care of the patient were reviewed by me and considered in my medical decision making (see chart for details).     Patient with non-hodgkin's lymphoma.  Presents with SOB.  CXR shows right sided pleural effusion, which has worsened.  Was admitted about 2 weeks ago for the same and had thoracentesis.  She  may require the same.  Will check labs and ambulate with O2 monitor.  Patient feels significantly SOB with ambulation.  WBC is 53.7.  AFTER notifying patient of this, she reports that she got a neulasta shot yesterday.  This likely explains her WBC as she has not evidence of sepsis.  Discussed with Dr. Alcario Drought, who is agreeable with observation for thoracentesis.   Final Clinical Impressions(s) / ED Diagnoses   Final diagnoses:  SOB (shortness of breath)  Pleural effusion    New Prescriptions New Prescriptions   No medications on file     Montine Circle, Hershal Coria 10/08/16 0139    Veryl Speak, MD 10/08/16 [phone]

## 2016-10-08 DIAGNOSIS — D729 Disorder of white blood cells, unspecified: Secondary | ICD-10-CM

## 2016-10-08 DIAGNOSIS — C8338 Diffuse large B-cell lymphoma, lymph nodes of multiple sites: Principal | ICD-10-CM

## 2016-10-08 DIAGNOSIS — J90 Pleural effusion, not elsewhere classified: Secondary | ICD-10-CM | POA: Diagnosis present

## 2016-10-08 DIAGNOSIS — Z87891 Personal history of nicotine dependence: Secondary | ICD-10-CM | POA: Diagnosis not present

## 2016-10-08 DIAGNOSIS — D72828 Other elevated white blood cell count: Secondary | ICD-10-CM

## 2016-10-08 DIAGNOSIS — Z8249 Family history of ischemic heart disease and other diseases of the circulatory system: Secondary | ICD-10-CM | POA: Diagnosis not present

## 2016-10-08 DIAGNOSIS — R05 Cough: Secondary | ICD-10-CM | POA: Diagnosis not present

## 2016-10-08 DIAGNOSIS — R0602 Shortness of breath: Secondary | ICD-10-CM

## 2016-10-08 DIAGNOSIS — J910 Malignant pleural effusion: Secondary | ICD-10-CM | POA: Diagnosis present

## 2016-10-08 DIAGNOSIS — Z90710 Acquired absence of both cervix and uterus: Secondary | ICD-10-CM | POA: Diagnosis not present

## 2016-10-08 DIAGNOSIS — Z9221 Personal history of antineoplastic chemotherapy: Secondary | ICD-10-CM | POA: Diagnosis not present

## 2016-10-08 DIAGNOSIS — Z823 Family history of stroke: Secondary | ICD-10-CM | POA: Diagnosis not present

## 2016-10-08 DIAGNOSIS — Z9889 Other specified postprocedural states: Secondary | ICD-10-CM | POA: Diagnosis not present

## 2016-10-08 DIAGNOSIS — T380X5A Adverse effect of glucocorticoids and synthetic analogues, initial encounter: Secondary | ICD-10-CM | POA: Diagnosis present

## 2016-10-08 DIAGNOSIS — K219 Gastro-esophageal reflux disease without esophagitis: Secondary | ICD-10-CM | POA: Diagnosis present

## 2016-10-08 DIAGNOSIS — Z833 Family history of diabetes mellitus: Secondary | ICD-10-CM | POA: Diagnosis not present

## 2016-10-08 DIAGNOSIS — T458X5A Adverse effect of other primarily systemic and hematological agents, initial encounter: Secondary | ICD-10-CM | POA: Diagnosis present

## 2016-10-08 HISTORY — DX: Disorder of white blood cells, unspecified: D72.9

## 2016-10-08 HISTORY — DX: Other elevated white blood cell count: D72.828

## 2016-10-08 HISTORY — DX: Pleural effusion, not elsewhere classified: J90

## 2016-10-08 LAB — CBC
HEMATOCRIT: 30.3 % — AB (ref 36.0–46.0)
Hemoglobin: 9.9 g/dL — ABNORMAL LOW (ref 12.0–15.0)
MCH: 28.2 pg (ref 26.0–34.0)
MCHC: 32.7 g/dL (ref 30.0–36.0)
MCV: 86.3 fL (ref 78.0–100.0)
Platelets: 207 10*3/uL (ref 150–400)
RBC: 3.51 MIL/uL — ABNORMAL LOW (ref 3.87–5.11)
RDW: 16.1 % — AB (ref 11.5–15.5)
WBC: 51.3 10*3/uL — AB (ref 4.0–10.5)

## 2016-10-08 LAB — URINALYSIS, ROUTINE W REFLEX MICROSCOPIC
BILIRUBIN URINE: NEGATIVE
Glucose, UA: NEGATIVE mg/dL
Hgb urine dipstick: NEGATIVE
KETONES UR: NEGATIVE mg/dL
Leukocytes, UA: NEGATIVE
NITRITE: NEGATIVE
Protein, ur: 30 mg/dL — AB
SPECIFIC GRAVITY, URINE: 1.029 (ref 1.005–1.030)
pH: 5 (ref 5.0–8.0)

## 2016-10-08 LAB — BASIC METABOLIC PANEL
ANION GAP: 11 (ref 5–15)
BUN: 21 mg/dL — ABNORMAL HIGH (ref 6–20)
CALCIUM: 9.1 mg/dL (ref 8.9–10.3)
CO2: 24 mmol/L (ref 22–32)
CREATININE: 0.98 mg/dL (ref 0.44–1.00)
Chloride: 106 mmol/L (ref 101–111)
GFR calc non Af Amer: 59 mL/min — ABNORMAL LOW (ref >60)
Glucose, Bld: 145 mg/dL — ABNORMAL HIGH (ref 65–99)
Potassium: 3.8 mmol/L (ref 3.5–5.1)
SODIUM: 141 mmol/L (ref 135–145)

## 2016-10-08 LAB — CBC WITH DIFFERENTIAL/PLATELET
BASOS ABS: 0 10*3/uL (ref 0.0–0.1)
Basophils Relative: 0 %
EOS PCT: 0 %
Eosinophils Absolute: 0 10*3/uL (ref 0.0–0.7)
HEMATOCRIT: 33.1 % — AB (ref 36.0–46.0)
HEMOGLOBIN: 10.8 g/dL — AB (ref 12.0–15.0)
Lymphocytes Relative: 1 %
Lymphs Abs: 0.5 10*3/uL — ABNORMAL LOW (ref 0.7–4.0)
MCH: 28.3 pg (ref 26.0–34.0)
MCHC: 32.6 g/dL (ref 30.0–36.0)
MCV: 86.9 fL (ref 78.0–100.0)
MONO ABS: 0.5 10*3/uL (ref 0.1–1.0)
MONOS PCT: 1 %
NEUTROS PCT: 98 %
Neutro Abs: 52.7 10*3/uL — ABNORMAL HIGH (ref 1.7–7.7)
Platelets: 264 10*3/uL (ref 150–400)
RBC: 3.81 MIL/uL — AB (ref 3.87–5.11)
RDW: 16.1 % — AB (ref 11.5–15.5)
WBC Morphology: INCREASED
WBC: 53.7 10*3/uL — AB (ref 4.0–10.5)

## 2016-10-08 LAB — COMPREHENSIVE METABOLIC PANEL
ALBUMIN: 3.3 g/dL — AB (ref 3.5–5.0)
ALT: 33 U/L (ref 14–54)
ANION GAP: 11 (ref 5–15)
AST: 32 U/L (ref 15–41)
Alkaline Phosphatase: 47 U/L (ref 38–126)
BUN: 20 mg/dL (ref 6–20)
CHLORIDE: 105 mmol/L (ref 101–111)
CO2: 24 mmol/L (ref 22–32)
Calcium: 9 mg/dL (ref 8.9–10.3)
Creatinine, Ser: 0.91 mg/dL (ref 0.44–1.00)
GFR calc Af Amer: >60 mL/min (ref >60)
GFR calc non Af Amer: >60 mL/min (ref >60)
GLUCOSE: 149 mg/dL — AB (ref 65–99)
POTASSIUM: 3.2 mmol/L — AB (ref 3.5–5.1)
SODIUM: 140 mmol/L (ref 135–145)
Total Bilirubin: 0.6 mg/dL (ref 0.3–1.2)
Total Protein: 6.3 g/dL — ABNORMAL LOW (ref 6.5–8.1)

## 2016-10-08 LAB — LACTATE DEHYDROGENASE: LDH: 219 U/L — ABNORMAL HIGH (ref 98–192)

## 2016-10-08 LAB — MAGNESIUM: Magnesium: 1.9 mg/dL (ref 1.7–2.4)

## 2016-10-08 LAB — BRAIN NATRIURETIC PEPTIDE: B NATRIURETIC PEPTIDE 5: 75.9 pg/mL (ref 0.0–100.0)

## 2016-10-08 MED ORDER — CYANOCOBALAMIN 500 MCG PO TABS
500.0000 ug | ORAL_TABLET | ORAL | Status: DC
  Administered 2016-10-08: 500 ug via ORAL
  Filled 2016-10-08 (×2): qty 1

## 2016-10-08 MED ORDER — PROCHLORPERAZINE MALEATE 10 MG PO TABS: 10.0000 mg | ORAL_TABLET | Freq: Four times a day (QID) | ORAL | Status: DC | PRN

## 2016-10-08 MED ORDER — HYDROCODONE-HOMATROPINE 5-1.5 MG/5ML PO SYRP
5.0000 mL | ORAL_SOLUTION | Freq: Three times a day (TID) | ORAL | Status: DC
  Administered 2016-10-08 – 2016-10-10 (×6): 5 mL via ORAL
  Filled 2016-10-08 (×7): qty 5

## 2016-10-08 MED ORDER — HYDROCODONE-HOMATROPINE 5-1.5 MG/5ML PO SYRP: 5.0000 mL | ORAL_SOLUTION | ORAL | Status: DC | PRN

## 2016-10-08 MED ORDER — PREDNISONE 20 MG PO TABS
60.0000 mg | ORAL_TABLET | Freq: Every day | ORAL | Status: DC
  Administered 2016-10-08: 60 mg via ORAL
  Filled 2016-10-08 (×3): qty 3

## 2016-10-08 MED ORDER — ACETAMINOPHEN 650 MG RE SUPP: 650.0000 mg | Freq: Four times a day (QID) | RECTAL | Status: DC | PRN

## 2016-10-08 MED ORDER — ONDANSETRON HCL 4 MG/2ML IJ SOLN
4.0000 mg | Freq: Four times a day (QID) | INTRAMUSCULAR | Status: DC | PRN
  Administered 2016-10-08: 4 mg via INTRAVENOUS
  Filled 2016-10-08: qty 2

## 2016-10-08 MED ORDER — ONDANSETRON HCL 4 MG PO TABS: 4.0000 mg | ORAL_TABLET | Freq: Four times a day (QID) | ORAL | Status: DC | PRN

## 2016-10-08 MED ORDER — ORAL CARE MOUTH RINSE
15.0000 mL | Freq: Two times a day (BID) | OROMUCOSAL | Status: DC
  Administered 2016-10-08 – 2016-10-10 (×5): 15 mL via OROMUCOSAL

## 2016-10-08 MED ORDER — BENZONATATE 100 MG PO CAPS
200.0000 mg | ORAL_CAPSULE | Freq: Three times a day (TID) | ORAL | Status: DC
  Administered 2016-10-08 – 2016-10-10 (×7): 200 mg via ORAL
  Filled 2016-10-08 (×7): qty 2

## 2016-10-08 MED ORDER — ENOXAPARIN SODIUM 40 MG/0.4ML ~~LOC~~ SOLN
40.0000 mg | SUBCUTANEOUS | Status: DC
  Administered 2016-10-08 – 2016-10-10 (×3): 40 mg via SUBCUTANEOUS
  Filled 2016-10-08 (×3): qty 0.4

## 2016-10-08 MED ORDER — POTASSIUM CHLORIDE CRYS ER 20 MEQ PO TBCR
40.0000 meq | EXTENDED_RELEASE_TABLET | ORAL | Status: AC
  Administered 2016-10-08 (×2): 40 meq via ORAL
  Filled 2016-10-08 (×2): qty 2

## 2016-10-08 MED ORDER — ADULT MULTIVITAMIN W/MINERALS CH
1.0000 | ORAL_TABLET | Freq: Every day | ORAL | Status: DC
  Administered 2016-10-08 – 2016-10-10 (×3): 1 via ORAL
  Filled 2016-10-08 (×3): qty 1

## 2016-10-08 MED ORDER — DIPHENHYDRAMINE HCL 25 MG PO CAPS: 25.0000 mg | ORAL_CAPSULE | Freq: Every day | ORAL | Status: DC | PRN

## 2016-10-08 MED ORDER — TRAZODONE HCL 50 MG PO TABS
50.0000 mg | ORAL_TABLET | Freq: Every evening | ORAL | Status: DC | PRN
  Administered 2016-10-08 – 2016-10-09 (×2): 50 mg via ORAL
  Filled 2016-10-08 (×2): qty 1

## 2016-10-08 MED ORDER — POLYETHYLENE GLYCOL 3350 17 G PO PACK
17.0000 g | PACK | Freq: Every day | ORAL | Status: DC
Start: 2016-10-08 — End: 2016-10-10
  Administered 2016-10-09 – 2016-10-10 (×2): 17 g via ORAL
  Filled 2016-10-08 (×3): qty 1

## 2016-10-08 MED ORDER — BENZONATATE 100 MG PO CAPS
100.0000 mg | ORAL_CAPSULE | Freq: Three times a day (TID) | ORAL | Status: DC | PRN
Start: 2016-10-08 — End: 2016-10-08

## 2016-10-08 MED ORDER — ACETAMINOPHEN 500 MG PO TABS: 1000.0000 mg | ORAL_TABLET | Freq: Four times a day (QID) | ORAL | Status: DC | PRN

## 2016-10-08 MED ORDER — LORATADINE 10 MG PO TABS
10.0000 mg | ORAL_TABLET | Freq: Every day | ORAL | Status: DC | PRN
  Administered 2016-10-08: 10 mg via ORAL
  Filled 2016-10-08 (×2): qty 1

## 2016-10-08 MED ORDER — ACETAMINOPHEN 325 MG PO TABS: 650.0000 mg | ORAL_TABLET | Freq: Four times a day (QID) | ORAL | Status: DC | PRN

## 2016-10-08 NOTE — Progress Notes (Signed)
I have seen and assessed patient and agree with Dr. Juleen China assessment and plan. Patient is a 66 year old female history of DLBCL on chemotherapy last session 10/05/2016, received Neulasta on 10/07/2016 presented with worsening shortness of breath on minimal ambulation noted to be secondary to recurrent right sided pleural effusion. Patient for ultrasound-guided diagnostic and therapeutic thoracentesis to be done 10/09/2016. Patient noted to have a significant leukocytosis which may likely be secondary to patient's DLBCL in the setting of recent Neulasta. Will check a UA with cultures and sensitivities. Check a chest x-ray. Check blood cultures 2 to rule out any infectious etiology. Hold off on antibiotics at this time.  No charge.

## 2016-10-08 NOTE — H&P (Addendum)
History and Physical    Jo Collins PZW:[id number] DOB: 06-07-1950 DOA: 10/07/2016  PCP: Deland Pretty, MD  Patient coming from: Home  I have personally briefly reviewed patient's old medical records in [place]  Chief Complaint: SOB  HPI: Jo Collins is a 66 y.o. female with medical history significant of DLBCL, chemo on 10/4, Neulasta on 10/6.  Recurrent R pleural effusion, likely reactive to the DLBCL mass in chest although they didn't see any malignant cells last time they drained it, just T cells.  Pleural effusion last drained partially on 9/25.  Patient presents to the ED with SOB, worse with exertion, better at rest.  Ongoing associated cough, non-productive.  No fevers, no chills, no LE edema.  Symptoms are essentially identical to before she had effusion drained last time on 9/25, not any worse than prior to drainage last time.   ED Course: R pleural effusion is back, demonstrated on CXR.  She does have a WBC of 53.7k with left shift.  No fevers.   Review of Systems: As per HPI otherwise 10 point review of systems negative.   Past Medical History:  Diagnosis Date  . GERD (gastroesophageal reflux disease)   . NHL (non-Hodgkin's lymphoma) [facility])     Past Surgical History:  Procedure Laterality Date  . ABDOMINAL HYSTERECTOMY    . IR FLUORO GUIDE PORT INSERTION RIGHT  08/18/2016  . IR THORACENTESIS ASP PLEURAL SPACE W/IMG GUIDE  08/07/2016  . IR THORACENTESIS ASP PLEURAL SPACE W/IMG GUIDE  08/14/2016  . IR US GUIDE VASC ACCESS RIGHT  08/18/2016     reports that she quit smoking about 2 months ago. Her smoking use included Cigarettes. She has a 23.50 pack-year smoking history. She has never used smokeless tobacco. She reports that she does not drink alcohol or use drugs.  No Known Allergies  Family History  Problem Relation Age of Onset  . COPD Mother   . Diabetes Mellitus II Sister   . Diabetes Mellitus II Brother   . Diabetes Mellitus II Sister   . Stroke  Sister   . Hypertension Sister   . Diabetes Mellitus II Sister      Prior to Admission medications   Medication Sig Start Date End Date Taking? Authorizing Provider  acetaminophen (TYLENOL) 500 MG tablet Take 1,000 mg by mouth every 6 (six) hours as needed for moderate pain.   Yes [provider]  benzonatate (TESSALON) 100 MG capsule Take 1 capsule (100 mg total) by mouth 3 (three) times daily as needed for cough. 10/04/16  Yes Brunetta Genera, MD  diphenhydrAMINE (BENADRYL) 25 MG tablet Take 25-50 mg by mouth daily as needed for allergies (chemotherapy infusion).    Yes [provider]  lidocaine-prilocaine (EMLA) cream Apply 1 application topically as needed. Apply to port-a-cath assess site 45-60 mins prior to chemotherapy. 08/22/16  Yes Brunetta Genera, MD  loratadine (CLARITIN) 10 MG tablet Take 10 mg by mouth daily as needed for allergies.   Yes [provider]  Multiple Vitamin (MULTIVITAMIN WITH MINERALS) TABS tablet Take 1 tablet by mouth daily.   Yes [provider]  polyethylene glycol (MIRALAX / GLYCOLAX) packet Take 17 g by mouth daily. 08/15/16  Yes Dhungel, Nishant, MD  predniSONE (DELTASONE) 20 MG tablet Take 3 tablets (60 mg total) by mouth daily. Take on days 1-5 of chemotherapy. 10/04/16  Yes Brunetta Genera, MD  prochlorperazine (COMPAZINE) 10 MG tablet Take 1 tablet (10 mg total) by mouth every 6 (  six) hours as needed (Nausea or vomiting). 08/22/16  Yes Brunetta Genera, MD  traZODone (DESYREL) 50 MG tablet Take 1 tablet (50 mg total) by mouth at bedtime as needed for sleep. 10/04/16  Yes Brunetta Genera, MD  vitamin B-12 500 MCG tablet Take 1 tablet (500 mcg total) by mouth daily. Patient taking differently: Take 500 mcg by mouth every other day.  08/16/16  Yes Dhungel, Flonnie Overman, MD    Physical Exam: Vitals:   10/07/16 2157 10/07/16 2230 10/07/16 2300 10/08/16 0007  BP:  138/78 (!) 146/69 139/77  Pulse:  89 80 84    Resp:  (!) 25 (!) 32 (!) 23  Temp:    98.4 F (36.9 C)  TempSrc:    Oral  SpO2:  96% 92% 98%  Weight: 128.4 kg (283 lb)     Height: 5\' 2"  (1.575 m)       Constitutional: NAD, calm, comfortable Eyes: PERRL, lids and conjunctivae normal ENMT: Mucous membranes are moist. Posterior pharynx clear of any exudate or lesions.Normal dentition.  Neck: normal, supple, no masses, no thyromegaly Respiratory: Absent over R lower lung fields, tachypnea with movement Cardiovascular: Mild tachycardia to 100 Abdomen: no tenderness, no masses palpated. No hepatosplenomegaly. Bowel sounds positive.  Musculoskeletal: no clubbing / cyanosis. No joint deformity upper and lower extremities. Good ROM, no contractures. Normal muscle tone.  Skin: no rashes, lesions, ulcers. No induration Neurologic: CN 2-12 grossly intact. Sensation intact, DTR normal. Strength 5/5 in all 4.  Psychiatric: Normal judgment and insight. Alert and oriented x 3. Normal mood.    Labs on Admission: I have personally reviewed following labs and imaging studies  CBC:  Recent Labs Lab 10/04/16 0810 10/05/16 1710 10/07/16 2248  WBC 7.9 17.2* 53.7*  NEUTROABS 6.1 15.7* 52.7*  HGB 11.3* 10.8* 10.8*  HCT 36.0 32.9* 33.1*  MCV 88.0 84.6 86.9  PLT 327 290 353   Basic Metabolic Panel:  Recent Labs Lab 10/04/16 0810 10/05/16 1710 10/07/16 2248  NA 142 141 141  K 4.1 3.4* 3.8  CL  --  109 106  CO2 24 23 24   GLUCOSE 103 171* 145*  BUN 13.0 14 21*  CREATININE 0.9 0.79 0.98  CALCIUM 9.3 9.2 9.1   GFR: Estimated Creatinine Clearance: 73.5 mL/min (by C-G formula based on SCr of 0.98 mg/dL). Liver Function Tests:  Recent Labs Lab 10/04/16 0810  AST 21  ALT 20  ALKPHOS 51  BILITOT 0.31  PROT 6.6  ALBUMIN 3.0*   No results for input(s): LIPASE, AMYLASE in the last 168 hours. No results for input(s): AMMONIA in the last 168 hours. Coagulation Profile: No results for input(s): INR, PROTIME in the last 168  hours. Cardiac Enzymes: No results for input(s): CKTOTAL, CKMB, CKMBINDEX, TROPONINI in the last 168 hours. BNP (last 3 results) No results for input(s): PROBNP in the last 8760 hours. HbA1C: No results for input(s): HGBA1C in the last 72 hours. CBG: No results for input(s): GLUCAP in the last 168 hours. Lipid Profile: No results for input(s): CHOL, HDL, LDLCALC, TRIG, CHOLHDL, LDLDIRECT in the last 72 hours. Thyroid Function Tests: No results for input(s): TSH, T4TOTAL, FREET4, T3FREE, THYROIDAB in the last 72 hours. Anemia Panel: No results for input(s): VITAMINB12, FOLATE, FERRITIN, TIBC, IRON, RETICCTPCT in the last 72 hours. Urine analysis:    Component Value Date/Time   COLORURINE AMBER (A) 09/25/2016 1807   APPEARANCEUR CLOUDY (A) 09/25/2016 1807   LABSPEC 1.026 09/25/2016 1807   PHURINE 5.0 09/25/2016 1807  GLUCOSEU NEGATIVE 09/25/2016 1807   HGBUR NEGATIVE 09/25/2016 1807   BILIRUBINUR NEGATIVE 09/25/2016 [place] 09/25/2016 1807   PROTEINUR 30 (A) 09/25/2016 1807   NITRITE NEGATIVE 09/25/2016 1807   LEUKOCYTESUR NEGATIVE 09/25/2016 1807    Radiological Exams on Admission: Dg Chest 2 View  Result Date: 10/07/2016 CLINICAL DATA:  Shortness of breath, onset today. Cancer in the spine and esophagus. Recent thoracentesis 1 week ago. Former smoker. EXAM: CHEST  2 VIEW COMPARISON:  09/27/2016 FINDINGS: Large right pleural effusion, increased since previous study. Atelectasis or consolidation in the right lung base. Left lung is clear. Heart size and pulmonary vascularity are normal. Right central venous catheter with tip over the mid SVC region. No pneumothorax. Degenerative changes in the spine. IMPRESSION: Large right pleural effusion increased since previous study. Consolidation or atelectasis in the right lung base. Left lung is clear. Electronically Signed   By: Lucienne Capers M.D.   On: 10/07/2016 22:21    EKG: Independently  reviewed.  Assessment/Plan Principal Problem:   Recurrent right pleural effusion Active Problems:   Shortness of breath   DLBCL (diffuse large B cell lymphoma) (HCC)   Neutrophilic leukocytosis    1. Recurrent R pleural effusion - causing SOB, tachypnea, mild tachycardia 1. Plan IR drainage tomorrow 2. Neutrophilic leukocytosis - 1. Highly suspect that this is just due to Neulasta + the prednisone she is on as this would be the expected response from functioning bone-marrow. 2. Observe for signs of fever, other sites of infection. 3. But will hold off on ABx for now given that I believe her SOB, tachypnea, tachycardia is due to the large pleural effusion that is taking up over half of her lung space as above. 4. Will get WBC repeat in AM to make sure this isnt going even higher. 3. DLBCL - 1. Chemo patient of Dr. Irene Limbo, will put IP consult order into epic as per routine.  DVT prophylaxis: Lovenox Code Status: Full Family Communication: Family at bedside Disposition Plan: Home after admit Consults called: None Admission status: Place in [place], [place] [facility] Pager [phone]  If 7AM-7PM, please contact day team taking care of patient [email or web address] Password [email or web address]  10/08/2016, 2:09 AM

## 2016-10-09 ENCOUNTER — Inpatient Hospital Stay (HOSPITAL_COMMUNITY): Payer: Medicare Other

## 2016-10-09 ENCOUNTER — Ambulatory Visit: Payer: Medicare Other | Admitting: Physical Therapy

## 2016-10-09 ENCOUNTER — Other Ambulatory Visit: Payer: Self-pay | Admitting: Hematology

## 2016-10-09 DIAGNOSIS — R05 Cough: Secondary | ICD-10-CM

## 2016-10-09 DIAGNOSIS — R059 Cough, unspecified: Secondary | ICD-10-CM

## 2016-10-09 LAB — BODY FLUID CELL COUNT WITH DIFFERENTIAL
Lymphs, Fluid: 91 %
Neutrophil Count, Fluid: 9 % (ref 0–25)
WBC FLUID: 1288 uL — AB (ref 0–1000)

## 2016-10-09 LAB — CBC WITH DIFFERENTIAL/PLATELET
BASOS ABS: 0 10*3/uL (ref 0.0–0.1)
Basophils Relative: 0 %
EOS ABS: 0 10*3/uL (ref 0.0–0.7)
Eosinophils Relative: 0 %
HCT: 30.5 % — ABNORMAL LOW (ref 36.0–46.0)
Hemoglobin: 9.8 g/dL — ABNORMAL LOW (ref 12.0–15.0)
Lymphocytes Relative: 2 %
Lymphs Abs: 0.8 10*3/uL (ref 0.7–4.0)
MCH: 28.1 pg (ref 26.0–34.0)
MCHC: 32.1 g/dL (ref 30.0–36.0)
MCV: 87.4 fL (ref 78.0–100.0)
MONO ABS: 0.4 10*3/uL (ref 0.1–1.0)
Monocytes Relative: 1 %
NEUTROS PCT: 97 %
Neutro Abs: 39.2 10*3/uL — ABNORMAL HIGH (ref 1.7–7.7)
PLATELETS: 180 10*3/uL (ref 150–400)
RBC: 3.49 MIL/uL — AB (ref 3.87–5.11)
RDW: 16.4 % — ABNORMAL HIGH (ref 11.5–15.5)
WBC: 40.4 10*3/uL — AB (ref 4.0–10.5)

## 2016-10-09 LAB — COMPREHENSIVE METABOLIC PANEL
ALBUMIN: 3.1 g/dL — AB (ref 3.5–5.0)
ALT: 38 U/L (ref 14–54)
AST: 29 U/L (ref 15–41)
Alkaline Phosphatase: 54 U/L (ref 38–126)
Anion gap: 8 (ref 5–15)
BUN: 20 mg/dL (ref 6–20)
CHLORIDE: 106 mmol/L (ref 101–111)
CO2: 26 mmol/L (ref 22–32)
CREATININE: 0.79 mg/dL (ref 0.44–1.00)
Calcium: 8.9 mg/dL (ref 8.9–10.3)
GFR calc non Af Amer: >60 mL/min (ref >60)
Glucose, Bld: 103 mg/dL — ABNORMAL HIGH (ref 65–99)
Potassium: 3.9 mmol/L (ref 3.5–5.1)
SODIUM: 140 mmol/L (ref 135–145)
Total Bilirubin: 0.6 mg/dL (ref 0.3–1.2)
Total Protein: 6 g/dL — ABNORMAL LOW (ref 6.5–8.1)

## 2016-10-09 LAB — URINE CULTURE

## 2016-10-09 LAB — ALBUMIN, PLEURAL OR PERITONEAL FLUID: Albumin, Fluid: 2.1 g/dL

## 2016-10-09 LAB — PROTEIN, PLEURAL OR PERITONEAL FLUID: Total protein, fluid: UNDETERMINED g/dL

## 2016-10-09 LAB — GLUCOSE, PLEURAL OR PERITONEAL FLUID: GLUCOSE FL: 107 mg/dL

## 2016-10-09 LAB — LACTATE DEHYDROGENASE, PLEURAL OR PERITONEAL FLUID: LD, Fluid: 115 U/L — ABNORMAL HIGH (ref 3–23)

## 2016-10-09 MED ORDER — FUROSEMIDE 20 MG PO TABS
20.0000 mg | ORAL_TABLET | Freq: Every day | ORAL | Status: DC
  Administered 2016-10-09 – 2016-10-10 (×2): 20 mg via ORAL
  Filled 2016-10-09 (×2): qty 1

## 2016-10-09 MED ORDER — LIDOCAINE HCL 2 % IJ SOLN
INTRAMUSCULAR | Status: AC
  Filled 2016-10-09: qty 10

## 2016-10-09 NOTE — Progress Notes (Signed)
PROGRESS NOTE    Amyre Segundo  BJS:[id number] DOB: 10/24/1950 DOA: 10/07/2016 PCP: Deland Pretty, MD    Brief Narrative:   Remas Sobel is a 66 y.o. female with medical history significant of DLBCL, chemo on 10/4, Neulasta on 10/6.  Recurrent R pleural effusion, likely reactive to the DLBCL mass in chest although they didn't see any malignant cells last time they drained it, just T cells.  Pleural effusion last drained partially on 9/25.  Patient presents to the ED with SOB, worse with exertion, better at rest.  Ongoing associated cough, non-productive.  No fevers, no chills, no LE edema.  Symptoms are essentially identical to before she had effusion drained last time on 9/25, not any worse than prior to drainage last time.   ED Course: R pleural effusion is back, demonstrated on CXR.  She does have a WBC of 53.7k with left shift.  No fevers.    Assessment & Plan:   Principal Problem:   Recurrent right pleural effusion Active Problems:   Shortness of breath   DLBCL (diffuse large B cell lymphoma) (HCC)   Neutrophilic leukocytosis   Recurrent pleural effusion on right  #1 recurrent right pleural effusion Patient presented with shortness of breath, tachypnea, tachycardia secondary to recurrent right pleural effusion. Flow cytometry from prior thoracenteses 09/26/2016 with T cells only. It was felt per oncology the patient's recurrent right pleural effusion likely related to her lymphoma possibly from decreased lymphatic drainage. 1.2 L of pleural fluid was removed during last hospitalization. Patient for diagnostic and therapeutic thoracentesis today per IR. Will also consult with oncology for further evaluation and management.?? Evaluation by CT surgery due to recurrent pleural effusion however will defer to oncology.  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 2-D echo with EF of  60-65%. Will consult with oncology for further evaluation. Been managed in the outpatient setting by Dr. Irene Limbo of hematology oncology.  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 Symptomatic treatment.  #4 neutrophilic leukocytosis Likely secondary to recently received Neulasta and chronic prednisone. Patient afebrile. WBC trending down. Urinalysis unremarkable. Chest x-ray with recurrent right pleural effusion however negative for any acute infiltrate. Blood cultures pending with no growth to date. Urine cultures pending. No need for antibiotics at this time. Follow.  #5 cough May be secondary up her respiratory symptoms as patient reported prior history of sinus drainage. Hycodan was scheduled in addition to scheduled Tessalon Perles with some symptomatic improvement. Follow.  DVT prophylaxis: Lovenox Code Status: Full Family Communication: Updated patient. No family at bedside. Disposition Plan: Likely home when medically stable and improved and per oncology.   Consultants:   Oncology notified via Epic  Procedures:  Diagnostic and therapeutic paracenteses pending 10/09/2016  Antimicrobials:   None   Subjective: Patient still with c/o SOB on minimal exertion. Some improvement in cough. No chest pain.  Objective: Vitals:   10/08/16 1408 10/08/16 2109 10/09/16 0443 10/09/16 0927  BP: 114/77 (!) 131/92 137/76 115/64  Pulse: 100 87 83   Resp: 18 20 18    Temp: 98.2 F (36.8 C) 99.9 F (37.7 C) 98.6 F (37 C)   TempSrc: Oral Oral Oral   SpO2: 97% 96% 98%   Weight:      Height:        Intake/Output Summary (Last 24 hours) at 10/09/16  0932 Last data filed at 10/09/16 0100  Gross per 24 hour  Intake             1440 ml  Output                0 ml  Net             1440 ml   Filed Weights   10/07/16 2157 10/08/16 0234  Weight: 128.4 kg (283 lb) 128.4 kg (283 lb)     Examination:  General exam: Appears calm and comfortable  Respiratory system: Decreased breath sounds on the right. No wheezing. No crackles.  Cardiovascular system: S1 & S2 heard, RRR. No JVD, murmurs, rubs, gallops or clicks. No pedal edema. Gastrointestinal system: Abdomen is nondistended, soft and nontender. No organomegaly or masses felt. Normal bowel sounds heard. Central nervous system: Alert and oriented. No focal neurological deficits. Extremities: Symmetric 5 x 5 power. Skin: No rashes, lesions or ulcers Psychiatry: Judgement and insight appear normal. Mood & affect appropriate.     Data Reviewed: I have personally reviewed following labs and imaging studies  CBC:  Recent Labs Lab 10/04/16 0810 10/05/16 1710 10/07/16 2248 10/08/16 0345 10/09/16 0402  WBC 7.9 17.2* 53.7* 51.3* 40.4*  NEUTROABS 6.1 15.7* 52.7*  --  39.2*  HGB 11.3* 10.8* 10.8* 9.9* 9.8*  HCT 36.0 32.9* 33.1* 30.3* 30.5*  MCV 88.0 84.6 86.9 86.3 87.4  PLT 327 290 264 207 505   Basic Metabolic Panel:  Recent Labs Lab 10/04/16 0810 10/05/16 1710 10/07/16 2248 10/08/16 1028 10/09/16 0402  NA 142 141 141 140 140  K 4.1 3.4* 3.8 3.2* 3.9  CL  --  109 106 105 106  CO2 24 23 24 24 26   GLUCOSE 103 171* 145* 149* 103*  BUN 13.0 14 21* 20 20  CREATININE 0.9 0.79 0.98 0.91 0.79  CALCIUM 9.3 9.2 9.1 9.0 8.9  MG  --   --   --  1.9  --    GFR: Estimated Creatinine Clearance: 90.1 mL/min (by C-G formula based on SCr of 0.79 mg/dL). Liver Function Tests:  Recent Labs Lab 10/04/16 0810 10/08/16 1028 10/09/16 0402  AST 21 32 29  ALT 20 33 38  ALKPHOS 51 47 54  BILITOT 0.31 0.6 0.6  PROT 6.6 6.3* 6.0*  ALBUMIN 3.0* 3.3* 3.1*   No results for input(s): LIPASE, AMYLASE in the last 168 hours. No results for input(s): AMMONIA in the last 168 hours. Coagulation Profile: No results for input(s): INR, PROTIME in the last 168 hours. Cardiac Enzymes: No results for input(s): CKTOTAL, CKMB,  CKMBINDEX, TROPONINI in the last 168 hours. BNP (last 3 results) No results for input(s): PROBNP in the last 8760 hours. HbA1C: No results for input(s): HGBA1C in the last 72 hours. CBG: No results for input(s): GLUCAP in the last 168 hours. Lipid Profile: No results for input(s): CHOL, HDL, LDLCALC, TRIG, CHOLHDL, LDLDIRECT in the last 72 hours. Thyroid Function Tests: No results for input(s): TSH, T4TOTAL, FREET4, T3FREE, THYROIDAB in the last 72 hours. Anemia Panel: No results for input(s): VITAMINB12, FOLATE, FERRITIN, TIBC, IRON, RETICCTPCT in the last 72 hours. Sepsis Labs: No results for input(s): PROCALCITON, LATICACIDVEN in the last 168 hours.  No results found for this or any previous visit (from the past 240 hour(s)).       Radiology Studies: Dg Chest 2 View  Result Date: 10/07/2016 CLINICAL DATA:  Shortness of breath, onset today. Cancer in the spine and esophagus. Recent  thoracentesis 1 week ago. Former smoker. EXAM: CHEST  2 VIEW COMPARISON:  09/27/2016 FINDINGS: Large right pleural effusion, increased since previous study. Atelectasis or consolidation in the right lung base. Left lung is clear. Heart size and pulmonary vascularity are normal. Right central venous catheter with tip over the mid SVC region. No pneumothorax. Degenerative changes in the spine. IMPRESSION: Large right pleural effusion increased since previous study. Consolidation or atelectasis in the right lung base. Left lung is clear. Electronically Signed   By: Lucienne Capers M.D.   On: 10/07/2016 22:21        Scheduled Meds: . benzonatate  200 mg Oral TID  . cyanocobalamin  500 mcg Oral QODAY  . enoxaparin (LOVENOX) injection  40 mg Subcutaneous Q24H  . HYDROcodone-homatropine  5 mL Oral TID  . lidocaine      . mouth rinse  15 mL Mouth Rinse BID  . multivitamin with minerals  1 tablet Oral Daily  . polyethylene glycol  17 g Oral Daily  . predniSONE  60 mg Oral Q breakfast   Continuous  Infusions:   LOS: 1 day    Time spent: 78 mins    THOMPSON,DANIEL, MD [facility] Pager [phone]  If 7PM-7AM, please contact night-coverage [email or web address] Password [email or web address] 10/09/2016, 9:32 AM

## 2016-10-09 NOTE — Procedures (Signed)
Ultrasound-guided diagnostic and therapeutic right thoracentesis performed yielding 1.7 liters of pink chylous colored fluid. No immediate complications. Follow-up chest x-ray pending.  Terminated procedure slightly early secondary to excessive coughing.  Kaylynne Andres E 9:54 AM 10/09/2016

## 2016-10-09 NOTE — Progress Notes (Signed)
Pt returned from having RT Thoracentesis, 1.7 L removed. Bandaid Rt. Upper back has old spot of blood on it. Pt states she's been coughing a lot. Sitting up in chair,family member in room.

## 2016-10-10 ENCOUNTER — Telehealth: Payer: Self-pay

## 2016-10-10 DIAGNOSIS — Z9889 Other specified postprocedural states: Secondary | ICD-10-CM

## 2016-10-10 LAB — BASIC METABOLIC PANEL
Anion gap: 7 (ref 5–15)
BUN: 19 mg/dL (ref 6–20)
CALCIUM: 8.8 mg/dL — AB (ref 8.9–10.3)
CO2: 28 mmol/L (ref 22–32)
CREATININE: 0.83 mg/dL (ref 0.44–1.00)
Chloride: 103 mmol/L (ref 101–111)
GFR calc Af Amer: >60 mL/min (ref >60)
GLUCOSE: 111 mg/dL — AB (ref 65–99)
POTASSIUM: 3.8 mmol/L (ref 3.5–5.1)
Sodium: 138 mmol/L (ref 135–145)

## 2016-10-10 LAB — CBC WITH DIFFERENTIAL/PLATELET
BASOS PCT: 0 %
Basophils Absolute: 0 10*3/uL (ref 0.0–0.1)
EOS PCT: 0 %
Eosinophils Absolute: 0 10*3/uL (ref 0.0–0.7)
HCT: 30.9 % — ABNORMAL LOW (ref 36.0–46.0)
Hemoglobin: 10 g/dL — ABNORMAL LOW (ref 12.0–15.0)
Lymphocytes Relative: 3 %
Lymphs Abs: 0.4 10*3/uL — ABNORMAL LOW (ref 0.7–4.0)
MCH: 28.2 pg (ref 26.0–34.0)
MCHC: 32.4 g/dL (ref 30.0–36.0)
MCV: 87.3 fL (ref 78.0–100.0)
MONO ABS: 0.1 10*3/uL (ref 0.1–1.0)
Monocytes Relative: 1 %
NEUTROS ABS: 14.1 10*3/uL — AB (ref 1.7–7.7)
Neutrophils Relative %: 96 %
PLATELETS: 137 10*3/uL — AB (ref 150–400)
RBC: 3.54 MIL/uL — ABNORMAL LOW (ref 3.87–5.11)
RDW: 16.2 % — AB (ref 11.5–15.5)
WBC: 14.6 10*3/uL — ABNORMAL HIGH (ref 4.0–10.5)

## 2016-10-10 LAB — GRAM STAIN

## 2016-10-10 LAB — PH, BODY FLUID: PH, BODY FLUID: 7.4

## 2016-10-10 MED ORDER — FLUTICASONE PROPIONATE 50 MCG/ACT NA SUSP
2.0000 | Freq: Every day | NASAL | Status: DC
  Filled 2016-10-10: qty 16

## 2016-10-10 MED ORDER — FUROSEMIDE 20 MG PO TABS: 20.0000 mg | ORAL_TABLET | Freq: Every day | ORAL | 1 refills | Status: DC

## 2016-10-10 MED ORDER — HYDROCODONE-HOMATROPINE 5-1.5 MG/5ML PO SYRP: 5.0000 mL | ORAL_SOLUTION | Freq: Three times a day (TID) | ORAL | 0 refills | Status: DC

## 2016-10-10 MED ORDER — FLUTICASONE PROPIONATE 50 MCG/ACT NA SUSP: 2.0000 | Freq: Every day | NASAL | 0 refills | Status: DC

## 2016-10-10 NOTE — Telephone Encounter (Signed)
Sent scheduling message for pt to be added for labs and hospital f/u in a week with Dr. Irene Limbo.

## 2016-10-10 NOTE — Progress Notes (Signed)
Discharge instructions reviewed with patient. Patient verbalized understanding. Patient to be discharged via private vehicle. 

## 2016-10-10 NOTE — Progress Notes (Addendum)
Resting O2 sat 100% HR 93 ambulating O2sat 99% HR 101. Patient denies any SOB or discomfort

## 2016-10-10 NOTE — Discharge Summary (Signed)
Physician Discharge Summary  Jo Collins LPF:[id number] DOB: 16-Sep-1950 DOA: 10/07/2016  PCP: Deland Pretty, MD  Admit date: 10/07/2016 Discharge date: 10/10/2016  Time spent: 65 minutes  Recommendations for Outpatient Follow-up:  1. Follow-up with Dr.Kale, hematology oncology as scheduled on 10/11/2016. On follow-up patient in need a CBC with differential done to follow-up on counts, basic metabolic profile done to follow-up on electrolytes and renal function. 2. Follow-up with Deland Pretty, MD in 2 weeks. On follow-up patient in need a basic metabolic profile done to follow-up on electrolytes and renal function. Patient was started on Lasix 20 mg daily secondary to her recurrent right-sided pleural effusion.   Discharge Diagnoses:  Principal Problem:   Recurrent right pleural effusion Active Problems:   Shortness of breath   DLBCL (diffuse large B cell lymphoma) (HCC)   Neutrophilic leukocytosis   Recurrent pleural effusion on right   Cough   Discharge Condition: Stable and improved  Diet recommendation: Heart healthy  Filed Weights   10/07/16 2157 10/08/16 0234  Weight: 128.4 kg (283 lb) 128.4 kg (283 lb)    History of present illness:  Per Dr. Carylon Perches is a 66 y.o. female with medical history significant of DLBCL, chemo on 10/4, Neulasta on 10/6.  Recurrent R pleural effusion, likely reactive to the DLBCL mass in chest although they didn't see any malignant cells last time they drained it, just T cells.  Pleural effusion last drained partially on 9/25.  Patient presented to the ED with SOB, worse with exertion, better at rest.  Ongoing associated cough, non-productive.  No fevers, no chills, no LE edema.  Symptoms were essentially identical to before she had effusion drained last time on 9/25, not any worse than prior to drainage last time.   ED Course: R pleural effusion is back, demonstrated on CXR.  She does have a WBC of 53.7k with left shift.  No  fevers.  Hospital Course:  #1 recurrent right pleural effusion Patient presented with shortness of breath, tachypnea, tachycardia secondary to recurrent right pleural effusion. Flow cytometry from prior thoracenteses 09/26/2016 with T cells only. It was felt per oncology the patient's recurrent right pleural effusion likely related to her lymphoma possibly from decreased lymphatic drainage. 1.2 L of pleural fluid was removed during last hospitalization. Patient s/p diagnostic and therapeutic thoracentesis 10/09/2016 with 1.7 L removed of pink chylous-colored fluid. Procedure was terminated slightly early secondary to excessive coughing. Postprocedure chest x-ray done showed improvement with recurrent pleural effusion. Patient improved symptomatically. Patient was also started on Lasix 20 mg daily. Outpatient follow-up with oncology.   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 2-D echo with EF of 60-65%. Case was discussed with patient's oncologist Dr. Irene Limbo will be seen the patient in the outpatient setting on 10/11/2016.  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 Symptomatic treatment.  #4 neutrophilic leukocytosis Likely secondary to recently received Neulasta and chronic prednisone. Patient afebrile. WBC trended down. Urinalysis unremarkable. Chest x-ray with recurrent right pleural effusion however negative for any acute infiltrate. Blood cultures were obtained with no growth to date. Pleural fluid Gram stain was negative with preliminary readings, cultures were pending at time of discharge. Pleural fluid was pink with WBC of 1288, 91 lymphocytes, with a neutrophil count of 9. Patient remained afebrile. Leukocytosis trended down such that by  day of discharge white count was 14.6 from 53.7 on  admission. Outpatient follow-up.  #5 cough May be secondary up her respiratory symptoms as patient reported prior history of sinus drainage. Hycodan was scheduled in addition to scheduled Tessalon Perles with some symptomatic improvement. Patient be discharged home on scheduled Hycodan, Flonase, Claritin and Tessalon Perles as needed.   Procedures:  Diagnostic and therapeutic paracenteses pending 10/09/2016  Consultations:  Curb sided hematology/oncology Dr.Kale  Discharge Exam: Vitals:   10/10/16 0700 10/10/16 1341  BP: 125/80 129/85  Pulse: 74 94  Resp: 20 18  Temp: 98.7 F (37.1 C) 98.9 F (37.2 C)  SpO2: 98% 99%    General: NAD Cardiovascular: RRR Respiratory: DECREASED BREATH SOUNDS IN THE RIGHT BASE. No wheezes, no crackles.  Discharge Instructions   Discharge Instructions    Diet - low sodium heart healthy    Complete by:  As directed    Increase activity slowly    Complete by:  As directed      Current Discharge Medication List    START taking these medications   Details  fluticasone (FLONASE) 50 MCG/ACT nasal spray Place 2 sprays into both nostrils daily. Qty: 16 g, Refills: 0    furosemide (LASIX) 20 MG tablet Take 1 tablet (20 mg total) by mouth daily. Qty: 30 tablet, Refills: 1    HYDROcodone-homatropine (HYCODAN) 5-1.5 MG/5ML syrup Take 5 mLs by mouth 3 (three) times daily. Qty: 120 mL, Refills: 0      CONTINUE these medications which have NOT CHANGED   Details  acetaminophen (TYLENOL) 500 MG tablet Take 1,000 mg by mouth every 6 (six) hours as needed for moderate pain.    benzonatate (TESSALON) 100 MG capsule Take 1 capsule (100 mg total) by mouth 3 (three) times daily as needed for cough. Qty: 20 capsule, Refills: 0    diphenhydrAMINE (BENADRYL) 25 MG tablet Take 25-50 mg by mouth daily as needed for allergies (chemotherapy infusion).     lidocaine-prilocaine (EMLA) cream Apply 1 application topically as needed. Apply to port-a-cath assess  site 45-60 mins prior to chemotherapy. Qty: 30 g, Refills: 6    loratadine (CLARITIN) 10 MG tablet Take 10 mg by mouth daily as needed for allergies.    Multiple Vitamin (MULTIVITAMIN WITH MINERALS) TABS tablet Take 1 tablet by mouth daily.    polyethylene glycol (MIRALAX / GLYCOLAX) packet Take 17 g by mouth daily. Qty: 14 each, Refills: 0    predniSONE (DELTASONE) 20 MG tablet Take 3 tablets (60 mg total) by mouth daily. Take on days 1-5 of chemotherapy. Qty: 20 tablet, Refills: 5   Associated Diagnoses: Diffuse large B-cell lymphoma of lymph nodes of multiple regions (HCC)    prochlorperazine (COMPAZINE) 10 MG tablet Take 1 tablet (10 mg total) by mouth every 6 (six) hours as needed (Nausea or vomiting). Qty: 30 tablet, Refills: 6   Associated Diagnoses: Diffuse large B-cell lymphoma of lymph nodes of multiple regions (HCC)    traZODone (DESYREL) 50 MG tablet Take 1 tablet (50 mg total) by mouth at bedtime as needed for sleep. Qty: 30 tablet, Refills: 0    vitamin B-12 500 MCG tablet Take 1 tablet (500 mcg total) by mouth daily. Qty: 30 tablet, Refills: 0       No Known Allergies Follow-up Information    Brunetta Genera, MD Follow up on 10/11/2016.   Specialties:  Hematology, Oncology Why:  f/u as scheduled. Contact information: [place] [address] [phone]  Deland Pretty, MD. Schedule an appointment as soon as possible for a visit in 2 week(s).   Specialty:  Internal Medicine Why:  f/u in 1-2 weeks. Contact information: [address] [place] [address] [phone]            The results of significant diagnostics from this hospitalization (including imaging, microbiology, ancillary and laboratory) are listed below for reference.    Significant Diagnostic Studies: Dg Chest 1 View  Result Date: 10/09/2016 CLINICAL DATA:  Status post right-sided thoracentesis. EXAM: CHEST 1 VIEW COMPARISON:   Radiographs of October 07, 2016. FINDINGS: Stable cardiomediastinal silhouette. Right pleural effusion is significantly smaller status post thoracentesis. No pneumothorax is noted. Left lung is clear. Right internal jugular Port-A-Cath is unchanged in position. Bony thorax is unremarkable. IMPRESSION: Right pleural effusion is significantly smaller status post thoracentesis. No pneumothorax is noted. Electronically Signed   By: Marijo Conception, M.D.   On: 10/09/2016 10:27   Dg Chest 1 View  Result Date: 09/26/2016 CLINICAL DATA:  Right thoracentesis. Shortness of breath and coughing. EXAM: CHEST 1 VIEW COMPARISON:  Ultrasound 09/26/2016.  Chest x-ray 09/25/2016. FINDINGS: PowerPort catheter noted with lead tip projected over the right atrium. Heart size normal. Right base atelectasis. Small moderate right-sided pleural effusion, significantly decreased from prior exam. No pneumothorax. IMPRESSION: No pneumothorax post thoracentesis. Removal of significant amount of right pleural effusion . Persistent right base atelectasis and mild to moderate right pleural effusion . Electronically Signed   By: Marcello Moores  Register   On: 09/26/2016 10:49   Dg Chest 2 View  Result Date: 10/07/2016 CLINICAL DATA:  Shortness of breath, onset today. Cancer in the spine and esophagus. Recent thoracentesis 1 week ago. Former smoker. EXAM: CHEST  2 VIEW COMPARISON:  09/27/2016 FINDINGS: Large right pleural effusion, increased since previous study. Atelectasis or consolidation in the right lung base. Left lung is clear. Heart size and pulmonary vascularity are normal. Right central venous catheter with tip over the mid SVC region. No pneumothorax. Degenerative changes in the spine. IMPRESSION: Large right pleural effusion increased since previous study. Consolidation or atelectasis in the right lung base. Left lung is clear. Electronically Signed   By: Lucienne Capers M.D.   On: 10/07/2016 22:21   Dg Chest 2 View  Result Date:  09/27/2016 CLINICAL DATA:  Shortness of Breath EXAM: CHEST  2 VIEW COMPARISON:  September 26, 2016 FINDINGS: Port-A-Cath tip is in the superior vena cava. No pneumothorax. Sizable partially loculated pleural effusion on the right remains with consolidation in portions of the right middle and lower lobes. Lungs elsewhere are clear. Heart size and pulmonary vascularity are normal. No evident adenopathy. There is degenerative change in the thoracic spine. IMPRESSION: Port-A-Cath tip in superior vena cava.  No pneumothorax. Partially loculated right pleural effusion persists with areas of consolidation in portions of the right middle and lower lobes. No new opacity. Lungs elsewhere clear. Stable cardiac silhouette. Electronically Signed   By: Lowella Grip III M.D.   On: 09/27/2016 08:31   US Thoracentesis Asp Pleural Space W/img Guide  Result Date: 10/09/2016 INDICATION: Patient with a history of non-Hodgkin's lymphoma with recurrent right pleural effusion. Request is made for diagnostic and therapeutic thoracentesis. EXAM: ULTRASOUND GUIDED DIAGNOSTIC AND THERAPEUTIC THORACENTESIS MEDICATIONS: 2% xylocaine COMPLICATIONS: None immediate. PROCEDURE: An ultrasound guided thoracentesis was thoroughly discussed with the patient and questions answered. The benefits, risks, alternatives and complications were also discussed. The patient understands and wishes to proceed with the procedure. Written consent  was obtained. Ultrasound was performed to localize and mark an adequate pocket of fluid in the right chest. The area was then prepped and draped in the normal sterile fashion. 2% xylocaine was used for local anesthesia. Under ultrasound guidance a Safe-T-Centesis catheter was introduced. Thoracentesis was performed. The catheter was removed and a dressing applied. FINDINGS: A total of approximately 1.7 L of pain, chylous colored fluid was removed. Samples were sent to the laboratory as requested by the clinical  team. IMPRESSION: Successful ultrasound guided right thoracentesis yielding 1.7 L of pleural fluid. Procedure terminated slightly early secondary to excessive coughing. Postprocedure chest radiograph shows no pneumothorax. Read by: Saverio Danker, PA-C Electronically Signed   By: Lucrezia Europe M.D.   On: 10/09/2016 11:20   US Thoracentesis Asp Pleural Space W/img Guide  Result Date: 09/26/2016 INDICATION: Patient with history of non-Hodgkin's lymphoma, dyspnea, right pleural effusion. Request made for diagnostic and therapeutic right thoracentesis. EXAM: ULTRASOUND GUIDED DIAGNOSTIC AND THERAPEUTIC RIGHT THORACENTESIS MEDICATIONS: None. COMPLICATIONS: None immediate. PROCEDURE: An ultrasound guided thoracentesis was thoroughly discussed with the patient and questions answered. The benefits, risks, alternatives and complications were also discussed. The patient understands and wishes to proceed with the procedure. Written consent was obtained. Ultrasound was performed to localize and mark an adequate pocket of fluid in the right chest. The area was then prepped and draped in the normal sterile fashion. 2% Lidocaine was used for local anesthesia. Under ultrasound guidance a Safe-T-Centesis catheter was introduced. Thoracentesis was performed. The catheter was removed and a dressing applied. FINDINGS: A total of approximately 1.2 liters of blood-tinged, chylous/milky fluid was removed. Samples were sent to the laboratory as requested by the clinical team. Only the above amount of fluid was removed today secondary to persistent patient coughing. IMPRESSION: Successful ultrasound guided diagnostic and therapeutic right thoracentesis yielding 1.2 of pleural fluid. Read by: Rowe Robert, PA-C Electronically Signed   By: Sandi Mariscal M.D.   On: 09/26/2016 13:36   Dg Fluoro Guided Loc Of Needle/cath Tip For Spinal Inject Lt  Result Date: 10/05/2016 CLINICAL DATA:  DIFFUSE LARGE B-CELL LYMPHOMA EXAM: FLUOROSCOPICALLY  GUIDED LUMBAR PUNCTURE FOR INTRATHECAL CHEMOTHERAPY DIAGNOSTIC LUMBAR PUNCTURE UNDER FLUOROSCOPIC GUIDANCE TECHNIQUE: I discussed the risks (including hemorrhage, infection, headache, and nerve damage, among others), benefits, and alternatives to fluoroscopically guided lumbar puncture with intrathecal methotrexate administration with the patient. We specifically discussed the technical likelihood of success of the procedure. The patient understood and elected to undergo the procedure. Standard time-out was employed. Following sterile skin prep and local anesthetic administration consisting of 1 percent lidocaine, a 22 gauge 5 inch spinal needle was advanced in the midline at L2-3. It might be feasible to use a 4.5 inch needle rather than the 5 inch, but the standard 3.5 inch in length needle would not be adequate due to body habitus. There was a very small midline window that was difficult to access. Accordingly I turned the patient slightly an used a paramedian approach at the same level. The needle was advanced difficulty into the thecal sac. Clear CSF was returned. Opening pressure was not requested or obtained. 12 cc of clear CSF was collected. CSF flow was slow but steady. Slow rate of flow partially attributable to the long needle length and 22 gauge size. Subsequently the medication syringe containing 12 mg of methotrexate was attached and very slowly injected. The patient did note mild left buttock discomfort during injection. I removed the syringe and there was some backflow of medication, suggesting that the  positioning of the needle tip was still intrathecal. Also, the injection was proceeding easily without significant pressure required to inject, also typical of intrathecal positioning. After all of the medication have been administered, the needle was removed and the skin cleansed and bandaged. Aside from the mild left buttock discomfort common no immediate complications were observed. FLUOROSCOPY  TIME:  1 minutes, 16 seconds IMPRESSION: 1. Technically successful fluoroscopically guided lumbar puncture with intrathecal methotrexate administration. 12 cc of clear CSF were obtained for diagnostic purposes. The patient did note mild left buttock discomfort after the exam, we will continue to monitor during her observation. Electronically Signed   By: Van Clines M.D.   On: 10/05/2016 13:37    Microbiology: Recent Results (from the past 240 hour(s))  Culture, blood (Routine X 2) w Reflex to ID Panel     Status: None (Preliminary result)   Collection Time: 10/08/16 10:28 AM  Result Value Ref Range Status   Specimen Description BLOOD RIGHT HAND  Final   Special Requests IN PEDIATRIC BOTTLE Blood Culture adequate volume  Final   Culture   Final    NO GROWTH 1 DAY Performed at [facility], [place] [address], [place], [place] [address]    Report Status PENDING  Incomplete  Culture, blood (Routine X 2) w Reflex to ID Panel     Status: None (Preliminary result)   Collection Time: 10/08/16 10:28 AM  Result Value Ref Range Status   Specimen Description BLOOD LEFT HAND  Final   Special Requests IN PEDIATRIC BOTTLE Blood Culture adequate volume  Final   Culture   Final    NO GROWTH 1 DAY Performed at [facility], [place] [address], [place], [place] [address]    Report Status PENDING  Incomplete  Culture, Urine     Status: Abnormal   Collection Time: 10/08/16  1:59 PM  Result Value Ref Range Status   Specimen Description URINE, CLEAN CATCH  Final   Special Requests NONE  Final   Culture MULTIPLE SPECIES PRESENT, SUGGEST RECOLLECTION (A)  Final   Report Status 10/09/2016 FINAL  Final  Gram stain     Status: None (Preliminary result)   Collection Time: 10/09/16  9:31 AM  Result Value Ref Range Status   Specimen Description FLUID PLEURAL  Final   Special Requests NONE  Final   Gram Stain   Final    RARE WBC PRESENT,BOTH PMN AND MONONUCLEAR NO ORGANISMS SEEN Performed at  [facility], [place] [address], [place], [place] [address]    Report Status PENDING  Incomplete     Labs: Basic Metabolic Panel:  Recent Labs Lab 10/05/16 1710 10/07/16 2248 10/08/16 1028 10/09/16 0402 10/10/16 0850  NA 141 141 140 140 138  K 3.4* 3.8 3.2* 3.9 3.8  CL 109 106 105 106 103  CO2 23 24 24 26 28   GLUCOSE 171* 145* 149* 103* 111*  BUN 14 21* 20 20 19   CREATININE 0.79 0.98 0.91 0.79 0.83  CALCIUM 9.2 9.1 9.0 8.9 8.8*  MG  --   --  1.9  --   --    Liver Function Tests:  Recent Labs Lab 10/04/16 0810 10/08/16 1028 10/09/16 0402  AST 21 32 29  ALT 20 33 38  ALKPHOS 51 47 54  BILITOT 0.31 0.6 0.6  PROT 6.6 6.3* 6.0*  ALBUMIN 3.0* 3.3* 3.1*   No results for input(s): LIPASE, AMYLASE in the last 168 hours. No results for input(s):  AMMONIA in the last 168 hours. CBC:  Recent Labs Lab 10/04/16 0810 10/05/16 1710 10/07/16 2248 10/08/16 0345 10/09/16 0402 10/10/16 0850  WBC 7.9 17.2* 53.7* 51.3* 40.4* 14.6*  NEUTROABS 6.1 15.7* 52.7*  --  39.2* 14.1*  HGB 11.3* 10.8* 10.8* 9.9* 9.8* 10.0*  HCT 36.0 32.9* 33.1* 30.3* 30.5* 30.9*  MCV 88.0 84.6 86.9 86.3 87.4 87.3  PLT 327 290 264 207 180 137*   Cardiac Enzymes: No results for input(s): CKTOTAL, CKMB, CKMBINDEX, TROPONINI in the last 168 hours. BNP: BNP (last 3 results)  Recent Labs  08/07/16 0023 10/07/16 2300  BNP 39.4 75.9    ProBNP (last 3 results) No results for input(s): PROBNP in the last 8760 hours.  CBG: No results for input(s): GLUCAP in the last 168 hours.     SignedIrine Seal MD.  [facility] 10/10/2016, 3:15 PM

## 2016-10-13 ENCOUNTER — Telehealth: Payer: Self-pay | Admitting: Hematology

## 2016-10-13 LAB — CULTURE, BLOOD (ROUTINE X 2)
CULTURE: NO GROWTH
Culture: NO GROWTH
SPECIAL REQUESTS: ADEQUATE
Special Requests: ADEQUATE

## 2016-10-13 NOTE — Telephone Encounter (Signed)
Added for to 10/16 lab per [facility]. New patient scheduler received message to schedule hosp f/u for 10/16 for established patient. Spoke with patient re lab/fu 10/16 @ 8:45 am.

## 2016-10-14 LAB — CULTURE, BODY FLUID W GRAM STAIN -BOTTLE: Culture: NO GROWTH

## 2016-10-14 LAB — CULTURE, BODY FLUID-BOTTLE

## 2016-10-17 ENCOUNTER — Telehealth: Payer: Self-pay | Admitting: *Deleted

## 2016-10-17 ENCOUNTER — Ambulatory Visit: Payer: Medicare Other | Admitting: Hematology

## 2016-10-17 ENCOUNTER — Other Ambulatory Visit: Payer: Medicare Other

## 2016-10-17 NOTE — Telephone Encounter (Signed)
SW patient regarding missed apt for lab/MD Kale this morning.  Pt stated PET was unable to be scheduled until 10/20.  Pt stated "someone" informed her it would not be necessary to be seen today without PET results.  Confirmed f/u apt with Dr. Irene Limbo on 10/24.  Pt aware.  Cancelled today's apts.

## 2016-10-21 ENCOUNTER — Encounter (HOSPITAL_COMMUNITY)
Admission: RE | Admit: 2016-10-21 | Discharge: 2016-10-21 | Disposition: A | Discharge status: Home / Self Care | Payer: Medicare Other | Source: Ambulatory Visit | Attending: Hematology | Admitting: Hematology

## 2016-10-21 DIAGNOSIS — C8338 Diffuse large B-cell lymphoma, lymph nodes of multiple sites: Secondary | ICD-10-CM | POA: Diagnosis present

## 2016-10-21 LAB — GLUCOSE, CAPILLARY: Glucose-Capillary: 106 mg/dL — ABNORMAL HIGH (ref 65–99)

## 2016-10-21 MED ORDER — FLUDEOXYGLUCOSE F - 18 (FDG) INJECTION: 12.7000 | Freq: Once | INTRAVENOUS | Status: DC | PRN

## 2016-10-25 ENCOUNTER — Ambulatory Visit (HOSPITAL_BASED_OUTPATIENT_CLINIC_OR_DEPARTMENT_OTHER): Payer: Medicare Other | Admitting: Hematology

## 2016-10-25 ENCOUNTER — Other Ambulatory Visit (HOSPITAL_BASED_OUTPATIENT_CLINIC_OR_DEPARTMENT_OTHER): Payer: Medicare Other

## 2016-10-25 ENCOUNTER — Other Ambulatory Visit: Payer: Self-pay

## 2016-10-25 ENCOUNTER — Ambulatory Visit (HOSPITAL_BASED_OUTPATIENT_CLINIC_OR_DEPARTMENT_OTHER): Payer: Medicare Other

## 2016-10-25 ENCOUNTER — Encounter: Payer: Self-pay | Admitting: Hematology

## 2016-10-25 VITALS — BP 120/77 | HR 65 | Temp 98.1°F | Resp 18

## 2016-10-25 VITALS — BP 130/63 | HR 100 | Temp 98.6°F | Resp 18 | Ht 62.0 in | Wt 280.2 lb

## 2016-10-25 DIAGNOSIS — Z452 Encounter for adjustment and management of vascular access device: Secondary | ICD-10-CM

## 2016-10-25 DIAGNOSIS — C8338 Diffuse large B-cell lymphoma, lymph nodes of multiple sites: Secondary | ICD-10-CM

## 2016-10-25 DIAGNOSIS — Z5111 Encounter for antineoplastic chemotherapy: Secondary | ICD-10-CM | POA: Diagnosis not present

## 2016-10-25 DIAGNOSIS — I480 Paroxysmal atrial fibrillation: Secondary | ICD-10-CM | POA: Diagnosis not present

## 2016-10-25 DIAGNOSIS — J90 Pleural effusion, not elsewhere classified: Secondary | ICD-10-CM

## 2016-10-25 DIAGNOSIS — Z5112 Encounter for antineoplastic immunotherapy: Secondary | ICD-10-CM | POA: Diagnosis not present

## 2016-10-25 DIAGNOSIS — I4891 Unspecified atrial fibrillation: Secondary | ICD-10-CM

## 2016-10-25 LAB — COMPREHENSIVE METABOLIC PANEL
ALBUMIN: 2.9 g/dL — AB (ref 3.5–5.0)
ALK PHOS: 45 U/L (ref 40–150)
ALT: 11 U/L (ref 0–55)
AST: 15 U/L (ref 5–34)
Anion Gap: 9 mEq/L (ref 3–11)
BUN: 8.8 mg/dL (ref 7.0–26.0)
CALCIUM: 9.1 mg/dL (ref 8.4–10.4)
CO2: 27 mEq/L (ref 22–29)
Chloride: 108 mEq/L (ref 98–109)
Creatinine: 0.8 mg/dL (ref 0.6–1.1)
Glucose: 116 mg/dl (ref 70–140)
POTASSIUM: 3.7 meq/L (ref 3.5–5.1)
SODIUM: 143 meq/L (ref 136–145)
Total Bilirubin: 0.4 mg/dL (ref 0.20–1.20)
Total Protein: 6.4 g/dL (ref 6.4–8.3)

## 2016-10-25 LAB — CBC & DIFF AND RETIC
BASO%: 0.4 % (ref 0.0–2.0)
BASOS ABS: 0 10*3/uL (ref 0.0–0.1)
EOS ABS: 0 10*3/uL (ref 0.0–0.5)
EOS%: 0.1 % (ref 0.0–7.0)
HEMATOCRIT: 32.2 % — AB (ref 34.8–46.6)
HEMOGLOBIN: 10 g/dL — AB (ref 11.6–15.9)
Immature Retic Fract: 12.3 % — ABNORMAL HIGH (ref 1.60–10.00)
LYMPH#: 0.5 10*3/uL — AB (ref 0.9–3.3)
LYMPH%: 6.7 % — ABNORMAL LOW (ref 14.0–49.7)
MCH: 27.6 pg (ref 25.1–34.0)
MCHC: 31.1 g/dL — ABNORMAL LOW (ref 31.5–36.0)
MCV: 89 fL (ref 79.5–101.0)
MONO#: 1.1 10*3/uL — AB (ref 0.1–0.9)
MONO%: 14 % (ref 0.0–14.0)
NEUT#: 6.1 10*3/uL (ref 1.5–6.5)
NEUT%: 78.8 % — AB (ref 38.4–76.8)
NRBC: 0 % (ref 0–0)
Platelets: 306 10*3/uL (ref 145–400)
RBC: 3.62 10*6/uL — ABNORMAL LOW (ref 3.70–5.45)
RDW: 16.7 % — AB (ref 11.2–14.5)
RETIC %: 2.17 % — AB (ref 0.70–2.10)
RETIC CT ABS: 78.55 10*3/uL (ref 33.70–90.70)
WBC: 7.8 10*3/uL (ref 3.9–10.3)

## 2016-10-25 MED ORDER — VINCRISTINE SULFATE CHEMO INJECTION 1 MG/ML
2.0000 mg | Freq: Once | INTRAVENOUS | Status: AC
  Administered 2016-10-25: 2 mg via INTRAVENOUS
  Filled 2016-10-25: qty 2

## 2016-10-25 MED ORDER — ALTEPLASE 2 MG IJ SOLR
2.0000 mg | Freq: Once | INTRAMUSCULAR | Status: AC | PRN
  Administered 2016-10-25: 2 mg
  Filled 2016-10-25: qty 2

## 2016-10-25 MED ORDER — SODIUM CHLORIDE 0.9% FLUSH
10.0000 mL | INTRAVENOUS | Status: DC | PRN
  Administered 2016-10-25: 10 mL
  Filled 2016-10-25: qty 10

## 2016-10-25 MED ORDER — PALONOSETRON HCL INJECTION 0.25 MG/5ML
INTRAVENOUS | Status: AC
  Filled 2016-10-25: qty 5

## 2016-10-25 MED ORDER — SODIUM CHLORIDE 0.9 % IV SOLN
Freq: Once | INTRAVENOUS | Status: AC
  Administered 2016-10-25: 14:00:00 via INTRAVENOUS
  Filled 2016-10-25: qty 5

## 2016-10-25 MED ORDER — DOXORUBICIN HCL CHEMO IV INJECTION 2 MG/ML
50.0000 mg/m2 | Freq: Once | INTRAVENOUS | Status: AC
  Administered 2016-10-25: 118 mg via INTRAVENOUS
  Filled 2016-10-25: qty 59

## 2016-10-25 MED ORDER — DIPHENHYDRAMINE HCL 25 MG PO CAPS
ORAL_CAPSULE | ORAL | Status: AC
  Filled 2016-10-25: qty 2

## 2016-10-25 MED ORDER — DIPHENHYDRAMINE HCL 25 MG PO CAPS
50.0000 mg | ORAL_CAPSULE | Freq: Once | ORAL | Status: AC
  Administered 2016-10-25: 50 mg via ORAL

## 2016-10-25 MED ORDER — SODIUM CHLORIDE 0.9 % IV SOLN
750.0000 mg/m2 | Freq: Once | INTRAVENOUS | Status: AC
  Administered 2016-10-25: 1780 mg via INTRAVENOUS
  Filled 2016-10-25: qty 89

## 2016-10-25 MED ORDER — ACETAMINOPHEN 325 MG PO TABS
ORAL_TABLET | ORAL | Status: AC
  Filled 2016-10-25: qty 1

## 2016-10-25 MED ORDER — ACETAMINOPHEN 325 MG PO TABS
650.0000 mg | ORAL_TABLET | Freq: Once | ORAL | Status: AC
  Administered 2016-10-25: 650 mg via ORAL

## 2016-10-25 MED ORDER — HEPARIN SOD (PORK) LOCK FLUSH 100 UNIT/ML IV SOLN
500.0000 [IU] | Freq: Once | INTRAVENOUS | Status: AC | PRN
  Administered 2016-10-25: 500 [IU]
  Filled 2016-10-25: qty 5

## 2016-10-25 MED ORDER — SODIUM CHLORIDE 0.9 % IV SOLN
375.0000 mg/m2 | Freq: Once | INTRAVENOUS | Status: AC
  Administered 2016-10-25: 900 mg via INTRAVENOUS
  Filled 2016-10-25: qty 50

## 2016-10-25 MED ORDER — DEXAMETHASONE 4 MG PO TABS: 8.0000 mg | ORAL_TABLET | Freq: Once | ORAL | 0 refills | Status: DC | PRN

## 2016-10-25 MED ORDER — SODIUM CHLORIDE 0.9 % IV SOLN
Freq: Once | INTRAVENOUS | Status: AC
  Administered 2016-10-25: 13:00:00 via INTRAVENOUS

## 2016-10-25 MED ORDER — PALONOSETRON HCL INJECTION 0.25 MG/5ML
0.2500 mg | Freq: Once | INTRAVENOUS | Status: AC
  Administered 2016-10-25: 0.25 mg via INTRAVENOUS

## 2016-10-25 NOTE — Patient Instructions (Signed)
[facility] Discharge Instructions for Patients Receiving Chemotherapy  Today you received the following chemotherapy agents Adriamycin, Vincristine, Cytoxan, Rituximab  To help prevent nausea and vomiting after your treatment, we encourage you to take your nausea medication as directed   If you develop nausea and vomiting that is not controlled by your nausea medication, call the clinic.   BELOW ARE SYMPTOMS THAT SHOULD BE REPORTED IMMEDIATELY:  *FEVER GREATER THAN 100.5 F  *CHILLS WITH OR WITHOUT FEVER  NAUSEA AND VOMITING THAT IS NOT CONTROLLED WITH YOUR NAUSEA MEDICATION  *UNUSUAL SHORTNESS OF BREATH  *UNUSUAL BRUISING OR BLEEDING  TENDERNESS IN MOUTH AND THROAT WITH OR WITHOUT PRESENCE OF ULCERS  *URINARY PROBLEMS  *BOWEL PROBLEMS  UNUSUAL RASH Items with * indicate a potential emergency and should be followed up as soon as possible.  Feel free to call the clinic should you have any questions or concerns. The clinic phone number is [phone].  Please show the [facility] at check-in to the Emergency Department and triage nurse.    Alteplase, TPA injection What is this medicine? ALTEPLASE (AL te plase) can dissolve blood clots that form in the heart, blood vessels, or lungs after a heart attack. This medicine is also given to improve recovery and decrease the chance of disability in patients having symptoms of a stroke. This medicine may be used for other purposes; ask your health care provider or pharmacist if you have questions. COMMON BRAND NAME(S): Activase, Cathflo Activase What should I tell my health care provider before I take this medicine? They need to know if you have any of these conditions: -aneurysm -bleeding problems or problems with blood clotting -blood vessel disease or damaged blood vessels -diabetic retinopathy -head injury or tumor -high blood pressure -infection -irregular heartbeats -previous stroke -recent biopsy  or surgery -an unusual or allergic reaction to alteplase, other medicines, foods, dyes, or preservatives -pregnant or trying to get pregnant -breast-feeding How should I use this medicine? This medicine is for injection into a vein. It is given by a health care professional in a hospital or clinic setting. Talk to your pediatrician regarding the use of this medicine in children. Special care may be needed. Overdosage: If you think you have taken too much of this medicine contact a poison control center or emergency room at once. NOTE: This medicine is only for you. Do not share this medicine with others. What if I miss a dose? This does not apply. What may interact with this medicine? Do not take this medicine with any of the following medications: -aminocaproic acid -aprotinin -tranexamic acid This medicine may also interact with the following medications: -antiinflammatory drugs, NSAIDs like ibuprofen -aspirin and aspirin-like medicines -blood thinners, like warfarin, heparin or enoxaparin -dipyridamole This list may not describe all possible interactions. Give your health care provider a list of all the medicines, herbs, non-prescription drugs, or dietary supplements you use. Also tell them if you smoke, drink alcohol, or use illegal drugs. Some items may interact with your medicine. What should I watch for while using this medicine? Your condition will be monitored carefully while you are receiving this medicine. Follow the advice of your doctor or health care professional exactly. You may need bed rest to minimize the risk of bleeding. This medicine can make you bleed more easily. This effect can last for several days. Take special care brushing or flossing your teeth. Do not take aspirin, ibuprofen, or other nonprescription pain relievers during or for several days  after alteplase treatment unless otherwise instructed by your doctor or health care professional. You may feel dizzy or  lightheaded. To avoid the risk of dizzy or fainting spells, sit or stand up slowly, especially if you are an older patient. What side effects may I notice from receiving this medicine? Side effects that you should report to your doctor or health care professional as soon as possible: -allergic reactions like skin rash, itching or hives, swelling of the face, lips, or tongue -signs and symptoms of bleeding such as bloody or black, tarry stools; red or dark-brown urine; spitting up blood or brown material that looks like coffee grounds; red spots on the skin; unusual bruising or bleeding from the eye, gums, or nose -signs and symptoms of a stroke such as changes in vision; confusion; trouble speaking or understanding; severe headaches; sudden numbness or weakness of the face, arm, or leg; trouble walking; dizziness; loss of balance or coordination -slow or fast heart rate Side effects that usually do not require medical attention (report to your doctor or health care professional if they continue or are bothersome): -dizziness -fever -nausea, vomiting This list may not describe all possible side effects. Call your doctor for medical advice about side effects. You may report side effects to FDA at 1-800-FDA-1088. Where should I keep my medicine? This does not apply. You will not be given this medicine to store at home. NOTE: This sheet is a summary. It may not cover all possible information. If you have questions about this medicine, talk to your doctor, pharmacist, or health care provider.  2018 [employer] (2012-04-16 19:16:18)

## 2016-10-25 NOTE — Progress Notes (Signed)
Jo Collins  HEMATOLOGY ONCOLOGY INPATIENT PROGRESS NOTE  Date of service: 10/25/16  Patient Care Team: Deland Pretty, MD as PCP - General (Internal Medicine)  CC: post hospitalization f/u for management of newly diagnosed diffuse large B cell lymphoma  Diagnosis: Diffuse large B cell lymphoma  Current Treatment: R-CHOP with G-CSF support s/p R-CHOP x 2 cycles.  SUBJECTIVE:  Of note since the patient's last visit, She reported to the ED on 10/05/2016 with non-intractable vomiting with nausea following IT chemotherapy. She was admitted to hospital on 10/07/2016 for 3 days due to recurrent right pleural effusion. She had a PET scan skull base to thigh on 10/21/2016 with results revealing:IMPRESSION:1. There has been significant interval improvement in the chest, abdomen, and pelvis in the interval. The dominant mass in the chest is much smaller and less FDG avid in the interval. However, the remaining maximum uptake is slightly greater than mediastinal blood pool. The dominant remaining disease in the pelvis is an external iliac lymph node with a maximum SUV moderately greater than liver blood pool. Evaluation of the bones is more difficult today given the diffuse uptake likely associated with recent chemotherapy. I suspect the previously identified humeral lesion remains. Recommend continued attention to the bones on follow-up. Deauville Category 4  Patient is here for f/u for DLBCL prior to her 4rd cycle of RCHOP s/p third cycle of R CHOP on 10/05/2016. She is accompanied by her husband. She reports that she is doing well overall. She reports severe nausea following her IT methotrexate.  We added Emend with her R-CHOP and dexamethasone and compazine D2 pre-IT MTX.   She states that she feels SOB following heavy physical activity. Her productive cough has resolved and she is trying to remain active by walking. We discussed and she agreed to pursue thoracentesis to remove additional pleural fluid. Has been  given a pulmonology referral for mx of pleural effusion which is gradually improving with treatment of her lymphoma. EKG today showed NSR with no afib.  On review of systems, pt reports SOB, nausea and denies back pain, HA, and any other accompanying symptoms.     REVIEW OF SYSTEMS:    10 Point review of systems of done and is negative except as noted above.  . Past Medical History:  Diagnosis Date  . GERD (gastroesophageal reflux disease)   . NHL (non-Hodgkin's lymphoma) [facility])     Past Surgical History:  Procedure Laterality Date  . ABDOMINAL HYSTERECTOMY    . IR FLUORO GUIDE PORT INSERTION RIGHT  08/18/2016  . IR THORACENTESIS ASP PLEURAL SPACE W/IMG GUIDE  08/07/2016  . IR THORACENTESIS ASP PLEURAL SPACE W/IMG GUIDE  08/14/2016  . IR US GUIDE VASC ACCESS RIGHT  08/18/2016    . Social History  Substance Use Topics  . Smoking status: Former Smoker    Packs/day: 0.50    Years: 47.00    Types: Cigarettes    Quit date: 07/28/2016  . Smokeless tobacco: Never Used  . Alcohol use No    ALLERGIES:  has No Known Allergies.  MEDICATIONS:  Current Outpatient Prescriptions  Medication Sig Dispense Refill  . acetaminophen (TYLENOL) 500 MG tablet Take 1,000 mg by mouth every 6 (six) hours as needed for moderate pain.    . benzonatate (TESSALON) 100 MG capsule Take 1 capsule (100 mg total) by mouth 3 (three) times daily as needed for cough. 20 capsule 0  . diphenhydrAMINE (BENADRYL) 25 MG tablet Take 25-50 mg by mouth daily as needed for allergies (chemotherapy  infusion).     . fluticasone (FLONASE) 50 MCG/ACT nasal spray Place 2 sprays into both nostrils daily. 16 g 0  . furosemide (LASIX) 20 MG tablet Take 1 tablet (20 mg total) by mouth daily. 30 tablet 1  . HYDROcodone-homatropine (HYCODAN) 5-1.5 MG/5ML syrup Take 5 mLs by mouth 3 (three) times daily. 120 mL 0  . lidocaine-prilocaine (EMLA) cream Apply 1 application topically as needed. Apply to port-a-cath assess site 45-60 mins  prior to chemotherapy. 30 g 6  . loratadine (CLARITIN) 10 MG tablet Take 10 mg by mouth daily as needed for allergies.    . polyethylene glycol (MIRALAX / GLYCOLAX) packet Take 17 g by mouth daily. 14 each 0  . predniSONE (DELTASONE) 20 MG tablet Take 3 tablets (60 mg total) by mouth daily. Take on days 1-5 of chemotherapy. 20 tablet 5  . traZODone (DESYREL) 50 MG tablet Take 1 tablet (50 mg total) by mouth at bedtime as needed for sleep. 30 tablet 0  . vitamin B-12 500 MCG tablet Take 1 tablet (500 mcg total) by mouth daily. (Patient taking differently: Take 500 mcg by mouth every other day. ) 30 tablet 0  . Multiple Vitamin (MULTIVITAMIN WITH MINERALS) TABS tablet Take 1 tablet by mouth daily.    . prochlorperazine (COMPAZINE) 10 MG tablet Take 1 tablet (10 mg total) by mouth every 6 (six) hours as needed (Nausea or vomiting). (Patient not taking: Reported on 10/25/2016) 30 tablet 6   No current facility-administered medications for this visit.    Facility-Administered Medications Ordered in Other Visits  Medication Dose Route Frequency Provider Last Rate Last Dose  . fludeoxyglucose F - 18 (FDG) injection 67.2 millicurie  09.4 millicurie Intravenous Once PRN Gus Height, MD        PHYSICAL EXAMINATION: ECOG PERFORMANCE STATUS: 1 - Symptomatic but completely ambulatory  . Vitals:   10/25/16 0939  BP: 130/63  Pulse: 100  Resp: 18  Temp: 98.6 F (37 C)  SpO2: 100%    Filed Weights   10/25/16 0939  Weight: 280 lb 3.2 oz (127.1 kg)   .Body mass index is 51.25 kg/m.  GENERAL:alert, in no acute distress and comfortable, swelling in ankles 1+ SKIN: no acute rashes, no significant lesions EYES: conjunctiva are pink and non-injected, sclera anicteric OROPHARYNX: MMM, no exudates, no oropharyngeal erythema or ulceration NECK: supple, no JVD LYMPH:  no palpable lymphadenopathy in the cervical, axillary or inguinal regions LUNGS: distant breath sounds. Decreased AE rt base about 1/4  up posterior chest wall. HEART: S1/S2 irregular rate & rhythm ABDOMEN:  normoactive bowel sounds , non tender, not distended. Extremity: grade 1 pedal edema PSYCH: alert & oriented x 3 with fluent speech NEURO: no focal motor/sensory deficits   LABORATORY DATA:   I have reviewed the data as listed  . CBC Latest Ref Rng & Units 10/25/2016 10/10/2016 10/09/2016  WBC 3.9 - 10.3 10e3/uL 7.8 14.6(H) 40.4(H)  Hemoglobin 11.6 - 15.9 g/dL 10.0(L) 10.0(L) 9.8(L)  Hematocrit 34.8 - 46.6 % 32.2(L) 30.9(L) 30.5(L)  Platelets 145 - 400 10e3/uL 306 137(L) 180    . CMP Latest Ref Rng & Units 10/25/2016 10/10/2016 10/09/2016  Glucose 70 - 140 mg/dl 116 111(H) 103(H)  BUN 7.0 - 26.0 mg/dL 8.8 19 20   Creatinine 0.6 - 1.1 mg/dL 0.8 0.83 0.79  Sodium 136 - 145 mEq/L 143 138 140  Potassium 3.5 - 5.1 mEq/L 3.7 3.8 3.9  Chloride 101 - 111 mmol/L - 103 106  CO2 22 - 29  mEq/L 27 28 26   Calcium 8.4 - 10.4 mg/dL 9.1 8.8(L) 8.9  Total Protein 6.4 - 8.3 g/dL 6.4 - 6.0(L)  Total Bilirubin 0.20 - 1.20 mg/dL 0.40 - 0.6  Alkaline Phos 40 - 150 U/L 45 - 54  AST 5 - 34 U/L 15 - 29  ALT 0 - 55 U/L 11 - 38   . Lab Results  Component Value Date   LDH 219 (H) 10/08/2016     RADIOGRAPHIC STUDIES: I have personally reviewed the radiological images as listed and agreed with the findings in the report. Dg Chest 1 View  Result Date: 10/09/2016 CLINICAL DATA:  Status post right-sided thoracentesis. EXAM: CHEST 1 VIEW COMPARISON:  Radiographs of October 07, 2016. FINDINGS: Stable cardiomediastinal silhouette. Right pleural effusion is significantly smaller status post thoracentesis. No pneumothorax is noted. Left lung is clear. Right internal jugular Port-A-Cath is unchanged in position. Bony thorax is unremarkable. IMPRESSION: Right pleural effusion is significantly smaller status post thoracentesis. No pneumothorax is noted. Electronically Signed   By: Marijo Conception, M.D.   On: 10/09/2016 10:27   Dg Chest 1  View  Result Date: 09/26/2016 CLINICAL DATA:  Right thoracentesis. Shortness of breath and coughing. EXAM: CHEST 1 VIEW COMPARISON:  Ultrasound 09/26/2016.  Chest x-ray 09/25/2016. FINDINGS: PowerPort catheter noted with lead tip projected over the right atrium. Heart size normal. Right base atelectasis. Small moderate right-sided pleural effusion, significantly decreased from prior exam. No pneumothorax. IMPRESSION: No pneumothorax post thoracentesis. Removal of significant amount of right pleural effusion . Persistent right base atelectasis and mild to moderate right pleural effusion . Electronically Signed   By: Marcello Moores  Register   On: 09/26/2016 10:49   Dg Chest 2 View  Result Date: 10/07/2016 CLINICAL DATA:  Shortness of breath, onset today. Cancer in the spine and esophagus. Recent thoracentesis 1 week ago. Former smoker. EXAM: CHEST  2 VIEW COMPARISON:  09/27/2016 FINDINGS: Large right pleural effusion, increased since previous study. Atelectasis or consolidation in the right lung base. Left lung is clear. Heart size and pulmonary vascularity are normal. Right central venous catheter with tip over the mid SVC region. No pneumothorax. Degenerative changes in the spine. IMPRESSION: Large right pleural effusion increased since previous study. Consolidation or atelectasis in the right lung base. Left lung is clear. Electronically Signed   By: Lucienne Capers M.D.   On: 10/07/2016 22:21   Dg Chest 2 View  Result Date: 09/27/2016 CLINICAL DATA:  Shortness of Breath EXAM: CHEST  2 VIEW COMPARISON:  September 26, 2016 FINDINGS: Port-A-Cath tip is in the superior vena cava. No pneumothorax. Sizable partially loculated pleural effusion on the right remains with consolidation in portions of the right middle and lower lobes. Lungs elsewhere are clear. Heart size and pulmonary vascularity are normal. No evident adenopathy. There is degenerative change in the thoracic spine. IMPRESSION: Port-A-Cath tip in  superior vena cava.  No pneumothorax. Partially loculated right pleural effusion persists with areas of consolidation in portions of the right middle and lower lobes. No new opacity. Lungs elsewhere clear. Stable cardiac silhouette. Electronically Signed   By: Lowella Grip III M.D.   On: 09/27/2016 08:31   Nm Pet Image Restag (ps) Skull Base To Thigh  Result Date: 10/22/2016 CLINICAL DATA:  Diffuse large B-cell lymphoma restaging after 2 out of 4 cycles of chemotherapy. EXAM: NUCLEAR MEDICINE PET SKULL BASE TO THIGH TECHNIQUE: 12.7 mCi F-18 FDG was injected intravenously. Full-ring PET imaging was performed from the skull base to thigh  after the radiotracer. CT data was obtained and used for attenuation correction and anatomic localization. FASTING BLOOD GLUCOSE:  Value: 106 mg/dl COMPARISON:  PET-CT August 19, 2016 FINDINGS: NECK: Uptake in the right side of the mandible is likely odontogenic. No FDG avid disease identified in the head or neck. There is mild muscular uptake in the left side of the neck. CHEST: The hypermetabolic paraspinal mass on the previous study has significantly improved with only mild scattered soft tissue in this region today. The patchy nature of soft tissue remaining makes it difficult to measure accurately. However, the mass measured 8.6 x 9.4 cm previously and measures up to 4.7 by 2.1 cm on series 4, image 87 of today's study. There is low level uptake remaining in this region today with a maximum SUV of 5.78 today versus 17.7 previously. The FDG avid pleural nodularity seen on the previous study has resolved. The 3 x 1.3 cm pleural nodule medially on the right on series 4, image 62 of the previous study is no longer seen today. The juxta diaphragmatic lymph node described on the previous study has resolved as well. No other soft tissue disease seen in the chest on today's study. Mediastinal blood pool demonstrates an SUV of 5.5. ABDOMEN/PELVIS: No abnormal uptake in the  parenchymal organs of the abdomen or pelvis. Specifically, no hepatic or splenic uptake noted. Physiologic uptake is seen in the renal collecting system and bowel. The gastrohepatic ligament node described on the previous study is smaller in the interval measuring 8 mm today versus 12 mm previously with a maximum SUV of 3.4 today versus 4.9 previously. The right external iliac lymph node measuring 2.4 cm on the previous study measures 1.9 cm on series 4, image 161 today. The maximum SUV is 8.2 today versus 9.3 previously. The previously described right inguinal node has resolved in the interval and is no longer clearly visualized with no abnormal FDG uptake in this region today. No other FDG avid disease is seen in the abdomen or pelvis. Liver blood pool demonstrates a maximum SUV of 6.0. SKELETON: There is diffuse uptake throughout the bones which is mildly heterogeneous. I suspect the majority of this uptake is due to recent chemotherapy and supportive measures. The abnormal uptake in the left humeral shaft on the previous study does percent with a maximum SUV of 8.5 today versus 10.8 previously. This uptake is less conspicuous today however due to the diffuse marrow uptake. The uptake in T7 and T8 is now similar to to the diffuse marrow uptake seen at multiple levels. INCIDENTAL FINDINGS: A right pleural effusion remains. The right Port-A-Cath is in good position. IMPRESSION: 1. There has been significant interval improvement in the chest, abdomen, and pelvis in the interval. The dominant mass in the chest is much smaller and less FDG avid in the interval. However, the remaining maximum uptake is slightly greater than mediastinal blood pool. The dominant remaining disease in the pelvis is an external iliac lymph node with a maximum SUV moderately greater than liver blood pool. Evaluation of the bones is more difficult today given the diffuse uptake likely associated with recent chemotherapy. I suspect the  previously identified humeral lesion remains. Recommend continued attention to the bones on follow-up. Deauville Category 4 Electronically Signed   By: Dorise Bullion III M.D   On: 10/22/2016 16:32   US Thoracentesis Asp Pleural Space W/img Guide  Result Date: 10/09/2016 INDICATION: Patient with a history of non-Hodgkin's lymphoma with recurrent right pleural effusion. Request  is made for diagnostic and therapeutic thoracentesis. EXAM: ULTRASOUND GUIDED DIAGNOSTIC AND THERAPEUTIC THORACENTESIS MEDICATIONS: 2% xylocaine COMPLICATIONS: None immediate. PROCEDURE: An ultrasound guided thoracentesis was thoroughly discussed with the patient and questions answered. The benefits, risks, alternatives and complications were also discussed. The patient understands and wishes to proceed with the procedure. Written consent was obtained. Ultrasound was performed to localize and mark an adequate pocket of fluid in the right chest. The area was then prepped and draped in the normal sterile fashion. 2% xylocaine was used for local anesthesia. Under ultrasound guidance a Safe-T-Centesis catheter was introduced. Thoracentesis was performed. The catheter was removed and a dressing applied. FINDINGS: A total of approximately 1.7 L of pain, chylous colored fluid was removed. Samples were sent to the laboratory as requested by the clinical team. IMPRESSION: Successful ultrasound guided right thoracentesis yielding 1.7 L of pleural fluid. Procedure terminated slightly early secondary to excessive coughing. Postprocedure chest radiograph shows no pneumothorax. Read by: Saverio Danker, PA-C Electronically Signed   By: Lucrezia Europe M.D.   On: 10/09/2016 11:20   US Thoracentesis Asp Pleural Space W/img Guide  Result Date: 09/26/2016 INDICATION: Patient with history of non-Hodgkin's lymphoma, dyspnea, right pleural effusion. Request made for diagnostic and therapeutic right thoracentesis. EXAM: ULTRASOUND GUIDED DIAGNOSTIC AND  THERAPEUTIC RIGHT THORACENTESIS MEDICATIONS: None. COMPLICATIONS: None immediate. PROCEDURE: An ultrasound guided thoracentesis was thoroughly discussed with the patient and questions answered. The benefits, risks, alternatives and complications were also discussed. The patient understands and wishes to proceed with the procedure. Written consent was obtained. Ultrasound was performed to localize and mark an adequate pocket of fluid in the right chest. The area was then prepped and draped in the normal sterile fashion. 2% Lidocaine was used for local anesthesia. Under ultrasound guidance a Safe-T-Centesis catheter was introduced. Thoracentesis was performed. The catheter was removed and a dressing applied. FINDINGS: A total of approximately 1.2 liters of blood-tinged, chylous/milky fluid was removed. Samples were sent to the laboratory as requested by the clinical team. Only the above amount of fluid was removed today secondary to persistent patient coughing. IMPRESSION: Successful ultrasound guided diagnostic and therapeutic right thoracentesis yielding 1.2 of pleural fluid. Read by: Rowe Robert, PA-C Electronically Signed   By: Sandi Mariscal M.D.   On: 09/26/2016 13:36   Dg Fluoro Guided Loc Of Needle/cath Tip For Spinal Inject Lt  Result Date: 10/05/2016 CLINICAL DATA:  DIFFUSE LARGE B-CELL LYMPHOMA EXAM: FLUOROSCOPICALLY GUIDED LUMBAR PUNCTURE FOR INTRATHECAL CHEMOTHERAPY DIAGNOSTIC LUMBAR PUNCTURE UNDER FLUOROSCOPIC GUIDANCE TECHNIQUE: I discussed the risks (including hemorrhage, infection, headache, and nerve damage, among others), benefits, and alternatives to fluoroscopically guided lumbar puncture with intrathecal methotrexate administration with the patient. We specifically discussed the technical likelihood of success of the procedure. The patient understood and elected to undergo the procedure. Standard time-out was employed. Following sterile skin prep and local anesthetic administration consisting  of 1 percent lidocaine, a 22 gauge 5 inch spinal needle was advanced in the midline at L2-3. It might be feasible to use a 4.5 inch needle rather than the 5 inch, but the standard 3.5 inch in length needle would not be adequate due to body habitus. There was a very small midline window that was difficult to access. Accordingly I turned the patient slightly an used a paramedian approach at the same level. The needle was advanced difficulty into the thecal sac. Clear CSF was returned. Opening pressure was not requested or obtained. 12 cc of clear CSF was collected. CSF flow was slow but  steady. Slow rate of flow partially attributable to the long needle length and 22 gauge size. Subsequently the medication syringe containing 12 mg of methotrexate was attached and very slowly injected. The patient did note mild left buttock discomfort during injection. I removed the syringe and there was some backflow of medication, suggesting that the positioning of the needle tip was still intrathecal. Also, the injection was proceeding easily without significant pressure required to inject, also typical of intrathecal positioning. After all of the medication have been administered, the needle was removed and the skin cleansed and bandaged. Aside from the mild left buttock discomfort common no immediate complications were observed. FLUOROSCOPY TIME:  1 minutes, 16 seconds IMPRESSION: 1. Technically successful fluoroscopically guided lumbar puncture with intrathecal methotrexate administration. 12 cc of clear CSF were obtained for diagnostic purposes. The patient did note mild left buttock discomfort after the exam, we will continue to monitor during her observation. Electronically Signed   By: Van Clines M.D.   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  -PET scan skull base to thigh on 10/21/2016 with results revealing: IMPRESSION:1. There has been significant interval improvement in the chest, abdomen, and pelvis in the interval. The dominant mass in the chest is much smaller and less FDG avid in the interval. However, the remaining maximum uptake is slightly greater than mediastinal blood pool. The dominant remaining disease in the pelvis is an external iliac lymph node with a maximum SUV moderately greater than liver blood pool. Evaluation of the bones is more difficult today given the diffuse uptake likely associated with recent chemotherapy. I suspect the previously identified humeral lesion remains. Recommend continued attention to the bones on follow-up. Deauville Category 4   #2 Recurrent right pleural effusion likely related to lymphoma possibly from decreased lymphatic drainage. Additional element of pleural effusion from ? Diastolic dysfunction +steroids with fluid retention+ atelectasis from body habitus   #3 Back pain with large paraspinal mass in the thoracic region with possible epidural extension. No overt neurological symptoms related to this.  No significant back pain related to this curently - this has resolved with response of her lymphoma to treatment.  Plan Labs stable and no prohibitive new toxicities Patient appropriate to proceed with 4rd cycle of R-CHOP from today -Advised to continue use of OTC Claritin or zyrtec for sinus issues.  -OTC PPI use advised to aid with GERD symptoms.  - I have added Emend with her R-CHOP and dexamethasone and compazine D2 pre-IT MTX. To help with nausea -US guided thoracentesis requested for recurrent pleural effusion to help with breathing.    #4 .Paroxysmal atrial fibrillation - currently in sinus rhythm today. Plan -given referral to cardiology for continued management  of this.   Patient Active Problem List   Diagnosis Date Noted  . S/P thoracentesis   . Cough   . Neutrophilic leukocytosis 52/84/1324  . Recurrent pleural effusion on right 10/08/2016  . SOB (shortness of breath) 09/26/2016  . Acute respiratory distress 09/25/2016  . Chronic low back pain 09/25/2016  . Hypokalemia 09/25/2016  . Recurrent right pleural effusion 09/25/2016  . DLBCL (diffuse large B cell lymphoma) ([place]) 08/17/2016  . Acute midline thoracic back pain   .  Lymphadenopathy   . Morbid obesity with BMI of 50.0-59.9, adult ([place])   . NHL (non-Hodgkin's lymphoma) ([place])   . Abdominal pain, RUQ   . Pleural effusion on right 08/07/2016  . Abnormal EKG 08/07/2016  . 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 Anemia 08/07/2016  . GERD (gastroesophageal reflux disease) 08/07/2016  . Shortness of breath 08/07/2016   -continue f/u with PCP for other medical cares and for continue mx of Paroxysmal afib. -would recommend sleep study to evaluate for sleep apnea.   Continue IV chemotherapy q3weeks - plz schedule C5 and C6 -IT MTX tomorrow and in 3 weeks -EKG today -Referral to cardiology for atrial fibrillation Dorris Carnes)  -RTC with Dr. Irene Limbo in 3 weeks with labs with C5D1   I spent 30 minutes counseling the patient face to face. The total time spent in the appointment was 40 minutes and more than 50% was on counseling and direct patient cares.    Sullivan Lone MD [place] AAHIVMS [facility] Hematology/Oncology Physician [facility]  (Office):       [phone] (Work cell):  [phone] (Fax):           [phone]  This document serves as a record of services personally performed by Sullivan Lone, MD. It was created on her behalf by Alean Rinne, a trained medical scribe. The creation of this record is based on the scribe's personal observations and the provider's statements to them. This document has been checked and approved by the attending provider.

## 2016-10-25 NOTE — Progress Notes (Signed)
Blood return noted before, every 3 cc and after Adriamycin push and blood return noted before and after Vincristine infusion.  Per Dr. Irene Limbo pt to not receive flu shot today. Per Aldona Bar RN per Dr. Irene Limbo okay for pt to dye study. Pt aware and verbalizes understanding.

## 2016-10-26 ENCOUNTER — Ambulatory Visit (HOSPITAL_COMMUNITY)
Admission: RE | Admit: 2016-10-26 | Discharge: 2016-10-26 | Disposition: A | Discharge status: Home / Self Care | Payer: Medicare Other | Source: Ambulatory Visit | Attending: Hematology | Admitting: Hematology

## 2016-10-26 ENCOUNTER — Encounter (HOSPITAL_COMMUNITY): Payer: Self-pay

## 2016-10-26 ENCOUNTER — Other Ambulatory Visit (HOSPITAL_COMMUNITY): Payer: Self-pay | Admitting: Diagnostic Radiology

## 2016-10-26 ENCOUNTER — Other Ambulatory Visit: Payer: Self-pay

## 2016-10-26 ENCOUNTER — Telehealth: Payer: Self-pay

## 2016-10-26 DIAGNOSIS — C8338 Diffuse large B-cell lymphoma, lymph nodes of multiple sites: Secondary | ICD-10-CM

## 2016-10-26 DIAGNOSIS — Z5111 Encounter for antineoplastic chemotherapy: Secondary | ICD-10-CM | POA: Insufficient documentation

## 2016-10-26 DIAGNOSIS — Z95828 Presence of other vascular implants and grafts: Secondary | ICD-10-CM

## 2016-10-26 DIAGNOSIS — C8590 Non-Hodgkin lymphoma, unspecified, unspecified site: Secondary | ICD-10-CM

## 2016-10-26 HISTORY — PX: IR CV LINE INJECTION: IMG2294

## 2016-10-26 MED ORDER — HEPARIN SOD (PORK) LOCK FLUSH 100 UNIT/ML IV SOLN
INTRAVENOUS | Status: AC
  Filled 2016-10-26: qty 5

## 2016-10-26 MED ORDER — LIDOCAINE HCL 1 % IJ SOLN
INTRAMUSCULAR | Status: AC
  Filled 2016-10-26: qty 20

## 2016-10-26 MED ORDER — HEPARIN SOD (PORK) LOCK FLUSH 100 UNIT/ML IV SOLN
INTRAVENOUS | Status: DC | PRN
  Administered 2016-10-26: 500 [IU]

## 2016-10-26 MED ORDER — IOPAMIDOL (ISOVUE-300) INJECTION 61%
INTRAVENOUS | Status: AC
  Filled 2016-10-26: qty 50

## 2016-10-26 MED ORDER — HYDROMORPHONE HCL 1 MG/ML IJ SOLN: 0.2000 mg | Freq: Once | INTRAMUSCULAR | Status: DC

## 2016-10-26 MED ORDER — METHOTREXATE SODIUM CHEMO INJECTION (PF) 50 MG/2ML
Freq: Once | INTRAMUSCULAR | Status: AC
  Administered 2016-10-26: 12:00:00 via INTRATHECAL
  Filled 2016-10-26: qty 0.48

## 2016-10-26 NOTE — Progress Notes (Signed)
Spoke with Jo Collins with Dr Irene Limbo in regards to pts pain management secondary to post pocedure. No orders given at this time.

## 2016-10-26 NOTE — Progress Notes (Signed)
RN called diagnostic radiology s/p intrathecal chemo, patient c/o 5/10 sacral pain, stool incontinence,  Tech states these symptoms are not related to lumbar puncture, they are related to chemotherapy medication and RN needs to contact patients oncologist.  Message left for Dr Grier Mitts RN to return call.

## 2016-10-26 NOTE — Discharge Instructions (Signed)
Lumbar Puncture, Care After Refer to this sheet in the next few weeks. These instructions provide you with information on caring for yourself after your procedure. Your health care provider may also give you more specific instructions. Your treatment has been planned according to current medical practices, but problems sometimes occur. Call your health care provider if you have any problems or questions after your procedure. What can I expect after the procedure? After your procedure, it is typical to have the following sensations:  Mild discomfort or pain at the insertion site.  Mild headache that is relieved with pain medicines.  Follow these instructions at home:   Avoid lifting anything heavier than 10 lb (4.5 kg) for at least 12 hours after the procedure.  Drink enough fluids to keep your urine clear or pale yellow. Contact a health care provider if:  You have fever or chills.  You have nausea or vomiting.  You have a headache that lasts for more than 2 days. Get help right away if:  You have any numbness or tingling in your legs.  You are unable to control your bowel or bladder.  You have bleeding or swelling in your back at the insertion site.  You are dizzy or faint. This information is not intended to replace advice given to you by your health care provider. Make sure you discuss any questions you have with your health care provider. Document Released: 12/24/2012 Document Revised: 05/27/2015 Document Reviewed: 08/27/2012 Elsevier Interactive Patient Education  2017 [place].   Methotrexate injection What is this medicine? METHOTREXATE (METH oh TREX ate) is a chemotherapy drug used to treat cancer including breast cancer, leukemia, and lymphoma. This medicine can also be used to treat psoriasis and certain kinds of arthritis. This medicine may be used for other purposes; ask your health care provider or pharmacist if you have questions. What should I tell my  health care provider before I take this medicine? They need to know if you have any of these conditions: -fluid in the stomach area or lungs -if you often drink alcohol -infection or immune system problems -kidney disease -liver disease -low blood counts, like low white cell, platelet, or red cell counts -lung disease -radiation therapy -stomach ulcers -ulcerative colitis -an unusual or allergic reaction to methotrexate, other medicines, foods, dyes, or preservatives -pregnant or trying to get pregnant -breast-feeding How should I use this medicine? This medicine is for infusion into a vein or for injection into muscle or into the spinal fluid (whichever applies). It is usually given by a health care professional in a hospital or clinic setting. In rare cases, you might get this medicine at home. You will be taught how to give this medicine. Use exactly as directed. Take your medicine at regular intervals. Do not take your medicine more often than directed. If this medicine is used for arthritis or psoriasis, it should be taken weekly, NOT daily. It is important that you put your used needles and syringes in a special sharps container. Do not put them in a trash can. If you do not have a sharps container, call your pharmacist or healthcare provider to get one. Talk to your pediatrician regarding the use of this medicine in children. While this drug may be prescribed for children as young as 2 years for selected conditions, precautions do apply. Overdosage: If you think you have taken too much of this medicine contact a poison control center or emergency room at once. NOTE: This medicine is only for  you. Do not share this medicine with others. What if I miss a dose? It is important not to miss your dose. Call your doctor or health care professional if you are unable to keep an appointment. If you give yourself the medicine and you miss a dose, talk with your doctor or health care professional.  Do not take double or extra doses. What may interact with this medicine? This medicine may interact with the following medications: -acitretin -aspirin or aspirin-like medicines including salicylates -azathioprine -certain antibiotics like chloramphenicol, penicillin, tetracycline -certain medicines for stomach problems like esomeprazole, omeprazole, pantoprazole -cyclosporine -gold -hydroxychloroquine -live virus vaccines -mercaptopurine -NSAIDs, medicines for pain and inflammation, like ibuprofen or naproxen -other cytotoxic agents -penicillamine -phenylbutazone -phenytoin -probenacid -retinoids such as isotretinoin and tretinoin -steroid medicines like prednisone or cortisone -sulfonamides like sulfasalazine and trimethoprim/sulfamethoxazole -theophylline This list may not describe all possible interactions. Give your health care provider a list of all the medicines, herbs, non-prescription drugs, or dietary supplements you use. Also tell them if you smoke, drink alcohol, or use illegal drugs. Some items may interact with your medicine. What should I watch for while using this medicine? Avoid alcoholic drinks. In some cases, you may be given additional medicines to help with side effects. Follow all directions for their use. This medicine can make you more sensitive to the sun. Keep out of the sun. If you cannot avoid being in the sun, wear protective clothing and use sunscreen. Do not use sun lamps or tanning beds/booths. You may get drowsy or dizzy. Do not drive, use machinery, or do anything that needs mental alertness until you know how this medicine affects you. Do not stand or sit up quickly, especially if you are an older patient. This reduces the risk of dizzy or fainting spells. You may need blood work done while you are taking this medicine. Call your doctor or health care professional for advice if you get a fever, chills or sore throat, or other symptoms of a cold or flu.  Do not treat yourself. This drug decreases your body's ability to fight infections. Try to avoid being around people who are sick. This medicine may increase your risk to bruise or bleed. Call your doctor or health care professional if you notice any unusual bleeding. Check with your doctor or health care professional if you get an attack of severe diarrhea, nausea and vomiting, or if you sweat a lot. The loss of too much body fluid can make it dangerous for you to take this medicine. Talk to your doctor about your risk of cancer. You may be more at risk for certain types of cancers if you take this medicine. Both men and women must use effective birth control with this medicine. Do not become pregnant while taking this medicine or until at least 1 normal menstrual cycle has occurred after stopping it. Women should inform their doctor if they wish to become pregnant or think they might be pregnant. Men should not father a child while taking this medicine and for 3 months after stopping it. There is a potential for serious side effects to an unborn child. Talk to your health care professional or pharmacist for more information. Do not breast-feed an infant while taking this medicine. What side effects may I notice from receiving this medicine? Side effects that you should report to your doctor or health care professional as soon as possible: -allergic reactions like skin rash, itching or hives, swelling of the face, lips, or tongue -back  pain -breathing problems or shortness of breath -confusion -diarrhea -dry, nonproductive cough -low blood counts - this medicine may decrease the number of white blood cells, red blood cells and platelets. You may be at increased risk of infections and bleeding -mouth sores -redness, blistering, peeling or loosening of the skin, including inside the mouth -seizures -severe headaches -signs of infection - fever or chills, cough, sore throat, pain or difficulty  passing urine -signs and symptoms of bleeding such as bloody or black, tarry stools; red or dark-brown urine; spitting up blood or brown material that looks like coffee grounds; red spots on the skin; unusual bruising or bleeding from the eye, gums, or nose -signs and symptoms of kidney injury like trouble passing urine or change in the amount of urine -signs and symptoms of liver injury like dark yellow or brown urine; general ill feeling or flu-like symptoms; light-colored stools; loss of appetite; nausea; right upper belly pain; unusually weak or tired; yellowing of the eyes or skin -stiff neck -vomiting Side effects that usually do not require medical attention (report to your doctor or health care professional if they continue or are bothersome): -dizziness -hair loss -headache -stomach pain -upset stomach This list may not describe all possible side effects. Call your doctor for medical advice about side effects. You may report side effects to FDA at 1-800-FDA-1088. Where should I keep my medicine? If you are using this medicine at home, you will be instructed on how to store this medicine. Throw away any unused medicine after the expiration date on the label. NOTE: This sheet is a summary. It may not cover all possible information. If you have questions about this medicine, talk to your doctor, pharmacist, or health care provider.  2018 Elsevier/[employer] Standard (2014-04-09 12:36:41)

## 2016-10-26 NOTE — Telephone Encounter (Signed)
Pt experiencing 6/10 gluteal pain. No orders received from IR, nurse told to call Dr. Irene Limbo. VO received for 0.2mg  dilaudid.

## 2016-10-27 ENCOUNTER — Ambulatory Visit (HOSPITAL_BASED_OUTPATIENT_CLINIC_OR_DEPARTMENT_OTHER): Payer: Medicare Other

## 2016-10-27 VITALS — BP 155/71 | HR 81 | Temp 97.9°F | Resp 20

## 2016-10-27 DIAGNOSIS — Z5189 Encounter for other specified aftercare: Secondary | ICD-10-CM | POA: Diagnosis not present

## 2016-10-27 DIAGNOSIS — C8338 Diffuse large B-cell lymphoma, lymph nodes of multiple sites: Secondary | ICD-10-CM

## 2016-10-27 MED ORDER — PEGFILGRASTIM INJECTION 6 MG/0.6ML ~~LOC~~
6.0000 mg | PREFILLED_SYRINGE | Freq: Once | SUBCUTANEOUS | Status: AC
  Administered 2016-10-27: 6 mg via SUBCUTANEOUS
  Filled 2016-10-27: qty 0.6

## 2016-10-27 NOTE — Patient Instructions (Signed)
Pegfilgrastim injection What is this medicine? PEGFILGRASTIM (PEG fil gra stim) is a long-acting granulocyte colony-stimulating factor that stimulates the growth of neutrophils, a type of white blood cell important in the body's fight against infection. It is used to reduce the incidence of fever and infection in patients with certain types of cancer who are receiving chemotherapy that affects the bone marrow, and to increase survival after being exposed to high doses of radiation. This medicine may be used for other purposes; ask your health care provider or pharmacist if you have questions. COMMON BRAND NAME(S): Neulasta What should I tell my health care provider before I take this medicine? They need to know if you have any of these conditions: -kidney disease -latex allergy -ongoing radiation therapy -sickle cell disease -skin reactions to acrylic adhesives (On-Body Injector only) -an unusual or allergic reaction to pegfilgrastim, filgrastim, other medicines, foods, dyes, or preservatives -pregnant or trying to get pregnant -breast-feeding How should I use this medicine? This medicine is for injection under the skin. If you get this medicine at home, you will be taught how to prepare and give the pre-filled syringe or how to use the On-body Injector. Refer to the patient Instructions for Use for detailed instructions. Use exactly as directed. Tell your healthcare provider immediately if you suspect that the On-body Injector may not have performed as intended or if you suspect the use of the On-body Injector resulted in a missed or partial dose. It is important that you put your used needles and syringes in a special sharps container. Do not put them in a trash can. If you do not have a sharps container, call your pharmacist or healthcare provider to get one. Talk to your pediatrician regarding the use of this medicine in children. While this drug may be prescribed for selected conditions,  precautions do apply. Overdosage: If you think you have taken too much of this medicine contact a poison control center or emergency room at once. NOTE: This medicine is only for you. Do not share this medicine with others. What if I miss a dose? It is important not to miss your dose. Call your doctor or health care professional if you miss your dose. If you miss a dose due to an On-body Injector failure or leakage, a new dose should be administered as soon as possible using a single prefilled syringe for manual use. What may interact with this medicine? Interactions have not been studied. Give your health care provider a list of all the medicines, herbs, non-prescription drugs, or dietary supplements you use. Also tell them if you smoke, drink alcohol, or use illegal drugs. Some items may interact with your medicine. This list may not describe all possible interactions. Give your health care provider a list of all the medicines, herbs, non-prescription drugs, or dietary supplements you use. Also tell them if you smoke, drink alcohol, or use illegal drugs. Some items may interact with your medicine. What should I watch for while using this medicine? You may need blood work done while you are taking this medicine. If you are going to need a MRI, CT scan, or other procedure, tell your doctor that you are using this medicine (On-Body Injector only). What side effects may I notice from receiving this medicine? Side effects that you should report to your doctor or health care professional as soon as possible: -allergic reactions like skin rash, itching or hives, swelling of the face, lips, or tongue -dizziness -fever -pain, redness, or irritation at site   where injected -pinpoint red spots on the skin -red or dark-brown urine -shortness of breath or breathing problems -stomach or side pain, or pain at the shoulder -swelling -tiredness -trouble passing urine or change in the amount of urine Side  effects that usually do not require medical attention (report to your doctor or health care professional if they continue or are bothersome): -bone pain -muscle pain This list may not describe all possible side effects. Call your doctor for medical advice about side effects. You may report side effects to FDA at 1-800-FDA-1088. Where should I keep my medicine? Keep out of the reach of children. Store pre-filled syringes in a refrigerator between 2 and 8 degrees C (36 and 46 degrees F). Do not freeze. Keep in carton to protect from light. Throw away this medicine if it is left out of the refrigerator for more than 48 hours. Throw away any unused medicine after the expiration date. NOTE: This sheet is a summary. It may not cover all possible information. If you have questions about this medicine, talk to your doctor, pharmacist, or health care provider.  2018 Elsevier/Gold Standard (2015-12-16 12:58:03)  

## 2016-10-30 ENCOUNTER — Telehealth: Payer: Self-pay | Admitting: Hematology

## 2016-10-30 ENCOUNTER — Ambulatory Visit (HOSPITAL_COMMUNITY)
Admission: RE | Admit: 2016-10-30 | Discharge: 2016-10-30 | Disposition: A | Discharge status: Home / Self Care | Payer: Medicare Other | Source: Ambulatory Visit | Attending: Radiology | Admitting: Radiology

## 2016-10-30 ENCOUNTER — Ambulatory Visit (HOSPITAL_COMMUNITY)
Admission: RE | Admit: 2016-10-30 | Discharge: 2016-10-30 | Disposition: A | Discharge status: Home / Self Care | Payer: Medicare Other | Source: Ambulatory Visit | Attending: Hematology | Admitting: Hematology

## 2016-10-30 DIAGNOSIS — C8338 Diffuse large B-cell lymphoma, lymph nodes of multiple sites: Secondary | ICD-10-CM | POA: Diagnosis not present

## 2016-10-30 DIAGNOSIS — J90 Pleural effusion, not elsewhere classified: Secondary | ICD-10-CM | POA: Diagnosis present

## 2016-10-30 LAB — GLUCOSE, PLEURAL OR PERITONEAL FLUID: GLUCOSE FL: 97 mg/dL

## 2016-10-30 LAB — LACTATE DEHYDROGENASE, PLEURAL OR PERITONEAL FLUID: LD, Fluid: 109 U/L — ABNORMAL HIGH (ref 3–23)

## 2016-10-30 LAB — GRAM STAIN

## 2016-10-30 LAB — PROTEIN, PLEURAL OR PERITONEAL FLUID

## 2016-10-30 MED ORDER — LIDOCAINE HCL 2 % IJ SOLN
INTRAMUSCULAR | Status: AC
  Filled 2016-10-30: qty 10

## 2016-10-30 NOTE — Procedures (Signed)
PROCEDURE SUMMARY:  Successful US guided right thoracentesis. Yielded 950 mL of chylous/milky fluid. Pt tolerated procedure well. No immediate complications.  Specimen was sent for labs. CXR ordered.  Ascencion Dike PA-C 10/30/2016 10:55 AM

## 2016-10-30 NOTE — Telephone Encounter (Signed)
No additional appts to scheduled per 10/24 - appts already scheduled. - Referral is in correct workque - patient to get call from referring office.

## 2016-10-31 ENCOUNTER — Telehealth: Payer: Self-pay | Admitting: Hematology

## 2016-10-31 ENCOUNTER — Other Ambulatory Visit: Payer: Self-pay | Admitting: Hematology

## 2016-10-31 NOTE — Telephone Encounter (Signed)
Called an confirmed appt with Dr. Dorris Carnes - patient is aware of apt date and time.

## 2016-11-01 ENCOUNTER — Other Ambulatory Visit: Payer: Self-pay | Admitting: Radiology

## 2016-11-01 LAB — PH, BODY FLUID: pH, Body Fluid: 7.5

## 2016-11-03 ENCOUNTER — Other Ambulatory Visit (HOSPITAL_COMMUNITY): Payer: Self-pay | Admitting: Diagnostic Radiology

## 2016-11-03 ENCOUNTER — Ambulatory Visit (HOSPITAL_COMMUNITY)
Admission: RE | Admit: 2016-11-03 | Discharge: 2016-11-03 | Disposition: A | Discharge status: Home / Self Care | Payer: Medicare Other | Source: Ambulatory Visit | Attending: Diagnostic Radiology | Admitting: Diagnostic Radiology

## 2016-11-03 ENCOUNTER — Encounter (HOSPITAL_COMMUNITY): Payer: Self-pay

## 2016-11-03 DIAGNOSIS — C8330 Diffuse large B-cell lymphoma, unspecified site: Secondary | ICD-10-CM | POA: Insufficient documentation

## 2016-11-03 DIAGNOSIS — K219 Gastro-esophageal reflux disease without esophagitis: Secondary | ICD-10-CM | POA: Diagnosis not present

## 2016-11-03 DIAGNOSIS — Z7951 Long term (current) use of inhaled steroids: Secondary | ICD-10-CM | POA: Insufficient documentation

## 2016-11-03 DIAGNOSIS — C8590 Non-Hodgkin lymphoma, unspecified, unspecified site: Secondary | ICD-10-CM

## 2016-11-03 DIAGNOSIS — T82598A Other mechanical complication of other cardiac and vascular devices and implants, initial encounter: Secondary | ICD-10-CM | POA: Diagnosis not present

## 2016-11-03 DIAGNOSIS — Z7952 Long term (current) use of systemic steroids: Secondary | ICD-10-CM | POA: Diagnosis not present

## 2016-11-03 DIAGNOSIS — Z87891 Personal history of nicotine dependence: Secondary | ICD-10-CM | POA: Insufficient documentation

## 2016-11-03 DIAGNOSIS — Y713 Surgical instruments, materials and cardiovascular devices (including sutures) associated with adverse incidents: Secondary | ICD-10-CM | POA: Diagnosis not present

## 2016-11-03 HISTORY — PX: IR REMOVAL TUN CV CATH W/O FL: IMG2289

## 2016-11-03 HISTORY — PX: IR FLUORO GUIDE PORT INSERTION RIGHT: IMG5741

## 2016-11-03 HISTORY — PX: IR US GUIDE VASC ACCESS RIGHT: IMG2390

## 2016-11-03 LAB — BASIC METABOLIC PANEL
Anion gap: 6 (ref 5–15)
BUN: 7 mg/dL (ref 6–20)
CALCIUM: 8.6 mg/dL — AB (ref 8.9–10.3)
CO2: 30 mmol/L (ref 22–32)
CREATININE: 0.77 mg/dL (ref 0.44–1.00)
Chloride: 104 mmol/L (ref 101–111)
GFR calc non Af Amer: >60 mL/min (ref >60)
Glucose, Bld: 101 mg/dL — ABNORMAL HIGH (ref 65–99)
Potassium: 3.1 mmol/L — ABNORMAL LOW (ref 3.5–5.1)
SODIUM: 140 mmol/L (ref 135–145)

## 2016-11-03 LAB — CBC WITH DIFFERENTIAL/PLATELET
BASOS PCT: 2 %
Basophils Absolute: 0 10*3/uL (ref 0.0–0.1)
EOS ABS: 0 10*3/uL (ref 0.0–0.7)
Eosinophils Relative: 2 %
HCT: 28.7 % — ABNORMAL LOW (ref 36.0–46.0)
Hemoglobin: 9.2 g/dL — ABNORMAL LOW (ref 12.0–15.0)
LYMPHS ABS: 0.4 10*3/uL — AB (ref 0.7–4.0)
Lymphocytes Relative: 31 %
MCH: 27.9 pg (ref 26.0–34.0)
MCHC: 32.1 g/dL (ref 30.0–36.0)
MCV: 87 fL (ref 78.0–100.0)
MONO ABS: 0.4 10*3/uL (ref 0.1–1.0)
Monocytes Relative: 27 %
NEUTROS PCT: 38 %
Neutro Abs: 0.6 10*3/uL — ABNORMAL LOW (ref 1.7–7.7)
PLATELETS: 120 10*3/uL — AB (ref 150–400)
RBC: 3.3 MIL/uL — ABNORMAL LOW (ref 3.87–5.11)
RDW: 15.6 % — AB (ref 11.5–15.5)
WBC: 1.4 10*3/uL — AB (ref 4.0–10.5)

## 2016-11-03 LAB — PATHOLOGIST SMEAR REVIEW

## 2016-11-03 LAB — PROTIME-INR
INR: 1.09
PROTHROMBIN TIME: 14 s (ref 11.4–15.2)

## 2016-11-03 MED ORDER — IOPAMIDOL (ISOVUE-300) INJECTION 61%
INTRAVENOUS | Status: AC
  Filled 2016-11-03: qty 100

## 2016-11-03 MED ORDER — LIDOCAINE HCL 1 % IJ SOLN
INTRAMUSCULAR | Status: AC
  Filled 2016-11-03: qty 20

## 2016-11-03 MED ORDER — FENTANYL CITRATE (PF) 100 MCG/2ML IJ SOLN
INTRAMUSCULAR | Status: AC | PRN
  Administered 2016-11-03 (×4): 50 ug via INTRAVENOUS

## 2016-11-03 MED ORDER — CEFAZOLIN SODIUM-DEXTROSE 2-4 GM/100ML-% IV SOLN
INTRAVENOUS | Status: AC
  Filled 2016-11-03: qty 100

## 2016-11-03 MED ORDER — FENTANYL CITRATE (PF) 100 MCG/2ML IJ SOLN
INTRAMUSCULAR | Status: AC
  Filled 2016-11-03: qty 4

## 2016-11-03 MED ORDER — MIDAZOLAM HCL 2 MG/2ML IJ SOLN
INTRAMUSCULAR | Status: AC | PRN
  Administered 2016-11-03 (×4): 1 mg via INTRAVENOUS

## 2016-11-03 MED ORDER — KCL-LACTATED RINGERS-D5W 20 MEQ/L IV SOLN
INTRAVENOUS | Status: DC
  Administered 2016-11-03: 13:00:00 via INTRAVENOUS
  Filled 2016-11-03: qty 1000

## 2016-11-03 MED ORDER — SODIUM CHLORIDE 0.9 % IV SOLN
INTRAVENOUS | Status: DC
  Administered 2016-11-03: 10:00:00 via INTRAVENOUS

## 2016-11-03 MED ORDER — HEPARIN SOD (PORK) LOCK FLUSH 100 UNIT/ML IV SOLN
INTRAVENOUS | Status: AC
  Filled 2016-11-03: qty 5

## 2016-11-03 MED ORDER — LIDOCAINE HCL (PF) 1 % IJ SOLN
INTRAMUSCULAR | Status: AC | PRN
  Administered 2016-11-03: 20 mL

## 2016-11-03 MED ORDER — LIDOCAINE-EPINEPHRINE (PF) 2 %-1:200000 IJ SOLN
INTRAMUSCULAR | Status: AC
  Filled 2016-11-03: qty 20

## 2016-11-03 MED ORDER — HEPARIN SOD (PORK) LOCK FLUSH 100 UNIT/ML IV SOLN
INTRAVENOUS | Status: AC | PRN
  Administered 2016-11-03: 500 [IU] via INTRAVENOUS

## 2016-11-03 MED ORDER — CEFAZOLIN SODIUM-DEXTROSE 2-4 GM/100ML-% IV SOLN
2.0000 g | INTRAVENOUS | Status: AC
  Administered 2016-11-03: 2 g via INTRAVENOUS

## 2016-11-03 MED ORDER — MIDAZOLAM HCL 2 MG/2ML IJ SOLN
INTRAMUSCULAR | Status: AC
  Filled 2016-11-03: qty 4

## 2016-11-03 NOTE — Procedures (Signed)
Right chest port was completely removed and a new right jugular port was placed.  Larger profile port was used.  Tip at SVC/RA junction and working well.  Minimal blood loss and no immediate complication.

## 2016-11-03 NOTE — H&P (Signed)
Chief Complaint: Patient was seen in consultation today lymphoma  Referring Physician(s): Henn,Adam  Supervising Physician: Markus Daft  Patient Status: [facility] - Out-pt  History of Present Illness: Jo Collins is a 66 y.o. female with past medical history of GERD and non-hodgkin's lymphoma who currently undergoing chemotherapy via Port-A-Cath placed 08/21/16 by Dr. Earleen Newport.  Patient was able to get initial treatment, however her last infusion was difficult and the Port was felt to be clogged.   She presented for line injection 10/26/16 which showed: IMPRESSION: Catheter tip is against the upper SVC wall and associated with a large fibrin sheath.  She returns to radiology department today for stripping procedure and possible replacement if unsuccessful.   Patient has been NPO. She does not take blood thinners.  She denies fever, chills, cough, abdominal pain, nausea, or other signs of acute infection.   Past Medical History:  Diagnosis Date  . GERD (gastroesophageal reflux disease)   . NHL (non-Hodgkin's lymphoma) [facility])     Past Surgical History:  Procedure Laterality Date  . ABDOMINAL HYSTERECTOMY    . IR CV LINE INJECTION  10/26/2016  . IR FLUORO GUIDE PORT INSERTION RIGHT  08/18/2016  . IR THORACENTESIS ASP PLEURAL SPACE W/IMG GUIDE  08/07/2016  . IR THORACENTESIS ASP PLEURAL SPACE W/IMG GUIDE  08/14/2016  . IR US GUIDE VASC ACCESS RIGHT  08/18/2016    Allergies: Patient has no known allergies.  Medications: Prior to Admission medications   Medication Sig Start Date End Date Taking? Authorizing Provider  acetaminophen (TYLENOL) 500 MG tablet Take 1,000 mg by mouth every 6 (six) hours as needed for moderate pain.   Yes [provider]  benzonatate (TESSALON) 100 MG capsule Take 1 capsule (100 mg total) by mouth 3 (three) times daily as needed for cough. 10/04/16  Yes Brunetta Genera, MD  dexamethasone (DECADRON) 4 MG tablet Take 2 tablets (8 mg total) by  mouth once as needed. Take 45-60 mins prior to Intrathecal methotrexate 10/25/16  Yes Brunetta Genera, MD  diphenhydrAMINE (BENADRYL) 25 MG tablet Take 25-50 mg by mouth daily as needed for allergies (chemotherapy infusion).    Yes [provider]  fluticasone (FLONASE) 50 MCG/ACT nasal spray Place 2 sprays into both nostrils daily. 10/10/16  Yes Eugenie Filler, MD  furosemide (LASIX) 20 MG tablet Take 1 tablet (20 mg total) by mouth daily. 10/11/16  Yes Eugenie Filler, MD  loratadine (CLARITIN) 10 MG tablet Take 10 mg by mouth daily as needed for allergies.   Yes [provider]  Multiple Vitamin (MULTIVITAMIN WITH MINERALS) TABS tablet Take 1 tablet by mouth daily.   Yes [provider]  ondansetron (ZOFRAN) 4 MG tablet Take 4 mg by mouth every 8 (eight) hours as needed for nausea or vomiting.   Yes [provider]  polyethylene glycol (MIRALAX / GLYCOLAX) packet Take 17 g by mouth daily. 08/15/16  Yes Dhungel, Nishant, MD  prochlorperazine (COMPAZINE) 10 MG tablet Take 1 tablet (10 mg total) by mouth every 6 (six) hours as needed (Nausea or vomiting). 08/22/16  Yes Brunetta Genera, MD  traZODone (DESYREL) 50 MG tablet Take 1 tablet (50 mg total) by mouth at bedtime as needed for sleep. 10/04/16  Yes Brunetta Genera, MD  HYDROcodone-homatropine [facility]) 5-1.5 MG/5ML syrup Take 5 mLs by mouth 3 (three) times daily. 10/10/16   Eugenie Filler, MD  lidocaine-prilocaine (EMLA) cream Apply 1 application topically as needed. Apply to port-a-cath assess site 45-60 mins  prior to chemotherapy. 08/22/16   Brunetta Genera, MD  predniSONE (DELTASONE) 20 MG tablet Take 3 tablets (60 mg total) by mouth daily. Take on days 1-5 of chemotherapy. 10/04/16   Brunetta Genera, MD  vitamin B-12 500 MCG tablet Take 1 tablet (500 mcg total) by mouth daily. Patient taking differently: Take 500 mcg by mouth every other day.  08/16/16   Dhungel, Flonnie Overman, MD      Family History  Problem Relation Age of Onset  . COPD Mother   . Diabetes Mellitus II Sister   . Diabetes Mellitus II Brother   . Diabetes Mellitus II Sister   . Stroke Sister   . Hypertension Sister   . Diabetes Mellitus II Sister     Social History   Social History  . Marital status: Married    Spouse name: N/A  . Number of children: N/A  . Years of education: N/A   Social History Main Topics  . Smoking status: Former Smoker    Packs/day: 0.50    Years: 47.00    Types: Cigarettes    Quit date: 07/28/2016  . Smokeless tobacco: Never Used  . Alcohol use No  . Drug use: No  . Sexual activity: Not Asked   Other Topics Concern  . None   Social History Narrative  . None    Review of Systems  Constitutional: Negative for fatigue and fever.  Respiratory: Negative for cough and shortness of breath.   Cardiovascular: Negative for chest pain.  Gastrointestinal: Negative for abdominal pain.  Psychiatric/Behavioral: Negative for behavioral problems and confusion.    Vital Signs: There were no vitals taken for this visit.  Physical Exam  Constitutional: She is oriented to person, place, and time. She appears well-developed.  Cardiovascular: Normal rate, regular rhythm and normal heart sounds.   Pulmonary/Chest: Effort normal and breath sounds normal. No respiratory distress.  Abdominal: Soft.  Neurological: She is alert and oriented to person, place, and time.  Skin: Skin is warm and dry.  Psychiatric: She has a normal mood and affect. Her behavior is normal. Judgment and thought content normal.  Nursing note and vitals reviewed.   Imaging: Dg Chest 1 View  Result Date: 10/30/2016 CLINICAL DATA:  Right anterior shoulder pain. Post right thoracentesis EXAM: CHEST 1 VIEW COMPARISON:  10/09/2016 FINDINGS: Right Port-A-Cath remains in place, unchanged. No pneumothorax following right thoracentesis. Small right pleural effusion with right lower lobe airspace disease.  Mild cardiomegaly. Left lung clear. IMPRESSION: No pneumothorax following right thoracentesis. Small right effusion and right lower lobe airspace disease again noted, unchanged. Electronically Signed   By: Rolm Baptise M.D.   On: 10/30/2016 10:57   Dg Chest 1 View  Result Date: 10/09/2016 CLINICAL DATA:  Status post right-sided thoracentesis. EXAM: CHEST 1 VIEW COMPARISON:  Radiographs of October 07, 2016. FINDINGS: Stable cardiomediastinal silhouette. Right pleural effusion is significantly smaller status post thoracentesis. No pneumothorax is noted. Left lung is clear. Right internal jugular Port-A-Cath is unchanged in position. Bony thorax is unremarkable. IMPRESSION: Right pleural effusion is significantly smaller status post thoracentesis. No pneumothorax is noted. Electronically Signed   By: Marijo Conception, M.D.   On: 10/09/2016 10:27   Dg Chest 2 View  Result Date: 10/07/2016 CLINICAL DATA:  Shortness of breath, onset today. Cancer in the spine and esophagus. Recent thoracentesis 1 week ago. Former smoker. EXAM: CHEST  2 VIEW COMPARISON:  09/27/2016 FINDINGS: Large right pleural effusion, increased since previous study. Atelectasis or consolidation in  the right lung base. Left lung is clear. Heart size and pulmonary vascularity are normal. Right central venous catheter with tip over the mid SVC region. No pneumothorax. Degenerative changes in the spine. IMPRESSION: Large right pleural effusion increased since previous study. Consolidation or atelectasis in the right lung base. Left lung is clear. Electronically Signed   By: Lucienne Capers M.D.   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 EXAM: NUCLEAR MEDICINE PET SKULL BASE TO THIGH TECHNIQUE: 12.7 mCi F-18 FDG was injected intravenously. Full-ring PET imaging was performed from the skull base to  thigh after the radiotracer. CT data was obtained and used for attenuation correction and anatomic localization. FASTING BLOOD GLUCOSE:  Value: 106 mg/dl COMPARISON:  PET-CT August 19, 2016 FINDINGS: NECK: Uptake in the right side of the mandible is likely odontogenic. No FDG avid disease identified in the head or neck. There is mild muscular uptake in the left side of the neck. CHEST: The hypermetabolic paraspinal mass on the previous study has significantly improved with only mild scattered soft tissue in this region today. The patchy nature of soft tissue remaining makes it difficult to measure accurately. However, the mass measured 8.6 x 9.4 cm previously and measures up to 4.7 by 2.1 cm on series 4, image 87 of today's study. There is low level uptake remaining in this region today with a maximum SUV of 5.78 today versus 17.7 previously. The FDG avid pleural nodularity seen on the previous study has resolved. The 3 x 1.3 cm pleural nodule medially on the right on series 4, image 62 of the previous study is no longer seen today. The juxta diaphragmatic lymph node described on the previous study has resolved as well. No other soft tissue disease seen in the chest on today's study. Mediastinal blood pool demonstrates an SUV of 5.5. ABDOMEN/PELVIS: No abnormal uptake in the parenchymal organs of the abdomen or pelvis. Specifically, no hepatic or splenic uptake noted. Physiologic uptake is seen in the renal collecting system and bowel. The gastrohepatic ligament node described on the previous study is smaller in the interval measuring 8 mm today versus 12 mm previously with a maximum SUV of 3.4 today versus 4.9 previously. The right external iliac lymph node measuring 2.4 cm on the previous study measures 1.9 cm on series 4, image 161 today. The maximum SUV is 8.2 today versus 9.3 previously. The previously described right inguinal node has resolved in the interval and is no longer clearly visualized with no  abnormal FDG uptake in this region today. No other FDG avid disease is seen in the abdomen or pelvis. Liver blood pool demonstrates a maximum SUV of 6.0. SKELETON: There is diffuse uptake throughout the bones which is mildly heterogeneous. I suspect the majority of this uptake is due to recent chemotherapy and supportive measures. The abnormal uptake in the left humeral shaft on the previous study does percent with a maximum SUV of 8.5 today versus 10.8 previously. This uptake is less conspicuous today however due to the diffuse marrow uptake. The uptake in T7 and T8 is now similar to to the diffuse marrow uptake seen at multiple levels. INCIDENTAL FINDINGS: A right pleural effusion remains. The right Port-A-Cath is in good position. IMPRESSION: 1. There has been significant interval improvement in the chest, abdomen, and pelvis in the interval. The dominant mass  in the chest is much smaller and less FDG avid in the interval. However, the remaining maximum uptake is slightly greater than mediastinal blood pool. The dominant remaining disease in the pelvis is an external iliac lymph node with a maximum SUV moderately greater than liver blood pool. Evaluation of the bones is more difficult today given the diffuse uptake likely associated with recent chemotherapy. I suspect the previously identified humeral lesion remains. Recommend continued attention to the bones on follow-up. Deauville Category 4 Electronically Signed   By: Dorise Bullion III M.D   On: 10/22/2016 16:32   Ir Cv Line Injection  Result Date: 10/26/2016 INDICATION: 66 year old with non-Hodgkin's lymphoma. Port-A-Cath is not aspirating. EXAM: PORT-A-CATH INJECTION WITH FLUOROSCOPY Physician: Stephan Minister. Anselm Pancoast, MD FLUOROSCOPY TIME:  79.48 mGy MEDICATIONS: None ANESTHESIA/SEDATION: None PROCEDURE: Patient was supine on the fluoroscopic table. The Port-A-Cath was injected with 15 mL of contrast under fluoroscopic evaluation. Catheter was flushed after the  contrast injection. FINDINGS: Right jugular Port-A-Cath has slightly pulled back and appears to be in the upper SVC. Part of the catheter is coiled within the right neck soft tissues. The catheter tip appears to be against the vein wall near the azygos vein. Some contrast was actually filling the azygos vein. COMPLICATIONS: None IMPRESSION: Catheter tip is against the upper SVC wall and associated with a large fibrin sheath. PLAN: Recommend attempting a catheter stripping procedure. If that procedure is unsuccessful than the port will either need to be removed or revised. Electronically Signed   By: Markus Daft M.D.   On: 10/26/2016 14:36   Dg Fluoro Guide Spinal/si Jt Inj Left  Result Date: 10/26/2016 CLINICAL DATA:  Diffuse large B-cell lymphoma. For intrathecal methotrexate. EXAM: FLUOROSCOPICALLY GUIDED LUMBAR PUNCTURE FOR INTRATHECAL CHEMOTHERAPY TECHNIQUE: Informed consent was obtained from the patient prior to the procedure, including potential complications of headache, allergy, and pain. A 'time out' was performed. With the patient prone, the lower back was prepped with Betadine. 1% Lidocaine was used for local anesthesia. Lumbar puncture was performed at the L2-3 in using a 22 gauge needle with return of clear CSF. Pharmacy prepared solution of methotrexate and hydrocortisone was injected into the subarachnoid space. The patient has a history of severe nausea following the previous intrathecal injection. 12mg  of methotrexate was injected into the subarachnoid space. The patient tolerated the procedure well without apparent complication. FLUOROSCOPY TIME:  0 minutes 18 seconds IMPRESSION: Intrathecal injection of chemotherapy without complication Electronically Signed   By: Franchot Gallo M.D.   On: 10/26/2016 13:08   US Thoracentesis Asp Pleural Space W/img Guide  Result Date: 10/30/2016 INDICATION: History of lymphoma. Recurrent right pleural effusion. Request diagnostic and therapeutic  thoracentesis. EXAM: ULTRASOUND GUIDED RIGHT THORACENTESIS MEDICATIONS: None. COMPLICATIONS: None immediate. Postprocedural chest x-ray negative for pneumothorax. PROCEDURE: An ultrasound guided thoracentesis was thoroughly discussed with the patient and questions answered. The benefits, risks, alternatives and complications were also discussed. The patient understands and wishes to proceed with the procedure. Written consent was obtained. Ultrasound was performed to localize and mark an adequate pocket of fluid in the right chest. The area was then prepped and draped in the normal sterile fashion. 1% Lidocaine was used for local anesthesia. Under ultrasound guidance a T centesis catheter was introduced. Thoracentesis was performed. The catheter was removed and a dressing applied. FINDINGS: A total of approximately 950 mL of chylous fluid was removed. Samples were sent to the laboratory as requested by the clinical team. IMPRESSION: Successful ultrasound guided right thoracentesis yielding  950 mL of pleural fluid. Read by: Ascencion Dike PA-C Electronically Signed   By: Sandi Mariscal M.D.   On: 10/30/2016 11:00   US Thoracentesis Asp Pleural Space W/img Guide  Result Date: 10/09/2016 INDICATION: Patient with a history of non-Hodgkin's lymphoma with recurrent right pleural effusion. Request is made for diagnostic and therapeutic thoracentesis. EXAM: ULTRASOUND GUIDED DIAGNOSTIC AND THERAPEUTIC THORACENTESIS MEDICATIONS: 2% xylocaine COMPLICATIONS: None immediate. PROCEDURE: An ultrasound guided thoracentesis was thoroughly discussed with the patient and questions answered. The benefits, risks, alternatives and complications were also discussed. The patient understands and wishes to proceed with the procedure. Written consent was obtained. Ultrasound was performed to localize and mark an adequate pocket of fluid in the right chest. The area was then prepped and draped in the normal sterile fashion. 2% xylocaine was  used for local anesthesia. Under ultrasound guidance a Safe-T-Centesis catheter was introduced. Thoracentesis was performed. The catheter was removed and a dressing applied. FINDINGS: A total of approximately 1.7 L of pain, chylous colored fluid was removed. Samples were sent to the laboratory as requested by the clinical team. IMPRESSION: Successful ultrasound guided right thoracentesis yielding 1.7 L of pleural fluid. Procedure terminated slightly early secondary to excessive coughing. Postprocedure chest radiograph shows no pneumothorax. Read by: Saverio Danker, PA-C Electronically Signed   By: Lucrezia Europe M.D.   On: 10/09/2016 11:20   Dg Fluoro Guided Loc Of Needle/cath Tip For Spinal Inject Lt  Result Date: 10/05/2016 CLINICAL DATA:  DIFFUSE LARGE B-CELL LYMPHOMA EXAM: FLUOROSCOPICALLY GUIDED LUMBAR PUNCTURE FOR INTRATHECAL CHEMOTHERAPY DIAGNOSTIC LUMBAR PUNCTURE UNDER FLUOROSCOPIC GUIDANCE TECHNIQUE: I discussed the risks (including hemorrhage, infection, headache, and nerve damage, among others), benefits, and alternatives to fluoroscopically guided lumbar puncture with intrathecal methotrexate administration with the patient. We specifically discussed the technical likelihood of success of the procedure. The patient understood and elected to undergo the procedure. Standard time-out was employed. Following sterile skin prep and local anesthetic administration consisting of 1 percent lidocaine, a 22 gauge 5 inch spinal needle was advanced in the midline at L2-3. It might be feasible to use a 4.5 inch needle rather than the 5 inch, but the standard 3.5 inch in length needle would not be adequate due to body habitus. There was a very small midline window that was difficult to access. Accordingly I turned the patient slightly an used a paramedian approach at the same level. The needle was advanced difficulty into the thecal sac. Clear CSF was returned. Opening pressure was not requested or obtained. 12 cc of  clear CSF was collected. CSF flow was slow but steady. Slow rate of flow partially attributable to the long needle length and 22 gauge size. Subsequently the medication syringe containing 12 mg of methotrexate was attached and very slowly injected. The patient did note mild left buttock discomfort during injection. I removed the syringe and there was some backflow of medication, suggesting that the positioning of the needle tip was still intrathecal. Also, the injection was proceeding easily without significant pressure required to inject, also typical of intrathecal positioning. After all of the medication have been administered, the needle was removed and the skin cleansed and bandaged. Aside from the mild left buttock discomfort common no immediate complications were observed. FLUOROSCOPY TIME:  1 minutes, 16 seconds IMPRESSION: 1. Technically successful fluoroscopically guided lumbar puncture with intrathecal methotrexate administration. 12 cc of clear CSF were obtained for diagnostic purposes. The patient did note mild left buttock discomfort after the exam, we will continue to monitor during  her observation. Electronically Signed   By: Van Clines M.D.   On: 10/05/2016 13:37    Labs:  CBC:  Recent Labs  10/09/16 0402 10/10/16 0850 10/25/16 0907 11/03/16 1030  WBC 40.4* 14.6* 7.8 1.4*  HGB 9.8* 10.0* 10.0* 9.2*  HCT 30.5* 30.9* 32.2* 28.7*  PLT 180 137* 306 120*    COAGS:  Recent Labs  08/07/16 0442 08/18/16 1057 09/25/16 1810 11/03/16 1030  INR 1.09 1.16 1.10 1.09  APTT  --  28 28  --     BMP:  Recent Labs  10/08/16 1028 10/09/16 0402 10/10/16 0850 10/25/16 0907 11/03/16 1030  NA 140 140 138 143 140  K 3.2* 3.9 3.8 3.7 3.1*  CL 105 106 103  --  104  CO2 24 26 28 27 30   GLUCOSE 149* 103* 111* 116 101*  BUN 20 20 19  8.8 7  CALCIUM 9.0 8.9 8.8* 9.1 8.6*  CREATININE 0.91 0.79 0.83 0.8 0.77  GFRNONAA >60 >60 >60  --  >60  GFRAA >60 >60 >60  --  >60    LIVER  FUNCTION TESTS:  Recent Labs  10/04/16 0810 10/08/16 1028 10/09/16 0402 10/25/16 0907  BILITOT 0.31 0.6 0.6 0.40  AST 21 32 29 15  ALT 20 33 38 11  ALKPHOS 51 47 54 45  PROT 6.6 6.3* 6.0* 6.4  ALBUMIN 3.0* 3.3* 3.1* 2.9*    TUMOR MARKERS: No results for input(s): AFPTM, CEA, CA199, CHROMGRNA in the last 8760 hours.  Assessment and Plan: Patient with past medical history of lymphoma presents with complaint of malfunctioning Port-A-Cath.  Case reviewed by Dr. Anselm Pancoast who recently saw patient for line injection which demonstrated fibrin sheath and approves patient for procedure.  Patient presents today in their usual state of health.  She has been NPO and is not currently on blood thinners.  Risks and benefits discussed with the patient including, but not limited to bleeding, infection, pneumothorax, or fibrin sheath development and need for additional procedures. All of the patient's questions were answered, patient is agreeable to proceed. Consent signed and in chart.   Thank you for this interesting consult.  I greatly enjoyed meeting Danaja Lasota and look forward to participating in their care.  A copy of this report was sent to the requesting provider on this date.  Electronically Signed: Docia Barrier, PA 11/03/2016, 12:00 PM   I spent a total of    25 Minutes in face to face in clinical consultation, greater than 50% of which was counseling/coordinating care for lymphoma

## 2016-11-03 NOTE — Discharge Instructions (Signed)
Implanted Port Home Guide °An implanted port is a type of central line that is placed under the skin. Central lines are used to provide IV access when treatment or nutrition needs to be given through a person’s veins. Implanted ports are used for long-term IV access. An implanted port may be placed because: °· You need IV medicine that would be irritating to the small veins in your hands or arms. °· You need long-term IV medicines, such as antibiotics. °· You need IV nutrition for a long period. °· You need frequent blood draws for lab tests. °· You need dialysis. ° °Implanted ports are usually placed in the chest area, but they can also be placed in the upper arm, the abdomen, or the leg. An implanted port has two main parts: °· Reservoir. The reservoir is round and will appear as a small, raised area under your skin. The reservoir is the part where a needle is inserted to give medicines or draw blood. °· Catheter. The catheter is a thin, flexible tube that extends from the reservoir. The catheter is placed into a large vein. Medicine that is inserted into the reservoir goes into the catheter and then into the vein. ° °How will I care for my incision site? °Do not get the incision site wet. Bathe or shower as directed by your health care provider. °How is my port accessed? °Special steps must be taken to access the port: °· Before the port is accessed, a numbing cream can be placed on the skin. This helps numb the skin over the port site. °· Your health care provider uses a sterile technique to access the port. °? Your health care provider must put on a mask and sterile gloves. °? The skin over your port is cleaned carefully with an antiseptic and allowed to dry. °? The port is gently pinched between sterile gloves, and a needle is inserted into the port. °· Only "non-coring" port needles should be used to access the port. Once the port is accessed, a blood return should be checked. This helps ensure that the port  is in the vein and is not clogged. °· If your port needs to remain accessed for a constant infusion, a clear (transparent) bandage will be placed over the needle site. The bandage and needle will need to be changed every week, or as directed by your health care provider. °· Keep the bandage covering the needle clean and dry. Do not get it wet. Follow your health care provider’s instructions on how to take a shower or bath while the port is accessed. °· If your port does not need to stay accessed, no bandage is needed over the port. ° °What is flushing? °Flushing helps keep the port from getting clogged. Follow your health care provider’s instructions on how and when to flush the port. Ports are usually flushed with saline solution or a medicine called heparin. The need for flushing will depend on how the port is used. °· If the port is used for intermittent medicines or blood draws, the port will need to be flushed: °? After medicines have been given. °? After blood has been drawn. °? As part of routine maintenance. °· If a constant infusion is running, the port may not need to be flushed. ° °How long will my port stay implanted? °The port can stay in for as long as your health care provider thinks it is needed. When it is time for the port to come out, surgery will be   done to remove it. The procedure is similar to the one performed when the port was put in. When should I seek immediate medical care? When you have an implanted port, you should seek immediate medical care if:  You notice a bad smell coming from the incision site.  You have swelling, redness, or drainage at the incision site.  You have more swelling or pain at the port site or the surrounding area.  You have a fever that is not controlled with medicine.  This information is not intended to replace advice given to you by your health care provider. Make sure you discuss any questions you have with your health care provider. Document  Released: 12/19/2004 Document Revised: 05/27/2015 Document Reviewed: 08/26/2012 Elsevier Interactive Patient Education  2017 [place] Insertion, Care After This sheet gives you information about how to care for yourself after your procedure. Your health care provider may also give you more specific instructions. If you have problems or questions, contact your health care provider. What can I expect after the procedure? After your procedure, it is common to have:  Discomfort at the port insertion site.  Bruising on the skin over the port. This should improve over 3-4 days.  Follow these instructions at home: [facility] care  After your port is placed, you will get a manufacturer's information card. The card has information about your port. Keep this card with you at all times.  Take care of the port as told by your health care provider. Ask your health care provider if you or a family member can get training for taking care of the port at home. A home health care nurse may also take care of the port.  Make sure to remember what type of port you have. Incision care  Follow instructions from your health care provider about how to take care of your port insertion site. Make sure you: ? Wash your hands with soap and water before you change your bandage (dressing). If soap and water are not available, use hand sanitizer. ? Change your dressing as told by your health care provider.  You may remove dressing tomorrow 11/04/16. ? Leave skin glue in place. These skin closures may need to stay in place for 2 weeks or longer. Do not remove adhesive strips completely unless your health care provider tells you to do that.  DO NOT use EMLA cream for 2 weeks after port placement as this cream will remove surgical glue.  Check your port insertion site every day for signs of infection. Check for: ? More redness, swelling, or pain. ? More fluid or blood. ? Warmth. ? Pus or a bad  smell. General instructions  Do not take baths, swim, or use a hot tub until your health care provider approves.  You may shower tomorrow 11/04/16.  Do not lift anything that is heavier than 10 lb (4.5 kg) for a week, or as told by your health care provider.  Ask your health care provider when it is okay to: ? Return to work or school. ? Resume usual physical activities or sports.  Do not drive for 24 hours if you were given a medicine to help you relax (sedative).  Take over-the-counter and prescription medicines only as told by your health care provider.  Wear a medical alert bracelet in case of an emergency. This will tell any health care providers that you have a port.  Keep all follow-up visits as told by your health care provider.  This is important. Contact a health care provider if:  You cannot flush your port with saline as directed, or you cannot draw blood from the port.  You have a fever or chills.  You have more redness, swelling, or pain around your port insertion site.  You have more fluid or blood coming from your port insertion site.  Your port insertion site feels warm to the touch.  You have pus or a bad smell coming from the port insertion site. Get help right away if:  You have chest pain or shortness of breath.  You have bleeding from your port that you cannot control. Summary  Take care of the port as told by your health care provider.  Change your dressing as told by your health care provider.  Keep all follow-up visits as told by your health care provider. This information is not intended to replace advice given to you by your health care provider. Make sure you discuss any questions you have with your health care provider. Document Released: 10/09/2012 Document Revised: 11/10/2015 Document Reviewed: 11/10/2015 Elsevier Interactive Patient Education  2017 [place].   Moderate Conscious Sedation, Adult, Care After These instructions provide  you with information about caring for yourself after your procedure. Your health care provider may also give you more specific instructions. Your treatment has been planned according to current medical practices, but problems sometimes occur. Call your health care provider if you have any problems or questions after your procedure. What can I expect after the procedure? After your procedure, it is common:  To feel sleepy for several hours.  To feel clumsy and have poor balance for several hours.  To have poor judgment for several hours.  To vomit if you eat too soon.  Follow these instructions at home: For at least 24 hours after the procedure:   Do not: ? Participate in activities where you could fall or become injured. ? Drive. ? Use heavy machinery. ? Drink alcohol. ? Take sleeping pills or medicines that cause drowsiness. ? Make important decisions or sign legal documents. ? Take care of children on your own.  Rest. Eating and drinking  Follow the diet recommended by your health care provider.  If you vomit: ? Drink water, juice, or soup when you can drink without vomiting. ? Make sure you have little or no nausea before eating solid foods. General instructions  Have a responsible adult stay with you until you are awake and alert.  Take over-the-counter and prescription medicines only as told by your health care provider.  If you smoke, do not smoke without supervision.  Keep all follow-up visits as told by your health care provider. This is important. Contact a health care provider if:  You keep feeling nauseous or you keep vomiting.  You feel light-headed.  You develop a rash.  You have a fever. Get help right away if:  You have trouble breathing. This information is not intended to replace advice given to you by your health care provider. Make sure you discuss any questions you have with your health care provider. Document Released: 10/09/2012 Document  Revised: 05/24/2015 Document Reviewed: 04/10/2015 Elsevier Interactive Patient Education  [employer].

## 2016-11-04 LAB — CULTURE, BODY FLUID W GRAM STAIN -BOTTLE

## 2016-11-04 LAB — CULTURE, BODY FLUID-BOTTLE: CULTURE: NO GROWTH

## 2016-11-07 ENCOUNTER — Other Ambulatory Visit: Payer: Self-pay

## 2016-11-07 MED ORDER — TRAZODONE HCL 50 MG PO TABS: 50.0000 mg | ORAL_TABLET | Freq: Every evening | ORAL | 0 refills | Status: DC | PRN

## 2016-11-09 LAB — FUNGAL ORGANISM REFLEX

## 2016-11-09 LAB — FUNGUS CULTURE WITH STAIN

## 2016-11-09 LAB — FUNGUS CULTURE RESULT

## 2016-11-16 ENCOUNTER — Ambulatory Visit (HOSPITAL_BASED_OUTPATIENT_CLINIC_OR_DEPARTMENT_OTHER): Payer: Medicare Other | Admitting: Hematology

## 2016-11-16 ENCOUNTER — Ambulatory Visit (HOSPITAL_BASED_OUTPATIENT_CLINIC_OR_DEPARTMENT_OTHER): Payer: Medicare Other

## 2016-11-16 ENCOUNTER — Encounter: Payer: Self-pay | Admitting: Hematology

## 2016-11-16 ENCOUNTER — Telehealth: Payer: Self-pay

## 2016-11-16 ENCOUNTER — Other Ambulatory Visit (HOSPITAL_BASED_OUTPATIENT_CLINIC_OR_DEPARTMENT_OTHER): Payer: Medicare Other

## 2016-11-16 VITALS — BP 127/78 | HR 73 | Temp 98.6°F | Resp 17 | Ht 62.0 in | Wt 273.8 lb

## 2016-11-16 VITALS — BP 140/65 | HR 71 | Temp 98.2°F | Resp 17

## 2016-11-16 DIAGNOSIS — Z5112 Encounter for antineoplastic immunotherapy: Secondary | ICD-10-CM | POA: Diagnosis present

## 2016-11-16 DIAGNOSIS — C8338 Diffuse large B-cell lymphoma, lymph nodes of multiple sites: Secondary | ICD-10-CM | POA: Diagnosis not present

## 2016-11-16 DIAGNOSIS — J90 Pleural effusion, not elsewhere classified: Secondary | ICD-10-CM | POA: Diagnosis not present

## 2016-11-16 DIAGNOSIS — Z5111 Encounter for antineoplastic chemotherapy: Secondary | ICD-10-CM | POA: Diagnosis not present

## 2016-11-16 DIAGNOSIS — Z452 Encounter for adjustment and management of vascular access device: Secondary | ICD-10-CM | POA: Diagnosis not present

## 2016-11-16 DIAGNOSIS — I480 Paroxysmal atrial fibrillation: Secondary | ICD-10-CM | POA: Diagnosis not present

## 2016-11-16 LAB — CBC & DIFF AND RETIC
BASO%: 0.3 % (ref 0.0–2.0)
Basophils Absolute: 0 10*3/uL (ref 0.0–0.1)
EOS%: 0.1 % (ref 0.0–7.0)
Eosinophils Absolute: 0 10*3/uL (ref 0.0–0.5)
HCT: 31.8 % — ABNORMAL LOW (ref 34.8–46.6)
HGB: 9.9 g/dL — ABNORMAL LOW (ref 11.6–15.9)
IMMATURE RETIC FRACT: 15.4 % — AB (ref 1.60–10.00)
LYMPH%: 15.2 % (ref 14.0–49.7)
MCH: 27.8 pg (ref 25.1–34.0)
MCHC: 31.1 g/dL — AB (ref 31.5–36.0)
MCV: 89.3 fL (ref 79.5–101.0)
MONO#: 0.4 10*3/uL (ref 0.1–0.9)
MONO%: 5 % (ref 0.0–14.0)
NEUT%: 79.4 % — ABNORMAL HIGH (ref 38.4–76.8)
NEUTROS ABS: 6.1 10*3/uL (ref 1.5–6.5)
PLATELETS: 267 10*3/uL (ref 145–400)
RBC: 3.56 10*6/uL — AB (ref 3.70–5.45)
RDW: 16.5 % — ABNORMAL HIGH (ref 11.2–14.5)
Retic %: 2.22 % — ABNORMAL HIGH (ref 0.70–2.10)
Retic Ct Abs: 79.03 10*3/uL (ref 33.70–90.70)
WBC: 7.7 10*3/uL (ref 3.9–10.3)
lymph#: 1.2 10*3/uL (ref 0.9–3.3)

## 2016-11-16 LAB — COMPREHENSIVE METABOLIC PANEL
ALT: 11 U/L (ref 0–55)
ANION GAP: 8 meq/L (ref 3–11)
AST: 16 U/L (ref 5–34)
Albumin: 3 g/dL — ABNORMAL LOW (ref 3.5–5.0)
Alkaline Phosphatase: 49 U/L (ref 40–150)
BUN: 10.7 mg/dL (ref 7.0–26.0)
CHLORIDE: 107 meq/L (ref 98–109)
CO2: 26 meq/L (ref 22–29)
Calcium: 9.1 mg/dL (ref 8.4–10.4)
Creatinine: 0.8 mg/dL (ref 0.6–1.1)
GLUCOSE: 102 mg/dL (ref 70–140)
POTASSIUM: 3.4 meq/L — AB (ref 3.5–5.1)
SODIUM: 142 meq/L (ref 136–145)
TOTAL PROTEIN: 6.4 g/dL (ref 6.4–8.3)
Total Bilirubin: 0.29 mg/dL (ref 0.20–1.20)

## 2016-11-16 LAB — LACTATE DEHYDROGENASE: LDH: 265 U/L — ABNORMAL HIGH (ref 125–245)

## 2016-11-16 MED ORDER — SODIUM CHLORIDE 0.9% FLUSH
10.0000 mL | INTRAVENOUS | Status: DC | PRN
  Administered 2016-11-16: 10 mL
  Filled 2016-11-16: qty 10

## 2016-11-16 MED ORDER — DOXORUBICIN HCL CHEMO IV INJECTION 2 MG/ML
50.0000 mg/m2 | Freq: Once | INTRAVENOUS | Status: AC
  Administered 2016-11-16: 118 mg via INTRAVENOUS
  Filled 2016-11-16: qty 59

## 2016-11-16 MED ORDER — SODIUM CHLORIDE 0.9 % IV SOLN
Freq: Once | INTRAVENOUS | Status: AC
  Administered 2016-11-16: 13:00:00 via INTRAVENOUS
  Filled 2016-11-16: qty 5

## 2016-11-16 MED ORDER — ACETAMINOPHEN 325 MG PO TABS
ORAL_TABLET | ORAL | Status: AC
Start: 2016-11-16 — End: 2016-11-16
  Filled 2016-11-16: qty 2

## 2016-11-16 MED ORDER — PALONOSETRON HCL INJECTION 0.25 MG/5ML
0.2500 mg | Freq: Once | INTRAVENOUS | Status: AC
  Administered 2016-11-16: 0.25 mg via INTRAVENOUS

## 2016-11-16 MED ORDER — PEGFILGRASTIM 6 MG/0.6ML ~~LOC~~ PSKT
6.0000 mg | PREFILLED_SYRINGE | Freq: Once | SUBCUTANEOUS | Status: DC
  Filled 2016-11-16: qty 0.6

## 2016-11-16 MED ORDER — DIPHENHYDRAMINE HCL 25 MG PO CAPS
50.0000 mg | ORAL_CAPSULE | Freq: Once | ORAL | Status: AC
  Administered 2016-11-16: 50 mg via ORAL

## 2016-11-16 MED ORDER — DIPHENHYDRAMINE HCL 25 MG PO CAPS
ORAL_CAPSULE | ORAL | Status: AC
  Filled 2016-11-16: qty 2

## 2016-11-16 MED ORDER — SODIUM CHLORIDE 0.9 % IV SOLN
375.0000 mg/m2 | Freq: Once | INTRAVENOUS | Status: AC
  Administered 2016-11-16: 900 mg via INTRAVENOUS
  Filled 2016-11-16: qty 50

## 2016-11-16 MED ORDER — ALTEPLASE 2 MG IJ SOLR
2.0000 mg | Freq: Once | INTRAMUSCULAR | Status: AC | PRN
  Administered 2016-11-16: 2 mg
  Filled 2016-11-16: qty 2

## 2016-11-16 MED ORDER — SODIUM CHLORIDE 0.9 % IV SOLN
750.0000 mg/m2 | Freq: Once | INTRAVENOUS | Status: AC
  Administered 2016-11-16: 1780 mg via INTRAVENOUS
  Filled 2016-11-16: qty 89

## 2016-11-16 MED ORDER — ACETAMINOPHEN 325 MG PO TABS
650.0000 mg | ORAL_TABLET | Freq: Once | ORAL | Status: AC
  Administered 2016-11-16: 650 mg via ORAL

## 2016-11-16 MED ORDER — SODIUM CHLORIDE 0.9 % IV SOLN
Freq: Once | INTRAVENOUS | Status: AC
  Administered 2016-11-16: 13:00:00 via INTRAVENOUS

## 2016-11-16 MED ORDER — PALONOSETRON HCL INJECTION 0.25 MG/5ML
INTRAVENOUS | Status: AC
  Filled 2016-11-16: qty 5

## 2016-11-16 MED ORDER — HEPARIN SOD (PORK) LOCK FLUSH 100 UNIT/ML IV SOLN
500.0000 [IU] | Freq: Once | INTRAVENOUS | Status: AC | PRN
  Administered 2016-11-16: 500 [IU]
  Filled 2016-11-16: qty 5

## 2016-11-16 MED ORDER — DEXAMETHASONE 4 MG PO TABS: 8.0000 mg | ORAL_TABLET | Freq: Once | ORAL | 0 refills | Status: DC | PRN

## 2016-11-16 MED ORDER — VINCRISTINE SULFATE CHEMO INJECTION 1 MG/ML
2.0000 mg | Freq: Once | INTRAVENOUS | Status: AC
  Administered 2016-11-16: 2 mg via INTRAVENOUS
  Filled 2016-11-16: qty 2

## 2016-11-16 NOTE — Telephone Encounter (Signed)
Canceled inj. Per MD. Requested notes. Per 11/15 los

## 2016-11-16 NOTE — Patient Instructions (Signed)
[facility] Discharge Instructions for Patients Receiving Chemotherapy  Today you received the following chemotherapy agents Adriamycin, Vincristine, Cytoxan, Rituximab  To help prevent nausea and vomiting after your treatment, we encourage you to take your nausea medication as directed   If you develop nausea and vomiting that is not controlled by your nausea medication, call the clinic.   BELOW ARE SYMPTOMS THAT SHOULD BE REPORTED IMMEDIATELY:  *FEVER GREATER THAN 100.5 F  *CHILLS WITH OR WITHOUT FEVER  NAUSEA AND VOMITING THAT IS NOT CONTROLLED WITH YOUR NAUSEA MEDICATION  *UNUSUAL SHORTNESS OF BREATH  *UNUSUAL BRUISING OR BLEEDING  TENDERNESS IN MOUTH AND THROAT WITH OR WITHOUT PRESENCE OF ULCERS  *URINARY PROBLEMS  *BOWEL PROBLEMS  UNUSUAL RASH Items with * indicate a potential emergency and should be followed up as soon as possible.  Feel free to call the clinic should you have any questions or concerns. The clinic phone number is [phone].  Please show the [facility] [facility] at check-in to the Emergency Department and triage nurse.    Alteplase, TPA injection What is this medicine? ALTEPLASE (AL te plase) can dissolve blood clots that form in the heart, blood vessels, or lungs after a heart attack. This medicine is also given to improve recovery and decrease the chance of disability in patients having symptoms of a stroke. This medicine may be used for other purposes; ask your health care provider or pharmacist if you have questions. COMMON BRAND NAME(S): Activase, Cathflo Activase What should I tell my health care provider before I take this medicine? They need to know if you have any of these conditions: -aneurysm -bleeding problems or problems with blood clotting -blood vessel disease or damaged blood vessels -diabetic retinopathy -head injury or tumor -high blood pressure -infection -irregular heartbeats -previous stroke -recent biopsy  or surgery -an unusual or allergic reaction to alteplase, other medicines, foods, dyes, or preservatives -pregnant or trying to get pregnant -breast-feeding How should I use this medicine? This medicine is for injection into a vein. It is given by a health care professional in a hospital or clinic setting. Talk to your pediatrician regarding the use of this medicine in children. Special care may be needed. Overdosage: If you think you have taken too much of this medicine contact a poison control center or emergency room at once. NOTE: This medicine is only for you. Do not share this medicine with others. What if I miss a dose? This does not apply. What may interact with this medicine? Do not take this medicine with any of the following medications: -aminocaproic acid -aprotinin -tranexamic acid This medicine may also interact with the following medications: -antiinflammatory drugs, NSAIDs like ibuprofen -aspirin and aspirin-like medicines -blood thinners, like warfarin, heparin or enoxaparin -dipyridamole This list may not describe all possible interactions. Give your health care provider a list of all the medicines, herbs, non-prescription drugs, or dietary supplements you use. Also tell them if you smoke, drink alcohol, or use illegal drugs. Some items may interact with your medicine. What should I watch for while using this medicine? Your condition will be monitored carefully while you are receiving this medicine. Follow the advice of your doctor or health care professional exactly. You may need bed rest to minimize the risk of bleeding. This medicine can make you bleed more easily. This effect can last for several days. Take special care brushing or flossing your teeth. Do not take aspirin, ibuprofen, or other nonprescription pain relievers during or for several days  after alteplase treatment unless otherwise instructed by your doctor or health care professional. You may feel dizzy or  lightheaded. To avoid the risk of dizzy or fainting spells, sit or stand up slowly, especially if you are an older patient. What side effects may I notice from receiving this medicine? Side effects that you should report to your doctor or health care professional as soon as possible: -allergic reactions like skin rash, itching or hives, swelling of the face, lips, or tongue -signs and symptoms of bleeding such as bloody or black, tarry stools; red or dark-brown urine; spitting up blood or brown material that looks like coffee grounds; red spots on the skin; unusual bruising or bleeding from the eye, gums, or nose -signs and symptoms of a stroke such as changes in vision; confusion; trouble speaking or understanding; severe headaches; sudden numbness or weakness of the face, arm, or leg; trouble walking; dizziness; loss of balance or coordination -slow or fast heart rate Side effects that usually do not require medical attention (report to your doctor or health care professional if they continue or are bothersome): -dizziness -fever -nausea, vomiting This list may not describe all possible side effects. Call your doctor for medical advice about side effects. You may report side effects to FDA at 1-800-FDA-1088. Where should I keep my medicine? This does not apply. You will not be given this medicine to store at home. NOTE: This sheet is a summary. It may not cover all possible information. If you have questions about this medicine, talk to your doctor, pharmacist, or health care provider.  2018 Elsevier/[employer] Standard (2012-04-16 19:16:18)

## 2016-11-16 NOTE — Progress Notes (Signed)
Patient declines On-Pro device and would rather come back for neulasta injection. She will be scheduled accordingly.

## 2016-11-16 NOTE — Progress Notes (Signed)
Jo Collins Kitchen  HEMATOLOGY ONCOLOGY CLINIC NOTE  Date of service: 11/16/16  Patient Care Team: Deland Pretty, MD as PCP - General (Internal Medicine)  CC: post hospitalization f/u for management of newly diagnosed diffuse large B cell lymphoma  Diagnosis: Diffuse large B cell lymphoma  Current Treatment: R-CHOP with G-CSF support s/p R-CHOP x 2 cycles.  HISTORY OF PRESENT ILLNESS: please see original clinic note for HPI  INTERVAL HISTORY:  Jo Collins reports today for follow-up into the clinic for her lymphoma. Currently, she reports that she is doing well and that she is regaining her strength, mentally and physically. She has two cycles remaining. Her nausea was well controlled from last cycle, however, she had new acute lower back pain following her most recent cycle. She describes her pain as sharp and her episode of pain lasted for approximately two hours. Emotionally, she states that her mood has been doing well at work and somewhat worsened while she is at home and remaining inactive. She reports that towards the end of her breaks and before beginning her next cycles that her mood will worsen again because she dislikes chemotherapy. We had a very lengthy discussion today about her Intrathecal Methotrexate as well. She overall is hesitant, but she would like to proceed with this to have the best chance at reducing recurrence.   On review of systems, pt denies fever, chills, rash, mouth sores, weight loss, decreased appetite, urinary complaints. Pt denies abdominal pain, nausea, vomiting. Notable for that in the HPI above.   REVIEW OF SYSTEMS:    A 10+ POINT REVIEW OF SYSTEMS WAS OBTAINED including neurology, dermatology, psychiatry, cardiac, respiratory, lymph, extremities, GI, GU, Musculoskeletal, constitutional, breasts, reproductive, HEENT.  All pertinent positives are noted in the HPI.  All others are negative.  . Past Medical History:  Diagnosis Date  . GERD (gastroesophageal reflux  disease)   . NHL (non-Hodgkin's lymphoma) [facility])     Past Surgical History:  Procedure Laterality Date  . ABDOMINAL HYSTERECTOMY    . IR CV LINE INJECTION  10/26/2016  . IR FLUORO GUIDE PORT INSERTION RIGHT  08/18/2016  . IR FLUORO GUIDE PORT INSERTION RIGHT  11/03/2016  . IR REMOVAL TUN CV CATH W/O FL  11/03/2016  . IR THORACENTESIS ASP PLEURAL SPACE W/IMG GUIDE  08/07/2016  . IR THORACENTESIS ASP PLEURAL SPACE W/IMG GUIDE  08/14/2016  . IR US GUIDE VASC ACCESS RIGHT  08/18/2016  . IR US GUIDE VASC ACCESS RIGHT  11/03/2016    . Social History   Tobacco Use  . Smoking status: Former Smoker    Packs/day: 0.50    Years: 47.00    Pack years: 23.50    Types: Cigarettes    Last attempt to quit: 07/28/2016    Years since quitting: 0.3  . Smokeless tobacco: Never Used  Substance Use Topics  . Alcohol use: No  . Drug use: No    ALLERGIES:  has No Known Allergies.  MEDICATIONS:  Current Outpatient Medications  Medication Sig Dispense Refill  . acetaminophen (TYLENOL) 500 MG tablet Take 1,000 mg by mouth every 6 (six) hours as needed for moderate pain.    . benzonatate (TESSALON) 100 MG capsule Take 1 capsule (100 mg total) by mouth 3 (three) times daily as needed for cough. 20 capsule 0  . dexamethasone (DECADRON) 4 MG tablet Take 2 tablets (8 mg total) once as needed for up to 1 dose by mouth. Take 45-60 mins prior to Intrathecal methotrexate 10 tablet 0  .  diphenhydrAMINE (BENADRYL) 25 MG tablet Take 25-50 mg by mouth daily as needed for allergies (chemotherapy infusion).     . fluticasone (FLONASE) 50 MCG/ACT nasal spray Place 2 sprays into both nostrils daily. 16 g 0  . furosemide (LASIX) 20 MG tablet Take 1 tablet (20 mg total) by mouth daily. 30 tablet 1  . HYDROcodone-homatropine (HYCODAN) 5-1.5 MG/5ML syrup Take 5 mLs by mouth 3 (three) times daily. 120 mL 0  . lidocaine-prilocaine (EMLA) cream Apply 1 application topically as needed. Apply to port-a-cath assess site 45-60 mins  prior to chemotherapy. 30 g 6  . loratadine (CLARITIN) 10 MG tablet Take 10 mg by mouth daily as needed for allergies.    . Multiple Vitamin (MULTIVITAMIN WITH MINERALS) TABS tablet Take 1 tablet by mouth daily.    . ondansetron (ZOFRAN) 4 MG tablet Take 4 mg by mouth every 8 (eight) hours as needed for nausea or vomiting.    . polyethylene glycol (MIRALAX / GLYCOLAX) packet Take 17 g by mouth daily. 14 each 0  . predniSONE (DELTASONE) 20 MG tablet Take 3 tablets (60 mg total) by mouth daily. Take on days 1-5 of chemotherapy. 20 tablet 5  . prochlorperazine (COMPAZINE) 10 MG tablet Take 1 tablet (10 mg total) by mouth every 6 (six) hours as needed (Nausea or vomiting). 30 tablet 6  . traZODone (DESYREL) 50 MG tablet Take 1 tablet (50 mg total) at bedtime as needed by mouth for sleep. 30 tablet 0  . vitamin B-12 500 MCG tablet Take 1 tablet (500 mcg total) by mouth daily. (Patient taking differently: Take 500 mcg by mouth every other day. ) 30 tablet 0   No current facility-administered medications for this visit.     PHYSICAL EXAMINATION: ECOG PERFORMANCE STATUS: 1 - Symptomatic but completely ambulatory  . Vitals:   11/16/16 0945  BP: 127/78  Pulse: 73  Resp: 17  Temp: 98.6 F (37 C)  SpO2: 100%    Filed Weights   11/16/16 0945  Weight: 273 lb 12.8 oz (124.2 kg)   .Body mass index is 50.08 kg/m.  GENERAL:alert, in no acute distress and comfortable SKIN: no acute rashes, no significant lesions EYES: conjunctiva are pink and non-injected, sclera anicteric OROPHARYNX: MMM, no exudates, no oropharyngeal erythema or ulceration NECK: supple, no JVD LYMPH:  no palpable lymphadenopathy in the cervical, axillary or inguinal regions LUNGS: distant breath sounds. Decreased AE rt base about 1/4 up posterior chest wall. HEART: S1/S2 irregular rate & rhythm ABDOMEN:  normoactive bowel sounds , non tender, not distended. Extremity: grade 1 pedal edema PSYCH: alert & oriented x 3 with  fluent speech NEURO: no focal motor/sensory deficits   LABORATORY DATA:   I have reviewed the data as listed  . CBC Latest Ref Rng & Units 11/16/2016 11/03/2016 10/25/2016  WBC 3.9 - 10.3 10e3/uL 7.7 1.4(LL) 7.8  Hemoglobin 11.6 - 15.9 g/dL 9.9(L) 9.2(L) 10.0(L)  Hematocrit 34.8 - 46.6 % 31.8(L) 28.7(L) 32.2(L)  Platelets 145 - 400 10e3/uL 267 120(L) 306    . CMP Latest Ref Rng & Units 11/16/2016 11/03/2016 10/25/2016  Glucose 70 - 140 mg/dl 102 101(H) 116  BUN 7.0 - 26.0 mg/dL 10.7 7 8.8  Creatinine 0.6 - 1.1 mg/dL 0.8 0.77 0.8  Sodium 136 - 145 mEq/L 142 140 143  Potassium 3.5 - 5.1 mEq/L 3.4(L) 3.1(L) 3.7  Chloride 101 - 111 mmol/L - 104 -  CO2 22 - 29 mEq/L 26 30 27   Calcium 8.4 - 10.4 mg/dL 9.1  8.6(L) 9.1  Total Protein 6.4 - 8.3 g/dL 6.4 - 6.4  Total Bilirubin 0.20 - 1.20 mg/dL 0.29 - 0.40  Alkaline Phos 40 - 150 U/L 49 - 45  AST 5 - 34 U/L 16 - 15  ALT 0 - 55 U/L 11 - 11   . Lab Results  Component Value Date   LDH 265 (H) 11/16/2016     RADIOGRAPHIC STUDIES: I have personally reviewed the radiological images as listed and agreed with the findings in the report. Dg Chest 1 View  Result Date: 10/30/2016 CLINICAL DATA:  Right anterior shoulder pain. Post right thoracentesis EXAM: CHEST 1 VIEW COMPARISON:  10/09/2016 FINDINGS: Right Port-A-Cath remains in place, unchanged. No pneumothorax following right thoracentesis. Small right pleural effusion with right lower lobe airspace disease. Mild cardiomegaly. Left lung clear. IMPRESSION: No pneumothorax following right thoracentesis. Small right effusion and right lower lobe airspace disease again noted, unchanged. Electronically Signed   By: Rolm Baptise M.D.   On: 10/30/2016 10:57   Nm Pet Image Restag (ps) Skull Base To Thigh  Result Date: 10/22/2016 CLINICAL DATA:  Diffuse large B-cell lymphoma restaging after 2 out of 4 cycles of chemotherapy. EXAM: NUCLEAR MEDICINE PET SKULL BASE TO THIGH TECHNIQUE: 12.7 mCi F-18  FDG was injected intravenously. Full-ring PET imaging was performed from the skull base to thigh after the radiotracer. CT data was obtained and used for attenuation correction and anatomic localization. FASTING BLOOD GLUCOSE:  Value: 106 mg/dl COMPARISON:  PET-CT August 19, 2016 FINDINGS: NECK: Uptake in the right side of the mandible is likely odontogenic. No FDG avid disease identified in the head or neck. There is mild muscular uptake in the left side of the neck. CHEST: The hypermetabolic paraspinal mass on the previous study has significantly improved with only mild scattered soft tissue in this region today. The patchy nature of soft tissue remaining makes it difficult to measure accurately. However, the mass measured 8.6 x 9.4 cm previously and measures up to 4.7 by 2.1 cm on series 4, image 87 of today's study. There is low level uptake remaining in this region today with a maximum SUV of 5.78 today versus 17.7 previously. The FDG avid pleural nodularity seen on the previous study has resolved. The 3 x 1.3 cm pleural nodule medially on the right on series 4, image 62 of the previous study is no longer seen today. The juxta diaphragmatic lymph node described on the previous study has resolved as well. No other soft tissue disease seen in the chest on today's study. Mediastinal blood pool demonstrates an SUV of 5.5. ABDOMEN/PELVIS: No abnormal uptake in the parenchymal organs of the abdomen or pelvis. Specifically, no hepatic or splenic uptake noted. Physiologic uptake is seen in the renal collecting system and bowel. The gastrohepatic ligament node described on the previous study is smaller in the interval measuring 8 mm today versus 12 mm previously with a maximum SUV of 3.4 today versus 4.9 previously. The right external iliac lymph node measuring 2.4 cm on the previous study measures 1.9 cm on series 4, image 161 today. The maximum SUV is 8.2 today versus 9.3 previously. The previously described right  inguinal node has resolved in the interval and is no longer clearly visualized with no abnormal FDG uptake in this region today. No other FDG avid disease is seen in the abdomen or pelvis. Liver blood pool demonstrates a maximum SUV of 6.0. SKELETON: There is diffuse uptake throughout the bones which is mildly heterogeneous. I  suspect the majority of this uptake is due to recent chemotherapy and supportive measures. The abnormal uptake in the left humeral shaft on the previous study does percent with a maximum SUV of 8.5 today versus 10.8 previously. This uptake is less conspicuous today however due to the diffuse marrow uptake. The uptake in T7 and T8 is now similar to to the diffuse marrow uptake seen at multiple levels. INCIDENTAL FINDINGS: A right pleural effusion remains. The right Port-A-Cath is in good position. IMPRESSION: 1. There has been significant interval improvement in the chest, abdomen, and pelvis in the interval. The dominant mass in the chest is much smaller and less FDG avid in the interval. However, the remaining maximum uptake is slightly greater than mediastinal blood pool. The dominant remaining disease in the pelvis is an external iliac lymph node with a maximum SUV moderately greater than liver blood pool. Evaluation of the bones is more difficult today given the diffuse uptake likely associated with recent chemotherapy. I suspect the previously identified humeral lesion remains. Recommend continued attention to the bones on follow-up. [place] Category 4 Electronically Signed   By: Dorise Bullion III M.D   On: 10/22/2016 16:32   Ir Removal Tun Cv Cath W/o Fl  Addendum Date: 11/03/2016   ADDENDUM REPORT: 11/03/2016 16:11 ADDENDUM: Sedation medications: 4 mg Versed, 200 mcg fentanyl. Sedation time = 51 minutes Electronically Signed   By: Markus Daft M.D.   On: 11/03/2016 16:11   Result Date: 11/03/2016 INDICATION: 66 year old with non-Hodgkin's lymphoma. Patient has a right jugular  Port-A-Cath which is not functioning well due to a fibrin sheath. Patient was scheduled for a 40 vision or catheter stripping. After reviewing the previous catheter injection and evaluating the current Port-A-Cath, felt that a complete port revision with removal of the old port and placement of a new port would be the best option for this patient. EXAM: IMPLANTED PORT A CATH PLACEMENT WITH ULTRASOUND AND FLUOROSCOPIC GUIDANCE REMOVAL OF SUBCUTANEOUS PORT MEDICATIONS: Ancef 2 g; The antibiotic was administered within an appropriate time interval prior to skin puncture. ANESTHESIA/SEDATION: Moderate (conscious) sedation was employed during this procedure. A total of Versed mg and Fentanyl mcg was administered intravenously. Moderate Sedation Time: minutes. The patient's level of consciousness and vital signs were monitored continuously by radiology nursing throughout the procedure under my direct supervision. FLUOROSCOPY TIME:  48 seconds, 35 mGy COMPLICATIONS: None immediate. PROCEDURE: The procedure, risks, benefits, and alternatives were explained to the patient. Questions regarding the procedure were encouraged and answered. The patient understands and consents to the procedure. Patient was placed supine on the interventional table. Ultrasound confirmed a patent right internal jugular vein. The right chest and neck were cleaned with a skin antiseptic and a sterile drape was placed. Maximal barrier sterile technique was utilized including caps, mask, sterile gowns, sterile gloves, sterile drape, hand hygiene and skin antiseptic. The old port scar was anesthetized with 1% lidocaine with epinephrine. An incision was made over the old scar. The port was easily removed with blunt dissection. The port pocket was irrigated with sterile saline. Pocket was closed using 2 layers of absorbable suture and Dermabond. The right neck was anesthetized with 1% lidocaine. Small incision was made in the right neck with a blade.  Micropuncture set was placed in the right internal jugular vein with ultrasound guidance. The micropuncture wire was used for measurement purposes. The right chest was anesthetized with 1% lidocaine with epinephrine. #15 blade was used to make an incision and a subcutaneous  port pocket was formed. [place] was assembled. Subcutaneous tunnel was formed with a stiff tunneling device. The port catheter was brought through the subcutaneous tunnel. The port was placed in the subcutaneous pocket and sutured in place. The micropuncture set was exchanged for a peel-away sheath. The catheter was placed through the peel-away sheath and the tip was positioned at the superior cavoatrial junction. Catheter placement was confirmed with fluoroscopy. The port was accessed and flushed with heparinized saline. The port pocket was closed using two layers of absorbable sutures and Dermabond. The vein skin site was closed using a single layer of absorbable suture and Dermabond. Sterile dressings were applied. Patient tolerated the procedure well without an immediate complication. Ultrasound and fluoroscopic images were taken and saved for this procedure. IMPRESSION: Right chest Port-A-Cath was completely removed and replaced with new right jugular Port-A-Cath. New catheter tip is at the superior cavoatrial junction. Electronically Signed: By: Markus Daft M.D. On: 11/03/2016 15:59   Ir Cv Line Injection  Result Date: 10/26/2016 INDICATION: 66 year old with non-Hodgkin's lymphoma. Port-A-Cath is not aspirating. EXAM: PORT-A-CATH INJECTION WITH FLUOROSCOPY Physician: Stephan Minister. Anselm Pancoast, MD FLUOROSCOPY TIME:  79.48 mGy MEDICATIONS: None ANESTHESIA/SEDATION: None PROCEDURE: Patient was supine on the fluoroscopic table. The Port-A-Cath was injected with 15 mL of contrast under fluoroscopic evaluation. Catheter was flushed after the contrast injection. FINDINGS: Right jugular Port-A-Cath has slightly pulled back and appears to be in  the upper SVC. Part of the catheter is coiled within the right neck soft tissues. The catheter tip appears to be against the vein wall near the azygos vein. Some contrast was actually filling the azygos vein. COMPLICATIONS: None IMPRESSION: Catheter tip is against the upper SVC wall and associated with a large fibrin sheath. PLAN: Recommend attempting a catheter stripping procedure. If that procedure is unsuccessful than the port will either need to be removed or revised. Electronically Signed   By: Markus Daft M.D.   On: 10/26/2016 14:36   Ir US Guide Vasc Access Right  Addendum Date: 11/03/2016   ADDENDUM REPORT: 11/03/2016 16:11 ADDENDUM: Sedation medications: 4 mg Versed, 200 mcg fentanyl. Sedation time = 51 minutes Electronically Signed   By: Markus Daft M.D.   On: 11/03/2016 16:11   Result Date: 11/03/2016 INDICATION: 66 year old with non-Hodgkin's lymphoma. Patient has a right jugular Port-A-Cath which is not functioning well due to a fibrin sheath. Patient was scheduled for a 40 vision or catheter stripping. After reviewing the previous catheter injection and evaluating the current Port-A-Cath, felt that a complete port revision with removal of the old port and placement of a new port would be the best option for this patient. EXAM: IMPLANTED PORT A CATH PLACEMENT WITH ULTRASOUND AND FLUOROSCOPIC GUIDANCE REMOVAL OF SUBCUTANEOUS PORT MEDICATIONS: Ancef 2 g; The antibiotic was administered within an appropriate time interval prior to skin puncture. ANESTHESIA/SEDATION: Moderate (conscious) sedation was employed during this procedure. A total of Versed mg and Fentanyl mcg was administered intravenously. Moderate Sedation Time: minutes. The patient's level of consciousness and vital signs were monitored continuously by radiology nursing throughout the procedure under my direct supervision. FLUOROSCOPY TIME:  48 seconds, 35 mGy COMPLICATIONS: None immediate. PROCEDURE: The procedure, risks, benefits, and  alternatives were explained to the patient. Questions regarding the procedure were encouraged and answered. The patient understands and consents to the procedure. Patient was placed supine on the interventional table. Ultrasound confirmed a patent right internal jugular vein. The right chest and neck were cleaned with a skin antiseptic  and a sterile drape was placed. Maximal barrier sterile technique was utilized including caps, mask, sterile gowns, sterile gloves, sterile drape, hand hygiene and skin antiseptic. The old port scar was anesthetized with 1% lidocaine with epinephrine. An incision was made over the old scar. The port was easily removed with blunt dissection. The port pocket was irrigated with sterile saline. Pocket was closed using 2 layers of absorbable suture and Dermabond. The right neck was anesthetized with 1% lidocaine. Small incision was made in the right neck with a blade. Micropuncture set was placed in the right internal jugular vein with ultrasound guidance. The micropuncture wire was used for measurement purposes. The right chest was anesthetized with 1% lidocaine with epinephrine. #15 blade was used to make an incision and a subcutaneous port pocket was formed. [place] was assembled. Subcutaneous tunnel was formed with a stiff tunneling device. The port catheter was brought through the subcutaneous tunnel. The port was placed in the subcutaneous pocket and sutured in place. The micropuncture set was exchanged for a peel-away sheath. The catheter was placed through the peel-away sheath and the tip was positioned at the superior cavoatrial junction. Catheter placement was confirmed with fluoroscopy. The port was accessed and flushed with heparinized saline. The port pocket was closed using two layers of absorbable sutures and Dermabond. The vein skin site was closed using a single layer of absorbable suture and Dermabond. Sterile dressings were applied. Patient tolerated the  procedure well without an immediate complication. Ultrasound and fluoroscopic images were taken and saved for this procedure. IMPRESSION: Right chest Port-A-Cath was completely removed and replaced with new right jugular Port-A-Cath. New catheter tip is at the superior cavoatrial junction. Electronically Signed: By: Markus Daft M.D. On: 11/03/2016 15:59   Dg Fluoro Guide Spinal/si Jt Inj Left  Result Date: 10/26/2016 CLINICAL DATA:  Diffuse large B-cell lymphoma. For intrathecal methotrexate. EXAM: FLUOROSCOPICALLY GUIDED LUMBAR PUNCTURE FOR INTRATHECAL CHEMOTHERAPY TECHNIQUE: Informed consent was obtained from the patient prior to the procedure, including potential complications of headache, allergy, and pain. A 'time out' was performed. With the patient prone, the lower back was prepped with Betadine. 1% Lidocaine was used for local anesthesia. Lumbar puncture was performed at the L2-3 in using a 22 gauge needle with return of clear CSF. Pharmacy prepared solution of methotrexate and hydrocortisone was injected into the subarachnoid space. The patient has a history of severe nausea following the previous intrathecal injection. 12mg  of methotrexate was injected into the subarachnoid space. The patient tolerated the procedure well without apparent complication. FLUOROSCOPY TIME:  0 minutes 18 seconds IMPRESSION: Intrathecal injection of chemotherapy without complication Electronically Signed   By: Franchot Gallo M.D.   On: 10/26/2016 13:08   Ir Fluoro Guide Port Insertion Right  Addendum Date: 11/03/2016   ADDENDUM REPORT: 11/03/2016 16:11 ADDENDUM: Sedation medications: 4 mg Versed, 200 mcg fentanyl. Sedation time = 51 minutes Electronically Signed   By: Markus Daft M.D.   On: 11/03/2016 16:11   Result Date: 11/03/2016 INDICATION: 66 year old with non-Hodgkin's lymphoma. Patient has a right jugular Port-A-Cath which is not functioning well due to a fibrin sheath. Patient was scheduled for a 40 vision or  catheter stripping. After reviewing the previous catheter injection and evaluating the current Port-A-Cath, felt that a complete port revision with removal of the old port and placement of a new port would be the best option for this patient. EXAM: IMPLANTED PORT A CATH PLACEMENT WITH ULTRASOUND AND FLUOROSCOPIC GUIDANCE REMOVAL OF SUBCUTANEOUS PORT  MEDICATIONS: Ancef 2 g; The antibiotic was administered within an appropriate time interval prior to skin puncture. ANESTHESIA/SEDATION: Moderate (conscious) sedation was employed during this procedure. A total of Versed mg and Fentanyl mcg was administered intravenously. Moderate Sedation Time: minutes. The patient's level of consciousness and vital signs were monitored continuously by radiology nursing throughout the procedure under my direct supervision. FLUOROSCOPY TIME:  48 seconds, 35 mGy COMPLICATIONS: None immediate. PROCEDURE: The procedure, risks, benefits, and alternatives were explained to the patient. Questions regarding the procedure were encouraged and answered. The patient understands and consents to the procedure. Patient was placed supine on the interventional table. Ultrasound confirmed a patent right internal jugular vein. The right chest and neck were cleaned with a skin antiseptic and a sterile drape was placed. Maximal barrier sterile technique was utilized including caps, mask, sterile gowns, sterile gloves, sterile drape, hand hygiene and skin antiseptic. The old port scar was anesthetized with 1% lidocaine with epinephrine. An incision was made over the old scar. The port was easily removed with blunt dissection. The port pocket was irrigated with sterile saline. Pocket was closed using 2 layers of absorbable suture and Dermabond. The right neck was anesthetized with 1% lidocaine. Small incision was made in the right neck with a blade. Micropuncture set was placed in the right internal jugular vein with ultrasound guidance. The micropuncture  wire was used for measurement purposes. The right chest was anesthetized with 1% lidocaine with epinephrine. #15 blade was used to make an incision and a subcutaneous port pocket was formed. [place] was assembled. Subcutaneous tunnel was formed with a stiff tunneling device. The port catheter was brought through the subcutaneous tunnel. The port was placed in the subcutaneous pocket and sutured in place. The micropuncture set was exchanged for a peel-away sheath. The catheter was placed through the peel-away sheath and the tip was positioned at the superior cavoatrial junction. Catheter placement was confirmed with fluoroscopy. The port was accessed and flushed with heparinized saline. The port pocket was closed using two layers of absorbable sutures and Dermabond. The vein skin site was closed using a single layer of absorbable suture and Dermabond. Sterile dressings were applied. Patient tolerated the procedure well without an immediate complication. Ultrasound and fluoroscopic images were taken and saved for this procedure. IMPRESSION: Right chest Port-A-Cath was completely removed and replaced with new right jugular Port-A-Cath. New catheter tip is at the superior cavoatrial junction. Electronically Signed: By: Markus Daft M.D. On: 11/03/2016 15:59   US Thoracentesis Asp Pleural Space W/img Guide  Result Date: 10/30/2016 INDICATION: History of lymphoma. Recurrent right pleural effusion. Request diagnostic and therapeutic thoracentesis. EXAM: ULTRASOUND GUIDED RIGHT THORACENTESIS MEDICATIONS: None. COMPLICATIONS: None immediate. Postprocedural chest x-ray negative for pneumothorax. PROCEDURE: An ultrasound guided thoracentesis was thoroughly discussed with the patient and questions answered. The benefits, risks, alternatives and complications were also discussed. The patient understands and wishes to proceed with the procedure. Written consent was obtained. Ultrasound was performed to localize and  mark an adequate pocket of fluid in the right chest. The area was then prepped and draped in the normal sterile fashion. 1% Lidocaine was used for local anesthesia. Under ultrasound guidance a T centesis catheter was introduced. Thoracentesis was performed. The catheter was removed and a dressing applied. FINDINGS: A total of approximately 950 mL of chylous fluid was removed. Samples were sent to the laboratory as requested by the clinical team. IMPRESSION: Successful ultrasound guided right thoracentesis yielding 950 mL of pleural fluid. Read  by: Ascencion Dike PA-C Electronically Signed   By: Sandi Mariscal M.D.   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  -PET scan skull base to thigh on 10/21/2016 with results revealing: IMPRESSION:1. There has been significant interval improvement in the chest, abdomen, and pelvis in the interval. The dominant mass in the chest is much smaller and less FDG avid in the interval. However, the remaining maximum uptake is slightly greater than mediastinal blood pool. The dominant remaining disease in the pelvis is an external iliac lymph node with a maximum SUV moderately greater than liver blood pool. Evaluation of the bones is more difficult today given the diffuse uptake likely associated with recent chemotherapy. I suspect the previously identified humeral lesion remains. Recommend continued attention to the bones on follow-up. [place] Category 4   #2 Recurrent right pleural effusion likely related to lymphoma possibly from decreased lymphatic drainage. Additional element of pleural effusion from ? Diastolic dysfunction +steroids with fluid retention+ atelectasis from body habitus  -Had  thoracentesis during her most recent admission in October with resolution of this and of her symptoms.   -no symptoms from this currently.  #3 Back pain with large paraspinal mass in the thoracic region with possible epidural extension. No overt neurological symptoms related to this.  No significant back pain related to this curently - this has resolved with response of her lymphoma to treatment.  Plan Labs stable and no prohibitive new toxicities Patient appropriate to proceed with 5th cycle of R-CHOP from today -IT MTX C5 and C6 around D14 -Advised to continue use of OTC Claritin or zyrtec for sinus issues.  -OTC PPI use advised to aid with GERD symptoms.  - I have added Emend with her R-CHOP and dexamethasone and compazine D2 pre-IT MTX to help with nausea. No nausea as of last cycle.  -We will also consider referring her to RadOnc following her chemotherapy to consider ISRT to residual lymphoma/areas of bulky involvement.    #4 Paroxysmal atrial fibrillation - currently in sinus rhythm today. Plan -f/uw with PCP/Cardiology   Patient Active Problem List   Diagnosis Date Noted  . S/P thoracentesis   . Cough   . Neutrophilic leukocytosis 94/07/6806  . Recurrent pleural effusion on right 10/08/2016  . SOB (shortness of breath) 09/26/2016  . Acute respiratory distress 09/25/2016  . Chronic low back pain 09/25/2016  . Hypokalemia 09/25/2016  . Recurrent right pleural effusion 09/25/2016  . DLBCL (diffuse large B cell lymphoma) ([place]) 08/17/2016  . Acute midline thoracic back pain   . Lymphadenopathy   . Morbid obesity with BMI of 50.0-59.9, adult ([place])   . NHL (non-Hodgkin's lymphoma) ([place])   . Abdominal pain, RUQ   . Pleural effusion on right 08/07/2016  . Abnormal EKG 08/07/2016  . 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 Anemia 08/07/2016  . GERD (gastroesophageal reflux disease) 08/07/2016  . Shortness of breath 08/07/2016   -continue f/u with PCP for other medical cares and for  continue mx of Paroxysmal afib. -would recommend sleep study to evaluate for sleep apnea.   -Intrathecal methotrexate with IR on 11/30/2016 -Please schedule last cycle of R-CHOP on 12/07/2016  I spent 20 minutes counseling the patient face to face. The total  time spent in the appointment was 30 minutes and more than 50% was on counseling and direct patient cares.    Sullivan Lone MD [place] AAHIVMS [facility] Hematology/Oncology Physician [facility]  (Office):       [phone] (Work cell):  [phone] (Fax):           [phone]  This document serves as a record of services personally performed by Sullivan Lone, MD. It was created on his behalf by Reola Mosher, a trained medical scribe. The creation of this record is based on the scribe's personal observations and the provider's statements to them.   .I have reviewed the above documentation for accuracy and completeness, and I agree with the above. Brunetta Genera MD MS

## 2016-11-16 NOTE — Patient Instructions (Signed)
Thank you for choosing [facility] to provide your oncology and hematology care.  To afford each patient quality time with our providers, please arrive 30 minutes before your scheduled appointment time.  If you arrive late for your appointment, you may be asked to reschedule.  We strive to give you quality time with our providers, and arriving late affects you and other patients whose appointments are after yours.   If you are a no show for multiple scheduled visits, you may be dismissed from the clinic at the providers discretion.    Again, thank you for choosing [facility], our hope is that these requests will decrease the amount of time that you wait before being seen by our physicians.  ______________________________________________________________________  Should you have questions after your visit to the [facility], please contact our office at [phone] between the hours of 8:30 and 4:30 p.m.    Voicemails left after 4:30p.m will not be returned until the following business day.    For prescription refill requests, please have your pharmacy contact us directly.  Please also try to allow 48 hours for prescription requests.    Please contact the scheduling department for questions regarding scheduling.  For scheduling of procedures such as PET scans, CT scans, MRI, Ultrasound, etc please contact central scheduling at [phone].    Resources For Cancer Patients and Caregivers:   Oncolink.org:  A wonderful resource for patients and healthcare providers for information regarding your disease, ways to tract your treatment, what to expect, etc.     American Cancer Society:  800-227-2345  Can help patients locate various types of support and financial assistance  Cancer Care: 1-800-813-HOPE (4673) Provides financial assistance, online support groups, medication/co-pay assistance.    [place] DSS:  336-641-3447 Where to apply for food  stamps, Medicaid, and utility assistance  Medicare Rights Center: 800-333-4114 Helps people with Medicare understand their rights and benefits, navigate the Medicare system, and secure the quality healthcare they deserve  SCAT: 336-333-6589 [employer]'s shared-ride transportation service for eligible riders who have a disability that prevents them from riding the fixed route bus.    For additional information on assistance programs please contact our social worker:   Grier Hock/Abigail Elmore:  [phone]            

## 2016-11-17 ENCOUNTER — Ambulatory Visit (INDEPENDENT_AMBULATORY_CARE_PROVIDER_SITE_OTHER): Payer: Medicare Other | Admitting: Internal Medicine

## 2016-11-17 ENCOUNTER — Encounter: Payer: Self-pay | Admitting: Internal Medicine

## 2016-11-17 ENCOUNTER — Telehealth: Payer: Self-pay | Admitting: *Deleted

## 2016-11-17 ENCOUNTER — Telehealth: Payer: Self-pay

## 2016-11-17 VITALS — BP 124/74 | HR 52 | Ht 61.5 in | Wt 274.6 lb

## 2016-11-17 DIAGNOSIS — J90 Pleural effusion, not elsewhere classified: Secondary | ICD-10-CM

## 2016-11-17 MED ORDER — DEXAMETHASONE 4 MG PO TABS: 8.0000 mg | ORAL_TABLET | Freq: Once | ORAL | 0 refills | Status: DC | PRN

## 2016-11-17 NOTE — Patient Instructions (Signed)
ICD-10-CM   1. Recurrent right pleural effusion J90     Agree with expectant approach Keep up with PET scan later in 2018  Follow-up -Sometime in January 2019 with Dr. Chase Caller -Return sooner if needed -If effusions keep recurring then Pleurx catheter would be the option

## 2016-11-17 NOTE — Telephone Encounter (Signed)
Received triage note that pt stated she does not take dexamethasone.  Per Dr. Irene Limbo, pt to take dexamethasone 45-60 minutes prior to IT mtx.  Reviewed with patient.  Pt verbalized understanding.  Will pick up rx prior to IT chemo.

## 2016-11-17 NOTE — Telephone Encounter (Signed)
Pt called while she was at the pharmacy.  Trazodone was called in yesterday as a refill. She did not need a refill yet, pharmacy can hold this.  Dexamethasone was called in and she states she does not use that after chemo she uses prednisone. She is wanting to clarify this.  She does have refills on her prednisone and is having the pharmacy refill this.  Please call her back clarifying the dexamethasone. Cell phone [phone]

## 2016-11-17 NOTE — Progress Notes (Signed)
Subjective:    Patient ID: Jo Collins, female    DOB: 1950/11/10, 66 y.o.   MRN: [id number]  PCP Deland Pretty, MD  HPI  IOV 11/17/2016  Chief Complaint  Patient presents with  . Advice Only    Referred by Dr. Shelia Media. Pt was dx with cancer in July 2018, states that she has had fluid building up around lungs and fluid was pulled from around lungs multiple times with the last time being done was 2 weeks ago.  PET scan done 10/22/16. Denies any complaint of cough, SOB, or CP.    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  History is also reviewed from the outside chart of primary care physician Dr. Shelia Media and review of the oncologist notes.  Patient tells me that in July 2018 she started having shortness of breath and pleural effusion and was ultimately diagnosed by July/August 2018 with B-cell lymphoma.  She is undergoing chemotherapy with Dr. Irene Limbo.  She tells me that the most recent CT scan of the chest shows improvement in  her cancer.  She has upcoming follow-up PET scan later in 2018 in December.  However her course has been characterized by dyspnea associated with large right pleural effusion.  She says she is at least had 5 thoracentesis.  I reviewed the cytology results and they have either mesothelial cells are lymphocyte predominance.  Presumably this is because of malignancy.  Thoracentesis large volume thoracentesis.  It is associated with relief in dyspnea.  She says she was requiring thoracentesis at least every few weeks.  Most recently her thoracentesis was end of October 2018.  She feels that this time the amount of fluid was less and she does not feel a recurrence.  It correlates with improvement in her cancer status with the chemotherapy and improvement of that.  Therefore she does not feel the need for a Pleurx catheter or pleurodesis.  She is happy with an expectant approach with multiple repeated thoracentesis.  Her expectation is that as the cancer continues to respond to  chemotherapy the effusions will improve.  I personally visualized all the images done in 2018 including CT scan of the chest and chest x-ray and I confirmed with the findings.  I also visualized the pathology report.   has a past medical history of GERD (gastroesophageal reflux disease) and NHL (non-Hodgkin's lymphoma) ([place]).   reports that she quit smoking about 3 months ago. Her smoking use included cigarettes. She has a 23.50 pack-year smoking history. she has never used smokeless tobacco.  Past Surgical History:  Procedure Laterality Date  . ABDOMINAL HYSTERECTOMY    . IR CV LINE INJECTION  10/26/2016  . IR FLUORO GUIDE PORT INSERTION RIGHT  08/18/2016  . IR FLUORO GUIDE PORT INSERTION RIGHT  11/03/2016  . IR REMOVAL TUN CV CATH W/O FL  11/03/2016  . IR THORACENTESIS ASP PLEURAL SPACE W/IMG GUIDE  08/07/2016  . IR THORACENTESIS ASP PLEURAL SPACE W/IMG GUIDE  08/14/2016  . IR US GUIDE VASC ACCESS RIGHT  08/18/2016  . IR US GUIDE VASC ACCESS RIGHT  11/03/2016    No Known Allergies  Immunization History  Administered Date(s) Administered  . Influenza, High Dose Seasonal PF 11/10/2016  . Pneumococcal Polysaccharide-23 09/25/2016    Family History  Problem Relation Age of Onset  . COPD Mother   . Diabetes Mellitus II Sister   . Diabetes Mellitus II Brother   . Diabetes Mellitus II Sister   . Stroke Sister   . Hypertension  Sister   . Diabetes Mellitus II Sister      Current Outpatient Medications:  .  acetaminophen (TYLENOL) 500 MG tablet, Take 1,000 mg by mouth every 6 (six) hours as needed for moderate pain., Disp: , Rfl:  .  diphenhydrAMINE (BENADRYL) 25 MG tablet, Take 25-50 mg by mouth daily as needed for allergies (chemotherapy infusion). , Disp: , Rfl:  .  fluticasone (FLONASE) 50 MCG/ACT nasal spray, Place 2 sprays into both nostrils daily., Disp: 16 g, Rfl: 0 .  furosemide (LASIX) 20 MG tablet, Take 1 tablet (20 mg total) by mouth daily., Disp: 30 tablet, Rfl: 1 .   lidocaine-prilocaine (EMLA) cream, Apply 1 application topically as needed. Apply to port-a-cath assess site 45-60 mins prior to chemotherapy., Disp: 30 g, Rfl: 6 .  loratadine (CLARITIN) 10 MG tablet, Take 10 mg by mouth daily as needed for allergies., Disp: , Rfl:  .  Multiple Vitamin (MULTIVITAMIN WITH MINERALS) TABS tablet, Take 1 tablet by mouth daily., Disp: , Rfl:  .  ondansetron (ZOFRAN) 4 MG tablet, Take 4 mg by mouth every 8 (eight) hours as needed for nausea or vomiting., Disp: , Rfl:  .  polyethylene glycol (MIRALAX / GLYCOLAX) packet, Take 17 g by mouth daily., Disp: 14 each, Rfl: 0 .  predniSONE (DELTASONE) 20 MG tablet, Take 3 tablets (60 mg total) by mouth daily. Take on days 1-5 of chemotherapy., Disp: 20 tablet, Rfl: 5 .  prochlorperazine (COMPAZINE) 10 MG tablet, Take 1 tablet (10 mg total) by mouth every 6 (six) hours as needed (Nausea or vomiting)., Disp: 30 tablet, Rfl: 6 .  traZODone (DESYREL) 50 MG tablet, Take 1 tablet (50 mg total) at bedtime as needed by mouth for sleep., Disp: 30 tablet, Rfl: 0 .  vitamin B-12 500 MCG tablet, Take 1 tablet (500 mcg total) by mouth daily. (Patient taking differently: Take 500 mcg by mouth every other day. ), Disp: 30 tablet, Rfl: 0 .  benzonatate (TESSALON) 100 MG capsule, Take 1 capsule (100 mg total) by mouth 3 (three) times daily as needed for cough. (Patient not taking: Reported on 11/17/2016), Disp: 20 capsule, Rfl: 0 .  HYDROcodone-homatropine (HYCODAN) 5-1.5 MG/5ML syrup, Take 5 mLs by mouth 3 (three) times daily. (Patient not taking: Reported on 11/17/2016), Disp: 120 mL, Rfl: 0    Review of Systems  Constitutional: Positive for unexpected weight change. Negative for fever.  HENT: Positive for dental problem. Negative for congestion, ear pain, nosebleeds, postnasal drip, rhinorrhea, sinus pressure, sneezing, sore throat and trouble swallowing.   Eyes: Positive for discharge. Negative for redness and itching.  Respiratory: Negative  for cough, chest tightness, shortness of breath and wheezing.   Cardiovascular: Negative for palpitations and leg swelling.  Gastrointestinal: Negative for nausea and vomiting.  Genitourinary: Negative for dysuria.  Musculoskeletal: Negative for joint swelling.  Skin: Negative for rash.  Allergic/Immunologic: Negative.  Negative for environmental allergies, food allergies and immunocompromised state.  Neurological: Negative for headaches.  Hematological: Does not bruise/bleed easily.  Psychiatric/Behavioral: Negative for dysphoric mood. The patient is nervous/anxious.        Objective:   Physical Exam  Constitutional: She is oriented to person, place, and time. She appears well-developed and well-nourished. No distress.  obese  HENT:  Head: Normocephalic and atraumatic.  Right Ear: External ear normal.  Left Ear: External ear normal.  Mouth/Throat: Oropharynx is clear and moist. No oropharyngeal exudate.  Eyes: Conjunctivae and EOM are normal. Pupils are equal, round, and reactive to light. Right  eye exhibits no discharge. Left eye exhibits no discharge. No scleral icterus.  Neck: Normal range of motion. Neck supple. No JVD present. No tracheal deviation present. No thyromegaly present.  Cardiovascular: Normal rate, regular rhythm, normal heart sounds and intact distal pulses. Exam reveals no gallop and no friction rub.  No murmur heard. Pulmonary/Chest: Effort normal and breath sounds normal. No respiratory distress. She has no wheezes. She has no rales. She exhibits no tenderness.  Abdominal: Soft. Bowel sounds are normal. She exhibits no distension and no mass. There is no tenderness. There is no rebound and no guarding.  Musculoskeletal: Normal range of motion. She exhibits no edema or tenderness.  Lymphadenopathy:    She has no cervical adenopathy.  Neurological: She is alert and oriented to person, place, and time. She has normal reflexes. No cranial nerve deficit. She exhibits  normal muscle tone. Coordination normal.  Skin: Skin is warm and dry. No rash noted. She is not diaphoretic. No erythema. No pallor.  Psychiatric: She has a normal mood and affect. Her behavior is normal. Judgment and thought content normal.  Vitals reviewed.  Vitals:   11/17/16 1018  BP: 124/74  Pulse: (!) 52  SpO2: 98%  Weight: 274 lb 9.6 oz (124.6 kg)  Height: 5' 1.5" (1.562 m)    Estimated body mass index is 51.04 kg/m as calculated from the following:   Height as of this encounter: 5' 1.5" (1.562 m).   Weight as of this encounter: 274 lb 9.6 oz (124.6 kg).        Assessment & Plan:     ICD-10-CM   1. 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  It seems to be responding to chemotherapy.  Agree with expectant approach with repeat thoracentesis Keep up with PET scan later in 2018  Follow-up -Sometime in January 2019 with Dr. Chase Caller -Return sooner if needed -If effusions keep recurring then Pleurx catheter would be the option    Dr. Brand Males, M.D., [facility]C.P Pulmonary and Critical Care Medicine Staff Physician [facility] Pager: [phone], If no answer or between  15:00h - 7:00h: call [phone]  11/17/2016 10:51 AM

## 2016-11-18 ENCOUNTER — Ambulatory Visit (HOSPITAL_BASED_OUTPATIENT_CLINIC_OR_DEPARTMENT_OTHER): Payer: Medicare Other

## 2016-11-18 ENCOUNTER — Ambulatory Visit: Payer: Medicare Other

## 2016-11-18 VITALS — BP 143/90 | HR 67 | Temp 98.4°F

## 2016-11-18 DIAGNOSIS — C8338 Diffuse large B-cell lymphoma, lymph nodes of multiple sites: Secondary | ICD-10-CM | POA: Diagnosis not present

## 2016-11-18 DIAGNOSIS — Z5189 Encounter for other specified aftercare: Secondary | ICD-10-CM | POA: Diagnosis not present

## 2016-11-18 MED ORDER — PEGFILGRASTIM INJECTION 6 MG/0.6ML ~~LOC~~
6.0000 mg | PREFILLED_SYRINGE | Freq: Once | SUBCUTANEOUS | Status: AC
  Administered 2016-11-18: 6 mg via SUBCUTANEOUS

## 2016-11-18 NOTE — Patient Instructions (Signed)
Pegfilgrastim injection What is this medicine? PEGFILGRASTIM (PEG fil gra stim) is a long-acting granulocyte colony-stimulating factor that stimulates the growth of neutrophils, a type of white blood cell important in the body's fight against infection. It is used to reduce the incidence of fever and infection in patients with certain types of cancer who are receiving chemotherapy that affects the bone marrow, and to increase survival after being exposed to high doses of radiation. This medicine may be used for other purposes; ask your health care provider or pharmacist if you have questions. COMMON BRAND NAME(S): Neulasta What should I tell my health care provider before I take this medicine? They need to know if you have any of these conditions: -kidney disease -latex allergy -ongoing radiation therapy -sickle cell disease -skin reactions to acrylic adhesives (On-Body Injector only) -an unusual or allergic reaction to pegfilgrastim, filgrastim, other medicines, foods, dyes, or preservatives -pregnant or trying to get pregnant -breast-feeding How should I use this medicine? This medicine is for injection under the skin. If you get this medicine at home, you will be taught how to prepare and give the pre-filled syringe or how to use the On-body Injector. Refer to the patient Instructions for Use for detailed instructions. Use exactly as directed. Tell your healthcare provider immediately if you suspect that the On-body Injector may not have performed as intended or if you suspect the use of the On-body Injector resulted in a missed or partial dose. It is important that you put your used needles and syringes in a special sharps container. Do not put them in a trash can. If you do not have a sharps container, call your pharmacist or healthcare provider to get one. Talk to your pediatrician regarding the use of this medicine in children. While this drug may be prescribed for selected conditions,  precautions do apply. Overdosage: If you think you have taken too much of this medicine contact a poison control center or emergency room at once. NOTE: This medicine is only for you. Do not share this medicine with others. What if I miss a dose? It is important not to miss your dose. Call your doctor or health care professional if you miss your dose. If you miss a dose due to an On-body Injector failure or leakage, a new dose should be administered as soon as possible using a single prefilled syringe for manual use. What may interact with this medicine? Interactions have not been studied. Give your health care provider a list of all the medicines, herbs, non-prescription drugs, or dietary supplements you use. Also tell them if you smoke, drink alcohol, or use illegal drugs. Some items may interact with your medicine. This list may not describe all possible interactions. Give your health care provider a list of all the medicines, herbs, non-prescription drugs, or dietary supplements you use. Also tell them if you smoke, drink alcohol, or use illegal drugs. Some items may interact with your medicine. What should I watch for while using this medicine? You may need blood work done while you are taking this medicine. If you are going to need a MRI, CT scan, or other procedure, tell your doctor that you are using this medicine (On-Body Injector only). What side effects may I notice from receiving this medicine? Side effects that you should report to your doctor or health care professional as soon as possible: -allergic reactions like skin rash, itching or hives, swelling of the face, lips, or tongue -dizziness -fever -pain, redness, or irritation at site   where injected -pinpoint red spots on the skin -red or dark-brown urine -shortness of breath or breathing problems -stomach or side pain, or pain at the shoulder -swelling -tiredness -trouble passing urine or change in the amount of urine Side  effects that usually do not require medical attention (report to your doctor or health care professional if they continue or are bothersome): -bone pain -muscle pain This list may not describe all possible side effects. Call your doctor for medical advice about side effects. You may report side effects to FDA at 1-800-FDA-1088. Where should I keep my medicine? Keep out of the reach of children. Store pre-filled syringes in a refrigerator between 2 and 8 degrees C (36 and 46 degrees F). Do not freeze. Keep in carton to protect from light. Throw away this medicine if it is left out of the refrigerator for more than 48 hours. Throw away any unused medicine after the expiration date. NOTE: This sheet is a summary. It may not cover all possible information. If you have questions about this medicine, talk to your doctor, pharmacist, or health care provider.  2018 Elsevier/Gold Standard (2015-12-16 12:58:03)  

## 2016-11-30 ENCOUNTER — Ambulatory Visit (HOSPITAL_COMMUNITY)
Admission: RE | Admit: 2016-11-30 | Discharge: 2016-11-30 | Disposition: A | Discharge status: Home / Self Care | Payer: Medicare Other | Source: Ambulatory Visit | Attending: Hematology | Admitting: Hematology

## 2016-11-30 ENCOUNTER — Encounter (HOSPITAL_COMMUNITY): Payer: Self-pay

## 2016-11-30 ENCOUNTER — Other Ambulatory Visit: Payer: Self-pay | Admitting: Hematology

## 2016-11-30 ENCOUNTER — Encounter: Payer: Self-pay | Admitting: Internal Medicine

## 2016-11-30 VITALS — BP 151/7 | HR 77 | Temp 98.6°F | Resp 16 | Ht 61.5 in | Wt 273.0 lb

## 2016-11-30 DIAGNOSIS — C8338 Diffuse large B-cell lymphoma, lymph nodes of multiple sites: Secondary | ICD-10-CM | POA: Insufficient documentation

## 2016-11-30 MED ORDER — LIDOCAINE HCL 1 % IJ SOLN
INTRAMUSCULAR | Status: AC
  Filled 2016-11-30: qty 20

## 2016-11-30 MED ORDER — SODIUM CHLORIDE 0.9 % IJ SOLN
Freq: Once | INTRAMUSCULAR | Status: DC
  Filled 2016-11-30: qty 0.48

## 2016-11-30 NOTE — Discharge Instructions (Signed)
Lumbar Puncture, Care After Refer to this sheet in the next few weeks. These instructions provide you with information on caring for yourself after your procedure. Your health care provider may also give you more specific instructions. Your treatment has been planned according to current medical practices, but problems sometimes occur. Call your health care provider if you have any problems or questions after your procedure.  What can I expect after the procedure? After your procedure, it is typical to have the following sensations:  Mild discomfort or pain at the insertion site.  Mild headache that is relieved with pain medicines.  Follow these instructions at home:   Lay flat for the remainder of the day.   Avoid lifting anything heavier than 10 lb (4.5 kg) for at least 12 hours after the procedure.  Drink enough fluids to keep your urine clear or pale yellow.  Contact a health care provider if:  You have fever or chills.  You have nausea or vomiting.  You have a headache that lasts for more than 2 days.  Get help right away if:  You have any numbness or tingling in your legs.  You are unable to control your bowel or bladder.  You have bleeding or swelling in your back at the insertion site.  You are dizzy or faint.  This information is not intended to replace advice given to you by your health care provider. Make sure you discuss any questions you have with your health care provider. Document Released: 12/24/2012 Document Revised: 05/27/2015 Document Reviewed: 08/27/2012 Elsevier Interactive Patient Education  2017 [employer].

## 2016-11-30 NOTE — Progress Notes (Signed)
Pt is continues to sleep lying flat. Equal rise and fall noted to chest. Husband remains at bedside. Previous VS stable, will obtain new set when patient wakes.

## 2016-11-30 NOTE — Progress Notes (Signed)
Pt is currently sleeping lying flat. Equal rise and fall noted to chest. Husband remains at bedside. Previous VS stable, will obtain new set when patient wakes.

## 2016-12-06 ENCOUNTER — Other Ambulatory Visit: Payer: Medicare Other

## 2016-12-06 ENCOUNTER — Ambulatory Visit: Payer: Medicare Other

## 2016-12-06 ENCOUNTER — Ambulatory Visit: Payer: Medicare Other | Admitting: Hematology

## 2016-12-07 ENCOUNTER — Ambulatory Visit (HOSPITAL_BASED_OUTPATIENT_CLINIC_OR_DEPARTMENT_OTHER): Payer: Medicare Other

## 2016-12-07 ENCOUNTER — Other Ambulatory Visit (HOSPITAL_BASED_OUTPATIENT_CLINIC_OR_DEPARTMENT_OTHER): Payer: Medicare Other

## 2016-12-07 ENCOUNTER — Telehealth: Payer: Self-pay | Admitting: Hematology

## 2016-12-07 ENCOUNTER — Telehealth: Payer: Self-pay

## 2016-12-07 ENCOUNTER — Ambulatory Visit (HOSPITAL_BASED_OUTPATIENT_CLINIC_OR_DEPARTMENT_OTHER): Payer: Medicare Other | Admitting: Hematology

## 2016-12-07 ENCOUNTER — Encounter: Payer: Self-pay | Admitting: Hematology

## 2016-12-07 VITALS — BP 137/82 | HR 68 | Temp 98.7°F | Resp 18

## 2016-12-07 VITALS — BP 143/102 | HR 73 | Temp 98.9°F | Resp 18 | Ht 61.5 in | Wt 272.1 lb

## 2016-12-07 DIAGNOSIS — C8338 Diffuse large B-cell lymphoma, lymph nodes of multiple sites: Secondary | ICD-10-CM

## 2016-12-07 DIAGNOSIS — Z5111 Encounter for antineoplastic chemotherapy: Secondary | ICD-10-CM

## 2016-12-07 DIAGNOSIS — I480 Paroxysmal atrial fibrillation: Secondary | ICD-10-CM | POA: Diagnosis not present

## 2016-12-07 DIAGNOSIS — Z7901 Long term (current) use of anticoagulants: Secondary | ICD-10-CM

## 2016-12-07 DIAGNOSIS — Z5112 Encounter for antineoplastic immunotherapy: Secondary | ICD-10-CM

## 2016-12-07 DIAGNOSIS — Z452 Encounter for adjustment and management of vascular access device: Secondary | ICD-10-CM | POA: Diagnosis not present

## 2016-12-07 DIAGNOSIS — J90 Pleural effusion, not elsewhere classified: Secondary | ICD-10-CM

## 2016-12-07 LAB — COMPREHENSIVE METABOLIC PANEL
ALBUMIN: 2.9 g/dL — AB (ref 3.5–5.0)
ALK PHOS: 42 U/L (ref 40–150)
ALT: 15 U/L (ref 0–55)
AST: 18 U/L (ref 5–34)
Anion Gap: 10 mEq/L (ref 3–11)
BILIRUBIN TOTAL: 0.34 mg/dL (ref 0.20–1.20)
BUN: 9.3 mg/dL (ref 7.0–26.0)
CALCIUM: 8.9 mg/dL (ref 8.4–10.4)
CHLORIDE: 109 meq/L (ref 98–109)
CO2: 24 mEq/L (ref 22–29)
CREATININE: 0.7 mg/dL (ref 0.6–1.1)
EGFR: >60 mL/min/{1.73_m2} (ref >60)
Glucose: 94 mg/dl (ref 70–140)
Potassium: 3.5 mEq/L (ref 3.5–5.1)
Sodium: 143 mEq/L (ref 136–145)
TOTAL PROTEIN: 6 g/dL — AB (ref 6.4–8.3)

## 2016-12-07 LAB — CBC & DIFF AND RETIC
BASO%: 0.3 % (ref 0.0–2.0)
BASOS ABS: 0 10*3/uL (ref 0.0–0.1)
EOS ABS: 0 10*3/uL (ref 0.0–0.5)
EOS%: 0 % (ref 0.0–7.0)
HEMATOCRIT: 27.9 % — AB (ref 34.8–46.6)
HEMOGLOBIN: 8.8 g/dL — AB (ref 11.6–15.9)
IMMATURE RETIC FRACT: 10 % (ref 1.60–10.00)
LYMPH#: 0.4 10*3/uL — AB (ref 0.9–3.3)
LYMPH%: 11.3 % — ABNORMAL LOW (ref 14.0–49.7)
MCH: 28.7 pg (ref 25.1–34.0)
MCHC: 31.5 g/dL (ref 31.5–36.0)
MCV: 90.9 fL (ref 79.5–101.0)
MONO#: 0.6 10*3/uL (ref 0.1–0.9)
MONO%: 15 % — ABNORMAL HIGH (ref 0.0–14.0)
NEUT#: 2.8 10*3/uL (ref 1.5–6.5)
NEUT%: 73.4 % (ref 38.4–76.8)
PLATELETS: 213 10*3/uL (ref 145–400)
RBC: 3.07 10*6/uL — ABNORMAL LOW (ref 3.70–5.45)
RDW: 15.6 % — AB (ref 11.2–14.5)
RETIC %: 1.21 % (ref 0.70–2.10)
RETIC CT ABS: 37.15 10*3/uL (ref 33.70–90.70)
WBC: 3.8 10*3/uL — ABNORMAL LOW (ref 3.9–10.3)

## 2016-12-07 LAB — LACTATE DEHYDROGENASE: LDH: 260 U/L — AB (ref 125–245)

## 2016-12-07 MED ORDER — DOXORUBICIN HCL CHEMO IV INJECTION 2 MG/ML
50.0000 mg/m2 | Freq: Once | INTRAVENOUS | Status: AC
  Administered 2016-12-07: 118 mg via INTRAVENOUS
  Filled 2016-12-07: qty 59

## 2016-12-07 MED ORDER — DIPHENHYDRAMINE HCL 25 MG PO CAPS
ORAL_CAPSULE | ORAL | Status: AC
  Filled 2016-12-07: qty 1

## 2016-12-07 MED ORDER — SODIUM CHLORIDE 0.9% FLUSH
10.0000 mL | INTRAVENOUS | Status: DC | PRN
  Administered 2016-12-07: 10 mL
  Filled 2016-12-07: qty 10

## 2016-12-07 MED ORDER — SODIUM CHLORIDE 0.9 % IV SOLN
750.0000 mg/m2 | Freq: Once | INTRAVENOUS | Status: AC
  Administered 2016-12-07: 1780 mg via INTRAVENOUS
  Filled 2016-12-07: qty 89

## 2016-12-07 MED ORDER — PALONOSETRON HCL INJECTION 0.25 MG/5ML
0.2500 mg | Freq: Once | INTRAVENOUS | Status: AC
  Administered 2016-12-07: 0.25 mg via INTRAVENOUS

## 2016-12-07 MED ORDER — SODIUM CHLORIDE 0.9 % IV SOLN
Freq: Once | INTRAVENOUS | Status: AC
  Administered 2016-12-07: 13:00:00 via INTRAVENOUS
  Filled 2016-12-07: qty 5

## 2016-12-07 MED ORDER — ACETAMINOPHEN 325 MG PO TABS
ORAL_TABLET | ORAL | Status: AC
  Filled 2016-12-07: qty 2

## 2016-12-07 MED ORDER — HEPARIN SOD (PORK) LOCK FLUSH 100 UNIT/ML IV SOLN
500.0000 [IU] | Freq: Once | INTRAVENOUS | Status: AC | PRN
  Administered 2016-12-07: 500 [IU]
  Filled 2016-12-07: qty 5

## 2016-12-07 MED ORDER — PALONOSETRON HCL INJECTION 0.25 MG/5ML
INTRAVENOUS | Status: AC
  Filled 2016-12-07: qty 5

## 2016-12-07 MED ORDER — DIPHENHYDRAMINE HCL 25 MG PO CAPS
ORAL_CAPSULE | ORAL | Status: AC
  Filled 2016-12-07: qty 2

## 2016-12-07 MED ORDER — VINCRISTINE SULFATE CHEMO INJECTION 1 MG/ML
2.0000 mg | Freq: Once | INTRAVENOUS | Status: AC
  Administered 2016-12-07: 2 mg via INTRAVENOUS
  Filled 2016-12-07: qty 2

## 2016-12-07 MED ORDER — DIPHENHYDRAMINE HCL 25 MG PO CAPS
50.0000 mg | ORAL_CAPSULE | Freq: Once | ORAL | Status: AC
  Administered 2016-12-07: 50 mg via ORAL

## 2016-12-07 MED ORDER — ALTEPLASE 2 MG IJ SOLR
2.0000 mg | Freq: Once | INTRAMUSCULAR | Status: AC | PRN
  Administered 2016-12-07: 2 mg
  Filled 2016-12-07: qty 2

## 2016-12-07 MED ORDER — PEGFILGRASTIM 6 MG/0.6ML ~~LOC~~ PSKT
6.0000 mg | PREFILLED_SYRINGE | Freq: Once | SUBCUTANEOUS | Status: DC
  Filled 2016-12-07: qty 0.6

## 2016-12-07 MED ORDER — ACETAMINOPHEN 325 MG PO TABS
650.0000 mg | ORAL_TABLET | Freq: Once | ORAL | Status: AC
  Administered 2016-12-07: 650 mg via ORAL

## 2016-12-07 MED ORDER — RITUXIMAB CHEMO INJECTION 500 MG/50ML
375.0000 mg/m2 | Freq: Once | INTRAVENOUS | Status: AC
  Administered 2016-12-07: 900 mg via INTRAVENOUS
  Filled 2016-12-07: qty 50

## 2016-12-07 MED ORDER — SODIUM CHLORIDE 0.9 % IV SOLN
Freq: Once | INTRAVENOUS | Status: AC
  Administered 2016-12-07: 12:00:00 via INTRAVENOUS

## 2016-12-07 NOTE — Telephone Encounter (Signed)
Per Dr. Irene Limbo, pt to receive IT methotrexate in two weeks. Pt agreeable to come on 12/21. Appointment scheduled on 12/21 at 0930. Per central scheduling, pt should arrive at 0830 for treatment. Spoke with Melia, RN in infusion. To relay appointment date and time to pt.

## 2016-12-07 NOTE — Telephone Encounter (Signed)
Scheduled appts per 12/6 sch msg. Patient will get updated schedule in infusion.

## 2016-12-07 NOTE — Progress Notes (Signed)
Jo Collins  HEMATOLOGY ONCOLOGY CLINIC NOTE  Date of service: 12/07/16  Patient Care Team: Deland Pretty, MD as PCP - General (Internal Medicine)  CC: post hospitalization f/u for management of newly diagnosed diffuse large B cell lymphoma  Diagnosis: Diffuse large B cell lymphoma  Current Treatment: R-CHOP with G-CSF support s/p R-CHOP x 2 cycles.  HISTORY OF PRESENT ILLNESS: please see original clinic note for HPI  INTERVAL HISTORY:  Jo Collins reports today for follow-up into the clinic for her lymphoma and 6th cycle of R-CHOP. Her CBC today 12/07/2016 shows Hgb 8.8, WBC 3.8. She is accompanied by her husband. She reports she is doing well overall and feels more like herself. Her energy level has increase and her appetite is improving.  No shortness of breath. Was seen by cardiology and pulmonary for her pleural effusion. Has not needed to have a thoracentesis since her last visit.  On ROS, pt denies fever, chills, night sweats, decreased appetite, abdominal pain and any other accompanying symptoms.   REVIEW OF SYSTEMS:    A 10+ POINT REVIEW OF SYSTEMS WAS OBTAINED including neurology, dermatology, psychiatry, cardiac, respiratory, lymph, extremities, GI, GU, Musculoskeletal, constitutional, breasts, reproductive, HEENT.  All pertinent positives are noted in the HPI.  All others are negative.  . Past Medical History:  Diagnosis Date  . GERD (gastroesophageal reflux disease)   . NHL (non-Hodgkin's lymphoma) [facility])     Past Surgical History:  Procedure Laterality Date  . ABDOMINAL HYSTERECTOMY    . IR CV LINE INJECTION  10/26/2016  . IR FLUORO GUIDE PORT INSERTION RIGHT  08/18/2016  . IR FLUORO GUIDE PORT INSERTION RIGHT  11/03/2016  . IR REMOVAL TUN CV CATH W/O FL  11/03/2016  . IR THORACENTESIS ASP PLEURAL SPACE W/IMG GUIDE  08/07/2016  . IR THORACENTESIS ASP PLEURAL SPACE W/IMG GUIDE  08/14/2016  . IR US GUIDE VASC ACCESS RIGHT  08/18/2016  . IR US GUIDE VASC ACCESS RIGHT  11/03/2016      . Social History   Tobacco Use  . Smoking status: Former Smoker    Packs/day: 0.50    Years: 47.00    Pack years: 23.50    Types: Cigarettes    Last attempt to quit: 07/28/2016    Years since quitting: 0.3  . Smokeless tobacco: Never Used  Substance Use Topics  . Alcohol use: No  . Drug use: No    ALLERGIES:  has No Known Allergies.  MEDICATIONS:  Current Outpatient Medications  Medication Sig Dispense Refill  . acetaminophen (TYLENOL) 500 MG tablet Take 1,000 mg by mouth every 6 (six) hours as needed for moderate pain.    . benzonatate (TESSALON) 100 MG capsule Take 1 capsule (100 mg total) by mouth 3 (three) times daily as needed for cough. (Patient not taking: Reported on 11/17/2016) 20 capsule 0  . dexamethasone (DECADRON) 4 MG tablet Take 2 tablets (8 mg total) once as needed for up to 1 dose by mouth. Take 45-60 mins prior to Intrathecal methotrexate 10 tablet 0  . diphenhydrAMINE (BENADRYL) 25 MG tablet Take 25-50 mg by mouth daily as needed for allergies (chemotherapy infusion).     . fluticasone (FLONASE) 50 MCG/ACT nasal spray Place 2 sprays into both nostrils daily. 16 g 0  . furosemide (LASIX) 20 MG tablet Take 1 tablet (20 mg total) by mouth daily. 30 tablet 1  . HYDROcodone-homatropine (HYCODAN) 5-1.5 MG/5ML syrup Take 5 mLs by mouth 3 (three) times daily. (Patient not taking: Reported on 11/17/2016) 120  mL 0  . lidocaine-prilocaine (EMLA) cream Apply 1 application topically as needed. Apply to port-a-cath assess site 45-60 mins prior to chemotherapy. 30 g 6  . loratadine (CLARITIN) 10 MG tablet Take 10 mg by mouth daily as needed for allergies.    . Multiple Vitamin (MULTIVITAMIN WITH MINERALS) TABS tablet Take 1 tablet by mouth daily.    . ondansetron (ZOFRAN) 4 MG tablet Take 4 mg by mouth every 8 (eight) hours as needed for nausea or vomiting.    . polyethylene glycol (MIRALAX / GLYCOLAX) packet Take 17 g by mouth daily. 14 each 0  . predniSONE (DELTASONE) 20  MG tablet Take 3 tablets (60 mg total) by mouth daily. Take on days 1-5 of chemotherapy. 20 tablet 5  . prochlorperazine (COMPAZINE) 10 MG tablet Take 1 tablet (10 mg total) by mouth every 6 (six) hours as needed (Nausea or vomiting). 30 tablet 6  . traZODone (DESYREL) 50 MG tablet Take 1 tablet (50 mg total) at bedtime as needed by mouth for sleep. 30 tablet 0  . vitamin B-12 500 MCG tablet Take 1 tablet (500 mcg total) by mouth daily. (Patient taking differently: Take 500 mcg by mouth every other day. ) 30 tablet 0   No current facility-administered medications for this visit.     PHYSICAL EXAMINATION: ECOG PERFORMANCE STATUS: 1 - Symptomatic but completely ambulatory  . Vitals:   12/07/16 0850  BP: (!) 143/102  Pulse: 73  Resp: 18  Temp: 98.9 F (37.2 C)  SpO2: 100%    Filed Weights   12/07/16 0850  Weight: 272 lb 1.6 oz (123.4 kg)   .Body mass index is 50.58 kg/m.  GENERAL:alert, in no acute distress and comfortable SKIN: no acute rashes, no significant lesions EYES: conjunctiva are pink and non-injected, sclera anicteric OROPHARYNX: MMM, no exudates, no oropharyngeal erythema or ulceration NECK: supple, no JVD LYMPH:  no palpable lymphadenopathy in the cervical, axillary or inguinal regions LUNGS: distant breath sounds. Decreased AE rt base about 1/4 up posterior chest wall. HEART: S1/S2 irregular rate & rhythm ABDOMEN:  normoactive bowel sounds , non tender, not distended. Extremity: grade 1 pedal edema PSYCH: alert & oriented x 3 with fluent speech NEURO: no focal motor/sensory deficits   LABORATORY DATA:   I have reviewed the data as listed  . CBC Latest Ref Rng & Units 12/07/2016 11/16/2016 11/03/2016  WBC 3.9 - 10.3 10e3/uL 3.8(L) 7.7 1.4(LL)  Hemoglobin 11.6 - 15.9 g/dL 8.8(L) 9.9(L) 9.2(L)  Hematocrit 34.8 - 46.6 % 27.9(L) 31.8(L) 28.7(L)  Platelets 145 - 400 10e3/uL 213 267 120(L)    . CMP Latest Ref Rng & Units 12/07/2016 11/16/2016 11/03/2016    Glucose 70 - 140 mg/dl 94 102 101(H)  BUN 7.0 - 26.0 mg/dL 9.3 10.7 7  Creatinine 0.6 - 1.1 mg/dL 0.7 0.8 0.77  Sodium 136 - 145 mEq/L 143 142 140  Potassium 3.5 - 5.1 mEq/L 3.5 3.4(L) 3.1(L)  Chloride 101 - 111 mmol/L - - 104  CO2 22 - 29 mEq/L 24 26 30   Calcium 8.4 - 10.4 mg/dL 8.9 9.1 8.6(L)  Total Protein 6.4 - 8.3 g/dL 6.0(L) 6.4 -  Total Bilirubin 0.20 - 1.20 mg/dL 0.34 0.29 -  Alkaline Phos 40 - 150 U/L 42 49 -  AST 5 - 34 U/L 18 16 -  ALT 0 - 55 U/L 15 11 -      RADIOGRAPHIC STUDIES: I have personally reviewed the radiological images as listed and agreed with the findings in  the report. Dg Fluoro Guided Loc Of Needle/cath Tip For Spinal Inject Lt  Addendum Date: 11/30/2016   ADDENDUM REPORT: 11/30/2016 11:53 CLINICAL DATA:  B-cell lymphoma. Intrathecal methotrexate injection. EXAM: EXAM FLUOROSCOPICALLY GUIDED LUMBAR PUNCTURE FOR INTRATHECAL CHEMOTHERAPY TECHNIQUE: TECHNIQUE Informed consent was obtained from the patient prior to the procedure, including potential complications of headache, allergy, and pain. A 'time out' was performed. With the patient prone, the lower back was prepped with Betadine. 1% Lidocaine was used for local anesthesia. Lumbar puncture was performed at the L3-4 level using a gauge needle with return of clear CSF. Opening pressure 18 cm water. 12 mg of methotrexate was injected into the subarachnoid space. The patient tolerated the procedure well without apparent complication. FLUOROSCOPY TIME:  26 seconds IMPRESSION: IMPRESSION Intrathecal injection of chemotherapy without complication Electronically Signed   By: Rolm Baptise M.D.   On: 11/30/2016 11:53   Result Date: 11/30/2016 CLINICAL DATA:  B-cell lymphoma. Intrathecal methotrexate injection. EXAM: FLUOROSCOPICALLY GUIDED LUMBAR PUNCTURE FOR INTRATHECAL CHEMOTHERAPY TECHNIQUE: Informed consent was obtained from the patient prior to the procedure, including potential complications of headache, allergy,  and pain. A 'time out' was performed. With the patient prone, the lower back was prepped with Betadine. 1% Lidocaine was used for local anesthesia. Lumbar puncture was performed at the using a gauge needle with return of clear CSF. of methotrexate was injected into the subarachnoid space. The patient tolerated the procedure well without apparent complication. FLUOROSCOPY TIME:  dictate in minutes and seconds IMPRESSION: Intrathecal injection of chemotherapy without complication Electronically Signed: By: Rolm Baptise M.D. 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  -PET scan skull base to thigh on 10/21/2016 with results revealing: IMPRESSION:1. There has been significant interval improvement in the chest, abdomen, and pelvis in the interval. The dominant mass in the chest is much smaller and less FDG avid in the interval. However, the remaining maximum uptake is slightly greater than mediastinal blood pool. The dominant remaining disease in the pelvis is an external iliac lymph node with a maximum SUV moderately greater than liver blood pool. Evaluation of the bones is more difficult today given the diffuse uptake likely associated with recent chemotherapy. I suspect the previously identified humeral lesion remains. Recommend continued attention to the bones on follow-up. [place] Category 4   #2 Recurrent right pleural effusion likely related to lymphoma possibly from decreased lymphatic drainage. Additional element of pleural effusion from ? Diastolic dysfunction +steroids with fluid retention+ atelectasis from body habitus  -Had thoracentesis during her in October with resolution of this and of her symptoms.   -no  symptoms from this currently and this appears to be stable/resolving with treatment of her   #3 Back pain with large paraspinal mass in the thoracic region with possible epidural extension. No overt neurological symptoms related to this.  No significant back pain related to this curently - this has resolved with response of her lymphoma to treatment.  Plan Labs stable and no prohibitive new toxicities Patient appropriate to proceed with 6th cycle of R-CHOP from today -IT MTX C6 around D14 -Advised to continue use of OTC Claritin or zyrtec for sinus issues.  -OTC PPI use advised to aid with GERD symptoms.  -continue  with current pre-medications.  -We will also consider referring her to RadOnc following her chemotherapy to consider ISRT to residual lymphoma/areas of bulky involvement.   -PET scan in 4 weeks   #4 Paroxysmal atrial fibrillation - last EKG in 10/2016 - NSR Plan -f/u  with PCP/Cardiology   Patient Active Problem List   Diagnosis Date Noted  . S/P thoracentesis   . Cough   . Neutrophilic leukocytosis 21/30/8657  . Recurrent pleural effusion on right 10/08/2016  . SOB (shortness of breath) 09/26/2016  . Acute respiratory distress 09/25/2016  . Chronic low back pain 09/25/2016  . Hypokalemia 09/25/2016  . Recurrent right pleural effusion 09/25/2016  . DLBCL (diffuse large B cell lymphoma) ([place]) 08/17/2016  . Acute midline thoracic back pain   . Lymphadenopathy   . Morbid obesity with BMI of 50.0-59.9, adult ([place])   . NHL (non-Hodgkin's lymphoma) ([place])   . Abdominal pain, RUQ   . Pleural effusion on right 08/07/2016  . Abnormal EKG 08/07/2016  . 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 Anemia 08/07/2016  . GERD (gastroesophageal reflux disease) 08/07/2016  . Shortness of breath 08/07/2016   -continue f/u with PCP for other medical cares and for continue mx of Paroxysmal afib. -would recommend sleep study to evaluate for sleep apnea.  -IR for Intrathecal Methotrexate in 2  weeks -PET/CT in 4 weeks  RTC with Dr Irene Limbo in 5 weeks with labs  I spent 20 minutes counseling the patient face to face. The total time spent in the appointment was 25 minutes and more than 50% was on counseling and direct patient cares.    Sullivan Lone MD [place] AAHIVMS [facility] Hematology/Oncology Physician [facility]  (Office):       [phone] (Work cell):  [phone] (Fax):           [phone]  This document serves as a record of services personally performed by Sullivan Lone, MD. It was created on his behalf by Alean Rinne, a trained medical scribe. The creation of this record is based on the scribe's personal observations and the provider's statements to them.   .I have reviewed the above documentation for accuracy and completeness, and I agree with the above. Brunetta Genera MD MS

## 2016-12-07 NOTE — Patient Instructions (Signed)
Thank you for choosing [facility] to provide your oncology and hematology care.  To afford each patient quality time with our providers, please arrive 30 minutes before your scheduled appointment time.  If you arrive late for your appointment, you may be asked to reschedule.  We strive to give you quality time with our providers, and arriving late affects you and other patients whose appointments are after yours.  If you are a no show for multiple scheduled visits, you may be dismissed from the clinic at the providers discretion.   Again, thank you for choosing [facility], our hope is that these requests will decrease the amount of time that you wait before being seen by our physicians.  ______________________________________________________________________ Should you have questions after your visit to the [facility], please contact our office at [phone] between the hours of 8:30 and 4:30 p.m.    Voicemails left after 4:30p.m will not be returned until the following business day.   For prescription refill requests, please have your pharmacy contact us directly.  Please also try to allow 48 hours for prescription requests.   Please contact the scheduling department for questions regarding scheduling.  For scheduling of procedures such as PET scans, CT scans, MRI, Ultrasound, etc please contact central scheduling at [phone].   Resources For Cancer Patients and Caregivers:  American Cancer Society:  800-227-2345  Can help patients locate various types of support and financial assistance Cancer Care: 1-800-813-HOPE (4673) Provides financial assistance, online support groups, medication/co-pay assistance.   [place] DSS:  336-641-3447 Where to apply for food stamps, Medicaid, and utility assistance Medicare Rights Center: 800-333-4114 Helps people with Medicare understand their rights and benefits, navigate the Medicare system, and secure the  quality healthcare they deserve SCAT: 336-333-6589 [employer] [employer]'s shared-ride transportation service for eligible riders who have a disability that prevents them from riding the fixed route bus.   For additional information on assistance programs please contact our social worker:   Grier Hock/Abigail Elmore:  [phone] 

## 2016-12-07 NOTE — Patient Instructions (Signed)
[facility] Discharge Instructions for Patients Receiving Chemotherapy  Today you received the following chemotherapy agents:  Adriamycin, Vincristine, Cytoxan, and Rituxan.  To help prevent nausea and vomiting after your treatment, we encourage you to take your nausea medication as directed.   If you develop nausea and vomiting that is not controlled by your nausea medication, call the clinic.   BELOW ARE SYMPTOMS THAT SHOULD BE REPORTED IMMEDIATELY:  *FEVER GREATER THAN 100.5 F  *CHILLS WITH OR WITHOUT FEVER  NAUSEA AND VOMITING THAT IS NOT CONTROLLED WITH YOUR NAUSEA MEDICATION  *UNUSUAL SHORTNESS OF BREATH  *UNUSUAL BRUISING OR BLEEDING  TENDERNESS IN MOUTH AND THROAT WITH OR WITHOUT PRESENCE OF ULCERS  *URINARY PROBLEMS  *BOWEL PROBLEMS  UNUSUAL RASH Items with * indicate a potential emergency and should be followed up as soon as possible.  Feel free to call the clinic should you have any questions or concerns. The clinic phone number is [phone].  Please show the CHEMO ALERT CARD at check-in to the Emergency Department and triage nurse.   

## 2016-12-08 ENCOUNTER — Ambulatory Visit: Payer: Medicare Other

## 2016-12-08 ENCOUNTER — Telehealth: Payer: Self-pay | Admitting: Hematology

## 2016-12-08 ENCOUNTER — Ambulatory Visit: Payer: Medicare Other | Admitting: Internal Medicine

## 2016-12-08 NOTE — Telephone Encounter (Signed)
Scheduled appt per 12/6 sch message - left message with appt date and time   

## 2016-12-09 ENCOUNTER — Ambulatory Visit (HOSPITAL_BASED_OUTPATIENT_CLINIC_OR_DEPARTMENT_OTHER): Payer: Medicare Other

## 2016-12-09 ENCOUNTER — Ambulatory Visit: Payer: Medicare Other

## 2016-12-09 VITALS — BP 143/92 | HR 64 | Temp 98.2°F | Resp 18

## 2016-12-09 DIAGNOSIS — C8338 Diffuse large B-cell lymphoma, lymph nodes of multiple sites: Secondary | ICD-10-CM

## 2016-12-09 DIAGNOSIS — Z5189 Encounter for other specified aftercare: Secondary | ICD-10-CM

## 2016-12-09 MED ORDER — PEGFILGRASTIM INJECTION 6 MG/0.6ML ~~LOC~~
6.0000 mg | PREFILLED_SYRINGE | Freq: Once | SUBCUTANEOUS | Status: AC
  Administered 2016-12-09: 6 mg via SUBCUTANEOUS

## 2016-12-22 ENCOUNTER — Ambulatory Visit (HOSPITAL_COMMUNITY)
Admission: RE | Admit: 2016-12-22 | Discharge: 2016-12-22 | Disposition: A | Discharge status: Home / Self Care | Payer: Medicare Other | Source: Ambulatory Visit | Attending: Hematology | Admitting: Hematology

## 2016-12-22 ENCOUNTER — Encounter (HOSPITAL_COMMUNITY): Payer: Self-pay

## 2016-12-22 VITALS — BP 141/70 | HR 67 | Temp 98.6°F | Resp 18 | Ht 61.5 in | Wt 262.6 lb

## 2016-12-22 DIAGNOSIS — Z5111 Encounter for antineoplastic chemotherapy: Secondary | ICD-10-CM | POA: Diagnosis present

## 2016-12-22 DIAGNOSIS — C8338 Diffuse large B-cell lymphoma, lymph nodes of multiple sites: Secondary | ICD-10-CM | POA: Insufficient documentation

## 2016-12-22 HISTORY — DX: Anemia, unspecified: D64.9

## 2016-12-22 HISTORY — DX: Shortness of breath: R06.02

## 2016-12-22 HISTORY — DX: Pain in thoracic spine: M54.6

## 2016-12-22 HISTORY — DX: Other chronic pain: G89.29

## 2016-12-22 HISTORY — DX: Morbid (severe) obesity due to excess calories: E66.01

## 2016-12-22 HISTORY — DX: Acute respiratory distress: R06.03

## 2016-12-22 HISTORY — DX: Right upper quadrant pain: R10.11

## 2016-12-22 HISTORY — DX: Generalized enlarged lymph nodes: R59.1

## 2016-12-22 HISTORY — DX: Abnormal electrocardiogram (ECG) (EKG): R94.31

## 2016-12-22 HISTORY — DX: Low back pain: M54.5

## 2016-12-22 HISTORY — DX: Pleural effusion, not elsewhere classified: J90

## 2016-12-22 HISTORY — DX: Hypokalemia: E87.6

## 2016-12-22 HISTORY — DX: Localized swelling, mass and lump, trunk: R22.2

## 2016-12-22 HISTORY — DX: Disorder of white blood cells, unspecified: D72.9

## 2016-12-22 HISTORY — DX: Cough: R05

## 2016-12-22 HISTORY — DX: Cough, unspecified: R05.9

## 2016-12-22 HISTORY — DX: Diffuse large B-cell lymphoma, unspecified site: C83.30

## 2016-12-22 HISTORY — DX: Body Mass Index (BMI) 40.0 and over, adult: Z684

## 2016-12-22 HISTORY — DX: Other specified postprocedural states: Z98.890

## 2016-12-22 MED ORDER — LIDOCAINE HCL 1 % IJ SOLN
INTRAMUSCULAR | Status: AC
  Filled 2016-12-22: qty 20

## 2016-12-22 MED ORDER — SODIUM CHLORIDE 0.9 % IJ SOLN
Freq: Once | INTRAMUSCULAR | Status: DC
  Filled 2016-12-22: qty 0.48

## 2016-12-22 NOTE — Progress Notes (Signed)
Pt awake, needing to void, assisted to bathroom and voided.  Otherwise no complaints.  Pt assisted back to bed, pt placed flat in bed.  Husband is at bedside.

## 2016-12-22 NOTE — Discharge Instructions (Signed)
Please remain flat as much as possible for the rest of the day.  For questions/concerns please call your MD at the [facility].    Hydrocortisone injection What is this medicine? HYDROCORTISONE (hye droe KOR ti sone) is a corticosteroid. It is commonly used to treat inflammation of the skin, joints, lungs, and other organs. Common conditions treated include asthma, allergies, and arthritis. It is also used for other conditions, such as blood disorders and diseases of the adrenal glands. This medicine may be used for other purposes; ask your health care provider or pharmacist if you have questions. COMMON BRAND NAME(S): A-Hydrocort, Solu-Cortef What should I tell my health care provider before I take this medicine? They need to know if you have any of these conditions: -Cushing's syndrome -diabetes -glaucoma -heart problems or disease -high blood pressure -infection like herpes, measles, tuberculosis, or chickenpox -kidney disease -liver disease -mental problems -myasthenia gravis -osteoporosis -previous heart attack -seizures -stomach, ulcer or intestine disease including colitis and diverticulitis -thyroid problem -an unusual or allergic reaction to hydrocortisone, corticosteroids, benzyl alcohol, other medicines, lactose, foods, dyes, or preservatives -pregnant or trying to get pregnant -breast-feeding How should I use this medicine? This medicine is for injection or infusion into a vein, or for injection into a muscle. It is given by a health care professional in a hospital or clinic setting. Talk to your pediatrician regarding the use of this medicine in children. Special care may be needed. Overdosage: If you think you have taken too much of this medicine contact a poison control center or emergency room at once. NOTE: This medicine is only for you. Do not share this medicine with others. What if I miss a dose? This does not apply. What may interact with this medicine? Do  not take this medicine with any of the following medications: -mifepristone, RU-486 -vaccines This medicine may also interact with the following medications: -antibiotics like clarithromycin, erythromycin, and troleandomycin -aspirin and aspirin-like drugs -barbiturates like phenobarbital -ketoconazole -phenytoin -rifampin -warfarin This list may not describe all possible interactions. Give your health care provider a list of all the medicines, herbs, non-prescription drugs, or dietary supplements you use. Also tell them if you smoke, drink alcohol, or use illegal drugs. Some items may interact with your medicine. What should I watch for while using this medicine? Visit your doctor or health care professional for regular checks on your progress. If you are taking this medicine over a prolonged period, carry an identification card with your name and address, the type and dose of your medicine, and your doctor's name and address. This medicine may increase your risk of getting an infection. Stay away from people who are sick. Tell your doctor or health care professional if you are around anyone with measles or chickenpox. If you are going to have surgery, tell your doctor or health care professional that you have taken this medicine within the last twelve months. Ask your doctor or health care professional about your diet. You may need to lower the amount of salt you eat. The medicine can increase your blood sugar. If you are a diabetic check with your doctor if you need help adjusting the dose of your diabetic medicine. What side effects may I notice from receiving this medicine? Side effects that you should report to your doctor or health care professional as soon as possible: -allergic reactions like skin rash, itching or hives, swelling of the face, lips, or tongue -bloody or black, tarry stools -changes in vision -fever, sore  throat, sneezing, cough, or other signs of infection, wounds that  will not heal -frequent passing of urine -increased thirst -mental depression, mood swings, mistaken feelings of self importance or of being mistreated -pain in hips, back, ribs, arms, shoulders, or legs -severe stomach pain -swelling of feet or lower legs -unusually weak or tired -vomiting Side effects that usually do not require medical attention (report to your doctor or health care professional if they continue or are bothersome): -headache -increased appetite -nausea -skin problems, acne, thin and shiny skin This list may not describe all possible side effects. Call your doctor for medical advice about side effects. You may report side effects to FDA at 1-800-FDA-1088. Where should I keep my medicine? This drug is given in a hospital or clinic and will not be stored at home. NOTE: This sheet is a summary. It may not cover all possible information. If you have questions about this medicine, talk to your doctor, pharmacist, or health care provider.  2018 Elsevier/Gold Standard (2007-05-03 15:57:42) Methotrexate injection What is this medicine? METHOTREXATE (METH oh TREX ate) is a chemotherapy drug used to treat cancer including breast cancer, leukemia, and lymphoma. This medicine can also be used to treat psoriasis and certain kinds of arthritis. This medicine may be used for other purposes; ask your health care provider or pharmacist if you have questions. What should I tell my health care provider before I take this medicine? They need to know if you have any of these conditions: -fluid in the stomach area or lungs -if you often drink alcohol -infection or immune system problems -kidney disease -liver disease -low blood counts, like low white cell, platelet, or red cell counts -lung disease -radiation therapy -stomach ulcers -ulcerative colitis -an unusual or allergic reaction to methotrexate, other medicines, foods, dyes, or preservatives -pregnant or trying to get  pregnant -breast-feeding How should I use this medicine? This medicine is for infusion into a vein or for injection into muscle or into the spinal fluid (whichever applies). It is usually given by a health care professional in a hospital or clinic setting. In rare cases, you might get this medicine at home. You will be taught how to give this medicine. Use exactly as directed. Take your medicine at regular intervals. Do not take your medicine more often than directed. If this medicine is used for arthritis or psoriasis, it should be taken weekly, NOT daily. It is important that you put your used needles and syringes in a special sharps container. Do not put them in a trash can. If you do not have a sharps container, call your pharmacist or healthcare provider to get one. Talk to your pediatrician regarding the use of this medicine in children. While this drug may be prescribed for children as young as 2 years for selected conditions, precautions do apply. Overdosage: If you think you have taken too much of this medicine contact a poison control center or emergency room at once. NOTE: This medicine is only for you. Do not share this medicine with others. What if I miss a dose? It is important not to miss your dose. Call your doctor or health care professional if you are unable to keep an appointment. If you give yourself the medicine and you miss a dose, talk with your doctor or health care professional. Do not take double or extra doses. What may interact with this medicine? This medicine may interact with the following medications: -acitretin -aspirin or aspirin-like medicines including salicylates -azathioprine -certain antibiotics  like chloramphenicol, penicillin, tetracycline -certain medicines for stomach problems like esomeprazole, omeprazole, pantoprazole -cyclosporine -gold -hydroxychloroquine -live virus vaccines -mercaptopurine -NSAIDs, medicines for pain and inflammation, like  ibuprofen or naproxen -other cytotoxic agents -penicillamine -phenylbutazone -phenytoin -probenacid -retinoids such as isotretinoin and tretinoin -steroid medicines like prednisone or cortisone -sulfonamides like sulfasalazine and trimethoprim/sulfamethoxazole -theophylline This list may not describe all possible interactions. Give your health care provider a list of all the medicines, herbs, non-prescription drugs, or dietary supplements you use. Also tell them if you smoke, drink alcohol, or use illegal drugs. Some items may interact with your medicine. What should I watch for while using this medicine? Avoid alcoholic drinks. In some cases, you may be given additional medicines to help with side effects. Follow all directions for their use. This medicine can make you more sensitive to the sun. Keep out of the sun. If you cannot avoid being in the sun, wear protective clothing and use sunscreen. Do not use sun lamps or tanning beds/booths. You may get drowsy or dizzy. Do not drive, use machinery, or do anything that needs mental alertness until you know how this medicine affects you. Do not stand or sit up quickly, especially if you are an older patient. This reduces the risk of dizzy or fainting spells. You may need blood work done while you are taking this medicine. Call your doctor or health care professional for advice if you get a fever, chills or sore throat, or other symptoms of a cold or flu. Do not treat yourself. This drug decreases your body's ability to fight infections. Try to avoid being around people who are sick. This medicine may increase your risk to bruise or bleed. Call your doctor or health care professional if you notice any unusual bleeding. Check with your doctor or health care professional if you get an attack of severe diarrhea, nausea and vomiting, or if you sweat a lot. The loss of too much body fluid can make it dangerous for you to take this medicine. Talk to your  doctor about your risk of cancer. You may be more at risk for certain types of cancers if you take this medicine. Both men and women must use effective birth control with this medicine. Do not become pregnant while taking this medicine or until at least 1 normal menstrual cycle has occurred after stopping it. Women should inform their doctor if they wish to become pregnant or think they might be pregnant. Men should not father a child while taking this medicine and for 3 months after stopping it. There is a potential for serious side effects to an unborn child. Talk to your health care professional or pharmacist for more information. Do not breast-feed an infant while taking this medicine. What side effects may I notice from receiving this medicine? Side effects that you should report to your doctor or health care professional as soon as possible: -allergic reactions like skin rash, itching or hives, swelling of the face, lips, or tongue -back pain -breathing problems or shortness of breath -confusion -diarrhea -dry, nonproductive cough -low blood counts - this medicine may decrease the number of white blood cells, red blood cells and platelets. You may be at increased risk of infections and bleeding -mouth sores -redness, blistering, peeling or loosening of the skin, including inside the mouth -seizures -severe headaches -signs of infection - fever or chills, cough, sore throat, pain or difficulty passing urine -signs and symptoms of bleeding such as bloody or black, tarry stools; red or  dark-brown urine; spitting up blood or brown material that looks like coffee grounds; red spots on the skin; unusual bruising or bleeding from the eye, gums, or nose -signs and symptoms of kidney injury like trouble passing urine or change in the amount of urine -signs and symptoms of liver injury like dark yellow or brown urine; general ill feeling or flu-like symptoms; light-colored stools; loss of appetite;  nausea; right upper belly pain; unusually weak or tired; yellowing of the eyes or skin -stiff neck -vomiting Side effects that usually do not require medical attention (report to your doctor or health care professional if they continue or are bothersome): -dizziness -hair loss -headache -stomach pain -upset stomach This list may not describe all possible side effects. Call your doctor for medical advice about side effects. You may report side effects to FDA at 1-800-FDA-1088. Where should I keep my medicine? If you are using this medicine at home, you will be instructed on how to store this medicine. Throw away any unused medicine after the expiration date on the label. NOTE: This sheet is a summary. It may not cover all possible information. If you have questions about this medicine, talk to your doctor, pharmacist, or health care provider.  2018 Elsevier/Gold Standard (2014-04-09 12:36:41) Lumbar Puncture, Care After Refer to this sheet in the next few weeks. These instructions provide you with information on caring for yourself after your procedure. Your health care provider may also give you more specific instructions. Your treatment has been planned according to current medical practices, but problems sometimes occur. Call your health care provider if you have any problems or questions after your procedure. What can I expect after the procedure? After your procedure, it is typical to have the following sensations:  Mild discomfort or pain at the insertion site.  Mild headache that is relieved with pain medicines.  Follow these instructions at home:   Avoid lifting anything heavier than 10 lb (4.5 kg) for at least 12 hours after the procedure.  Drink enough fluids to keep your urine clear or pale yellow. Contact a health care provider if:  You have fever or chills.  You have nausea or vomiting.  You have a headache that lasts for more than 2 days. Get help right away  if:  You have any numbness or tingling in your legs.  You are unable to control your bowel or bladder.  You have bleeding or swelling in your back at the insertion site.  You are dizzy or faint. This information is not intended to replace advice given to you by your health care provider. Make sure you discuss any questions you have with your health care provider. Document Released: 12/24/2012 Document Revised: 05/27/2015 Document Reviewed: 08/27/2012 Elsevier Interactive Patient Education  2017 [employer].

## 2016-12-22 NOTE — Progress Notes (Signed)
Vss, afebrile.  bandaid to lower back dry and intact.  Pt ready to go home, pt states she feels good.  Pt did well for this treatment, did not have diarrhea and buttock pain like in the past.  Pt d/c via wheelchair to lobby, husband is driving.  Reminded pt to stay flat as much as possible for the rest of the day.  Pt voiced understanding.

## 2016-12-22 NOTE — Progress Notes (Signed)
Pt resting comfortably.  Pt flat in bed.  Informed husband at bedside pt may be d/c at 1500, he nodded with understanding.

## 2017-01-03 MED FILL — Methotrexate Sodium Inj PF 50 MG/2ML (25 MG/ML): INTRAMUSCULAR | Qty: 12 | Status: AC

## 2017-01-05 ENCOUNTER — Encounter (HOSPITAL_COMMUNITY)
Admission: RE | Admit: 2017-01-05 | Discharge: 2017-01-05 | Disposition: A | Discharge status: Home / Self Care | Payer: Medicare Other | Source: Ambulatory Visit | Attending: Hematology | Admitting: Hematology

## 2017-01-05 DIAGNOSIS — C8338 Diffuse large B-cell lymphoma, lymph nodes of multiple sites: Secondary | ICD-10-CM | POA: Diagnosis present

## 2017-01-05 LAB — GLUCOSE, CAPILLARY: Glucose-Capillary: 105 mg/dL — ABNORMAL HIGH (ref 65–99)

## 2017-01-05 MED ORDER — FLUDEOXYGLUCOSE F - 18 (FDG) INJECTION
13.0700 | Freq: Once | INTRAVENOUS | Status: AC | PRN
  Administered 2017-01-05: 13.07 via INTRAVENOUS

## 2017-01-08 ENCOUNTER — Ambulatory Visit (INDEPENDENT_AMBULATORY_CARE_PROVIDER_SITE_OTHER): Payer: Medicare Other | Admitting: Interventional Cardiology

## 2017-01-08 ENCOUNTER — Encounter: Payer: Self-pay | Admitting: Interventional Cardiology

## 2017-01-08 VITALS — BP 124/82 | HR 74 | Ht 61.5 in | Wt 262.2 lb

## 2017-01-08 DIAGNOSIS — Z6841 Body Mass Index (BMI) 40.0 and over, adult: Secondary | ICD-10-CM | POA: Diagnosis not present

## 2017-01-08 DIAGNOSIS — I517 Cardiomegaly: Secondary | ICD-10-CM | POA: Diagnosis not present

## 2017-01-08 DIAGNOSIS — K219 Gastro-esophageal reflux disease without esophagitis: Secondary | ICD-10-CM

## 2017-01-08 DIAGNOSIS — R9431 Abnormal electrocardiogram [ECG] [EKG]: Secondary | ICD-10-CM

## 2017-01-08 DIAGNOSIS — I480 Paroxysmal atrial fibrillation: Secondary | ICD-10-CM

## 2017-01-08 DIAGNOSIS — I4891 Unspecified atrial fibrillation: Secondary | ICD-10-CM | POA: Insufficient documentation

## 2017-01-08 MED ORDER — ASPIRIN EC 81 MG PO TBEC: 81.0000 mg | DELAYED_RELEASE_TABLET | Freq: Every day | ORAL | 3 refills | Status: AC

## 2017-01-08 NOTE — Progress Notes (Signed)
Cardiology Office Note   Date:  01/08/2017   ID:  Jo Collins, DOB December 06, 1950, MRN [id number]  PCP:  Deland Pretty, MD    No chief complaint on file. PAF   Wt Readings from Last 3 Encounters:  01/08/17 262 lb 3.2 oz (118.9 kg)  12/22/16 262 lb 9.6 oz (119.1 kg)  12/07/16 272 lb 1.6 oz (123.4 kg)       History of Present Illness: Jo Collins is a 67 y.o. female who is being seen today for the evaluation of PAF at the request of Deland Pretty, MD.  PAF was last documented in 9/18.  No anticoagulation at that time due to chemo at that time.  She is here for further eval.  She was asymptomatic at the tie fo her AFib.   Echo in 8/18 showed: - Left ventricle: The cavity size was normal. Wall thickness was   increased in a pattern of moderate LVH. Systolic function was   normal. The estimated ejection fraction was in the range of 60%   to 65%. Wall motion was normal; there were no regional wall   motion abnormalities. Doppler parameters are consistent with   abnormal left ventricular relaxation (grade 1 diastolic   dysfunction). The E/e&' ratio is between 8-15, suggesting   indeterminate LV filling pressure. - Mitral valve: Mildly thickened leaflets . There was trivial   regurgitation. - Left atrium: The atrium was normal in size. - Tricuspid valve: There was trivial regurgitation. - Pulmonary arteries: PA peak pressure: 27 mm Hg (S). - Inferior vena cava: The vessel was normal in size. The   respirophasic diameter changes were in the normal range (>= 50%),   consistent with normal central venous pressure.  Denies : Chest pain. Dizziness. Leg edema. Nitroglycerin use. Orthopnea. Palpitations. Paroxysmal nocturnal dyspnea. Shortness of breath. Syncope.     Past Medical History:  Diagnosis Date  . Abdominal pain, RUQ   . Abnormal EKG 08/07/2016  . Acute midline thoracic back pain   . Acute respiratory distress 09/25/2016  . Anemia 08/07/2016  . Chronic low back pain  09/25/2016  . Cough   . DLBCL (diffuse large B cell lymphoma) ([place]) 08/17/2016  . GERD (gastroesophageal reflux disease)   . Hypokalemia 09/25/2016  . 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 Lymphadenopathy   . Morbid obesity with BMI of 50.0-59.9, adult ([place])   . Neutrophilic leukocytosis 64/03/3293  . NHL (non-Hodgkin's lymphoma) ([place])   . Pleural effusion on right 08/07/2016  . Recurrent pleural effusion on right 10/08/2016  . Recurrent right pleural effusion 09/25/2016  . S/P thoracentesis   . Shortness of breath 08/07/2016  . SOB (shortness of breath) 09/26/2016    Past Surgical History:  Procedure Laterality Date  . ABDOMINAL HYSTERECTOMY    . IR CV LINE INJECTION  10/26/2016  . IR FLUORO GUIDE PORT INSERTION RIGHT  08/18/2016  . IR FLUORO GUIDE PORT INSERTION RIGHT  11/03/2016  . IR REMOVAL TUN CV CATH W/O FL  11/03/2016  . IR THORACENTESIS ASP PLEURAL SPACE W/IMG GUIDE  08/07/2016  . IR THORACENTESIS ASP PLEURAL SPACE W/IMG GUIDE  08/14/2016  . IR US GUIDE VASC ACCESS RIGHT  08/18/2016  . IR US GUIDE VASC ACCESS RIGHT  11/03/2016     Current Outpatient Medications  Medication Sig Dispense Refill  . acetaminophen (TYLENOL) 500 MG tablet Take 1,000 mg by mouth every 6 (six) hours as needed for moderate pain.    . benzonatate (TESSALON) 100 MG capsule Take 1  capsule (100 mg total) by mouth 3 (three) times daily as needed for cough. (Patient not taking: Reported on 11/17/2016) 20 capsule 0  . dexamethasone (DECADRON) 4 MG tablet Take 2 tablets (8 mg total) once as needed for up to 1 dose by mouth. Take 45-60 mins prior to Intrathecal methotrexate 10 tablet 0  . diphenhydrAMINE (BENADRYL) 25 MG tablet Take 25-50 mg by mouth daily as needed for allergies (chemotherapy infusion).     . fluticasone (FLONASE) 50 MCG/ACT nasal spray Place 2 sprays into both nostrils daily. (Patient not taking: Reported on 12/07/2016) 16 g 0  . furosemide (LASIX) 20 MG tablet Take 1 tablet (20 mg total) by mouth daily. 30  tablet 1  . HYDROcodone-homatropine (HYCODAN) 5-1.5 MG/5ML syrup Take 5 mLs by mouth 3 (three) times daily. (Patient not taking: Reported on 11/17/2016) 120 mL 0  . lidocaine-prilocaine (EMLA) cream Apply 1 application topically as needed. Apply to port-a-cath assess site 45-60 mins prior to chemotherapy. 30 g 6  . loratadine (CLARITIN) 10 MG tablet Take 10 mg by mouth daily as needed for allergies.    . Multiple Vitamin (MULTIVITAMIN WITH MINERALS) TABS tablet Take 1 tablet by mouth daily.    . ondansetron (ZOFRAN) 4 MG tablet Take 4 mg by mouth every 8 (eight) hours as needed for nausea or vomiting.    . polyethylene glycol (MIRALAX / GLYCOLAX) packet Take 17 g by mouth daily. 14 each 0  . predniSONE (DELTASONE) 20 MG tablet Take 3 tablets (60 mg total) by mouth daily. Take on days 1-5 of chemotherapy. 20 tablet 5  . prochlorperazine (COMPAZINE) 10 MG tablet Take 1 tablet (10 mg total) by mouth every 6 (six) hours as needed (Nausea or vomiting). 30 tablet 6  . traZODone (DESYREL) 50 MG tablet Take 1 tablet (50 mg total) at bedtime as needed by mouth for sleep. (Patient not taking: Reported on 12/07/2016) 30 tablet 0  . vitamin B-12 500 MCG tablet Take 1 tablet (500 mcg total) by mouth daily. (Patient taking differently: Take 500 mcg by mouth every other day. ) 30 tablet 0   No current facility-administered medications for this visit.     Allergies:   Patient has no known allergies.    Social History:  The patient  reports that she quit smoking about 5 months ago. Her smoking use included cigarettes. She has a 23.50 pack-year smoking history. she has never used smokeless tobacco. She reports that she does not drink alcohol or use drugs.   Family History:  The patient's family history includes COPD in her mother; Diabetes Mellitus II in her brother, sister, sister, and sister; Hypertension in her sister; Stroke in her sister.    ROS:  Please see the history of present illness.   Otherwise,  review of systems are positive for .   All other systems are reviewed and negative.    PHYSICAL EXAM: VS:  BP 124/82 (BP Location: Right Arm, Patient Position: Sitting, Cuff Size: Large)   Pulse 74   Ht 5' 1.5" (1.562 m)   Wt 262 lb 3.2 oz (118.9 kg)   SpO2 98%   BMI 48.74 kg/m  , BMI Body mass index is 48.74 kg/m. GEN: Well nourished, well developed, in no acute distress  HEENT: normal  Neck: no JVD, carotid bruits, or masses Cardiac: RRR; no murmurs, rubs, or gallops,no edema  Respiratory:  clear to auscultation bilaterally, normal work of breathing GI: soft, nontender, nondistended, + BS, obese MS: no deformity or  atrophy , faint radial pulse Skin: warm and dry, no rash Neuro:  Strength and sensation are intact Psych: euthymic mood, full affect   EKG:   The ekg ordered today demonstrates NSR, no ST segment changes, LAE   Recent Labs: 10/07/2016: B Natriuretic Peptide 75.9 10/08/2016: Magnesium 1.9 12/07/2016: ALT 15; BUN 9.3; Creatinine 0.7; HGB 8.8; Platelets 213; Potassium 3.5; Sodium 143   Lipid Panel No results found for: CHOL, TRIG, HDL, CHOLHDL, VLDL, LDLCALC, LDLDIRECT   Other studies Reviewed: Additional studies/ records that were reviewed today with results demonstrating: Echo result noted above: LVH will predispose her to volume overload and atrial fibrillation.   ASSESSMENT AND PLAN:  1. PAF: I reviewed the ECG strips from August and September hospital stays.  She does have evidence of paroxysmal atrial fibrillation.  Her chads score is 2 based on her age and gender.  She denies any palpitations.  She has not been taking aspirin.  She has a PET scan pending regarding her recent treatment for cancer.  She did not want to start any anticoagulation while getting chemotherapy.  We will start aspirin 81 mg daily.  She will monitor for any symptoms of atrial fibrillation, although in the hospital, she did not recall any symptoms while having A. fib. 2. Obesity: She  would benefit from weight loss. 3. LVH: Has been on Lasix to avoid fluid overload.  She appears euvolemic. 4. GERD: Managed with medication.     Current medicines are reviewed at length with the patient today.  The patient concerns regarding her medicines were addressed.  The following changes have been made: Start aspirin  Labs/ tests ordered today include:  No orders of the defined types were placed in this encounter.   Recommend 150 minutes/week of aerobic exercise Low fat, low carb, high fiber diet recommended  Disposition:   FU in 6 months   Signed, Larae Grooms, MD  01/08/2017 9:08 AM    [facility] [address], [place], [place]  [address] Phone: [phone]; Fax: [phone]

## 2017-01-08 NOTE — Patient Instructions (Signed)
Medication Instructions:  Your physician has recommended you make the following change in your medication:   START: Aspirin 81 mg daily  Labwork: None ordered  Testing/Procedures: None ordered  Follow-Up: Your physician wants you to follow-up in: 6 months with Dr. Irish Lack. You will receive a reminder letter in the mail two months in advance. If you don't receive a letter, please call our office to schedule the follow-up appointment.   Any Other Special Instructions Will Be Listed Below (If Applicable).     If you need a refill on your cardiac medications before your next appointment, please call your pharmacy.

## 2017-01-11 ENCOUNTER — Telehealth: Payer: Self-pay | Admitting: Hematology

## 2017-01-11 ENCOUNTER — Inpatient Hospital Stay: Payer: Medicare Other | Attending: Hematology | Admitting: Hematology

## 2017-01-11 ENCOUNTER — Inpatient Hospital Stay: Payer: Medicare Other

## 2017-01-11 VITALS — BP 139/98 | HR 68 | Temp 97.7°F | Resp 18 | Ht 61.5 in | Wt 265.0 lb

## 2017-01-11 DIAGNOSIS — Z79899 Other long term (current) drug therapy: Secondary | ICD-10-CM

## 2017-01-11 DIAGNOSIS — Z7982 Long term (current) use of aspirin: Secondary | ICD-10-CM | POA: Diagnosis not present

## 2017-01-11 DIAGNOSIS — K219 Gastro-esophageal reflux disease without esophagitis: Secondary | ICD-10-CM | POA: Insufficient documentation

## 2017-01-11 DIAGNOSIS — J90 Pleural effusion, not elsewhere classified: Secondary | ICD-10-CM | POA: Diagnosis not present

## 2017-01-11 DIAGNOSIS — Z6841 Body Mass Index (BMI) 40.0 and over, adult: Secondary | ICD-10-CM | POA: Insufficient documentation

## 2017-01-11 DIAGNOSIS — C8338 Diffuse large B-cell lymphoma, lymph nodes of multiple sites: Secondary | ICD-10-CM | POA: Insufficient documentation

## 2017-01-11 DIAGNOSIS — D72829 Elevated white blood cell count, unspecified: Secondary | ICD-10-CM | POA: Diagnosis not present

## 2017-01-11 DIAGNOSIS — Z9221 Personal history of antineoplastic chemotherapy: Secondary | ICD-10-CM | POA: Insufficient documentation

## 2017-01-11 DIAGNOSIS — E876 Hypokalemia: Secondary | ICD-10-CM | POA: Insufficient documentation

## 2017-01-11 DIAGNOSIS — I517 Cardiomegaly: Secondary | ICD-10-CM | POA: Insufficient documentation

## 2017-01-11 DIAGNOSIS — I480 Paroxysmal atrial fibrillation: Secondary | ICD-10-CM | POA: Diagnosis not present

## 2017-01-11 DIAGNOSIS — R590 Localized enlarged lymph nodes: Secondary | ICD-10-CM

## 2017-01-11 DIAGNOSIS — Z87891 Personal history of nicotine dependence: Secondary | ICD-10-CM | POA: Diagnosis not present

## 2017-01-11 LAB — CMP (CANCER CENTER ONLY)
ALK PHOS: 62 U/L (ref 40–150)
ALT: 12 U/L (ref 0–55)
AST: 15 U/L (ref 5–34)
Albumin: 3.3 g/dL — ABNORMAL LOW (ref 3.5–5.0)
Anion gap: 9 (ref 3–11)
BILIRUBIN TOTAL: 0.3 mg/dL (ref 0.2–1.2)
BUN: 12 mg/dL (ref 7–26)
CALCIUM: 9.3 mg/dL (ref 8.4–10.4)
CO2: 25 mmol/L (ref 22–29)
CREATININE: 0.77 mg/dL (ref 0.60–1.10)
Chloride: 108 mmol/L (ref 98–109)
GFR, Est AFR Am: >60 mL/min (ref >60)
GFR, Estimated: >60 mL/min (ref >60)
Glucose, Bld: 93 mg/dL (ref 70–140)
Potassium: 3.5 mmol/L (ref 3.3–4.7)
Sodium: 142 mmol/L (ref 136–145)
TOTAL PROTEIN: 6.6 g/dL (ref 6.4–8.3)

## 2017-01-11 LAB — CBC WITH DIFFERENTIAL (CANCER CENTER ONLY)
Basophils Absolute: 0 10*3/uL (ref 0.0–0.1)
Basophils Relative: 1 %
Eosinophils Absolute: 0.1 10*3/uL (ref 0.0–0.5)
Eosinophils Relative: 2 %
HEMATOCRIT: 30.1 % — AB (ref 34.8–46.6)
Hemoglobin: 9.2 g/dL — ABNORMAL LOW (ref 11.6–15.9)
Lymphocytes Relative: 21 %
Lymphs Abs: 0.8 10*3/uL — ABNORMAL LOW (ref 0.9–3.3)
MCH: 28 pg (ref 25.1–34.0)
MCHC: 30.6 g/dL — AB (ref 31.5–36.0)
MCV: 91.8 fL (ref 79.5–101.0)
MONO ABS: 0.9 10*3/uL (ref 0.1–0.9)
MONOS PCT: 25 %
NEUTROS ABS: 1.9 10*3/uL (ref 1.5–6.5)
Neutrophils Relative %: 51 %
Platelet Count: 322 10*3/uL (ref 145–400)
RBC: 3.28 MIL/uL — ABNORMAL LOW (ref 3.70–5.45)
RDW: 15.9 % (ref 11.2–16.1)
WBC Count: 3.6 10*3/uL — ABNORMAL LOW (ref 3.9–10.3)

## 2017-01-11 LAB — RETICULOCYTES
RBC.: 3.28 MIL/uL — AB (ref 3.70–5.45)
Retic Count, Absolute: 88.6 10*3/uL (ref 33.7–90.7)
Retic Ct Pct: 2.7 % — ABNORMAL HIGH (ref 0.7–2.1)

## 2017-01-11 LAB — LACTATE DEHYDROGENASE: LDH: 251 U/L — ABNORMAL HIGH (ref 125–245)

## 2017-01-11 NOTE — Telephone Encounter (Signed)
Scheduled appt per 1/10 los - Gave patient AVS and calender per los.  
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
## 2017-01-22 ENCOUNTER — Other Ambulatory Visit: Payer: Self-pay | Admitting: Radiology

## 2017-01-23 ENCOUNTER — Ambulatory Visit (HOSPITAL_COMMUNITY)
Admission: RE | Admit: 2017-01-23 | Discharge: 2017-01-23 | Disposition: A | Discharge status: Home / Self Care | Payer: Medicare Other | Source: Ambulatory Visit | Attending: Hematology | Admitting: Hematology

## 2017-01-23 ENCOUNTER — Encounter (HOSPITAL_COMMUNITY): Payer: Self-pay | Admitting: *Deleted

## 2017-01-23 DIAGNOSIS — C8338 Diffuse large B-cell lymphoma, lymph nodes of multiple sites: Secondary | ICD-10-CM | POA: Diagnosis present

## 2017-01-23 HISTORY — DX: Personal history of antineoplastic chemotherapy: Z92.21

## 2017-01-23 LAB — PROTIME-INR
INR: 1.14
Prothrombin Time: 14.5 seconds (ref 11.4–15.2)

## 2017-01-23 LAB — CBC WITH DIFFERENTIAL/PLATELET
BASOS PCT: 1 %
Basophils Absolute: 0.1 10*3/uL (ref 0.0–0.1)
EOS PCT: 2 %
Eosinophils Absolute: 0.1 10*3/uL (ref 0.0–0.7)
HCT: 29.5 % — ABNORMAL LOW (ref 36.0–46.0)
Hemoglobin: 9.2 g/dL — ABNORMAL LOW (ref 12.0–15.0)
LYMPHS ABS: 1.3 10*3/uL (ref 0.7–4.0)
Lymphocytes Relative: 20 %
MCH: 28 pg (ref 26.0–34.0)
MCHC: 31.2 g/dL (ref 30.0–36.0)
MCV: 89.9 fL (ref 78.0–100.0)
MONO ABS: 0.3 10*3/uL (ref 0.1–1.0)
Monocytes Relative: 5 %
NEUTROS ABS: 4.9 10*3/uL (ref 1.7–7.7)
Neutrophils Relative %: 72 %
PLATELETS: 198 10*3/uL (ref 150–400)
RBC: 3.28 MIL/uL — ABNORMAL LOW (ref 3.87–5.11)
RDW: 15.2 % (ref 11.5–15.5)
WBC: 6.7 10*3/uL (ref 4.0–10.5)

## 2017-01-23 MED ORDER — FENTANYL CITRATE (PF) 100 MCG/2ML IJ SOLN
INTRAMUSCULAR | Status: AC
  Filled 2017-01-23: qty 2

## 2017-01-23 MED ORDER — LIDOCAINE-EPINEPHRINE (PF) 2 %-1:200000 IJ SOLN
INTRAMUSCULAR | Status: AC | PRN
  Administered 2017-01-23: 20 mL

## 2017-01-23 MED ORDER — FENTANYL CITRATE (PF) 100 MCG/2ML IJ SOLN
INTRAMUSCULAR | Status: AC | PRN
  Administered 2017-01-23: 50 ug via INTRAVENOUS

## 2017-01-23 MED ORDER — MIDAZOLAM HCL 2 MG/2ML IJ SOLN
INTRAMUSCULAR | Status: AC
  Filled 2017-01-23: qty 4

## 2017-01-23 MED ORDER — MIDAZOLAM HCL 2 MG/2ML IJ SOLN
INTRAMUSCULAR | Status: AC | PRN
  Administered 2017-01-23: 1 mg via INTRAVENOUS

## 2017-01-23 MED ORDER — SODIUM CHLORIDE 0.9 % IV SOLN
INTRAVENOUS | Status: DC
  Administered 2017-01-23: 08:00:00 via INTRAVENOUS

## 2017-01-23 NOTE — H&P (Signed)
Referring Physician(s): Brunetta Genera  Supervising Physician: Sandi Mariscal  Patient Status:  [facility] OP  Chief Complaint:  "I'm here for a biopsy"  Subjective: Patient familiar to IR service from prior thoracenteses, right inguinal lymph node biopsy and Port-A-Cath placement in August 2018, and port revision November 2018.  She has a history of diffuse large B-cell lymphoma and recent PET scan showing new areas of involvement with slightly progressive pelvic adenopathy.  She presents today for image guided right external iliac/pelvic side wall lymph node biopsy for further evaluation.  She currently denies fever, headache, chest pain, dyspnea, cough, abdominal pain, nausea, vomiting or bleeding.  She does have intermittent back pain.  Past Medical History:  Diagnosis Date  . Abdominal pain, RUQ   . Abnormal EKG 08/07/2016  . Acute midline thoracic back pain   . Acute respiratory distress 09/25/2016  . Anemia 08/07/2016  . Chronic low back pain 09/25/2016  . Cough   . DLBCL (diffuse large B cell lymphoma) ([place]) 08/17/2016  . GERD (gastroesophageal reflux disease)   . History of chemotherapy   . Hypokalemia 09/25/2016  . 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 Lymphadenopathy   . Morbid obesity with BMI of 50.0-59.9, adult ([place])   . Neutrophilic leukocytosis 62/08/3660  . NHL (non-Hodgkin's lymphoma) ([place])   . Pleural effusion on right 08/07/2016  . Recurrent pleural effusion on right 10/08/2016  . Recurrent right pleural effusion 09/25/2016  . S/P thoracentesis   . Shortness of breath 08/07/2016  . SOB (shortness of breath) 09/26/2016   Past Surgical History:  Procedure Laterality Date  . ABDOMINAL HYSTERECTOMY    . IR CV LINE INJECTION  10/26/2016  . IR FLUORO GUIDE PORT INSERTION RIGHT  08/18/2016  . IR FLUORO GUIDE PORT INSERTION RIGHT  11/03/2016  . IR REMOVAL TUN CV CATH W/O FL  11/03/2016  . IR THORACENTESIS ASP PLEURAL SPACE W/IMG GUIDE  08/07/2016  . IR THORACENTESIS ASP PLEURAL SPACE  W/IMG GUIDE  08/14/2016  . IR US GUIDE VASC ACCESS RIGHT  08/18/2016  . IR US GUIDE VASC ACCESS RIGHT  11/03/2016     Allergies: Patient has no known allergies.  Medications: Prior to Admission medications   Medication Sig Start Date End Date Taking? Authorizing Provider  acetaminophen (TYLENOL) 500 MG tablet Take 1,000 mg by mouth every 6 (six) hours as needed for moderate pain.   Yes [provider]  aspirin EC 81 MG tablet Take 1 tablet (81 mg total) by mouth daily. 01/08/17  Yes Fay Records, MD  furosemide (LASIX) 20 MG tablet Take 1 tablet (20 mg total) by mouth daily. 10/11/16   Eugenie Filler, MD  Multiple Vitamin (MULTIVITAMIN WITH MINERALS) TABS tablet Take 1 tablet by mouth daily.    [provider]     Vital Signs: BP 132/71 (BP Location: Right Wrist)   Pulse 70   Temp 99.3 F (37.4 C) (Oral)   Resp 16   SpO2 99%   Physical Exam awake, alert.  Chest clear to auscultation bilaterally.  Clean, intact right chest wall Port-A-Cath.  Heart with regular rate and rhythm.  Abdomen obese, soft, positive bowel sounds, nontender.  Extremities with full range of motion.  Imaging: No results found.  Labs:  CBC: Recent Labs    11/03/16 1030 11/16/16 0926 12/07/16 0823 01/11/17 1116 01/23/17 0727  WBC 1.4* 7.7 3.8* 3.6* 6.7  HGB 9.2* 9.9* 8.8*  --  9.2*  HCT 28.7* 31.8* 27.9* 30.1* 29.5*  PLT 120*  267 213 322 198    COAGS: Recent Labs    08/18/16 1057 09/25/16 1810 11/03/16 1030 01/23/17 0727  INR 1.16 1.10 1.09 1.14  APTT 28 28  --   --     BMP: Recent Labs    10/09/16 0402 10/10/16 0850 10/25/16 0907 11/03/16 1030 11/16/16 0926 12/07/16 0823 01/11/17 1116  NA 140 138 143 140 142 143 142  K 3.9 3.8 3.7 3.1* 3.4* 3.5 3.5  CL 106 103  --  104  --   --  108  CO2 26 28 27 30 26 24 25   GLUCOSE 103* 111* 116 101* 102 94 93  BUN 20 19 8.8 7 10.7 9.3 12  CALCIUM 8.9 8.8* 9.1 8.6* 9.1 8.9 9.3  CREATININE 0.79 0.83 0.8 0.77 0.8 0.7  --    GFRNONAA >60 >60  --  >60  --   --  >60  GFRAA >60 >60  --  >60  --   --  >60    LIVER FUNCTION TESTS: Recent Labs    10/25/16 0907 11/16/16 0926 12/07/16 0823 01/11/17 1116  BILITOT 0.40 0.29 0.34 0.3  AST 15 16 18 15   ALT 11 11 15 12   ALKPHOS 45 49 42 62  PROT 6.4 6.4 6.0* 6.6  ALBUMIN 2.9* 3.0* 2.9* 3.3*    Assessment and Plan: Pt with history of diffuse large B-cell lymphoma and recent PET scan showing new areas of involvement with slightly progressive pelvic adenopathy.  She presents today for image guided right external iliac/pelvic side wall lymph node biopsy for further evaluation.Risks and benefits discussed with the patient/spouse including, but not limited to bleeding, infection, damage to adjacent structures or low yield requiring additional tests.All of the patient's questions were answered, patient is agreeable to proceed.Consent signed and in chart.      Electronically Signed: D. Rowe Robert, PA-C 01/23/2017, 8:04 AM   I spent a total of 20 minutes at the the patient's bedside and on the patient's hospital floor or unit, greater than 50% of which was counseling/coordinating care for image guided right external iliac/pelvic sidewall lymph node biopsy

## 2017-01-23 NOTE — Discharge Instructions (Signed)
You may take your dressing off and bathe in 24 hours.     Needle Biopsy, Care After These instructions give you information about caring for yourself after your procedure. Your doctor may also give you more specific instructions. Call your doctor if you have any problems or questions after your procedure. Follow these instructions at home:  Rest as told by your doctor.  Take medicines only as told by your doctor.  There are many different ways to close and cover the biopsy site, including stitches (sutures), skin glue, and adhesive strips. Follow instructions from your doctor about: ? How to take care of your biopsy site. ? When and how you should change your bandage (dressing). ? When you should remove your dressing. ? Removing whatever was used to close your biopsy site.  Check your biopsy site every day for signs of infection. Watch for: ? Redness, swelling, or pain. ? Fluid, blood, or pus. Contact a doctor if:  You have a fever.  You have redness, swelling, or pain at the biopsy site, and it lasts longer than a few days.  You have fluid, blood, or pus coming from the biopsy site.  You feel sick to your stomach (nauseous).  You throw up (vomit). Get help right away if:  You are short of breath.  You have trouble breathing.  Your chest hurts.  You feel dizzy or you pass out (faint).  You have bleeding that does not stop with pressure or a bandage.  You cough up blood.  Your belly (abdomen) hurts. This information is not intended to replace advice given to you by your health care provider. Make sure you discuss any questions you have with your health care provider. Document Released: 12/02/2007 Document Revised: 05/27/2015 Document Reviewed: 12/15/2013 Elsevier Interactive Patient Education  2018 [place].  Moderate Conscious Sedation, Adult, Care After These instructions provide you with information about caring for yourself after your procedure. Your health  care provider may also give you more specific instructions. Your treatment has been planned according to current medical practices, but problems sometimes occur. Call your health care provider if you have any problems or questions after your procedure. What can I expect after the procedure? After your procedure, it is common:  To feel sleepy for several hours.  To feel clumsy and have poor balance for several hours.  To have poor judgment for several hours.  To vomit if you eat too soon.  Follow these instructions at home: For at least 24 hours after the procedure:   Do not: ? Participate in activities where you could fall or become injured. ? Drive. ? Use heavy machinery. ? Drink alcohol. ? Take sleeping pills or medicines that cause drowsiness. ? Make important decisions or sign legal documents. ? Take care of children on your own.  Rest. Eating and drinking  Follow the diet recommended by your health care provider.  If you vomit: ? Drink water, juice, or soup when you can drink without vomiting. ? Make sure you have little or no nausea before eating solid foods. General instructions  Have a responsible adult stay with you until you are awake and alert.  Take over-the-counter and prescription medicines only as told by your health care provider.  If you smoke, do not smoke without supervision.  Keep all follow-up visits as told by your health care provider. This is important. Contact a health care provider if:  You keep feeling nauseous or you keep vomiting.  You feel light-headed.  You develop a rash.  You have a fever. Get help right away if:  You have trouble breathing. This information is not intended to replace advice given to you by your health care provider. Make sure you discuss any questions you have with your health care provider. Document Released: 10/09/2012 Document Revised: 05/24/2015 Document Reviewed: 04/10/2015 Elsevier Interactive Patient  Education  [employer].

## 2017-01-23 NOTE — Procedures (Signed)
Pre procedural Dx: Right external iliac lymphadenopathy  Post procedural Dx: Same  Technically successful CT guided biopsy of hypermetabolic right external iliac lymph node   EBL: None.   Complications: None immediate.   Ronny Bacon, MD Pager #: [phone]

## 2017-01-26 ENCOUNTER — Ambulatory Visit: Payer: Medicare Other | Admitting: Internal Medicine

## 2017-02-05 ENCOUNTER — Inpatient Hospital Stay: Payer: Medicare Other | Attending: Hematology

## 2017-02-05 ENCOUNTER — Inpatient Hospital Stay (HOSPITAL_COMMUNITY)
Admission: AD | Admit: 2017-02-05 | Discharge: 2017-02-10 | DRG: 841 | Disposition: A | Discharge status: Home / Self Care | Payer: Medicare Other | Source: Ambulatory Visit | Attending: Internal Medicine | Admitting: Internal Medicine

## 2017-02-05 ENCOUNTER — Other Ambulatory Visit: Payer: Self-pay

## 2017-02-05 ENCOUNTER — Encounter: Payer: Self-pay | Admitting: Hematology

## 2017-02-05 ENCOUNTER — Telehealth: Payer: Self-pay

## 2017-02-05 ENCOUNTER — Inpatient Hospital Stay (HOSPITAL_BASED_OUTPATIENT_CLINIC_OR_DEPARTMENT_OTHER): Payer: Medicare Other | Admitting: Hematology

## 2017-02-05 VITALS — BP 100/86 | HR 94 | Temp 99.3°F | Resp 24 | Ht 61.5 in | Wt 254.3 lb

## 2017-02-05 DIAGNOSIS — I480 Paroxysmal atrial fibrillation: Secondary | ICD-10-CM | POA: Insufficient documentation

## 2017-02-05 DIAGNOSIS — Z5111 Encounter for antineoplastic chemotherapy: Secondary | ICD-10-CM | POA: Insufficient documentation

## 2017-02-05 DIAGNOSIS — Z6841 Body Mass Index (BMI) 40.0 and over, adult: Secondary | ICD-10-CM

## 2017-02-05 DIAGNOSIS — Z87891 Personal history of nicotine dependence: Secondary | ICD-10-CM

## 2017-02-05 DIAGNOSIS — M549 Dorsalgia, unspecified: Secondary | ICD-10-CM | POA: Diagnosis not present

## 2017-02-05 DIAGNOSIS — G629 Polyneuropathy, unspecified: Secondary | ICD-10-CM | POA: Diagnosis present

## 2017-02-05 DIAGNOSIS — L659 Nonscarring hair loss, unspecified: Secondary | ICD-10-CM

## 2017-02-05 DIAGNOSIS — M5442 Lumbago with sciatica, left side: Secondary | ICD-10-CM

## 2017-02-05 DIAGNOSIS — C8228 Follicular lymphoma grade III, unspecified, lymph nodes of multiple sites: Secondary | ICD-10-CM

## 2017-02-05 DIAGNOSIS — D696 Thrombocytopenia, unspecified: Secondary | ICD-10-CM | POA: Diagnosis present

## 2017-02-05 DIAGNOSIS — R200 Anesthesia of skin: Secondary | ICD-10-CM | POA: Diagnosis present

## 2017-02-05 DIAGNOSIS — Z79899 Other long term (current) drug therapy: Secondary | ICD-10-CM

## 2017-02-05 DIAGNOSIS — K219 Gastro-esophageal reflux disease without esophagitis: Secondary | ICD-10-CM | POA: Insufficient documentation

## 2017-02-05 DIAGNOSIS — D72829 Elevated white blood cell count, unspecified: Secondary | ICD-10-CM | POA: Insufficient documentation

## 2017-02-05 DIAGNOSIS — I4891 Unspecified atrial fibrillation: Secondary | ICD-10-CM | POA: Diagnosis present

## 2017-02-05 DIAGNOSIS — D638 Anemia in other chronic diseases classified elsewhere: Secondary | ICD-10-CM | POA: Diagnosis present

## 2017-02-05 DIAGNOSIS — E876 Hypokalemia: Secondary | ICD-10-CM | POA: Insufficient documentation

## 2017-02-05 DIAGNOSIS — R222 Localized swelling, mass and lump, trunk: Secondary | ICD-10-CM

## 2017-02-05 DIAGNOSIS — C8330 Diffuse large B-cell lymphoma, unspecified site: Secondary | ICD-10-CM | POA: Diagnosis present

## 2017-02-05 DIAGNOSIS — C8338 Diffuse large B-cell lymphoma, lymph nodes of multiple sites: Secondary | ICD-10-CM | POA: Insufficient documentation

## 2017-02-05 DIAGNOSIS — R935 Abnormal findings on diagnostic imaging of other abdominal regions, including retroperitoneum: Secondary | ICD-10-CM

## 2017-02-05 DIAGNOSIS — D61818 Other pancytopenia: Secondary | ICD-10-CM

## 2017-02-05 DIAGNOSIS — R52 Pain, unspecified: Secondary | ICD-10-CM

## 2017-02-05 DIAGNOSIS — G4730 Sleep apnea, unspecified: Secondary | ICD-10-CM | POA: Diagnosis present

## 2017-02-05 DIAGNOSIS — M48061 Spinal stenosis, lumbar region without neurogenic claudication: Secondary | ICD-10-CM | POA: Diagnosis present

## 2017-02-05 DIAGNOSIS — Z9221 Personal history of antineoplastic chemotherapy: Secondary | ICD-10-CM

## 2017-02-05 DIAGNOSIS — Z7689 Persons encountering health services in other specified circumstances: Secondary | ICD-10-CM | POA: Insufficient documentation

## 2017-02-05 DIAGNOSIS — R05 Cough: Secondary | ICD-10-CM

## 2017-02-05 DIAGNOSIS — J90 Pleural effusion, not elsewhere classified: Secondary | ICD-10-CM | POA: Diagnosis present

## 2017-02-05 DIAGNOSIS — M79609 Pain in unspecified limb: Secondary | ICD-10-CM | POA: Diagnosis not present

## 2017-02-05 DIAGNOSIS — R7881 Bacteremia: Secondary | ICD-10-CM

## 2017-02-05 DIAGNOSIS — Z7982 Long term (current) use of aspirin: Secondary | ICD-10-CM

## 2017-02-05 DIAGNOSIS — R059 Cough, unspecified: Secondary | ICD-10-CM

## 2017-02-05 DIAGNOSIS — R591 Generalized enlarged lymph nodes: Secondary | ICD-10-CM | POA: Diagnosis not present

## 2017-02-05 LAB — CBC WITH DIFFERENTIAL (CANCER CENTER ONLY)
BASOS ABS: 0.4 10*3/uL — AB (ref 0.0–0.1)
Basophils Relative: 2 %
EOS ABS: 0.6 10*3/uL — AB (ref 0.0–0.5)
EOS PCT: 3 %
HCT: 33.8 % — ABNORMAL LOW (ref 34.8–46.6)
Hemoglobin: 10.5 g/dL — ABNORMAL LOW (ref 11.6–15.9)
Lymphocytes Relative: 13 %
Lymphs Abs: 2.9 10*3/uL (ref 0.9–3.3)
MCH: 27 pg (ref 25.1–34.0)
MCHC: 31.1 g/dL — ABNORMAL LOW (ref 31.5–36.0)
MCV: 86.9 fL (ref 79.5–101.0)
Monocytes Absolute: 1.4 10*3/uL — ABNORMAL HIGH (ref 0.1–0.9)
Monocytes Relative: 6 %
Neutro Abs: 17.5 10*3/uL — ABNORMAL HIGH (ref 1.5–6.5)
Neutrophils Relative %: 76 %
PLATELETS: 204 10*3/uL (ref 145–400)
RBC: 3.89 MIL/uL (ref 3.70–5.45)
RDW: 15.2 % — AB (ref 11.2–14.5)
WBC: 22.7 10*3/uL — AB (ref 3.9–10.3)

## 2017-02-05 LAB — CMP (CANCER CENTER ONLY)
ALK PHOS: 63 U/L (ref 40–150)
ALT: 14 U/L (ref 0–55)
AST: 37 U/L — ABNORMAL HIGH (ref 5–34)
Albumin: 3.1 g/dL — ABNORMAL LOW (ref 3.5–5.0)
Anion gap: 13 — ABNORMAL HIGH (ref 3–11)
BUN: 12 mg/dL (ref 7–26)
CALCIUM: 9.4 mg/dL (ref 8.4–10.4)
CO2: 22 mmol/L (ref 22–29)
CREATININE: 1.02 mg/dL (ref 0.60–1.10)
Chloride: 105 mmol/L (ref 98–109)
GFR, EST NON AFRICAN AMERICAN: 56 mL/min — AB (ref >60)
Glucose, Bld: 94 mg/dL (ref 70–140)
Potassium: 3.5 mmol/L (ref 3.5–5.1)
Sodium: 140 mmol/L (ref 136–145)
Total Bilirubin: 0.5 mg/dL (ref 0.2–1.2)
Total Protein: 6.8 g/dL (ref 6.4–8.3)

## 2017-02-05 LAB — LACTATE DEHYDROGENASE: LDH: 1961 U/L — ABNORMAL HIGH (ref 125–245)

## 2017-02-05 MED ORDER — ENOXAPARIN SODIUM 60 MG/0.6ML ~~LOC~~ SOLN
60.0000 mg | SUBCUTANEOUS | Status: DC
  Administered 2017-02-06 – 2017-02-08 (×3): 60 mg via SUBCUTANEOUS
  Filled 2017-02-05 (×3): qty 0.6

## 2017-02-05 MED ORDER — OXYCODONE-ACETAMINOPHEN 5-325 MG PO TABS
ORAL_TABLET | ORAL | Status: AC
  Filled 2017-02-05: qty 1

## 2017-02-05 MED ORDER — ASPIRIN EC 81 MG PO TBEC
81.0000 mg | DELAYED_RELEASE_TABLET | Freq: Every day | ORAL | Status: DC
  Administered 2017-02-06 – 2017-02-10 (×5): 81 mg via ORAL
  Filled 2017-02-05 (×5): qty 1

## 2017-02-05 MED ORDER — OXYCODONE-ACETAMINOPHEN 5-325 MG PO TABS
1.0000 | ORAL_TABLET | Freq: Once | ORAL | Status: AC
  Administered 2017-02-05: 1 via ORAL

## 2017-02-05 MED ORDER — MORPHINE SULFATE (PF) 2 MG/ML IV SOLN
2.0000 mg | Freq: Once | INTRAVENOUS | Status: DC
  Filled 2017-02-05: qty 1

## 2017-02-05 MED ORDER — OXYCODONE-ACETAMINOPHEN 5-325 MG PO TABS
1.0000 | ORAL_TABLET | Freq: Four times a day (QID) | ORAL | Status: DC | PRN
  Administered 2017-02-05 – 2017-02-08 (×11): 1 via ORAL
  Filled 2017-02-05 (×11): qty 1

## 2017-02-05 NOTE — Progress Notes (Addendum)
[name]  HEMATOLOGY ONCOLOGY CLINIC NOTE  Date of service: 02/05/17  Patient Care Team: Deland Pretty, MD as PCP - General (Internal Medicine)  CC: post hospitalization f/u for management of newly diagnosed diffuse large B cell lymphoma  Diagnosis: Diffuse large B cell lymphoma  Current Treatment: R-CHOP with G-CSF support s/p R-CHOP x 6 cycles.  HISTORY OF PRESENT ILLNESS: please see original clinic note for HPI  INTERVAL HISTORY:  Jo Collins reports today for follow-up in the clinic for her diffuse large B-cell lymphoma and to discuss results of recent pelvic LN biopsy. LN bx on 01/23/17 unfortunately shows residual LN concerning for relapsed/refractory DLBCL. She noticed top of her left foot is numb, her left ankle is tender, left hip is tender. She also notes that for the last month her jaw and mouth have felt as if she had novacane on it- particularly having numbness of the chin.  She denies headaches, neck pain, changes in vision. She notes that she received infrared treatments from a paradontist on her lower teeth several months ago. She notes the symptoms do not change, and are persistent. She notes that when she lies down, her whole left leg hurts beginning at her buttocks. She reports some pain in the arms  She reports some pain at the top of her buttocks that is tender to touch but not painful. She denies fevers, chills and night sweats. . She denies sore throat, cough and cold. She notes that her urination is not painful, less frequent and less forceful. She denies herniated disks. She notes that she was laying on her side during the Bx. She denies any abnormal vaginal discharge, and other swellings or bumps. She notes that she had constipation which she associates with taking pain pills.  She is very hesitant to be admitted today for her presenting symptoms due to feeling as though she is unable to miss work right now.  Of note since the patient last visit, pt has had tissue flow  cytometry on LN biopsycompleted on 01/23/17 with results revealing [place].   Lab results today (02/05/17) of CBC, CMP, and LDH is as follows: all values are WNL except for WBC at 22.7, Hgb at 10.5, HCT at 33.8, MCHC at 31.1, RDW at 15.2, Neutro Abs at 17.5. LDH at [place].   On review of systems, pt reports all of the above and also a decreased appetite.   REVIEW OF SYSTEMS:    A 10+ POINT REVIEW OF SYSTEMS WAS OBTAINED including neurology, dermatology, psychiatry, cardiac, respiratory, lymph, extremities, GI, GU, Musculoskeletal, constitutional, breasts, reproductive, HEENT.  All pertinent positives are noted in the HPI.  All others are negative.  . Past Medical History:  Diagnosis Date  . Abdominal pain, RUQ   . Abnormal EKG 08/07/2016  . Acute midline thoracic back pain   . Acute respiratory distress 09/25/2016  . Anemia 08/07/2016  . Chronic low back pain 09/25/2016  . Cough   . DLBCL (diffuse large B cell lymphoma) ([place]) 08/17/2016  . GERD (gastroesophageal reflux disease)   . History of chemotherapy   . Hypokalemia 09/25/2016  . 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 Lymphadenopathy   . Morbid obesity with BMI of 50.0-59.9, adult ([place])   . Neutrophilic leukocytosis 23/05/5730  . NHL (non-Hodgkin's lymphoma) ([place])   . Pleural effusion on right 08/07/2016  . Recurrent pleural effusion on right 10/08/2016  . Recurrent right pleural effusion 09/25/2016  . S/P thoracentesis   . Shortness of breath 08/07/2016  .  SOB (shortness of breath) 09/26/2016    Past Surgical History:  Procedure Laterality Date  . ABDOMINAL HYSTERECTOMY    . IR CV LINE INJECTION  10/26/2016  . IR FLUORO GUIDE PORT INSERTION RIGHT  08/18/2016  . IR FLUORO GUIDE PORT INSERTION RIGHT  11/03/2016  . IR REMOVAL TUN CV CATH W/O FL  11/03/2016  . IR THORACENTESIS ASP PLEURAL SPACE W/IMG GUIDE  08/07/2016  . IR THORACENTESIS ASP PLEURAL SPACE W/IMG GUIDE  08/14/2016  . IR US GUIDE VASC ACCESS RIGHT  08/18/2016   . IR US GUIDE VASC ACCESS RIGHT  11/03/2016    . Social History   Tobacco Use  . Smoking status: Former Smoker    Packs/day: 0.50    Years: 47.00    Pack years: 23.50    Types: Cigarettes    Last attempt to quit: 07/28/2016    Years since quitting: 0.5  . Smokeless tobacco: Never Used  Substance Use Topics  . Alcohol use: No  . Drug use: No    ALLERGIES:  has No Known Allergies.  MEDICATIONS:  Current Outpatient Medications  Medication Sig Dispense Refill  . acetaminophen (TYLENOL) 500 MG tablet Take 1,000 mg by mouth every 6 (six) hours as needed for moderate pain.    [name] aspirin EC 81 MG tablet Take 1 tablet (81 mg total) by mouth daily. 90 tablet 3  . furosemide (LASIX) 20 MG tablet Take 1 tablet (20 mg total) by mouth daily. 30 tablet 1  . Multiple Vitamin (MULTIVITAMIN WITH MINERALS) TABS tablet Take 1 tablet by mouth daily.     No current facility-administered medications for this visit.     PHYSICAL EXAMINATION: ECOG PERFORMANCE STATUS: 1 - Symptomatic but completely ambulatory  . Vitals:   02/05/17 1536  BP: 100/86  Pulse: 94  Resp: (!) 24  Temp: 99.3 F (37.4 C)  SpO2: 98%    Filed Weights   02/05/17 1536  Weight: 254 lb 4.8 oz (115.3 kg)   .Body mass index is 47.27 kg/m.  GENERAL:alert, in no acute distress and comfortable SKIN: no acute rashes, no significant lesions EYES: conjunctiva are pink and non-injected, sclera anicteric OROPHARYNX: MMM, no exudates, no oropharyngeal erythema or ulceration NECK: supple, no JVD LYMPH:  no palpable lymphadenopathy in the cervical, axillary or inguinal regions LUNGS: distant breath sounds. Decreased AE rt base about 1/4 up posterior chest wall. HEART: S1/S2 irregular rate & rhythm ABDOMEN:  normoactive bowel sounds , non tender, not distended. Extremity: grade 1 pedal edema PSYCH: alert & oriented x 3 with fluent speech NEURO: no focal motor/sensory deficits   LABORATORY DATA:   I have reviewed the  data as listed  . CBC Latest Ref Rng & Units 01/23/2017 01/11/2017 12/07/2016  WBC 4.0 - 10.5 K/uL 6.7 3.6(L) 3.8(L)  Hemoglobin 12.0 - 15.0 g/dL 9.2(L) - 8.8(L)  Hematocrit 36.0 - 46.0 % 29.5(L) 30.1(L) 27.9(L)  Platelets 150 - 400 K/uL 198 322 213    . CMP Latest Ref Rng & Units 01/11/2017 12/07/2016 11/16/2016  Glucose 70 - 140 mg/dL 93 94 102  BUN 7 - 26 mg/dL 12 9.3 10.7  Creatinine 0.6 - 1.1 mg/dL - 0.7 0.8  Sodium 136 - 145 mmol/L 142 143 142  Potassium 3.3 - 4.7 mmol/L 3.5 3.5 3.4(L)  Chloride 98 - 109 mmol/L 108 - -  CO2 22 - 29 mmol/L 25 24 26   Calcium 8.4 - 10.4 mg/dL 9.3 8.9 9.1  Total Protein 6.4 - 8.3 g/dL 6.6 6.0(L)  6.4  Total Bilirubin 0.2 - 1.2 mg/dL 0.3 0.34 0.29  Alkaline Phos 40 - 150 U/L 62 42 49  AST 5 - 34 U/L 15 18 16   ALT 0 - 55 U/L 12 15 11    LDH 1900's   RADIOGRAPHIC STUDIES: I have personally reviewed the radiological images as listed and agreed with the findings in the report. Ct Biopsy  Result Date: 01/23/2017 INDICATION: History of diffuse large B-cell lymphoma now with concern for recurrence. Please perform CT-guided biopsy of hypermetabolic right external iliac chain lymph node for tissue diagnostic purposes. EXAM: CT-GUIDED BIOPSY OF RIGHT EXTERNAL ILIAC CHAIN LYMPH NODE COMPARISON:  PET-CT - 01/05/2017 MEDICATIONS: None. ANESTHESIA/SEDATION: Fentanyl 100 mcg IV; Versed 2 mg IV Sedation time: 16 minutes; The patient was continuously monitored during the procedure by the interventional radiology nurse under my direct supervision. CONTRAST:  None. COMPLICATIONS: None immediate. PROCEDURE: Informed consent was obtained from the patient following an explanation of the procedure, risks, benefits and alternatives. A time out was performed prior to the initiation of the procedure. The patient was positioned supine on the CT table and a limited CT was performed for procedural planning demonstrating unchanged size and appearance of known hypermetabolic right  external iliac chain nodal conglomeration with dominant component measuring approximately 3.9 x 1.9 cm (image 18, series 3. The procedure was planned. The operative site was prepped and draped in the usual sterile fashion. Appropriate trajectory was confirmed with a 22 gauge spinal needle after the adjacent tissues were anesthetized with 1% Lidocaine with epinephrine. Under intermittent CT guidance, a 17 gauge coaxial needle was advanced into the peripheral aspect of the nodal conglomeration. Appropriate positioning was confirmed and 5 core needle biopsy samples were obtained with an 18 gauge core needle biopsy device. The co-axial needle was removed and hemostasis was achieved with manual compression. A limited postprocedural CT was negative for hemorrhage or additional complication. A dressing was placed. The patient tolerated the procedure well without immediate postprocedural complication. IMPRESSION: Technically successful CT guided core needle biopsy of hypermetabolic right external iliac chain nodal conglomeration. Electronically Signed   By: Sandi Mariscal M.D.   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  -PET scan skull base to thigh on 10/21/2016 with results revealing: IMPRESSION:1. There has been significant interval improvement in the chest, abdomen, and pelvis in the interval. The dominant mass in the chest is much smaller and less FDG avid in the interval. However, the remaining maximum uptake is slightly greater than mediastinal blood pool. The dominant remaining disease in the pelvis is an external iliac lymph node with a maximum SUV moderately greater than liver blood pool. Evaluation of the bones is more  difficult today given the diffuse uptake likely associated with recent chemotherapy. I suspect the previously identified humeral lesion remains. Recommend continued attention to the bones on follow-up. [place] Category 4  -PET scan skull base to thigh on 01/05/2017 r after 6 cycles of R-CHOP and IT MTX revealing: IMPRESSION:1. Mixed findings on the PET-CT. There are definite areas of significant continued improvement but also some new areas of involvement and slightly progressive pelvic adenopathy as detailed Above. 2. Near complete resolution of the right pleural effusion. 3. Much improved osseous disease.  Plan - We had a lengthy discuss of her PET/CT scan results and her rpt LN biopsy results showing relsed/refractory DLBCL. -significantly elevated LDH concerning for progressive lymphomas -FDG avid over lower back likely from LP's. -will admitted patient for MRI brain to r/o CNS lymphoma improvement. -CT chest/abd/pelvis to re-evaluate status of lymphoma. -evaluate leucocytosis - septic w/u as inpatient.  #2 Recurrent right pleural effusion likely related to lymphoma possibly from decreased lymphatic drainage. Additional element of pleural effusion from ? Diastolic dysfunction +steroids with fluid retention+ atelectasis from body habitus  -Had thoracentesis during her in October with resolution of this and of her symptoms.   Plan -no ndication for thoracentesis at this time.  #4 Back pain with large paraspinal mass in the thoracic region with possible epidural extension. No overt neurological symptoms related to this.  No significant back pain related to this curently - this has resolved with response of her lymphoma to treatment.  #5 Paroxysmal atrial fibrillation - last EKG in 10/2016 - NSR Plan -f/u  with PCP/Cardiology   Patient Active Problem List   Diagnosis Date Noted  . Positive blood culture 02/07/2017  . Numbness 02/05/2017  . PAF (paroxysmal atrial fibrillation) ([place])  01/08/2017  . LVH (left ventricular hypertrophy) 01/08/2017  . S/P thoracentesis   . Cough   . Neutrophilic leukocytosis 02/58/5277  . Recurrent pleural effusion on right 10/08/2016  . SOB (shortness of breath) 09/26/2016  . Acute respiratory distress 09/25/2016  . Chronic low back pain 09/25/2016  . Hypokalemia 09/25/2016  . Recurrent right pleural effusion 09/25/2016  . DLBCL (diffuse large B cell lymphoma) ([place]) 08/17/2016  . Acute midline thoracic back pain   . Lymphadenopathy   . Morbid obesity with BMI of 50.0-59.9, adult ([place])   . NHL (non-Hodgkin's lymphoma) ([place])   . Abdominal pain, RUQ   . Pleural effusion on right 08/07/2016  . Abnormal EKG 08/07/2016  . 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 Anemia 08/07/2016  . GERD (gastroesophageal reflux disease) 08/07/2016  . Shortness of breath 08/07/2016   -continue f/u with PCP for other medical cares and for continue mx of Paroxysmal afib.  -would recommend sleep study to evaluate for sleep apnea. - discuss information at tumor board.   #5. Elevated WBC count -Her high WBC count of 22.7 does not present with high lymph abs (2.9k) and may indicate an inflammatory response or infection, could be an abscess at the top of her buttocks that she notes is tender.  #6 Septic work up to r/o abscess infection #7 CT Chest/Abdomen pelvis to r/o fluid above buttocks  #8 Numbness under the chin -R/o CNS involvement with lymphoma via MRI #9 Relapsed/refractory diffuse large b cell lymphoma CT Chest/Abdomen pelvis to rule out progression. Will decide on second line treatment plan   -Order blood test with a sepsis workup -MRI of the brain to understand her symptoms of decreased sensation.    ED for further evaluation at this time Will setup clinic f/u based on findings.   I spent 30 minutes counseling the patient face to face. The total time spent in the appointment was 40 minutes and more than 50% was on counseling and direct patient  cares.    Sullivan Lone MD [place] AAHIVMS [facility] Hematology/Oncology Physician [facility]  (Office):       [phone] (Work cell):  [phone] (Fax):           [phone]  This document serves as a record of services personally performed by  Sullivan Lone, MD. It was created on his behalf by Baldwin [place], a trained medical scribe. The creation of this record is based on the scribe's personal observations and the provider's statements to them.   .I have reviewed the above documentation for accuracy and completeness, and I agree with the above. Brunetta Genera MD MS

## 2017-02-05 NOTE — Telephone Encounter (Signed)
Based on pt assessment and lab results. Dr. Irene Limbo requested for pt to be sent to ED. Spoke with Marzetta Board, Agricultural consultant. No rooms at the time for pt to be sent to [facility] ED, recommended direct admit if MD agreeable. Requested hospitalist, and Dr. Bonner Puna received report from Dr. Irene Limbo. Bed placement received for room 1323. Report given to [place], Therapist, sports.   Noted pt diagnosis Diffuse large B cell lymphoma per MD. Requested testing based on symptoms and assessment: brain MRI to determine c/o chin numbness, septic workup to sacral area and based on elevated WBC (potential collection above intergluteal cleft), CT C/A/P to determine lymphoma progression.   Pt A&O x 4, wide gait to offset pain in L groin and leg. SOB at rest (pt baseline). Shared elevated WBC and LDH levels, as well as, most recent VS during clinic visit. Pt taken to 3W Rm 1323 and left with Danielle, NT to take VS and get pt settled.

## 2017-02-05 NOTE — H&P (Signed)
History and Physical    Jo Collins ZOX:[id number] DOB: 10-21-1950 DOA: 02/05/2017  PCP: Deland Pretty, MD   Patient coming from: Home  Chief Complaint: Numbness, left groin and leg pain  HPI: Jo Collins is a 67 y.o. female with medical history significant for diffuse large B-cell lymphoma, recurrent right pleural effusion, PAF, intrathoracic mass obesity.  Patient saw her oncologist in clinic today for these complaints and was directly admitted to the hospitalist service, has no complaints and blood work. Patient reports onset of numbness on her chin just below her lower lip over the past few days, initially in the center of her chin and spread laterally , now complains of numbness involving only her dorsal surface of her left foot, around her second metatarsal.  Patient denies weakness of her extremities, facial asymmetry, slurred speech or vision changes.  Patient had CT guided biopsy of her right external iliac chain lymph node 01/23/17, for concerns of B-cell lymphoma recurrence.  She has not had any problems with the biopsy site both she subsequently developed pain in her left groin, without swelling, which shoots down towards her left leg.  No fever, no chills, no ulcers or wounds, no shortness of breath at this time, no cough, no chest pain, no dysuria, vomiting, diarrhea, no abdominal pain.   Patient reports she might have had some tenderness in her left upper buttock/sacral area 3 days ago but that has resolved no swelling or tenderness in the area since then.  Patient's WBC when she saw her oncologist was elevated at 22.7.  Recommended admission for workup to include brain MRI to rule out brain metastases, CT chest/abdomen/pelvis to evaluate lymphoma progression, possible sacral/gluteal cleft collection.  Review of Systems: As per HPI otherwise 10 point review of systems negative.   Past Medical History:  Diagnosis Date  . Abdominal pain, RUQ   . Abnormal EKG 08/07/2016  . Acute  midline thoracic back pain   . Acute respiratory distress 09/25/2016  . Anemia 08/07/2016  . Chronic low back pain 09/25/2016  . Cough   . DLBCL (diffuse large B cell lymphoma) ([place]) 08/17/2016  . GERD (gastroesophageal reflux disease)   . History of chemotherapy   . Hypokalemia 09/25/2016  . 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 Lymphadenopathy   . Morbid obesity with BMI of 50.0-59.9, adult ([place])   . Neutrophilic leukocytosis 81/01/9145  . NHL (non-Hodgkin's lymphoma) ([place])   . Pleural effusion on right 08/07/2016  . Recurrent pleural effusion on right 10/08/2016  . Recurrent right pleural effusion 09/25/2016  . S/P thoracentesis   . Shortness of breath 08/07/2016  . SOB (shortness of breath) 09/26/2016    Past Surgical History:  Procedure Laterality Date  . ABDOMINAL HYSTERECTOMY    . IR CV LINE INJECTION  10/26/2016  . IR FLUORO GUIDE PORT INSERTION RIGHT  08/18/2016  . IR FLUORO GUIDE PORT INSERTION RIGHT  11/03/2016  . IR REMOVAL TUN CV CATH W/O FL  11/03/2016  . IR THORACENTESIS ASP PLEURAL SPACE W/IMG GUIDE  08/07/2016  . IR THORACENTESIS ASP PLEURAL SPACE W/IMG GUIDE  08/14/2016  . IR US GUIDE VASC ACCESS RIGHT  08/18/2016  . IR US GUIDE VASC ACCESS RIGHT  11/03/2016    reports that she quit smoking about 6 months ago. Her smoking use included cigarettes. She has a 23.50 pack-year smoking history. she has never used smokeless tobacco. She reports that she does not drink alcohol or use drugs.  No Known Allergies  Family History  Problem Relation  Age of Onset  . COPD Mother   . Diabetes Mellitus II Sister   . Diabetes Mellitus II Brother   . Diabetes Mellitus II Sister   . Stroke Sister   . Hypertension Sister   . Diabetes Mellitus II Sister    Prior to Admission medications   Medication Sig Start Date End Date Taking? Authorizing Provider  acetaminophen (TYLENOL) 500 MG tablet Take 1,000 mg by mouth every 6 (six) hours as needed for moderate pain.   Yes [provider]  aspirin  EC 81 MG tablet Take 1 tablet (81 mg total) by mouth daily. 01/08/17  Yes Fay Records, MD  furosemide (LASIX) 20 MG tablet Take 1 tablet (20 mg total) by mouth daily. 10/11/16  Yes Eugenie Filler, MD  ibuprofen (ADVIL,MOTRIN) 200 MG tablet Take 800 mg by mouth 2 (two) times daily as needed for headache.   Yes [provider]  Multiple Vitamin (MULTIVITAMIN WITH MINERALS) TABS tablet Take 1 tablet by mouth daily.   Yes [provider]    Physical Exam: Vitals:   02/05/17 1830 02/05/17 2113  BP: 110/70 115/66  Pulse: (!) 111 89  Resp: 18 20  Temp: 98.1 F (36.7 C) 99.2 F (37.3 C)  TempSrc: Oral Oral  SpO2: 94% 98%    Constitutional: NAD, calm, comfortable, obese Vitals:   02/05/17 1830 02/05/17 2113  BP: 110/70 115/66  Pulse: (!) 111 89  Resp: 18 20  Temp: 98.1 F (36.7 C) 99.2 F (37.3 C)  TempSrc: Oral Oral  SpO2: 94% 98%   Eyes: PERRL, lids and conjunctivae normal ENMT: Mucous membranes are moist. Posterior pharynx clear of any exudate or lesions.Normal dentition.  Neck: normal, supple, no masses, no thyromegaly, port right cervical region, non tender, no erythema.  Respiratory: clear to auscultation bilaterally, no wheezing, no crackles. Normal respiratory effort. No accessory muscle use.  Cardiovascular: Regular rate and rhythm, no murmurs / rubs / gallops. No extremity edema. 2+ pedal pulses.  Abdomen: no tenderness, no masses palpated. No hepatosplenomegaly. Bowel sounds positive. Tenderness in left gluteal region, no masses palpated, no erythema. Musculoskeletal: no clubbing / cyanosis. No joint deformity upper and lower extremities. Good ROM, no contractures. Normal muscle tone. Tenderness and firm mild swelling palpable lateral side of left lower extremity, not present on the right lower extremity.  No tenderness sacral area Skin: no rashes, lesions, ulcers. Neurologic: CN 2-12 grossly intact. Sensation intact, Strength 5/5 in all 4.    Psychiatric: Normal judgment and insight. Alert and oriented x 3. Normal mood.   Labs on Admission: I have personally reviewed following labs and imaging studies  CBC: Recent Labs  Lab 02/05/17 1502  WBC 22.7*  NEUTROABS 17.5*  HCT 33.8*  MCV 86.9  PLT 956   Basic Metabolic Panel: Recent Labs  Lab 02/05/17 1502  NA 140  K 3.5  CL 105  CO2 22  GLUCOSE 94  BUN 12  CALCIUM 9.4   Liver Function Tests: Recent Labs  Lab 02/05/17 1502  AST 37*  ALT 14  ALKPHOS 63  BILITOT 0.5  PROT 6.8  ALBUMIN 3.1*   Urine analysis:    Component Value Date/Time   COLORURINE YELLOW 10/08/2016 1359   APPEARANCEUR HAZY (A) 10/08/2016 1359   LABSPEC 1.029 10/08/2016 1359   PHURINE 5.0 10/08/2016 1359   GLUCOSEU NEGATIVE 10/08/2016 1359   HGBUR NEGATIVE 10/08/2016 1359   BILIRUBINUR NEGATIVE 10/08/2016 1359   KETONESUR NEGATIVE 10/08/2016 1359   PROTEINUR 30 (A)  10/08/2016 1359   NITRITE NEGATIVE 10/08/2016 1359   LEUKOCYTESUR NEGATIVE 10/08/2016 1359    Radiological Exams on Admission: No results found.  EKG: None.  Assessment/Plan Principal Problem:   Numbness Active Problems:   Intrathoracic mass   Morbid obesity with BMI of 50.0-59.9, adult ([facility])   DLBCL (diffuse large B cell lymphoma) (H[facility])   Recurrent right pleural effusion   PAF (paroxysmal atrial fibrillation) (H[facility])  Numbness- chin and dorsal surface left lower extremity around second metatarsal.  Not in any particular distribution. ? Chemo Side effect causing paraesthesias? -Brain MRI with contrast - Follow onc recs  Leukocytosis- 22.7. Temp- 99.2. Left groin tenderness only.  No respiratory, GI or GU symptoms.  She had is immunocompromised.  Will workup for sepsis.  -CT abdomen pelvis with contrast, CT chest following oncology recommendations to evaluate for disease progression. -Blood cultures -Urinalysis, urine cultures  Left leg pain-mild firmness palpated lateral side of left leg.  No swelling or  erythema.  -Venous Dopplers to rule out DVT -Left lower extremity x-ray --Morphine 2 mg X1, hydrocodone-acet 5/325 Q6H PRN  Diffuse Large B cell lymphoma- , completed sixth cycle of R-CHOP recent right external iliac lymph node biopsy 01/23/17-monoclonal B-cell population identified.  CT chest. -Follow oncology recommendations  A fib-follow with cardiologist Dr. Irish Lack, last visit 01/08/17. Not on anticoag. Appears to be in sinus.  - Follow up as outpt  DVT prophylaxis: Lovenox Code Status: Full Family Communication:  Spouse at bedside Disposition Plan: Home, 2- 3 days Consults called: Oncology should see patient, as they directly admitted to our service Admission status: inpt, tele   Bethena Roys MD [facility] Pager [phone]  If 6PM-4AM, please contact night-coverage [email or web address] Password [email or web address]  02/05/2017, 10:13 PM

## 2017-02-05 NOTE — Telephone Encounter (Signed)
Pt called this AM with c/o increased chin numbness and leg numbness. Pt requested call back to further discuss symptoms. Spoke with pt over the phone at 1110 and pt reported, "increased chin numbness that was only in the center of my chin at the last appointment with Dr. Irene Limbo. Now it has spread all the way across my chin. I am also having numbness in my left groin that started after my biopsy and numbness in my left foot that has started to affect my walking." Relayed message to Dr. Irene Limbo who recommended the pt come in today or go to the ED with worsening chin numbness as the pt's condition seems to be worsening. Pt in agreement to come in at 1500 for lab work and 1520 to see Dr. Irene Limbo. Labs ordered and appt scheduled.

## 2017-02-06 ENCOUNTER — Inpatient Hospital Stay (HOSPITAL_COMMUNITY): Payer: Medicare Other

## 2017-02-06 ENCOUNTER — Encounter (HOSPITAL_COMMUNITY): Payer: Self-pay

## 2017-02-06 ENCOUNTER — Other Ambulatory Visit: Payer: Self-pay

## 2017-02-06 DIAGNOSIS — M79609 Pain in unspecified limb: Secondary | ICD-10-CM

## 2017-02-06 LAB — CBC
HEMATOCRIT: 29.7 % — AB (ref 36.0–46.0)
HEMOGLOBIN: 9.3 g/dL — AB (ref 12.0–15.0)
MCH: 27.2 pg (ref 26.0–34.0)
MCHC: 31.3 g/dL (ref 30.0–36.0)
MCV: 86.8 fL (ref 78.0–100.0)
Platelets: 185 10*3/uL (ref 150–400)
RBC: 3.42 MIL/uL — ABNORMAL LOW (ref 3.87–5.11)
RDW: 15.3 % (ref 11.5–15.5)
WBC: 21.4 10*3/uL — AB (ref 4.0–10.5)

## 2017-02-06 LAB — BASIC METABOLIC PANEL
ANION GAP: 10 (ref 5–15)
BUN: 12 mg/dL (ref 6–20)
CALCIUM: 8.7 mg/dL — AB (ref 8.9–10.3)
CO2: 24 mmol/L (ref 22–32)
Chloride: 104 mmol/L (ref 101–111)
Creatinine, Ser: 0.87 mg/dL (ref 0.44–1.00)
GFR calc Af Amer: >60 mL/min (ref >60)
GLUCOSE: 100 mg/dL — AB (ref 65–99)
POTASSIUM: 3 mmol/L — AB (ref 3.5–5.1)
SODIUM: 138 mmol/L (ref 135–145)

## 2017-02-06 MED ORDER — LORAZEPAM 2 MG/ML IJ SOLN
INTRAMUSCULAR | Status: AC
  Filled 2017-02-06: qty 1

## 2017-02-06 MED ORDER — IOPAMIDOL (ISOVUE-300) INJECTION 61%
100.0000 mL | Freq: Once | INTRAVENOUS | Status: AC | PRN
  Administered 2017-02-06: 100 mL via INTRAVENOUS

## 2017-02-06 MED ORDER — POTASSIUM CHLORIDE CRYS ER 20 MEQ PO TBCR
40.0000 meq | EXTENDED_RELEASE_TABLET | Freq: Once | ORAL | Status: AC
  Administered 2017-02-06: 40 meq via ORAL
  Filled 2017-02-06: qty 2

## 2017-02-06 MED ORDER — IOPAMIDOL (ISOVUE-300) INJECTION 61%
INTRAVENOUS | Status: AC
  Administered 2017-02-06: 100 mL via INTRAVENOUS
  Filled 2017-02-06: qty 100

## 2017-02-06 MED ORDER — IOPAMIDOL (ISOVUE-300) INJECTION 61%: 15.0000 mL | Freq: Once | INTRAVENOUS | Status: AC | PRN

## 2017-02-06 MED ORDER — LORAZEPAM 2 MG/ML IJ SOLN
1.0000 mg | Freq: Once | INTRAMUSCULAR | Status: AC
  Administered 2017-02-06: 1 mg via INTRAVENOUS

## 2017-02-06 MED ORDER — IOPAMIDOL (ISOVUE-300) INJECTION 61%
INTRAVENOUS | Status: AC
  Administered 2017-02-06: 30 mL
  Filled 2017-02-06: qty 30

## 2017-02-06 NOTE — Progress Notes (Signed)
Left lower extremity venous duplex has been completed. Negative for DVT.  02/06/17 9:21 AM Jo Collins RVT

## 2017-02-06 NOTE — Progress Notes (Signed)
Patient ID: Jo Collins, female   DOB: Mar 02, 1950, 67 y.o.   MRN: [id number]  PROGRESS NOTE    Jo Collins  KZS:[id number] DOB: 04-Dec-1950 DOA: 02/05/2017  PCP: Deland Pretty, MD   Brief Narrative:  67 y.o. female with medical history significant for diffuse large B-cell lymphoma, recurrent right pleural effusion, PAF, intrathoracic mass, obesity. Patient saw her oncologist in clinic and was directly admitted to the hospitalist service. Patient reports onset of numbness on her chin just below her lower lip over the past few days, initially in the center of her chin and spread laterally, complains of numbness involving only her dorsal surface of her left foot, around her second metatarsal. Patient denies weakness of her extremities, facial asymmetry, slurred speech or vision changes.  Patient had CT guided biopsy of her right external iliac chain lymph node 01/23/17, for concerns of B-cell lymphoma recurrence.  Patient's WBC when she saw her oncologist was elevated at 22.7.  Recommended admission for workup to include brain MRI to rule out brain metastases, CT chest/abdomen/pelvis to evaluate lymphoma progression, possible sacral/gluteal cleft collection.   Assessment & Plan:   Principal Problem:   Numbness - In the left leg, groin area and chin - MRI brain pending to rule out brain mets  - Continue aspirin daily   Active Problems:   Intrathoracic mass - CT chest showed a new chest wall mass involving the musculature of the right, anterior inferior chest wall, spanning from approximately the right sixth through the right eighth rib cartilages. There is a new adjacent subcutaneous soft tissue mass measuring 14 mm. Findings are consistent with new areas of soft tissue lymphoma.. In the pelvis, there is enlargement of a left obturator lymph node when compared to the recent PET-CT, consistent with progression lymphoma.  - Sent message to pt onc Dr.Kale that pt is admitted     Morbid obesity  with BMI of 50.0-59.9, adult ([place]) - Counseled on nutrition, diet    Hypokalemia - Supplemented     Anemia of chronic disease - Due to lymphoma - Monitor daily hgb     DLBCL (diffuse large B cell lymphoma) ([place]) - Management per oncology    PAF (paroxysmal atrial fibrillation) ([facility]) - CHA2DS2-VASc Score is  - Continue aspirin - HR controlled at this time    DVT prophylaxis: Lovenox subQ Code Status: full code  Family Communication: no family at bedside  Disposition Plan: awaiting MRI results    Consultants:   None  Procedures:  None   Antimicrobials:   None    Subjective: No overnight events.  Objective: Vitals:   02/05/17 1830 02/05/17 2113 02/06/17 0436 02/06/17 1408  BP: 110/70 115/66 108/85 117/75  Pulse: (!) 111 89 (!) 53 83  Resp: 18 20 20 18   Temp: 73.2 F (36.7 C) 99.2 F (37.3 C) 98.8 F (37.1 C) 98.8 F (37.1 C)  TempSrc: Oral Oral Oral Oral  SpO2: 94% 98% 97% 95%    Intake/Output Summary (Last 24 hours) at 02/06/2017 2115 Last data filed at 02/06/2017 0848 Gross per 24 hour  Intake 240 ml  Output -  Net 240 ml   There were no vitals filed for this visit.  Examination:  General exam: Appears calm and comfortable  Respiratory system: diminished breath sounds but no wheezing  Cardiovascular system: S1 & S2 heard, Rate controlled  Gastrointestinal system: Abdomen is nondistended, soft and nontender. No organomegaly or masses felt. Normal bowel sounds heard. Central nervous system: Alert and oriented. No focal  neurological deficits. Extremities: +1 LE edema, no redness  Skin: No rashes, lesions or ulcers Psychiatry: Judgement and insight appear normal. Mood & affect appropriate.   Data Reviewed: I have personally reviewed following labs and imaging studies  CBC: Recent Labs  Lab 02/05/17 1502 02/06/17 0359  WBC 22.7* 21.4*  NEUTROABS 17.5*  --   HGB  --  9.3*  HCT 33.8* 29.7*  MCV 86.9 86.8  PLT 204 882   Basic Metabolic  Panel: Recent Labs  Lab 02/05/17 1502 02/06/17 0359  NA 140 138  K 3.5 3.0*  CL 105 104  CO2 22 24  GLUCOSE 94 100*  BUN 12 12  CREATININE 1.02 0.87  CALCIUM 9.4 8.7*   GFR: Estimated Creatinine Clearance: 76.9 mL/min (by C-G formula based on SCr of 0.87 mg/dL). Liver Function Tests: Recent Labs  Lab 02/05/17 1502  AST 37*  ALT 14  ALKPHOS 63  BILITOT 0.5  PROT 6.8  ALBUMIN 3.1*   No results for input(s): LIPASE, AMYLASE in the last 168 hours. No results for input(s): AMMONIA in the last 168 hours. Coagulation Profile: No results for input(s): INR, PROTIME in the last 168 hours. Cardiac Enzymes: No results for input(s): CKTOTAL, CKMB, CKMBINDEX, TROPONINI in the last 168 hours. BNP (last 3 results) No results for input(s): PROBNP in the last 8760 hours. HbA1C: No results for input(s): HGBA1C in the last 72 hours. CBG: No results for input(s): GLUCAP in the last 168 hours. Lipid Profile: No results for input(s): CHOL, HDL, LDLCALC, TRIG, CHOLHDL, LDLDIRECT in the last 72 hours. Thyroid Function Tests: No results for input(s): TSH, T4TOTAL, FREET4, T3FREE, THYROIDAB in the last 72 hours. Anemia Panel: No results for input(s): VITAMINB12, FOLATE, FERRITIN, TIBC, IRON, RETICCTPCT in the last 72 hours. Urine analysis:    Component Value Date/Time   COLORURINE YELLOW 10/08/2016 1359   APPEARANCEUR HAZY (A) 10/08/2016 1359   LABSPEC 1.029 10/08/2016 1359   PHURINE 5.0 10/08/2016 1359   GLUCOSEU NEGATIVE 10/08/2016 1359   HGBUR NEGATIVE 10/08/2016 1359   BILIRUBINUR NEGATIVE 10/08/2016 1359   KETONESUR NEGATIVE 10/08/2016 1359   PROTEINUR 30 (A) 10/08/2016 1359   NITRITE NEGATIVE 10/08/2016 1359   LEUKOCYTESUR NEGATIVE 10/08/2016 1359   Sepsis Labs: @LABRCNTIP (procalcitonin:4,lacticidven:4)   )No results found for this or any previous visit (from the past 240 hour(s)).     Scheduled Meds: . aspirin EC  81 mg Oral Daily  . enoxaparin (LOVENOX) injection   60 mg Subcutaneous Q24H  .  morphine injection  2 mg Intravenous Once   Continuous Infusions:   LOS: 1 day    Time spent: 25 minutes  Greater than 50% of the time spent on counseling and coordinating the care.   Leisa Lenz, MD [facility] Pager [phone]  If 7PM-7AM, please contact night-coverage [email or web address] Password [email or web address] 02/06/2017, 9:15 PM

## 2017-02-07 ENCOUNTER — Encounter (HOSPITAL_COMMUNITY): Payer: Self-pay | Admitting: Internal Medicine

## 2017-02-07 ENCOUNTER — Telehealth: Payer: Self-pay | Admitting: Hematology

## 2017-02-07 DIAGNOSIS — R222 Localized swelling, mass and lump, trunk: Secondary | ICD-10-CM

## 2017-02-07 DIAGNOSIS — I480 Paroxysmal atrial fibrillation: Secondary | ICD-10-CM

## 2017-02-07 DIAGNOSIS — Z6841 Body Mass Index (BMI) 40.0 and over, adult: Secondary | ICD-10-CM

## 2017-02-07 DIAGNOSIS — D72829 Elevated white blood cell count, unspecified: Secondary | ICD-10-CM

## 2017-02-07 DIAGNOSIS — Z9221 Personal history of antineoplastic chemotherapy: Secondary | ICD-10-CM

## 2017-02-07 DIAGNOSIS — D696 Thrombocytopenia, unspecified: Secondary | ICD-10-CM

## 2017-02-07 DIAGNOSIS — Z79899 Other long term (current) drug therapy: Secondary | ICD-10-CM

## 2017-02-07 DIAGNOSIS — Z87891 Personal history of nicotine dependence: Secondary | ICD-10-CM

## 2017-02-07 DIAGNOSIS — C8338 Diffuse large B-cell lymphoma, lymph nodes of multiple sites: Secondary | ICD-10-CM

## 2017-02-07 DIAGNOSIS — K219 Gastro-esophageal reflux disease without esophagitis: Secondary | ICD-10-CM

## 2017-02-07 DIAGNOSIS — R591 Generalized enlarged lymph nodes: Secondary | ICD-10-CM

## 2017-02-07 DIAGNOSIS — R7881 Bacteremia: Secondary | ICD-10-CM

## 2017-02-07 DIAGNOSIS — E876 Hypokalemia: Secondary | ICD-10-CM

## 2017-02-07 DIAGNOSIS — M549 Dorsalgia, unspecified: Secondary | ICD-10-CM

## 2017-02-07 LAB — BLOOD CULTURE ID PANEL (REFLEXED)
Acinetobacter baumannii: NOT DETECTED
CANDIDA GLABRATA: NOT DETECTED
CANDIDA KRUSEI: NOT DETECTED
CANDIDA TROPICALIS: NOT DETECTED
Candida albicans: NOT DETECTED
Candida parapsilosis: NOT DETECTED
Carbapenem resistance: NOT DETECTED
ENTEROCOCCUS SPECIES: NOT DETECTED
ESCHERICHIA COLI: NOT DETECTED
Enterobacter cloacae complex: NOT DETECTED
Enterobacteriaceae species: NOT DETECTED
Haemophilus influenzae: NOT DETECTED
Klebsiella oxytoca: NOT DETECTED
Klebsiella pneumoniae: NOT DETECTED
Listeria monocytogenes: NOT DETECTED
Methicillin resistance: NOT DETECTED
Neisseria meningitidis: NOT DETECTED
PROTEUS SPECIES: NOT DETECTED
Pseudomonas aeruginosa: NOT DETECTED
SERRATIA MARCESCENS: NOT DETECTED
STAPHYLOCOCCUS SPECIES: DETECTED — AB
Staphylococcus aureus (BCID): NOT DETECTED
Streptococcus agalactiae: NOT DETECTED
Streptococcus pneumoniae: NOT DETECTED
Streptococcus pyogenes: NOT DETECTED
Streptococcus species: NOT DETECTED

## 2017-02-07 LAB — CBC
HEMATOCRIT: 26.4 % — AB (ref 36.0–46.0)
Hemoglobin: 8.3 g/dL — ABNORMAL LOW (ref 12.0–15.0)
MCH: 26.9 pg (ref 26.0–34.0)
MCHC: 31.4 g/dL (ref 30.0–36.0)
MCV: 85.7 fL (ref 78.0–100.0)
PLATELETS: 153 10*3/uL (ref 150–400)
RBC: 3.08 MIL/uL — ABNORMAL LOW (ref 3.87–5.11)
RDW: 15.8 % — AB (ref 11.5–15.5)
WBC: 22.9 10*3/uL — ABNORMAL HIGH (ref 4.0–10.5)

## 2017-02-07 LAB — BASIC METABOLIC PANEL
Anion gap: 8 (ref 5–15)
BUN: 13 mg/dL (ref 6–20)
CALCIUM: 8.6 mg/dL — AB (ref 8.9–10.3)
CO2: 24 mmol/L (ref 22–32)
CREATININE: 0.93 mg/dL (ref 0.44–1.00)
Chloride: 103 mmol/L (ref 101–111)
GFR calc non Af Amer: >60 mL/min (ref >60)
GLUCOSE: 106 mg/dL — AB (ref 65–99)
Potassium: 3.5 mmol/L (ref 3.5–5.1)
Sodium: 135 mmol/L (ref 135–145)

## 2017-02-07 LAB — DIRECT ANTIGLOBULIN TEST (NOT AT ARMC)
DAT, COMPLEMENT: NEGATIVE
DAT, IGG: NEGATIVE

## 2017-02-07 LAB — LACTATE DEHYDROGENASE: LDH: 2002 U/L — ABNORMAL HIGH (ref 98–192)

## 2017-02-07 LAB — PROCALCITONIN: Procalcitonin: 1.1 ng/mL

## 2017-02-07 LAB — TYPE AND SCREEN
ABO/RH(D): A POS
Antibody Screen: NEGATIVE

## 2017-02-07 LAB — ABO/RH: ABO/RH(D): A POS

## 2017-02-07 MED ORDER — SODIUM CHLORIDE 0.9 % IV BOLUS (SEPSIS)
500.0000 mL | Freq: Once | INTRAVENOUS | Status: AC
  Administered 2017-02-07: 500 mL via INTRAVENOUS

## 2017-02-07 MED ORDER — ACETAMINOPHEN 325 MG PO TABS
650.0000 mg | ORAL_TABLET | ORAL | Status: DC | PRN
  Administered 2017-02-07 – 2017-02-08 (×3): 650 mg via ORAL
  Filled 2017-02-07 (×3): qty 2

## 2017-02-07 MED ORDER — CEFAZOLIN SODIUM-DEXTROSE 2-4 GM/100ML-% IV SOLN
2.0000 g | Freq: Three times a day (TID) | INTRAVENOUS | Status: DC
  Administered 2017-02-07 – 2017-02-09 (×7): 2 g via INTRAVENOUS
  Filled 2017-02-07 (×8): qty 100

## 2017-02-07 MED ORDER — GABAPENTIN 300 MG PO CAPS
300.0000 mg | ORAL_CAPSULE | Freq: Three times a day (TID) | ORAL | Status: DC
  Administered 2017-02-07 – 2017-02-08 (×4): 300 mg via ORAL
  Filled 2017-02-07 (×4): qty 1

## 2017-02-07 NOTE — Progress Notes (Signed)
Patient states that her leg is hurting and she is not sure that she can tolerate being still for approximately 20-30 minutes for MRI. Will re try at a later time.

## 2017-02-07 NOTE — Progress Notes (Signed)
PHARMACY - PHYSICIAN COMMUNICATION CRITICAL VALUE ALERT - BLOOD CULTURE IDENTIFICATION (BCID)  Jo Collins is an 67 y.o. female who presented to [facility] on 02/05/2017 with a chief complaint of needs workup to rule out brain metastases.   Assessment:  WBCs=21.4, Temp=100.2  (include suspected source if known)  Name of physician (or Provider) Contacted: Lamar Blinks, NP  Current antibiotics: None  Changes to prescribed antibiotics recommended:  Recommendations accepted by provider  Start Ancef 2 Gm IV q8h  Results for orders placed or performed during the hospital encounter of 02/05/17  Blood Culture ID Panel (Reflexed) (Collected: 02/05/2017 11:02 PM)  Result Value Ref Range   Enterococcus species NOT DETECTED NOT DETECTED   Listeria monocytogenes NOT DETECTED NOT DETECTED   Staphylococcus species DETECTED (A) NOT DETECTED   Staphylococcus aureus NOT DETECTED NOT DETECTED   Methicillin resistance NOT DETECTED NOT DETECTED   Streptococcus species NOT DETECTED NOT DETECTED   Streptococcus agalactiae NOT DETECTED NOT DETECTED   Streptococcus pneumoniae NOT DETECTED NOT DETECTED   Streptococcus pyogenes NOT DETECTED NOT DETECTED   Acinetobacter baumannii NOT DETECTED NOT DETECTED   Enterobacteriaceae species PENDING NOT DETECTED   Enterobacter cloacae complex NOT DETECTED NOT DETECTED   Escherichia coli NOT DETECTED NOT DETECTED   Klebsiella oxytoca NOT DETECTED NOT DETECTED   Klebsiella pneumoniae NOT DETECTED NOT DETECTED   Proteus species PENDING NOT DETECTED   Serratia marcescens NOT DETECTED NOT DETECTED   Carbapenem resistance NOT DETECTED NOT DETECTED   Haemophilus influenzae NOT DETECTED NOT DETECTED   Neisseria meningitidis NOT DETECTED NOT DETECTED   Pseudomonas aeruginosa NOT DETECTED NOT DETECTED   Candida albicans NOT DETECTED NOT DETECTED   Candida glabrata NOT DETECTED NOT DETECTED   Candida krusei NOT DETECTED NOT DETECTED   Candida parapsilosis NOT DETECTED NOT  DETECTED   Candida tropicalis NOT DETECTED NOT DETECTED    Dorrene German 02/07/2017  1:38 AM

## 2017-02-07 NOTE — Progress Notes (Signed)
Per ultrasound US abdomen with elastography can only be done as outpatient. Dr. Tawanna Solo text paged and made aware.

## 2017-02-07 NOTE — Progress Notes (Signed)
PROGRESS NOTE    Jo Collins  DDU:[id number] DOB: 11/10/1950 DOA: 02/05/2017 PCP: Deland Pretty, MD   Brief Narrative: Patient is a  67 y.o.femalewith medical history significantfordiffuse large B-cell lymphoma,recurrent right pleural effusion, PAF,intrathoracic mass, obesity.Patient saw her oncologist in clinic and was directly admitted to the hospitalist service. Patient reports onset of numbness on her chinjustbelow her lower lipover the past few days,initially in the centerof her chin and spread laterally,complains of numbness involvingonlyher dorsal surface ofherleft foot,around her second metatarsal.Patient denies weakness of her extremities,facial asymmetry,slurred speech or vision changes. Patient had CT guided biopsy of her right external iliac chain lymph node1/22/19, forconcerns ofB-cell lymphoma recurrence. Patient's WBC when she saw her oncologist was elevated at 22.7.Recommended admission for workupto include brain MRI to rule out brain metastases,CT chest/abdomen/pelvis to evaluate lymphoma progression,possible sacral/gluteal cleftcollection. MRI brain has been done and it doesnot show any acute intracranial abnormalities. CT chest/abdomen/pelvis summary :New chest wall mass involving the musculature of the right, anterior inferior chest wall, spanning from approximately the right sixth through the right eighth rib cartilages. There is a new adjacent subcutaneous soft tissue mass measuring 14 mm. Findings are consistent with new areas of soft tissue lymphoma. In the pelvis, there is enlargement of a left obturator lymph node when compared to the recent PET-CT, consistent with progression lymphoma. There is mild irregular wall thickening noted along the gallbladder mostly at the fundus. This could be a benign etiology, specifically adenomyomatosis.Infiltration of the gallbladder wall with lymphoma is possible.  Patient's blood cultures are also  positive for  coagulase-negative staph.  She has persistent leukocytosis.  Assessment & Plan:   Principal Problem:   Numbness Active Problems:   Intrathoracic mass   Morbid obesity with BMI of 50.0-59.9, adult (HCC)   DLBCL (diffuse large B cell lymphoma) (HCC)   Recurrent right pleural effusion   PAF (paroxysmal atrial fibrillation) ([place])  New intrathoracic mass: CT chest finding as above.Dr Irene Limbo will be evaluating the patient. We will also order a right upper quadrant ultrasound for gallbladder abnormality seen as above.  Positive blood cultures: Cultures growing methicillin sensitive staph.  Will follow final sensitivity report.  Patient also has leukocytosis.  Low-grade fever this morning.  Continue antibiotics.  Started on Cefazoline.  DLBC lymphoma: Oncology will be following. Increased LDH noted.  Numbness/tingling sensation: Presented with this symptom.  The symptoms are mainly on her chin and left lower extremity.? Neuropathy.Started on gabapentin. Patient does not complain of any weakness.  MRI did not show any brain mass. Also requested for physical therapy evaluation. Venous Doppler negative for DVT  Anemia of chronic disease: Most likely stated with her malignancy.  We will continue to monitor H&H.  PAF (paroxysmal atrial fibrillation): On aspirin. HR controlled at this time   Morbid obesity: Counseled on diet and exercise   DVT prophylaxis: Lovenox Code Status: Full Family Communication: No family members present at the bedside Disposition Plan: Home   Consultants: Oncology  Procedures:None  Antimicrobials:Cefazoline since 02/07/17  Subjective: Patient seen and examined the bedside this morning.  Still complains of numbness in her chin and left lower extremity.  Updated the findings of CAT scan and MRI brain.   Objective: Vitals:   02/06/17 0436 02/06/17 1408 02/06/17 2118 02/07/17 0510  BP: 108/85 117/75 123/76 123/80  Pulse: (!) 53 83 92 86    Resp: 20 18 16 16   Temp: 98.8 F (37.1 C) 98.8 F (37.1 C) 100.2 F (37.9 C) 99.1 F (37.3 C)  TempSrc: Oral  Oral Oral Oral  SpO2: 97% 95% 95% 96%    Intake/Output Summary (Last 24 hours) at 02/07/2017 1301 Last data filed at 02/07/2017 1011 Gross per 24 hour  Intake 595 ml  Output -  Net 595 ml   There were no vitals filed for this visit.  Examination:  General exam: Appears calm and comfortable ,Not in distress,obese Respiratory system: Bilateral equal air entry, normal vesicular breath sounds, no wheezes or crackles  Cardiovascular system: S1 & S2 heard, RRR. No JVD, murmurs, rubs, gallops or clicks. No pedal edema. Gastrointestinal system: Abdomen is nondistended, soft and nontender. No organomegaly or masses felt. Normal bowel sounds heard. Central nervous system: Alert and oriented. No focal neurological deficits. Extremities: Trace edema, no clubbing ,no cyanosis, distal peripheral pulses palpable. Skin: No rashes, lesions or ulcers,no icterus ,no pallor Psychiatry: Judgement and insight appear normal. Mood & affect appropriate.     Data Reviewed: I have personally reviewed following labs and imaging studies  CBC: Recent Labs  Lab 02/05/17 1502 02/06/17 0359 02/07/17 0343  WBC 22.7* 21.4* 22.9*  NEUTROABS 17.5*  --   --   HGB  --  9.3* 8.3*  HCT 33.8* 29.7* 26.4*  MCV 86.9 86.8 85.7  PLT 204 185 790   Basic Metabolic Panel: Recent Labs  Lab 02/05/17 1502 02/06/17 0359 02/07/17 0343  NA 140 138 135  K 3.5 3.0* 3.5  CL 105 104 103  CO2 22 24 24   GLUCOSE 94 100* 106*  BUN 12 12 13   CREATININE 1.02 0.87 0.93  CALCIUM 9.4 8.7* 8.6*   GFR: Estimated Creatinine Clearance: 72 mL/min (by C-G formula based on SCr of 0.93 mg/dL). Liver Function Tests: Recent Labs  Lab 02/05/17 1502  AST 37*  ALT 14  ALKPHOS 63  BILITOT 0.5  PROT 6.8  ALBUMIN 3.1*   No results for input(s): LIPASE, AMYLASE in the last 168 hours. No results for input(s): AMMONIA in  the last 168 hours. Coagulation Profile: No results for input(s): INR, PROTIME in the last 168 hours. Cardiac Enzymes: No results for input(s): CKTOTAL, CKMB, CKMBINDEX, TROPONINI in the last 168 hours. BNP (last 3 results) No results for input(s): PROBNP in the last 8760 hours. HbA1C: No results for input(s): HGBA1C in the last 72 hours. CBG: No results for input(s): GLUCAP in the last 168 hours. Lipid Profile: No results for input(s): CHOL, HDL, LDLCALC, TRIG, CHOLHDL, LDLDIRECT in the last 72 hours. Thyroid Function Tests: No results for input(s): TSH, T4TOTAL, FREET4, T3FREE, THYROIDAB in the last 72 hours. Anemia Panel: No results for input(s): VITAMINB12, FOLATE, FERRITIN, TIBC, IRON, RETICCTPCT in the last 72 hours. Sepsis Labs: No results for input(s): PROCALCITON, LATICACIDVEN in the last 168 hours.  Recent Results (from the past 240 hour(s))  Culture, blood (routine x 2)     Status: None (Preliminary result)   Collection Time: 02/05/17 11:02 PM  Result Value Ref Range Status   Specimen Description   Final    BLOOD RIGHT ARM Performed at [facility] [address], [place], [place] [address]    Special Requests   Final    IN PEDIATRIC BOTTLE Blood Culture adequate volume Performed at [facility] [address] [place], [place] [address]    Culture  Setup Time   Final    GRAM POSITIVE COCCI IN PEDIATRIC BOTTLE CRITICAL RESULT CALLED TO, READ BACK BY AND VERIFIED WITH: B GREEN PHARMD 02/07/17 0127 JDW Performed at [facility], [address] [place], [place] [address]    Culture GRAM POSITIVE COCCI  Final   Report Status PENDING  Incomplete  Blood Culture ID Panel (Reflexed)     Status: Abnormal   Collection Time: 02/05/17 11:02 PM  Result Value Ref Range Status   Enterococcus species NOT DETECTED NOT DETECTED Final   Listeria monocytogenes NOT DETECTED NOT DETECTED Final   Staphylococcus species DETECTED (A) NOT  DETECTED Final    Comment: Methicillin (oxacillin) susceptible coagulase negative staphylococcus. Possible blood culture contaminant (unless isolated from more than one blood culture draw or clinical case suggests pathogenicity). No antibiotic treatment is indicated for blood  culture contaminants. CRITICAL RESULT CALLED TO, READ BACK BY AND VERIFIED WITH: B GREEN PHARMD 02/07/17 0127 JDW    Staphylococcus aureus NOT DETECTED NOT DETECTED Final   Methicillin resistance NOT DETECTED NOT DETECTED Final   Streptococcus species NOT DETECTED NOT DETECTED Final   Streptococcus agalactiae NOT DETECTED NOT DETECTED Final   Streptococcus pneumoniae NOT DETECTED NOT DETECTED Final   Streptococcus pyogenes NOT DETECTED NOT DETECTED Final   Acinetobacter baumannii NOT DETECTED NOT DETECTED Final   Enterobacteriaceae species NOT DETECTED NOT DETECTED Final   Enterobacter cloacae complex NOT DETECTED NOT DETECTED Final   Escherichia coli NOT DETECTED NOT DETECTED Final   Klebsiella oxytoca NOT DETECTED NOT DETECTED Final   Klebsiella pneumoniae NOT DETECTED NOT DETECTED Final   Proteus species NOT DETECTED NOT DETECTED Final   Serratia marcescens NOT DETECTED NOT DETECTED Final   Carbapenem resistance NOT DETECTED NOT DETECTED Final   Haemophilus influenzae NOT DETECTED NOT DETECTED Final   Neisseria meningitidis NOT DETECTED NOT DETECTED Final   Pseudomonas aeruginosa NOT DETECTED NOT DETECTED Final   Candida albicans NOT DETECTED NOT DETECTED Final   Candida glabrata NOT DETECTED NOT DETECTED Final   Candida krusei NOT DETECTED NOT DETECTED Final   Candida parapsilosis NOT DETECTED NOT DETECTED Final   Candida tropicalis NOT DETECTED NOT DETECTED Final         Radiology Studies:       Scheduled Meds: . aspirin EC  81 mg Oral Daily  . enoxaparin (LOVENOX) injection  60 mg Subcutaneous Q24H  . gabapentin  300 mg Oral TID  .  morphine injection  2 mg Intravenous Once   Continuous  Infusions: .  ceFAZolin (ANCEF) IV Stopped (02/07/17 0355)     LOS: 2 days    Time spent: 25 mins    Jo Kelty Jodie Echevaria, MD [facility] Pager [phone]  If 7PM-7AM, please contact night-coverage [email or web address] Password [email or web address] 02/07/2017, 1:01 PM

## 2017-02-07 NOTE — Telephone Encounter (Signed)
Per 2/4 los already scheduled

## 2017-02-07 NOTE — Progress Notes (Signed)
PT Cancellation Note  Patient Details Name: Jo Collins MRN: [id number] DOB: Apr 20, 1950   Cancelled Treatment:    Reason Eval/Treat Not Completed: Fatigue/lethargy limiting ability to participate. Attempted to perform skilled PT evaluation today, patient very lethargic this afternoon with difficulty keeping eyes open and falling asleep in middle of conversation with PT. Family reports that she just got pain medicine which tends to make her very sleepy. Plan to attempt back later as schedule allows.    Deniece Ree PT, DPT, CBIS  Supplemental Physical Therapist [facility]   Pager [phone]

## 2017-02-08 ENCOUNTER — Telehealth: Payer: Self-pay | Admitting: *Deleted

## 2017-02-08 ENCOUNTER — Ambulatory Visit: Payer: Medicare Other | Admitting: Hematology

## 2017-02-08 ENCOUNTER — Ambulatory Visit: Payer: Medicare Other

## 2017-02-08 ENCOUNTER — Inpatient Hospital Stay (HOSPITAL_COMMUNITY): Payer: Medicare Other

## 2017-02-08 ENCOUNTER — Other Ambulatory Visit: Payer: Self-pay | Admitting: Hematology

## 2017-02-08 DIAGNOSIS — R935 Abnormal findings on diagnostic imaging of other abdominal regions, including retroperitoneum: Secondary | ICD-10-CM

## 2017-02-08 LAB — LACTATE DEHYDROGENASE: LDH: 2624 U/L — ABNORMAL HIGH (ref 98–192)

## 2017-02-08 LAB — CBC WITH DIFFERENTIAL/PLATELET
Band Neutrophils: 23 %
Basophils Absolute: 0 10*3/uL (ref 0.0–0.1)
Basophils Relative: 0 %
Eosinophils Absolute: 0.2 10*3/uL (ref 0.0–0.7)
Eosinophils Relative: 1 %
HCT: 28.3 % — ABNORMAL LOW (ref 36.0–46.0)
HEMOGLOBIN: 8.8 g/dL — AB (ref 12.0–15.0)
LYMPHS ABS: 1.2 10*3/uL (ref 0.7–4.0)
Lymphocytes Relative: 7 %
MCH: 26.7 pg (ref 26.0–34.0)
MCHC: 31.1 g/dL (ref 30.0–36.0)
MCV: 85.8 fL (ref 78.0–100.0)
METAMYELOCYTES PCT: 4 %
MONOS PCT: 5 %
MYELOCYTES: 6 %
Monocytes Absolute: 0.9 10*3/uL (ref 0.1–1.0)
NEUTROS ABS: 15.4 10*3/uL — AB (ref 1.7–7.7)
Neutrophils Relative %: 54 %
Platelets: 91 10*3/uL — ABNORMAL LOW (ref 150–400)
RBC: 3.3 MIL/uL — AB (ref 3.87–5.11)
RDW: 15.9 % — AB (ref 11.5–15.5)
WBC Morphology: INCREASED
WBC: 17.7 10*3/uL — AB (ref 4.0–10.5)

## 2017-02-08 LAB — CULTURE, BLOOD (ROUTINE X 2): SPECIAL REQUESTS: ADEQUATE

## 2017-02-08 LAB — HAPTOGLOBIN: Haptoglobin: 290 mg/dL — ABNORMAL HIGH (ref 34–200)

## 2017-02-08 LAB — PROCALCITONIN: PROCALCITONIN: 1.42 ng/mL

## 2017-02-08 MED ORDER — LORAZEPAM 2 MG/ML IJ SOLN: 1.0000 mg | Freq: Once | INTRAMUSCULAR | Status: DC

## 2017-02-08 MED ORDER — DULOXETINE HCL 30 MG PO CPEP
30.0000 mg | ORAL_CAPSULE | Freq: Every day | ORAL | Status: DC
  Administered 2017-02-08 – 2017-02-10 (×3): 30 mg via ORAL
  Filled 2017-02-08 (×3): qty 1

## 2017-02-08 MED ORDER — MORPHINE SULFATE (PF) 2 MG/ML IV SOLN
2.0000 mg | Freq: Once | INTRAVENOUS | Status: AC
  Administered 2017-02-08: 2 mg via INTRAVENOUS
  Filled 2017-02-08: qty 1

## 2017-02-08 MED ORDER — LIP MEDEX EX OINT
TOPICAL_OINTMENT | CUTANEOUS | Status: AC
  Filled 2017-02-08: qty 7

## 2017-02-08 MED ORDER — OXYCODONE-ACETAMINOPHEN 5-325 MG PO TABS
1.0000 | ORAL_TABLET | ORAL | Status: DC | PRN
Start: 2017-02-08 — End: 2017-02-10
  Administered 2017-02-08 – 2017-02-10 (×8): 2 via ORAL
  Filled 2017-02-08 (×8): qty 2

## 2017-02-08 MED ORDER — HYDROMORPHONE HCL 1 MG/ML IJ SOLN
0.2000 mg | INTRAMUSCULAR | Status: DC | PRN
  Administered 2017-02-08: 0.4 mg via INTRAVENOUS
  Filled 2017-02-08: qty 1

## 2017-02-08 NOTE — Progress Notes (Signed)
[name]  HEMATOLOGY ONCOLOGY INPATIENT PROGRESS NOTE  Date of service: 02/08/17 Patient Care Team: Deland Pretty, MD as PCP - General (Internal Medicine)  CC: post hospitalization f/u for management of newly diagnosed diffuse large B cell lymphoma  Diagnosis: Diffuse large B cell lymphoma  Current Treatment: R-CHOP with G-CSF support s/p R-CHOP x 6 cycles.  HISTORY OF PRESENT ILLNESS: please see original clinic note for HPI  INTERVAL HISTORY:  Mrs. Warnke notes that the pain in her legs is significant and intermittent, and feels better when she walks. She denies chest wall pain, and new back pain. She reports that her chin numbness is persistent. The pt reports some worry about the mounting medical bills.  She reports that her family is very supportive, involved, and concerned for her. She expressed that she agrees to [facility] B[facility], salvage chemotherapy, and seeing Dr. Raylene Everts at [facility]. She continues to express significant anxiety about missing work and requests having her chemotherapy treatment on the weekend if possible. She is having difficulty coming to grips with the current situation.   REVIEW OF SYSTEMS:    A 10+ POINT REVIEW OF SYSTEMS WAS OBTAINED including neurology, dermatology, psychiatry, cardiac, respiratory, lymph, extremities, GI, GU, Musculoskeletal, constitutional, breasts, reproductive, HEENT.  All pertinent positives are noted in the HPI.  All others are negative.  . Past Medical History:  Diagnosis Date  . Abdominal pain, RUQ   . Abnormal EKG 08/07/2016  . Acute midline thoracic back pain   . Acute respiratory distress 09/25/2016  . Anemia 08/07/2016  . Chronic low back pain 09/25/2016  . Cough   . DLBCL (diffuse large B cell lymphoma) ([place]) 08/17/2016  . GERD (gastroesophageal reflux disease)   . History of chemotherapy   . Hypokalemia 09/25/2016  . 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 Lymphadenopathy   . Morbid obesity with BMI of 50.0-59.9, adult ([place])   . Neutrophilic  leukocytosis 38/01/173  . NHL (non-Hodgkin's lymphoma) ([place])   . Pleural effusion on right 08/07/2016  . Recurrent pleural effusion on right 10/08/2016  . Recurrent right pleural effusion 09/25/2016  . S/P thoracentesis   . Shortness of breath 08/07/2016  . SOB (shortness of breath) 09/26/2016    Past Surgical History:  Procedure Laterality Date  . ABDOMINAL HYSTERECTOMY    . IR CV LINE INJECTION  10/26/2016  . IR FLUORO GUIDE PORT INSERTION RIGHT  08/18/2016  . IR FLUORO GUIDE PORT INSERTION RIGHT  11/03/2016  . IR REMOVAL TUN CV CATH W/O FL  11/03/2016  . IR THORACENTESIS ASP PLEURAL SPACE W/IMG GUIDE  08/07/2016  . IR THORACENTESIS ASP PLEURAL SPACE W/IMG GUIDE  08/14/2016  . IR US GUIDE VASC ACCESS RIGHT  08/18/2016  . IR US GUIDE VASC ACCESS RIGHT  11/03/2016    . Social History   Tobacco Use  . Smoking status: Former Smoker    Packs/day: 0.50    Years: 47.00    Pack years: 23.50    Types: Cigarettes    Last attempt to quit: 07/28/2016    Years since quitting: 0.5  . Smokeless tobacco: Never Used  Substance Use Topics  . Alcohol use: No  . Drug use: No    ALLERGIES:  has No Known Allergies.  MEDICATIONS:  Current Facility-Administered Medications  Medication Dose Route Frequency Provider Last Rate Last Dose  . acetaminophen (TYLENOL) tablet 650 mg  650 mg Oral Q4H PRN Schorr, Rhetta Mura, NP   650 mg at 02/08/17 1433  . aspirin EC tablet 81 mg  81 mg Oral Daily Emokpae, Ejiroghene E, MD   81 mg at 02/08/17 1003  . ceFAZolin (ANCEF) IVPB 2g/100 mL premix  2 g Intravenous Q8H Schorr, Rhetta Mura, NP 200 mL/hr at 02/08/17 1430 2 g at 02/08/17 1430  . enoxaparin (LOVENOX) injection 60 mg  60 mg Subcutaneous Q24H Emokpae, Ejiroghene E, MD   60 mg at 02/07/17 2045  . gabapentin (NEURONTIN) capsule 300 mg  300 mg Oral TID Marene Lenz, MD   300 mg at 02/08/17 1618  . oxyCODONE-acetaminophen (PERCOCET/ROXICET) 5-325 MG per tablet 1 tablet  1 tablet Oral Q6H PRN Emokpae,  Ejiroghene E, MD   1 tablet at 02/08/17 1245    PHYSICAL EXAMINATION: ECOG PERFORMANCE STATUS: 1 - Symptomatic but completely ambulatory  . Vitals:   02/08/17 0426 02/08/17 1419  BP: (!) 100/58 102/66  Pulse: 81 100  Resp: 16 18  Temp: 99.5 F (37.5 C) 99.5 F (37.5 C)  SpO2: 95% 95%    Filed Weights   02/08/17 0244  Weight: 253 lb 8.5 oz (115 kg)   .Body mass index is 47.9 kg/m.  GENERAL:alert, in no acute distress and comfortable SKIN: no acute rashes, no significant lesions EYES: conjunctiva are pink and non-injected, sclera anicteric OROPHARYNX: MMM, no exudates, no oropharyngeal erythema or ulceration NECK: supple, no JVD LYMPH:  no palpable lymphadenopathy in the cervical, axillary or inguinal regions LUNGS: distant breath sounds. Decreased AE rt base about 1/4 up posterior chest wall. HEART: S1/S2 irregular rate & rhythm ABDOMEN:  normoactive bowel sounds , non tender, not distended. Extremity: grade 1 pedal edema PSYCH: alert & oriented x 3 with fluent speech NEURO: no focal motor/sensory deficits   LABORATORY DATA:   I have reviewed the data as listed  . CBC Latest Ref Rng & Units 02/08/2017 02/07/2017 02/06/2017  WBC 4.0 - 10.5 K/uL 17.7(H) 22.9(H) 21.4(H)  Hemoglobin 12.0 - 15.0 g/dL 8.8(L) 8.3(L) 9.3(L)  Hematocrit 36.0 - 46.0 % 28.3(L) 26.4(L) 29.7(L)  Platelets 150 - 400 K/uL 91(L) 153 185    . CMP Latest Ref Rng & Units 02/07/2017 02/06/2017 02/05/2017  Glucose 65 - 99 mg/dL 106(H) 100(H) 94  BUN 6 - 20 mg/dL 13 12 12   Creatinine 0.44 - 1.00 mg/dL 0.93 0.87 1.02  Sodium 135 - 145 mmol/L 135 138 140  Potassium 3.5 - 5.1 mmol/L 3.5 3.0(L) 3.5  Chloride 101 - 111 mmol/L 103 104 105  CO2 22 - 32 mmol/L 24 24 22   Calcium 8.9 - 10.3 mg/dL 8.6(L) 8.7(L) 9.4  Total Protein 6.4 - 8.3 g/dL - - 6.8  Total Bilirubin 0.2 - 1.2 mg/dL - - 0.5  Alkaline Phos 40 - 150 U/L - - 63  AST 5 - 34 U/L - - 37(H)  ALT 0 - 55 U/L - - 14   LDH 1900's   RADIOGRAPHIC  STUDIES: I have personally reviewed the radiological images as listed and agreed with the findings in the report. Dg Tibia/fibula Left  Result Date: 02/06/2017 CLINICAL DATA:  Leg pain.  History of lymphoma EXAM: LEFT TIBIA AND FIBULA - 2 VIEW COMPARISON:  None. FINDINGS: There is no evidence of fracture or other focal bone lesions. Soft tissues are unremarkable. IMPRESSION: Negative. Electronically Signed   By: Franchot Gallo M.D.   On: 02/06/2017 08:48   Ct Chest W Contrast  Result Date: 02/06/2017 CLINICAL DATA:  H/o diffuse large B cell lymphoma dx'd 9/18, last chemo 12/18 Lt groin, leg, foot pain & numbness since 1/19 Recurrent  rt pleural effusion EXAM: CT CHEST, ABDOMEN, AND PELVIS WITH CONTRAST TECHNIQUE: Multidetector CT imaging of the chest, abdomen and pelvis was performed following the standard protocol during bolus administration of intravenous contrast. CONTRAST:  100 mL of Isovue-300 intravenous contrast COMPARISON:  PET-CT, 01/05/2017 and 10/21/2016. FINDINGS: CT CHEST FINDINGS Cardiovascular: Heart is normal in size. No pericardial effusion. No significant coronary artery calcifications. Aorta is normal in caliber. No aortic dissection or atherosclerosis. Mild prominence of the main pulmonary artery measuring 3.5 cm. Mediastinum/Nodes: Ill-defined low-attenuation soft tissue lies adjacent to the lower thoracic spine vertebra and contacts the descending thoracic aorta, without convincing change most recent PET-CT. No discrete mediastinal or hilar masses and no pathologically enlarged lymph nodes. Trachea is widely patent. Esophagus is unremarkable. No neck base or axillary masses or adenopathy. Lungs/Pleura: Small right pleural effusion. Opacity the anterolateral right upper lobe and in the right middle lobe, consistent with atelectasis, is stable from the prior PET-CT. Some pleural based opacity along the anterior right upper lobe more superiorly has mildly improved. Mild pleural base  parenchymal opacity is noted in the right lower lobe, most likely atelectasis, mildly improved from the PET-CT. Left lung is clear. No left pleural effusion. No pneumothorax. Musculoskeletal: There is thickening of the right anterior inferior chest wall musculature extending superiorly from the right sixth rib costal cartilage to the right eighth rib costal cartilage. Adjacent to this, at the level of the eighth rib costal cartilage, there is a 14 mm irregular soft tissue nodule. These findings are new from the recent PET-CT. The muscular thickening measures 2.3 cm, anterior to posterior. No osteoblastic or osteolytic lesions. No fracture or acute finding. CT ABDOMEN PELVIS FINDINGS Hepatobiliary: Liver mildly enlarged, right lobe measuring 2.4 cm from superior inferior. No liver mass or focal lesion. There is irregular wall thickening of the gallbladder most evident along the fundus focally. No evidence of gallstones. No pericholecystic inflammation. No bile duct dilation. Pancreas: Unremarkable. No pancreatic ductal dilatation or surrounding inflammatory changes. Spleen: Spleen borderline enlarged measuring 11.5 x 5.5 x 11.8 cm, without significant change when compared to the most recent prior PET-CT. No splenic mass or focal lesion. Adrenals/Urinary Tract: No adrenal masses. Subcentimeter lower pole cyst arises from the left kidney. No other renal masses, no stones and no hydronephrosis. Symmetric renal enhancement and excretion. Normal ureters. Bladder is unremarkable. Stomach/Bowel: No stomach, small bowel or colonic mass. No mesenteric masses. Bowel is normal in caliber. Stomach and small bowel are unremarkable. There are multiple left colon diverticula. No diverticulitis or other colonic inflammatory process. Normal appendix is visualized. Vascular/Lymphatic: Enlarged obturator node on the left surrounds a left internal iliac artery branch. This measures 3.8 x 3.6 cm, increased from 2.7 x 2.0 cm on the prior  PET-CT. There is an adjacent left external iliac chain lymph node measuring 17 mm in short axis, stable from the PET-CT. There are 2 adjacent right external iliac chain lymph nodes, largest measuring 2.8 x 1.8 cm, also without change. No other enlarged lymph nodes. Aorta is normal in caliber. No atherosclerosis. No significant vascular abnormality. Reproductive: Status post hysterectomy. No adnexal masses. Other: No abdominal wall hernia. There is a focus of increased attenuation in the subcutaneous fat of the right upper abdomen wall which may be from a soft tissue injection. This does not appear to be a discrete mass. Calcified soft tissue granuloma are noted in the left lateral upper chest wall subcutaneous fat. No ascites. Musculoskeletal: No osteoblastic or osteolytic lesions. IMPRESSION: 1. There  is a new chest wall mass involving the musculature of the right, anterior inferior chest wall, spanning from approximately the right sixth through the right eighth rib cartilages. There is a new adjacent subcutaneous soft tissue mass measuring 14 mm. Findings are consistent with new areas of soft tissue lymphoma. 2. In the pelvis, there is enlargement of a left obturator lymph node when compared to the recent PET-CT, consistent with progression lymphoma. 3. Previously described soft tissue along the descending thoracic aorta is without significant change from the most recent prior CT. 4. Small right pleural effusion and areas right lung pleural based opacity are similar to the PET-CT, with subtle areas of improvement of some of the pleural-based right lung opacity. 5. There is mild irregular wall thickening noted along the gallbladder mostly at the fundus. This could be a benign etiology, specifically adenomyomatosis. Infiltration of the gallbladder wall with lymphoma is possible. Consider further assessment with limited right upper quadrant ultrasound. 6. Mild hepatomegaly and borderline enlargement of the spleen is  stable from the recent PET-CT. Electronically Signed   By: Lajean Manes M.D.   On: 02/06/2017 14:28   Mr Brain Wo Contrast  Result Date: 02/06/2017 CLINICAL DATA:  Numbness or tingling, paresthesia. Numbness involves the chin and left lower extremity. History of non-Hodgkin's lymphoma. EXAM: MRI HEAD WITHOUT CONTRAST TECHNIQUE: Multiplanar, multiecho pulse sequences of the brain and surrounding structures were obtained without intravenous contrast. COMPARISON:  None. FINDINGS: Brain: Incomplete study with diffusion, sagittal T1, axial FLAIR, axial T2, and axial gradient acquired. No acute infarct, blood products, hydrocephalus, or masslike finding. There is FLAIR hyperintensity about the atria of the lateral ventricles where there is mild apparent ependymal bulging towards the ventricles. This is likely due to slice angle given the symmetric appearance and lack of restricted diffusion in this area. These signal changes are usually seen with chronic small vessel ischemia. No suspected mass. Partially empty sella Vascular: Major flow voids are preserved. Skull and upper cervical spine: No evidence of marrow lesion. Sinuses/Orbits: Negative IMPRESSION: 1. Partial, noncontrast brain MRI due to patient discomfort. 2. No acute finding or explanation for symptoms. Electronically Signed   By: Monte Fantasia M.D.   On: 02/06/2017 13:18   Ct Abdomen Pelvis W Contrast  Result Date: 02/06/2017 CLINICAL DATA:  H/o diffuse large B cell lymphoma dx'd 9/18, last chemo 12/18 Lt groin, leg, foot pain & numbness since 1/19 Recurrent rt pleural effusion EXAM: CT CHEST, ABDOMEN, AND PELVIS WITH CONTRAST TECHNIQUE: Multidetector CT imaging of the chest, abdomen and pelvis was performed following the standard protocol during bolus administration of intravenous contrast. CONTRAST:  100 mL of Isovue-300 intravenous contrast COMPARISON:  PET-CT, 01/05/2017 and 10/21/2016. FINDINGS: CT CHEST FINDINGS Cardiovascular: Heart is normal in  size. No pericardial effusion. No significant coronary artery calcifications. Aorta is normal in caliber. No aortic dissection or atherosclerosis. Mild prominence of the main pulmonary artery measuring 3.5 cm. Mediastinum/Nodes: Ill-defined low-attenuation soft tissue lies adjacent to the lower thoracic spine vertebra and contacts the descending thoracic aorta, without convincing change most recent PET-CT. No discrete mediastinal or hilar masses and no pathologically enlarged lymph nodes. Trachea is widely patent. Esophagus is unremarkable. No neck base or axillary masses or adenopathy. Lungs/Pleura: Small right pleural effusion. Opacity the anterolateral right upper lobe and in the right middle lobe, consistent with atelectasis, is stable from the prior PET-CT. Some pleural based opacity along the anterior right upper lobe more superiorly has mildly improved. Mild pleural base parenchymal opacity is noted  in the right lower lobe, most likely atelectasis, mildly improved from the PET-CT. Left lung is clear. No left pleural effusion. No pneumothorax. Musculoskeletal: There is thickening of the right anterior inferior chest wall musculature extending superiorly from the right sixth rib costal cartilage to the right eighth rib costal cartilage. Adjacent to this, at the level of the eighth rib costal cartilage, there is a 14 mm irregular soft tissue nodule. These findings are new from the recent PET-CT. The muscular thickening measures 2.3 cm, anterior to posterior. No osteoblastic or osteolytic lesions. No fracture or acute finding. CT ABDOMEN PELVIS FINDINGS Hepatobiliary: Liver mildly enlarged, right lobe measuring 2.4 cm from superior inferior. No liver mass or focal lesion. There is irregular wall thickening of the gallbladder most evident along the fundus focally. No evidence of gallstones. No pericholecystic inflammation. No bile duct dilation. Pancreas: Unremarkable. No pancreatic ductal dilatation or  surrounding inflammatory changes. Spleen: Spleen borderline enlarged measuring 11.5 x 5.5 x 11.8 cm, without significant change when compared to the most recent prior PET-CT. No splenic mass or focal lesion. Adrenals/Urinary Tract: No adrenal masses. Subcentimeter lower pole cyst arises from the left kidney. No other renal masses, no stones and no hydronephrosis. Symmetric renal enhancement and excretion. Normal ureters. Bladder is unremarkable. Stomach/Bowel: No stomach, small bowel or colonic mass. No mesenteric masses. Bowel is normal in caliber. Stomach and small bowel are unremarkable. There are multiple left colon diverticula. No diverticulitis or other colonic inflammatory process. Normal appendix is visualized. Vascular/Lymphatic: Enlarged obturator node on the left surrounds a left internal iliac artery branch. This measures 3.8 x 3.6 cm, increased from 2.7 x 2.0 cm on the prior PET-CT. There is an adjacent left external iliac chain lymph node measuring 17 mm in short axis, stable from the PET-CT. There are 2 adjacent right external iliac chain lymph nodes, largest measuring 2.8 x 1.8 cm, also without change. No other enlarged lymph nodes. Aorta is normal in caliber. No atherosclerosis. No significant vascular abnormality. Reproductive: Status post hysterectomy. No adnexal masses. Other: No abdominal wall hernia. There is a focus of increased attenuation in the subcutaneous fat of the right upper abdomen wall which may be from a soft tissue injection. This does not appear to be a discrete mass. Calcified soft tissue granuloma are noted in the left lateral upper chest wall subcutaneous fat. No ascites. Musculoskeletal: No osteoblastic or osteolytic lesions. IMPRESSION: 1. There is a new chest wall mass involving the musculature of the right, anterior inferior chest wall, spanning from approximately the right sixth through the right eighth rib cartilages. There is a new adjacent subcutaneous soft tissue mass  measuring 14 mm. Findings are consistent with new areas of soft tissue lymphoma. 2. In the pelvis, there is enlargement of a left obturator lymph node when compared to the recent PET-CT, consistent with progression lymphoma. 3. Previously described soft tissue along the descending thoracic aorta is without significant change from the most recent prior CT. 4. Small right pleural effusion and areas right lung pleural based opacity are similar to the PET-CT, with subtle areas of improvement of some of the pleural-based right lung opacity. 5. There is mild irregular wall thickening noted along the gallbladder mostly at the fundus. This could be a benign etiology, specifically adenomyomatosis. Infiltration of the gallbladder wall with lymphoma is possible. Consider further assessment with limited right upper quadrant ultrasound. 6. Mild hepatomegaly and borderline enlargement of the spleen is stable from the recent PET-CT. Electronically Signed   By: Shanon Brow  Ormond M.D.   On: 02/06/2017 14:28   Ct Biopsy  Result Date: 01/23/2017 INDICATION: History of diffuse large B-cell lymphoma now with concern for recurrence. Please perform CT-guided biopsy of hypermetabolic right external iliac chain lymph node for tissue diagnostic purposes. EXAM: CT-GUIDED BIOPSY OF RIGHT EXTERNAL ILIAC CHAIN LYMPH NODE COMPARISON:  PET-CT - 01/05/2017 MEDICATIONS: None. ANESTHESIA/SEDATION: Fentanyl 100 mcg IV; Versed 2 mg IV Sedation time: 16 minutes; The patient was continuously monitored during the procedure by the interventional radiology nurse under my direct supervision. CONTRAST:  None. COMPLICATIONS: None immediate. PROCEDURE: Informed consent was obtained from the patient following an explanation of the procedure, risks, benefits and alternatives. A time out was performed prior to the initiation of the procedure. The patient was positioned supine on the CT table and a limited CT was performed for procedural planning demonstrating  unchanged size and appearance of known hypermetabolic right external iliac chain nodal conglomeration with dominant component measuring approximately 3.9 x 1.9 cm (image 18, series 3. The procedure was planned. The operative site was prepped and draped in the usual sterile fashion. Appropriate trajectory was confirmed with a 22 gauge spinal needle after the adjacent tissues were anesthetized with 1% Lidocaine with epinephrine. Under intermittent CT guidance, a 17 gauge coaxial needle was advanced into the peripheral aspect of the nodal conglomeration. Appropriate positioning was confirmed and 5 core needle biopsy samples were obtained with an 18 gauge core needle biopsy device. The co-axial needle was removed and hemostasis was achieved with manual compression. A limited postprocedural CT was negative for hemorrhage or additional complication. A dressing was placed. The patient tolerated the procedure well without immediate postprocedural complication. IMPRESSION: Technically successful CT guided core needle biopsy of hypermetabolic right external iliac chain nodal conglomeration. Electronically Signed   By: Sandi Mariscal M.D.   On: 01/23/2017 11:41   Vas [place] Lower Extremity Venous (dvt)  Result Date: 02/06/2017  Lower Venous Study Indication: Pain. Examination Guidelines: A complete evaluation includes B-mode imaging, spectral doppler, color doppler, and power doppler as needed of all accessible portions of each vessel. Bilateral testing is considered an integral part of a complete examination. Limited examinations for reoccurring indications may be performed as noted. The reflux portion of the exam is performed with the patient in reverse Trendelenburg.  Limitations: Body habitus.  Right Venous Findings: +---+---------------+---------+-----------+----------+-------+    CompressibilityPhasicitySpontaneityPropertiesSummary +---+---------------+---------+-----------+----------+-------+ CFVFull           Yes       Yes                          +---+---------------+---------+-----------+----------+-------+  Left Venous Findings: +---------+---------------+---------+-----------+----------+-------+          CompressibilityPhasicitySpontaneityPropertiesSummary +---------+---------------+---------+-----------+----------+-------+ CFV      Full           Yes      Yes                          +---------+---------------+---------+-----------+----------+-------+ FV Prox  Full                                                 +---------+---------------+---------+-----------+----------+-------+ FV Mid   Full                                                 +---------+---------------+---------+-----------+----------+-------+  FV DistalFull                                                 +---------+---------------+---------+-----------+----------+-------+ PFV      Full                                                 +---------+---------------+---------+-----------+----------+-------+ POP      Full           Yes      Yes                          +---------+---------------+---------+-----------+----------+-------+ PTV      Full                                                 +---------+---------------+---------+-----------+----------+-------+ PERO     Full                                                 +---------+---------------+---------+-----------+----------+-------+    Final Interpretation: Right: No evidence of common femoral vein obstruction. Left: There is no evidence of deep vein thrombosis in the lower extremity.There is no evidence of superficial venous thrombosis. No cystic structure found in the popliteal fossa.  *See table(s) above for measurements and observations. Electronically signed by Harold Barban on 02/06/2017 at 10:36:16 AM.   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  -PET scan skull base to thigh on 10/21/2016 with results revealing: IMPRESSION:1. There has been significant interval improvement in the chest, abdomen, and pelvis in the interval. The dominant mass in the chest is much smaller and less FDG avid in the interval. However, the remaining maximum uptake is slightly greater than mediastinal blood pool. The dominant remaining disease in the pelvis is an external iliac lymph node with a maximum SUV moderately greater than liver blood pool. Evaluation of the bones is more difficult today given the diffuse uptake likely associated with recent chemotherapy. I suspect the previously identified humeral lesion remains. Recommend continued attention to the bones on follow-up. Deauville Category 4  -PET scan skull base to thigh on 01/05/2017 r after 6 cycles of R-CHOP and IT MTX revealing: IMPRESSION:1. Mixed findings on the PET-CT. There are definite areas of significant continued improvement but also some new areas of involvement and slightly progressive pelvic adenopathy as detailed above. 2. Near complete resolution of the right pleural effusion. 3. Much improved osseous disease.  #3 Coag neg Staph Sepsis with borderline elevated procalcitonin levels. Decreased leucocytosis to 17k  Plan -  I had a detailed discussion with the patient about her labs and findings on her CT chest abdomen pelvis concerning for lymphoma progression. -MRI brain did not show any overt brain involvement of lymphoma. On antibiotics -cefazolin as per hospitalist for coag negative staph sepsis.  No evidence of overt abscess.  Repeat blood cultures NGTD -CT  bone marrow aspiration and Biopsy to evaluate pre-salvage extent of disease -Once infectious issues are stabilized will need to consider salvage chemotherapy with R-ICE  -Has been given urgent referral to [facility] to see Dr. Raylene Everts to consider salvage car T-cell therapy/clinical trial with cellular therapies. -I discussed in detail with the patient the concerning nature of her current presentation and the fact that this is going to need pretty involved treatments. -Appreciate excellent care by hospitalist team.  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 Advanced facet degenerative disease at this level results in 0.3 cm anterolisthesis.  Right worse than left facet arthropathy at L5-S1 with an associated right facet joint effusion. Facet arthropathy and disc cause mild right foraminal narrowing at this level. The central canal and left foramen are open.  PLAN  -Spine MRI shows lots of disk disease and nerve pinching but now lymphoma. Discussed that as soon as her infection subsides we can begin treating her lymphoma.  -Discussed that labs point to possible BM involvement. Asked pt if a BM Bx would be okay with her, and she consented.  -Discussed treatment of 2 cycles of next chemotherapy R-ICE q 3 weeks, inpatient would be for 4-5 days and outpatient would be different. -Discussed cellular therapy with the pt that she may receive at [facility] and her consult with Dr. Raylene Everts. -The pt reports continuing, significant pain in her legs. We will adjust her current percocet from 1 pill every 6 hours to 1-2 pills every 4 hours. -changed gabapentin to Cymbalta -prn IV dialudid. -monitor for excessive sedation given concern for sleep apnea  I spent 30 minutes counseling the patient face to face. The total time spent in  the appointment was 35 minutes and more than 50% was on counseling and direct patient cares.    Sullivan Lone MD [place] AAHIVMS [facility] Hematology/Oncology Physician [facility]  (Office):       [phone] (Work cell):  [phone] (Fax):           [phone]  This document serves as a record of services personally performed by Sullivan Lone, MD. It was created on his behalf by Baldwin Jamaica, a trained medical scribe. The creation of this record is based on the scribe's personal observations and the provider's statements to them.   .I have reviewed the above documentation for accuracy and completeness, and I agree with the above.] Sullivan Lone MD MS

## 2017-02-08 NOTE — Telephone Encounter (Signed)
Per Dr. Irene Limbo urgent referral made to [facility] to Dr. Raylene Everts for consideration of CAR-T therapy due to relapsed B-cell lymphoma.  Message left on patient referral line.  Recent records and demographics faxed to [phone].  Fax confirmation received, asked for call back to confirm patient apt.    Call back received, incorrect line for CAR-T therapy.  Called number provided and advised to fax patient referral to [phone].  Referral faxed, confirmation received.  Advised [facility] will contact patient to set up apt.

## 2017-02-08 NOTE — Progress Notes (Signed)
PROGRESS NOTE    Jo Collins  BPZ:[id number] DOB: [birth year] DOA: 02/05/2017 PCP: Deland Pretty, MD   Brief Narrative: Patient is a  67 y.o.femalewith medical history significantfordiffuse large B-cell lymphoma,recurrent right pleural effusion, PAF,intrathoracic mass, obesity.Patient saw her oncologist in clinic and was directly admitted to the hospitalist service. Patient reports onset of numbness on her chinjustbelow her lower lipover the past few days,initially in the centerof her chin and spread laterally,complains of numbness involvingonlyher dorsal surface ofherleft foot,around her second metatarsal.Patient denies weakness of her extremities,facial asymmetry,slurred speech or vision changes. Patient had CT guided biopsy of her right external iliac chain lymph node1/22/19, forconcerns ofB-cell lymphoma recurrence. Patient's WBC when she saw her oncologist was elevated at 22.7.Recommended admission for workupto include brain MRI to rule out brain metastases,CT chest/abdomen/pelvis to evaluate lymphoma progression,possible sacral/gluteal cleftcollection. MRI brain has been done and it doesnot show any acute intracranial abnormalities. CT chest/abdomen/pelvis summary :New chest wall mass involving the musculature of the right, anterior inferior chest wall, spanning from approximately the right sixth through the right eighth rib cartilages. There is a new adjacent subcutaneous soft tissue mass measuring 14 mm. Findings are consistent with new areas of soft tissue lymphoma. In the pelvis, there is enlargement of a left obturator lymph node when compared to the recent PET-CT, consistent with progression lymphoma. There is mild irregular wall thickening noted along the gallbladder mostly at the fundus. This could be a benign etiology, specifically adenomyomatosis.Infiltration of the gallbladder wall with lymphoma is possible.  Patient's blood cultures are also  positive for  coagulase-negative staph.Most likely contaminant.   She also has  leukocytosis.  Assessment & Plan:   Principal Problem:   Numbness Active Problems:   Intrathoracic mass   Morbid obesity with BMI of 50.0-59.9, adult (HCC)   DLBCL (diffuse large B cell lymphoma) (HCC)   Recurrent right pleural effusion   PAF (paroxysmal atrial fibrillation) (HCC)   Positive blood culture   Abnormal CT of the abdomen  New intrathoracic mass: CT chest finding as above.Dr Irene Limbo evaluated the patient and planning for starting chemotherapy.  Positive blood cultures: Cultures showed coagulase-negative staph.  Will follow final sensitivity report.  Patient also has leukocytosis.  Low-grade fever this morning.  Continue antibiotics.  Currently on Cefazoline.  DLBC lymphoma: Oncology  Following.Plan for starting chemotherapy Increased LDH noted.  Numbness/tingling sensation: Presented with this symptom.  The symptoms are mainly on her chin and left lower extremity.? Neuropathy.Started on gabapentin.There is some improvement on the symptoms today Patient does not complain of any weakness.  MRI did not show any brain mass. 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 PT commended outpatient physical therapy Venous Doppler negative for DVT  Anemia of chronic disease: Most likely stated with her malignancy.  We will continue to monitor H&H.  PAF (paroxysmal atrial fibrillation): On aspirin. HR controlled at this time   Morbid obesity: Counseled on diet and exercise   DVT prophylaxis: Lovenox Code Status: Full Family Communication: No family members present at the bedside Disposition Plan: Home   Consultants: Oncology  Procedures:None  Antimicrobials:Cefazoline since 02/07/17  Subjective: Patient seen and examined the bedside this morning.  Her numbness and pain symptoms have  improved but he still has numbness  on her chin. Vitals are stable.   Objective: Vitals:   02/07/17 1440 02/07/17 2113 02/08/17 0244 02/08/17 0426  BP: 119/74 138/67  (!) 100/58  Pulse: 82 95  81  Resp: 16 16  16   Temp: 98.6 F (37 C) (!) 102.9 F (39.4 C)  99.5 F (37.5 C)  TempSrc: Axillary Oral  Oral  SpO2: 97% 91%  95%  Weight:   115 kg (253 lb 8.5 oz)   Height:   5\' 1"  (1.549 m)     Intake/Output Summary (Last 24 hours) at 02/08/2017 1312 Last data filed at 02/08/2017 2458 Gross per 24 hour  Intake 600 ml  Output -  Net 600 ml   Filed Weights   02/08/17 0244  Weight: 115 kg (253 lb 8.5 oz)    Examination:  General exam: Appears calm and comfortable ,Not in distress,average built,obese Respiratory system: Bilateral equal air entry, normal vesicular breath sounds, no wheezes or crackles  Cardiovascular system: S1 & S2 heard, RRR. No JVD, murmurs, rubs, gallops or clicks.  Gastrointestinal system: Abdomen is nondistended, soft and nontender. No organomegaly or masses felt. Normal bowel sounds heard. Central nervous system: Alert and oriented. No focal neurological deficits. Extremities: No edema, no clubbing ,no cyanosis, distal peripheral pulses palpable. Skin: No cyanosis,No pallor,No Rash,No Ulcer Psychiatry: Judgement and insight appear normal. Mood & affect appropriate.  GU: No Foley    Data Reviewed: I have personally reviewed following labs and imaging studies  CBC: Recent Labs  Lab 02/05/17 1502 02/06/17 0359 02/07/17 0343 02/08/17 0353  WBC 22.7* 21.4* 22.9* 17.7*  NEUTROABS 17.5*  --   --  15.4*  HGB  --  9.3* 8.3* 8.8*  HCT 33.8* 29.7* 26.4* 28.3*  MCV 86.9 86.8 85.7 85.8  PLT 204 185 153 91*   Basic Metabolic Panel: Recent Labs  Lab 02/05/17 1502 02/06/17 0359 02/07/17 0343  NA 140 138 135  K 3.5 3.0* 3.5  CL 105 104 103  CO2 22 24 24   GLUCOSE 94 100* 106*  BUN 12 12 13   CREATININE 1.02 0.87 0.93  CALCIUM 9.4 8.7* 8.6*    GFR: Estimated Creatinine Clearance: 71.1 mL/min (by C-G formula based on SCr of 0.93 mg/dL). Liver Function Tests: Recent Labs  Lab 02/05/17 1502  AST 37*  ALT 14  ALKPHOS 63  BILITOT 0.5  PROT 6.8  ALBUMIN 3.1*   No results for input(s): LIPASE, AMYLASE in the last 168 hours. No results for input(s): AMMONIA in the last 168 hours. Coagulation Profile: No results for input(s): INR, PROTIME in the last 168 hours. Cardiac Enzymes: No results for input(s): CKTOTAL, CKMB, CKMBINDEX, TROPONINI in the last 168 hours. BNP (last 3 results) No results for input(s): PROBNP in the last 8760 hours. HbA1C: No results for input(s): HGBA1C in the last 72 hours. CBG: No results for input(s): GLUCAP in the last 168 hours. Lipid Profile: No results for input(s): CHOL, HDL, LDLCALC, TRIG, CHOLHDL, LDLDIRECT in the last 72 hours. Thyroid Function Tests: No results for input(s): TSH, T4TOTAL, FREET4, T3FREE, THYROIDAB in the last 72 hours. Anemia Panel: No results for input(s): VITAMINB12, FOLATE, FERRITIN, TIBC, IRON, RETICCTPCT in the last 72 hours. Sepsis Labs: Recent Labs  Lab 02/07/17 1336 02/08/17 0353  PROCALCITON 1.10 1.42    Recent Results (from the past 240 hour(s))  Culture, blood (routine x 2)     Status: None (Preliminary result)   Collection Time: 02/05/17 11:02 PM  Result Value Ref Range Status   Specimen Description   Final    BLOOD LEFT ANTECUBITAL Performed at [facility] [address], [place], [place] [address]    Special Requests  Final    BOTTLES DRAWN AEROBIC AND ANAEROBIC Blood Culture adequate volume Performed at [facility] [address], [place], [place] [address]    Culture   Final    NO GROWTH 1 DAY Performed at [facility], [place] [address] [place], [place] [address]    Report Status PENDING  Incomplete  Culture, blood (routine x 2)     Status: Abnormal   Collection Time: 02/05/17 11:02 PM   Result Value Ref Range Status   Specimen Description   Final    BLOOD RIGHT ARM Performed at [facility] [address] [place], [place] [address]    Special Requests   Final    IN PEDIATRIC BOTTLE Blood Culture adequate volume Performed at [facility] [address] [place] [address], [place], [place] [address]    Culture  Setup Time   Final    GRAM POSITIVE COCCI IN PEDIATRIC BOTTLE CRITICAL RESULT CALLED TO, READ BACK BY AND VERIFIED WITH: B GREEN PHARMD 02/07/17 0127 JDW    Culture (A)  Final    STAPHYLOCOCCUS SPECIES (COAGULASE NEGATIVE) THE SIGNIFICANCE OF ISOLATING THIS ORGANISM FROM A SINGLE SET OF BLOOD CULTURES WHEN MULTIPLE SETS ARE DRAWN IS UNCERTAIN. PLEASE NOTIFY THE MICROBIOLOGY DEPARTMENT WITHIN ONE WEEK IF SPECIATION AND SENSITIVITIES ARE REQUIRED. Performed at [facility], [place] [address], [place], [place] [address]    Report Status 02/08/2017 FINAL  Final  Blood Culture ID Panel (Reflexed)     Status: Abnormal   Collection Time: 02/05/17 11:02 PM  Result Value Ref Range Status   Enterococcus species NOT DETECTED NOT DETECTED Final   Listeria monocytogenes NOT DETECTED NOT DETECTED Final   Staphylococcus species DETECTED (A) NOT DETECTED Final    Comment: Methicillin (oxacillin) susceptible coagulase negative staphylococcus. Possible blood culture contaminant (unless isolated from more than one blood culture draw or clinical case suggests pathogenicity). No antibiotic treatment is indicated for blood  culture contaminants. CRITICAL RESULT CALLED TO, READ BACK BY AND VERIFIED WITH: B GREEN PHARMD 02/07/17 0127 JDW    Staphylococcus aureus NOT DETECTED NOT DETECTED Final   Methicillin resistance NOT DETECTED NOT DETECTED Final   Streptococcus species NOT DETECTED NOT DETECTED Final   Streptococcus agalactiae NOT DETECTED NOT DETECTED Final   Streptococcus pneumoniae NOT DETECTED NOT DETECTED Final   Streptococcus pyogenes NOT DETECTED  NOT DETECTED Final   Acinetobacter baumannii NOT DETECTED NOT DETECTED Final   Enterobacteriaceae species NOT DETECTED NOT DETECTED Final   Enterobacter cloacae complex NOT DETECTED NOT DETECTED Final   Escherichia coli NOT DETECTED NOT DETECTED Final   Klebsiella oxytoca NOT DETECTED NOT DETECTED Final   Klebsiella pneumoniae NOT DETECTED NOT DETECTED Final   Proteus species NOT DETECTED NOT DETECTED Final   Serratia marcescens NOT DETECTED NOT DETECTED Final   Carbapenem resistance NOT DETECTED NOT DETECTED Final   Haemophilus influenzae NOT DETECTED NOT DETECTED Final   Neisseria meningitidis NOT DETECTED NOT DETECTED Final   Pseudomonas aeruginosa NOT DETECTED NOT DETECTED Final   Candida albicans NOT DETECTED NOT DETECTED Final   Candida glabrata NOT DETECTED NOT DETECTED Final   Candida krusei NOT DETECTED NOT DETECTED Final   Candida parapsilosis NOT DETECTED NOT DETECTED Final   Candida tropicalis NOT DETECTED NOT DETECTED Final         Radiology Studies:       Scheduled Meds: . aspirin EC  81 mg Oral Daily  . enoxaparin (LOVENOX) injection  60 mg Subcutaneous  Q24H  . gabapentin  300 mg Oral TID   Continuous Infusions: .  ceFAZolin (ANCEF) IV Stopped (02/08/17 0702)     LOS: 3 days    Time spent: 25 mins    Kita Neace Jodie Echevaria, MD [facility] Pager [phone]  If 7PM-7AM, please contact night-coverage [email or web address] Password [email or web address] 02/08/2017, 1:12 PM

## 2017-02-08 NOTE — Evaluation (Signed)
Physical Therapy Evaluation Patient Details Name: Jo Collins MRN: [id number] DOB: October 17, 1950 Today's Date: 02/08/2017   History of Present Illness  67yo female presenting with c/o numbness in her chin and L foot. Imaging negative for DVT, fracture, and acute changes on MRI. PMH diffuse large B-cell lymphoma, PAF, intrathoracic mass, obesity, back pain, NHL, SOB   Clinical Impression   Patient received sitting at EOB, pleasant and willing to participate with skilled PT services today. She reports she was doing very well at home PTA, expresses frustration regarding health status and its impact on her life however. She is able to perform all functional bed mobility, functional transfers with Mod(I); able to gait train approximately 598f while holding IV pole with gait deviations as listed below, occasional tripping but able to self-correct with only Min guard from PT, SOB noted but patient reports this is normal for her. She was left sitting up in the chair with all needs met, questions concerns addressed this morning. Moving forward she may benefit from skilled OP PT services, however declines OP PT services this morning; nevertheless, she may benefit from at least having signed order/MD referral for future use in the event of changes in functional status.     Follow Up Recommendations Outpatient PT(patient refusing OP PT but would benefit from at least having referral/order in case she does change her mind )    Equipment Recommendations  None recommended by PT    Recommendations for Other Services       Precautions / Restrictions Precautions Precautions: Fall Restrictions Weight Bearing Restrictions: No      Mobility  Bed Mobility Overal bed mobility: Modified Independent             General bed mobility comments: increased time and effort   Transfers Overall transfer level: Modified independent Equipment used: None             General transfer comment: increased time  and effort   Ambulation/Gait Ambulation/Gait assistance: Supervision;Min guard Ambulation Distance (Feet): 500 Feet Assistive device: 1 person hand held assist(holding onto IV pole ) Gait Pattern/deviations: Step-through pattern;Decreased step length - right;Decreased step length - left;Decreased dorsiflexion - right;Decreased dorsiflexion - left;Trunk flexed     General Gait Details: toddling gait pattern, occasionally trips when walking in hallway but able to self correct without difficulty or assist past Min guard from PT; short of breath but reports this is her baseline   SFinancial traderRankin (Stroke Patients Only)       Balance Overall balance assessment: Needs assistance Sitting-balance support: No upper extremity supported;Feet supported Sitting balance-Leahy Scale: Normal     Standing balance support: During functional activity Standing balance-Leahy Scale: Good Standing balance comment: occasional tripping when walking, able to self correct with Min guard from PT                              Pertinent Vitals/Pain Pain Assessment: No/denies pain    Home Living Family/patient expects to be discharged to:: Private residence Living Arrangements: Spouse/significant other Available Help at Discharge: Family;Available 24 hours/day Type of Home: House Home Access: Level entry     Home Layout: One level Home Equipment: None      Prior Function Level of Independence: Independent         Comments: reports she is typically very mobile with no assistive device,  very busy with doctors appointments but doing well at home      Hand Dominance        Extremity/Trunk Assessment   Upper Extremity Assessment Upper Extremity Assessment: Overall WFL for tasks assessed    Lower Extremity Assessment Lower Extremity Assessment: Overall WFL for tasks assessed    Cervical / Trunk Assessment Cervical / Trunk  Assessment: Normal  Communication   Communication: No difficulties  Cognition Arousal/Alertness: Awake/alert Behavior During Therapy: WFL for tasks assessed/performed Overall Cognitive Status: Within Functional Limits for tasks assessed                                        General Comments General comments (skin integrity, edema, etc.): patient refusing HHPT or OP PT right now     Exercises     Assessment/Plan    PT Assessment Patient needs continued PT services  PT Problem List Decreased mobility;Decreased safety awareness;Decreased coordination;Decreased balance;Decreased activity tolerance       PT Treatment Interventions DME instruction;Therapeutic activities;Gait training;Therapeutic exercise;Patient/family education;Stair training;Balance training;Functional mobility training;Neuromuscular re-education    PT Goals (Current goals can be found in the Care Plan section)  Acute Rehab PT Goals Patient Stated Goal: to go home/feel better  PT Goal Formulation: With patient Time For Goal Achievement: 02/22/17 Potential to Achieve Goals: Good    Frequency Min 3X/week   Barriers to discharge        Co-evaluation               AM-PAC PT "6 Clicks" Daily Activity  Outcome Measure Difficulty turning over in bed (including adjusting bedclothes, sheets and blankets)?: None Difficulty moving from lying on back to sitting on the side of the bed? : None Difficulty sitting down on and standing up from a chair with arms (e.g., wheelchair, bedside commode, etc,.)?: None Help needed moving to and from a bed to chair (including a wheelchair)?: None Help needed walking in hospital room?: A Little Help needed climbing 3-5 steps with a railing? : A Little 6 Click Score: 22    End of Session   Activity Tolerance: Patient tolerated treatment well Patient left: in chair;with call bell/phone within reach   PT Visit Diagnosis: Unsteadiness on feet  (R26.81);Difficulty in walking, not elsewhere classified (R26.2)    Time: 4128-7867 PT Time Calculation (min) (ACUTE ONLY): 23 min   Charges:   PT Evaluation $PT Eval Low Complexity: 1 Low PT Treatments $Gait Training: 8-22 mins   PT G Codes:        Deniece Ree PT, DPT, CBIS  Supplemental Physical Therapist [place]   Pager [phone]

## 2017-02-08 NOTE — Progress Notes (Signed)
[name]  HEMATOLOGY ONCOLOGY INPATIENT PROGRESS NOTE  Date of service: 02/07/2017 Patient Care Team: Deland Pretty, MD as PCP - General (Internal Medicine)  CC: post hospitalization f/u for management of newly diagnosed diffuse large B cell lymphoma  Diagnosis: Diffuse large B cell lymphoma  Current Treatment: R-CHOP with G-CSF support s/p R-CHOP x 6 cycles.  HISTORY OF PRESENT ILLNESS: please see original clinic note for HPI  INTERVAL HISTORY:  Jo Collins was seen in the hospital.  She is admitted for leukocytosis elevated LDH and symptoms of chin numbness as well as new back pain and left lower extremity discomfort.  She has had a septic workup noted to have coag negative staph in the blood and is currently on IV Ancef. CT chest abdomen pelvis shows new chest wall mass and increasing pelvic lymphadenopathy concerning for progression of her relapse/refractory diffuse large B-cell lymphoma. She also appears to be becoming anemic with rapidly increasing LDH levels. MRI of the brain did not show any overt brain lesions or brainstem pathology to explain her chin numbness which could potentially be paraneoplastic. I connected with Dr. Clint Bolder at [facility] and per his initial recommendations we did give her an urgent referral to see Dr. Raylene Everts to consider car T-cell therapy. Ultrasound of the lower extremity showed no DVT in her left lower extremity. With her consent and MRI of the T-spine has been ordered. No fevers chills at this time but did have a fever of 102.9 at nighttime. Had a detailed discussion with her regarding all the results and with her family on the phone.   REVIEW OF SYSTEMS:    A 10+ POINT REVIEW OF SYSTEMS WAS OBTAINED including neurology, dermatology, psychiatry, cardiac, respiratory, lymph, extremities, GI, GU, Musculoskeletal, constitutional, breasts, reproductive, HEENT.  All pertinent positives are noted in the HPI.  All others are negative.  . Past Medical  History:  Diagnosis Date  . Abdominal pain, RUQ   . Abnormal EKG 08/07/2016  . Acute midline thoracic back pain   . Acute respiratory distress 09/25/2016  . Anemia 08/07/2016  . Chronic low back pain 09/25/2016  . Cough   . DLBCL (diffuse large B cell lymphoma) ([place]) 08/17/2016  . GERD (gastroesophageal reflux disease)   . History of chemotherapy   . Hypokalemia 09/25/2016  . 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 Lymphadenopathy   . Morbid obesity with BMI of 50.0-59.9, adult ([place])   . Neutrophilic leukocytosis 61/09/5091  . NHL (non-Hodgkin's lymphoma) ([place])   . Pleural effusion on right 08/07/2016  . Recurrent pleural effusion on right 10/08/2016  . Recurrent right pleural effusion 09/25/2016  . S/P thoracentesis   . Shortness of breath 08/07/2016  . SOB (shortness of breath) 09/26/2016    Past Surgical History:  Procedure Laterality Date  . ABDOMINAL HYSTERECTOMY    . IR CV LINE INJECTION  10/26/2016  . IR FLUORO GUIDE PORT INSERTION RIGHT  08/18/2016  . IR FLUORO GUIDE PORT INSERTION RIGHT  11/03/2016  . IR REMOVAL TUN CV CATH W/O FL  11/03/2016  . IR THORACENTESIS ASP PLEURAL SPACE W/IMG GUIDE  08/07/2016  . IR THORACENTESIS ASP PLEURAL SPACE W/IMG GUIDE  08/14/2016  . IR US GUIDE VASC ACCESS RIGHT  08/18/2016  . IR US GUIDE VASC ACCESS RIGHT  11/03/2016    . Social History   Tobacco Use  . Smoking status: Former Smoker    Packs/day: 0.50    Years: 47.00    Pack years: 23.50    Types: Cigarettes  Last attempt to quit: 07/28/2016    Years since quitting: 0.5  . Smokeless tobacco: Never Used  Substance Use Topics  . Alcohol use: No  . Drug use: No    ALLERGIES:  has No Known Allergies.  MEDICATIONS:  Current Facility-Administered Medications  Medication Dose Route Frequency Provider Last Rate Last Dose  . acetaminophen (TYLENOL) tablet 650 mg  650 mg Oral Q4H PRN Schorr, Rhetta Mura, NP   650 mg at 02/08/17 0330  . aspirin EC tablet 81 mg  81 mg Oral Daily Emokpae, Ejiroghene E, MD    81 mg at 02/07/17 0847  . ceFAZolin (ANCEF) IVPB 2g/100 mL premix  2 g Intravenous Q8H Schorr, Rhetta Mura, NP   Stopped at 02/08/17 [phone]  . enoxaparin (LOVENOX) injection 60 mg  60 mg Subcutaneous Q24H Emokpae, Ejiroghene E, MD   60 mg at 02/07/17 2045  . gabapentin (NEURONTIN) capsule 300 mg  300 mg Oral TID Marene Lenz, MD   300 mg at 02/07/17 2044  . morphine 2 MG/ML injection 2 mg  2 mg Intravenous Once Emokpae, Ejiroghene E, MD      . oxyCODONE-acetaminophen (PERCOCET/ROXICET) 5-325 MG per tablet 1 tablet  1 tablet Oral Q6H PRN Emokpae, Ejiroghene E, MD   1 tablet at 02/08/17 0244    PHYSICAL EXAMINATION: ECOG PERFORMANCE STATUS: 1 - Symptomatic but completely ambulatory  . Vitals:   02/07/17 2113 02/08/17 0426  BP: 138/67 (!) 100/58  Pulse: 95 81  Resp: 16 16  Temp: (!) 102.9 F (39.4 C) 99.5 F (37.5 C)  SpO2: 91% 95%    Filed Weights   02/08/17 0244  Weight: 253 lb 8.5 oz (115 kg)   .Body mass index is 47.9 kg/m.  GENERAL:alert, in no acute distress and comfortable SKIN: no acute rashes, no significant lesions EYES: conjunctiva are pink and non-injected, sclera anicteric OROPHARYNX: MMM, no exudates, no oropharyngeal erythema or ulceration NECK: supple, no JVD LYMPH:  no palpable lymphadenopathy in the cervical, axillary or inguinal regions LUNGS: distant breath sounds. Decreased AE rt base about 1/4 up posterior chest wall. HEART: S1/S2 irregular rate & rhythm ABDOMEN:  normoactive bowel sounds , non tender, not distended. Extremity: grade 1 pedal edema PSYCH: alert & oriented x 3 with fluent speech NEURO: no focal motor/sensory deficits   LABORATORY DATA:   I have reviewed the data as listed  . CBC Latest Ref Rng & Units 02/08/2017 02/07/2017 02/06/2017  WBC 4.0 - 10.5 K/uL 17.7(H) 22.9(H) 21.4(H)  Hemoglobin 12.0 - 15.0 g/dL 8.8(L) 8.3(L) 9.3(L)  Hematocrit 36.0 - 46.0 % 28.3(L) 26.4(L) 29.7(L)  Platelets 150 - 400 K/uL 91(L) 153 185    . CMP  Latest Ref Rng & Units 02/07/2017 02/06/2017 02/05/2017  Glucose 65 - 99 mg/dL 106(H) 100(H) 94  BUN 6 - 20 mg/dL 13 12 12   Creatinine 0.44 - 1.00 mg/dL 0.93 0.87 1.02  Sodium 135 - 145 mmol/L 135 138 140  Potassium 3.5 - 5.1 mmol/L 3.5 3.0(L) 3.5  Chloride 101 - 111 mmol/L 103 104 105  CO2 22 - 32 mmol/L 24 24 22   Calcium 8.9 - 10.3 mg/dL 8.6(L) 8.7(L) 9.4  Total Protein 6.4 - 8.3 g/dL - - 6.8  Total Bilirubin 0.2 - 1.2 mg/dL - - 0.5  Alkaline Phos 40 - 150 U/L - - 63  AST 5 - 34 U/L - - 37(H)  ALT 0 - 55 U/L - - 14   LDH 1900's   RADIOGRAPHIC STUDIES: I have personally  reviewed the radiological images as listed and agreed with the findings in the report. Dg Tibia/fibula Left  Result Date: 02/06/2017 CLINICAL DATA:  Leg pain.  History of lymphoma EXAM: LEFT TIBIA AND FIBULA - 2 VIEW COMPARISON:  None. FINDINGS: There is no evidence of fracture or other focal bone lesions. Soft tissues are unremarkable. IMPRESSION: Negative. Electronically Signed   By: Franchot Gallo M.D.   On: 02/06/2017 08:48   Ct Chest W Contrast  Result Date: 02/06/2017 CLINICAL DATA:  H/o diffuse large B cell lymphoma dx'd 9/18, last chemo 12/18 Lt groin, leg, foot pain & numbness since 1/19 Recurrent rt pleural effusion EXAM: CT CHEST, ABDOMEN, AND PELVIS WITH CONTRAST TECHNIQUE: Multidetector CT imaging of the chest, abdomen and pelvis was performed following the standard protocol during bolus administration of intravenous contrast. CONTRAST:  100 mL of Isovue-300 intravenous contrast COMPARISON:  PET-CT, 01/05/2017 and 10/21/2016. FINDINGS: CT CHEST FINDINGS Cardiovascular: Heart is normal in size. No pericardial effusion. No significant coronary artery calcifications. Aorta is normal in caliber. No aortic dissection or atherosclerosis. Mild prominence of the main pulmonary artery measuring 3.5 cm. Mediastinum/Nodes: Ill-defined low-attenuation soft tissue lies adjacent to the lower thoracic spine vertebra and contacts the  descending thoracic aorta, without convincing change most recent PET-CT. No discrete mediastinal or hilar masses and no pathologically enlarged lymph nodes. Trachea is widely patent. Esophagus is unremarkable. No neck base or axillary masses or adenopathy. Lungs/Pleura: Small right pleural effusion. Opacity the anterolateral right upper lobe and in the right middle lobe, consistent with atelectasis, is stable from the prior PET-CT. Some pleural based opacity along the anterior right upper lobe more superiorly has mildly improved. Mild pleural base parenchymal opacity is noted in the right lower lobe, most likely atelectasis, mildly improved from the PET-CT. Left lung is clear. No left pleural effusion. No pneumothorax. Musculoskeletal: There is thickening of the right anterior inferior chest wall musculature extending superiorly from the right sixth rib costal cartilage to the right eighth rib costal cartilage. Adjacent to this, at the level of the eighth rib costal cartilage, there is a 14 mm irregular soft tissue nodule. These findings are new from the recent PET-CT. The muscular thickening measures 2.3 cm, anterior to posterior. No osteoblastic or osteolytic lesions. No fracture or acute finding. CT ABDOMEN PELVIS FINDINGS Hepatobiliary: Liver mildly enlarged, right lobe measuring 2.4 cm from superior inferior. No liver mass or focal lesion. There is irregular wall thickening of the gallbladder most evident along the fundus focally. No evidence of gallstones. No pericholecystic inflammation. No bile duct dilation. Pancreas: Unremarkable. No pancreatic ductal dilatation or surrounding inflammatory changes. Spleen: Spleen borderline enlarged measuring 11.5 x 5.5 x 11.8 cm, without significant change when compared to the most recent prior PET-CT. No splenic mass or focal lesion. Adrenals/Urinary Tract: No adrenal masses. Subcentimeter lower pole cyst arises from the left kidney. No other renal masses, no stones and  no hydronephrosis. Symmetric renal enhancement and excretion. Normal ureters. Bladder is unremarkable. Stomach/Bowel: No stomach, small bowel or colonic mass. No mesenteric masses. Bowel is normal in caliber. Stomach and small bowel are unremarkable. There are multiple left colon diverticula. No diverticulitis or other colonic inflammatory process. Normal appendix is visualized. Vascular/Lymphatic: Enlarged obturator node on the left surrounds a left internal iliac artery branch. This measures 3.8 x 3.6 cm, increased from 2.7 x 2.0 cm on the prior PET-CT. There is an adjacent left external iliac chain lymph node measuring 17 mm in short axis, stable from the PET-CT.  There are 2 adjacent right external iliac chain lymph nodes, largest measuring 2.8 x 1.8 cm, also without change. No other enlarged lymph nodes. Aorta is normal in caliber. No atherosclerosis. No significant vascular abnormality. Reproductive: Status post hysterectomy. No adnexal masses. Other: No abdominal wall hernia. There is a focus of increased attenuation in the subcutaneous fat of the right upper abdomen wall which may be from a soft tissue injection. This does not appear to be a discrete mass. Calcified soft tissue granuloma are noted in the left lateral upper chest wall subcutaneous fat. No ascites. Musculoskeletal: No osteoblastic or osteolytic lesions. IMPRESSION: 1. There is a new chest wall mass involving the musculature of the right, anterior inferior chest wall, spanning from approximately the right sixth through the right eighth rib cartilages. There is a new adjacent subcutaneous soft tissue mass measuring 14 mm. Findings are consistent with new areas of soft tissue lymphoma. 2. In the pelvis, there is enlargement of a left obturator lymph node when compared to the recent PET-CT, consistent with progression lymphoma. 3. Previously described soft tissue along the descending thoracic aorta is without significant change from the most recent  prior CT. 4. Small right pleural effusion and areas right lung pleural based opacity are similar to the PET-CT, with subtle areas of improvement of some of the pleural-based right lung opacity. 5. There is mild irregular wall thickening noted along the gallbladder mostly at the fundus. This could be a benign etiology, specifically adenomyomatosis. Infiltration of the gallbladder wall with lymphoma is possible. Consider further assessment with limited right upper quadrant ultrasound. 6. Mild hepatomegaly and borderline enlargement of the spleen is stable from the recent PET-CT. Electronically Signed   By: Lajean Manes M.D.   On: 02/06/2017 14:28   Mr Brain Wo Contrast  Result Date: 02/06/2017 CLINICAL DATA:  Numbness or tingling, paresthesia. Numbness involves the chin and left lower extremity. History of non-Hodgkin's lymphoma. EXAM: MRI HEAD WITHOUT CONTRAST TECHNIQUE: Multiplanar, multiecho pulse sequences of the brain and surrounding structures were obtained without intravenous contrast. COMPARISON:  None. FINDINGS: Brain: Incomplete study with diffusion, sagittal T1, axial FLAIR, axial T2, and axial gradient acquired. No acute infarct, blood products, hydrocephalus, or masslike finding. There is FLAIR hyperintensity about the atria of the lateral ventricles where there is mild apparent ependymal bulging towards the ventricles. This is likely due to slice angle given the symmetric appearance and lack of restricted diffusion in this area. These signal changes are usually seen with chronic small vessel ischemia. No suspected mass. Partially empty sella Vascular: Major flow voids are preserved. Skull and upper cervical spine: No evidence of marrow lesion. Sinuses/Orbits: Negative IMPRESSION: 1. Partial, noncontrast brain MRI due to patient discomfort. 2. No acute finding or explanation for symptoms. Electronically Signed   By: Monte Fantasia M.D.   On: 02/06/2017 13:18   Ct Abdomen Pelvis W Contrast  Result  Date: 02/06/2017 CLINICAL DATA:  H/o diffuse large B cell lymphoma dx'd 9/18, last chemo 12/18 Lt groin, leg, foot pain & numbness since 1/19 Recurrent rt pleural effusion EXAM: CT CHEST, ABDOMEN, AND PELVIS WITH CONTRAST TECHNIQUE: Multidetector CT imaging of the chest, abdomen and pelvis was performed following the standard protocol during bolus administration of intravenous contrast. CONTRAST:  100 mL of Isovue-300 intravenous contrast COMPARISON:  PET-CT, 01/05/2017 and 10/21/2016. FINDINGS: CT CHEST FINDINGS Cardiovascular: Heart is normal in size. No pericardial effusion. No significant coronary artery calcifications. Aorta is normal in caliber. No aortic dissection or atherosclerosis. Mild prominence of  the main pulmonary artery measuring 3.5 cm. Mediastinum/Nodes: Ill-defined low-attenuation soft tissue lies adjacent to the lower thoracic spine vertebra and contacts the descending thoracic aorta, without convincing change most recent PET-CT. No discrete mediastinal or hilar masses and no pathologically enlarged lymph nodes. Trachea is widely patent. Esophagus is unremarkable. No neck base or axillary masses or adenopathy. Lungs/Pleura: Small right pleural effusion. Opacity the anterolateral right upper lobe and in the right middle lobe, consistent with atelectasis, is stable from the prior PET-CT. Some pleural based opacity along the anterior right upper lobe more superiorly has mildly improved. Mild pleural base parenchymal opacity is noted in the right lower lobe, most likely atelectasis, mildly improved from the PET-CT. Left lung is clear. No left pleural effusion. No pneumothorax. Musculoskeletal: There is thickening of the right anterior inferior chest wall musculature extending superiorly from the right sixth rib costal cartilage to the right eighth rib costal cartilage. Adjacent to this, at the level of the eighth rib costal cartilage, there is a 14 mm irregular soft tissue nodule. These findings are  new from the recent PET-CT. The muscular thickening measures 2.3 cm, anterior to posterior. No osteoblastic or osteolytic lesions. No fracture or acute finding. CT ABDOMEN PELVIS FINDINGS Hepatobiliary: Liver mildly enlarged, right lobe measuring 2.4 cm from superior inferior. No liver mass or focal lesion. There is irregular wall thickening of the gallbladder most evident along the fundus focally. No evidence of gallstones. No pericholecystic inflammation. No bile duct dilation. Pancreas: Unremarkable. No pancreatic ductal dilatation or surrounding inflammatory changes. Spleen: Spleen borderline enlarged measuring 11.5 x 5.5 x 11.8 cm, without significant change when compared to the most recent prior PET-CT. No splenic mass or focal lesion. Adrenals/Urinary Tract: No adrenal masses. Subcentimeter lower pole cyst arises from the left kidney. No other renal masses, no stones and no hydronephrosis. Symmetric renal enhancement and excretion. Normal ureters. Bladder is unremarkable. Stomach/Bowel: No stomach, small bowel or colonic mass. No mesenteric masses. Bowel is normal in caliber. Stomach and small bowel are unremarkable. There are multiple left colon diverticula. No diverticulitis or other colonic inflammatory process. Normal appendix is visualized. Vascular/Lymphatic: Enlarged obturator node on the left surrounds a left internal iliac artery branch. This measures 3.8 x 3.6 cm, increased from 2.7 x 2.0 cm on the prior PET-CT. There is an adjacent left external iliac chain lymph node measuring 17 mm in short axis, stable from the PET-CT. There are 2 adjacent right external iliac chain lymph nodes, largest measuring 2.8 x 1.8 cm, also without change. No other enlarged lymph nodes. Aorta is normal in caliber. No atherosclerosis. No significant vascular abnormality. Reproductive: Status post hysterectomy. No adnexal masses. Other: No abdominal wall hernia. There is a focus of increased attenuation in the subcutaneous  fat of the right upper abdomen wall which may be from a soft tissue injection. This does not appear to be a discrete mass. Calcified soft tissue granuloma are noted in the left lateral upper chest wall subcutaneous fat. No ascites. Musculoskeletal: No osteoblastic or osteolytic lesions. IMPRESSION: 1. There is a new chest wall mass involving the musculature of the right, anterior inferior chest wall, spanning from approximately the right sixth through the right eighth rib cartilages. There is a new adjacent subcutaneous soft tissue mass measuring 14 mm. Findings are consistent with new areas of soft tissue lymphoma. 2. In the pelvis, there is enlargement of a left obturator lymph node when compared to the recent PET-CT, consistent with progression lymphoma. 3. Previously described soft tissue  along the descending thoracic aorta is without significant change from the most recent prior CT. 4. Small right pleural effusion and areas right lung pleural based opacity are similar to the PET-CT, with subtle areas of improvement of some of the pleural-based right lung opacity. 5. There is mild irregular wall thickening noted along the gallbladder mostly at the fundus. This could be a benign etiology, specifically adenomyomatosis. Infiltration of the gallbladder wall with lymphoma is possible. Consider further assessment with limited right upper quadrant ultrasound. 6. Mild hepatomegaly and borderline enlargement of the spleen is stable from the recent PET-CT. Electronically Signed   By: Lajean Manes M.D.   On: 02/06/2017 14:28   Ct Biopsy  Result Date: 01/23/2017 INDICATION: History of diffuse large B-cell lymphoma now with concern for recurrence. Please perform CT-guided biopsy of hypermetabolic right external iliac chain lymph node for tissue diagnostic purposes. EXAM: CT-GUIDED BIOPSY OF RIGHT EXTERNAL ILIAC CHAIN LYMPH NODE COMPARISON:  PET-CT - 01/05/2017 MEDICATIONS: None. ANESTHESIA/SEDATION: Fentanyl 100 mcg IV;  Versed 2 mg IV Sedation time: 16 minutes; The patient was continuously monitored during the procedure by the interventional radiology nurse under my direct supervision. CONTRAST:  None. COMPLICATIONS: None immediate. PROCEDURE: Informed consent was obtained from the patient following an explanation of the procedure, risks, benefits and alternatives. A time out was performed prior to the initiation of the procedure. The patient was positioned supine on the CT table and a limited CT was performed for procedural planning demonstrating unchanged size and appearance of known hypermetabolic right external iliac chain nodal conglomeration with dominant component measuring approximately 3.9 x 1.9 cm (image 18, series 3. The procedure was planned. The operative site was prepped and draped in the usual sterile fashion. Appropriate trajectory was confirmed with a 22 gauge spinal needle after the adjacent tissues were anesthetized with 1% Lidocaine with epinephrine. Under intermittent CT guidance, a 17 gauge coaxial needle was advanced into the peripheral aspect of the nodal conglomeration. Appropriate positioning was confirmed and 5 core needle biopsy samples were obtained with an 18 gauge core needle biopsy device. The co-axial needle was removed and hemostasis was achieved with manual compression. A limited postprocedural CT was negative for hemorrhage or additional complication. A dressing was placed. The patient tolerated the procedure well without immediate postprocedural complication. IMPRESSION: Technically successful CT guided core needle biopsy of hypermetabolic right external iliac chain nodal conglomeration. Electronically Signed   By: Sandi Mariscal M.D.   On: 01/23/2017 11:41   Vas [place] Lower Extremity Venous (dvt)  Result Date: 02/06/2017  Lower Venous Study Indication: Pain. Examination Guidelines: A complete evaluation includes B-mode imaging, spectral doppler, color doppler, and power doppler as needed of all  accessible portions of each vessel. Bilateral testing is considered an integral part of a complete examination. Limited examinations for reoccurring indications may be performed as noted. The reflux portion of the exam is performed with the patient in reverse Trendelenburg.  Limitations: Body habitus.  Right Venous Findings: +---+---------------+---------+-----------+----------+-------+    CompressibilityPhasicitySpontaneityPropertiesSummary +---+---------------+---------+-----------+----------+-------+ CFVFull           Yes      Yes                          +---+---------------+---------+-----------+----------+-------+  Left Venous Findings: +---------+---------------+---------+-----------+----------+-------+          CompressibilityPhasicitySpontaneityPropertiesSummary +---------+---------------+---------+-----------+----------+-------+ CFV      Full           Yes      Yes                          +---------+---------------+---------+-----------+----------+-------+  FV Prox  Full                                                 +---------+---------------+---------+-----------+----------+-------+ FV Mid   Full                                                 +---------+---------------+---------+-----------+----------+-------+ FV DistalFull                                                 +---------+---------------+---------+-----------+----------+-------+ PFV      Full                                                 +---------+---------------+---------+-----------+----------+-------+ POP      Full           Yes      Yes                          +---------+---------------+---------+-----------+----------+-------+ PTV      Full                                                 +---------+---------------+---------+-----------+----------+-------+ PERO     Full                                                  +---------+---------------+---------+-----------+----------+-------+    Final Interpretation: Right: No evidence of common femoral vein obstruction. Left: There is no evidence of deep vein thrombosis in the lower extremity.There is no evidence of superficial venous thrombosis. No cystic structure found in the popliteal fossa.  *See table(s) above for measurements and observations. Electronically signed by Harold Barban on 02/06/2017 at 10:36:16 AM.   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  -PET scan skull base to thigh on 10/21/2016 with results revealing: IMPRESSION:1. There has been significant interval improvement in the chest, abdomen, and pelvis in the interval. The dominant mass in the chest is much smaller and less FDG avid in the interval. However, the remaining maximum uptake is slightly greater than mediastinal blood pool. The dominant remaining disease in the pelvis is an external iliac lymph node with a maximum SUV moderately greater than liver blood pool. Evaluation of the bones is more difficult today  given the diffuse uptake likely associated with recent chemotherapy. I suspect the previously identified humeral lesion remains. Recommend continued attention to the bones on follow-up. [place] Category 4  -PET scan skull base to thigh on 01/05/2017 r after 6 cycles of R-CHOP and IT MTX revealing: IMPRESSION:1. Mixed findings on the PET-CT. There are definite areas of significant continued improvement but also some new areas of involvement and slightly progressive pelvic  adenopathy as detailed Above. 2. Near complete resolution of the right pleural effusion. 3. Much improved osseous disease.  #3 Coag neg Staph Sepsis with borderline elevated procalcitonin levels.  Plan -I had a detailed discussion with the patient about her labs and findings on her CT chest abdomen pelvis concerning for lymphoma progression. -MRI brain did not show any overt brain involvement of lymphoma. -If neurological symptoms were to worsen patient might need a lumbar puncture. -On antibiotics -cefazolin as per hospitalist for coag negative staph sepsis.  No evidence of overt abscess.  Repeat blood cultures pending including cultures recommended from port. -ID consultation as needed. -Once infectious issues are stabilized will need to consider salvage chemotherapy with R-ICE  -Has been given urgent referral to [facility] to see Dr. Raylene Everts to consider salvage car T-cell therapy/clinical trial with cellular therapies. -I discussed in detail with the patient the concerning nature of her current presentation and the fact that this is going to need pretty involved treatments. -Her family was also involved in the discussion at her behest. -Appreciate excellent care by hospitalist team.  #4 lower back pain radiating to left lower extremity -We will get MRI lumbar spine to rule out any spinal metastases or significant disc disease since this significantly limiting the patient's functional status.  #5 Paroxysmal atrial fibrillation - last EKG in 10/2016 - NSR Plan -f/u  with PCP/Cardiology   Patient Active Problem List   Diagnosis Date Noted  . Positive blood culture 02/07/2017  . Numbness 02/05/2017  . PAF (paroxysmal atrial fibrillation) ([place]) 01/08/2017  . LVH (left ventricular hypertrophy) 01/08/2017  . S/P thoracentesis   . Cough   . Neutrophilic leukocytosis 96/22/2979  . Recurrent pleural effusion on right 10/08/2016  . SOB (shortness of breath) 09/26/2016  . Acute  respiratory distress 09/25/2016  . Chronic low back pain 09/25/2016  . Hypokalemia 09/25/2016  . Recurrent right pleural effusion 09/25/2016  . DLBCL (diffuse large B cell lymphoma) ([place]) 08/17/2016  . Acute midline thoracic back pain   . Lymphadenopathy   . Morbid obesity with BMI of 50.0-59.9, adult ([place])   . NHL (non-Hodgkin's lymphoma) ([place])   . Abdominal pain, RUQ   . Pleural effusion on right 08/07/2016  . Abnormal EKG 08/07/2016  . 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 Anemia 08/07/2016  . GERD (gastroesophageal reflux disease) 08/07/2016  . Shortness of breath 08/07/2016     I spent 30 minutes counseling the patient face to face. The total time spent in the appointment was 35 minutes and more than 50% was on counseling and direct patient cares.    Sullivan Lone MD [place] AAHIVMS [facility] Hematology/Oncology Physician [facility]  (Office):       [phone] (Work cell):  [phone] (Fax):           [phone]

## 2017-02-08 NOTE — Progress Notes (Signed)
DISCONTINUE ON PATHWAY REGIMEN - Lymphoma and CLL     A cycle is every 21 days:     Rituximab      Cyclophosphamide      Doxorubicin      Vincristine      Prednisone   **Always confirm dose/schedule in your pharmacy ordering system**    REASON: Disease Progression PRIOR TREATMENT: [id number]: R(IV)-CHOP q21 Days x 6 Cycles TREATMENT RESPONSE: Partial Response (PR)  START ON PATHWAY REGIMEN - Lymphoma and CLL     A cycle is every 21 days:     Rituximab      Etoposide      Carboplatin      Ifosfamide      [place]      Pegfilgrastim-xxxx   **Always confirm dose/schedule in your pharmacy ordering system**    Patient Characteristics: Diffuse Large B-Cell Lymphoma, Relapsed / Refractory, All Stages,  Second Line, Transplant Candidate Disease Type: Not Applicable Disease Type: Diffuse Large B-Cell Lymphoma Disease Type: Not Applicable Line of therapy: Relapsed / Refractory - Second Line [place] Stage: III Patient Characteristics: Transplant Candidate Intent of Therapy: Curative Intent, Discussed with Patient

## 2017-02-08 NOTE — Care Management Important Message (Signed)
Important Message  Patient Details  Name: Jo Collins MRN: [id number] Date of Birth: 18-Jan-1950   Medicare Important Message Given:  Yes    Kerin Salen 02/08/2017, 11:11 AMImportant Message  Patient Details  Name: Jo Collins MRN: [id number] Date of Birth: 1950/04/25   Medicare Important Message Given:  Yes    Kerin Salen 02/08/2017, 11:11 AM

## 2017-02-09 ENCOUNTER — Inpatient Hospital Stay (HOSPITAL_COMMUNITY): Payer: Medicare Other

## 2017-02-09 ENCOUNTER — Other Ambulatory Visit: Payer: Self-pay | Admitting: Hematology

## 2017-02-09 ENCOUNTER — Encounter: Payer: Self-pay | Admitting: *Deleted

## 2017-02-09 ENCOUNTER — Encounter (HOSPITAL_COMMUNITY): Payer: Self-pay | Admitting: Radiology

## 2017-02-09 DIAGNOSIS — M5442 Lumbago with sciatica, left side: Secondary | ICD-10-CM

## 2017-02-09 DIAGNOSIS — D696 Thrombocytopenia, unspecified: Secondary | ICD-10-CM

## 2017-02-09 DIAGNOSIS — R200 Anesthesia of skin: Secondary | ICD-10-CM

## 2017-02-09 LAB — PROCALCITONIN: PROCALCITONIN: 1.87 ng/mL

## 2017-02-09 LAB — URINALYSIS, ROUTINE W REFLEX MICROSCOPIC
BILIRUBIN URINE: NEGATIVE
GLUCOSE, UA: NEGATIVE mg/dL
Ketones, ur: 5 mg/dL — AB
LEUKOCYTES UA: NEGATIVE
NITRITE: NEGATIVE
Protein, ur: 100 mg/dL — AB
SPECIFIC GRAVITY, URINE: 1.038 — AB (ref 1.005–1.030)
pH: 5 (ref 5.0–8.0)

## 2017-02-09 LAB — CBC WITH DIFFERENTIAL/PLATELET
Band Neutrophils: 10 %
Basophils Absolute: 0 10*3/uL (ref 0.0–0.1)
Basophils Relative: 0 %
EOS ABS: 0.5 10*3/uL (ref 0.0–0.7)
Eosinophils Relative: 3 %
HCT: 27.3 % — ABNORMAL LOW (ref 36.0–46.0)
Hemoglobin: 8.7 g/dL — ABNORMAL LOW (ref 12.0–15.0)
LYMPHS ABS: 1.4 10*3/uL (ref 0.7–4.0)
LYMPHS PCT: 9 %
MCH: 27.1 pg (ref 26.0–34.0)
MCHC: 31.9 g/dL (ref 30.0–36.0)
MCV: 85 fL (ref 78.0–100.0)
MONO ABS: 2.3 10*3/uL — AB (ref 0.1–1.0)
MONOS PCT: 15 %
Metamyelocytes Relative: 2 %
Myelocytes: 7 %
NEUTROS ABS: 11.3 10*3/uL — AB (ref 1.7–7.7)
NEUTROS PCT: 54 %
PLATELETS: 68 10*3/uL — AB (ref 150–400)
RBC: 3.21 MIL/uL — AB (ref 3.87–5.11)
RDW: 16.2 % — AB (ref 11.5–15.5)
WBC: 15.5 10*3/uL — ABNORMAL HIGH (ref 4.0–10.5)

## 2017-02-09 MED ORDER — FENTANYL CITRATE (PF) 100 MCG/2ML IJ SOLN
INTRAMUSCULAR | Status: AC
  Filled 2017-02-09: qty 2

## 2017-02-09 MED ORDER — SODIUM CHLORIDE 0.9 % IV SOLN
INTRAVENOUS | Status: AC
  Administered 2017-02-09: 250 mL
  Filled 2017-02-09: qty 250

## 2017-02-09 MED ORDER — FENTANYL CITRATE (PF) 100 MCG/2ML IJ SOLN
INTRAMUSCULAR | Status: AC | PRN
  Administered 2017-02-09 (×2): 50 ug via INTRAVENOUS

## 2017-02-09 MED ORDER — GABAPENTIN 300 MG PO CAPS
300.0000 mg | ORAL_CAPSULE | Freq: Three times a day (TID) | ORAL | Status: DC
  Administered 2017-02-09 (×2): 300 mg via ORAL
  Filled 2017-02-09 (×2): qty 1

## 2017-02-09 MED ORDER — DEXAMETHASONE SODIUM PHOSPHATE 4 MG/ML IJ SOLN
8.0000 mg | INTRAMUSCULAR | Status: DC
  Administered 2017-02-09 – 2017-02-10 (×2): 8 mg via INTRAVENOUS
  Filled 2017-02-09 (×2): qty 2

## 2017-02-09 MED ORDER — LIDOCAINE HCL (PF) 1 % IJ SOLN
INTRAMUSCULAR | Status: AC | PRN
  Administered 2017-02-09: 30 mL

## 2017-02-09 MED ORDER — DILTIAZEM HCL 100 MG IV SOLR
5.0000 mg/h | INTRAVENOUS | Status: DC
  Administered 2017-02-09: 5 mg/h via INTRAVENOUS
  Filled 2017-02-09: qty 100

## 2017-02-09 MED ORDER — MIDAZOLAM HCL 2 MG/2ML IJ SOLN
INTRAMUSCULAR | Status: AC
  Filled 2017-02-09: qty 2

## 2017-02-09 MED ORDER — CEPHALEXIN 500 MG PO CAPS
500.0000 mg | ORAL_CAPSULE | Freq: Four times a day (QID) | ORAL | Status: DC
  Administered 2017-02-09 – 2017-02-10 (×5): 500 mg via ORAL
  Filled 2017-02-09 (×5): qty 1

## 2017-02-09 MED ORDER — MIDAZOLAM HCL 2 MG/2ML IJ SOLN
INTRAMUSCULAR | Status: AC | PRN
  Administered 2017-02-09 (×2): 1 mg via INTRAVENOUS

## 2017-02-09 MED ORDER — DILTIAZEM LOAD VIA INFUSION
10.0000 mg | Freq: Once | INTRAVENOUS | Status: AC
  Administered 2017-02-09: 10 mg via INTRAVENOUS
  Filled 2017-02-09: qty 10

## 2017-02-09 NOTE — Progress Notes (Signed)
DISCONTINUE ON PATHWAY REGIMEN - Lymphoma and CLL     A cycle is every 21 days:     Rituximab      Etoposide      Carboplatin      Ifosfamide      [place]      Pegfilgrastim-xxxx   **Always confirm dose/schedule in your pharmacy ordering system**    REASON: Toxicities / Adverse Event PRIOR TREATMENT: [id number]: R-ICE (CIV Ifosfamide) q21 Days (Inpatient) TREATMENT RESPONSE: Unable to Evaluate  START ON PATHWAY REGIMEN - Lymphoma and CLL     A cycle is every 21 days:     Dexamethasone      Gemcitabine      Cisplatin      Rituximab   **Always confirm dose/schedule in your pharmacy ordering system**    Patient Characteristics: Diffuse Large B-Cell Lymphoma, Relapsed / Refractory, All Stages,  Second Line, Transplant Candidate Disease Type: Not Applicable Disease Type: Diffuse Large B-Cell Lymphoma Disease Type: Not Applicable Line of therapy: Relapsed / Refractory - Second Line [place] Stage: III Patient Characteristics: Transplant Candidate Intent of Therapy: Curative Intent, Discussed with Patient

## 2017-02-09 NOTE — Progress Notes (Signed)
Referring Physician(s): [place]  Supervising Physician: Jacqulynn Cadet  Patient Status:  [facility] - In-pt  Chief Complaint:  lymphoma  Subjective: Pt familiar to IR service from prior thoracenteses, right inguinal lymph node bx, port a cath placement with revision and most recently right ext iliac LN bx on 01/23/17. She has a hx of relapsing diffuse large B cell lymphoma and now with pancytopenia. Request received for CT guided bone marrow biopsy prior to salvage chemotherapy. Her main c/o today is left leg pain.  Past Medical History:  Diagnosis Date  . Abdominal pain, RUQ   . Abnormal EKG 08/07/2016  . Acute midline thoracic back pain   . Acute respiratory distress 09/25/2016  . Anemia 08/07/2016  . Chronic low back pain 09/25/2016  . Cough   . DLBCL (diffuse large B cell lymphoma) ([place]) 08/17/2016  . GERD (gastroesophageal reflux disease)   . History of chemotherapy   . Hypokalemia 09/25/2016  . 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 Lymphadenopathy   . Morbid obesity with BMI of 50.0-59.9, adult ([place])   . Neutrophilic leukocytosis 30/01/6008  . NHL (non-Hodgkin's lymphoma) ([place])   . Pleural effusion on right 08/07/2016  . Recurrent pleural effusion on right 10/08/2016  . Recurrent right pleural effusion 09/25/2016  . S/P thoracentesis   . Shortness of breath 08/07/2016  . SOB (shortness of breath) 09/26/2016   Past Surgical History:  Procedure Laterality Date  . ABDOMINAL HYSTERECTOMY    . IR CV LINE INJECTION  10/26/2016  . IR FLUORO GUIDE PORT INSERTION RIGHT  08/18/2016  . IR FLUORO GUIDE PORT INSERTION RIGHT  11/03/2016  . IR REMOVAL TUN CV CATH W/O FL  11/03/2016  . IR THORACENTESIS ASP PLEURAL SPACE W/IMG GUIDE  08/07/2016  . IR THORACENTESIS ASP PLEURAL SPACE W/IMG GUIDE  08/14/2016  . IR US GUIDE VASC ACCESS RIGHT  08/18/2016  . IR US GUIDE VASC ACCESS RIGHT  11/03/2016     Allergies: Patient has no known allergies.  Medications: Prior to Admission medications   Medication Sig  Start Date End Date Taking? Authorizing Provider  acetaminophen (TYLENOL) 500 MG tablet Take 1,000 mg by mouth every 6 (six) hours as needed for moderate pain.   Yes [provider]  aspirin EC 81 MG tablet Take 1 tablet (81 mg total) by mouth daily. 01/08/17  Yes Fay Records, MD  furosemide (LASIX) 20 MG tablet Take 1 tablet (20 mg total) by mouth daily. 10/11/16  Yes Eugenie Filler, MD  ibuprofen (ADVIL,MOTRIN) 200 MG tablet Take 800 mg by mouth 2 (two) times daily as needed for headache.   Yes [provider]  Multiple Vitamin (MULTIVITAMIN WITH MINERALS) TABS tablet Take 1 tablet by mouth daily.   Yes [provider]     Vital Signs: BP 100/79 (BP Location: Left Arm)   Pulse 77   Temp 99.4 F (37.4 C) (Oral)   Resp 16   Ht _0  (1.549 m)   Wt 253 lb 8.5 oz (115 kg)   SpO2 95%   BMI 47.90 kg/m   Physical Exam awake/alert; chest- sl dim BS rt base, left clear; heart- nl rate, irreg rhythm; abd - obese, soft,+BS,NT; no sig LE edema  Imaging:   Labs:  CBC: Recent Labs    02/06/17 0359 02/07/17 0343 02/08/17 0353 02/09/17 0317  WBC 21.4* 22.9* 17.7* 15.5*  HGB 9.3* 8.3* 8.8* 8.7*  HCT 29.7* 26.4* 28.3* 27.3*  PLT 185 153 91* 68*    COAGS:  Recent Labs    08/18/16 1057 09/25/16 1810 11/03/16 1030 01/23/17 0727  INR 1.16 1.10 1.09 1.14  APTT 28 28  --   --     BMP: Recent Labs    01/11/17 1116 02/05/17 1502 02/06/17 0359 02/07/17 0343  NA 142 140 138 135  K 3.5 3.5 3.0* 3.5  CL 108 105 104 103  CO2 _0 GLUCOSE 93 94 100* 106*  BUN _1 CALCIUM 9.3 9.4 8.7* 8.6*  CREATININE 0.77 1.02 0.87 0.93  GFRNONAA >60 56* >60 >60  GFRAA >60 >60 >60 >60    LIVER FUNCTION TESTS: Recent Labs    11/16/16 0926 12/07/16 0823 01/11/17 1116 02/05/17 1502  BILITOT 0.29 0.34 0.3 0.5  AST _2 37*  ALT _3 ALKPHOS 49 42 62 63  PROT 6.4 6.0* 6.6 6.8  ALBUMIN 3.0* 2.9* 3.3* 3.1*    Assessment and  Plan:  Pt with hx of relapsing diffuse large B cell lymphoma and now with pancytopenia. Request received for CT guided bone marrow biopsy prior to salvage chemotherapy.Risks and benefits discussed with the patient including, but not limited to bleeding, infection, damage to adjacent structures or low yield requiring additional tests.All of the patient's questions were answered, patient is agreeable to proceed. Consent signed and in chart.Procedure TENT scheduled for later this am.     Electronically Signed: D. Rowe Robert, PA-C 02/09/2017, 8:55 AM   I spent a total of 20 minutes at the the patient's bedside AND on the patient's hospital floor or unit, greater than 50% of which was counseling/coordinating care for CT guided bone marrow biopsy    Patient ID: Jo Collins, female   DOB: 1950/05/09, 67 y.o.   MRN: [id number]

## 2017-02-09 NOTE — Progress Notes (Addendum)
Patient is concerned about why MD wants to start her on medications for a fib because she has had this problem and goes in and out of fib. Patient wants to talk to MD.  Dr. Tawanna Solo calls and speaks with patient. MD ask nurse to monitor HR for 5 min Current HR is 86. This  Probation officer has explained to MD that 3W does not have the capabilities to monitor HR and if the doctor wants HR monitored the patient needs to be transferred to telemetry unit. Nurse currently at bedside with patient and heart rate reading are 70,80,115,45,107,110,126,133 via dinamap. MD on unit and made aware of heart rate.

## 2017-02-09 NOTE — Progress Notes (Signed)
Report called to [facility] on unit 4W. Patient to be transferred to [id number] wheelchair.

## 2017-02-09 NOTE — Progress Notes (Signed)
Pt arrived from 3W in W/C. Ambulated to bed w/ steady gait. Pt placed on tele and noted to be in a flutter 230s-170s sustaining. BP, O2 sat stable as charted. Pt denies SOB, dizziness, palpitations. Cardizem gtt with bolus initiated as ordered. Will monitor pt HR, rhythm, BP closely. Pt and hsuband oriented to callbell and environment. POC discussed.

## 2017-02-09 NOTE — Progress Notes (Addendum)
PROGRESS NOTE    Jo Collins  VZD:[id number] DOB: 01/28/1950 DOA: 02/05/2017 PCP: Deland Pretty, MD   Brief Narrative: Patient is a  67 y.o.femalewith medical history significantfordiffuse large B-cell lymphoma,recurrent right pleural effusion, PAF,intrathoracic mass, obesity.Patient saw her oncologist in clinic and was directly admitted to the hospitalist service. Patient reports onset of numbness on her chinjustbelow her lower lipover the past few days,initially in the centerof her chin and spread laterally,complains of numbness involvingonlyher dorsal surface ofherleft foot,around her second metatarsal.Patient denies weakness of her extremities,facial asymmetry,slurred speech or vision changes. Patient had CT guided biopsy of her right external iliac chain lymph node1/22/19, forconcerns ofB-cell lymphoma recurrence. Patient's WBC when she saw her oncologist was elevated at 22.7.Recommended admission for workupto include brain MRI to rule out brain metastases,CT chest/abdomen/pelvis to evaluate lymphoma progression,possible sacral/gluteal cleftcollection. MRI brain has been done and it doesnot show any acute intracranial abnormalities. CT chest/abdomen/pelvis summary :New chest wall mass involving the musculature of the right, anterior inferior chest wall, spanning from approximately the right sixth through the right eighth rib cartilages. There is a new adjacent subcutaneous soft tissue mass measuring 14 mm. Findings are consistent with new areas of soft tissue lymphoma. In the pelvis, there is enlargement of a left obturator lymph node when compared to the recent PET-CT, consistent with progression lymphoma. There is mild irregular wall thickening noted along the gallbladder mostly at the fundus. This could be a benign etiology, specifically adenomyomatosis.Infiltration of the gallbladder wall with lymphoma is possible.  Patient's blood cultures are also  positive for  coagulase-negative staph.Most likely contaminant.   She also has  Leukocytosis which is improving. Patient underwent CT-guided aspirate and core biopsy of the right iliac bone.  Assessment & Plan:   Principal Problem:   Numbness Active Problems:   Intrathoracic mass   Morbid obesity with BMI of 50.0-59.9, adult (HCC)   Diffuse large B cell lymphoma (HCC)   Recurrent right pleural effusion   PAF (paroxysmal atrial fibrillation) (HCC)   Positive blood culture   Abnormal CT of the abdomen  New intrathoracic mass: CT chest finding as above.Dr Irene Limbo evaluated the patient and planning for starting chemotherapy.  Positive blood cultures: Cultures showed coagulase-negative staph. Patient also has leukocytosis which is improving.D/Ced  Cefazoline.Started on Keflex.  We will continue for 4 more days.  DLBC lymphoma: Oncology  Following.Plan for starting chemotherapy Increased LDH noted. Underwent CT-guided aspirate and core biopsy of the right iliac bone.  Numbness/tingling sensation/pain : Presented with this symptom.  The symptoms of numbness and tingling are mainly on her chin and left lower extremity.? Neuropathy.Started on duloxetine. Patient does not complain of any weakness.  MRI did not show any brain mass. 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 We consulted orthopedics ( Dr. Ivor Messier because patient was complaining of persistent pain on the left hip which was radiating to her  left lower extremity. PT commended outpatient physical therapy Venous Doppler negative for DVT  Anemia of chronic disease: Most likely stated with her malignancy.  We will continue to monitor H&H.  PAF (paroxysmal atrial fibrillation): On aspirin. HR controlled at this time   Thrombocytopenia: Platelets level falling down.  Discontinued Lovenox.  Continue to monitor the  count  Morbid obesity: Counseled on diet and exercise   DVT prophylaxis: SCD Code Status: Full Family Communication: No family members present at the bedside Disposition Plan: Home  Consultants: Oncology  Procedures:None  Antimicrobials:Cefazoline since 02/07/17- 02/09/17                          Keflex since 02/09/17   Subjective: Patient seen and examined the patient this morning.  Undergoing bone biopsy today.  Still complains of left hip pain which was radaiting down her left lower extremity.   Objective: Vitals:   02/08/17 2313 02/09/17 1120 02/09/17 1130 02/09/17 1136  BP: 100/79 120/89 (!) 110/91 99/80  Pulse: 77 (!) 137 (!) 146 (!) 145  Resp: 16 (!) 30 18 10   Temp: 99.4 F (37.4 C)     TempSrc: Oral     SpO2: 95% 97% 100% 100%  Weight:      Height:        Intake/Output Summary (Last 24 hours) at 02/09/2017 1244 Last data filed at 02/08/2017 1900 Gross per 24 hour  Intake 240 ml  Output -  Net 240 ml   Filed Weights   02/08/17 0244  Weight: 115 kg (253 lb 8.5 oz)    Examination:  General exam: Appears calm and comfortable ,Not in distress,obese Respiratory system: Bilateral equal air entry, normal vesicular breath sounds, no wheezes or crackles  Cardiovascular system: S1 & S2 heard, RRR. No JVD, murmurs, rubs, gallops or clicks.  Gastrointestinal system: Abdomen is nondistended, soft and nontender. No organomegaly or masses felt. Normal bowel sounds heard. Central nervous system: Alert and oriented. No focal neurological deficits. Extremities: No edema, no clubbing ,no cyanosis, distal peripheral pulses palpable. Skin: No cyanosis,No pallor,No Rash,No Ulcer Psychiatry: Judgement and insight appear normal. Mood & affect appropriate.  GU: No Foley    Data Reviewed: I have personally reviewed following labs and imaging studies  CBC: Recent Labs  Lab 02/05/17 1502 02/06/17 0359 02/07/17 0343 02/08/17 0353 02/09/17 0317  WBC 22.7* 21.4* 22.9* 17.7*  15.5*  NEUTROABS 17.5*  --   --  15.4* 11.3*  HGB  --  9.3* 8.3* 8.8* 8.7*  HCT 33.8* 29.7* 26.4* 28.3* 27.3*  MCV 86.9 86.8 85.7 85.8 85.0  PLT 204 185 153 91* 68*   Basic Metabolic Panel: Recent Labs  Lab 02/05/17 1502 02/06/17 0359 02/07/17 0343  NA 140 138 135  K 3.5 3.0* 3.5  CL 105 104 103  CO2 22 24 24   GLUCOSE 94 100* 106*  BUN 12 12 13   CREATININE 1.02 0.87 0.93  CALCIUM 9.4 8.7* 8.6*   GFR: Estimated Creatinine Clearance: 71.1 mL/min (by C-G formula based on SCr of 0.93 mg/dL). Liver Function Tests: Recent Labs  Lab 02/05/17 1502  AST 37*  ALT 14  ALKPHOS 63  BILITOT 0.5  PROT 6.8  ALBUMIN 3.1*   No results for input(s): LIPASE, AMYLASE in the last 168 hours. No results for input(s): AMMONIA in the last 168 hours. Coagulation Profile: No results for input(s): INR, PROTIME in the last 168 hours. Cardiac Enzymes: No results for input(s): CKTOTAL, CKMB, CKMBINDEX, TROPONINI in the last 168 hours. BNP (last 3 results) No results for input(s): PROBNP in the last 8760 hours. HbA1C: No results for input(s): HGBA1C in the last 72 hours. CBG: No results for input(s): GLUCAP in the last 168 hours. Lipid Profile: No results for input(s): CHOL, HDL, LDLCALC, TRIG, CHOLHDL, LDLDIRECT in the last 72 hours. Thyroid Function Tests: No results for input(s): TSH, T4TOTAL, FREET4, T3FREE, THYROIDAB in the last 72 hours. Anemia Panel: No results for input(s): VITAMINB12, FOLATE, FERRITIN, TIBC, IRON, RETICCTPCT in  the last 72 hours. Sepsis Labs: Recent Labs  Lab 02/07/17 1336 02/08/17 0353 02/09/17 0317  PROCALCITON 1.10 1.42 1.87    Recent Results (from the past 240 hour(s))  Culture, blood (routine x 2)     Status: None (Preliminary result)   Collection Time: 02/05/17 11:02 PM  Result Value Ref Range Status   Specimen Description   Final    BLOOD LEFT ANTECUBITAL Performed at [facility] [address] [place], [place] [address]     Special Requests   Final    BOTTLES DRAWN AEROBIC AND ANAEROBIC Blood Culture adequate volume Performed at [facility] [address], [place], [place] [address]    Culture   Final    NO GROWTH 2 DAYS Performed at [facility] [address] [place], [place] [address]    Report Status PENDING  Incomplete  Culture, blood (routine x 2)     Status: Abnormal   Collection Time: 02/05/17 11:02 PM  Result Value Ref Range Status   Specimen Description   Final    BLOOD RIGHT ARM Performed at [facility] [address] [place], [place] [address]    Special Requests   Final    IN PEDIATRIC BOTTLE Blood Culture adequate volume Performed at [facility] [address] [place], [place] [address]    Culture  Setup Time   Final    GRAM POSITIVE COCCI IN PEDIATRIC BOTTLE CRITICAL RESULT CALLED TO, READ BACK BY AND VERIFIED WITH: B GREEN PHARMD 02/07/17 0127 JDW    Culture (A)  Final    STAPHYLOCOCCUS SPECIES (COAGULASE NEGATIVE) THE SIGNIFICANCE OF ISOLATING THIS ORGANISM FROM A SINGLE SET OF BLOOD CULTURES WHEN MULTIPLE SETS ARE DRAWN IS UNCERTAIN. PLEASE NOTIFY THE MICROBIOLOGY DEPARTMENT WITHIN ONE WEEK IF SPECIATION AND SENSITIVITIES ARE REQUIRED. Performed at [facility], [place] [address] [place], [place] [address]    Report Status 02/08/2017 FINAL  Final  Blood Culture ID Panel (Reflexed)     Status: Abnormal   Collection Time: 02/05/17 11:02 PM  Result Value Ref Range Status   Enterococcus species NOT DETECTED NOT DETECTED Final   Listeria monocytogenes NOT DETECTED NOT DETECTED Final   Staphylococcus species DETECTED (A) NOT DETECTED Final    Comment: Methicillin (oxacillin) susceptible coagulase negative staphylococcus. Possible blood culture contaminant (unless isolated from more than one blood culture draw or clinical case suggests pathogenicity). No antibiotic treatment is indicated for blood  culture  contaminants. CRITICAL RESULT CALLED TO, READ BACK BY AND VERIFIED WITH: B GREEN PHARMD 02/07/17 0127 JDW    Staphylococcus aureus NOT DETECTED NOT DETECTED Final   Methicillin resistance NOT DETECTED NOT DETECTED Final   Streptococcus species NOT DETECTED NOT DETECTED Final   Streptococcus agalactiae NOT DETECTED NOT DETECTED Final   Streptococcus pneumoniae NOT DETECTED NOT DETECTED Final   Streptococcus pyogenes NOT DETECTED NOT DETECTED Final   Acinetobacter baumannii NOT DETECTED NOT DETECTED Final   Enterobacteriaceae species NOT DETECTED NOT DETECTED Final   Enterobacter cloacae complex NOT DETECTED NOT DETECTED Final   Escherichia coli NOT DETECTED NOT DETECTED Final   Klebsiella oxytoca NOT DETECTED NOT DETECTED Final   Klebsiella pneumoniae NOT DETECTED NOT DETECTED Final   Proteus species NOT DETECTED NOT DETECTED Final   Serratia marcescens NOT DETECTED NOT DETECTED Final   Carbapenem resistance NOT DETECTED NOT DETECTED Final   Haemophilus influenzae NOT DETECTED NOT DETECTED Final   Neisseria meningitidis NOT DETECTED NOT  DETECTED Final   Pseudomonas aeruginosa NOT DETECTED NOT DETECTED Final   Candida albicans NOT DETECTED NOT DETECTED Final   Candida glabrata NOT DETECTED NOT DETECTED Final   Candida krusei NOT DETECTED NOT DETECTED Final   Candida parapsilosis NOT DETECTED NOT DETECTED Final   Candida tropicalis NOT DETECTED NOT DETECTED Final  Culture, blood (routine x 2)     Status: None (Preliminary result)   Collection Time: 02/07/17 11:17 AM  Result Value Ref Range Status   Specimen Description   Final    BLOOD LEFT ANTECUBITAL Performed at [facility] [address] [place], [place] [address]    Special Requests   Final    BOTTLES DRAWN AEROBIC AND ANAEROBIC Blood Culture adequate volume Performed at [facility] [address], [place], [place] [address]    Culture   Final    NO GROWTH 1 DAY Performed at [facility], [place] [address], [place], [place] [address]    Report Status PENDING  Incomplete  Culture, blood (routine x 2)     Status: None (Preliminary result)   Collection Time: 02/07/17 11:19 AM  Result Value Ref Range Status   Specimen Description   Final    BLOOD RIGHT ANTECUBITAL Performed at [facility] [address] [place], [place] [address]    Special Requests   Final    BOTTLES DRAWN AEROBIC AND ANAEROBIC Blood Culture adequate volume Performed at [facility] [address], [place], [place] [address]    Culture   Final    NO GROWTH 1 DAY Performed at [facility], [place] [address], [place], [place] [address]    Report Status PENDING  Incomplete         Radiology Studies:       Scheduled Meds: . aspirin EC  81 mg Oral Daily  . cephALEXin  500 mg Oral Q6H  . DULoxetine  30 mg Oral Daily  . enoxaparin (LOVENOX) injection  60 mg Subcutaneous Q24H  . fentaNYL      . midazolam       Continuous Infusions: . sodium chloride       LOS: 4 days    Time spent: 25 mins    Clea Dubach Jodie Echevaria, MD [facility] Pager [phone]  If 7PM-7AM, please contact night-coverage [email or web address] Password [facility] 02/09/2017, 12:44 PM

## 2017-02-09 NOTE — Progress Notes (Signed)
Physical Therapy Treatment Patient Details Name: Jo Collins MRN: [id number] DOB: 10-27-1950 Today's Date: 02/09/2017    History of Present Illness 67yo female presenting with c/o numbness in her chin and L foot. Imaging negative for DVT, fracture, and acute changes on MRI. PMH diffuse large B-cell lymphoma, PAF, intrathoracic mass, obesity, back pain, NHL, SOB     PT Comments    Pt  Will benefit from OPPT if agreeable ; will follow in acute setting; would be benefit from HEP  Follow Up Recommendations  Outpatient PT     Equipment Recommendations  None recommended by PT    Recommendations for Other Services       Precautions / Restrictions Precautions Precautions: Fall Restrictions Weight Bearing Restrictions: No    Mobility  Bed Mobility Overal bed mobility: Modified Independent             General bed mobility comments: increased time and effort   Transfers Overall transfer level: Needs assistance Equipment used: None Transfers: Stand Pivot Transfers   Stand pivot transfers: Min guard          Ambulation/Gait Ambulation/Gait assistance: Min guard;Min assist Ambulation Distance (Feet): 300 Feet Assistive device: 1 person hand held assist(or pushing dinamap, does not want walker) Gait Pattern/deviations: Step-through pattern;Decreased stride length;Trendelenburg     General Gait Details: pt with inefficient gait, does not want to use RW but seems agreabl eto caen after our discussion; pt with LOB x 2 but recovers with only min/guard for safety; pt is dyspneic (baseline per pt) and HR max of 165/min 45--MD and RN present upon departure and aware of tachycardia    Stairs            Wheelchair Mobility    Modified Rankin (Stroke Patients Only)       Balance           Standing balance support: During functional activity Standing balance-Leahy Scale: Fair Standing balance comment: requires UE support for wt shifting                             Cognition Arousal/Alertness: Awake/alert Behavior During Therapy: WFL for tasks assessed/performed Overall Cognitive Status: Within Functional Limits for tasks assessed                                        Exercises      General Comments        Pertinent Vitals/Pain Pain Assessment: No/denies pain    Home Living                      Prior Function            PT Goals (current goals can now be found in the care plan section) Acute Rehab PT Goals Patient Stated Goal: to go home/feel better  PT Goal Formulation: With patient Time For Goal Achievement: 02/22/17 Potential to Achieve Goals: Good Progress towards PT goals: Progressing toward goals    Frequency    Min 3X/week      PT Plan Current plan remains appropriate    Co-evaluation              AM-PAC PT "6 Clicks" Daily Activity  Outcome Measure  Difficulty turning over in bed (including adjusting bedclothes, sheets and blankets)?: None Difficulty moving from lying on back to sitting on  the side of the bed? : None Difficulty sitting down on and standing up from a chair with arms (e.g., wheelchair, bedside commode, etc,.)?: None Help needed moving to and from a bed to chair (including a wheelchair)?: A Little Help needed walking in hospital room?: A Little Help needed climbing 3-5 steps with a railing? : A Little 6 Click Score: 21    End of Session Equipment Utilized During Treatment: Gait belt Activity Tolerance: Patient tolerated treatment well Patient left: in chair;with call bell/phone within reach;with nursing/sitter in room;with family/visitor present;Other (comment)(MD)   PT Visit Diagnosis: Unsteadiness on feet (R26.81);Difficulty in walking, not elsewhere classified (R26.2)     Time: 7062-3762 PT Time Calculation (min) (ACUTE ONLY): 26 min  Charges:  $Gait Training: 23-37 mins                    G Codes:           Retha Bither 02/09/2017, 3:20 PM

## 2017-02-09 NOTE — Consult Note (Signed)
Reason for Consult:  Low back pain with radicular symptoms Referring Physician: Shelly Coss, MD  Jo Collins is an 67 y.o. female.  HPI: The patient is a pleasant 67 year old morbidly obese female who is in the hospital now due to the recurrence of a large B-cell lymphoma and has had a pleural effusion associated with this.  She is been complaining of low back pain with left-sided radicular symptoms.  An MRI was obtained of her lumbar spine showing significant stenosis and orthopedic surgery is consulted for further relation and treatment and recommendations.  I have spoken to the patient this been going on for a while but the radicular symptoms going down her left lower extremity have worsened.  She reports a numb sensation that goes from her sciatic region down to her foot.  She denies being a diabetic.  She has never had any type of back surgery.  She denies any recent change in bowel bladder function or leg weakness.  She does report low back pain to the left side that is significant for her.  Family is currently at the bedside and she appears comfortable.  Past Medical History:  Diagnosis Date  . Abdominal pain, RUQ   . Abnormal EKG 08/07/2016  . Acute midline thoracic back pain   . Acute respiratory distress 09/25/2016  . Anemia 08/07/2016  . Chronic low back pain 09/25/2016  . Cough   . DLBCL (diffuse large B cell lymphoma) ([place]) 08/17/2016  . GERD (gastroesophageal reflux disease)   . History of chemotherapy   . Hypokalemia 09/25/2016  . 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 Lymphadenopathy   . Morbid obesity with BMI of 50.0-59.9, adult ([place])   . Neutrophilic leukocytosis 38/07/5641  . NHL (non-Hodgkin's lymphoma) ([place])   . Pleural effusion on right 08/07/2016  . Recurrent pleural effusion on right 10/08/2016  . Recurrent right pleural effusion 09/25/2016  . S/P thoracentesis   . Shortness of breath 08/07/2016  . SOB (shortness of breath) 09/26/2016    Past Surgical History:  Procedure  Laterality Date  . ABDOMINAL HYSTERECTOMY    . IR CV LINE INJECTION  10/26/2016  . IR FLUORO GUIDE PORT INSERTION RIGHT  08/18/2016  . IR FLUORO GUIDE PORT INSERTION RIGHT  11/03/2016  . IR REMOVAL TUN CV CATH W/O FL  11/03/2016  . IR THORACENTESIS ASP PLEURAL SPACE W/IMG GUIDE  08/07/2016  . IR THORACENTESIS ASP PLEURAL SPACE W/IMG GUIDE  08/14/2016  . IR US GUIDE VASC ACCESS RIGHT  08/18/2016  . IR US GUIDE VASC ACCESS RIGHT  11/03/2016    Family History  Problem Relation Age of Onset  . COPD Mother   . Diabetes Mellitus II Sister   . Diabetes Mellitus II Brother   . Diabetes Mellitus II Sister   . Stroke Sister   . Hypertension Sister   . Diabetes Mellitus II Sister     Social History:  reports that she quit smoking about 6 months ago. Her smoking use included cigarettes. She has a 23.50 pack-year smoking history. she has never used smokeless tobacco. She reports that she does not drink alcohol or use drugs.  Allergies: No Known Allergies  Medications: I have reviewed the patient's current medications.  Results for orders placed or performed during the hospital encounter of 02/05/17 (from the past 48 hour(s))  Procalcitonin     Status: None   Collection Time: 02/08/17  3:53 AM  Result Value Ref Range   Procalcitonin 1.42 ng/mL    Comment:  Interpretation: PCT > 0.5 ng/mL and <= 2 ng/mL: Systemic infection (sepsis) is possible, but other conditions are known to elevate PCT as well. (NOTE)       Sepsis PCT Algorithm           Lower Respiratory Tract                                      Infection PCT Algorithm    ----------------------------     ----------------------------         PCT < 0.25 ng/mL                PCT < 0.10 ng/mL         Strongly encourage             Strongly discourage   discontinuation of antibiotics    initiation of antibiotics    ----------------------------     -----------------------------       PCT 0.25 - 0.50 ng/mL            PCT 0.10 - 0.25  ng/mL               OR       >80% decrease in PCT            Discourage initiation of                                            antibiotics      Encourage discontinuation           of antibiotics    ----------------------------     -----------------------------         PCT >= 0.50 ng/mL              PCT 0.26 - 0.50 ng/mL                AND       <80% decrease in PCT             Encourage initiation of                                             antibiotics       Encourage continuation           of antibiotics    ----------------------------     -----------------------------        PCT >= 0.50 ng/mL                  PCT > 0.50 ng/mL               AND         increase in PCT                  Strongly encourage                                      initiation of antibiotics    Strongly encourage escalation           of antibiotics                                     -----------------------------  PCT <= 0.25 ng/mL                                                 OR                                        > 80% decrease in PCT                                     Discontinue / Do not initiate                                             antibiotics Performed at [facility] [address] [place], [place] [address]   CBC with Differential/Platelet     Status: Abnormal   Collection Time: 02/08/17  3:53 AM  Result Value Ref Range   WBC 17.7 (H) 4.0 - 10.5 K/uL   RBC 3.30 (L) 3.87 - 5.11 MIL/uL   Hemoglobin 8.8 (L) 12.0 - 15.0 g/dL   HCT 28.3 (L) 36.0 - 46.0 %   MCV 85.8 78.0 - 100.0 fL   MCH 26.7 26.0 - 34.0 pg   MCHC 31.1 30.0 - 36.0 g/dL   RDW 15.9 (H) 11.5 - 15.5 %   Platelets 91 (L) 150 - 400 K/uL    Comment: REPEATED TO VERIFY SPECIMEN CHECKED FOR CLOTS PLATELET COUNT CONFIRMED BY SMEAR DELTA CHECK NOTED    Neutrophils Relative % 54 %   Lymphocytes Relative 7 %   Monocytes Relative 5 %   Eosinophils Relative 1  %   Basophils Relative 0 %   Band Neutrophils 23 %   Metamyelocytes Relative 4 %   Myelocytes 6 %   Neutro Abs 15.4 (H) 1.7 - 7.7 K/uL   Lymphs Abs 1.2 0.7 - 4.0 K/uL   Monocytes Absolute 0.9 0.1 - 1.0 K/uL   Eosinophils Absolute 0.2 0.0 - 0.7 K/uL   Basophils Absolute 0.0 0.0 - 0.1 K/uL   RBC Morphology POLYCHROMASIA PRESENT    WBC Morphology INCREASED BANDS (>20% BANDS)     Comment: MODERATE LEFT SHIFT (>5% METAS AND MYELOS,OCC PRO NOTED) Performed at [facility] [address] [place], [place] [address]   Lactate dehydrogenase     Status: Abnormal   Collection Time: 02/08/17  3:53 AM  Result Value Ref Range   LDH 2,624 (H) 98 - 192 U/L    Comment: RESULTS CONFIRMED BY MANUAL DILUTION Performed at [facility], [place] [address] [place], [place] [address]   Procalcitonin     Status: None   Collection Time: 02/09/17  3:17 AM  Result Value Ref Range   Procalcitonin 1.87 ng/mL    Comment:        Interpretation: PCT > 0.5 ng/mL and <= 2 ng/mL: Systemic infection (sepsis) is possible, but other conditions are known to elevate PCT as well. (NOTE)       Sepsis PCT Algorithm           Lower Respiratory Tract  Infection PCT Algorithm    ----------------------------     ----------------------------         PCT < 0.25 ng/mL                PCT < 0.10 ng/mL         Strongly encourage             Strongly discourage   discontinuation of antibiotics    initiation of antibiotics    ----------------------------     -----------------------------       PCT 0.25 - 0.50 ng/mL            PCT 0.10 - 0.25 ng/mL               OR       >80% decrease in PCT            Discourage initiation of                                            antibiotics      Encourage discontinuation           of antibiotics    ----------------------------     -----------------------------         PCT >= 0.50 ng/mL              PCT 0.26 - 0.50  ng/mL                AND       <80% decrease in PCT             Encourage initiation of                                             antibiotics       Encourage continuation           of antibiotics    ----------------------------     -----------------------------        PCT >= 0.50 ng/mL                  PCT > 0.50 ng/mL               AND         increase in PCT                  Strongly encourage                                      initiation of antibiotics    Strongly encourage escalation           of antibiotics                                     -----------------------------                                           PCT <= 0.25 ng/mL  OR                                        > 80% decrease in PCT                                     Discontinue / Do not initiate                                             antibiotics Performed at [facility] [address], [place], [place] [address]   CBC with Differential/Platelet     Status: Abnormal   Collection Time: 02/09/17  3:17 AM  Result Value Ref Range   WBC 15.5 (H) 4.0 - 10.5 K/uL   RBC 3.21 (L) 3.87 - 5.11 MIL/uL   Hemoglobin 8.7 (L) 12.0 - 15.0 g/dL   HCT 27.3 (L) 36.0 - 46.0 %   MCV 85.0 78.0 - 100.0 fL   MCH 27.1 26.0 - 34.0 pg   MCHC 31.9 30.0 - 36.0 g/dL   RDW 16.2 (H) 11.5 - 15.5 %   Platelets 68 (L) 150 - 400 K/uL    Comment: CONSISTENT WITH PREVIOUS RESULT   Neutrophils Relative % 54 %   Lymphocytes Relative 9 %   Monocytes Relative 15 %   Eosinophils Relative 3 %   Basophils Relative 0 %   Band Neutrophils 10 %   Metamyelocytes Relative 2 %   Myelocytes 7 %   Neutro Abs 11.3 (H) 1.7 - 7.7 K/uL   Lymphs Abs 1.4 0.7 - 4.0 K/uL   Monocytes Absolute 2.3 (H) 0.1 - 1.0 K/uL   Eosinophils Absolute 0.5 0.0 - 0.7 K/uL   Basophils Absolute 0.0 0.0 - 0.1 K/uL   RBC Morphology POLYCHROMASIA PRESENT    WBC Morphology      MODERATE LEFT SHIFT (>5%  METAS AND MYELOS,OCC PRO NOTED)    Comment: Performed at [facility], [place] [address], [place], [place] [address]  Urinalysis, Routine w reflex microscopic     Status: Abnormal   Collection Time: 02/09/17  3:45 AM  Result Value Ref Range   Color, Urine AMBER (A) YELLOW    Comment: BIOCHEMICALS MAY BE AFFECTED BY COLOR   APPearance CLOUDY (A) CLEAR   Specific Gravity, Urine 1.038 (H) 1.005 - 1.030   pH 5.0 5.0 - 8.0   Glucose, UA NEGATIVE NEGATIVE mg/dL   Hgb urine dipstick SMALL (A) NEGATIVE   Bilirubin Urine NEGATIVE NEGATIVE   Ketones, ur 5 (A) NEGATIVE mg/dL   Protein, ur 100 (A) NEGATIVE mg/dL   Nitrite NEGATIVE NEGATIVE   Leukocytes, UA NEGATIVE NEGATIVE   RBC / HPF 6-30 0 - 5 RBC/hpf   WBC, UA 0-5 0 - 5 WBC/hpf   Bacteria, UA RARE (A) NONE SEEN   Squamous Epithelial / LPF 6-30 (A) NONE SEEN   Mucus PRESENT    Ca Oxalate Crys, UA PRESENT     Comment: Performed at [facility], [place] [address] [place], [place] [address]    Mr Lumbar Spine Wo Contrast  Result Date: 02/08/2017 CLINICAL DATA:  New onset severe low back and left lower extremity pain in a patient  with a history of B-cell lymphoma. EXAM: MRI LUMBAR SPINE WITHOUT CONTRAST TECHNIQUE: Multiplanar, multisequence MR imaging of the lumbar spine was performed. No intravenous contrast was administered. COMPARISON:  CT chest, abdomen and pelvis 02/06/2017. PET CT scan 01/05/2017. FINDINGS: This exam is limited by the patient's body habitus. The standard coil could not be used. Additionally, the patient could not lie flat. Segmentation: Standard. Alignment: Facet degenerative disease results in 0.3 cm anterolisthesis L4 on L5. Otherwise maintained. Vertebrae: No fracture. A few small hemangiomas are seen. Marrow signal is somewhat decreased on T1 weighted imaging. Conus medullaris and cauda equina: Conus extends to the L2 level. Conus and cauda equina appear normal. Paraspinal and other soft  tissues: Negative. Disc levels: T11-12 and T12-L1 are imaged in the sagittal plane only. Disc bulging at both levels is more prominent at T12-L1 but the central canal and foramina appear open. L1-2: Negative. L2-3: There is a shallow disc bulge with some ligamentum flavum thickening and facet degenerative change. The central spinal canal and foramina appear open. L3-4: Shallow disc bulge, moderate facet degenerative disease and ligamentum flavum thickening. The central canal and foramina remain open. L4-5: Advanced facet degenerative change and ligamentum flavum thickening are seen. The disc is uncovered and bulging. There is severe central canal stenosis. Mild to moderate foraminal narrowing is worse on the right. L5-S1: Right worse than left facet degenerative disease with associated small right effusion. The patient has a shallow disc bulge but the central canal is widely patent. Mild right foraminal narrowing is noted. The left foramen is open. IMPRESSION: Spondylosis worst at L4-5 where there is severe central canal stenosis and mild to moderate foraminal narrowing, worse on the right. Advanced facet degenerative disease at this level results in 0.3 cm anterolisthesis. Right worse than left facet arthropathy at L5-S1 with an associated right facet joint effusion. Facet arthropathy and disc cause mild right foraminal narrowing at this level. The central canal and left foramen are open. Somewhat decreased T1 signal in all imaged bones could be related to the patient's history of lymphoma or obesity. Electronically Signed   By: Inge Rise M.D.   On: 02/08/2017 11:50   Ct Bone Marrow Biopsy & Aspiration  Result Date: 02/09/2017 INDICATION: 67 year old female with pancytopenia and possible diffuse large B-cell lymphoma. EXAM: CT GUIDED BONE MARROW ASPIRATION AND CORE BIOPSY Interventional Radiologist:  Criselda Peaches, MD MEDICATIONS: None. ANESTHESIA/SEDATION: Moderate (conscious) sedation was employed  during this procedure. A total of 2 milligrams versed and 100 micrograms fentanyl were administered intravenously. The patient's level of consciousness and vital signs were monitored continuously by radiology nursing throughout the procedure under my direct supervision. Total monitored sedation time: 11 minutes FLUOROSCOPY TIME:  Fluoroscopy Time: 0 minutes 0 seconds (0 mGy). COMPLICATIONS: None immediate. Estimated blood loss: <25 mL PROCEDURE: Informed written consent was obtained from the patient after a thorough discussion of the procedural risks, benefits and alternatives. All questions were addressed. Maximal Sterile Barrier Technique was utilized including caps, mask, sterile gowns, sterile gloves, sterile drape, hand hygiene and skin antiseptic. A timeout was performed prior to the initiation of the procedure. The patient was positioned prone and non-contrast localization CT was performed of the pelvis to demonstrate the iliac marrow spaces. Maximal barrier sterile technique utilized including caps, mask, sterile gowns, sterile gloves, large sterile drape, hand hygiene, and betadine prep. Under sterile conditions and local anesthesia, an 11 gauge coaxial bone biopsy needle was advanced into the right iliac marrow space. Needle position was confirmed  with CT imaging. Initially, bone marrow aspiration was performed. Next, the 11 gauge outer cannula was utilized to obtain a right iliac bone marrow core biopsy. Needle was removed. Hemostasis was obtained with compression. The patient tolerated the procedure well. Samples were prepared with the cytotechnologist. IMPRESSION: Technically successful CT-guided bone marrow biopsy. Electronically Signed   By: Jacqulynn Cadet M.D.   On: 02/09/2017 13:49   I independently reviewed the MRI of her lumbar spine and there is significant lumbar stenosis at L4-L5.  She has significant degenerative changes that look more right-sided than left but there is still a  significant degree of stenosis at this level.  There is otherwise no acute findings in terms of fracture or lesions that can be seen in the spine.  ROS Blood pressure 99/80, pulse (!) 145, temperature 99.4 F (37.4 C), temperature source Oral, resp. rate 10, height 5' 1"  (1.549 m), weight 253 lb 8.5 oz (115 kg), SpO2 100 %. Physical Exam  Constitutional: She is oriented to person, place, and time. She appears well-developed and well-nourished.  HENT:  Head: Normocephalic and atraumatic.  Neck: Normal range of motion.  Neurological: She is alert and oriented to person, place, and time.  Skin: Skin is warm and dry.  Psychiatric: She has a normal mood and affect.   Examination of her back shows pain to palpation of her lower facet joints bilaterally at the lowest aspect of her lumbar spine.  There is a radicular component into the left sciatic region.  She has subjective decreased sensation in her left foot but 5 out of 5 dorsiflexion and plantarflexion of the foot.   Assessment/Plan: Low back pain with lumbar spinal stenosis and left-sided radicular symptoms.  The patient would certainly benefit from physical therapy to work with mobility and definitely outpatient physical therapy to work on any types of modalities that can help decrease her left-sided radicular symptoms and decrease her back pain.  Unfortunately weight is a significant component of this as well as her stenosis.  Given the radicular symptoms I will start her on 300 mg of Neurontin 3 times a day to see if this will help decrease some of her radicular component of her pain.  Certainly a short course of steroids such as a Medrol Dosepak could be helpful or even a short course of anti-inflammatories.  Usually patients like this as an outpatient are sometimes set up for an epidural steroid injection.  This can always be done as an outpatient at a later time.  There is nothing else that I would recommend during this hospitalization for  her back.  We can certainly see her in follow-up as an outpatient if needed.  Mcarthur Rossetti 02/09/2017, 2:52 PM

## 2017-02-09 NOTE — Progress Notes (Signed)
Patient returned from IR for biopsy with report from IR nurse that patient has converted to afib. Radiology PA also called and asked that MD be made aware of patient converting to afib. Message sent to Dr Tawanna Solo. Patient is resting comfortably in bed with no complaints.

## 2017-02-09 NOTE — Procedures (Signed)
Interventional Radiology Procedure Note  Procedure: CT guided aspirate and core biopsy of right iliac bone Complications: None Recommendations: - Bedrest supine x 1 hrs - Hydrocodone PRN  Pain - Follow biopsy results  Signed,  Heath K. McCullough, MD   

## 2017-02-09 NOTE — Progress Notes (Signed)
High priority message sent per Dr. Irene Limbo to schedule patient for Lab/Flush/MD Day 1 Gemzar/Carbo on 2/13 then Lab/Flush/MD/Gemzar/Rituxan on day 8 2/20.

## 2017-02-09 NOTE — Progress Notes (Signed)
EKG - a flutter. MD Adhikari made aware. MD also made aware that cardizem gtt can not be started on this unit because we have no cardiac monitoring capabilities.

## 2017-02-09 NOTE — Progress Notes (Signed)
Pt has converted to NSR 70-80s.  Dr Tawanna Solo aware.  Orders received to d/c card gtt.

## 2017-02-10 ENCOUNTER — Other Ambulatory Visit: Payer: Self-pay | Admitting: Hematology

## 2017-02-10 LAB — CBC WITH DIFFERENTIAL/PLATELET
BAND NEUTROPHILS: 18 %
BASOS PCT: 1 %
Basophils Absolute: 0.2 10*3/uL — ABNORMAL HIGH (ref 0.0–0.1)
EOS ABS: 0 10*3/uL (ref 0.0–0.7)
Eosinophils Relative: 0 %
HCT: 25.7 % — ABNORMAL LOW (ref 36.0–46.0)
HEMOGLOBIN: 8.4 g/dL — AB (ref 12.0–15.0)
Lymphocytes Relative: 6 %
Lymphs Abs: 0.9 10*3/uL (ref 0.7–4.0)
MCH: 27.4 pg (ref 26.0–34.0)
MCHC: 32.7 g/dL (ref 30.0–36.0)
MCV: 83.7 fL (ref 78.0–100.0)
MYELOCYTES: 3 %
Metamyelocytes Relative: 5 %
Monocytes Absolute: 0.6 10*3/uL (ref 0.1–1.0)
Monocytes Relative: 4 %
NEUTROS ABS: 13.4 10*3/uL — AB (ref 1.7–7.7)
Neutrophils Relative %: 63 %
Platelets: 44 10*3/uL — ABNORMAL LOW (ref 150–400)
RBC: 3.07 MIL/uL — ABNORMAL LOW (ref 3.87–5.11)
RDW: 16.3 % — ABNORMAL HIGH (ref 11.5–15.5)
WBC: 15.1 10*3/uL — ABNORMAL HIGH (ref 4.0–10.5)

## 2017-02-10 LAB — URINE CULTURE

## 2017-02-10 MED ORDER — GABAPENTIN 300 MG PO CAPS
300.0000 mg | ORAL_CAPSULE | Freq: Three times a day (TID) | ORAL | Status: DC
  Administered 2017-02-10: 300 mg via ORAL
  Filled 2017-02-10: qty 1

## 2017-02-10 MED ORDER — CEPHALEXIN 500 MG PO CAPS: 500.0000 mg | ORAL_CAPSULE | Freq: Four times a day (QID) | ORAL | 0 refills | Status: AC

## 2017-02-10 MED ORDER — DEXAMETHASONE 4 MG PO TABS: 8.0000 mg | ORAL_TABLET | Freq: Every day | ORAL | 0 refills | Status: DC

## 2017-02-10 MED ORDER — GABAPENTIN 300 MG PO CAPS: 300.0000 mg | ORAL_CAPSULE | Freq: Three times a day (TID) | ORAL | 0 refills | Status: DC

## 2017-02-10 NOTE — Discharge Summary (Signed)
Physician Discharge Summary  Jo Collins PIR:[id number] DOB: 03-09-1950 DOA: 02/05/2017  PCP: Deland Pretty, MD  Admit date: 02/05/2017 Discharge date: 02/10/2017  Admitted From: Home Disposition:  Home   Home Health:No Equipment/Devices:None  Discharge Condition: Stable CODE STATUS:Full Diet recommendation: Heart Healthy  Brief/Interim Summary: Patient is a  67 y.o.femalewith medical history significantfordiffuse large B-cell lymphoma,recurrent right pleural effusion, PAF,intrathoracic mass,obesity.Patient saw her oncologist in clinicand was directly admitted to the hospitalist service. Patient reported onset of numbness on her chinjustbelow her lower lipover the past few days,initially in the centerof her chin and spread laterally,complained of numbness involvingonlyher dorsal surface ofherleft foot,around her second metatarsal.Patient denies weakness of her extremities,facial asymmetry,slurred speech or vision changes. Patient had CT guided biopsy of her right external iliac chain lymph node1/22/19, forconcerns ofB-cell lymphoma recurrence. Patient's WBC when she saw her oncologist was elevated at 22.7.Recommended admission for workupto include brain MRI to rule out brain metastases,CT chest/abdomen/pelvis to evaluate lymphoma progression,possible sacral/gluteal cleftcollection.  MRI brain has been done on this admission and it doesnot show any acute intracranial abnormalities.  CT chest/abdomen/pelvis summary :New chest wall mass involving the musculature of the right, anterior inferior chest wall, spanning from approximately the right sixth through the right eighth rib cartilages. There is a new adjacent subcutaneous soft tissue mass measuring 14 mm. Findings are consistent with new areas of soft tissue lymphoma. In the pelvis, there is enlargement of a left obturator lymph node when compared to the recent PET-CT, consistent with  progression lymphoma. There is mild irregular wall thickening noted along the gallbladder mostly at the fundus. This could be a benign etiology, specifically adenomyomatosis.Infiltration of the gallbladder wall with lymphoma is possible.  Patient's blood cultures are also positive for  coagulase-negative staph.Most likely contaminant.   She also has  Leukocytosis which is improving. Patient underwent CT-guided aspirate and core biopsy of the right iliac bone on 02/09/17  Patient is a stable to be discharged home today.  She will follow-up with oncology, orthopedics and physical therapy as an outpatient.  Following problems were addressed during hospitalization:  New intrathoracic mass: CT chest finding as above.Dr Irene Limbo evaluated the patient and planning for starting chemotherapy as an outpatient.  Positive blood cultures: Cultures showed coagulase-negative staph. Patient also has leukocytosis which is improving.D/Ced  Cefazoline.Started on Keflex.  We will continue for 3 more days.  DLBC lymphoma: Oncology  was Following.Plan for starting chemotherapy Increased LDH noted. Underwent CT-guided aspirate and core biopsy of the right iliac bone on 02/09/17  Numbness/tingling sensation/pain : Presented with these symptoms.  The symptoms of numbness and tingling are mainly on her chin and left lower extremity.? Neuropathy.Started on gabapentin. Patient does not complain of any weakness.  MRI did not show any brain mass. 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 We consulted orthopedics ( Dr. Ninfa Linden) who recommended outpatient physical therapy and also follow-up with outpatient orthopedics . PT commended outpatient physical therapy Venous Doppler negative for DVT  Anemia of chronic disease: Most likely associated with her malignancy.    PAF (paroxysmal atrial  fibrillation): On aspirin. Was in afib with RVR on 02/09/17.HR controlled at this time  Thrombocytopenia: Platelets level fell down.  Discontinued Lovenox.  Recommended to do CBC test to check platelets level on her visit with oncology or  PCP .  Morbid obesity: Counseled on diet and exercise     Discharge Diagnoses:  Principal  Problem:   Numbness Active Problems:   Intrathoracic mass   Morbid obesity with BMI of 50.0-59.9, adult (HCC)   Diffuse large B cell lymphoma (HCC)   Recurrent right pleural effusion   PAF (paroxysmal atrial fibrillation) (HCC)   Positive blood culture   Abnormal CT of the abdomen   Thrombocytopenia (HCC)   Left-sided low back pain with left-sided sciatica    Discharge Instructions  Discharge Instructions    AMB REFERRAL TO PHYSICAL THERAPY   Complete by:  As directed    Diet - low sodium heart healthy   Complete by:  As directed    Discharge instructions   Complete by:  As directed    1) Take prescribed medication as instructed. 2) Follow up with Dr Kale,oncology, as an outpatient as soon as possible.Do a CBC test to check your platelets level during that visit. 3) Follow up with outpatient physical therapy. 4) Follow up with Dr. Valere Dross in 2-3 weeks as an outpatient.   Increase activity slowly   Complete by:  As directed      Allergies as of 02/10/2017   No Known Allergies     Medication List    TAKE these medications   acetaminophen 500 MG tablet Commonly known as:  TYLENOL Take 1,000 mg by mouth every 6 (six) hours as needed for moderate pain.   aspirin EC 81 MG tablet Take 1 tablet (81 mg total) by mouth daily.   cephALEXin 500 MG capsule Commonly known as:  KEFLEX Take 1 capsule (500 mg total) by mouth every 6 (six) hours for 3 days.   dexamethasone 4 MG tablet Commonly known as:  DECADRON Take 2 tablets (8 mg total) by mouth daily for 10 days.   furosemide 20 MG tablet Commonly known as:  LASIX Take 1 tablet  (20 mg total) by mouth daily.   gabapentin 300 MG capsule Commonly known as:  NEURONTIN Take 1 capsule (300 mg total) by mouth 3 (three) times daily.   ibuprofen 200 MG tablet Commonly known as:  ADVIL,MOTRIN Take 800 mg by mouth 2 (two) times daily as needed for headache.   multivitamin with minerals Tabs tablet Take 1 tablet by mouth daily.      Follow-up Information    Deland Pretty, MD. Schedule an appointment as soon as possible for a visit in 1 week(s).   Specialty:  Internal Medicine Contact information: [address] [place] [address] [phone]        Brunetta Genera, MD. Schedule an appointment as soon as possible for a visit in 1 week(s).   Specialties:  Hematology, Oncology Contact information: [place] [address] [phone]        Mcarthur Rossetti, MD. Schedule an appointment as soon as possible for a visit in 2 week(s).   Specialty:  Orthopedic Surgery Contact information: [place] [address] [phone]          No Known Allergies  Consultations: Oncology, orthopedics  Procedures/Studies:      Subjective: Patient seen and examined the bedside this morning. Feels comfortable today.  Heart rate is controlled. I explained about the discharge planning.  Discharge Exam: Vitals:   02/10/17 0603 02/10/17 1239  BP: 124/71 133/67  Pulse: 72 75  Resp: 18 14  Temp: 98.1 F (36.7 C) 98.2 F (36.8 C)  SpO2: 99% 99%   Vitals:   02/09/17 1706 02/09/17 2124 02/10/17 0603 02/10/17 1239  BP: (!) 92/54  120/82 124/71 133/67  Pulse:  88 72 75  Resp: 16 18 18 14   Temp:  99.7 F (37.6 C) 98.1 F (36.7 C) 98.2 F (36.8 C)  TempSrc:  Oral Oral Oral  SpO2: 96% 100% 99% 99%  Weight:      Height:        General: Pt is alert, awake, not in acute distress,obese Cardiovascular: RRR, S1/S2 +, no rubs, no gallops Respiratory: CTA bilaterally, no wheezing,  no rhonchi Abdominal: Soft, NT, ND, bowel sounds + Extremities: no edema, no cyanosis    The results of significant diagnostics from this hospitalization (including imaging, microbiology, ancillary and laboratory) are listed below for reference.     Microbiology: Recent Results (from the past 240 hour(s))  Culture, blood (routine x 2)     Status: None (Preliminary result)   Collection Time: 02/05/17 11:02 PM  Result Value Ref Range Status   Specimen Description   Final    BLOOD LEFT ANTECUBITAL Performed at [facility] [address], [place], [place] [address]    Special Requests   Final    BOTTLES DRAWN AEROBIC AND ANAEROBIC Blood Culture adequate volume Performed at [facility] [address] [place], [place] [address]    Culture   Final    NO GROWTH 4 DAYS Performed at [facility], [place] [address] [place], [place] [address]    Report Status PENDING  Incomplete  Culture, blood (routine x 2)     Status: Abnormal   Collection Time: 02/05/17 11:02 PM  Result Value Ref Range Status   Specimen Description   Final    BLOOD RIGHT ARM Performed at [facility] [address], [place], [place] [address]    Special Requests   Final    IN PEDIATRIC BOTTLE Blood Culture adequate volume Performed at [facility] [address], [place], [place]  [address]    Culture  Setup Time   Final    GRAM POSITIVE COCCI IN PEDIATRIC BOTTLE CRITICAL RESULT CALLED TO, READ BACK BY AND VERIFIED WITH: B GREEN PHARMD 02/07/17 0127 JDW    Culture (A)  Final    STAPHYLOCOCCUS SPECIES (COAGULASE NEGATIVE) THE SIGNIFICANCE OF ISOLATING THIS ORGANISM FROM A SINGLE SET OF BLOOD CULTURES WHEN MULTIPLE SETS ARE DRAWN IS UNCERTAIN. PLEASE NOTIFY THE MICROBIOLOGY DEPARTMENT WITHIN ONE WEEK IF SPECIATION AND SENSITIVITIES ARE REQUIRED. Performed at [facility], [place] [address], [place], [place] [address]    Report  Status 02/08/2017 FINAL  Final  Blood Culture ID Panel (Reflexed)     Status: Abnormal   Collection Time: 02/05/17 11:02 PM  Result Value Ref Range Status   Enterococcus species NOT DETECTED NOT DETECTED Final   Listeria monocytogenes NOT DETECTED NOT DETECTED Final   Staphylococcus species DETECTED (A) NOT DETECTED Final    Comment: Methicillin (oxacillin) susceptible coagulase negative staphylococcus. Possible blood culture contaminant (unless isolated from more than one blood culture draw or clinical case suggests pathogenicity). No antibiotic treatment is indicated for blood  culture contaminants. CRITICAL RESULT CALLED TO, READ BACK BY AND VERIFIED WITH: B GREEN PHARMD 02/07/17 0127 JDW    Staphylococcus aureus NOT DETECTED NOT DETECTED Final   Methicillin resistance NOT DETECTED NOT DETECTED Final   Streptococcus species NOT DETECTED NOT DETECTED Final   Streptococcus agalactiae NOT DETECTED NOT DETECTED Final   Streptococcus pneumoniae NOT DETECTED NOT DETECTED Final   Streptococcus pyogenes NOT DETECTED NOT DETECTED Final  Acinetobacter baumannii NOT DETECTED NOT DETECTED Final   Enterobacteriaceae species NOT DETECTED NOT DETECTED Final   Enterobacter cloacae complex NOT DETECTED NOT DETECTED Final   Escherichia coli NOT DETECTED NOT DETECTED Final   Klebsiella oxytoca NOT DETECTED NOT DETECTED Final   Klebsiella pneumoniae NOT DETECTED NOT DETECTED Final   Proteus species NOT DETECTED NOT DETECTED Final   Serratia marcescens NOT DETECTED NOT DETECTED Final   Carbapenem resistance NOT DETECTED NOT DETECTED Final   Haemophilus influenzae NOT DETECTED NOT DETECTED Final   Neisseria meningitidis NOT DETECTED NOT DETECTED Final   Pseudomonas aeruginosa NOT DETECTED NOT DETECTED Final   Candida albicans NOT DETECTED NOT DETECTED Final   Candida glabrata NOT DETECTED NOT DETECTED Final   Candida krusei NOT DETECTED NOT DETECTED Final   Candida parapsilosis NOT DETECTED NOT DETECTED  Final   Candida tropicalis NOT DETECTED NOT DETECTED Final  Culture, blood (routine x 2)     Status: None (Preliminary result)   Collection Time: 02/07/17 11:17 AM  Result Value Ref Range Status   Specimen Description   Final    BLOOD LEFT ANTECUBITAL Performed at [facility] [address], [place], [place] [address]    Special Requests   Final    BOTTLES DRAWN AEROBIC AND ANAEROBIC Blood Culture adequate volume Performed at [facility] [address] [place], [place] [address]    Culture   Final    NO GROWTH 3 DAYS Performed at [facility], [place] [address] [place], [place] [address]    Report Status PENDING  Incomplete  Culture, blood (routine x 2)     Status: None (Preliminary result)   Collection Time: 02/07/17 11:19 AM  Result Value Ref Range Status   Specimen Description   Final    BLOOD RIGHT ANTECUBITAL Performed at [facility] [address] [place], [place] [address]    Special Requests   Final    BOTTLES DRAWN AEROBIC AND ANAEROBIC Blood Culture adequate volume Performed at [facility] [address], [place], [place] [address]    Culture   Final    NO GROWTH 3 DAYS Performed at [facility], [place] [address], [place], [place] [address]    Report Status PENDING  Incomplete  Culture, Urine     Status: Abnormal   Collection Time: 02/09/17  3:45 AM  Result Value Ref Range Status   Specimen Description   Final    URINE, CLEAN CATCH Performed at [facility], [place] [address], [place], [place] [address]    Special Requests   Final    NONE Performed at [facility], [place] [address], [place], [place] [address]    Culture MULTIPLE SPECIES PRESENT, SUGGEST RECOLLECTION (A)  Final   Report Status 02/10/2017 FINAL  Final     Labs: BNP (last 3 results) Recent Labs    08/07/16 0023 10/07/16 2300  BNP 39.4 76.5   Basic Metabolic  Panel: Recent Labs  Lab 02/05/17 1502 02/06/17 0359 02/07/17 0343  NA 140 138 135  K 3.5 3.0* 3.5  CL 105 104 103  CO2 22 24 24   GLUCOSE 94 100* 106*  BUN 12 12 13   CREATININE 1.02 0.87 0.93  CALCIUM 9.4 8.7* 8.6*   Liver Function Tests: Recent Labs  Lab 02/05/17 1502  AST 37*  ALT 14  ALKPHOS 63  BILITOT 0.5  PROT 6.8  ALBUMIN 3.1*   No results  for input(s): LIPASE, AMYLASE in the last 168 hours. No results for input(s): AMMONIA in the last 168 hours. CBC: Recent Labs  Lab 02/05/17 1502 02/06/17 0359 02/07/17 0343 02/08/17 0353 02/09/17 0317 02/10/17 0502  WBC 22.7* 21.4* 22.9* 17.7* 15.5* 15.1*  NEUTROABS 17.5*  --   --  15.4* 11.3* 13.4*  HGB  --  9.3* 8.3* 8.8* 8.7* 8.4*  HCT 33.8* 29.7* 26.4* 28.3* 27.3* 25.7*  MCV 86.9 86.8 85.7 85.8 85.0 83.7  PLT 204 185 153 91* 68* 44*   Cardiac Enzymes: No results for input(s): CKTOTAL, CKMB, CKMBINDEX, TROPONINI in the last 168 hours. BNP: Invalid input(s): POCBNP CBG: No results for input(s): GLUCAP in the last 168 hours. D-Dimer No results for input(s): DDIMER in the last 72 hours. Hgb A1c No results for input(s): HGBA1C in the last 72 hours. Lipid Profile No results for input(s): CHOL, HDL, LDLCALC, TRIG, CHOLHDL, LDLDIRECT in the last 72 hours. Thyroid function studies No results for input(s): TSH, T4TOTAL, T3FREE, THYROIDAB in the last 72 hours.  Invalid input(s): FREET3 Anemia work up No results for input(s): VITAMINB12, FOLATE, FERRITIN, TIBC, IRON, RETICCTPCT in the last 72 hours. Urinalysis    Component Value Date/Time   COLORURINE AMBER (A) 02/09/2017 0345   APPEARANCEUR CLOUDY (A) 02/09/2017 0345   LABSPEC 1.038 (H) 02/09/2017 0345   PHURINE 5.0 02/09/2017 0345   GLUCOSEU NEGATIVE 02/09/2017 0345   HGBUR SMALL (A) 02/09/2017 0345   BILIRUBINUR NEGATIVE 02/09/2017 0345   KETONESUR 5 (A) 02/09/2017 0345   PROTEINUR 100 (A) 02/09/2017 0345   NITRITE NEGATIVE 02/09/2017 0345   LEUKOCYTESUR  NEGATIVE 02/09/2017 0345   Sepsis Labs Invalid input(s): PROCALCITONIN,  WBC,  LACTICIDVEN Microbiology Recent Results (from the past 240 hour(s))  Culture, blood (routine x 2)     Status: None (Preliminary result)   Collection Time: 02/05/17 11:02 PM  Result Value Ref Range Status   Specimen Description   Final    BLOOD LEFT ANTECUBITAL Performed at [facility], [place] [address], [place], [place] [address]    Special Requests   Final    BOTTLES DRAWN AEROBIC AND ANAEROBIC Blood Culture adequate volume Performed at [facility] [address], [place], [place] [address]    Culture   Final    NO GROWTH 4 DAYS Performed at [facility], [place] [address] [place], [place] [address]    Report Status PENDING  Incomplete  Culture, blood (routine x 2)     Status: Abnormal   Collection Time: 02/05/17 11:02 PM  Result Value Ref Range Status   Specimen Description   Final    BLOOD RIGHT ARM Performed at [facility] [address] [place], [place] [address]    Special Requests   Final    IN PEDIATRIC BOTTLE Blood Culture adequate volume Performed at [facility] [address], [place], [place] [address]    Culture  Setup Time   Final    GRAM POSITIVE COCCI IN PEDIATRIC BOTTLE CRITICAL RESULT CALLED TO, READ BACK BY AND VERIFIED WITH: B GREEN PHARMD 02/07/17 0127 JDW    Culture (A)  Final    STAPHYLOCOCCUS SPECIES (COAGULASE NEGATIVE) THE SIGNIFICANCE OF ISOLATING THIS ORGANISM FROM A SINGLE SET OF BLOOD CULTURES WHEN MULTIPLE SETS ARE DRAWN IS UNCERTAIN. PLEASE NOTIFY THE MICROBIOLOGY DEPARTMENT WITHIN ONE WEEK IF SPECIATION AND SENSITIVITIES ARE REQUIRED. Performed at [facility], [place] [address], [place],  [address]  Report Status 02/08/2017 FINAL  Final  Blood Culture ID Panel (Reflexed)     Status: Abnormal   Collection Time: 02/05/17 11:02 PM  Result Value Ref Range Status    Enterococcus species NOT DETECTED NOT DETECTED Final   Listeria monocytogenes NOT DETECTED NOT DETECTED Final   Staphylococcus species DETECTED (A) NOT DETECTED Final    Comment: Methicillin (oxacillin) susceptible coagulase negative staphylococcus. Possible blood culture contaminant (unless isolated from more than one blood culture draw or clinical case suggests pathogenicity). No antibiotic treatment is indicated for blood  culture contaminants. CRITICAL RESULT CALLED TO, READ BACK BY AND VERIFIED WITH: B GREEN PHARMD 02/07/17 0127 JDW    Staphylococcus aureus NOT DETECTED NOT DETECTED Final   Methicillin resistance NOT DETECTED NOT DETECTED Final   Streptococcus species NOT DETECTED NOT DETECTED Final   Streptococcus agalactiae NOT DETECTED NOT DETECTED Final   Streptococcus pneumoniae NOT DETECTED NOT DETECTED Final   Streptococcus pyogenes NOT DETECTED NOT DETECTED Final   Acinetobacter baumannii NOT DETECTED NOT DETECTED Final   Enterobacteriaceae species NOT DETECTED NOT DETECTED Final   Enterobacter cloacae complex NOT DETECTED NOT DETECTED Final   Escherichia coli NOT DETECTED NOT DETECTED Final   Klebsiella oxytoca NOT DETECTED NOT DETECTED Final   Klebsiella pneumoniae NOT DETECTED NOT DETECTED Final   Proteus species NOT DETECTED NOT DETECTED Final   Serratia marcescens NOT DETECTED NOT DETECTED Final   Carbapenem resistance NOT DETECTED NOT DETECTED Final   Haemophilus influenzae NOT DETECTED NOT DETECTED Final   Neisseria meningitidis NOT DETECTED NOT DETECTED Final   Pseudomonas aeruginosa NOT DETECTED NOT DETECTED Final   Candida albicans NOT DETECTED NOT DETECTED Final   Candida glabrata NOT DETECTED NOT DETECTED Final   Candida krusei NOT DETECTED NOT DETECTED Final   Candida parapsilosis NOT DETECTED NOT DETECTED Final   Candida tropicalis NOT DETECTED NOT DETECTED Final  Culture, blood (routine x 2)     Status: None (Preliminary result)   Collection Time: 02/07/17  11:17 AM  Result Value Ref Range Status   Specimen Description   Final    BLOOD LEFT ANTECUBITAL Performed at [facility], [place] [address] [place], [place] [address]    Special Requests   Final    BOTTLES DRAWN AEROBIC AND ANAEROBIC Blood Culture adequate volume Performed at [facility] [address], [place], [place] [address]    Culture   Final    NO GROWTH 3 DAYS Performed at [facility], [place] [address], [place], [place] [address]    Report Status PENDING  Incomplete  Culture, blood (routine x 2)     Status: None (Preliminary result)   Collection Time: 02/07/17 11:19 AM  Result Value Ref Range Status   Specimen Description   Final    BLOOD RIGHT ANTECUBITAL Performed at [facility] [address], [place], [place] [address]    Special Requests   Final    BOTTLES DRAWN AEROBIC AND ANAEROBIC Blood Culture adequate volume Performed at [facility] [address], [place], [place] [address]    Culture   Final    NO GROWTH 3 DAYS Performed at [facility], [place] [address], [place], [place] [address]    Report Status PENDING  Incomplete  Culture, Urine     Status: Abnormal   Collection Time: 02/09/17  3:45 AM  Result Value Ref Range Status   Specimen Description   Final    URINE, CLEAN  CATCH Performed at [facility], [place] [address], [place], [place] [address]    Special Requests   Final    NONE Performed at [facility], [place] [address] [place], [place] [address]    Culture MULTIPLE SPECIES PRESENT, SUGGEST RECOLLECTION (A)  Final   Report Status 02/10/2017 FINAL  Final     Time coordinating discharge: Over 30 minutes  SIGNED:   Marene Lenz, MD  [facility] 02/10/2017, 2:32 PM Pager [phone]  If 7PM-7AM, please contact night-coverage [email or web address] Password [email or web address]

## 2017-02-10 NOTE — Progress Notes (Signed)
[name]  HEMATOLOGY ONCOLOGY INPATIENT PROGRESS NOTE  Date of service: 02/08/17 Patient Care Team: Deland Pretty, MD as PCP - General (Internal Medicine)  CC: post hospitalization f/u for management of newly diagnosed diffuse large B cell lymphoma  Diagnosis: Diffuse large B cell lymphoma  Current Treatment: R-CHOP with G-CSF support s/p R-CHOP x 6 cycles.  HISTORY OF PRESENT ILLNESS: please see original clinic note for HPI  INTERVAL HISTORY:  Jo Collins notes pain controlled a little better with pain medications. Has BM Bx this AM - tolerated well. Seen by orthopedics - recommended outpatient PT. Had an episode of her P afib and was transferred to tele and placed on cardizem GTT and reverted back to NSR. She really prefers to be be treated as outpatient for hre lymphoma. I discussed with [facility] and referral was initiated for consideration of Car-T cell therapy. We discussed inpatient R-ICE vs outpatient GCD and prefers the latter. Got her scheduled as outpatient to start rx with GCD on 02/14/2017. No fevers/chills or other focal symptoms of infection. Platelets lower ? Lymphoma  ? Abx ? Infection.?lovenox  REVIEW OF SYSTEMS:    A 10+ POINT REVIEW OF SYSTEMS WAS OBTAINED including neurology, dermatology, psychiatry, cardiac, respiratory, lymph, extremities, GI, GU, Musculoskeletal, constitutional, breasts, reproductive, HEENT.  All pertinent positives are noted in the HPI.  All others are negative.  . Past Medical History:  Diagnosis Date  . Abdominal pain, RUQ   . Abnormal EKG 08/07/2016  . Acute midline thoracic back pain   . Acute respiratory distress 09/25/2016  . Anemia 08/07/2016  . Chronic low back pain 09/25/2016  . Cough   . DLBCL (diffuse large B cell lymphoma) ([place]) 08/17/2016  . GERD (gastroesophageal reflux disease)   . History of chemotherapy   . Hypokalemia 09/25/2016  . 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 Lymphadenopathy   . Morbid obesity with BMI of 50.0-59.9, adult ([place])   .  Neutrophilic leukocytosis 09/04/2353  . NHL (non-Hodgkin's lymphoma) ([place])   . Pleural effusion on right 08/07/2016  . Recurrent pleural effusion on right 10/08/2016  . Recurrent right pleural effusion 09/25/2016  . S/P thoracentesis   . Shortness of breath 08/07/2016  . SOB (shortness of breath) 09/26/2016    Past Surgical History:  Procedure Laterality Date  . ABDOMINAL HYSTERECTOMY    . IR CV LINE INJECTION  10/26/2016  . IR FLUORO GUIDE PORT INSERTION RIGHT  08/18/2016  . IR FLUORO GUIDE PORT INSERTION RIGHT  11/03/2016  . IR REMOVAL TUN CV CATH W/O FL  11/03/2016  . IR THORACENTESIS ASP PLEURAL SPACE W/IMG GUIDE  08/07/2016  . IR THORACENTESIS ASP PLEURAL SPACE W/IMG GUIDE  08/14/2016  . IR US GUIDE VASC ACCESS RIGHT  08/18/2016  . IR US GUIDE VASC ACCESS RIGHT  11/03/2016    . Social History   Tobacco Use  . Smoking status: Former Smoker    Packs/day: 0.50    Years: 47.00    Pack years: 23.50    Types: Cigarettes    Last attempt to quit: 07/28/2016    Years since quitting: 0.5  . Smokeless tobacco: Never Used  Substance Use Topics  . Alcohol use: No  . Drug use: No    ALLERGIES:  has No Known Allergies.  MEDICATIONS:  Current Facility-Administered Medications  Medication Dose Route Frequency Provider Last Rate Last Dose  . acetaminophen (TYLENOL) tablet 650 mg  650 mg Oral Q4H PRN Schorr, Rhetta Mura, NP   650 mg at 02/08/17 1433  .  aspirin EC tablet 81 mg  81 mg Oral Daily Emokpae, Ejiroghene E, MD   81 mg at 02/09/17 1020  . cephALEXin (KEFLEX) capsule 500 mg  500 mg Oral Q6H Jodie Echevaria, Amrit, MD   500 mg at 02/10/17 0555  . dexamethasone (DECADRON) injection 8 mg  8 mg Intravenous Q24H Brunetta Genera, MD   8 mg at 02/09/17 1442  . DULoxetine (CYMBALTA) DR capsule 30 mg  30 mg Oral Daily Brunetta Genera, MD   30 mg at 02/09/17 1020  . gabapentin (NEURONTIN) capsule 300 mg  300 mg Oral TID Mcarthur Rossetti, MD   300 mg at 02/09/17 2152  . HYDROmorphone  (DILAUDID) injection 0.2-0.4 mg  0.2-0.4 mg Intravenous Q4H PRN Brunetta Genera, MD   0.4 mg at 02/08/17 2017  . oxyCODONE-acetaminophen (PERCOCET/ROXICET) 5-325 MG per tablet 1-2 tablet  1-2 tablet Oral Q4H PRN Brunetta Genera, MD   2 tablet at 02/10/17 0603    PHYSICAL EXAMINATION: ECOG PERFORMANCE STATUS: 1 - Symptomatic but completely ambulatory  . Vitals:   02/09/17 2124 02/10/17 0603  BP: 120/82 124/71  Pulse: 88 72  Resp: 18 18  Temp: 99.7 F (37.6 C) 98.1 F (36.7 C)  SpO2: 100% 99%    Filed Weights   02/08/17 0244  Weight: 253 lb 8.5 oz (115 kg)   .Body mass index is 47.9 kg/m.  GENERAL:alert, in no acute distress and comfortable SKIN: no acute rashes, no significant lesions EYES: conjunctiva are pink and non-injected, sclera anicteric OROPHARYNX: MMM, no exudates, no oropharyngeal erythema or ulceration NECK: supple, no JVD LYMPH:  no palpable lymphadenopathy in the cervical, axillary or inguinal regions LUNGS: distant breath sounds. Decreased AE rt base about 1/4 up posterior chest wall. HEART: S1/S2 irregular rate & rhythm ABDOMEN:  normoactive bowel sounds , non tender, not distended. Extremity: grade 1 pedal edema PSYCH: alert & oriented x 3 with fluent speech NEURO: no focal motor/sensory deficits   LABORATORY DATA:   I have reviewed the data as listed  . CBC Latest Ref Rng & Units 02/10/2017 02/09/2017 02/08/2017  WBC 4.0 - 10.5 K/uL 15.1(H) 15.5(H) 17.7(H)  Hemoglobin 12.0 - 15.0 g/dL 8.4(L) 8.7(L) 8.8(L)  Hematocrit 36.0 - 46.0 % 25.7(L) 27.3(L) 28.3(L)  Platelets 150 - 400 K/uL 44(L) 68(L) 91(L)    . CMP Latest Ref Rng & Units 02/07/2017 02/06/2017 02/05/2017  Glucose 65 - 99 mg/dL 106(H) 100(H) 94  BUN 6 - 20 mg/dL 13 12 12   Creatinine 0.44 - 1.00 mg/dL 0.93 0.87 1.02  Sodium 135 - 145 mmol/L 135 138 140  Potassium 3.5 - 5.1 mmol/L 3.5 3.0(L) 3.5  Chloride 101 - 111 mmol/L 103 104 105  CO2 22 - 32 mmol/L 24 24 22   Calcium 8.9 - 10.3 mg/dL  8.6(L) 8.7(L) 9.4  Total Protein 6.4 - 8.3 g/dL - - 6.8  Total Bilirubin 0.2 - 1.2 mg/dL - - 0.5  Alkaline Phos 40 - 150 U/L - - 63  AST 5 - 34 U/L - - 37(H)  ALT 0 - 55 U/L - - 14   LDH 1900's   RADIOGRAPHIC STUDIES: I have personally reviewed the radiological images as listed and agreed with the findings in the report. Dg Tibia/fibula Left  Result Date: 02/06/2017 CLINICAL DATA:  Leg pain.  History of lymphoma EXAM: LEFT TIBIA AND FIBULA - 2 VIEW COMPARISON:  None. FINDINGS: There is no evidence of fracture or other focal bone lesions. Soft tissues are unremarkable. IMPRESSION:  Negative. Electronically Signed   By: Franchot Gallo M.D.   On: 02/06/2017 08:48   Ct Chest W Contrast  Result Date: 02/06/2017 CLINICAL DATA:  H/o diffuse large B cell lymphoma dx'd 9/18, last chemo 12/18 Lt groin, leg, foot pain & numbness since 1/19 Recurrent rt pleural effusion EXAM: CT CHEST, ABDOMEN, AND PELVIS WITH CONTRAST TECHNIQUE: Multidetector CT imaging of the chest, abdomen and pelvis was performed following the standard protocol during bolus administration of intravenous contrast. CONTRAST:  100 mL of Isovue-300 intravenous contrast COMPARISON:  PET-CT, 01/05/2017 and 10/21/2016. FINDINGS: CT CHEST FINDINGS Cardiovascular: Heart is normal in size. No pericardial effusion. No significant coronary artery calcifications. Aorta is normal in caliber. No aortic dissection or atherosclerosis. Mild prominence of the main pulmonary artery measuring 3.5 cm. Mediastinum/Nodes: Ill-defined low-attenuation soft tissue lies adjacent to the lower thoracic spine vertebra and contacts the descending thoracic aorta, without convincing change most recent PET-CT. No discrete mediastinal or hilar masses and no pathologically enlarged lymph nodes. Trachea is widely patent. Esophagus is unremarkable. No neck base or axillary masses or adenopathy. Lungs/Pleura: Small right pleural effusion. Opacity the anterolateral right upper lobe  and in the right middle lobe, consistent with atelectasis, is stable from the prior PET-CT. Some pleural based opacity along the anterior right upper lobe more superiorly has mildly improved. Mild pleural base parenchymal opacity is noted in the right lower lobe, most likely atelectasis, mildly improved from the PET-CT. Left lung is clear. No left pleural effusion. No pneumothorax. Musculoskeletal: There is thickening of the right anterior inferior chest wall musculature extending superiorly from the right sixth rib costal cartilage to the right eighth rib costal cartilage. Adjacent to this, at the level of the eighth rib costal cartilage, there is a 14 mm irregular soft tissue nodule. These findings are new from the recent PET-CT. The muscular thickening measures 2.3 cm, anterior to posterior. No osteoblastic or osteolytic lesions. No fracture or acute finding. CT ABDOMEN PELVIS FINDINGS Hepatobiliary: Liver mildly enlarged, right lobe measuring 2.4 cm from superior inferior. No liver mass or focal lesion. There is irregular wall thickening of the gallbladder most evident along the fundus focally. No evidence of gallstones. No pericholecystic inflammation. No bile duct dilation. Pancreas: Unremarkable. No pancreatic ductal dilatation or surrounding inflammatory changes. Spleen: Spleen borderline enlarged measuring 11.5 x 5.5 x 11.8 cm, without significant change when compared to the most recent prior PET-CT. No splenic mass or focal lesion. Adrenals/Urinary Tract: No adrenal masses. Subcentimeter lower pole cyst arises from the left kidney. No other renal masses, no stones and no hydronephrosis. Symmetric renal enhancement and excretion. Normal ureters. Bladder is unremarkable. Stomach/Bowel: No stomach, small bowel or colonic mass. No mesenteric masses. Bowel is normal in caliber. Stomach and small bowel are unremarkable. There are multiple left colon diverticula. No diverticulitis or other colonic inflammatory  process. Normal appendix is visualized. Vascular/Lymphatic: Enlarged obturator node on the left surrounds a left internal iliac artery branch. This measures 3.8 x 3.6 cm, increased from 2.7 x 2.0 cm on the prior PET-CT. There is an adjacent left external iliac chain lymph node measuring 17 mm in short axis, stable from the PET-CT. There are 2 adjacent right external iliac chain lymph nodes, largest measuring 2.8 x 1.8 cm, also without change. No other enlarged lymph nodes. Aorta is normal in caliber. No atherosclerosis. No significant vascular abnormality. Reproductive: Status post hysterectomy. No adnexal masses. Other: No abdominal wall hernia. There is a focus of increased attenuation in the subcutaneous fat  of the right upper abdomen wall which may be from a soft tissue injection. This does not appear to be a discrete mass. Calcified soft tissue granuloma are noted in the left lateral upper chest wall subcutaneous fat. No ascites. Musculoskeletal: No osteoblastic or osteolytic lesions. IMPRESSION: 1. There is a new chest wall mass involving the musculature of the right, anterior inferior chest wall, spanning from approximately the right sixth through the right eighth rib cartilages. There is a new adjacent subcutaneous soft tissue mass measuring 14 mm. Findings are consistent with new areas of soft tissue lymphoma. 2. In the pelvis, there is enlargement of a left obturator lymph node when compared to the recent PET-CT, consistent with progression lymphoma. 3. Previously described soft tissue along the descending thoracic aorta is without significant change from the most recent prior CT. 4. Small right pleural effusion and areas right lung pleural based opacity are similar to the PET-CT, with subtle areas of improvement of some of the pleural-based right lung opacity. 5. There is mild irregular wall thickening noted along the gallbladder mostly at the fundus. This could be a benign etiology, specifically  adenomyomatosis. Infiltration of the gallbladder wall with lymphoma is possible. Consider further assessment with limited right upper quadrant ultrasound. 6. Mild hepatomegaly and borderline enlargement of the spleen is stable from the recent PET-CT. Electronically Signed   By: Lajean Manes M.D.   On: 02/06/2017 14:28   Mr Brain Wo Contrast  Result Date: 02/06/2017 CLINICAL DATA:  Numbness or tingling, paresthesia. Numbness involves the chin and left lower extremity. History of non-Hodgkin's lymphoma. EXAM: MRI HEAD WITHOUT CONTRAST TECHNIQUE: Multiplanar, multiecho pulse sequences of the brain and surrounding structures were obtained without intravenous contrast. COMPARISON:  None. FINDINGS: Brain: Incomplete study with diffusion, sagittal T1, axial FLAIR, axial T2, and axial gradient acquired. No acute infarct, blood products, hydrocephalus, or masslike finding. There is FLAIR hyperintensity about the atria of the lateral ventricles where there is mild apparent ependymal bulging towards the ventricles. This is likely due to slice angle given the symmetric appearance and lack of restricted diffusion in this area. These signal changes are usually seen with chronic small vessel ischemia. No suspected mass. Partially empty sella Vascular: Major flow voids are preserved. Skull and upper cervical spine: No evidence of marrow lesion. Sinuses/Orbits: Negative IMPRESSION: 1. Partial, noncontrast brain MRI due to patient discomfort. 2. No acute finding or explanation for symptoms. Electronically Signed   By: Monte Fantasia M.D.   On: 02/06/2017 13:18   Ct Abdomen Pelvis W Contrast  Result Date: 02/06/2017 CLINICAL DATA:  H/o diffuse large B cell lymphoma dx'd 9/18, last chemo 12/18 Lt groin, leg, foot pain & numbness since 1/19 Recurrent rt pleural effusion EXAM: CT CHEST, ABDOMEN, AND PELVIS WITH CONTRAST TECHNIQUE: Multidetector CT imaging of the chest, abdomen and pelvis was performed following the standard  protocol during bolus administration of intravenous contrast. CONTRAST:  100 mL of Isovue-300 intravenous contrast COMPARISON:  PET-CT, 01/05/2017 and 10/21/2016. FINDINGS: CT CHEST FINDINGS Cardiovascular: Heart is normal in size. No pericardial effusion. No significant coronary artery calcifications. Aorta is normal in caliber. No aortic dissection or atherosclerosis. Mild prominence of the main pulmonary artery measuring 3.5 cm. Mediastinum/Nodes: Ill-defined low-attenuation soft tissue lies adjacent to the lower thoracic spine vertebra and contacts the descending thoracic aorta, without convincing change most recent PET-CT. No discrete mediastinal or hilar masses and no pathologically enlarged lymph nodes. Trachea is widely patent. Esophagus is unremarkable. No neck base or axillary masses or  adenopathy. Lungs/Pleura: Small right pleural effusion. Opacity the anterolateral right upper lobe and in the right middle lobe, consistent with atelectasis, is stable from the prior PET-CT. Some pleural based opacity along the anterior right upper lobe more superiorly has mildly improved. Mild pleural base parenchymal opacity is noted in the right lower lobe, most likely atelectasis, mildly improved from the PET-CT. Left lung is clear. No left pleural effusion. No pneumothorax. Musculoskeletal: There is thickening of the right anterior inferior chest wall musculature extending superiorly from the right sixth rib costal cartilage to the right eighth rib costal cartilage. Adjacent to this, at the level of the eighth rib costal cartilage, there is a 14 mm irregular soft tissue nodule. These findings are new from the recent PET-CT. The muscular thickening measures 2.3 cm, anterior to posterior. No osteoblastic or osteolytic lesions. No fracture or acute finding. CT ABDOMEN PELVIS FINDINGS Hepatobiliary: Liver mildly enlarged, right lobe measuring 2.4 cm from superior inferior. No liver mass or focal lesion. There is irregular  wall thickening of the gallbladder most evident along the fundus focally. No evidence of gallstones. No pericholecystic inflammation. No bile duct dilation. Pancreas: Unremarkable. No pancreatic ductal dilatation or surrounding inflammatory changes. Spleen: Spleen borderline enlarged measuring 11.5 x 5.5 x 11.8 cm, without significant change when compared to the most recent prior PET-CT. No splenic mass or focal lesion. Adrenals/Urinary Tract: No adrenal masses. Subcentimeter lower pole cyst arises from the left kidney. No other renal masses, no stones and no hydronephrosis. Symmetric renal enhancement and excretion. Normal ureters. Bladder is unremarkable. Stomach/Bowel: No stomach, small bowel or colonic mass. No mesenteric masses. Bowel is normal in caliber. Stomach and small bowel are unremarkable. There are multiple left colon diverticula. No diverticulitis or other colonic inflammatory process. Normal appendix is visualized. Vascular/Lymphatic: Enlarged obturator node on the left surrounds a left internal iliac artery branch. This measures 3.8 x 3.6 cm, increased from 2.7 x 2.0 cm on the prior PET-CT. There is an adjacent left external iliac chain lymph node measuring 17 mm in short axis, stable from the PET-CT. There are 2 adjacent right external iliac chain lymph nodes, largest measuring 2.8 x 1.8 cm, also without change. No other enlarged lymph nodes. Aorta is normal in caliber. No atherosclerosis. No significant vascular abnormality. Reproductive: Status post hysterectomy. No adnexal masses. Other: No abdominal wall hernia. There is a focus of increased attenuation in the subcutaneous fat of the right upper abdomen wall which may be from a soft tissue injection. This does not appear to be a discrete mass. Calcified soft tissue granuloma are noted in the left lateral upper chest wall subcutaneous fat. No ascites. Musculoskeletal: No osteoblastic or osteolytic lesions. IMPRESSION: 1. There is a new chest  wall mass involving the musculature of the right, anterior inferior chest wall, spanning from approximately the right sixth through the right eighth rib cartilages. There is a new adjacent subcutaneous soft tissue mass measuring 14 mm. Findings are consistent with new areas of soft tissue lymphoma. 2. In the pelvis, there is enlargement of a left obturator lymph node when compared to the recent PET-CT, consistent with progression lymphoma. 3. Previously described soft tissue along the descending thoracic aorta is without significant change from the most recent prior CT. 4. Small right pleural effusion and areas right lung pleural based opacity are similar to the PET-CT, with subtle areas of improvement of some of the pleural-based right lung opacity. 5. There is mild irregular wall thickening noted along the gallbladder mostly at  the fundus. This could be a benign etiology, specifically adenomyomatosis. Infiltration of the gallbladder wall with lymphoma is possible. Consider further assessment with limited right upper quadrant ultrasound. 6. Mild hepatomegaly and borderline enlargement of the spleen is stable from the recent PET-CT. Electronically Signed   By: Lajean Manes M.D.   On: 02/06/2017 14:28   Ct Biopsy  Result Date: 01/23/2017 INDICATION: History of diffuse large B-cell lymphoma now with concern for recurrence. Please perform CT-guided biopsy of hypermetabolic right external iliac chain lymph node for tissue diagnostic purposes. EXAM: CT-GUIDED BIOPSY OF RIGHT EXTERNAL ILIAC CHAIN LYMPH NODE COMPARISON:  PET-CT - 01/05/2017 MEDICATIONS: None. ANESTHESIA/SEDATION: Fentanyl 100 mcg IV; Versed 2 mg IV Sedation time: 16 minutes; The patient was continuously monitored during the procedure by the interventional radiology nurse under my direct supervision. CONTRAST:  None. COMPLICATIONS: None immediate. PROCEDURE: Informed consent was obtained from the patient following an explanation of the procedure, risks,  benefits and alternatives. A time out was performed prior to the initiation of the procedure. The patient was positioned supine on the CT table and a limited CT was performed for procedural planning demonstrating unchanged size and appearance of known hypermetabolic right external iliac chain nodal conglomeration with dominant component measuring approximately 3.9 x 1.9 cm (image 18, series 3. The procedure was planned. The operative site was prepped and draped in the usual sterile fashion. Appropriate trajectory was confirmed with a 22 gauge spinal needle after the adjacent tissues were anesthetized with 1% Lidocaine with epinephrine. Under intermittent CT guidance, a 17 gauge coaxial needle was advanced into the peripheral aspect of the nodal conglomeration. Appropriate positioning was confirmed and 5 core needle biopsy samples were obtained with an 18 gauge core needle biopsy device. The co-axial needle was removed and hemostasis was achieved with manual compression. A limited postprocedural CT was negative for hemorrhage or additional complication. A dressing was placed. The patient tolerated the procedure well without immediate postprocedural complication. IMPRESSION: Technically successful CT guided core needle biopsy of hypermetabolic right external iliac chain nodal conglomeration. Electronically Signed   By: Sandi Mariscal M.D.   On: 01/23/2017 11:41   Vas [place] Lower Extremity Venous (dvt)  Result Date: 02/06/2017  Lower Venous Study Indication: Pain. Examination Guidelines: A complete evaluation includes B-mode imaging, spectral doppler, color doppler, and power doppler as needed of all accessible portions of each vessel. Bilateral testing is considered an integral part of a complete examination. Limited examinations for reoccurring indications may be performed as noted. The reflux portion of the exam is performed with the patient in reverse Trendelenburg.  Limitations: Body habitus.  Right Venous Findings:  +---+---------------+---------+-----------+----------+-------+    CompressibilityPhasicitySpontaneityPropertiesSummary +---+---------------+---------+-----------+----------+-------+ CFVFull           Yes      Yes                          +---+---------------+---------+-----------+----------+-------+  Left Venous Findings: +---------+---------------+---------+-----------+----------+-------+          CompressibilityPhasicitySpontaneityPropertiesSummary +---------+---------------+---------+-----------+----------+-------+ CFV      Full           Yes      Yes                          +---------+---------------+---------+-----------+----------+-------+ FV Prox  Full                                                 +---------+---------------+---------+-----------+----------+-------+  FV Mid   Full                                                 +---------+---------------+---------+-----------+----------+-------+ FV DistalFull                                                 +---------+---------------+---------+-----------+----------+-------+ PFV      Full                                                 +---------+---------------+---------+-----------+----------+-------+ POP      Full           Yes      Yes                          +---------+---------------+---------+-----------+----------+-------+ PTV      Full                                                 +---------+---------------+---------+-----------+----------+-------+ PERO     Full                                                 +---------+---------------+---------+-----------+----------+-------+    Final Interpretation: Right: No evidence of common femoral vein obstruction. Left: There is no evidence of deep vein thrombosis in the lower extremity.There is no evidence of superficial venous thrombosis. No cystic structure found in the popliteal fossa.  *See table(s) above for measurements and  observations. Electronically signed by Harold Barban on 02/06/2017 at 10:36:16 AM.   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  -PET scan skull base to thigh on 10/21/2016 with results revealing: IMPRESSION:1. There has been significant interval improvement in the chest, abdomen, and pelvis in the interval. The dominant mass in the chest is much smaller and less FDG avid in the interval. However, the remaining maximum uptake is slightly greater than mediastinal blood pool. The dominant remaining disease in the pelvis is an external iliac lymph node with a maximum SUV moderately greater than liver blood pool. Evaluation of the bones is more difficult today given the diffuse uptake likely associated with recent chemotherapy. I suspect the previously identified humeral lesion remains. Recommend continued attention to the bones on follow-up. [place]ville Category 4  -PET scan skull base to thigh on 01/05/2017 r after 6 cycles of R-CHOP and IT MTX revealing: IMPRESSION:1. Mixed findings on the PET-CT.  There are definite areas of significant continued improvement but also some new areas of involvement and slightly progressive pelvic adenopathy as detailed above. 2. Near complete resolution of the right pleural effusion. 3. Much improved osseous disease.  #3 Coag neg Staph Sepsis with borderline elevated procalcitonin levels. Decreased leucocytosis to 15k ?true infection vs contaminant.  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 -would recommend transfusion of 1 unit of PRBC to keep hgb >9 in light of upcoming chemotherapy -transfuse platelets prn for PLT<20k -start on Dexamethasone 8mg  po daily for thrombocytopenia and pending chemotherapy. Might help her back as well. -could discharge over weekend if stable issues with atrial fibrillation, after transfusion, if afebrile and able to ambulate independently.  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 Advanced facet degenerative disease at this level results in 0.3 cm anterolisthesis.  Right worse than left facet arthropathy at L5-S1 with an associated right facet joint effusion. Facet arthropathy and disc cause mild right foraminal narrowing at this level. The central canal and left foramen are open.  PLAN  -Spine MRI shows lots of disk disease and nerve pinching but no lymphoma. Discussed that as soon as her infection subsides we can begin treating her lymphoma.  -The pt reports continuing, significant pain in her legs. We will adjust her current percocet from 1 pill every 6 hours to 1-2 pills every 4 hours. PT evaluation.  -will need outpatient PT -changed gabapentin to Cymbalta and would increase dose in a few days if tolerated.  -prn IV dialudid. -monitor for excessive sedation given concern for sleep apnea  Discharge planning: -could discharge over weekend if stable issues with atrial fibrillation, after transfusion, if afebrile and able to ambulate independently. -outpatient GCD starting on 02/14/2017.  I spent 30 minutes counseling the patient face to face. The total time spent in the  appointment was 35 minutes and more than 50% was on counseling and direct patient cares.    Sullivan Lone MD [place] AAHIVMS [facility] Hematology/Oncology Physician [facility]  (Office):       [phone] (Work cell):  [phone] (Fax):           [phone]  This document serves as a record of services personally performed by Sullivan Lone, MD. It was created on his behalf by Baldwin Jamaica, a trained medical scribe. The creation of this record is based on the scribe's personal observations and the provider's statements to them.   .I have reviewed the above documentation for accuracy and completeness, and I agree with the above.] Sullivan Lone MD MS

## 2017-02-11 LAB — CULTURE, BLOOD (ROUTINE X 2)
Culture: NO GROWTH
SPECIAL REQUESTS: ADEQUATE

## 2017-02-12 LAB — CULTURE, BLOOD (ROUTINE X 2)
CULTURE: NO GROWTH
CULTURE: NO GROWTH
SPECIAL REQUESTS: ADEQUATE
Special Requests: ADEQUATE

## 2017-02-13 ENCOUNTER — Telehealth: Payer: Self-pay | Admitting: Hematology

## 2017-02-13 NOTE — Telephone Encounter (Signed)
Scheduled appts per 2/8 sch msg - left voicemail for patient regarding appts

## 2017-02-14 ENCOUNTER — Inpatient Hospital Stay: Payer: Medicare Other

## 2017-02-14 ENCOUNTER — Other Ambulatory Visit: Payer: Self-pay | Admitting: *Deleted

## 2017-02-14 VITALS — BP 105/63 | HR 71 | Temp 98.4°F | Resp 18 | Ht 61.0 in | Wt 234.0 lb

## 2017-02-14 DIAGNOSIS — E876 Hypokalemia: Secondary | ICD-10-CM | POA: Diagnosis not present

## 2017-02-14 DIAGNOSIS — M5431 Sciatica, right side: Secondary | ICD-10-CM

## 2017-02-14 DIAGNOSIS — Z5111 Encounter for antineoplastic chemotherapy: Secondary | ICD-10-CM | POA: Diagnosis present

## 2017-02-14 DIAGNOSIS — C8338 Diffuse large B-cell lymphoma, lymph nodes of multiple sites: Secondary | ICD-10-CM

## 2017-02-14 DIAGNOSIS — I480 Paroxysmal atrial fibrillation: Secondary | ICD-10-CM | POA: Diagnosis not present

## 2017-02-14 DIAGNOSIS — K219 Gastro-esophageal reflux disease without esophagitis: Secondary | ICD-10-CM | POA: Diagnosis not present

## 2017-02-14 DIAGNOSIS — M549 Dorsalgia, unspecified: Secondary | ICD-10-CM | POA: Diagnosis not present

## 2017-02-14 DIAGNOSIS — Z6841 Body Mass Index (BMI) 40.0 and over, adult: Secondary | ICD-10-CM | POA: Diagnosis not present

## 2017-02-14 DIAGNOSIS — Z79899 Other long term (current) drug therapy: Secondary | ICD-10-CM | POA: Diagnosis not present

## 2017-02-14 DIAGNOSIS — C8228 Follicular lymphoma grade III, unspecified, lymph nodes of multiple sites: Secondary | ICD-10-CM

## 2017-02-14 DIAGNOSIS — D72829 Elevated white blood cell count, unspecified: Secondary | ICD-10-CM | POA: Diagnosis not present

## 2017-02-14 DIAGNOSIS — R200 Anesthesia of skin: Secondary | ICD-10-CM | POA: Diagnosis not present

## 2017-02-14 DIAGNOSIS — M5386 Other specified dorsopathies, lumbar region: Secondary | ICD-10-CM

## 2017-02-14 DIAGNOSIS — Z7689 Persons encountering health services in other specified circumstances: Secondary | ICD-10-CM | POA: Diagnosis not present

## 2017-02-14 DIAGNOSIS — Z87891 Personal history of nicotine dependence: Secondary | ICD-10-CM | POA: Diagnosis not present

## 2017-02-14 LAB — CBC WITH DIFFERENTIAL (CANCER CENTER ONLY)
BASOS ABS: 0.3 10*3/uL — AB (ref 0.0–0.1)
Basophils Relative: 3 %
EOS PCT: 2 %
Eosinophils Absolute: 0.2 10*3/uL (ref 0.0–0.5)
HEMATOCRIT: 28.1 % — AB (ref 34.8–46.6)
Hemoglobin: 8.7 g/dL — ABNORMAL LOW (ref 11.6–15.9)
LYMPHS ABS: 0.9 10*3/uL (ref 0.9–3.3)
LYMPHS PCT: 9 %
MCH: 26.6 pg (ref 25.1–34.0)
MCHC: 31 g/dL — ABNORMAL LOW (ref 31.5–36.0)
MCV: 85.9 fL (ref 79.5–101.0)
Monocytes Absolute: 1.2 10*3/uL — ABNORMAL HIGH (ref 0.1–0.9)
Monocytes Relative: 12 %
Neutro Abs: 6.9 10*3/uL — ABNORMAL HIGH (ref 1.5–6.5)
Neutrophils Relative %: 74 %
Platelet Count: 40 10*3/uL — ABNORMAL LOW (ref 145–400)
RBC: 3.27 MIL/uL — ABNORMAL LOW (ref 3.70–5.45)
RDW: 16.9 % — AB (ref 11.2–14.5)
WBC Count: 9.3 10*3/uL (ref 3.9–10.3)
nRBC: 2 /100 WBC — ABNORMAL HIGH

## 2017-02-14 LAB — CMP (CANCER CENTER ONLY)
ALT: 17 U/L (ref 0–55)
AST: 35 U/L — ABNORMAL HIGH (ref 5–34)
Albumin: 2.9 g/dL — ABNORMAL LOW (ref 3.5–5.0)
Alkaline Phosphatase: 66 U/L (ref 40–150)
Anion gap: 14 — ABNORMAL HIGH (ref 3–11)
BUN: 18 mg/dL (ref 7–26)
CHLORIDE: 105 mmol/L (ref 98–109)
CO2: 21 mmol/L — ABNORMAL LOW (ref 22–29)
CREATININE: 0.85 mg/dL (ref 0.60–1.10)
Calcium: 8.9 mg/dL (ref 8.4–10.4)
GFR, Est AFR Am: >60 mL/min (ref >60)
GFR, Estimated: >60 mL/min (ref >60)
Glucose, Bld: 88 mg/dL (ref 70–140)
Potassium: 3.2 mmol/L — ABNORMAL LOW (ref 3.5–5.1)
SODIUM: 140 mmol/L (ref 136–145)
Total Bilirubin: 0.6 mg/dL (ref 0.2–1.2)
Total Protein: 5.9 g/dL — ABNORMAL LOW (ref 6.4–8.3)

## 2017-02-14 LAB — RETICULOCYTES
RBC.: 3.27 MIL/uL — ABNORMAL LOW (ref 3.70–5.45)
RETIC COUNT ABSOLUTE: 65.4 10*3/uL (ref 33.7–90.7)
Retic Ct Pct: 2 % (ref 0.7–2.1)

## 2017-02-14 LAB — LACTATE DEHYDROGENASE: LDH: 2910 U/L — AB (ref 125–245)

## 2017-02-14 MED ORDER — SODIUM CHLORIDE 0.9 % IV SOLN
634.0000 mg | Freq: Once | INTRAVENOUS | Status: AC
  Administered 2017-02-14: 630 mg via INTRAVENOUS
  Filled 2017-02-14: qty 63

## 2017-02-14 MED ORDER — ALLOPURINOL 100 MG PO TABS: 100.0000 mg | ORAL_TABLET | Freq: Every day | ORAL | 0 refills | Status: DC

## 2017-02-14 MED ORDER — PALONOSETRON HCL INJECTION 0.25 MG/5ML
0.2500 mg | Freq: Once | INTRAVENOUS | Status: AC
  Administered 2017-02-14: 0.25 mg via INTRAVENOUS

## 2017-02-14 MED ORDER — OXYCODONE-ACETAMINOPHEN 5-325 MG PO TABS
2.0000 | ORAL_TABLET | Freq: Once | ORAL | Status: AC
  Administered 2017-02-14: 2 via ORAL

## 2017-02-14 MED ORDER — DEXAMETHASONE SODIUM PHOSPHATE 10 MG/ML IJ SOLN
10.0000 mg | Freq: Once | INTRAMUSCULAR | Status: AC
  Administered 2017-02-14: 10 mg via INTRAVENOUS

## 2017-02-14 MED ORDER — SODIUM CHLORIDE 0.9 % IV SOLN
Freq: Once | INTRAVENOUS | Status: AC
  Administered 2017-02-14: 10:00:00 via INTRAVENOUS

## 2017-02-14 MED ORDER — HEPARIN SOD (PORK) LOCK FLUSH 100 UNIT/ML IV SOLN
500.0000 [IU] | Freq: Once | INTRAVENOUS | Status: AC | PRN
  Administered 2017-02-14: 500 [IU]
  Filled 2017-02-14: qty 5

## 2017-02-14 MED ORDER — PALONOSETRON HCL INJECTION 0.25 MG/5ML
INTRAVENOUS | Status: AC
  Filled 2017-02-14: qty 5

## 2017-02-14 MED ORDER — OXYCODONE-ACETAMINOPHEN 5-325 MG PO TABS
ORAL_TABLET | ORAL | Status: AC
Start: 2017-02-14 — End: ?
  Filled 2017-02-14: qty 2

## 2017-02-14 MED ORDER — DEXAMETHASONE SODIUM PHOSPHATE 10 MG/ML IJ SOLN
INTRAMUSCULAR | Status: AC
  Filled 2017-02-14: qty 1

## 2017-02-14 MED ORDER — SODIUM CHLORIDE 0.9 % IV SOLN: 10.0000 mg | Freq: Once | INTRAVENOUS | Status: DC

## 2017-02-14 MED ORDER — SODIUM CHLORIDE 0.9 % IV SOLN
2000.0000 mg | Freq: Once | INTRAVENOUS | Status: AC
  Administered 2017-02-14: 2000 mg via INTRAVENOUS
  Filled 2017-02-14: qty 52.6

## 2017-02-14 MED ORDER — SODIUM CHLORIDE 0.9% FLUSH
10.0000 mL | INTRAVENOUS | Status: DC | PRN
  Administered 2017-02-14: 10 mL
  Filled 2017-02-14: qty 10

## 2017-02-14 NOTE — Patient Instructions (Signed)
[place] Discharge Instructions for Patients Receiving Chemotherapy  Today you received the following chemotherapy agents: Gemcitabine (Gemzar) and Carboplatin (Paraplatin).  To help prevent nausea and vomiting after your treatment, we encourage you to take your nausea medication as prescribed. Received Aloxi during treatment-->take Compazine (not Zofran) for the next 3 days. If you develop nausea and vomiting that is not controlled by your nausea medication, call the clinic.   BELOW ARE SYMPTOMS THAT SHOULD BE REPORTED IMMEDIATELY:  *FEVER GREATER THAN 100.5 F  *CHILLS WITH OR WITHOUT FEVER  NAUSEA AND VOMITING THAT IS NOT CONTROLLED WITH YOUR NAUSEA MEDICATION  *UNUSUAL SHORTNESS OF BREATH  *UNUSUAL BRUISING OR BLEEDING  TENDERNESS IN MOUTH AND THROAT WITH OR WITHOUT PRESENCE OF ULCERS  *URINARY PROBLEMS  *BOWEL PROBLEMS  UNUSUAL RASH Items with * indicate a potential emergency and should be followed up as soon as possible.  Feel free to call the clinic should you have any questions or concerns. The clinic phone number is [phone].  Please show the [place] at check-in to the Emergency Department and triage nurse.  Gemcitabine injection What is this medicine? GEMCITABINE (jem SIT a been) is a chemotherapy drug. This medicine is used to treat many types of cancer like breast cancer, lung cancer, pancreatic cancer, and ovarian cancer. This medicine may be used for other purposes; ask your health care provider or pharmacist if you have questions. COMMON BRAND NAME(S): Gemzar What should I tell my health care provider before I take this medicine? They need to know if you have any of these conditions: -blood disorders -infection -kidney disease -liver disease -recent or ongoing radiation therapy -an unusual or allergic reaction to gemcitabine, other chemotherapy, other medicines, foods, dyes, or preservatives -pregnant or trying to get  pregnant -breast-feeding How should I use this medicine? This drug is given as an infusion into a vein. It is administered in a hospital or clinic by a specially trained health care professional. Talk to your pediatrician regarding the use of this medicine in children. Special care may be needed. Overdosage: If you think you have taken too much of this medicine contact a poison control center or emergency room at once. NOTE: This medicine is only for you. Do not share this medicine with others. What if I miss a dose? It is important not to miss your dose. Call your doctor or health care professional if you are unable to keep an appointment. What may interact with this medicine? -medicines to increase blood counts like filgrastim, pegfilgrastim, sargramostim -some other chemotherapy drugs like cisplatin -vaccines Talk to your doctor or health care professional before taking any of these medicines: -acetaminophen -aspirin -ibuprofen -ketoprofen -naproxen This list may not describe all possible interactions. Give your health care provider a list of all the medicines, herbs, non-prescription drugs, or dietary supplements you use. Also tell them if you smoke, drink alcohol, or use illegal drugs. Some items may interact with your medicine. What should I watch for while using this medicine? Visit your doctor for checks on your progress. This drug may make you feel generally unwell. This is not uncommon, as chemotherapy can affect healthy cells as well as cancer cells. Report any side effects. Continue your course of treatment even though you feel ill unless your doctor tells you to stop. In some cases, you may be given additional medicines to help with side effects. Follow all directions for their use. Call your doctor or health care professional for advice if you  get a fever, chills or sore throat, or other symptoms of a cold or flu. Do not treat yourself. This drug decreases your body's ability to  fight infections. Try to avoid being around people who are sick. This medicine may increase your risk to bruise or bleed. Call your doctor or health care professional if you notice any unusual bleeding. Be careful brushing and flossing your teeth or using a toothpick because you may get an infection or bleed more easily. If you have any dental work done, tell your dentist you are receiving this medicine. Avoid taking products that contain aspirin, acetaminophen, ibuprofen, naproxen, or ketoprofen unless instructed by your doctor. These medicines may hide a fever. Women should inform their doctor if they wish to become pregnant or think they might be pregnant. There is a potential for serious side effects to an unborn child. Talk to your health care professional or pharmacist for more information. Do not breast-feed an infant while taking this medicine. What side effects may I notice from receiving this medicine? Side effects that you should report to your doctor or health care professional as soon as possible: -allergic reactions like skin rash, itching or hives, swelling of the face, lips, or tongue -low blood counts - this medicine may decrease the number of white blood cells, red blood cells and platelets. You may be at increased risk for infections and bleeding. -signs of infection - fever or chills, cough, sore throat, pain or difficulty passing urine -signs of decreased platelets or bleeding - bruising, pinpoint red spots on the skin, black, tarry stools, blood in the urine -signs of decreased red blood cells - unusually weak or tired, fainting spells, lightheadedness -breathing problems -chest pain -mouth sores -nausea and vomiting -pain, swelling, redness at site where injected -pain, tingling, numbness in the hands or feet -stomach pain -swelling of ankles, feet, hands -unusual bleeding Side effects that usually do not require medical attention (report to your doctor or health care  professional if they continue or are bothersome): -constipation -diarrhea -hair loss -loss of appetite -stomach upset This list may not describe all possible side effects. Call your doctor for medical advice about side effects. You may report side effects to FDA at 1-800-FDA-1088. Where should I keep my medicine? This drug is given in a hospital or clinic and will not be stored at home. NOTE: This sheet is a summary. It may not cover all possible information. If you have questions about this medicine, talk to your doctor, pharmacist, or health care provider.  2018 Elsevier/Gold Standard (2007-04-30 18:45:54)  Carboplatin injection What is this medicine? CARBOPLATIN (KAR boe pla tin) is a chemotherapy drug. It targets fast dividing cells, like cancer cells, and causes these cells to die. This medicine is used to treat ovarian cancer and many other cancers. This medicine may be used for other purposes; ask your health care provider or pharmacist if you have questions. COMMON BRAND NAME(S): Paraplatin What should I tell my health care provider before I take this medicine? They need to know if you have any of these conditions: -blood disorders -hearing problems -kidney disease -recent or ongoing radiation therapy -an unusual or allergic reaction to carboplatin, cisplatin, other chemotherapy, other medicines, foods, dyes, or preservatives -pregnant or trying to get pregnant -breast-feeding How should I use this medicine? This drug is usually given as an infusion into a vein. It is administered in a hospital or clinic by a specially trained health care professional. Talk to your pediatrician regarding the  use of this medicine in children. Special care may be needed. Overdosage: If you think you have taken too much of this medicine contact a poison control center or emergency room at once. NOTE: This medicine is only for you. Do not share this medicine with others. What if I miss a dose? It  is important not to miss a dose. Call your doctor or health care professional if you are unable to keep an appointment. What may interact with this medicine? -medicines for seizures -medicines to increase blood counts like filgrastim, pegfilgrastim, sargramostim -some antibiotics like amikacin, gentamicin, neomycin, streptomycin, tobramycin -vaccines Talk to your doctor or health care professional before taking any of these medicines: -acetaminophen -aspirin -ibuprofen -ketoprofen -naproxen This list may not describe all possible interactions. Give your health care provider a list of all the medicines, herbs, non-prescription drugs, or dietary supplements you use. Also tell them if you smoke, drink alcohol, or use illegal drugs. Some items may interact with your medicine. What should I watch for while using this medicine? Your condition will be monitored carefully while you are receiving this medicine. You will need important blood work done while you are taking this medicine. This drug may make you feel generally unwell. This is not uncommon, as chemotherapy can affect healthy cells as well as cancer cells. Report any side effects. Continue your course of treatment even though you feel ill unless your doctor tells you to stop. In some cases, you may be given additional medicines to help with side effects. Follow all directions for their use. Call your doctor or health care professional for advice if you get a fever, chills or sore throat, or other symptoms of a cold or flu. Do not treat yourself. This drug decreases your body's ability to fight infections. Try to avoid being around people who are sick. This medicine may increase your risk to bruise or bleed. Call your doctor or health care professional if you notice any unusual bleeding. Be careful brushing and flossing your teeth or using a toothpick because you may get an infection or bleed more easily. If you have any dental work done, tell  your dentist you are receiving this medicine. Avoid taking products that contain aspirin, acetaminophen, ibuprofen, naproxen, or ketoprofen unless instructed by your doctor. These medicines may hide a fever. Do not become pregnant while taking this medicine. Women should inform their doctor if they wish to become pregnant or think they might be pregnant. There is a potential for serious side effects to an unborn child. Talk to your health care professional or pharmacist for more information. Do not breast-feed an infant while taking this medicine. What side effects may I notice from receiving this medicine? Side effects that you should report to your doctor or health care professional as soon as possible: -allergic reactions like skin rash, itching or hives, swelling of the face, lips, or tongue -signs of infection - fever or chills, cough, sore throat, pain or difficulty passing urine -signs of decreased platelets or bleeding - bruising, pinpoint red spots on the skin, black, tarry stools, nosebleeds -signs of decreased red blood cells - unusually weak or tired, fainting spells, lightheadedness -breathing problems -changes in hearing -changes in vision -chest pain -high blood pressure -low blood counts - This drug may decrease the number of white blood cells, red blood cells and platelets. You may be at increased risk for infections and bleeding. -nausea and vomiting -pain, swelling, redness or irritation at the injection site -pain, tingling, numbness  in the hands or feet -problems with balance, talking, walking -trouble passing urine or change in the amount of urine Side effects that usually do not require medical attention (report to your doctor or health care professional if they continue or are bothersome): -hair loss -loss of appetite -metallic taste in the mouth or changes in taste This list may not describe all possible side effects. Call your doctor for medical advice about side  effects. You may report side effects to FDA at 1-800-FDA-1088. Where should I keep my medicine? This drug is given in a hospital or clinic and will not be stored at home. NOTE: This sheet is a summary. It may not cover all possible information. If you have questions about this medicine, talk to your doctor, pharmacist, or health care provider.  2018 Elsevier/Gold Standard (2007-03-26 14:38:05)

## 2017-02-14 NOTE — Progress Notes (Signed)
Per Dr. Irene Limbo: OK to treat with platelets of 40. Also instructed pt and her spouse that Dr. Irene Limbo is writing her a prescription for Allopurinol and that she needs to pick it up today. Both verbalized understaning and agreement.

## 2017-02-14 NOTE — Progress Notes (Signed)
Pt c/o 10/10 pain down her left leg d/t sciatica flare. Gave pt hot packs, helped reposition, and massaged hip, but none provided relief. Updated Dr. Irene Limbo, and received orders to give pt 2, 5-325 mg of Percocet PO. Orders placed and medication administered. Will continue to monitor

## 2017-02-15 ENCOUNTER — Telehealth: Payer: Self-pay | Admitting: *Deleted

## 2017-02-15 NOTE — Telephone Encounter (Signed)
"  I received chemotherapy yesterday.  No Neulasta injection has been scheduled for today.  Has injection been discontinued?"  Injection not needed today.  Reviewed treatment cycle and injection to be received at the end of each cycle.

## 2017-02-19 NOTE — Progress Notes (Signed)
Jo Kitchen  HEMATOLOGY ONCOLOGY INPATIENT PROGRESS NOTE  Date of service: 02/21/17 Patient Care Team: Deland Pretty, MD as PCP - General (Internal Medicine)  CC:  F/u for relapsed/refractory DLBCL  Diagnosis: Diffuse large B cell lymphoma  Current Treatment: R-CHOP with G-CSF support s/p R-CHOP x 6 cycles.  HISTORY OF PRESENT ILLNESS: please see original clinic note for HPI  INTERVAL HISTORY: Jo Collins returns today regarding her Relapsed Diffuse Large B Cell Lymphoma. We last saw Jo Collins as an inpatient on 02/09/17. She is accompanied today by her sister. The pt reports that she was restarted on Gabapentin. She notes that she needs a refill for her Decadron. She notes that her new chemotherapy (GCD) treatment is not troublesome. She notes that her back and leg pain is still significant and she is now using a wheelchair, and occasionally a cane. She reports that her left foot and ankle have some numbness and she describes that it feels clumsy. She reports that her chin numbness persists. She notes some drowsiness that she associates with Neurontin.   She reports that she has significant worries about whether her body can take more aggressive chemotherapy, but that she does want the best treatment.   Of note since the patient last visit, pt has had BM Bx completed on 02/09/17 with results revealing HYPERCELLULAR BONE MARROW WITH EXTENSIVE INVOLVEMENT BY HIGH GRADE B-CELL LYMPHOMA. PERIPHERAL BLOOD: - NORMOCYTIC-NORMOCHROMIC ANEMIA. - LEUKOCYTOSIS WITH NEUTROPHILIC LEFT SHIFT. - THROMBOCYTOPENIA.  The pt also had a BM Flow Cytometry on 02/09/17 revealing - B-CELL LYMPHOPROLIFERATIVE PROCESS.   Lab results today (02/21/17) of CBC, CMP, and Reticulocytes is as follows: all values are WNL except for CO2 at 21 and Total Protein at 5.9, Albumin at 3.0, WBC at 0.6k, RBC at 2.99, Hgb at 7.9, HCT at 25.0, RDW at 16.1, Platelets at 25k, Neutro Abs at 0.3k, Lymphs Abs at 0.3k, and Monocytes Abs at 0. LDH on  02/21/17 is at 1107.  On review of systems, pt reports back pain, leg pain, some drowsiness, lightheadedness, and denies headaches, nausea, vomiting, problems sleeping, and any other symptoms.   Patient has several questions again regarding her prognosis and we re-discussed several aspects of her treatment plan.  REVIEW OF SYSTEMS:    10 Point review of Systems was done is negative except as noted above.  . Past Medical History:  Diagnosis Date  . Abdominal pain, RUQ   . Abnormal EKG 08/07/2016  . Acute midline thoracic back pain   . Acute respiratory distress 09/25/2016  . Anemia 08/07/2016  . Chronic low back pain 09/25/2016  . Cough   . DLBCL (diffuse large B cell lymphoma) ([place]) 08/17/2016  . GERD (gastroesophageal reflux disease)   . History of chemotherapy   . Hypokalemia 09/25/2016  . 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 Lymphadenopathy   . Morbid obesity with BMI of 50.0-59.9, adult ([place])   . Neutrophilic leukocytosis 60/01/930  . NHL (non-Hodgkin's lymphoma) ([place])   . Pleural effusion on right 08/07/2016  . Recurrent pleural effusion on right 10/08/2016  . Recurrent right pleural effusion 09/25/2016  . S/P thoracentesis   . Shortness of breath 08/07/2016  . SOB (shortness of breath) 09/26/2016    Past Surgical History:  Procedure Laterality Date  . ABDOMINAL HYSTERECTOMY    . IR CV LINE INJECTION  10/26/2016  . IR FLUORO GUIDE PORT INSERTION RIGHT  08/18/2016  . IR FLUORO GUIDE PORT INSERTION RIGHT  11/03/2016  . IR REMOVAL TUN CV CATH W/O FL  11/03/2016  . IR THORACENTESIS ASP PLEURAL SPACE W/IMG GUIDE  08/07/2016  . IR THORACENTESIS ASP PLEURAL SPACE W/IMG GUIDE  08/14/2016  . IR US GUIDE VASC ACCESS RIGHT  08/18/2016  . IR US GUIDE VASC ACCESS RIGHT  11/03/2016    . Social History   Tobacco Use  . Smoking status: Former Smoker    Packs/day: 0.50    Years: 47.00    Pack years: 23.50    Types: Cigarettes    Last attempt to quit: 07/28/2016    Years since quitting: 0.5  .  Smokeless tobacco: Never Used  Substance Use Topics  . Alcohol use: No  . Drug use: No    ALLERGIES:  has No Known Allergies.  MEDICATIONS:  Current Outpatient Medications  Medication Sig Dispense Refill  . acetaminophen (TYLENOL) 500 MG tablet Take 1,000 mg by mouth every 6 (six) hours as needed for moderate pain.    Jo Kitchen allopurinol (ZYLOPRIM) 100 MG tablet Take 1 tablet (100 mg total) by mouth daily. 14 tablet 0  . aspirin EC 81 MG tablet Take 1 tablet (81 mg total) by mouth daily. 90 tablet 3  . dexamethasone (DECADRON) 4 MG tablet Take 2 tablets (8 mg total) by mouth daily for 10 days. 20 tablet 0  . furosemide (LASIX) 20 MG tablet Take 1 tablet (20 mg total) by mouth daily. 30 tablet 1  . gabapentin (NEURONTIN) 300 MG capsule Take 1 capsule (300 mg total) by mouth 3 (three) times daily. 90 capsule 0  . Multiple Vitamin (MULTIVITAMIN WITH MINERALS) TABS tablet Take 1 tablet by mouth daily.     No current facility-administered medications for this visit.     PHYSICAL EXAMINATION: ECOG PERFORMANCE STATUS: 1 - Symptomatic but completely ambulatory  . Vitals:   02/21/17 1009  BP: 101/77  Pulse: 81  Resp: 18  Temp: 99 F (37.2 C)  SpO2: 100%    There were no vitals filed for this visit. .There is no height or weight on file to calculate BMI.  Jo Kitchen GENERAL:alert, in no acute distress and comfortable SKIN: no acute rashes, no significant lesions EYES: conjunctiva are pink and non-injected, sclera anicteric OROPHARYNX: MMM, no exudates, no oropharyngeal erythema or ulceration NECK: supple, no JVD LYMPH:  no palpable lymphadenopathy in the cervical, axillary or inguinal regions LUNGS: clear to auscultation b/l with normal respiratory effort HEART: regular rate & rhythm ABDOMEN:  normoactive bowel sounds , non tender, not distended. Extremity: no pedal edema PSYCH: alert & oriented x 3 with fluent speech NEURO: no focal motor/sensory deficits  LABORATORY DATA:   I have  reviewed the data as listed  . CBC Latest Ref Rng & Units 02/21/2017 02/14/2017 02/10/2017  WBC 3.9 - 10.3 K/uL 0.6(LL) 9.3 15.1(H)  Hemoglobin 12.0 - 15.0 g/dL - - 8.4(L)  Hematocrit 34.8 - 46.6 % 25.0(L) 28.1(L) 25.7(L)  Platelets 145 - 400 K/uL 25(L) 40(L) 44(L)    . CMP Latest Ref Rng & Units 02/21/2017 02/14/2017 02/07/2017  Glucose 70 - 140 mg/dL 80 88 106(H)  BUN 7 - 26 mg/dL 21 18 13   Creatinine 0.60 - 1.10 mg/dL 0.91 0.85 0.93  Sodium 136 - 145 mmol/L 141 140 135  Potassium 3.5 - 5.1 mmol/L 3.7 3.2(L) 3.5  Chloride 98 - 109 mmol/L 108 105 103  CO2 22 - 29 mmol/L 21(L) 21(L) 24  Calcium 8.4 - 10.4 mg/dL 8.9 8.9 8.6(L)  Total Protein 6.4 - 8.3 g/dL 5.9(L) 5.9(L) -  Total Bilirubin 0.2 - 1.2  mg/dL 0.4 0.6 -  Alkaline Phos 40 - 150 U/L 46 66 -  AST 5 - 34 U/L 30 35(H) -  ALT 0 - 55 U/L 31 17 -     02/09/17 Flow Cytometry  02/09/17     RADIOGRAPHIC STUDIES: I have personally reviewed the radiological images as listed and agreed with the findings in the report. Dg Tibia/fibula Left  Result Date: 02/06/2017 CLINICAL DATA:  Leg pain.  History of lymphoma EXAM: LEFT TIBIA AND FIBULA - 2 VIEW COMPARISON:  None. FINDINGS: There is no evidence of fracture or other focal bone lesions. Soft tissues are unremarkable. IMPRESSION: Negative. Electronically Signed   By: Franchot Gallo M.D.   On: 02/06/2017 08:48   Ct Chest W Contrast  Result Date: 02/06/2017 CLINICAL DATA:  H/o diffuse large B cell lymphoma dx'd 9/18, last chemo 12/18 Lt groin, leg, foot pain & numbness since 1/19 Recurrent rt pleural effusion EXAM: CT CHEST, ABDOMEN, AND PELVIS WITH CONTRAST TECHNIQUE: Multidetector CT imaging of the chest, abdomen and pelvis was performed following the standard protocol during bolus administration of intravenous contrast. CONTRAST:  100 mL of Isovue-300 intravenous contrast COMPARISON:  PET-CT, 01/05/2017 and 10/21/2016. FINDINGS: CT CHEST FINDINGS Cardiovascular: Heart is normal in size. No  pericardial effusion. No significant coronary artery calcifications. Aorta is normal in caliber. No aortic dissection or atherosclerosis. Mild prominence of the main pulmonary artery measuring 3.5 cm. Mediastinum/Nodes: Ill-defined low-attenuation soft tissue lies adjacent to the lower thoracic spine vertebra and contacts the descending thoracic aorta, without convincing change most recent PET-CT. No discrete mediastinal or hilar masses and no pathologically enlarged lymph nodes. Trachea is widely patent. Esophagus is unremarkable. No neck base or axillary masses or adenopathy. Lungs/Pleura: Small right pleural effusion. Opacity the anterolateral right upper lobe and in the right middle lobe, consistent with atelectasis, is stable from the prior PET-CT. Some pleural based opacity along the anterior right upper lobe more superiorly has mildly improved. Mild pleural base parenchymal opacity is noted in the right lower lobe, most likely atelectasis, mildly improved from the PET-CT. Left lung is clear. No left pleural effusion. No pneumothorax. Musculoskeletal: There is thickening of the right anterior inferior chest wall musculature extending superiorly from the right sixth rib costal cartilage to the right eighth rib costal cartilage. Adjacent to this, at the level of the eighth rib costal cartilage, there is a 14 mm irregular soft tissue nodule. These findings are new from the recent PET-CT. The muscular thickening measures 2.3 cm, anterior to posterior. No osteoblastic or osteolytic lesions. No fracture or acute finding. CT ABDOMEN PELVIS FINDINGS Hepatobiliary: Liver mildly enlarged, right lobe measuring 2.4 cm from superior inferior. No liver mass or focal lesion. There is irregular wall thickening of the gallbladder most evident along the fundus focally. No evidence of gallstones. No pericholecystic inflammation. No bile duct dilation. Pancreas: Unremarkable. No pancreatic ductal dilatation or surrounding  inflammatory changes. Spleen: Spleen borderline enlarged measuring 11.5 x 5.5 x 11.8 cm, without significant change when compared to the most recent prior PET-CT. No splenic mass or focal lesion. Adrenals/Urinary Tract: No adrenal masses. Subcentimeter lower pole cyst arises from the left kidney. No other renal masses, no stones and no hydronephrosis. Symmetric renal enhancement and excretion. Normal ureters. Bladder is unremarkable. Stomach/Bowel: No stomach, small bowel or colonic mass. No mesenteric masses. Bowel is normal in caliber. Stomach and small bowel are unremarkable. There are multiple left colon diverticula. No diverticulitis or other colonic inflammatory process. Normal appendix is visualized.  Vascular/Lymphatic: Enlarged obturator node on the left surrounds a left internal iliac artery branch. This measures 3.8 x 3.6 cm, increased from 2.7 x 2.0 cm on the prior PET-CT. There is an adjacent left external iliac chain lymph node measuring 17 mm in short axis, stable from the PET-CT. There are 2 adjacent right external iliac chain lymph nodes, largest measuring 2.8 x 1.8 cm, also without change. No other enlarged lymph nodes. Aorta is normal in caliber. No atherosclerosis. No significant vascular abnormality. Reproductive: Status post hysterectomy. No adnexal masses. Other: No abdominal wall hernia. There is a focus of increased attenuation in the subcutaneous fat of the right upper abdomen wall which may be from a soft tissue injection. This does not appear to be a discrete mass. Calcified soft tissue granuloma are noted in the left lateral upper chest wall subcutaneous fat. No ascites. Musculoskeletal: No osteoblastic or osteolytic lesions. IMPRESSION: 1. There is a new chest wall mass involving the musculature of the right, anterior inferior chest wall, spanning from approximately the right sixth through the right eighth rib cartilages. There is a new adjacent subcutaneous soft tissue mass measuring 14  mm. Findings are consistent with new areas of soft tissue lymphoma. 2. In the pelvis, there is enlargement of a left obturator lymph node when compared to the recent PET-CT, consistent with progression lymphoma. 3. Previously described soft tissue along the descending thoracic aorta is without significant change from the most recent prior CT. 4. Small right pleural effusion and areas right lung pleural based opacity are similar to the PET-CT, with subtle areas of improvement of some of the pleural-based right lung opacity. 5. There is mild irregular wall thickening noted along the gallbladder mostly at the fundus. This could be a benign etiology, specifically adenomyomatosis. Infiltration of the gallbladder wall with lymphoma is possible. Consider further assessment with limited right upper quadrant ultrasound. 6. Mild hepatomegaly and borderline enlargement of the spleen is stable from the recent PET-CT. Electronically Signed   By: Lajean Manes M.D.   On: 02/06/2017 14:28   Mr Brain Wo Contrast  Result Date: 02/06/2017 CLINICAL DATA:  Numbness or tingling, paresthesia. Numbness involves the chin and left lower extremity. History of non-Hodgkin's lymphoma. EXAM: MRI HEAD WITHOUT CONTRAST TECHNIQUE: Multiplanar, multiecho pulse sequences of the brain and surrounding structures were obtained without intravenous contrast. COMPARISON:  None. FINDINGS: Brain: Incomplete study with diffusion, sagittal T1, axial FLAIR, axial T2, and axial gradient acquired. No acute infarct, blood products, hydrocephalus, or masslike finding. There is FLAIR hyperintensity about the atria of the lateral ventricles where there is mild apparent ependymal bulging towards the ventricles. This is likely due to slice angle given the symmetric appearance and lack of restricted diffusion in this area. These signal changes are usually seen with chronic small vessel ischemia. No suspected mass. Partially empty sella Vascular: Major flow voids are  preserved. Skull and upper cervical spine: No evidence of marrow lesion. Sinuses/Orbits: Negative IMPRESSION: 1. Partial, noncontrast brain MRI due to patient discomfort. 2. No acute finding or explanation for symptoms. Electronically Signed   By: Monte Fantasia M.D.   On: 02/06/2017 13:18   Ct Abdomen Pelvis W Contrast  Result Date: 02/06/2017 CLINICAL DATA:  H/o diffuse large B cell lymphoma dx'd 9/18, last chemo 12/18 Lt groin, leg, foot pain & numbness since 1/19 Recurrent rt pleural effusion EXAM: CT CHEST, ABDOMEN, AND PELVIS WITH CONTRAST TECHNIQUE: Multidetector CT imaging of the chest, abdomen and pelvis was performed following the standard protocol during  bolus administration of intravenous contrast. CONTRAST:  100 mL of Isovue-300 intravenous contrast COMPARISON:  PET-CT, 01/05/2017 and 10/21/2016. FINDINGS: CT CHEST FINDINGS Cardiovascular: Heart is normal in size. No pericardial effusion. No significant coronary artery calcifications. Aorta is normal in caliber. No aortic dissection or atherosclerosis. Mild prominence of the main pulmonary artery measuring 3.5 cm. Mediastinum/Nodes: Ill-defined low-attenuation soft tissue lies adjacent to the lower thoracic spine vertebra and contacts the descending thoracic aorta, without convincing change most recent PET-CT. No discrete mediastinal or hilar masses and no pathologically enlarged lymph nodes. Trachea is widely patent. Esophagus is unremarkable. No neck base or axillary masses or adenopathy. Lungs/Pleura: Small right pleural effusion. Opacity the anterolateral right upper lobe and in the right middle lobe, consistent with atelectasis, is stable from the prior PET-CT. Some pleural based opacity along the anterior right upper lobe more superiorly has mildly improved. Mild pleural base parenchymal opacity is noted in the right lower lobe, most likely atelectasis, mildly improved from the PET-CT. Left lung is clear. No left pleural effusion. No  pneumothorax. Musculoskeletal: There is thickening of the right anterior inferior chest wall musculature extending superiorly from the right sixth rib costal cartilage to the right eighth rib costal cartilage. Adjacent to this, at the level of the eighth rib costal cartilage, there is a 14 mm irregular soft tissue nodule. These findings are new from the recent PET-CT. The muscular thickening measures 2.3 cm, anterior to posterior. No osteoblastic or osteolytic lesions. No fracture or acute finding. CT ABDOMEN PELVIS FINDINGS Hepatobiliary: Liver mildly enlarged, right lobe measuring 2.4 cm from superior inferior. No liver mass or focal lesion. There is irregular wall thickening of the gallbladder most evident along the fundus focally. No evidence of gallstones. No pericholecystic inflammation. No bile duct dilation. Pancreas: Unremarkable. No pancreatic ductal dilatation or surrounding inflammatory changes. Spleen: Spleen borderline enlarged measuring 11.5 x 5.5 x 11.8 cm, without significant change when compared to the most recent prior PET-CT. No splenic mass or focal lesion. Adrenals/Urinary Tract: No adrenal masses. Subcentimeter lower pole cyst arises from the left kidney. No other renal masses, no stones and no hydronephrosis. Symmetric renal enhancement and excretion. Normal ureters. Bladder is unremarkable. Stomach/Bowel: No stomach, small bowel or colonic mass. No mesenteric masses. Bowel is normal in caliber. Stomach and small bowel are unremarkable. There are multiple left colon diverticula. No diverticulitis or other colonic inflammatory process. Normal appendix is visualized. Vascular/Lymphatic: Enlarged obturator node on the left surrounds a left internal iliac artery branch. This measures 3.8 x 3.6 cm, increased from 2.7 x 2.0 cm on the prior PET-CT. There is an adjacent left external iliac chain lymph node measuring 17 mm in short axis, stable from the PET-CT. There are 2 adjacent right external  iliac chain lymph nodes, largest measuring 2.8 x 1.8 cm, also without change. No other enlarged lymph nodes. Aorta is normal in caliber. No atherosclerosis. No significant vascular abnormality. Reproductive: Status post hysterectomy. No adnexal masses. Other: No abdominal wall hernia. There is a focus of increased attenuation in the subcutaneous fat of the right upper abdomen wall which may be from a soft tissue injection. This does not appear to be a discrete mass. Calcified soft tissue granuloma are noted in the left lateral upper chest wall subcutaneous fat. No ascites. Musculoskeletal: No osteoblastic or osteolytic lesions. IMPRESSION: 1. There is a new chest wall mass involving the musculature of the right, anterior inferior chest wall, spanning from approximately the right sixth through the right eighth rib cartilages.  There is a new adjacent subcutaneous soft tissue mass measuring 14 mm. Findings are consistent with new areas of soft tissue lymphoma. 2. In the pelvis, there is enlargement of a left obturator lymph node when compared to the recent PET-CT, consistent with progression lymphoma. 3. Previously described soft tissue along the descending thoracic aorta is without significant change from the most recent prior CT. 4. Small right pleural effusion and areas right lung pleural based opacity are similar to the PET-CT, with subtle areas of improvement of some of the pleural-based right lung opacity. 5. There is mild irregular wall thickening noted along the gallbladder mostly at the fundus. This could be a benign etiology, specifically adenomyomatosis. Infiltration of the gallbladder wall with lymphoma is possible. Consider further assessment with limited right upper quadrant ultrasound. 6. Mild hepatomegaly and borderline enlargement of the spleen is stable from the recent PET-CT. Electronically Signed   By: Lajean Manes M.D.   On: 02/06/2017 14:28   Ct Biopsy  Result Date: 01/23/2017 INDICATION:  History of diffuse large B-cell lymphoma now with concern for recurrence. Please perform CT-guided biopsy of hypermetabolic right external iliac chain lymph node for tissue diagnostic purposes. EXAM: CT-GUIDED BIOPSY OF RIGHT EXTERNAL ILIAC CHAIN LYMPH NODE COMPARISON:  PET-CT - 01/05/2017 MEDICATIONS: None. ANESTHESIA/SEDATION: Fentanyl 100 mcg IV; Versed 2 mg IV Sedation time: 16 minutes; The patient was continuously monitored during the procedure by the interventional radiology nurse under my direct supervision. CONTRAST:  None. COMPLICATIONS: None immediate. PROCEDURE: Informed consent was obtained from the patient following an explanation of the procedure, risks, benefits and alternatives. A time out was performed prior to the initiation of the procedure. The patient was positioned supine on the CT table and a limited CT was performed for procedural planning demonstrating unchanged size and appearance of known hypermetabolic right external iliac chain nodal conglomeration with dominant component measuring approximately 3.9 x 1.9 cm (image 18, series 3. The procedure was planned. The operative site was prepped and draped in the usual sterile fashion. Appropriate trajectory was confirmed with a 22 gauge spinal needle after the adjacent tissues were anesthetized with 1% Lidocaine with epinephrine. Under intermittent CT guidance, a 17 gauge coaxial needle was advanced into the peripheral aspect of the nodal conglomeration. Appropriate positioning was confirmed and 5 core needle biopsy samples were obtained with an 18 gauge core needle biopsy device. The co-axial needle was removed and hemostasis was achieved with manual compression. A limited postprocedural CT was negative for hemorrhage or additional complication. A dressing was placed. The patient tolerated the procedure well without immediate postprocedural complication. IMPRESSION: Technically successful CT guided core needle biopsy of hypermetabolic right  external iliac chain nodal conglomeration. Electronically Signed   By: Sandi Mariscal M.D.   On: 01/23/2017 11:41   Vas [place] Lower Extremity Venous (dvt)  Result Date: 02/06/2017  Lower Venous Study Indication: Pain. Examination Guidelines: A complete evaluation includes B-mode imaging, spectral doppler, color doppler, and power doppler as needed of all accessible portions of each vessel. Bilateral testing is considered an integral part of a complete examination. Limited examinations for reoccurring indications may be performed as noted. The reflux portion of the exam is performed with the patient in reverse Trendelenburg.  Limitations: Body habitus.  Right Venous Findings: +---+---------------+---------+-----------+----------+-------+    CompressibilityPhasicitySpontaneityPropertiesSummary +---+---------------+---------+-----------+----------+-------+ CFVFull           Yes      Yes                          +---+---------------+---------+-----------+----------+-------+  Left Venous Findings: +---------+---------------+---------+-----------+----------+-------+          CompressibilityPhasicitySpontaneityPropertiesSummary +---------+---------------+---------+-----------+----------+-------+ CFV      Full           Yes      Yes                          +---------+---------------+---------+-----------+----------+-------+ FV Prox  Full                                                 +---------+---------------+---------+-----------+----------+-------+ FV Mid   Full                                                 +---------+---------------+---------+-----------+----------+-------+ FV DistalFull                                                 +---------+---------------+---------+-----------+----------+-------+ PFV      Full                                                 +---------+---------------+---------+-----------+----------+-------+ POP      Full           Yes       Yes                          +---------+---------------+---------+-----------+----------+-------+ PTV      Full                                                 +---------+---------------+---------+-----------+----------+-------+ PERO     Full                                                 +---------+---------------+---------+-----------+----------+-------+    Final Interpretation: Right: No evidence of common femoral vein obstruction. Left: There is no evidence of deep vein thrombosis in the lower extremity.There is no evidence of superficial venous thrombosis. No cystic structure found in the popliteal fossa.  *See table(s) above for measurements and observations. Electronically signed by Harold Barban on 02/06/2017 at 10:36:16 AM.   Final    ASSESSMENT & PLAN:   67 y.o. 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  -PET scan skull base to thigh on 10/21/2016 with results revealing: IMPRESSION:1. There has been significant interval improvement in the chest,  abdomen, and pelvis in the interval. The dominant mass in the chest is much smaller and less FDG avid in the interval. However, the remaining maximum uptake is slightly greater than mediastinal blood pool. The dominant remaining disease in the pelvis is an external iliac lymph node with a maximum SUV moderately greater than liver blood pool. Evaluation of the bones is more difficult today given the diffuse uptake likely associated with recent chemotherapy. I suspect the previously identified humeral lesion remains. Recommend continued attention to the bones on  follow-up. [place] Category 4  -PET scan skull base to thigh on 01/05/2017 r after 6 cycles of R-CHOP and IT MTX revealing: IMPRESSION:1. Mixed findings on the PET-CT. There are definite areas of significant continued improvement but also some new areas of involvement and slightly progressive pelvic adenopathy as detailed above. 2. Near complete resolution of the right pleural effusion. 3. Much improved osseous disease.  Bx of pelvic LN consistent with refractory DLBCL  BM BX - shows extensive BM  Involvement by large cell lymphoma (02/09/2017)  3) Symptomatic anemia from BM involvement by lymphoma and chemotherapy  4) Severe thrombocytopenia PLT 25k BM involvement by lymphoma and chemotherapy Plan: -MRI brain did not show any overt brain involvement of lymphoma. -Discussed with the pt that her BM revealed 90% present lymphoma, that she did not have a complete response to the first treatment and that the unresponsive parts of her lymphoma are severe, but still treatable with second line treatment -Discussed 2-3 cycles followed by repeat imaging. -here for C1D8 of GCD with neulasta support. No overt prohibitive toxicities though significant neutropenia and thrombocytopenia - therefore will hold D8 gemcitabine and proceed with Rituxan and neulasta shot. -Discussed again that we would like her to see Dr. Thera Flake at [facility] regarding a potential BM transplant or Car T cell therapies, as we begin second line. -Decadron refilled -Offered her a referral to a PT appointment which she declined because she believes that walking on a treadmill by herself will be best.  -Encouraged her to stay as active as she can. -Provided supplemental information on relapsed Large B Cell Lymphoma -2 units of PRBC transfusion -Holding off on Gemcitabine today due to low Platelets at 25k, WBC at 0.6k -Neulasta shot and Rituxan today; labs in one week. -transfuse platelet prn for PLT<10k or issues with bleeding   #5 lower  back pain radiating to left lower extremity MI L spine noted - Spondylosis worst at L4-5 where there is severe central canal stenosis and mild to moderate foraminal narrowing, worse on the right. Advanced facet degenerative disease at this level results in 0.3 cm anterolisthesis.  Right worse than left facet arthropathy at L5-S1 with an associated right facet joint effusion. Facet arthropathy and disc cause mild right foraminal narrowing at this level. The central canal and left foramen are open.  PLAN -Spine MRI shows lots of disk disease and nerve pinching but now lymphoma.  -The pt reports continuing, significant pain in her legs. We will adjust her current percocet from 1 pill every 6 hours to 1-2 pills every 4 hours. -monitor for excessive sedation given concern for sleep apnea -Gabapentin cancelled, replacing her on Cymbalta -recommened PT referra which patient currently declined.   Hold gemcitabine today and doing only Rituxan Neulasta today 2units of PRBcs today Rpt labs in 1 week RTC with Dr Irene Limbo in 2 weeks with labs Schedule C2D1 in 2weeks with labs F/u at [facility] as per referral with Dr Thera Flake   . The  total time spent in the appointment was 40 minutes and more than 50% was on counseling and direct patient cares.      Sullivan Lone MD [place] AAHIVMS [facility] Hematology/Oncology Physician [facility]  (Office):       [phone] (Work cell):  [phone] (Fax):           [phone]  This document serves as a record of services personally performed by Sullivan Lone, MD. It was created on his behalf by Baldwin Jamaica, a trained medical scribe. The creation of this record is based on the scribe's personal observations and the provider's statements to them.   .I have reviewed the above documentation for accuracy and completeness, and I agree with the above. Brunetta Genera MD MS

## 2017-02-21 ENCOUNTER — Encounter: Payer: Self-pay | Admitting: *Deleted

## 2017-02-21 ENCOUNTER — Inpatient Hospital Stay: Payer: Medicare Other

## 2017-02-21 ENCOUNTER — Telehealth: Payer: Self-pay

## 2017-02-21 ENCOUNTER — Inpatient Hospital Stay (HOSPITAL_BASED_OUTPATIENT_CLINIC_OR_DEPARTMENT_OTHER): Payer: Medicare Other | Admitting: Hematology

## 2017-02-21 ENCOUNTER — Encounter: Payer: Self-pay | Admitting: Hematology

## 2017-02-21 ENCOUNTER — Telehealth: Payer: Self-pay | Admitting: *Deleted

## 2017-02-21 VITALS — BP 101/77 | HR 81 | Temp 99.0°F | Resp 18

## 2017-02-21 VITALS — BP 133/77 | HR 77 | Temp 99.2°F | Resp 18

## 2017-02-21 DIAGNOSIS — C8338 Diffuse large B-cell lymphoma, lymph nodes of multiple sites: Secondary | ICD-10-CM

## 2017-02-21 DIAGNOSIS — M549 Dorsalgia, unspecified: Secondary | ICD-10-CM | POA: Diagnosis not present

## 2017-02-21 DIAGNOSIS — D72829 Elevated white blood cell count, unspecified: Secondary | ICD-10-CM | POA: Diagnosis not present

## 2017-02-21 DIAGNOSIS — Z79899 Other long term (current) drug therapy: Secondary | ICD-10-CM | POA: Diagnosis not present

## 2017-02-21 DIAGNOSIS — D649 Anemia, unspecified: Secondary | ICD-10-CM

## 2017-02-21 DIAGNOSIS — R200 Anesthesia of skin: Secondary | ICD-10-CM

## 2017-02-21 DIAGNOSIS — Z5111 Encounter for antineoplastic chemotherapy: Secondary | ICD-10-CM | POA: Diagnosis not present

## 2017-02-21 LAB — COMPREHENSIVE METABOLIC PANEL
ALK PHOS: 46 U/L (ref 40–150)
ALT: 31 U/L (ref 0–55)
AST: 30 U/L (ref 5–34)
Albumin: 3 g/dL — ABNORMAL LOW (ref 3.5–5.0)
Anion gap: 12 — ABNORMAL HIGH (ref 3–11)
BUN: 21 mg/dL (ref 7–26)
CHLORIDE: 108 mmol/L (ref 98–109)
CO2: 21 mmol/L — ABNORMAL LOW (ref 22–29)
CREATININE: 0.91 mg/dL (ref 0.60–1.10)
Calcium: 8.9 mg/dL (ref 8.4–10.4)
GFR calc Af Amer: >60 mL/min (ref >60)
Glucose, Bld: 80 mg/dL (ref 70–140)
Potassium: 3.7 mmol/L (ref 3.5–5.1)
Sodium: 141 mmol/L (ref 136–145)
Total Bilirubin: 0.4 mg/dL (ref 0.2–1.2)
Total Protein: 5.9 g/dL — ABNORMAL LOW (ref 6.4–8.3)

## 2017-02-21 LAB — CBC WITH DIFFERENTIAL (CANCER CENTER ONLY)
Basophils Absolute: 0 10*3/uL (ref 0.0–0.1)
Basophils Relative: 0 %
EOS PCT: 2 %
Eosinophils Absolute: 0 10*3/uL (ref 0.0–0.5)
HCT: 25 % — ABNORMAL LOW (ref 34.8–46.6)
Hemoglobin: 7.9 g/dL — ABNORMAL LOW (ref 11.6–15.9)
LYMPHS ABS: 0.3 10*3/uL — AB (ref 0.9–3.3)
LYMPHS PCT: 45 %
MCH: 26.4 pg (ref 25.1–34.0)
MCHC: 31.6 g/dL (ref 31.5–36.0)
MCV: 83.6 fL (ref 79.5–101.0)
MONO ABS: 0 10*3/uL — AB (ref 0.1–0.9)
Monocytes Relative: 3 %
Neutro Abs: 0.3 10*3/uL — CL (ref 1.5–6.5)
Neutrophils Relative %: 50 %
PLATELETS: 25 10*3/uL — AB (ref 145–400)
RBC: 2.99 MIL/uL — AB (ref 3.70–5.45)
RDW: 16.1 % — AB (ref 11.2–14.5)
WBC: 0.6 10*3/uL — AB (ref 3.9–10.3)

## 2017-02-21 LAB — RETICULOCYTES
RBC.: 2.91 MIL/uL — ABNORMAL LOW (ref 3.87–5.11)
Retic Ct Pct: <0.4 % — ABNORMAL LOW (ref 0.4–3.1)

## 2017-02-21 LAB — ABO/RH: ABO/RH(D): A POS

## 2017-02-21 LAB — PREPARE RBC (CROSSMATCH)

## 2017-02-21 LAB — LACTATE DEHYDROGENASE: LDH: 1107 U/L — AB (ref 125–245)

## 2017-02-21 MED ORDER — PROCHLORPERAZINE MALEATE 10 MG PO TABS
10.0000 mg | ORAL_TABLET | Freq: Once | ORAL | Status: AC
  Administered 2017-02-21: 10 mg via ORAL

## 2017-02-21 MED ORDER — HEPARIN SOD (PORK) LOCK FLUSH 100 UNIT/ML IV SOLN
500.0000 [IU] | Freq: Once | INTRAVENOUS | Status: AC | PRN
  Administered 2017-02-21: 500 [IU]
  Filled 2017-02-21: qty 5

## 2017-02-21 MED ORDER — DEXAMETHASONE 4 MG PO TABS: 8.0000 mg | ORAL_TABLET | Freq: Every day | ORAL | 0 refills | Status: AC

## 2017-02-21 MED ORDER — DIPHENHYDRAMINE HCL 25 MG PO CAPS
50.0000 mg | ORAL_CAPSULE | Freq: Once | ORAL | Status: AC
  Administered 2017-02-21: 50 mg via ORAL

## 2017-02-21 MED ORDER — SODIUM CHLORIDE 0.9 % IV SOLN: 250.0000 mL | Freq: Once | INTRAVENOUS | Status: DC

## 2017-02-21 MED ORDER — DIPHENHYDRAMINE HCL 25 MG PO CAPS
ORAL_CAPSULE | ORAL | Status: AC
  Filled 2017-02-21: qty 2

## 2017-02-21 MED ORDER — DIPHENHYDRAMINE HCL 25 MG PO CAPS: 25.0000 mg | ORAL_CAPSULE | Freq: Once | ORAL | Status: DC

## 2017-02-21 MED ORDER — PROCHLORPERAZINE MALEATE 10 MG PO TABS
ORAL_TABLET | ORAL | Status: AC
Start: 2017-02-21 — End: ?
  Filled 2017-02-21: qty 1

## 2017-02-21 MED ORDER — ACETAMINOPHEN 325 MG PO TABS
ORAL_TABLET | ORAL | Status: AC
  Filled 2017-02-21: qty 2

## 2017-02-21 MED ORDER — SODIUM CHLORIDE 0.9 % IV SOLN
375.0000 mg/m2 | Freq: Once | INTRAVENOUS | Status: AC
  Administered 2017-02-21: 800 mg via INTRAVENOUS
  Filled 2017-02-21: qty 30

## 2017-02-21 MED ORDER — ACETAMINOPHEN 325 MG PO TABS: 650.0000 mg | ORAL_TABLET | Freq: Once | ORAL | Status: DC

## 2017-02-21 MED ORDER — ACETAMINOPHEN 325 MG PO TABS
650.0000 mg | ORAL_TABLET | Freq: Once | ORAL | Status: AC
  Administered 2017-02-21: 650 mg via ORAL

## 2017-02-21 MED ORDER — PEGFILGRASTIM INJECTION 6 MG/0.6ML ~~LOC~~
6.0000 mg | PREFILLED_SYRINGE | Freq: Once | SUBCUTANEOUS | Status: AC
  Administered 2017-02-21: 6 mg via SUBCUTANEOUS

## 2017-02-21 MED ORDER — SODIUM CHLORIDE 0.9 % IV SOLN
Freq: Once | INTRAVENOUS | Status: AC
  Administered 2017-02-21: 12:00:00 via INTRAVENOUS

## 2017-02-21 MED ORDER — PEGFILGRASTIM INJECTION 6 MG/0.6ML ~~LOC~~
PREFILLED_SYRINGE | SUBCUTANEOUS | Status: AC
  Filled 2017-02-21: qty 0.6

## 2017-02-21 MED ORDER — SODIUM CHLORIDE 0.9% FLUSH
10.0000 mL | INTRAVENOUS | Status: DC | PRN
  Administered 2017-02-21: 10 mL
  Filled 2017-02-21: qty 10

## 2017-02-21 NOTE — Telephone Encounter (Signed)
SW Mckenzie at [place] clinic.  Per Dr. Thera Flake, pt not eligible for CAR-T therapy. [facility] could see pt for second opinion or for transplant.  Message relayed to Dr. Irene Limbo.  Dr. Eather Colas number provided.  Clinic call back number [phone].

## 2017-02-21 NOTE — Patient Instructions (Signed)
Thank you for choosing [facility] to provide your oncology and hematology care.  To afford each patient quality time with our providers, please arrive 30 minutes before your scheduled appointment time.  If you arrive late for your appointment, you may be asked to reschedule.  We strive to give you quality time with our providers, and arriving late affects you and other patients whose appointments are after yours.   If you are a no show for multiple scheduled visits, you may be dismissed from the clinic at the providers discretion.    Again, thank you for choosing [facility], our hope is that these requests will decrease the amount of time that you wait before being seen by our physicians.  ______________________________________________________________________  Should you have questions after your visit to the [facility], please contact our office at [phone] between the hours of 8:30 and 4:30 p.m.    Voicemails left after 4:30p.m will not be returned until the following business day.    For prescription refill requests, please have your pharmacy contact us directly.  Please also try to allow 48 hours for prescription requests.    Please contact the scheduling department for questions regarding scheduling.  For scheduling of procedures such as PET scans, CT scans, MRI, Ultrasound, etc please contact central scheduling at [phone].    Resources For Cancer Patients and Caregivers:   Oncolink.org:  A wonderful resource for patients and healthcare providers for information regarding your disease, ways to tract your treatment, what to expect, etc.     American Cancer Society:  800-227-2345  Can help patients locate various types of support and financial assistance  Cancer Care: 1-800-813-HOPE (4673) Provides financial assistance, online support groups, medication/co-pay assistance.    [place] DSS:  336-641-3447 Where to apply for food  stamps, Medicaid, and utility assistance  Medicare Rights Center: 800-333-4114 Helps people with Medicare understand their rights and benefits, navigate the Medicare system, and secure the quality healthcare they deserve  SCAT: 336-333-6589 [employer]'s shared-ride transportation service for eligible riders who have a disability that prevents them from riding the fixed route bus.    For additional information on assistance programs please contact our social worker:   Grier Hock/Abigail Elmore:  [phone]            

## 2017-02-21 NOTE — Telephone Encounter (Signed)
Asked inf. To print avs and calender for [place] appointment. Per 2/20 los. (Also injection For tomorrow is scheduled and patient is aware of time) per Amy RN in infusion.

## 2017-02-21 NOTE — Patient Instructions (Signed)
[facility] Discharge Instructions for Patients Receiving Chemotherapy  Today you received the following chemotherapy agents Rituxan   To help prevent nausea and vomiting after your treatment, we encourage you to take your nausea medication as directed  If you develop nausea and vomiting that is not controlled by your nausea medication, call the clinic.   BELOW ARE SYMPTOMS THAT SHOULD BE REPORTED IMMEDIATELY:  *FEVER GREATER THAN 100.5 F  *CHILLS WITH OR WITHOUT FEVER  NAUSEA AND VOMITING THAT IS NOT CONTROLLED WITH YOUR NAUSEA MEDICATION  *UNUSUAL SHORTNESS OF BREATH  *UNUSUAL BRUISING OR BLEEDING  TENDERNESS IN MOUTH AND THROAT WITH OR WITHOUT PRESENCE OF ULCERS  *URINARY PROBLEMS  *BOWEL PROBLEMS  UNUSUAL RASH Items with * indicate a potential emergency and should be followed up as soon as possible.  Feel free to call the clinic should you have any questions or concerns. The clinic phone number is [phone].  Please show the [place] at check-in to the Emergency Department and triage nurse.   Blood Transfusion, Care After This sheet gives you information about how to care for yourself after your procedure. Your doctor may also give you more specific instructions. If you have problems or questions, contact your doctor. Follow these instructions at home:  Take over-the-counter and prescription medicines only as told by your doctor.  Go back to your normal activities as told by your doctor.  Follow instructions from your doctor about how to take care of the area where an IV tube was put into your vein (insertion site). Make sure you: ? Wash your hands with soap and water before you change your bandage (dressing). If there is no soap and water, use hand sanitizer. ? Change your bandage as told by your doctor.  Check your IV insertion site every day for signs of infection. Check for: ? More redness, swelling, or pain. ? More fluid or  blood. ? Warmth. ? Pus or a bad smell. Contact a doctor if:  You have more redness, swelling, or pain around the IV insertion site..  You have more fluid or blood coming from the IV insertion site.  Your IV insertion site feels warm to the touch.  You have pus or a bad smell coming from the IV insertion site.  Your pee (urine) turns pink, red, or brown.  You feel weak after doing your normal activities. Get help right away if:  You have signs of a serious allergic or body defense (immune) system reaction, including: ? Itchiness. ? Hives. ? Trouble breathing. ? Anxiety. ? Pain in your chest or lower back. ? Fever, flushing, and chills. ? Fast pulse. ? Rash. ? Watery poop (diarrhea). ? Throwing up (vomiting). ? Dark pee. ? Serious headache. ? Dizziness. ? Stiff neck. ? Yellow color in your face or the white parts of your eyes (jaundice). Summary  After a blood transfusion, return to your normal activities as told by your doctor.  Every day, check for signs of infection where the IV tube was put into your vein.  Some signs of infection are warm skin, more redness and pain, more fluid or blood, and pus or a bad smell where the needle went in.  Contact your doctor if you feel weak or have any unusual symptoms. This information is not intended to replace advice given to you by your health care provider. Make sure you discuss any questions you have with your health care provider. Document Released: 01/09/2014 Document Revised: 08/13/2015 Document Reviewed:  08/13/2015 Elsevier Interactive Patient Education  2017 [employer].

## 2017-02-21 NOTE — Progress Notes (Signed)
Per Dr. Irene Limbo, patient has not been contacted by [facility] to set up appointment for CAR-T referral.  Referral originally made 02/09/17.  Information refaxed, LVM on referral line.  Fax confirmation received [phone]

## 2017-02-21 NOTE — Patient Instructions (Signed)
Implanted Port Home Guide An implanted port is a type of central line that is placed under the skin. Central lines are used to provide IV access when treatment or nutrition needs to be given through a person's veins. Implanted ports are used for long-term IV access. An implanted port may be placed because:  You need IV medicine that would be irritating to the small veins in your hands or arms.  You need long-term IV medicines, such as antibiotics.  You need IV nutrition for a long period.  You need frequent blood draws for lab tests.  You need dialysis.  Implanted ports are usually placed in the chest area, but they can also be placed in the upper arm, the abdomen, or the leg. An implanted port has two main parts:  Reservoir. The reservoir is round and will appear as a small, raised area under your skin. The reservoir is the part where a needle is inserted to give medicines or draw blood.  Catheter. The catheter is a thin, flexible tube that extends from the reservoir. The catheter is placed into a large vein. Medicine that is inserted into the reservoir goes into the catheter and then into the vein.  How will I care for my incision site? Do not get the incision site wet. Bathe or shower as directed by your health care provider. How is my port accessed? Special steps must be taken to access the port:  Before the port is accessed, a numbing cream can be placed on the skin. This helps numb the skin over the port site.  Your health care provider uses a sterile technique to access the port. ? Your health care provider must put on a mask and sterile gloves. ? The skin over your port is cleaned carefully with an antiseptic and allowed to dry. ? The port is gently pinched between sterile gloves, and a needle is inserted into the port.  Only "non-coring" port needles should be used to access the port. Once the port is accessed, a blood return should be checked. This helps ensure that the port  is in the vein and is not clogged.  If your port needs to remain accessed for a constant infusion, a clear (transparent) bandage will be placed over the needle site. The bandage and needle will need to be changed every week, or as directed by your health care provider.  Keep the bandage covering the needle clean and dry. Do not get it wet. Follow your health care provider's instructions on how to take a shower or bath while the port is accessed.  If your port does not need to stay accessed, no bandage is needed over the port.  What is flushing? Flushing helps keep the port from getting clogged. Follow your health care provider's instructions on how and when to flush the port. Ports are usually flushed with saline solution or a medicine called heparin. The need for flushing will depend on how the port is used.  If the port is used for intermittent medicines or blood draws, the port will need to be flushed: ? After medicines have been given. ? After blood has been drawn. ? As part of routine maintenance.  If a constant infusion is running, the port may not need to be flushed.  How long will my port stay implanted? The port can stay in for as long as your health care provider thinks it is needed. When it is time for the port to come out, surgery will be   done to remove it. The procedure is similar to the one performed when the port was put in. When should I seek immediate medical care? When you have an implanted port, you should seek immediate medical care if:  You notice a bad smell coming from the incision site.  You have swelling, redness, or drainage at the incision site.  You have more swelling or pain at the port site or the surrounding area.  You have a fever that is not controlled with medicine.  This information is not intended to replace advice given to you by your health care provider. Make sure you discuss any questions you have with your health care provider. Document  Released: 12/19/2004 Document Revised: 05/27/2015 Document Reviewed: 08/26/2012 Elsevier Interactive Patient Education  2017 Elsevier Inc.  

## 2017-02-22 ENCOUNTER — Ambulatory Visit: Payer: Medicare Other

## 2017-02-22 LAB — TYPE AND SCREEN
ABO/RH(D): A POS
ANTIBODY SCREEN: NEGATIVE
UNIT DIVISION: 0
Unit division: 0

## 2017-02-22 LAB — BPAM RBC
BLOOD PRODUCT EXPIRATION DATE: 201903072359
Blood Product Expiration Date: 201903072359
ISSUE DATE / TIME: 201902201309
ISSUE DATE / TIME: 201902201311
UNIT TYPE AND RH: 6200
Unit Type and Rh: 6200

## 2017-02-22 MED ORDER — PEGFILGRASTIM INJECTION 6 MG/0.6ML ~~LOC~~
PREFILLED_SYRINGE | SUBCUTANEOUS | Status: AC
  Filled 2017-02-22: qty 0.6

## 2017-02-22 MED FILL — Hydrocortisone Sodium Succinate For Inj 100 MG: INTRAMUSCULAR | Qty: 2 | Status: AC

## 2017-02-22 MED FILL — Methotrexate Sodium Inj PF 50 MG/2ML (25 MG/ML): INTRAMUSCULAR | Qty: 2 | Status: AC

## 2017-02-26 ENCOUNTER — Telehealth: Payer: Self-pay

## 2017-02-26 ENCOUNTER — Ambulatory Visit: Payer: Medicare Other | Admitting: Hematology

## 2017-02-26 ENCOUNTER — Encounter (HOSPITAL_COMMUNITY): Payer: Self-pay

## 2017-02-26 ENCOUNTER — Inpatient Hospital Stay: Payer: Medicare Other

## 2017-02-26 DIAGNOSIS — D649 Anemia, unspecified: Secondary | ICD-10-CM

## 2017-02-26 DIAGNOSIS — Z5111 Encounter for antineoplastic chemotherapy: Secondary | ICD-10-CM | POA: Diagnosis not present

## 2017-02-26 LAB — CMP (CANCER CENTER ONLY)
ALBUMIN: 3.2 g/dL — AB (ref 3.5–5.0)
ALT: 24 U/L (ref 0–55)
AST: 20 U/L (ref 5–34)
Alkaline Phosphatase: 72 U/L (ref 40–150)
Anion gap: 10 (ref 3–11)
BUN: 21 mg/dL (ref 7–26)
CO2: 23 mmol/L (ref 22–29)
CREATININE: 1.09 mg/dL (ref 0.60–1.10)
Calcium: 9.5 mg/dL (ref 8.4–10.4)
Chloride: 107 mmol/L (ref 98–109)
GFR, EST NON AFRICAN AMERICAN: 52 mL/min — AB (ref >60)
GFR, Est AFR Am: 60 mL/min — ABNORMAL LOW (ref >60)
GLUCOSE: 101 mg/dL (ref 70–140)
POTASSIUM: 3.8 mmol/L (ref 3.5–5.1)
SODIUM: 140 mmol/L (ref 136–145)
Total Bilirubin: 0.5 mg/dL (ref 0.2–1.2)
Total Protein: 6.1 g/dL — ABNORMAL LOW (ref 6.4–8.3)

## 2017-02-26 LAB — CBC WITH DIFFERENTIAL (CANCER CENTER ONLY)
Basophils Absolute: 0 10*3/uL (ref 0.0–0.1)
Basophils Relative: 0 %
EOS ABS: 0 10*3/uL (ref 0.0–0.5)
Eosinophils Relative: 0 %
HCT: 32 % — ABNORMAL LOW (ref 34.8–46.6)
Hemoglobin: 10.4 g/dL — ABNORMAL LOW (ref 11.6–15.9)
LYMPHS ABS: 0.5 10*3/uL — AB (ref 0.9–3.3)
Lymphocytes Relative: 5 %
MCH: 27 pg (ref 25.1–34.0)
MCHC: 32.5 g/dL (ref 31.5–36.0)
MCV: 83.1 fL (ref 79.5–101.0)
MONO ABS: 0.8 10*3/uL (ref 0.1–0.9)
MONOS PCT: 8 %
Neutro Abs: 9 10*3/uL — ABNORMAL HIGH (ref 1.5–6.5)
Neutrophils Relative %: 87 %
PLATELETS: 16 10*3/uL — AB (ref 145–400)
RBC: 3.86 MIL/uL (ref 3.70–5.45)
RDW: 16.6 % — ABNORMAL HIGH (ref 11.2–14.5)
WBC: 10.3 10*3/uL (ref 3.9–10.3)

## 2017-02-26 LAB — RETICULOCYTES
RBC.: 3.83 MIL/uL (ref 3.70–5.45)
Retic Count, Absolute: 19.2 10*3/uL — ABNORMAL LOW (ref 33.7–90.7)
Retic Ct Pct: 0.5 % — ABNORMAL LOW (ref 0.7–2.1)

## 2017-02-26 LAB — SAMPLE TO BLOOD BANK

## 2017-02-26 NOTE — Telephone Encounter (Signed)
Spoke with pt in relation to low plt count today of 16. Pt denied any increase in bleeding or bruising at this time. Plan per Dr. Irene Limbo to monitor as we will see pt with lab work next week. Noted pt chemotherapy scheduled on the weekend. Appointments changed with the help of Melissa X in scheduling.

## 2017-02-26 NOTE — Telephone Encounter (Signed)
Left VM with McKenzie, CSW in response to earlier VM requesting the nature of f/u with pt at [facility]. Pt is seeing Dr. Thera Flake as a second opinion for recommendation of other treatments, including BMT.

## 2017-02-26 NOTE — Telephone Encounter (Addendum)
Spoke with [place], SW regarding pt referral for BMT as she was not a candidate for CAR-T therapy per Dr. Thera Flake. Requested documentation sent: recent lab work, BMX results, imaging, and last OV note. Confirmed fax receipt to [phone] on 02/26/17 at [place].

## 2017-02-27 ENCOUNTER — Telehealth: Payer: Self-pay

## 2017-02-27 LAB — CHROMOSOME ANALYSIS, BONE MARROW

## 2017-02-27 LAB — TISSUE HYBRIDIZATION (BONE MARROW)-NCBH

## 2017-02-27 NOTE — Telephone Encounter (Signed)
Pt appointments changed to 03/09/17 with the assistance of Nicholaus Corolla, [place]. Called pt to confirm appt changes. Pt aware of lab, doctor, and infusion on 03/09/17 starting at 1130.

## 2017-02-28 ENCOUNTER — Encounter: Payer: Self-pay | Admitting: *Deleted

## 2017-03-06 ENCOUNTER — Encounter: Payer: Self-pay | Admitting: Internal Medicine

## 2017-03-06 ENCOUNTER — Ambulatory Visit (INDEPENDENT_AMBULATORY_CARE_PROVIDER_SITE_OTHER): Payer: Medicare Other | Admitting: Internal Medicine

## 2017-03-06 VITALS — BP 118/64 | HR 91 | Ht 61.0 in | Wt 252.6 lb

## 2017-03-06 DIAGNOSIS — J90 Pleural effusion, not elsewhere classified: Secondary | ICD-10-CM

## 2017-03-06 NOTE — Patient Instructions (Signed)
ICD-10-CM   1. Recurrent right pleural effusion J90     Under remission with some minimal loculation/entrapment  Plan conitnue observation care PleurX or Pleurodesiss only if recurrent and symptomatic and cancer Rx unable to control  Followup 6 months or sooner if needed

## 2017-03-06 NOTE — Progress Notes (Signed)
Subjective:     Patient ID: Jo Collins, female   DOB: Aug 25, 1950, 67 y.o.   MRN: [id number]  HPI   PCP Jo Pretty, MD  HPI  IOV 11/17/2016  Chief Complaint  Patient presents with  . Advice Only    Referred by Jo Collins. Pt was dx with cancer in July 2018, states that she has had fluid building up around lungs and fluid was pulled from around lungs multiple times with the last time being done was 2 weeks ago.  PET scan done 10/22/16. Denies any complaint of cough, SOB, or CP.    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  History is also reviewed from the outside chart of primary care physician Jo Collins and review of the oncologist notes.  Patient tells me that in July 2018 she started having shortness of breath and pleural effusion and was ultimately diagnosed by July/August 2018 with B-cell lymphoma.  She is undergoing chemotherapy with Dr. Irene Collins.  She tells me that the most recent CT scan of the chest shows improvement in  her cancer.  She has upcoming follow-up PET scan later in 2018 in December.  However her course has been characterized by dyspnea associated with large right pleural effusion.  She says she is at least had 5 thoracentesis.  I reviewed the cytology results and they have either mesothelial cells are lymphocyte predominance.  Presumably this is because of malignancy.  Thoracentesis large volume thoracentesis.  It is associated with relief in dyspnea.  She says she was requiring thoracentesis at least every few weeks.  Most recently her thoracentesis was end of October 2018.  She feels that this time the amount of fluid was less and she does not feel a recurrence.  It correlates with improvement in her cancer status with the chemotherapy and improvement of that.  Therefore she does not feel the need for a Pleurx catheter or pleurodesis.  She is happy with an expectant approach with multiple repeated thoracentesis.  Her expectation is that as the cancer continues to respond  to chemotherapy the effusions will improve.  I personally visualized all the images done in 2018 including CT scan of the chest and chest x-ray and I confirmed with the findings.  I also visualized the pathology report.   has a past medical history of GERD (gastroesophageal reflux disease) and NHL (non-Hodgkin's lymphoma) ([place]).   reports that she quit smoking about 3 months ago. Her smoking use included cigarettes. She has a 23.50 pack-year smoking history. she has never used smokeless toba     OV 03/06/2017  Chief Complaint  Patient presents with  . Follow-up    PET scan 01/05/17, CT biopsies done 1/22,2/8, and 2/14, and a CT w/ contrast 02/06/17.  Pt states she has been doing good since last visit except she has been having problems with A-fib. Pt goes to Jo Collins tomorrow per her oncologist.     Jo Collins 67 y.o. female [place] [address] presents for follow-up of her recurrent right pleural effusion.  She is on observation therapy after the pleural effusion improved.  Last saw her in November 2018.  Since that time she says she is feeling fine.  She uses a walker.  ECOG is 0.  She says that her oncologist Dr. Velvet Collins feels that the lymphoma is back and she has been referred to Jo Collins tomorrow.  Meanwhile she did have a CT scan of the chest February 2019 that I personally visualized.  This shows that the pleural effusion is very small.  It might even be loculated with some trapped lung.  Nevertheless she is not symptomatic with shortness of breath.  It appears the infusion has improved with treatment of lymphoma.  She wants to continue to follow-up with pulmonary clinic for supportive care and expectant approach.    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   reports that she quit smoking about 7 months ago. Her smoking use included cigarettes. She has a 23.50 pack-year smoking history. she has never used smokeless tobacco.  Past Surgical History:  Procedure Laterality Date  . ABDOMINAL HYSTERECTOMY    . IR CV LINE INJECTION  10/26/2016  . IR FLUORO GUIDE PORT INSERTION RIGHT  08/18/2016  . IR FLUORO GUIDE PORT INSERTION RIGHT  11/03/2016  . IR REMOVAL TUN CV CATH W/O FL  11/03/2016  . IR THORACENTESIS ASP PLEURAL SPACE W/IMG GUIDE  08/07/2016  . IR THORACENTESIS ASP PLEURAL SPACE W/IMG GUIDE  08/14/2016  . IR US GUIDE VASC ACCESS RIGHT  08/18/2016  . IR US GUIDE VASC ACCESS RIGHT  11/03/2016    No Known Allergies  Immunization History  Administered Date(s) Administered  . Influenza, High Dose Seasonal PF 11/10/2016  . Pneumococcal Polysaccharide-23 09/25/2016    Family History  Problem Relation Age of Onset  . COPD Mother   . Diabetes Mellitus II Sister   . Diabetes Mellitus II Brother   . Diabetes Mellitus II Sister   . Stroke Sister   . Hypertension Sister   . Diabetes Mellitus II Sister      Current Outpatient Medications:  .  acetaminophen (TYLENOL) 500 MG tablet, Take 1,000 mg by mouth every 6 (six) hours as needed for moderate pain., Disp: , Rfl:  .  aspirin EC 81 MG tablet, Take 1 tablet (81 mg total) by mouth daily., Disp: 90 tablet, Rfl: 3 .  furosemide (LASIX) 20 MG tablet, Take 1 tablet (20 mg total) by mouth daily., Disp: 30  tablet, Rfl: 1 .  gabapentin (NEURONTIN) 300 MG capsule, Take 1 capsule (300 mg total) by mouth 3 (three) times daily., Disp: 90 capsule, Rfl: 0 .  Multiple Vitamin (MULTIVITAMIN WITH MINERALS) TABS tablet, Take 1 tablet by mouth daily., Disp: , Rfl:  .  allopurinol (ZYLOPRIM) 100 MG tablet, Take 1 tablet (100 mg total) by mouth daily. (Patient not taking: Reported on 03/06/2017), Disp: 14 tablet, Rfl: 0 .  dexamethasone (DECADRON) 4 MG tablet, Take 4 mg by mouth daily. Take after chemo treatments, Disp: , Rfl:  .  omeprazole (PRILOSEC) 20 MG capsule, Take 20 mg by mouth daily. Take after chemo treatments, Disp: , Rfl:    Review of Systems     Objective:   Physical Exam Vitals:   03/06/17 1124  BP: 118/64  Pulse: 91  SpO2: 97%  Weight: 252 lb 9.6 oz (114.6 kg)  Height: 5\' 1"  (1.549 m)     General Appearance:    Looks well. Has walker  Head:    Normocephalic, without obvious abnormality, atraumatic  Eyes:    PERRL - yes, conjunctiva/corneas - clear  Ears:    Normal external ear canals, both ears  Nose:   NG tube - no  Throat:  ETT TUBE - no , OG tube - no  Neck:   Supple,  No enlargement/tenderness/nodules     Lungs:     Clear to auscultation bilaterally, distant sounds due to obesity  Chest wall:    No deformity  Heart:    S1 and S2 normal, no murmur, CVP - no.  Pressors - no  Abdomen:     Soft, no masses, no organomegaly  Genitalia:    Not done  Rectal:   not done  Extremities:   Extremities- intact     Skin:   Intact in exposed areas .     Neurologic:   Sedation - none -> RASS - na . Moves all 4s - yes. CAM-ICU - neg . Orientation - x3 +. Has walker        Assessment:       ICD-10-CM   1. Recurrent right pleural effusion J90        Plan:      Under remission with some minimal loculation/entrapment  Plan conitnue observation care PleurX or Pleurodesiss only if recurrent and symptomatic and cancer Rx unable to control  Followup 6 months or sooner if  needed   Dr. Brand Males, M.D., [facility]C.P Pulmonary and Critical Care Medicine Staff Physician, [place] Director - Interstitial Lung Disease  Program  Pulmonary [place] at [place], [place], [address]  Pager: [phone], If no answer or between  15:00h - 7:00h: call [phone] Telephone: [phone]

## 2017-03-07 ENCOUNTER — Telehealth: Payer: Self-pay

## 2017-03-07 ENCOUNTER — Other Ambulatory Visit: Payer: Medicare Other

## 2017-03-07 ENCOUNTER — Ambulatory Visit: Payer: Medicare Other | Admitting: Hematology

## 2017-03-07 NOTE — Telephone Encounter (Signed)
Spoke with patient to verify her concerning upcoming appointment on 3/8 at 11:30. Per 3/6 phone message.

## 2017-03-08 NOTE — Progress Notes (Signed)
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  The patient's last visit with [place] was on 02/21/17. She is accompanied today by her sister. The pt reports that she is doing well overall and saw Dr. Thera Flake at [facility] yesterday, 03/08/17, whom we also spoke with yesterday. She notes that she will return to [facility] on 4/29 and also has an appointment the transplant team there.  She notes that despite her current relapsed Lymphoma, her biggest problem right now is her sciatica pain.   Of note since the patient's last visit, pt has had a blood transfusion completed on 02/27/17.  She notes that her back is still painful and has not gotten better. She notes that she does will be seeing an orthopaedist soon. She takes Tramadol once per day. She notes that she is not taking anything for her leg and ankle swelling. She notes that she will be going to PT soon as well.   Lab results today (03/09/17) of CBC, CMP, and Reticulocytes is as follows: all values are WNL except for WBC at 21.9k, RBC at 3.36, Hgb at 9.1, HCT at 28.9, RDW at 17.8, Platelets at 112k, Neutro Abs at 17k, Monocytes abs at 2.8k, Basophils Abs at 300, CO2 at 20, Creatinine at 1.20, Total Protein at 5.6, Albumin at 2.7, AST at 36.   On review of systems, pt reports leg swelling, persisting back pain, leg swelling, persisting chin numbness, and denies chest wall pain, pain along the spine,  skin rashes, and any other symptoms.   REVIEW OF SYSTEMS:    .10 Point review of Systems was done is negative except as noted above. . Past Medical History:  Diagnosis Date  . Abdominal pain, RUQ   . Abnormal EKG 08/07/2016  . Acute midline thoracic back pain   . Acute respiratory distress 09/25/2016  . Anemia 08/07/2016  . Chronic low back pain 09/25/2016  . Cough   . DLBCL (diffuse large B cell lymphoma) ([place]) 08/17/2016  . GERD (gastroesophageal reflux disease)   . History of chemotherapy   . Hypokalemia 09/25/2016  . 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 Lymphadenopathy   . Morbid obesity with BMI of 50.0-59.9, adult ([place])   . Neutrophilic leukocytosis 44/08/1854  . NHL (non-Hodgkin's lymphoma) ([place])   . Pleural effusion on right 08/07/2016  . Recurrent pleural effusion on right 10/08/2016  . Recurrent right pleural effusion 09/25/2016  . S/P thoracentesis   . Shortness of breath 08/07/2016  . SOB (shortness of breath) 09/26/2016    Past Surgical History:  Procedure Laterality Date  . ABDOMINAL HYSTERECTOMY    . IR CV LINE INJECTION  10/26/2016  . IR FLUORO GUIDE PORT INSERTION RIGHT  08/18/2016  . IR FLUORO GUIDE PORT INSERTION RIGHT  11/03/2016  . IR REMOVAL TUN CV CATH W/O FL  11/03/2016  . IR THORACENTESIS ASP PLEURAL SPACE W/IMG GUIDE  08/07/2016  . IR THORACENTESIS ASP PLEURAL SPACE W/IMG GUIDE  08/14/2016  .  IR US GUIDE VASC ACCESS RIGHT  08/18/2016  . IR US GUIDE VASC ACCESS RIGHT  11/03/2016    . Social History   Tobacco Use  . Smoking status: Former Smoker    Packs/day: 0.50    Years: 47.00    Pack years: 23.50    Types: Cigarettes    Last attempt to quit: 07/28/2016    Years since quitting: 0.6  . Smokeless tobacco: Never Used  Substance Use Topics  . Alcohol use: No  . Drug use: No    ALLERGIES:  has No Known Allergies.  MEDICATIONS:  Current Outpatient Medications  Medication Sig Dispense Refill  . acetaminophen (TYLENOL) 500 MG tablet Take 1,000 mg by mouth every  6 (six) hours as needed for moderate pain.    Jo Collins allopurinol (ZYLOPRIM) 100 MG tablet Take 1 tablet (100 mg total) by mouth daily. 14 tablet 0  . aspirin EC 81 MG tablet Take 1 tablet (81 mg total) by mouth daily. 90 tablet 3  . dexamethasone (DECADRON) 4 MG tablet Take 4 mg by mouth daily. Take after chemo treatments    . furosemide (LASIX) 20 MG tablet Take 1 tablet (20 mg total) by mouth daily. 30 tablet 1  . gabapentin (NEURONTIN) 300 MG capsule Take 1 capsule (300 mg total) by mouth 3 (three) times daily. 90 capsule 0  . Multiple Vitamin (MULTIVITAMIN WITH MINERALS) TABS tablet Take 1 tablet by mouth daily.    Jo Collins omeprazole (PRILOSEC) 20 MG capsule Take 20 mg by mouth daily. Take after chemo treatments     No current facility-administered medications for this visit.     PHYSICAL EXAMINATION: ECOG PERFORMANCE STATUS: 1 - Symptomatic but completely ambulatory  . Vitals:   03/09/17 1208  BP: 109/81  Pulse: 77  Resp: 20  Temp: 98.1 F (36.7 C)  SpO2: 100%    Filed Weights   03/09/17 1208  Weight: 254 lb 4.8 oz (115.3 kg)   .Body mass index is 48.05 kg/m.  Jo Collins GENERAL:alert, in no acute distress and comfortable SKIN: no acute rashes, no significant lesions EYES: conjunctiva are pink and non-injected, sclera anicteric OROPHARYNX: MMM, no exudates, no oropharyngeal erythema or ulceration NECK: supple, no JVD LYMPH:  no palpable lymphadenopathy in the cervical, axillary or inguinal regions LUNGS: clear to auscultation b/l with normal respiratory effort HEART: regular rate & rhythm ABDOMEN:  normoactive bowel sounds , non tender, not distended. Extremity: no pedal edema PSYCH: alert & oriented x 3 with fluent speech NEURO: no focal motor/sensory deficits   LABORATORY DATA:   I have reviewed the data as listed  . CBC Latest Ref Rng & Units 03/09/2017 02/26/2017 02/21/2017  WBC 3.9 - 10.3 K/uL 21.9(H) 10.3 0.6(LL)  Hemoglobin 12.0 - 15.0 g/dL - - -  Hematocrit 34.8 - 46.6 %  28.9(L) 32.0(L) 25.0(L)  Platelets 145 - 400 K/uL 112(L) 16(L) 25(L)  HGB 9.1  . CMP Latest Ref Rng & Units 03/09/2017 02/26/2017 02/21/2017  Glucose 70 - 140 mg/dL 85 101 80  BUN 7 - 26 mg/dL 13 21 21   Creatinine 0.60 - 1.10 mg/dL 1.20(H) 1.09 0.91  Sodium 136 - 145 mmol/L 141 140 141  Potassium 3.5 - 5.1 mmol/L 3.8 3.8 3.7  Chloride 98 - 109 mmol/L 106 107 108  CO2 22 - 29 mmol/L 20(L) 23 21(L)  Calcium 8.4 - 10.4 mg/dL 9.0 9.5 8.9  Total Protein 6.4 - 8.3 g/dL 5.6(L) 6.1(L) 5.9(L)  Total Bilirubin 0.2 - 1.2 mg/dL 0.7 0.5 0.4  Alkaline Phos 40 - 150 U/L 120 72 46  AST 5 - 34 U/L 36(H) 20 30  ALT 0 - 55 U/L 20 24 31      02/09/17 Flow Cytometry  02/09/17     RADIOGRAPHIC STUDIES: I have personally reviewed the radiological images as listed and agreed with the findings in the report. Dg Tibia/fibula Left  Result Date: 02/06/2017 CLINICAL DATA:  Leg pain.  History of lymphoma EXAM: LEFT TIBIA AND FIBULA - 2 VIEW COMPARISON:  None. FINDINGS: There is no evidence of fracture or other focal bone lesions. Soft tissues are unremarkable. IMPRESSION: Negative. Electronically Signed   By: Franchot Gallo M.D.   On: 02/06/2017 08:48   Ct Chest W Contrast  Result Date: 02/06/2017 CLINICAL DATA:  H/o diffuse large B cell lymphoma dx'd 9/18, last chemo 12/18 Lt groin, leg, foot pain & numbness since 1/19 Recurrent rt pleural effusion EXAM: CT CHEST, ABDOMEN, AND PELVIS WITH CONTRAST TECHNIQUE: Multidetector CT imaging of the chest, abdomen and pelvis was performed following the standard protocol during bolus administration of intravenous contrast. CONTRAST:  100 mL of Isovue-300 intravenous contrast COMPARISON:  PET-CT, 01/05/2017 and 10/21/2016. FINDINGS: CT CHEST FINDINGS Cardiovascular: Heart is normal in size. No pericardial effusion. No significant coronary artery calcifications. Aorta is normal in caliber. No aortic dissection or atherosclerosis. Mild prominence of the main pulmonary artery  measuring 3.5 cm. Mediastinum/Nodes: Ill-defined low-attenuation soft tissue lies adjacent to the lower thoracic spine vertebra and contacts the descending thoracic aorta, without convincing change most recent PET-CT. No discrete mediastinal or hilar masses and no pathologically enlarged lymph nodes. Trachea is widely patent. Esophagus is unremarkable. No neck base or axillary masses or adenopathy. Lungs/Pleura: Small right pleural effusion. Opacity the anterolateral right upper lobe and in the right middle lobe, consistent with atelectasis, is stable from the prior PET-CT. Some pleural based opacity along the anterior right upper lobe more superiorly has mildly improved. Mild pleural base parenchymal opacity is noted in the right lower lobe, most likely atelectasis, mildly improved from the PET-CT. Left lung is clear. No left pleural effusion. No pneumothorax. Musculoskeletal: There is thickening of the right anterior inferior chest wall musculature extending superiorly from the right sixth rib costal cartilage to the right eighth rib costal cartilage. Adjacent to this, at the level of the eighth rib costal cartilage, there is a 14 mm irregular soft tissue nodule. These findings are new from the recent PET-CT. The muscular thickening measures 2.3 cm, anterior to posterior. No osteoblastic or osteolytic lesions. No fracture or acute finding. CT ABDOMEN PELVIS FINDINGS Hepatobiliary: Liver mildly enlarged, right lobe measuring 2.4 cm from superior inferior. No liver mass or focal lesion. There is irregular wall thickening of the gallbladder most evident along the fundus focally. No evidence of gallstones. No pericholecystic inflammation. No bile duct dilation. Pancreas: Unremarkable. No pancreatic ductal dilatation or surrounding inflammatory changes. Spleen: Spleen borderline enlarged measuring 11.5 x 5.5 x 11.8 cm, without significant change when compared to the most recent prior PET-CT. No splenic mass or focal  lesion. Adrenals/Urinary Tract: No adrenal masses. Subcentimeter lower pole cyst arises from the left kidney. No other renal masses, no stones and no hydronephrosis. Symmetric renal enhancement and excretion. Normal ureters. Bladder is unremarkable. Stomach/Bowel: No stomach, small bowel or colonic mass. No mesenteric masses. Bowel is normal in caliber. Stomach and small bowel are unremarkable. There are multiple left colon diverticula. No diverticulitis or other colonic inflammatory process. Normal appendix is visualized. Vascular/Lymphatic: Enlarged obturator node on  the left surrounds a left internal iliac artery branch. This measures 3.8 x 3.6 cm, increased from 2.7 x 2.0 cm on the prior PET-CT. There is an adjacent left external iliac chain lymph node measuring 17 mm in short axis, stable from the PET-CT. There are 2 adjacent right external iliac chain lymph nodes, largest measuring 2.8 x 1.8 cm, also without change. No other enlarged lymph nodes. Aorta is normal in caliber. No atherosclerosis. No significant vascular abnormality. Reproductive: Status post hysterectomy. No adnexal masses. Other: No abdominal wall hernia. There is a focus of increased attenuation in the subcutaneous fat of the right upper abdomen wall which may be from a soft tissue injection. This does not appear to be a discrete mass. Calcified soft tissue granuloma are noted in the left lateral upper chest wall subcutaneous fat. No ascites. Musculoskeletal: No osteoblastic or osteolytic lesions. IMPRESSION: 1. There is a new chest wall mass involving the musculature of the right, anterior inferior chest wall, spanning from approximately the right sixth through the right eighth rib cartilages. There is a new adjacent subcutaneous soft tissue mass measuring 14 mm. Findings are consistent with new areas of soft tissue lymphoma. 2. In the pelvis, there is enlargement of a left obturator lymph node when compared to the recent PET-CT, consistent  with progression lymphoma. 3. Previously described soft tissue along the descending thoracic aorta is without significant change from the most recent prior CT. 4. Small right pleural effusion and areas right lung pleural based opacity are similar to the PET-CT, with subtle areas of improvement of some of the pleural-based right lung opacity. 5. There is mild irregular wall thickening noted along the gallbladder mostly at the fundus. This could be a benign etiology, specifically adenomyomatosis. Infiltration of the gallbladder wall with lymphoma is possible. Consider further assessment with limited right upper quadrant ultrasound. 6. Mild hepatomegaly and borderline enlargement of the spleen is stable from the recent PET-CT. Electronically Signed   By: Lajean Manes M.D.   On: 02/06/2017 14:28   Mr Brain Wo Contrast  Result Date: 02/06/2017 CLINICAL DATA:  Numbness or tingling, paresthesia. Numbness involves the chin and left lower extremity. History of non-Hodgkin's lymphoma. EXAM: MRI HEAD WITHOUT CONTRAST TECHNIQUE: Multiplanar, multiecho pulse sequences of the brain and surrounding structures were obtained without intravenous contrast. COMPARISON:  None. FINDINGS: Brain: Incomplete study with diffusion, sagittal T1, axial FLAIR, axial T2, and axial gradient acquired. No acute infarct, blood products, hydrocephalus, or masslike finding. There is FLAIR hyperintensity about the atria of the lateral ventricles where there is mild apparent ependymal bulging towards the ventricles. This is likely due to slice angle given the symmetric appearance and lack of restricted diffusion in this area. These signal changes are usually seen with chronic small vessel ischemia. No suspected mass. Partially empty sella Vascular: Major flow voids are preserved. Skull and upper cervical spine: No evidence of marrow lesion. Sinuses/Orbits: Negative IMPRESSION: 1. Partial, noncontrast brain MRI due to patient discomfort. 2. No acute  finding or explanation for symptoms. Electronically Signed   By: Monte Fantasia M.D.   On: 02/06/2017 13:18   Ct Abdomen Pelvis W Contrast  Result Date: 02/06/2017 CLINICAL DATA:  H/o diffuse large B cell lymphoma dx'd 9/18, last chemo 12/18 Lt groin, leg, foot pain & numbness since 1/19 Recurrent rt pleural effusion EXAM: CT CHEST, ABDOMEN, AND PELVIS WITH CONTRAST TECHNIQUE: Multidetector CT imaging of the chest, abdomen and pelvis was performed following the standard protocol during bolus administration of intravenous contrast.  CONTRAST:  100 mL of Isovue-300 intravenous contrast COMPARISON:  PET-CT, 01/05/2017 and 10/21/2016. FINDINGS: CT CHEST FINDINGS Cardiovascular: Heart is normal in size. No pericardial effusion. No significant coronary artery calcifications. Aorta is normal in caliber. No aortic dissection or atherosclerosis. Mild prominence of the main pulmonary artery measuring 3.5 cm. Mediastinum/Nodes: Ill-defined low-attenuation soft tissue lies adjacent to the lower thoracic spine vertebra and contacts the descending thoracic aorta, without convincing change most recent PET-CT. No discrete mediastinal or hilar masses and no pathologically enlarged lymph nodes. Trachea is widely patent. Esophagus is unremarkable. No neck base or axillary masses or adenopathy. Lungs/Pleura: Small right pleural effusion. Opacity the anterolateral right upper lobe and in the right middle lobe, consistent with atelectasis, is stable from the prior PET-CT. Some pleural based opacity along the anterior right upper lobe more superiorly has mildly improved. Mild pleural base parenchymal opacity is noted in the right lower lobe, most likely atelectasis, mildly improved from the PET-CT. Left lung is clear. No left pleural effusion. No pneumothorax. Musculoskeletal: There is thickening of the right anterior inferior chest wall musculature extending superiorly from the right sixth rib costal cartilage to the right eighth rib  costal cartilage. Adjacent to this, at the level of the eighth rib costal cartilage, there is a 14 mm irregular soft tissue nodule. These findings are new from the recent PET-CT. The muscular thickening measures 2.3 cm, anterior to posterior. No osteoblastic or osteolytic lesions. No fracture or acute finding. CT ABDOMEN PELVIS FINDINGS Hepatobiliary: Liver mildly enlarged, right lobe measuring 2.4 cm from superior inferior. No liver mass or focal lesion. There is irregular wall thickening of the gallbladder most evident along the fundus focally. No evidence of gallstones. No pericholecystic inflammation. No bile duct dilation. Pancreas: Unremarkable. No pancreatic ductal dilatation or surrounding inflammatory changes. Spleen: Spleen borderline enlarged measuring 11.5 x 5.5 x 11.8 cm, without significant change when compared to the most recent prior PET-CT. No splenic mass or focal lesion. Adrenals/Urinary Tract: No adrenal masses. Subcentimeter lower pole cyst arises from the left kidney. No other renal masses, no stones and no hydronephrosis. Symmetric renal enhancement and excretion. Normal ureters. Bladder is unremarkable. Stomach/Bowel: No stomach, small bowel or colonic mass. No mesenteric masses. Bowel is normal in caliber. Stomach and small bowel are unremarkable. There are multiple left colon diverticula. No diverticulitis or other colonic inflammatory process. Normal appendix is visualized. Vascular/Lymphatic: Enlarged obturator node on the left surrounds a left internal iliac artery branch. This measures 3.8 x 3.6 cm, increased from 2.7 x 2.0 cm on the prior PET-CT. There is an adjacent left external iliac chain lymph node measuring 17 mm in short axis, stable from the PET-CT. There are 2 adjacent right external iliac chain lymph nodes, largest measuring 2.8 x 1.8 cm, also without change. No other enlarged lymph nodes. Aorta is normal in caliber. No atherosclerosis. No significant vascular abnormality.  Reproductive: Status post hysterectomy. No adnexal masses. Other: No abdominal wall hernia. There is a focus of increased attenuation in the subcutaneous fat of the right upper abdomen wall which may be from a soft tissue injection. This does not appear to be a discrete mass. Calcified soft tissue granuloma are noted in the left lateral upper chest wall subcutaneous fat. No ascites. Musculoskeletal: No osteoblastic or osteolytic lesions. IMPRESSION: 1. There is a new chest wall mass involving the musculature of the right, anterior inferior chest wall, spanning from approximately the right sixth through the right eighth rib cartilages. There is a new adjacent  subcutaneous soft tissue mass measuring 14 mm. Findings are consistent with new areas of soft tissue lymphoma. 2. In the pelvis, there is enlargement of a left obturator lymph node when compared to the recent PET-CT, consistent with progression lymphoma. 3. Previously described soft tissue along the descending thoracic aorta is without significant change from the most recent prior CT. 4. Small right pleural effusion and areas right lung pleural based opacity are similar to the PET-CT, with subtle areas of improvement of some of the pleural-based right lung opacity. 5. There is mild irregular wall thickening noted along the gallbladder mostly at the fundus. This could be a benign etiology, specifically adenomyomatosis. Infiltration of the gallbladder wall with lymphoma is possible. Consider further assessment with limited right upper quadrant ultrasound. 6. Mild hepatomegaly and borderline enlargement of the spleen is stable from the recent PET-CT. Electronically Signed   By: Lajean Manes M.D.   On: 02/06/2017 14:28   Ct Biopsy  Result Date: 01/23/2017 INDICATION: History of diffuse large B-cell lymphoma now with concern for recurrence. Please perform CT-guided biopsy of hypermetabolic right external iliac chain lymph node for tissue diagnostic purposes.  EXAM: CT-GUIDED BIOPSY OF RIGHT EXTERNAL ILIAC CHAIN LYMPH NODE COMPARISON:  PET-CT - 01/05/2017 MEDICATIONS: None. ANESTHESIA/SEDATION: Fentanyl 100 mcg IV; Versed 2 mg IV Sedation time: 16 minutes; The patient was continuously monitored during the procedure by the interventional radiology nurse under my direct supervision. CONTRAST:  None. COMPLICATIONS: None immediate. PROCEDURE: Informed consent was obtained from the patient following an explanation of the procedure, risks, benefits and alternatives. A time out was performed prior to the initiation of the procedure. The patient was positioned supine on the CT table and a limited CT was performed for procedural planning demonstrating unchanged size and appearance of known hypermetabolic right external iliac chain nodal conglomeration with dominant component measuring approximately 3.9 x 1.9 cm (image 18, series 3. The procedure was planned. The operative site was prepped and draped in the usual sterile fashion. Appropriate trajectory was confirmed with a 22 gauge spinal needle after the adjacent tissues were anesthetized with 1% Lidocaine with epinephrine. Under intermittent CT guidance, a 17 gauge coaxial needle was advanced into the peripheral aspect of the nodal conglomeration. Appropriate positioning was confirmed and 5 core needle biopsy samples were obtained with an 18 gauge core needle biopsy device. The co-axial needle was removed and hemostasis was achieved with manual compression. A limited postprocedural CT was negative for hemorrhage or additional complication. A dressing was placed. The patient tolerated the procedure well without immediate postprocedural complication. IMPRESSION: Technically successful CT guided core needle biopsy of hypermetabolic right external iliac chain nodal conglomeration. Electronically Signed   By: Sandi Mariscal M.D.   On: 01/23/2017 11:41   Vas [place] Lower Extremity Venous (dvt)  Result Date: 02/06/2017  Lower Venous Study  Indication: Pain. Examination Guidelines: A complete evaluation includes B-mode imaging, spectral doppler, color doppler, and power doppler as needed of all accessible portions of each vessel. Bilateral testing is considered an integral part of a complete examination. Limited examinations for reoccurring indications may be performed as noted. The reflux portion of the exam is performed with the patient in reverse Trendelenburg.  Limitations: Body habitus.  Right Venous Findings: +---+---------------+---------+-----------+----------+-------+    CompressibilityPhasicitySpontaneityPropertiesSummary +---+---------------+---------+-----------+----------+-------+ CFVFull           Yes      Yes                          +---+---------------+---------+-----------+----------+-------+  Left Venous Findings: +---------+---------------+---------+-----------+----------+-------+          CompressibilityPhasicitySpontaneityPropertiesSummary +---------+---------------+---------+-----------+----------+-------+ CFV      Full           Yes      Yes                          +---------+---------------+---------+-----------+----------+-------+ FV Prox  Full                                                 +---------+---------------+---------+-----------+----------+-------+ FV Mid   Full                                                 +---------+---------------+---------+-----------+----------+-------+ FV DistalFull                                                 +---------+---------------+---------+-----------+----------+-------+ PFV      Full                                                 +---------+---------------+---------+-----------+----------+-------+ POP      Full           Yes      Yes                          +---------+---------------+---------+-----------+----------+-------+ PTV      Full                                                  +---------+---------------+---------+-----------+----------+-------+ PERO     Full                                                 +---------+---------------+---------+-----------+----------+-------+    Final Interpretation: Right: No evidence of common femoral vein obstruction. Left: There is no evidence of deep vein thrombosis in the lower extremity.There is no evidence of superficial venous thrombosis. No cystic structure found in the popliteal fossa.  *See table(s) above for measurements and observations. Electronically signed by Harold Barban on 02/06/2017 at 10:36:16 AM.   Final    ASSESSMENT & PLAN:   67 y.o. 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  -PET scan skull base to thigh on 10/21/2016 with  results revealing: IMPRESSION:1. There has been significant interval improvement in the chest, abdomen, and pelvis in the interval. The dominant mass in the chest is much smaller and less FDG avid in the interval. However, the remaining maximum uptake is slightly greater than mediastinal blood pool. The dominant remaining disease in the pelvis is an external iliac lymph node with a maximum SUV moderately greater than liver blood pool. Evaluation of the bones is more difficult today given the diffuse uptake likely associated with recent chemotherapy. I suspect the previously identified humeral lesion remains. Recommend continued attention to the bones on follow-up. [place] Category 4  -PET scan skull base to thigh on 01/05/2017  r after 6 cycles of R-CHOP and IT MTXx 4 revealing: IMPRESSION:1. Mixed findings on the PET-CT. There are definite areas of significant continued improvement but also some new areas of involvement and slightly progressive pelvic adenopathy as detailed above. 2. Near complete resolution of the right pleural effusion. 3. Much improved osseous disease.  Bx of pelvic LN consistent with refractory DLBCL  BM BX - shows extensive BM  Involvement by large cell lymphoma (02/09/2017)  Plan: -Discussed pt labwork today; blood counts and chemistries are improving; Platelets up from 16k to 112k.  -Dexamethasone 40mg  daily from D1 to D4 with each cycle of chemotherapy, first thing in the morning with breakfast.  -we discussed considering switching to Cymbalta - but interaction noted with tramadol. Will adjust gabapentin dose to 300mg  po with breakfast and lunch and 600mg  at nightime (up slightly from 300mg  po TID) -given lasix 20mg  po daily for leg swelling (grade1) -Asked pt to let us or Dr. Shelia Media know if her leg swelling gets worse to order a water pill to balance out the steroids. -After discussion with Dr. Thera Flake, we will set up a PET scan for after C2.  -if refractory disease post C2 --will need to f/u with Dr Thera Flake for consideration of Car T-cell therapy. -if responding will continue bridging GCD with consideration of Auto HSCT at [facility]. -discussed rpt MRI brain and consideration of LP to further evaluate her numbness chin and r/o CNS disease-- patient wanted to hold off at this time.  3) Symptomatic anemia from BM involvement by lymphoma and chemotherapy  4) Severe thrombocytopenia PLT 25k BM involvement by lymphoma and chemotherapy BM revealed 90% present lymphoma, that she did not have a complete response to the first treatment and that the unresponsive parts of her lymphoma are severe, but still treatable with second line treatment PLAN -transfuse platelet prn for PLT<10k or issues with  bleeding -Platelets 03/09/17 at 112k.  -transfuse PRBC for hgb<8 or if symptomatic  #5 lower back pain radiating to left lower extremity MI L spine noted - Spondylosis worst at L4-5 where there is severe central canal stenosis and mild to moderate foraminal narrowing, worse on the right. Advanced facet degenerative disease at this level results in 0.3 cm anterolisthesis.  Right worse than left facet arthropathy at L5-S1 with an associated right facet joint effusion. Facet arthropathy and disc cause mild right foraminal narrowing at this level. The central canal and left foramen are open.  PLAN -Spine MRI shows lots of disk disease and nerve pinching but not lymphoma.  -monitor for excessive sedation given concern for sleep apnea -adjusted gabapentin dose -prn tramadol for uncontrolled pain -has ortho referral and PT referral from PCP   Please schedule remaining part of C2 and C3 of chemotherapy RTC with Dr Irene Limbo in 2 weeks with labs and in 3weeks  with C3D1  PET/CT in 18 days (before C3 of chemotherapy)   All of the patient's questions were answered with apparent satisfaction. The patient knows to call the clinic with any problems, questions or concerns.  . The total time spent in the appointment was 30 minutes and more than 50% was on counseling and direct patient cares and co-ordination of care with [place] [facility] Hematology/Oncology Physician [place]  (Office):       [phone] (Work cell):  [phone] (Fax):           [phone]  This document serves as a record of services personally performed by [place] Lone, MD. It was created on his behalf by Baldwin Jamaica, a trained medical scribe. The creation of this record is based on the scribe's personal observations and the provider's statements to them.   .I have reviewed the above documentation for accuracy and completeness, and I agree with the above. Brunetta Genera MD  MS

## 2017-03-09 ENCOUNTER — Inpatient Hospital Stay: Payer: Medicare Other

## 2017-03-09 ENCOUNTER — Encounter: Payer: Self-pay | Admitting: Hematology

## 2017-03-09 ENCOUNTER — Inpatient Hospital Stay (HOSPITAL_BASED_OUTPATIENT_CLINIC_OR_DEPARTMENT_OTHER): Payer: Medicare Other | Admitting: Hematology

## 2017-03-09 ENCOUNTER — Inpatient Hospital Stay: Payer: Medicare Other | Attending: Hematology

## 2017-03-09 ENCOUNTER — Other Ambulatory Visit: Payer: Self-pay | Admitting: Hematology

## 2017-03-09 VITALS — BP 109/81 | HR 77 | Temp 98.1°F | Resp 20 | Ht 61.0 in | Wt 254.3 lb

## 2017-03-09 DIAGNOSIS — D6959 Other secondary thrombocytopenia: Secondary | ICD-10-CM | POA: Diagnosis not present

## 2017-03-09 DIAGNOSIS — Z9221 Personal history of antineoplastic chemotherapy: Secondary | ICD-10-CM

## 2017-03-09 DIAGNOSIS — M7989 Other specified soft tissue disorders: Secondary | ICD-10-CM

## 2017-03-09 DIAGNOSIS — Z79899 Other long term (current) drug therapy: Secondary | ICD-10-CM | POA: Diagnosis not present

## 2017-03-09 DIAGNOSIS — C8338 Diffuse large B-cell lymphoma, lymph nodes of multiple sites: Secondary | ICD-10-CM | POA: Diagnosis not present

## 2017-03-09 DIAGNOSIS — H532 Diplopia: Secondary | ICD-10-CM | POA: Diagnosis not present

## 2017-03-09 DIAGNOSIS — H05221 Edema of right orbit: Secondary | ICD-10-CM | POA: Diagnosis not present

## 2017-03-09 DIAGNOSIS — Z5111 Encounter for antineoplastic chemotherapy: Secondary | ICD-10-CM | POA: Insufficient documentation

## 2017-03-09 DIAGNOSIS — D649 Anemia, unspecified: Secondary | ICD-10-CM

## 2017-03-09 DIAGNOSIS — N179 Acute kidney failure, unspecified: Secondary | ICD-10-CM | POA: Insufficient documentation

## 2017-03-09 DIAGNOSIS — R4182 Altered mental status, unspecified: Secondary | ICD-10-CM | POA: Diagnosis not present

## 2017-03-09 DIAGNOSIS — D630 Anemia in neoplastic disease: Secondary | ICD-10-CM | POA: Diagnosis not present

## 2017-03-09 LAB — COMPREHENSIVE METABOLIC PANEL
ALBUMIN: 2.7 g/dL — AB (ref 3.5–5.0)
ALT: 20 U/L (ref 0–55)
AST: 36 U/L — AB (ref 5–34)
Alkaline Phosphatase: 120 U/L (ref 40–150)
Anion gap: 15 — ABNORMAL HIGH (ref 3–11)
BUN: 13 mg/dL (ref 7–26)
CO2: 20 mmol/L — AB (ref 22–29)
CREATININE: 1.2 mg/dL — AB (ref 0.60–1.10)
Calcium: 9 mg/dL (ref 8.4–10.4)
Chloride: 106 mmol/L (ref 98–109)
GFR calc Af Amer: 53 mL/min — ABNORMAL LOW (ref >60)
GFR, EST NON AFRICAN AMERICAN: 46 mL/min — AB (ref >60)
GLUCOSE: 85 mg/dL (ref 70–140)
Potassium: 3.8 mmol/L (ref 3.5–5.1)
Sodium: 141 mmol/L (ref 136–145)
Total Bilirubin: 0.7 mg/dL (ref 0.2–1.2)
Total Protein: 5.6 g/dL — ABNORMAL LOW (ref 6.4–8.3)

## 2017-03-09 LAB — CBC WITH DIFFERENTIAL (CANCER CENTER ONLY)
BASOS ABS: 0.3 10*3/uL — AB (ref 0.0–0.1)
Basophils Relative: 1 %
EOS ABS: 0 10*3/uL (ref 0.0–0.5)
Eosinophils Relative: 0 %
HEMATOCRIT: 28.9 % — AB (ref 34.8–46.6)
Hemoglobin: 9.1 g/dL — ABNORMAL LOW (ref 11.6–15.9)
LYMPHS ABS: 1.8 10*3/uL (ref 0.9–3.3)
LYMPHS PCT: 8 %
MCH: 27.1 pg (ref 25.1–34.0)
MCHC: 31.5 g/dL (ref 31.5–36.0)
MCV: 86 fL (ref 79.5–101.0)
Monocytes Absolute: 2.8 10*3/uL — ABNORMAL HIGH (ref 0.1–0.9)
Monocytes Relative: 13 %
NEUTROS PCT: 78 %
NRBC: 1 /100{WBCs} — AB
Neutro Abs: 17 10*3/uL — ABNORMAL HIGH (ref 1.5–6.5)
PLATELETS: 112 10*3/uL — AB (ref 145–400)
RBC: 3.36 MIL/uL — AB (ref 3.70–5.45)
RDW: 17.8 % — ABNORMAL HIGH (ref 11.2–14.5)
WBC: 21.9 10*3/uL — AB (ref 3.9–10.3)

## 2017-03-09 LAB — RETICULOCYTES
RBC.: 3.36 MIL/uL — ABNORMAL LOW (ref 3.70–5.45)
Retic Count, Absolute: 40.3 10*3/uL (ref 33.7–90.7)
Retic Ct Pct: 1.2 % (ref 0.7–2.1)

## 2017-03-09 LAB — LACTATE DEHYDROGENASE: LDH: 3354 U/L — AB (ref 125–245)

## 2017-03-09 MED ORDER — DEXAMETHASONE 4 MG PO TABS: 4.0000 mg | ORAL_TABLET | Freq: Every day | ORAL | 0 refills | Status: DC

## 2017-03-09 MED ORDER — SODIUM CHLORIDE 0.9 % IV SOLN
Freq: Once | INTRAVENOUS | Status: AC
  Administered 2017-03-09: 13:00:00 via INTRAVENOUS

## 2017-03-09 MED ORDER — DEXAMETHASONE SODIUM PHOSPHATE 10 MG/ML IJ SOLN
10.0000 mg | Freq: Once | INTRAMUSCULAR | Status: AC
  Administered 2017-03-09: 10 mg via INTRAVENOUS

## 2017-03-09 MED ORDER — PALONOSETRON HCL INJECTION 0.25 MG/5ML
0.2500 mg | Freq: Once | INTRAVENOUS | Status: AC
  Administered 2017-03-09: 0.25 mg via INTRAVENOUS

## 2017-03-09 MED ORDER — SODIUM CHLORIDE 0.9% FLUSH
10.0000 mL | INTRAVENOUS | Status: DC | PRN
  Administered 2017-03-09: 10 mL
  Filled 2017-03-09: qty 10

## 2017-03-09 MED ORDER — SODIUM CHLORIDE 0.9 % IV SOLN
800.0000 mg/m2 | Freq: Once | INTRAVENOUS | Status: AC
  Administered 2017-03-09: 1786 mg via INTRAVENOUS
  Filled 2017-03-09: qty 46.97

## 2017-03-09 MED ORDER — HEPARIN SOD (PORK) LOCK FLUSH 100 UNIT/ML IV SOLN
500.0000 [IU] | Freq: Once | INTRAVENOUS | Status: AC | PRN
  Administered 2017-03-09: 500 [IU]
  Filled 2017-03-09: qty 5

## 2017-03-09 MED ORDER — DEXAMETHASONE SODIUM PHOSPHATE 10 MG/ML IJ SOLN
INTRAMUSCULAR | Status: AC
  Filled 2017-03-09: qty 1

## 2017-03-09 MED ORDER — SODIUM CHLORIDE 0.9 % IV SOLN
543.5000 mg | Freq: Once | INTRAVENOUS | Status: AC
  Administered 2017-03-09: 540 mg via INTRAVENOUS
  Filled 2017-03-09: qty 54

## 2017-03-09 MED ORDER — DEXAMETHASONE 4 MG PO TABS: ORAL_TABLET | ORAL | 2 refills | Status: DC

## 2017-03-09 MED ORDER — GABAPENTIN 300 MG PO CAPS: ORAL_CAPSULE | ORAL | 0 refills | Status: AC

## 2017-03-09 MED ORDER — DEXAMETHASONE 4 MG PO TABS: ORAL_TABLET | ORAL | 2 refills | Status: AC

## 2017-03-09 MED ORDER — PALONOSETRON HCL INJECTION 0.25 MG/5ML
INTRAVENOUS | Status: AC
  Filled 2017-03-09: qty 5

## 2017-03-09 MED ORDER — SODIUM CHLORIDE 0.9 % IV SOLN: 10.0000 mg | Freq: Once | INTRAVENOUS | Status: DC

## 2017-03-09 MED ORDER — FUROSEMIDE 20 MG PO TABS: 20.0000 mg | ORAL_TABLET | Freq: Every day | ORAL | 1 refills | Status: AC

## 2017-03-09 NOTE — Patient Instructions (Signed)
Thank you for choosing [facility] to provide your oncology and hematology care.  To afford each patient quality time with our providers, please arrive 30 minutes before your scheduled appointment time.  If you arrive late for your appointment, you may be asked to reschedule.  We strive to give you quality time with our providers, and arriving late affects you and other patients whose appointments are after yours.   If you are a no show for multiple scheduled visits, you may be dismissed from the clinic at the providers discretion.    Again, thank you for choosing [facility], our hope is that these requests will decrease the amount of time that you wait before being seen by our physicians.  ______________________________________________________________________  Should you have questions after your visit to the [facility], please contact our office at [phone] between the hours of 8:30 and 4:30 p.m.    Voicemails left after 4:30p.m will not be returned until the following business day.    For prescription refill requests, please have your pharmacy contact us directly.  Please also try to allow 48 hours for prescription requests.    Please contact the scheduling department for questions regarding scheduling.  For scheduling of procedures such as PET scans, CT scans, MRI, Ultrasound, etc please contact central scheduling at [phone].    Resources For Cancer Patients and Caregivers:   Oncolink.org:  A wonderful resource for patients and healthcare providers for information regarding your disease, ways to tract your treatment, what to expect, etc.     American Cancer Society:  800-227-2345  Can help patients locate various types of support and financial assistance  Cancer Care: 1-800-813-HOPE (4673) Provides financial assistance, online support groups, medication/co-pay assistance.    [place] DSS:  336-641-3447 Where to apply for food  stamps, Medicaid, and utility assistance  Medicare Rights Center: 800-333-4114 Helps people with Medicare understand their rights and benefits, navigate the Medicare system, and secure the quality healthcare they deserve  SCAT: 336-333-6589 [place] [employer]'s shared-ride transportation service for eligible riders who have a disability that prevents them from riding the fixed route bus.    For additional information on assistance programs please contact our social worker:   Grier Hock/Abigail Elmore:  [phone]            

## 2017-03-09 NOTE — Patient Instructions (Signed)
[facility] Discharge Instructions for Patients Receiving Chemotherapy  Today you received the following chemotherapy agents: Gemzar and Carboplatin  To help prevent nausea and vomiting after your treatment, we encourage you to take your nausea medication as prescribed.    If you develop nausea and vomiting that is not controlled by your nausea medication, call the clinic.   BELOW ARE SYMPTOMS THAT SHOULD BE REPORTED IMMEDIATELY:  *FEVER GREATER THAN 100.5 F  *CHILLS WITH OR WITHOUT FEVER  NAUSEA AND VOMITING THAT IS NOT CONTROLLED WITH YOUR NAUSEA MEDICATION  *UNUSUAL SHORTNESS OF BREATH  *UNUSUAL BRUISING OR BLEEDING  TENDERNESS IN MOUTH AND THROAT WITH OR WITHOUT PRESENCE OF ULCERS  *URINARY PROBLEMS  *BOWEL PROBLEMS  UNUSUAL RASH Items with * indicate a potential emergency and should be followed up as soon as possible.  Feel free to call the clinic should you have any questions or concerns. The clinic phone number is [phone].  Please show the CHEMO ALERT CARD at check-in to the Emergency Department and triage nurse.   

## 2017-03-10 ENCOUNTER — Ambulatory Visit: Payer: Medicare Other

## 2017-03-12 ENCOUNTER — Telehealth: Payer: Self-pay

## 2017-03-12 NOTE — Telephone Encounter (Signed)
Called 2 different number to contact patient with new appointment dates due to the day these appointment are scheduled. Will mail a calender of these changes. Per 3/11 los

## 2017-03-12 NOTE — Telephone Encounter (Signed)
Confirmed with pt dosage and frequency of gabapentin. Pt verbalized that the label says, "Take 1 tablet (300mg ) at breakfast and 2 tablets (600mg ) at bedtime. Per signature and Dr. Irene Limbo confirmed, pt to take 1 tablet (300mg ) at breakfast and lunch. Take 2 tablets (600mg ) at bedtime. Pt verbalized understanding. Pt additionally received a call regarding her 3/15 appt. Pt unaware if this call was to change the appt to 3/15 or to another day. Told pt this RN would f/u and call her back tomorrow.

## 2017-03-16 ENCOUNTER — Inpatient Hospital Stay (HOSPITAL_COMMUNITY)
Admission: AD | Admit: 2017-03-16 | Discharge: 2017-03-21 | DRG: 682 | Disposition: A | Discharge status: Home / Self Care | Payer: Medicare Other | Source: Ambulatory Visit | Attending: Internal Medicine | Admitting: Internal Medicine

## 2017-03-16 ENCOUNTER — Telehealth: Payer: Self-pay

## 2017-03-16 ENCOUNTER — Other Ambulatory Visit: Payer: Self-pay

## 2017-03-16 ENCOUNTER — Inpatient Hospital Stay: Payer: Medicare Other

## 2017-03-16 ENCOUNTER — Encounter (HOSPITAL_COMMUNITY): Payer: Self-pay | Admitting: *Deleted

## 2017-03-16 ENCOUNTER — Telehealth: Payer: Self-pay | Admitting: Oncology

## 2017-03-16 VITALS — BP 98/62 | HR 67 | Temp 98.3°F | Resp 16

## 2017-03-16 DIAGNOSIS — D649 Anemia, unspecified: Secondary | ICD-10-CM

## 2017-03-16 DIAGNOSIS — T451X5A Adverse effect of antineoplastic and immunosuppressive drugs, initial encounter: Secondary | ICD-10-CM | POA: Diagnosis not present

## 2017-03-16 DIAGNOSIS — C8228 Follicular lymphoma grade III, unspecified, lymph nodes of multiple sites: Secondary | ICD-10-CM

## 2017-03-16 DIAGNOSIS — C8330 Diffuse large B-cell lymphoma, unspecified site: Secondary | ICD-10-CM | POA: Diagnosis present

## 2017-03-16 DIAGNOSIS — M5442 Lumbago with sciatica, left side: Secondary | ICD-10-CM | POA: Diagnosis present

## 2017-03-16 DIAGNOSIS — M7989 Other specified soft tissue disorders: Secondary | ICD-10-CM

## 2017-03-16 DIAGNOSIS — D61818 Other pancytopenia: Secondary | ICD-10-CM | POA: Diagnosis not present

## 2017-03-16 DIAGNOSIS — N179 Acute kidney failure, unspecified: Secondary | ICD-10-CM

## 2017-03-16 DIAGNOSIS — Z9221 Personal history of antineoplastic chemotherapy: Secondary | ICD-10-CM | POA: Diagnosis not present

## 2017-03-16 DIAGNOSIS — Z79899 Other long term (current) drug therapy: Secondary | ICD-10-CM

## 2017-03-16 DIAGNOSIS — M4690 Unspecified inflammatory spondylopathy, site unspecified: Secondary | ICD-10-CM | POA: Diagnosis present

## 2017-03-16 DIAGNOSIS — I4891 Unspecified atrial fibrillation: Secondary | ICD-10-CM

## 2017-03-16 DIAGNOSIS — C8338 Diffuse large B-cell lymphoma, lymph nodes of multiple sites: Secondary | ICD-10-CM

## 2017-03-16 DIAGNOSIS — D630 Anemia in neoplastic disease: Secondary | ICD-10-CM | POA: Diagnosis not present

## 2017-03-16 DIAGNOSIS — Z6841 Body Mass Index (BMI) 40.0 and over, adult: Secondary | ICD-10-CM

## 2017-03-16 DIAGNOSIS — I480 Paroxysmal atrial fibrillation: Secondary | ICD-10-CM | POA: Diagnosis present

## 2017-03-16 DIAGNOSIS — E860 Dehydration: Secondary | ICD-10-CM | POA: Diagnosis present

## 2017-03-16 DIAGNOSIS — R0602 Shortness of breath: Secondary | ICD-10-CM

## 2017-03-16 DIAGNOSIS — R197 Diarrhea, unspecified: Secondary | ICD-10-CM | POA: Diagnosis present

## 2017-03-16 DIAGNOSIS — D6959 Other secondary thrombocytopenia: Secondary | ICD-10-CM | POA: Diagnosis not present

## 2017-03-16 DIAGNOSIS — E883 Tumor lysis syndrome: Secondary | ICD-10-CM

## 2017-03-16 DIAGNOSIS — D61810 Antineoplastic chemotherapy induced pancytopenia: Secondary | ICD-10-CM | POA: Diagnosis present

## 2017-03-16 DIAGNOSIS — M48061 Spinal stenosis, lumbar region without neurogenic claudication: Secondary | ICD-10-CM | POA: Diagnosis present

## 2017-03-16 DIAGNOSIS — Z87891 Personal history of nicotine dependence: Secondary | ICD-10-CM

## 2017-03-16 DIAGNOSIS — K219 Gastro-esophageal reflux disease without esophagitis: Secondary | ICD-10-CM | POA: Diagnosis present

## 2017-03-16 DIAGNOSIS — M79672 Pain in left foot: Secondary | ICD-10-CM | POA: Diagnosis present

## 2017-03-16 DIAGNOSIS — G8929 Other chronic pain: Secondary | ICD-10-CM | POA: Diagnosis present

## 2017-03-16 DIAGNOSIS — D696 Thrombocytopenia, unspecified: Secondary | ICD-10-CM | POA: Diagnosis present

## 2017-03-16 DIAGNOSIS — G4730 Sleep apnea, unspecified: Secondary | ICD-10-CM | POA: Diagnosis present

## 2017-03-16 DIAGNOSIS — C8590 Non-Hodgkin lymphoma, unspecified, unspecified site: Secondary | ICD-10-CM | POA: Diagnosis present

## 2017-03-16 DIAGNOSIS — D6481 Anemia due to antineoplastic chemotherapy: Secondary | ICD-10-CM | POA: Diagnosis present

## 2017-03-16 DIAGNOSIS — Z7982 Long term (current) use of aspirin: Secondary | ICD-10-CM

## 2017-03-16 DIAGNOSIS — R590 Localized enlarged lymph nodes: Secondary | ICD-10-CM | POA: Diagnosis present

## 2017-03-16 DIAGNOSIS — D701 Agranulocytosis secondary to cancer chemotherapy: Secondary | ICD-10-CM | POA: Diagnosis present

## 2017-03-16 LAB — COMPREHENSIVE METABOLIC PANEL
ALT: 54 U/L (ref 0–55)
ANION GAP: 14 — AB (ref 3–11)
AST: 71 U/L — ABNORMAL HIGH (ref 5–34)
Albumin: 3 g/dL — ABNORMAL LOW (ref 3.5–5.0)
Alkaline Phosphatase: 67 U/L (ref 40–150)
BUN: 62 mg/dL — ABNORMAL HIGH (ref 7–26)
CALCIUM: 8.7 mg/dL (ref 8.4–10.4)
CO2: 18 mmol/L — ABNORMAL LOW (ref 22–29)
Chloride: 115 mmol/L — ABNORMAL HIGH (ref 98–109)
Creatinine, Ser: 2.92 mg/dL — ABNORMAL HIGH (ref 0.60–1.10)
GFR, EST AFRICAN AMERICAN: 18 mL/min — AB (ref >60)
GFR, EST NON AFRICAN AMERICAN: 16 mL/min — AB (ref >60)
Glucose, Bld: 93 mg/dL (ref 70–140)
Potassium: 4.7 mmol/L (ref 3.5–5.1)
Sodium: 147 mmol/L — ABNORMAL HIGH (ref 136–145)
Total Bilirubin: 0.5 mg/dL (ref 0.2–1.2)
Total Protein: 6.1 g/dL — ABNORMAL LOW (ref 6.4–8.3)

## 2017-03-16 LAB — LACTATE DEHYDROGENASE: LDH: 1856 U/L — AB (ref 125–245)

## 2017-03-16 LAB — CBC WITH DIFFERENTIAL (CANCER CENTER ONLY)
BASOS ABS: 0 10*3/uL (ref 0.0–0.1)
BASOS PCT: 0 %
EOS ABS: 0 10*3/uL (ref 0.0–0.5)
EOS PCT: 0 %
HCT: 24.6 % — ABNORMAL LOW (ref 34.8–46.6)
Hemoglobin: 7.6 g/dL — ABNORMAL LOW (ref 11.6–15.9)
Lymphocytes Relative: 21 %
Lymphs Abs: 0.1 10*3/uL — ABNORMAL LOW (ref 0.9–3.3)
MCH: 26.4 pg (ref 25.1–34.0)
MCHC: 30.9 g/dL — ABNORMAL LOW (ref 31.5–36.0)
MCV: 85.4 fL (ref 79.5–101.0)
Monocytes Absolute: 0 10*3/uL — ABNORMAL LOW (ref 0.1–0.9)
Monocytes Relative: 2 %
NEUTROS PCT: 77 %
Neutro Abs: 0.5 10*3/uL — CL (ref 1.5–6.5)
PLATELETS: 16 10*3/uL — AB (ref 145–400)
RBC: 2.88 MIL/uL — AB (ref 3.70–5.45)
RDW: 17.7 % — ABNORMAL HIGH (ref 11.2–14.5)
WBC: 0.6 10*3/uL — AB (ref 3.9–10.3)

## 2017-03-16 LAB — RETICULOCYTES: RBC.: UNDETERMINED MIL/uL (ref 3.70–5.45)

## 2017-03-16 LAB — URIC ACID: URIC ACID, SERUM: 11.3 mg/dL — AB (ref 2.6–7.4)

## 2017-03-16 LAB — PREPARE RBC (CROSSMATCH)

## 2017-03-16 MED ORDER — SODIUM CHLORIDE 0.9 % IV SOLN
Freq: Once | INTRAVENOUS | Status: AC
  Administered 2017-03-16: 16:00:00 via INTRAVENOUS

## 2017-03-16 MED ORDER — SODIUM CHLORIDE 0.9 % IV SOLN
6.0000 mg | Freq: Once | INTRAVENOUS | Status: AC
  Administered 2017-03-17: 6 mg via INTRAVENOUS
  Filled 2017-03-16: qty 4

## 2017-03-16 MED ORDER — ALTEPLASE 2 MG IJ SOLR
INTRAMUSCULAR | Status: AC
  Filled 2017-03-16: qty 2

## 2017-03-16 MED ORDER — DIPHENHYDRAMINE HCL 25 MG PO CAPS
25.0000 mg | ORAL_CAPSULE | Freq: Once | ORAL | Status: AC
  Administered 2017-03-16: 25 mg via ORAL

## 2017-03-16 MED ORDER — GABAPENTIN 300 MG PO CAPS
300.0000 mg | ORAL_CAPSULE | Freq: Every day | ORAL | Status: DC
  Administered 2017-03-17 – 2017-03-21 (×5): 300 mg via ORAL
  Filled 2017-03-16 (×5): qty 1

## 2017-03-16 MED ORDER — PEGFILGRASTIM-CBQV 6 MG/0.6ML ~~LOC~~ SOSY
6.0000 mg | PREFILLED_SYRINGE | Freq: Once | SUBCUTANEOUS | Status: AC
  Administered 2017-03-16: 6 mg via SUBCUTANEOUS

## 2017-03-16 MED ORDER — ACETAMINOPHEN 325 MG PO TABS
650.0000 mg | ORAL_TABLET | Freq: Once | ORAL | Status: AC
  Administered 2017-03-16: 650 mg via ORAL

## 2017-03-16 MED ORDER — DIPHENHYDRAMINE HCL 25 MG PO CAPS
ORAL_CAPSULE | ORAL | Status: AC
  Filled 2017-03-16: qty 2

## 2017-03-16 MED ORDER — SODIUM CHLORIDE 0.9% FLUSH
10.0000 mL | INTRAVENOUS | Status: AC | PRN
  Administered 2017-03-16: 10 mL

## 2017-03-16 MED ORDER — SODIUM CHLORIDE 0.9% FLUSH: 10.0000 mL | INTRAVENOUS | Status: DC | PRN

## 2017-03-16 MED ORDER — PROCHLORPERAZINE MALEATE 10 MG PO TABS
ORAL_TABLET | ORAL | Status: AC
Start: 2017-03-16 — End: ?
  Filled 2017-03-16: qty 1

## 2017-03-16 MED ORDER — ACETAMINOPHEN 325 MG PO TABS
ORAL_TABLET | ORAL | Status: AC
  Filled 2017-03-16: qty 2

## 2017-03-16 MED ORDER — MORPHINE SULFATE (PF) 2 MG/ML IV SOLN: 1.0000 mg | INTRAVENOUS | Status: DC | PRN

## 2017-03-16 MED ORDER — GABAPENTIN 300 MG PO CAPS
600.0000 mg | ORAL_CAPSULE | Freq: Every day | ORAL | Status: DC
  Administered 2017-03-16 – 2017-03-20 (×5): 600 mg via ORAL
  Filled 2017-03-16 (×5): qty 2

## 2017-03-16 MED ORDER — PEGFILGRASTIM-CBQV 6 MG/0.6ML ~~LOC~~ SOSY
PREFILLED_SYRINGE | SUBCUTANEOUS | Status: AC
  Filled 2017-03-16: qty 0.6

## 2017-03-16 MED ORDER — TRAZODONE HCL 50 MG PO TABS
50.0000 mg | ORAL_TABLET | Freq: Once | ORAL | Status: AC
  Administered 2017-03-16: 50 mg via ORAL
  Filled 2017-03-16: qty 1

## 2017-03-16 MED ORDER — SODIUM CHLORIDE 0.9 % IV SOLN
INTRAVENOUS | Status: DC
  Administered 2017-03-16: 100 mL/h via INTRAVENOUS

## 2017-03-16 MED ORDER — TRAMADOL HCL 50 MG PO TABS
50.0000 mg | ORAL_TABLET | Freq: Four times a day (QID) | ORAL | Status: DC | PRN
  Administered 2017-03-18 – 2017-03-21 (×4): 50 mg via ORAL
  Filled 2017-03-16 (×4): qty 1

## 2017-03-16 MED ORDER — ALTEPLASE 2 MG IJ SOLR
2.0000 mg | Freq: Once | INTRAMUSCULAR | Status: AC
  Administered 2017-03-16: 2 mg
  Filled 2017-03-16: qty 2

## 2017-03-16 MED ORDER — ALLOPURINOL 100 MG PO TABS
100.0000 mg | ORAL_TABLET | Freq: Every day | ORAL | Status: DC
  Administered 2017-03-17 (×2): 100 mg via ORAL
  Filled 2017-03-16 (×2): qty 1

## 2017-03-16 NOTE — Progress Notes (Signed)
Patient is questioning receiving Elitek, does understand why she has to take it at the cancer center.  It was explained but she is still wondering about it.  Text sent to NP oncall because she is asking about this.

## 2017-03-16 NOTE — Progress Notes (Signed)
Patient received into room 1528 via wheelchair. On room air. Alert and oriented. Donne Hazel, RN

## 2017-03-16 NOTE — Telephone Encounter (Signed)
Pt requested for biopsy results to be sent to [facility] in r/t appt. Sent to [phone]. Confirmed fax receipt 03/16/17 at 1425.

## 2017-03-16 NOTE — Telephone Encounter (Signed)
Treatment cancelled today d/t lab work and pt symptoms expressed during assessment by Rodney Langton, RN. Pt moderately fatigued with increasing use of walker assist d/t L leg pain worsening since BMX. Rituxan and gemzar postponed, 1U PRBCs ordered outpatient. Per Dr. Irene Limbo, pt to be admitted for AKI, symptomatic anemia, and thrombocytopenia. Report given to hospitalist, Dr. Karleen Hampshire. Additionally, pt to received IVF, more blood and platelets, with potential rasburicase. Pt bed became available on [facility]: Room 1528. Report called to [place], Therapist, sports. RN verbalized okay to receive pt at this time. Spoke with Kathlee Nations, RN in infusion and pt to be transferred through admitting and up to 1528.

## 2017-03-16 NOTE — Progress Notes (Signed)
Dr. Irene Limbo aware of Hgb, ANC, and platelets. Will hold Gemzar and transfuse 1 unit PRBC's.

## 2017-03-16 NOTE — Progress Notes (Signed)
rm 1528, 5W, [place], 67.  with is Lymphoma patient, Just completed 2nd Unit PRBC and HR is tachy at 144 to 160, BP 92/72. No distress.  Rechecked 10 min  later at 2010 and vitals have returned to baseline at T 98.7, P86  R16, BP 93/68 Sats 100% on rm.

## 2017-03-16 NOTE — Progress Notes (Signed)
Dr Karleen Hampshire paged as [place] orders do not transfer over. Per RN report, patient due for another unit of blood. Awaiting orders.  Donne Hazel, RN

## 2017-03-16 NOTE — Telephone Encounter (Signed)
03/16/17 4:17 PM Per 03/13/17 Scheduling IB message from Alyson Ingles, patient's 3/22 appts were cancelled and a lab + MD appt was added to 4/11.  Called patient, who is hospitalized now and spoke to caregiver.  Aware of above information.  Also noting that since patient is hospitalized through tomorrow, that her injection appt in the cancer center was canceled for tomorrow.

## 2017-03-16 NOTE — Patient Instructions (Signed)
[facility] Discharge Instructions for Patients Receiving Chemotherapy  Today you received the following chemotherapy agents:  Gemzar and Rituxan.  To help prevent nausea and vomiting after your treatment, we encourage you to take your nausea medication as directed.   If you develop nausea and vomiting that is not controlled by your nausea medication, call the clinic.   BELOW ARE SYMPTOMS THAT SHOULD BE REPORTED IMMEDIATELY:  *FEVER GREATER THAN 100.5 F  *CHILLS WITH OR WITHOUT FEVER  NAUSEA AND VOMITING THAT IS NOT CONTROLLED WITH YOUR NAUSEA MEDICATION  *UNUSUAL SHORTNESS OF BREATH  *UNUSUAL BRUISING OR BLEEDING  TENDERNESS IN MOUTH AND THROAT WITH OR WITHOUT PRESENCE OF ULCERS  *URINARY PROBLEMS  *BOWEL PROBLEMS  UNUSUAL RASH Items with * indicate a potential emergency and should be followed up as soon as possible.  Feel free to call the clinic should you have any questions or concerns. The clinic phone number is [phone].  Please show the [place] at check-in to the Emergency Department and triage nurse.

## 2017-03-16 NOTE — H&P (Addendum)
History and Physical    Lula Kolton NAT:[id number] DOB: [birth year] DOA: 03/16/2017  PCP: Deland Pretty, MD  Patient coming from: Home.   I have personally briefly reviewed patient's old medical records in [place]  Chief Complaint: transfer from cancer center for pancytopenia and ARF.   HPI: Jo Collins is a 67 y.o. female with medical history significant of relapsed diffuse large B cell lymphoma, sciatica , GERD, anemia, chronic back pain,  Morbid obesity, last chemo on 3/8  Presented to cancer center for another cycle of chemo, but was found to be dehydrated, pancytopenic, and in acute renal failure. She was referred to [facility] as a direct admission for the evaluation of the above. Her lab work revealed sodium of 147, creatinine of 2.92, chloride of 115, BUN  OF 62, uric acid of 11.3 LDH of 1856, anc of 500, hemoglobin of 7.6, and platelets of 16,000. Pt reports generalized weakness, with chronic back pain. No sob or fever, chills, chest pain or cough, abdominal pain or nausea , vomiting.  She reports watery diarrhea the last 3 days not associated with abdominal pain.    She denies any headache, but has severe sciatica on the left requiring pain meds. She was evaluated with an MRI of the spine and she was found to have severe spinal stenosis. She was referred to orthopedics as outpatient and has yet to see the physician. For now its controlled with home pain meds.    Review of Systems: As per HPI otherwise 10 point review of systems negative.    Past Medical History:  Diagnosis Date  . Abdominal pain, RUQ   . Abnormal EKG 08/07/2016  . Acute midline thoracic back pain   . Acute respiratory distress 09/25/2016  . Anemia 08/07/2016  . Chronic low back pain 09/25/2016  . Cough   . DLBCL (diffuse large B cell lymphoma) ([place]) 08/17/2016  . GERD (gastroesophageal reflux disease)   . History of chemotherapy   . Hypokalemia 09/25/2016  . 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 Lymphadenopathy   .  Morbid obesity with BMI of 50.0-59.9, adult ([place])   . Neutrophilic leukocytosis 42/07/621  . NHL (non-Hodgkin's lymphoma) ([place])   . Pleural effusion on right 08/07/2016  . Recurrent pleural effusion on right 10/08/2016  . Recurrent right pleural effusion 09/25/2016  . S/P thoracentesis   . Shortness of breath 08/07/2016  . SOB (shortness of breath) 09/26/2016    Past Surgical History:  Procedure Laterality Date  . ABDOMINAL HYSTERECTOMY    . IR CV LINE INJECTION  10/26/2016  . IR FLUORO GUIDE PORT INSERTION RIGHT  08/18/2016  . IR FLUORO GUIDE PORT INSERTION RIGHT  11/03/2016  . IR REMOVAL TUN CV CATH W/O FL  11/03/2016  . IR THORACENTESIS ASP PLEURAL SPACE W/IMG GUIDE  08/07/2016  . IR THORACENTESIS ASP PLEURAL SPACE W/IMG GUIDE  08/14/2016  . IR US GUIDE VASC ACCESS RIGHT  08/18/2016  . IR US GUIDE VASC ACCESS RIGHT  11/03/2016     reports that she quit smoking about 7 months ago. Her smoking use included cigarettes. She has a 23.50 pack-year smoking history. she has never used smokeless tobacco. She reports that she does not drink alcohol or use drugs.  No Known Allergies  Family History  Problem Relation Age of Onset  . COPD Mother   . Diabetes Mellitus II Sister   . Diabetes Mellitus II Brother   . Diabetes Mellitus II Sister   . Stroke Sister   . Hypertension  Sister   . Diabetes Mellitus II Sister    Family history reviewed and not pertinent.   Prior to Admission medications   Medication Sig Start Date End Date Taking? Authorizing Provider  acetaminophen (TYLENOL) 500 MG tablet Take 1,000 mg by mouth every 6 (six) hours as needed for moderate pain.   Yes [provider]  aspirin EC 81 MG tablet Take 1 tablet (81 mg total) by mouth daily. 01/08/17  Yes Fay Records, MD  dexamethasone (DECADRON) 4 MG tablet 40mg  po daily with breakfast from D1 to D4 each cycle of chemotherapy 03/09/17  Yes Brunetta Genera, MD  gabapentin (NEURONTIN) 300 MG capsule 300mg  po with breakfast  and lunch and 600mg  at bedtime. 03/09/17  Yes Brunetta Genera, MD  furosemide (LASIX) 20 MG tablet Take 1 tablet (20 mg total) by mouth daily. Patient not taking: Reported on 03/16/2017 03/09/17   Brunetta Genera, MD    Physical Exam: Vitals:   03/16/17 1435  BP: 115/80  Pulse: 63  Resp: 16  Temp: 98.6 F (37 C)  TempSrc: Oral  SpO2: 100%    Constitutional: NAD, calm, comfortable Vitals:   03/16/17 1435  BP: 115/80  Pulse: 63  Resp: 16  Temp: 98.6 F (37 C)  TempSrc: Oral  SpO2: 100%   Eyes: PERRL, lids and conjunctivae normal ENMT: Mucous membranes are dry. .Normal dentition.  Neck: normal, supple,  Respiratory: clear to auscultation bilaterally, no wheezing, no crackles. Normal respiratory effort. No accessory muscle use.  Cardiovascular: Regular rate and rhythm, no murmurs / rubs / gallops. No extremity edema.  Abdomen: no tenderness, no masses palpated. No hepatosplenomegaly. Bowel sounds positive.  Musculoskeletal:  Trace pedal edema present.  Skin: no rashes, lesions, ulcers. No induration Neurologic: CN 2-12 grossly intact. Sensation intact, DTR normal. Strength 5/5 in all 4.  Psychiatric: Normal judgment and insight. Alert and oriented x 3. Normal mood.     Labs on Admission: I have personally reviewed following labs and imaging studies  CBC: Recent Labs  Lab 03/16/17 0753  WBC 0.6*  NEUTROABS 0.5*  HCT 24.6*  MCV 85.4  PLT 16*   Basic Metabolic Panel: Recent Labs  Lab 03/16/17 0753  NA 147*  K 4.7  CL 115*  CO2 18*  GLUCOSE 93  BUN 62*  CREATININE 2.92*  CALCIUM 8.7   GFR: Estimated Creatinine Clearance: 22.4 mL/min (A) (by C-G formula based on SCr of 2.92 mg/dL (H)). Liver Function Tests: Recent Labs  Lab 03/16/17 0753  AST 71*  ALT 54  ALKPHOS 67  BILITOT 0.5  PROT 6.1*  ALBUMIN 3.0*   No results for input(s): LIPASE, AMYLASE in the last 168 hours. No results for input(s): AMMONIA in the last 168 hours. Coagulation  Profile: No results for input(s): INR, PROTIME in the last 168 hours. Cardiac Enzymes: No results for input(s): CKTOTAL, CKMB, CKMBINDEX, TROPONINI in the last 168 hours. BNP (last 3 results) No results for input(s): PROBNP in the last 8760 hours. HbA1C: No results for input(s): HGBA1C in the last 72 hours. CBG: No results for input(s): GLUCAP in the last 168 hours. Lipid Profile: No results for input(s): CHOL, HDL, LDLCALC, TRIG, CHOLHDL, LDLDIRECT in the last 72 hours. Thyroid Function Tests: No results for input(s): TSH, T4TOTAL, FREET4, T3FREE, THYROIDAB in the last 72 hours. Anemia Panel: Recent Labs    03/16/17 0753  RETICCTPCT <0.4*   Urine analysis:    Component Value Date/Time   COLORURINE AMBER (A) 02/09/2017 0345  APPEARANCEUR CLOUDY (A) 02/09/2017 0345   LABSPEC 1.038 (H) 02/09/2017 0345   PHURINE 5.0 02/09/2017 0345   GLUCOSEU NEGATIVE 02/09/2017 0345   HGBUR SMALL (A) 02/09/2017 0345   BILIRUBINUR NEGATIVE 02/09/2017 0345   KETONESUR 5 (A) 02/09/2017 0345   PROTEINUR 100 (A) 02/09/2017 0345   NITRITE NEGATIVE 02/09/2017 0345   LEUKOCYTESUR NEGATIVE 02/09/2017 0345    Radiological Exams on Admission: No results found.  EKG: not done   Assessment/Plan Active Problems:   Pancytopenia (HCC)   Pancytopenia:  Probably from chemo last week. neulasta injection given today.  2 units of prbc transfusion and one unit of platelets given as per oncology recommendation.  Monitor counts daily.     Relapsed Large B CELL lymphoma:  Follows up with Dr Irene Limbo and further recommendations as per Dr Irene Limbo. Last chemo on 3/8.  She has an app[facility] at [facility] for evaluation of  BM transplant.    ARF/ ? Tumour lysis syndrome: Elevated uric acid, LDH,  Hydrate and repeat renal parameters in am.  She denies any dysuria or hematuria.  UA ordered.   Sciatica:  Severe spinal stenosis  at the level of L4 and L5,. On gabapentin and outpatient follow up with orthopedics. Referral  already made.   GERD: Stable.    Morbid obesity:  Outpatient follow up with PCP.   DVT prophylaxis: scd's Code Status: full code. Family Communication: family at bedside. ) Disposition Plan: pending resolution of dehydration, ARF and once counts improve. Consults called: oncology Dr Irene Limbo Admission status: obs/med surg.   Hosie Poisson MD [facility] Pager [phone]  If 7PM-7AM, please contact night-coverage [email or web address] Password [facility]  03/16/2017, 3:35 PM

## 2017-03-17 ENCOUNTER — Observation Stay (HOSPITAL_COMMUNITY): Payer: Medicare Other

## 2017-03-17 ENCOUNTER — Ambulatory Visit: Payer: Medicare Other

## 2017-03-17 ENCOUNTER — Encounter (HOSPITAL_COMMUNITY): Payer: Self-pay

## 2017-03-17 DIAGNOSIS — K219 Gastro-esophageal reflux disease without esophagitis: Secondary | ICD-10-CM | POA: Diagnosis not present

## 2017-03-17 DIAGNOSIS — D649 Anemia, unspecified: Secondary | ICD-10-CM

## 2017-03-17 DIAGNOSIS — N170 Acute kidney failure with tubular necrosis: Secondary | ICD-10-CM

## 2017-03-17 DIAGNOSIS — D630 Anemia in neoplastic disease: Secondary | ICD-10-CM | POA: Diagnosis not present

## 2017-03-17 DIAGNOSIS — M79672 Pain in left foot: Secondary | ICD-10-CM

## 2017-03-17 DIAGNOSIS — E883 Tumor lysis syndrome: Secondary | ICD-10-CM

## 2017-03-17 DIAGNOSIS — T451X5A Adverse effect of antineoplastic and immunosuppressive drugs, initial encounter: Secondary | ICD-10-CM

## 2017-03-17 DIAGNOSIS — D6959 Other secondary thrombocytopenia: Secondary | ICD-10-CM | POA: Diagnosis not present

## 2017-03-17 DIAGNOSIS — R0602 Shortness of breath: Secondary | ICD-10-CM

## 2017-03-17 DIAGNOSIS — C8338 Diffuse large B-cell lymphoma, lymph nodes of multiple sites: Secondary | ICD-10-CM | POA: Diagnosis not present

## 2017-03-17 DIAGNOSIS — M7989 Other specified soft tissue disorders: Secondary | ICD-10-CM | POA: Diagnosis not present

## 2017-03-17 DIAGNOSIS — Z79899 Other long term (current) drug therapy: Secondary | ICD-10-CM | POA: Diagnosis not present

## 2017-03-17 DIAGNOSIS — N179 Acute kidney failure, unspecified: Secondary | ICD-10-CM | POA: Diagnosis not present

## 2017-03-17 DIAGNOSIS — Z9221 Personal history of antineoplastic chemotherapy: Secondary | ICD-10-CM | POA: Diagnosis not present

## 2017-03-17 LAB — URINALYSIS, ROUTINE W REFLEX MICROSCOPIC
BILIRUBIN URINE: NEGATIVE
GLUCOSE, UA: NEGATIVE mg/dL
KETONES UR: NEGATIVE mg/dL
NITRITE: NEGATIVE
PROTEIN: 30 mg/dL — AB
Specific Gravity, Urine: 1.011 (ref 1.005–1.030)
pH: 6 (ref 5.0–8.0)

## 2017-03-17 LAB — BASIC METABOLIC PANEL
ANION GAP: 12 (ref 5–15)
BUN: 48 mg/dL — ABNORMAL HIGH (ref 6–20)
CHLORIDE: 114 mmol/L — AB (ref 101–111)
CO2: 21 mmol/L — AB (ref 22–32)
Calcium: 8 mg/dL — ABNORMAL LOW (ref 8.9–10.3)
Creatinine, Ser: 2.49 mg/dL — ABNORMAL HIGH (ref 0.44–1.00)
GFR calc non Af Amer: 19 mL/min — ABNORMAL LOW (ref >60)
GFR, EST AFRICAN AMERICAN: 22 mL/min — AB (ref >60)
Glucose, Bld: 95 mg/dL (ref 65–99)
Potassium: 3.9 mmol/L (ref 3.5–5.1)
Sodium: 147 mmol/L — ABNORMAL HIGH (ref 135–145)

## 2017-03-17 LAB — CBC WITH DIFFERENTIAL/PLATELET
BAND NEUTROPHILS: 13 %
BASOS ABS: 0 10*3/uL (ref 0.0–0.1)
BASOS PCT: 0 %
Blasts: 0 %
EOS ABS: 0 10*3/uL (ref 0.0–0.7)
EOS PCT: 0 %
HCT: 25.5 % — ABNORMAL LOW (ref 36.0–46.0)
Hemoglobin: 8.2 g/dL — ABNORMAL LOW (ref 12.0–15.0)
LYMPHS ABS: 0.2 10*3/uL — AB (ref 0.7–4.0)
Lymphocytes Relative: 11 %
MCH: 27.2 pg (ref 26.0–34.0)
MCHC: 32.2 g/dL (ref 30.0–36.0)
MCV: 84.4 fL (ref 78.0–100.0)
MONO ABS: 0 10*3/uL — AB (ref 0.1–1.0)
MYELOCYTES: 0 %
Metamyelocytes Relative: 0 %
Monocytes Relative: 0 %
NEUTROS ABS: 2 10*3/uL (ref 1.7–7.7)
Neutrophils Relative %: 76 %
Other: 0 %
PLATELETS: 31 10*3/uL — AB (ref 150–400)
Promyelocytes Absolute: 0 %
RBC: 3.02 MIL/uL — ABNORMAL LOW (ref 3.87–5.11)
RDW: 16.6 % — AB (ref 11.5–15.5)
WBC: 2.2 10*3/uL — AB (ref 4.0–10.5)
nRBC: 0 /100 WBC

## 2017-03-17 LAB — PREPARE PLATELET PHERESIS: UNIT DIVISION: 0

## 2017-03-17 LAB — BPAM PLATELET PHERESIS
Blood Product Expiration Date: 201903172359
ISSUE DATE / TIME: 201903152130
UNIT TYPE AND RH: 6200

## 2017-03-17 LAB — PREPARE RBC (CROSSMATCH)

## 2017-03-17 LAB — URIC ACID: Uric Acid, Serum: 8.8 mg/dL — ABNORMAL HIGH (ref 2.3–6.6)

## 2017-03-17 LAB — LACTATE DEHYDROGENASE: LDH: 1220 U/L — ABNORMAL HIGH (ref 98–192)

## 2017-03-17 MED ORDER — ACETAMINOPHEN 325 MG PO TABS
650.0000 mg | ORAL_TABLET | Freq: Once | ORAL | Status: AC
  Administered 2017-03-17: 650 mg via ORAL
  Filled 2017-03-17: qty 2

## 2017-03-17 MED ORDER — SODIUM CHLORIDE 0.9 % IV SOLN: 6.0000 mg | Freq: Once | INTRAVENOUS | Status: DC

## 2017-03-17 MED ORDER — METHYLPREDNISOLONE SODIUM SUCC 40 MG IJ SOLR
40.0000 mg | Freq: Once | INTRAMUSCULAR | Status: AC
  Administered 2017-03-17: 40 mg via INTRAVENOUS
  Filled 2017-03-17: qty 1

## 2017-03-17 MED ORDER — DEXTROSE 5 % IV SOLN
INTRAVENOUS | Status: DC
  Administered 2017-03-17 – 2017-03-18 (×2): via INTRAVENOUS
  Filled 2017-03-17 (×3): qty 100

## 2017-03-17 MED ORDER — TRAZODONE HCL 50 MG PO TABS
50.0000 mg | ORAL_TABLET | Freq: Once | ORAL | Status: AC
  Administered 2017-03-17: 50 mg via ORAL
  Filled 2017-03-17: qty 1

## 2017-03-17 MED ORDER — HEPARIN SOD (PORK) LOCK FLUSH 100 UNIT/ML IV SOLN
250.0000 [IU] | INTRAVENOUS | Status: DC | PRN
  Filled 2017-03-17: qty 2.5

## 2017-03-17 MED ORDER — DIPHENHYDRAMINE HCL 25 MG PO CAPS
25.0000 mg | ORAL_CAPSULE | Freq: Once | ORAL | Status: AC
  Administered 2017-03-17: 25 mg via ORAL
  Filled 2017-03-17: qty 1

## 2017-03-17 MED ORDER — ALTEPLASE 2 MG IJ SOLR
2.0000 mg | Freq: Once | INTRAMUSCULAR | Status: AC
  Administered 2017-03-17: 2 mg
  Filled 2017-03-17: qty 2

## 2017-03-17 MED ORDER — SODIUM CHLORIDE 0.9% FLUSH: 3.0000 mL | INTRAVENOUS | Status: DC | PRN

## 2017-03-17 MED ORDER — LACTATED RINGERS IV SOLN: INTRAVENOUS | Status: DC

## 2017-03-17 MED ORDER — SODIUM CHLORIDE 0.9% FLUSH
10.0000 mL | INTRAVENOUS | Status: AC | PRN
  Administered 2017-03-20: 20 mL

## 2017-03-17 MED ORDER — SODIUM CHLORIDE 0.9 % IV SOLN: 250.0000 mL | Freq: Once | INTRAVENOUS | Status: DC

## 2017-03-17 MED ORDER — RASBURICASE 1.5 MG IV SOLR: 6.0000 mg | Freq: Once | INTRAVENOUS | Status: DC

## 2017-03-17 MED ORDER — HEPARIN SOD (PORK) LOCK FLUSH 100 UNIT/ML IV SOLN
500.0000 [IU] | Freq: Every day | INTRAVENOUS | Status: DC | PRN
  Filled 2017-03-17: qty 5

## 2017-03-17 MED ORDER — ALLOPURINOL 100 MG PO TABS
100.0000 mg | ORAL_TABLET | Freq: Two times a day (BID) | ORAL | Status: DC
  Administered 2017-03-17 – 2017-03-21 (×8): 100 mg via ORAL
  Filled 2017-03-17 (×9): qty 1

## 2017-03-17 NOTE — Progress Notes (Signed)
@IPLOG @        PROGRESS NOTE                                                                                                                                                                                                             Patient Demographics:    Jo Collins, is a 67 y.o. female, DOB - 05-Jun-1950, OZD:[id number]  Admit date - 03/16/2017   Admitting Physician Hosie Poisson, MD  Outpatient Primary MD for the patient is Deland Pretty, MD  LOS - 1  No chief complaint on file.      Brief Narrative   Jo Collins is a 67 y.o. female with medical history significant of relapsed diffuse large B cell lymphoma, sciatica , GERD, anemia, chronic back pain,  Morbid obesity, last chemo on 3/8  Presented to cancer center for another cycle of chemo, but was found to be dehydrated, pancytopenic, and in acute renal failure, workup suggested that she had tumor lysis syndrome along with some diarrhea prior to coming to the hospital.   Subjective:    Jo Collins today has, No headache, No chest pain, No abdominal pain - No Nausea, No new weakness tingling or numbness, No Cough - SOB.     Assessment  & Plan :     1.  ARF caused by combination of tumor lysis syndrome, elevated uric acid along with diarrhea induced dehydration.  Continue IV fluids, continue allopurinol along with a dose of Elitek, patient had refused the medication yesterday but now after counseling she is agreeable.  Monitor uric acid levels along with renal function.  She has good urine output.  Continue to monitor.  2.  Relapsed large B-cell lymphoma.  Undergoing chemotherapy under the care of Dr. Irene Limbo, pancytopenia induced by chemotherapy, continue supportive care.  Last chemo on 03/09/2017.  She also has follow-up with [facility] for possible bone marrow transplant.  3.  Pancytopenia.  #2 above.  4.  Chronic back pain.  Due to severe spinal stenosis at L4-L5.  Supportive care.  5.  GERD -  stable no acute issues.    Diet :  Diet regular Room service appropriate? Yes; Fluid consistency: Thin    Family Communication  :  none  Code Status :  Full  Disposition Plan  : Home when Renal function has stabilized  Consults  :  None  Procedures  :    DVT Prophylaxis  :    Heparin    Lab Results  Component Value Date   PLT 31 (L)  03/17/2017    Inpatient Medications  Scheduled Meds: . acetaminophen  650 mg Oral Once  . allopurinol  100 mg Oral BID  . diphenhydrAMINE  25 mg Oral Once  . gabapentin  300 mg Oral Daily  . gabapentin  600 mg Oral QHS  . methylPREDNISolone (SOLU-MEDROL) injection  40 mg Intravenous Once   Continuous Infusions: . rasburicase (ELITEK) IV infusion    .  sodium bicarbonate  infusion 1000 mL     PRN Meds:.heparin lock flush, heparin lock flush, morphine injection, sodium chloride flush, traMADol  Antibiotics  :    Anti-infectives (From admission, onward)   None         Objective:   Vitals:   03/16/17 2255 03/16/17 2337 03/17/17 0547 03/17/17 0815  BP: 111/75 (!) 102/56 112/64   Pulse: 92 87 76   Resp: 16 16 16    Temp: 99.6 F (37.6 C) 99 F (37.2 C) 98.8 F (37.1 C)   TempSrc: Oral Oral Oral   SpO2: 100% 100% 97%   Weight:    115.7 kg (255 lb)  Height:    5\' 1"  (1.549 m)    Wt Readings from Last 3 Encounters:  03/17/17 115.7 kg (255 lb)  03/09/17 115.3 kg (254 lb 4.8 oz)  03/06/17 114.6 kg (252 lb 9.6 oz)     Intake/Output Summary (Last 24 hours) at 03/17/2017 1316 Last data filed at 03/17/2017 0900 Gross per 24 hour  Intake 1128.67 ml  Output 1030 ml  Net 98.67 ml     Physical Exam  Awake Alert, Oriented X 3, No new F.N deficits, Normal affect [place].AT,PERRAL Supple Neck,No JVD, No cervical lymphadenopathy appriciated.  Symmetrical Chest wall movement, Good air movement bilaterally, CTAB RRR,No Gallops,Rubs or new Murmurs, No Parasternal Heave +ve B.Sounds, Abd Soft, No tenderness, No organomegaly appriciated, No rebound - guarding or rigidity. No  Cyanosis, Clubbing or edema, No new Rash or bruise      Data Review:    CBC Recent Labs  Lab 03/16/17 0753 03/17/17 0458  WBC 0.6* 2.2*  HGB  --  8.2*  HCT 24.6* 25.5*  PLT 16* 31*  MCV 85.4 84.4  MCH 26.4 27.2  MCHC 30.9* 32.2  RDW 17.7* 16.6*  LYMPHSABS 0.1* 0.2*  MONOABS 0.0* 0.0*  EOSABS 0.0 0.0  BASOSABS 0.0 0.0    Chemistries  Recent Labs  Lab 03/16/17 0753 03/17/17 0458  NA 147* 147*  K 4.7 3.9  CL 115* 114*  CO2 18* 21*  GLUCOSE 93 95  BUN 62* 48*  CREATININE 2.92* 2.49*  CALCIUM 8.7 8.0*  AST 71*  --   ALT 54  --   ALKPHOS 67  --   BILITOT 0.5  --    ------------------------------------------------------------------------------------------------------------------ No results for input(s): CHOL, HDL, LDLCALC, TRIG, CHOLHDL, LDLDIRECT in the last 72 hours.  No results found for: HGBA1C ------------------------------------------------------------------------------------------------------------------ No results for input(s): TSH, T4TOTAL, T3FREE, THYROIDAB in the last 72 hours.  Invalid input(s): FREET3 ------------------------------------------------------------------------------------------------------------------ Recent Labs    03/16/17 0753  RETICCTPCT <0.4*    Coagulation profile No results for input(s): INR, PROTIME in the last 168 hours.  No results for input(s): DDIMER in the last 72 hours.  Cardiac Enzymes No results for input(s): CKMB, TROPONINI, MYOGLOBIN in the last 168 hours.  Invalid input(s): CK ------------------------------------------------------------------------------------------------------------------    Component Value Date/Time   BNP 75.9 10/07/2016 2300    Micro Results No results found for this or any previous visit (from the past 240 hour(s)).  Radiology Reports Dg Chest Port 1 View  Result Date: 03/17/2017 CLINICAL DATA:  SOB today EXAM: PORTABLE CHEST 1 VIEW COMPARISON:  CT 02/06/2017 FINDINGS: Port in the  anterior chest wall with tip in distal SVC. Normal cardiac silhouette. There is RIGHT pleural effusion and atelectasis. Upper lungs clear. No pneumothorax. IMPRESSION: 1. Persistent RIGHT pleural effusion and RIGHT basilar scarring. Chronic findings. 2. LEFT lung clear Electronically Signed   By: Suzy Bouchard M.D.   On: 03/17/2017 07:37    Time Spent in minutes  30   Lala Lund M.D on 03/17/2017 at 1:16 PM  Between 7am to 7pm - Pager - [phone] ( page via [email or web address], text pages only, please mention full 10 digit call back number). After 7pm go to [email or web address] - password [facility]

## 2017-03-17 NOTE — Progress Notes (Signed)
Patient has refused to get Elitek infusion until she receives more/further  explanation from the Doctors  before she takes this medication. Medication will be returned to pharmacy.

## 2017-03-17 NOTE — Progress Notes (Signed)
[name]   HEMATOLOGY/ONCOLOGY INPATIENT PROGRESS NOTE  Date of Service: 03/17/2017  Inpatient Attending: .Thurnell Lose, MD   SUBJECTIVE  Patient was here for her C2D8 of R-GCD and was seen by me in the infusion room.  She noted significant fatigue and was noted to have symptomatic anemia with a hemoglobin of 7.6. No overt findings of bleeding .  Also noted to have severe thrombocytopenia with platelet counts of 16k.  Significant bump in her creatinine to nearly 3.  Uric acid was done and was elevated concerning for tumor lysis syndrome.  She was also noted to have neutropenia and was given the Neulasta shot that she was due for. No fevers.  Her day 8 gemcitabine and Rituxan were held and she was admitted to the hospitalist service for transfusion support IV fluids and management of tumor lysis syndrome.  I discussed the care plan in details with Dr. Karleen Hampshire.  Patient understands needs for hospitalization.   OBJECTIVE: Fatigued appearing  PHYSICAL EXAMINATION: . Vitals:   03/16/17 2255 03/16/17 2337 03/17/17 0547 03/17/17 0815  BP: 111/75 (!) 102/56 112/64   Pulse: 92 87 76   Resp: 16 16 16    Temp: 99.6 F (37.6 C) 99 F (37.2 C) 98.8 F (37.1 C)   TempSrc: Oral Oral Oral   SpO2: 100% 100% 97%   Weight:    255 lb (115.7 kg)  Height:    5\' 1"  (1.549 m)   Filed Weights   03/17/17 0815  Weight: 255 lb (115.7 kg)   .Body mass index is 48.18 kg/m.  GENERAL:alert,fatigued appearing. SKIN: skin color, texture, turgor are normal, no rashes or significant lesions EYES: conjunctival pallor. Anicteric sclera. OROPHARYNX:no exudate, no erythema and lips, buccal mucosa, and tongue normal  NECK: supple, no JVD, thyroid normal size, non-tender, without nodularity LYMPH:  no palpable lymphadenopathy in the cervical, axillary or inguinal LUNGS: clear to auscultation with normal respiratory effort HEART: regular rate & rhythm,  no murmurs and no lower extremity edema ABDOMEN: abdomen soft,  non-tender, normoactive bowel sounds  Musculoskeletal: no cyanosis of digits and no clubbing  PSYCH: alert & oriented x 3 with fluent speech NEURO: no focal motor/sensory deficits  MEDICAL HISTORY:  Past Medical History:  Diagnosis Date  . Abdominal pain, RUQ   . Abnormal EKG 08/07/2016  . Acute midline thoracic back pain   . Acute respiratory distress 09/25/2016  . Anemia 08/07/2016  . Chronic low back pain 09/25/2016  . Cough   . DLBCL (diffuse large B cell lymphoma) ([place]) 08/17/2016  . GERD (gastroesophageal reflux disease)   . History of chemotherapy   . Hypokalemia 09/25/2016  . 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 Lymphadenopathy   . Morbid obesity with BMI of 50.0-59.9, adult ([place])   . Neutrophilic leukocytosis 31/05/4006  . NHL (non-Hodgkin's lymphoma) ([place])   . Pleural effusion on right 08/07/2016  . Recurrent pleural effusion on right 10/08/2016  . Recurrent right pleural effusion 09/25/2016  . S/P thoracentesis   . Shortness of breath 08/07/2016  . SOB (shortness of breath) 09/26/2016    SURGICAL HISTORY: Past Surgical History:  Procedure Laterality Date  . ABDOMINAL HYSTERECTOMY    . IR CV LINE INJECTION  10/26/2016  . IR FLUORO GUIDE PORT INSERTION RIGHT  08/18/2016  . IR FLUORO GUIDE PORT INSERTION RIGHT  11/03/2016  . IR REMOVAL TUN CV CATH W/O FL  11/03/2016  . IR THORACENTESIS ASP PLEURAL SPACE W/IMG GUIDE  08/07/2016  . IR THORACENTESIS ASP PLEURAL SPACE W/IMG  GUIDE  08/14/2016  . IR US GUIDE VASC ACCESS RIGHT  08/18/2016  . IR US GUIDE VASC ACCESS RIGHT  11/03/2016    SOCIAL HISTORY: Social History   Socioeconomic History  . Marital status: Married    Spouse name: Not on file  . Number of children: Not on file  . Years of education: Not on file  . Highest education level: Not on file  Social Needs  . Financial resource strain: Not hard at all  . Food insecurity - worry: Never true  . Food insecurity - inability: Never true  . Transportation needs - medical: No  .  Transportation needs - non-medical: No  Occupational History  . Not on file  Tobacco Use  . Smoking status: Former Smoker    Packs/day: 0.50    Years: 47.00    Pack years: 23.50    Types: Cigarettes    Last attempt to quit: 07/28/2016    Years since quitting: 0.6  . Smokeless tobacco: Never Used  Substance and Sexual Activity  . Alcohol use: No  . Drug use: No  . Sexual activity: Not on file  Other Topics Concern  . Not on file  Social History Narrative  . Not on file    FAMILY HISTORY: Family History  Problem Relation Age of Onset  . COPD Mother   . Diabetes Mellitus II Sister   . Diabetes Mellitus II Brother   . Diabetes Mellitus II Sister   . Stroke Sister   . Hypertension Sister   . Diabetes Mellitus II Sister     ALLERGIES:  has No Known Allergies.  MEDICATIONS:  Scheduled Meds: . allopurinol  100 mg Oral Daily  . gabapentin  300 mg Oral Daily  . gabapentin  600 mg Oral QHS   Continuous Infusions: . sodium chloride 100 mL/hr (03/16/17 2253)   PRN Meds:.morphine injection, sodium chloride flush, traMADol  REVIEW OF SYSTEMS:    10 Point review of Systems was done is negative except as noted above.   LABORATORY DATA:  I have reviewed the data as listed  . CBC Latest Ref Rng & Units 03/16/2017 03/09/2017  WBC 4.0 - 10.5 K/uL 0.6(LL) 21.9(H)  Hemoglobin 12.0 - 15.0 g/dL - -  Hematocrit 36.0 - 46.0 % 24.6(L) 28.9(L)  Platelets 150 - 400 K/uL 16(L) 112(L)  hgb  . CMP Latest Ref Rng & Units 03/16/2017 03/09/2017  Glucose 65 - 99 mg/dL 93 85  BUN 6 - 20 mg/dL 62(H) 13  Creatinine 0.44 - 1.00 mg/dL 2.92(H) 1.20(H)  Sodium 135 - 145 mmol/L 147(H) 141  Potassium 3.5 - 5.1 mmol/L 4.7 3.8  Chloride 101 - 111 mmol/L 115(H) 106  CO2 22 - 32 mmol/L 18(L) 20(L)  Calcium 8.9 - 10.3 mg/dL 8.7 9.0  Total Protein 6.4 - 8.3 g/dL 6.1(L) 5.6(L)  Total Bilirubin 0.2 - 1.2 mg/dL 0.5 0.7  Alkaline Phos 40 - 150 U/L 67 120  AST 5 - 34 U/L 71(H) 36(H)  ALT 0 - 55 U/L 54  20       RADIOGRAPHIC STUDIES: I have personally reviewed the radiological images as listed and agreed with the findings in the report. Dg Chest Port 1 View  Result Date: 03/17/2017 CLINICAL DATA:  SOB today EXAM: PORTABLE CHEST 1 VIEW COMPARISON:  CT 02/06/2017 FINDINGS: Port in the anterior chest wall with tip in distal SVC. Normal cardiac silhouette. There is RIGHT pleural effusion and atelectasis. Upper lungs clear. No pneumothorax. IMPRESSION: 1. Persistent RIGHT pleural  effusion and RIGHT basilar scarring. Chronic findings. 2. LEFT lung clear Electronically Signed   By: Suzy Bouchard M.D.   On: 03/17/2017 07:37    ASSESSMENT & PLAN:    67 y.o. 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  -PET scan skull base to thigh on 10/21/2016 with results revealing: IMPRESSION:1. There has been significant interval improvement in the chest, abdomen, and pelvis in the interval. The dominant mass in the chest is much smaller and less FDG avid in the interval. However, the remaining maximum uptake is slightly greater than mediastinal blood pool. The dominant remaining disease in the pelvis is an external iliac lymph node with a maximum SUV moderately greater than liver blood pool. Evaluation of the bones is more difficult today given the diffuse uptake likely associated with recent chemotherapy. I suspect the previously identified humeral lesion remains. Recommend continued attention to the bones on follow-up. [place] Category  4  -PET scan skull base to thigh on 01/05/2017 r after 6 cycles of R-CHOP and IT MTXx 4 revealing: IMPRESSION:1. Mixed findings on the PET-CT. There are definite areas of significant continued improvement but also some new areas of involvement and slightly progressive pelvic adenopathy as detailed above. 2. Near complete resolution of the right pleural effusion. 3. Much improved osseous disease.  Bx of pelvic LN consistent with refractory DLBCL  BM BX - shows extensive BM  Involvement by large cell lymphoma (02/09/2017)    3) Symptomatic anemia from BM involvement by lymphoma and chemotherapy. hgb 7.6  4) Severe thrombocytopenia PLT 16k BM involvement by lymphoma and chemotherapy  5) Neutropenia -related to chemotherapy and BM involvement with lymphoma  6) Elevated uric acid - TLS uric acid 11.2  7) Acute renal failure -- related to po dehydration + TLS + stress from symptomatic anemia.  Plan: -I discussed all the blood work results with the patient. -She has been admitted to the hospital as inpatient for addressing her symptom medic anemia, severe thrombocytopenia and acute renal failure from tumor lysis. -We will transfuse PRBCs to maintain hemoglobin of 9 given ongoing chemotherapy and limited bone marrow reserve from significant bone marrow involvement with lymphoma. -Platelet transfusion as needed for bleeding and prophylactically to maintain platelet counts of more than 10,000. -Might need 1 unit of platelets with 2 units of PRBCs given concern for delusional thrombocytopenia. -We held the her C2D8 rituximab and gemcitabine today . -IV fluids.  May need to alkalinize urine if needed  -Restarted on allopurinol (renal adjusted 100mg  po daily) -rasburicase 6 mg IV piggyback today -Daily CBC with differential, cmp and uric acid   #8 lower back pain radiating to left lower extremity MI L spine noted - Spondylosis worst at L4-5 where there is severe central canal stenosis and  mild to moderate foraminal narrowing, worse on the right. Advanced facet degenerative disease at this level results in 0.3 cm anterolisthesis.  Right worse than left facet arthropathy at L5-S1 with an associated right facet joint effusion. Facet arthropathy and disc cause mild right foraminal narrowing at this level. The central canal and left foramen are  open.  PLAN -gabapentin dose -prn tramadol for uncontrolled pain -has ortho referral and PT referral from PCP  Will continue to follow in the hospital      I spent 30 minutes counseling the patient face to face. The total time spent in the appointment was 40 minutes and more than 50% was on counseling and direct patient cares and co-ordination of cares with hospitalist Dr Karleen Hampshire.    Sullivan Lone MD [place] AAHIVMS [facility] Hematology/Oncology Physician [facility]  (Office):       [phone] (Work cell):  [phone] (Fax):           [phone]

## 2017-03-17 NOTE — Progress Notes (Addendum)
[name]   HEMATOLOGY/ONCOLOGY INPATIENT PROGRESS NOTE  Date of Service: 03/17/2017  Inpatient Attending: .Thurnell Lose, MD   SUBJECTIVE  Patient appears brighter today. Receiving IVF...did not receive the Rasburicase till this AM. Uric was down from 11.3 to 8.8 pre-Elitek. Hgb upto 8.2 after 1 unit of PRBC --ordered 1 more PLT up to 32k Neutropenia resolving with Neulasta. Encourage good po fluid intake and ambulation. Having significant left foot pain. - XRay foot ordered due to some local tenderness. Likely due to radiculopathy but will r/o local pathology/Stress fracture.  No fevers.  OBJECTIVE: Fatigued appearing  PHYSICAL EXAMINATION: . Vitals:   03/16/17 2255 03/16/17 2337 03/17/17 0547 03/17/17 0815  BP: 111/75 (!) 102/56 112/64   Pulse: 92 87 76   Resp: 16 16 16    Temp: 99.6 F (37.6 C) 99 F (37.2 C) 98.8 F (37.1 C)   TempSrc: Oral Oral Oral   SpO2: 100% 100% 97%   Weight:    255 lb (115.7 kg)  Height:    5\' 1"  (1.549 m)   Filed Weights   03/17/17 0815  Weight: 255 lb (115.7 kg)   .Body mass index is 48.18 kg/m.  [name] GENERAL:alert, in no acute distress and comfortable SKIN: no acute rashes, no significant lesions EYES: conjunctival pallor + , sclera anicteric OROPHARYNX: MMM, no exudates, no oropharyngeal erythema or ulceration NECK: supple, no JVD LYMPH:  no palpable lymphadenopathy in the cervical, axillary or inguinal regions LUNGS: clear to auscultation b/l with normal respiratory effort HEART: regular rate & rhythm ABDOMEN:  normoactive bowel sounds , non tender, not distended. Extremity:1+ pedal edema, left foot pain PSYCH: alert & oriented x 3 with fluent speech NEURO: no focal motor/sensory deficits  MEDICAL HISTORY:  Past Medical History:  Diagnosis Date  . Abdominal pain, RUQ   . Abnormal EKG 08/07/2016  . Acute midline thoracic back pain   . Acute respiratory distress 09/25/2016  . Anemia 08/07/2016  . Chronic low back pain 09/25/2016  .  Cough   . DLBCL (diffuse large B cell lymphoma) ([employer]) 08/17/2016  . GERD (gastroesophageal reflux disease)   . History of chemotherapy   . Hypokalemia 09/25/2016  . 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 Lymphadenopathy   . Morbid obesity with BMI of 50.0-59.9, adult ([place])   . Neutrophilic leukocytosis 65/07/8467  . NHL (non-Hodgkin's lymphoma) ([place])   . Pleural effusion on right 08/07/2016  . Recurrent pleural effusion on right 10/08/2016  . Recurrent right pleural effusion 09/25/2016  . S/P thoracentesis   . Shortness of breath 08/07/2016  . SOB (shortness of breath) 09/26/2016    SURGICAL HISTORY: Past Surgical History:  Procedure Laterality Date  . ABDOMINAL HYSTERECTOMY    . IR CV LINE INJECTION  10/26/2016  . IR FLUORO GUIDE PORT INSERTION RIGHT  08/18/2016  . IR FLUORO GUIDE PORT INSERTION RIGHT  11/03/2016  . IR REMOVAL TUN CV CATH W/O FL  11/03/2016  . IR THORACENTESIS ASP PLEURAL SPACE W/IMG GUIDE  08/07/2016  . IR THORACENTESIS ASP PLEURAL SPACE W/IMG GUIDE  08/14/2016  . IR US GUIDE VASC ACCESS RIGHT  08/18/2016  . IR US GUIDE VASC ACCESS RIGHT  11/03/2016    SOCIAL HISTORY: Social History   Socioeconomic History  . Marital status: Married    Spouse name: Not on file  . Number of children: Not on file  . Years of education: Not on file  . Highest education level: Not on file  Social Needs  . Financial resource strain: Not  hard at all  . Food insecurity - worry: Never true  . Food insecurity - inability: Never true  . Transportation needs - medical: No  . Transportation needs - non-medical: No  Occupational History  . Not on file  Tobacco Use  . Smoking status: Former Smoker    Packs/day: 0.50    Years: 47.00    Pack years: 23.50    Types: Cigarettes    Last attempt to quit: 07/28/2016    Years since quitting: 0.6  . Smokeless tobacco: Never Used  Substance and Sexual Activity  . Alcohol use: No  . Drug use: No  . Sexual activity: Not on file  Other Topics Concern  .  Not on file  Social History Narrative  . Not on file    FAMILY HISTORY: Family History  Problem Relation Age of Onset  . COPD Mother   . Diabetes Mellitus II Sister   . Diabetes Mellitus II Brother   . Diabetes Mellitus II Sister   . Stroke Sister   . Hypertension Sister   . Diabetes Mellitus II Sister     ALLERGIES:  has No Known Allergies.  MEDICATIONS:  Scheduled Meds: . acetaminophen  650 mg Oral Once  . allopurinol  100 mg Oral BID  . diphenhydrAMINE  25 mg Oral Once  . gabapentin  300 mg Oral Daily  . gabapentin  600 mg Oral QHS  . methylPREDNISolone (SOLU-MEDROL) injection  40 mg Intravenous Once   Continuous Infusions: . sodium chloride    .  sodium bicarbonate  infusion 1000 mL     PRN Meds:.heparin lock flush, heparin lock flush, morphine injection, sodium chloride flush, sodium chloride flush, sodium chloride flush, traMADol  REVIEW OF SYSTEMS:    10 Point review of Systems was done is negative except as noted above.   LABORATORY DATA:  I have reviewed the data as listed  .[name] CBC Latest Ref Rng & Units 03/17/2017 03/16/2017 03/09/2017  WBC 4.0 - 10.5 K/uL 2.2(L) 0.6(LL) 21.9(H)  Hemoglobin 12.0 - 15.0 g/dL 8.2(L) - -  Hematocrit 36.0 - 46.0 % 25.5(L) 24.6(L) 28.9(L)  Platelets 150 - 400 K/uL 31(L) 16(L) 112(L)  ANC 2k  . CMP Latest Ref Rng & Units 03/17/2017 03/16/2017 03/09/2017  Glucose 65 - 99 mg/dL 95 93 85  BUN 6 - 20 mg/dL 48(H) 62(H) 13  Creatinine 0.44 - 1.00 mg/dL 2.49(H) 2.92(H) 1.20(H)  Sodium 135 - 145 mmol/L 147(H) 147(H) 141  Potassium 3.5 - 5.1 mmol/L 3.9 4.7 3.8  Chloride 101 - 111 mmol/L 114(H) 115(H) 106  CO2 22 - 32 mmol/L 21(L) 18(L) 20(L)  Calcium 8.9 - 10.3 mg/dL 8.0(L) 8.7 9.0  Total Protein 6.4 - 8.3 g/dL - 6.1(L) 5.6(L)  Total Bilirubin 0.2 - 1.2 mg/dL - 0.5 0.7  Alkaline Phos 40 - 150 U/L - 67 120  AST 5 - 34 U/L - 71(H) 36(H)  ALT 0 - 55 U/L - 54 20    . Lab Results  Component Value Date   LDH 1,220 (H) 03/17/2017         RADIOGRAPHIC STUDIES: I have personally reviewed the radiological images as listed and agreed with the findings in the report. Dg Chest Port 1 View  Result Date: 03/17/2017 CLINICAL DATA:  SOB today EXAM: PORTABLE CHEST 1 VIEW COMPARISON:  CT 02/06/2017 FINDINGS: Port in the anterior chest wall with tip in distal SVC. Normal cardiac silhouette. There is RIGHT pleural effusion and atelectasis. Upper lungs clear. No pneumothorax. IMPRESSION:  1. Persistent RIGHT pleural effusion and RIGHT basilar scarring. Chronic findings. 2. LEFT lung clear Electronically Signed   By: Suzy Bouchard M.D.   On: 03/17/2017 07:37    ASSESSMENT & PLAN:    67 y.o. 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  -PET scan skull base to thigh on 10/21/2016 with results revealing: IMPRESSION:1. There has been significant interval improvement in the chest, abdomen, and pelvis in the interval. The dominant mass in the chest is much smaller and less FDG avid in the interval. However, the remaining maximum uptake is slightly greater than mediastinal blood pool. The dominant remaining disease in the pelvis is an external iliac lymph node with a maximum SUV moderately greater than liver blood pool. Evaluation of the bones is more difficult today given the diffuse uptake likely associated with recent chemotherapy. I suspect the previously identified humeral lesion remains. Recommend continued attention to the bones on follow-up. [place] Category  4  -PET scan skull base to thigh on 01/05/2017 r after 6 cycles of R-CHOP and IT MTXx 4 revealing: IMPRESSION:1. Mixed findings on the PET-CT. There are definite areas of significant continued improvement but also some new areas of involvement and slightly progressive pelvic adenopathy as detailed above. 2. Near complete resolution of the right pleural effusion. 3. Much improved osseous disease.  Bx of pelvic LN consistent with refractory DLBCL  BM BX - shows extensive BM  Involvement by large cell lymphoma (02/09/2017)   3) Symptomatic anemia from BM involvement by lymphoma and chemotherapy. hgb upto to 8.2 from 7.6 s/p PRBC x 1  4) Severe thrombocytopenia PLT 16k BM involvement by lymphoma and chemotherapy. Upto 32k s/p Platelets x 1  5) Neutropenia -related to chemotherapy and BM involvement with lymphoma. Resolved ANC 600--->2000  6) Elevated uric acid - TLS uric acid 11.2--->8.8 . Ordered Rasburicase yesterday evening but did not receive it till this AM.  7) Acute renal failure -- related to po dehydration + TLS + stress from symptomatic anemia. Creatinine improved from 2.92 -->2.49 Plan: -I discussed all the blood work results with the patient. -rasburicase 6 mg IV ordered yesterday evening but received this AM -Daily CBC with differential, cmp and uric acid -switch IV NS to D5W with 2 amp of NAHCO3 @ 125ml/hr.. plz monitor for fluid overload -encouraged po fluid intake and ambulation -will transfuse 1 unit of PRBC given need for ongoing chemotherapy. -allopurinol dose increased to 100mg  po BID -will rpt Rasburicase tomorrow if needed based on uric acid levels. -no indication for further G-CSF (received neulasta 03/16/2017) -appreciate help from hospital medicine team. -will follow along. 8) left foot pain -- likely from her back issues. Given some local arch discomfort will get XRay left foot to r/o stress fracture.  DISPO-- discharge tomorrow or Monday --based on uric  acid and renal function.    Sullivan Lone MD [place] AAHIVMS [facility] Hematology/Oncology Physician [facility]  (Office):       [phone] (Work cell):  [phone] (Fax):           [phone]

## 2017-03-17 NOTE — Progress Notes (Signed)
Nurse spoke with on call NP.  She voiced that if patient is concerned and is refusing the  Elitek then the oncologist need to see her and explain why she may needs to take this medication.  Nurse will relay the information to patient.

## 2017-03-18 DIAGNOSIS — Z9221 Personal history of antineoplastic chemotherapy: Secondary | ICD-10-CM | POA: Diagnosis not present

## 2017-03-18 DIAGNOSIS — C8228 Follicular lymphoma grade III, unspecified, lymph nodes of multiple sites: Secondary | ICD-10-CM | POA: Diagnosis not present

## 2017-03-18 DIAGNOSIS — R197 Diarrhea, unspecified: Secondary | ICD-10-CM | POA: Diagnosis present

## 2017-03-18 DIAGNOSIS — D696 Thrombocytopenia, unspecified: Secondary | ICD-10-CM | POA: Diagnosis present

## 2017-03-18 DIAGNOSIS — Z87891 Personal history of nicotine dependence: Secondary | ICD-10-CM | POA: Diagnosis not present

## 2017-03-18 DIAGNOSIS — D630 Anemia in neoplastic disease: Secondary | ICD-10-CM | POA: Diagnosis not present

## 2017-03-18 DIAGNOSIS — D701 Agranulocytosis secondary to cancer chemotherapy: Secondary | ICD-10-CM | POA: Diagnosis present

## 2017-03-18 DIAGNOSIS — D61810 Antineoplastic chemotherapy induced pancytopenia: Secondary | ICD-10-CM | POA: Diagnosis present

## 2017-03-18 DIAGNOSIS — M5442 Lumbago with sciatica, left side: Secondary | ICD-10-CM | POA: Diagnosis present

## 2017-03-18 DIAGNOSIS — K219 Gastro-esophageal reflux disease without esophagitis: Secondary | ICD-10-CM | POA: Diagnosis present

## 2017-03-18 DIAGNOSIS — Z79899 Other long term (current) drug therapy: Secondary | ICD-10-CM | POA: Diagnosis not present

## 2017-03-18 DIAGNOSIS — C8330 Diffuse large B-cell lymphoma, unspecified site: Secondary | ICD-10-CM | POA: Diagnosis present

## 2017-03-18 DIAGNOSIS — I480 Paroxysmal atrial fibrillation: Secondary | ICD-10-CM | POA: Diagnosis present

## 2017-03-18 DIAGNOSIS — T451X5A Adverse effect of antineoplastic and immunosuppressive drugs, initial encounter: Secondary | ICD-10-CM | POA: Diagnosis present

## 2017-03-18 DIAGNOSIS — C8338 Diffuse large B-cell lymphoma, lymph nodes of multiple sites: Secondary | ICD-10-CM | POA: Diagnosis not present

## 2017-03-18 DIAGNOSIS — E860 Dehydration: Secondary | ICD-10-CM | POA: Diagnosis present

## 2017-03-18 DIAGNOSIS — M48061 Spinal stenosis, lumbar region without neurogenic claudication: Secondary | ICD-10-CM | POA: Diagnosis present

## 2017-03-18 DIAGNOSIS — R590 Localized enlarged lymph nodes: Secondary | ICD-10-CM | POA: Diagnosis present

## 2017-03-18 DIAGNOSIS — G4730 Sleep apnea, unspecified: Secondary | ICD-10-CM | POA: Diagnosis present

## 2017-03-18 DIAGNOSIS — D649 Anemia, unspecified: Secondary | ICD-10-CM | POA: Diagnosis not present

## 2017-03-18 DIAGNOSIS — G8929 Other chronic pain: Secondary | ICD-10-CM | POA: Diagnosis present

## 2017-03-18 DIAGNOSIS — N179 Acute kidney failure, unspecified: Secondary | ICD-10-CM | POA: Diagnosis present

## 2017-03-18 DIAGNOSIS — M7989 Other specified soft tissue disorders: Secondary | ICD-10-CM | POA: Diagnosis not present

## 2017-03-18 DIAGNOSIS — D61818 Other pancytopenia: Secondary | ICD-10-CM | POA: Diagnosis not present

## 2017-03-18 DIAGNOSIS — E883 Tumor lysis syndrome: Secondary | ICD-10-CM | POA: Diagnosis present

## 2017-03-18 DIAGNOSIS — M79672 Pain in left foot: Secondary | ICD-10-CM | POA: Diagnosis present

## 2017-03-18 DIAGNOSIS — Z6841 Body Mass Index (BMI) 40.0 and over, adult: Secondary | ICD-10-CM | POA: Diagnosis not present

## 2017-03-18 DIAGNOSIS — M4690 Unspecified inflammatory spondylopathy, site unspecified: Secondary | ICD-10-CM | POA: Diagnosis present

## 2017-03-18 DIAGNOSIS — Z7982 Long term (current) use of aspirin: Secondary | ICD-10-CM | POA: Diagnosis not present

## 2017-03-18 DIAGNOSIS — D6959 Other secondary thrombocytopenia: Secondary | ICD-10-CM | POA: Diagnosis not present

## 2017-03-18 DIAGNOSIS — D6481 Anemia due to antineoplastic chemotherapy: Secondary | ICD-10-CM | POA: Diagnosis present

## 2017-03-18 DIAGNOSIS — N170 Acute kidney failure with tubular necrosis: Secondary | ICD-10-CM | POA: Diagnosis not present

## 2017-03-18 LAB — COMPREHENSIVE METABOLIC PANEL
ALT: 41 U/L (ref 14–54)
AST: 60 U/L — AB (ref 15–41)
Albumin: 2.8 g/dL — ABNORMAL LOW (ref 3.5–5.0)
Alkaline Phosphatase: 51 U/L (ref 38–126)
Anion gap: 10 (ref 5–15)
BUN: 33 mg/dL — AB (ref 6–20)
CHLORIDE: 111 mmol/L (ref 101–111)
CO2: 22 mmol/L (ref 22–32)
Calcium: 8.2 mg/dL — ABNORMAL LOW (ref 8.9–10.3)
Creatinine, Ser: 2.15 mg/dL — ABNORMAL HIGH (ref 0.44–1.00)
GFR calc Af Amer: 26 mL/min — ABNORMAL LOW (ref >60)
GFR, EST NON AFRICAN AMERICAN: 23 mL/min — AB (ref >60)
Glucose, Bld: 106 mg/dL — ABNORMAL HIGH (ref 65–99)
POTASSIUM: 4 mmol/L (ref 3.5–5.1)
SODIUM: 143 mmol/L (ref 135–145)
Total Bilirubin: 0.5 mg/dL (ref 0.3–1.2)
Total Protein: 5.4 g/dL — ABNORMAL LOW (ref 6.5–8.1)

## 2017-03-18 LAB — TYPE AND SCREEN
ABO/RH(D): A POS
ANTIBODY SCREEN: NEGATIVE
UNIT DIVISION: 0
UNIT DIVISION: 0
Unit division: 0

## 2017-03-18 LAB — CBC WITH DIFFERENTIAL/PLATELET
Basophils Absolute: 0 10*3/uL (ref 0.0–0.1)
Basophils Relative: 0 %
EOS ABS: 0 10*3/uL (ref 0.0–0.7)
EOS PCT: 0 %
HCT: 27.5 % — ABNORMAL LOW (ref 36.0–46.0)
Hemoglobin: 8.9 g/dL — ABNORMAL LOW (ref 12.0–15.0)
LYMPHS ABS: 0.2 10*3/uL — AB (ref 0.7–4.0)
Lymphocytes Relative: 6 %
MCH: 28.2 pg (ref 26.0–34.0)
MCHC: 32.4 g/dL (ref 30.0–36.0)
MCV: 87 fL (ref 78.0–100.0)
MONO ABS: 0 10*3/uL — AB (ref 0.1–1.0)
Monocytes Relative: 1 %
Neutro Abs: 2.9 10*3/uL (ref 1.7–7.7)
Neutrophils Relative %: 93 %
PLATELETS: 23 10*3/uL — AB (ref 150–400)
RBC: 3.16 MIL/uL — AB (ref 3.87–5.11)
RDW: 16.4 % — AB (ref 11.5–15.5)
WBC: 3.1 10*3/uL — AB (ref 4.0–10.5)

## 2017-03-18 LAB — BPAM RBC
BLOOD PRODUCT EXPIRATION DATE: 201904012359
Blood Product Expiration Date: 201903312359
Blood Product Expiration Date: 201903312359
ISSUE DATE / TIME: 201903151609
ISSUE DATE / TIME: 201903161539
ISSUE DATE / TIME: 201903151212
UNIT TYPE AND RH: 6200
UNIT TYPE AND RH: 6200
Unit Type and Rh: 6200

## 2017-03-18 LAB — URIC ACID

## 2017-03-18 LAB — MAGNESIUM: Magnesium: 1.6 mg/dL — ABNORMAL LOW (ref 1.7–2.4)

## 2017-03-18 MED ORDER — STERILE WATER FOR INJECTION IJ SOLN
INTRAMUSCULAR | Status: AC
  Filled 2017-03-18: qty 10

## 2017-03-18 MED ORDER — ALTEPLASE 2 MG IJ SOLR
2.0000 mg | Freq: Once | INTRAMUSCULAR | Status: AC
  Administered 2017-03-18: 2 mg
  Filled 2017-03-18: qty 2

## 2017-03-18 MED ORDER — MAGNESIUM SULFATE 2 GM/50ML IV SOLN
2.0000 g | Freq: Once | INTRAVENOUS | Status: AC
  Administered 2017-03-18: 2 g via INTRAVENOUS
  Filled 2017-03-18: qty 50

## 2017-03-18 NOTE — Progress Notes (Signed)
@IPLOG @        PROGRESS NOTE                                                                                                                                                                                                             Patient Demographics:    Jo Collins, is a 67 y.o. female, DOB - 07-26-1950, XBM:[id number]  Admit date - 03/16/2017   Admitting Physician Hosie Poisson, MD  Outpatient Primary MD for the patient is Deland Pretty, MD  LOS - 1  No chief complaint on file.      Brief Narrative   Jo Collins is a 67 y.o. female with medical history significant of relapsed diffuse large B cell lymphoma, sciatica , GERD, anemia, chronic back pain,  Morbid obesity, last chemo on 3/8  Presented to cancer center for another cycle of chemo, but was found to be dehydrated, pancytopenic, and in acute renal failure, workup suggested that she had tumor lysis syndrome along with some diarrhea prior to coming to the hospital.   Subjective:   Patient in bed, appears comfortable, denies any headache, no fever, no chest pain or pressure, no shortness of breath , no abdominal pain. No focal weakness.    Assessment  & Plan :     1.  ARF caused by combination of tumor lysis syndrome, elevated uric acid along with diarrhea induced dehydration.  Treated with combination of IV fluids, allopurinol along with Elitek, Uric acid levels are improving, renal function is improving, currently symptom-free, if continues to improve likely discharge tomorrow.  2.  Relapsed large B-cell lymphoma.  Undergoing chemotherapy under the care of Dr. Irene Limbo, pancytopenia induced by chemotherapy, continue supportive care.  Last chemo on 03/09/2017.  She also has follow-up with [facility] for possible bone marrow transplant.  She is a currently received 2 units of packed RBCs per oncology, also received Neulasta in the office.  Counts are improving, monitor platelet counts closely.  3.  Pancytopenia.  Plan as in #2  above.  4.  Chronic back pain.  Due to severe spinal stenosis at L4-L5.  Supportive care.  5.  GERD -  stable no acute issues.    Diet : Diet regular Room service appropriate? Yes; Fluid consistency: Thin    Family Communication  :  none  Code Status :  Full  Disposition Plan  : Home when Renal function has stabilized  Consults  :  None  Procedures  :    DVT Prophylaxis  :    Heparin  Lab Results  Component Value Date   PLT 23 (LL) 03/18/2017    Inpatient Medications  Scheduled Meds: . allopurinol  100 mg Oral BID  . gabapentin  300 mg Oral Daily  . gabapentin  600 mg Oral QHS  . sterile water (preservative free)       Continuous Infusions: . magnesium sulfate 1 - 4 g bolus IVPB    .  sodium bicarbonate  infusion 1000 mL 75 mL/hr at 03/18/17 0741   PRN Meds:.heparin lock flush, heparin lock flush, morphine injection, sodium chloride flush, traMADol  Antibiotics  :    Anti-infectives (From admission, onward)   None         Objective:   Vitals:   03/17/17 1603 03/17/17 1926 03/17/17 2130 03/18/17 0534  BP: 111/67 126/70 114/61 98/70  Pulse: 67 68 69 64  Resp: 18 18 17 20   Temp: 98.5 F (36.9 C) 99.2 F (37.3 C) 99 F (37.2 C) 98.5 F (36.9 C)  TempSrc: Axillary Oral Oral Oral  SpO2:  100% 99% 99%  Weight:      Height:        Wt Readings from Last 3 Encounters:  03/17/17 115.7 kg (255 lb)  03/09/17 115.3 kg (254 lb 4.8 oz)  03/06/17 114.6 kg (252 lb 9.6 oz)     Intake/Output Summary (Last 24 hours) at 03/18/2017 1009 Last data filed at 03/18/2017 0600 Gross per 24 hour  Intake 1981.84 ml  Output -  Net 1981.84 ml     Physical Exam  Awake Alert, Oriented X 3, No new F.N deficits, Normal affect [place].AT,PERRAL Supple Neck,No JVD, No cervical lymphadenopathy appriciated.  Symmetrical Chest wall movement, Good air movement bilaterally, CTAB RRR,No Gallops, Rubs or new Murmurs, No Parasternal Heave +ve B.Sounds, Abd Soft, No tenderness,  No organomegaly appriciated, No rebound - guarding or rigidity. No Cyanosis, Clubbing or edema, No new Rash or bruise      Data Review:    CBC Recent Labs  Lab 03/16/17 0753 03/17/17 0458 03/18/17 0447  WBC 0.6* 2.2* 3.1*  HGB  --  8.2* 8.9*  HCT 24.6* 25.5* 27.5*  PLT 16* 31* 23*  MCV 85.4 84.4 87.0  MCH 26.4 27.2 28.2  MCHC 30.9* 32.2 32.4  RDW 17.7* 16.6* 16.4*  LYMPHSABS 0.1* 0.2* 0.2*  MONOABS 0.0* 0.0* 0.0*  EOSABS 0.0 0.0 0.0  BASOSABS 0.0 0.0 0.0    Chemistries  Recent Labs  Lab 03/16/17 0753 03/17/17 0458 03/18/17 0447  NA 147* 147* 143  K 4.7 3.9 4.0  CL 115* 114* 111  CO2 18* 21* 22  GLUCOSE 93 95 106*  BUN 62* 48* 33*  CREATININE 2.92* 2.49* 2.15*  CALCIUM 8.7 8.0* 8.2*  MG  --   --  1.6*  AST 71*  --  60*  ALT 54  --  41  ALKPHOS 67  --  51  BILITOT 0.5  --  0.5   ------------------------------------------------------------------------------------------------------------------ No results for input(s): CHOL, HDL, LDLCALC, TRIG, CHOLHDL, LDLDIRECT in the last 72 hours.  No results found for: HGBA1C ------------------------------------------------------------------------------------------------------------------ No results for input(s): TSH, T4TOTAL, T3FREE, THYROIDAB in the last 72 hours.  Invalid input(s): FREET3 ------------------------------------------------------------------------------------------------------------------ Recent Labs    03/16/17 0753  RETICCTPCT <0.4*    Coagulation profile No results for input(s): INR, PROTIME in the last 168 hours.  No results for input(s): DDIMER in the last 72 hours.  Cardiac Enzymes No results for input(s): CKMB, TROPONINI, MYOGLOBIN in the last 168 hours.  Invalid  input(s): CK ------------------------------------------------------------------------------------------------------------------    Component Value Date/Time   BNP 75.9 10/07/2016 2300    Micro Results No results found for  this or any previous visit (from the past 240 hour(s)).  Radiology Reports Dg Chest Port 1 View  Result Date: 03/17/2017 CLINICAL DATA:  SOB today EXAM: PORTABLE CHEST 1 VIEW COMPARISON:  CT 02/06/2017 FINDINGS: Port in the anterior chest wall with tip in distal SVC. Normal cardiac silhouette. There is RIGHT pleural effusion and atelectasis. Upper lungs clear. No pneumothorax. IMPRESSION: 1. Persistent RIGHT pleural effusion and RIGHT basilar scarring. Chronic findings. 2. LEFT lung clear Electronically Signed   By: Suzy Bouchard M.D.   On: 03/17/2017 07:37   Dg Foot Complete Left  Result Date: 03/17/2017 CLINICAL DATA:  67 year old female with left foot pain for 1 month. Plantar and lateral aspect pain with no known injury. EXAM: LEFT FOOT - COMPLETE 3+ VIEW COMPARISON:  None. FINDINGS: Degenerative spurring of the plantar and dorsal calcaneus. Dorsal degenerative spurring throughout the tarsal bones, including dystrophic calcification or spurring along the medial anterior talus. Accessory ossicle adjacent to the cuboid. The metatarsals are intact. There is mild joint space loss and degenerative subchondral sclerosis at the 1st MTP and 1st IP joints. There is chronic resorption of the head of the 5th proximal phalanx. The 5th middle and distal phalanges appear normal. No acute osseous abnormality identified. Generalized soft tissue swelling. No subcutaneous gas. IMPRESSION: Chronic and degenerative osseous changes throughout the left foot. No acute osseous abnormality identified. Generalized soft tissue swelling. Electronically Signed   By: Genevie Ann M.D.   On: 03/17/2017 15:40    Time Spent in minutes  30   Lala Lund M.D on 03/18/2017 at 10:09 AM  Between 7am to 7pm - Pager - [phone] ( page via [email or web address], text pages only, please mention full 10 digit call back number). After 7pm go to [email or web address] - password [email or web address]

## 2017-03-19 LAB — CBC WITH DIFFERENTIAL/PLATELET
BASOS ABS: 0 10*3/uL (ref 0.0–0.1)
BASOS PCT: 0 %
BASOS PCT: 0 %
Basophils Absolute: 0 10*3/uL (ref 0.0–0.1)
EOS PCT: 0 %
Eosinophils Absolute: 0 10*3/uL (ref 0.0–0.7)
Eosinophils Absolute: 0 10*3/uL (ref 0.0–0.7)
Eosinophils Relative: 0 %
HCT: 25.5 % — ABNORMAL LOW (ref 36.0–46.0)
HCT: 27 % — ABNORMAL LOW (ref 36.0–46.0)
HEMOGLOBIN: 8.2 g/dL — AB (ref 12.0–15.0)
Hemoglobin: 8.5 g/dL — ABNORMAL LOW (ref 12.0–15.0)
LYMPHS PCT: 19 %
LYMPHS PCT: 32 %
Lymphs Abs: 0.2 10*3/uL — ABNORMAL LOW (ref 0.7–4.0)
Lymphs Abs: 0.2 10*3/uL — ABNORMAL LOW (ref 0.7–4.0)
MCH: 27.4 pg (ref 26.0–34.0)
MCH: 27.8 pg (ref 26.0–34.0)
MCHC: 32.2 g/dL (ref 30.0–36.0)
MCHC: 31.5 g/dL (ref 30.0–36.0)
MCV: 85.3 fL (ref 78.0–100.0)
MCV: 88.2 fL (ref 78.0–100.0)
MONO ABS: 0 10*3/uL — AB (ref 0.1–1.0)
MONOS PCT: 2 %
MONOS PCT: 10 %
Monocytes Absolute: 0.1 10*3/uL (ref 0.1–1.0)
NEUTROS PCT: 79 %
NEUTROS PCT: 58 %
Neutro Abs: 0.7 10*3/uL — ABNORMAL LOW (ref 1.7–7.7)
Neutro Abs: 0.3 10*3/uL — ABNORMAL LOW (ref 1.7–7.7)
PLATELETS: 11 10*3/uL — AB (ref 150–400)
Platelets: 26 10*3/uL — CL (ref 150–400)
RBC: 2.99 MIL/uL — AB (ref 3.87–5.11)
RBC: 3.06 MIL/uL — ABNORMAL LOW (ref 3.87–5.11)
RDW: 16 % — ABNORMAL HIGH (ref 11.5–15.5)
RDW: 16.2 % — ABNORMAL HIGH (ref 11.5–15.5)
WBC: 0.9 10*3/uL — AB (ref 4.0–10.5)
WBC: 0.6 10*3/uL — CL (ref 4.0–10.5)

## 2017-03-19 LAB — HEMOGLOBIN AND HEMATOCRIT, BLOOD
HEMATOCRIT: 26.4 % — AB (ref 36.0–46.0)
HEMOGLOBIN: 8.6 g/dL — AB (ref 12.0–15.0)

## 2017-03-19 LAB — COMPREHENSIVE METABOLIC PANEL
ALK PHOS: 51 U/L (ref 38–126)
ALT: 38 U/L (ref 14–54)
AST: 55 U/L — AB (ref 15–41)
Albumin: 2.5 g/dL — ABNORMAL LOW (ref 3.5–5.0)
Anion gap: 10 (ref 5–15)
BUN: 25 mg/dL — AB (ref 6–20)
CALCIUM: 8 mg/dL — AB (ref 8.9–10.3)
CHLORIDE: 106 mmol/L (ref 101–111)
CO2: 26 mmol/L (ref 22–32)
CREATININE: 1.86 mg/dL — AB (ref 0.44–1.00)
GFR calc Af Amer: 31 mL/min — ABNORMAL LOW (ref >60)
GFR, EST NON AFRICAN AMERICAN: 27 mL/min — AB (ref >60)
Glucose, Bld: 88 mg/dL (ref 65–99)
Potassium: 3.4 mmol/L — ABNORMAL LOW (ref 3.5–5.1)
Sodium: 142 mmol/L (ref 135–145)
Total Bilirubin: 0.6 mg/dL (ref 0.3–1.2)
Total Protein: 4.9 g/dL — ABNORMAL LOW (ref 6.5–8.1)

## 2017-03-19 LAB — TSH: TSH: 1.474 u[IU]/mL (ref 0.350–4.500)

## 2017-03-19 LAB — URIC ACID: Uric Acid, Serum: <0.5 mg/dL — ABNORMAL LOW (ref 2.3–6.6)

## 2017-03-19 LAB — MAGNESIUM: MAGNESIUM: 1.6 mg/dL — AB (ref 1.7–2.4)

## 2017-03-19 MED ORDER — TRAZODONE HCL 50 MG PO TABS
50.0000 mg | ORAL_TABLET | Freq: Once | ORAL | Status: AC
  Administered 2017-03-19: 50 mg via ORAL
  Filled 2017-03-19: qty 1

## 2017-03-19 MED ORDER — LACTATED RINGERS IV SOLN
INTRAVENOUS | Status: DC
  Administered 2017-03-19 – 2017-03-20 (×3): via INTRAVENOUS

## 2017-03-19 MED ORDER — SODIUM CHLORIDE 0.9 % IV SOLN
Freq: Once | INTRAVENOUS | Status: AC
  Administered 2017-03-19: 12:00:00 via INTRAVENOUS

## 2017-03-19 MED ORDER — METOPROLOL TARTRATE 25 MG PO TABS
25.0000 mg | ORAL_TABLET | Freq: Two times a day (BID) | ORAL | Status: DC
  Administered 2017-03-19 – 2017-03-21 (×4): 25 mg via ORAL
  Filled 2017-03-19 (×5): qty 1

## 2017-03-19 MED ORDER — ALUM & MAG HYDROXIDE-SIMETH 200-200-20 MG/5ML PO SUSP
30.0000 mL | Freq: Four times a day (QID) | ORAL | Status: DC | PRN
  Administered 2017-03-19: 30 mL via ORAL
  Filled 2017-03-19: qty 30

## 2017-03-19 MED ORDER — DIGOXIN 0.25 MG/ML IJ SOLN
0.1250 mg | Freq: Four times a day (QID) | INTRAMUSCULAR | Status: DC
  Filled 2017-03-19: qty 0.5

## 2017-03-19 MED ORDER — METOPROLOL TARTRATE 5 MG/5ML IV SOLN
5.0000 mg | INTRAVENOUS | Status: DC | PRN
  Administered 2017-03-19: 5 mg via INTRAVENOUS
  Filled 2017-03-19: qty 5

## 2017-03-19 MED ORDER — PREMIER PROTEIN SHAKE
11.0000 [oz_av] | Freq: Two times a day (BID) | ORAL | Status: DC
  Administered 2017-03-19 – 2017-03-21 (×3): 11 [oz_av] via ORAL

## 2017-03-19 MED ORDER — DIPHENHYDRAMINE HCL 50 MG/ML IJ SOLN: 25.0000 mg | Freq: Four times a day (QID) | INTRAMUSCULAR | Status: DC | PRN

## 2017-03-19 MED ORDER — POTASSIUM CHLORIDE CRYS ER 20 MEQ PO TBCR
40.0000 meq | EXTENDED_RELEASE_TABLET | Freq: Once | ORAL | Status: AC
  Administered 2017-03-19: 40 meq via ORAL
  Filled 2017-03-19: qty 2

## 2017-03-19 MED ORDER — SODIUM CHLORIDE 0.9 % IV BOLUS (SEPSIS)
250.0000 mL | Freq: Once | INTRAVENOUS | Status: AC
  Administered 2017-03-19: 250 mL via INTRAVENOUS

## 2017-03-19 NOTE — Progress Notes (Addendum)
Patient's BP and HR stable after admin of IV Lopressor; with new c/o intermittent chest pain; patient states she believes it to be indigestion. MD notified; See New orders.

## 2017-03-19 NOTE — Progress Notes (Signed)
Wbc of 0.9 reported to NP per facility'sy's protocol. No new orders given.

## 2017-03-19 NOTE — Care Management Important Message (Signed)
Important Message  Patient Details  Name: Jo Collins MRN: [id number] Date of Birth: 1950-02-26   Medicare Important Message Given:  Yes    Kerin Salen 03/19/2017, 10:36 AMImportant Message  Patient Details  Name: Jo Collins MRN: [id number] Date of Birth: 06-27-1950   Medicare Important Message Given:  Yes    Kerin Salen 03/19/2017, 10:36 AM

## 2017-03-19 NOTE — Discharge Instructions (Signed)
[place] [facility] Stay Proper nutrition can help your body recover from illness and injury.   Foods and beverages high in protein, vitamins, and minerals help rebuild muscle loss, promote healing, & reduce fall risk.   In addition to eating healthy foods, a nutrition shake is an easy, delicious way to get the nutrition you need during and after your hospital stay  It is recommended that you continue to drink 2 bottles per day of:       Premier Protein or Protein powders(whey or plant based) for at least 1 month (30 days) after your hospital stay   Tips for adding a nutrition shake into your routine: As allowed, drink one with vitamins or medications instead of water or juice Enjoy one as a tasty mid-morning or afternoon snack Drink cold or make a milkshake out of it Drink one instead of milk with cereal or snacks Use as a coffee creamer   Available at the following grocery stores and pharmacies:           *  City [phone]

## 2017-03-19 NOTE — Progress Notes (Addendum)
@IPLOG @        PROGRESS NOTE                                                                                                                                                                                                             Patient Demographics:    Jo Collins, is a 67 y.o. female, DOB - [birth year], SJG:[id number]  Admit date - 03/16/2017   Admitting Physician Hosie Poisson, MD  Outpatient Primary MD for the patient is Deland Pretty, MD  LOS - 1  No chief complaint on file.      Brief Narrative   Jo Collins is a 67 y.o. female with medical history significant of relapsed diffuse large B cell lymphoma, sciatica , GERD, anemia, chronic back pain,  Morbid obesity, last chemo on 3/8  Presented to cancer center for another cycle of chemo, but was found to be dehydrated, pancytopenic, and in acute renal failure, workup suggested that she had tumor lysis syndrome along with some diarrhea prior to coming to the hospital.   Subjective:   Patient in bed, appears comfortable, denies any headache, no fever, no chest pain or pressure, no shortness of breath , no abdominal pain. No focal weakness.   Assessment  & Plan :     1.  ARF caused by combination of tumor lysis syndrome, elevated uric acid along with diarrhea induced dehydration.  She was treated with IV fluids, allopurinol and a single dose of Elitek, uric acid levels have now normalized, renal function has improved, she remains symptom-free, discussed her case with her oncologist Dr. Irene Limbo he will see the patient shortly.  Once her pancytopenia has improved she will be discharged home.  2.  Relapsed large B-cell lymphoma.  Undergoing chemotherapy under the care of Dr. Irene Limbo, pancytopenia induced by chemotherapy, continue supportive care.  Last chemo on 03/09/2017.  She also has follow-up with [facility] for possible bone marrow transplant.  She so far has received 2 units of packed RBCs, 1 unit of platelets per oncology, also received  Neulasta in the office.  Counts were improving however platelet count dropped again on 03/19/2017 along with neutropenia, she would be getting another unit of platelet on 03/19/2017, again case discussed with her oncologist on 03/19/2017.  3.  Pancytopenia.  Plan as in #2 above.  4.  Chronic back pain.  Due to severe spinal stenosis at L4-L5.  Supportive care.  5.  GERD -  stable no acute issues.  6.  History of paroxysmal A. fib.  History of  the same. Went into it on 03/19/2016, echocardiogram from 1 year ago reviewed with EF of 65%, will give her gentle normal saline bolus, IV Lopressor if needed load with IV digoxin as blood pressure is borderline.  No need for telemetry at this is not known problem, not on anticoagulation chronically.  Mali vas 2 score of at least 3.  Currently no question of anticoagulation due to severe pancytopenia.  Not on aspirin as platelet counts down to 11.     Diet : Diet regular Room service appropriate? Yes; Fluid consistency: Thin    Family Communication  :  none  Code Status :  Full  Disposition Plan  : Home when Renal function has stabilized  Consults  :  None  Procedures  :    DVT Prophylaxis  :    Heparin    Lab Results  Component Value Date   PLT 11 (LL) 03/19/2017    Inpatient Medications  Scheduled Meds: . allopurinol  100 mg Oral BID  . gabapentin  300 mg Oral Daily  . gabapentin  600 mg Oral QHS   Continuous Infusions: . sodium chloride    .  sodium bicarbonate  infusion 1000 mL 75 mL/hr at 03/18/17 2327   PRN Meds:.diphenhydrAMINE, heparin lock flush, heparin lock flush, morphine injection, sodium chloride flush, traMADol  Antibiotics  :    Anti-infectives (From admission, onward)   None         Objective:   Vitals:   03/18/17 0534 03/18/17 1503 03/18/17 2022 03/19/17 0553  BP: 98/70 106/64 (!) 142/90 126/66  Pulse: 64 68 77 72  Resp: 20 18 18 19   Temp: 98.5 F (36.9 C) 98.6 F (37 C) 97.7 F (36.5 C) 99.5 F (37.5  C)  TempSrc: Oral Oral Oral Oral  SpO2: 99% 100% 98% 95%  Weight:      Height:        Wt Readings from Last 3 Encounters:  03/17/17 115.7 kg (255 lb)  03/09/17 115.3 kg (254 lb 4.8 oz)  03/06/17 114.6 kg (252 lb 9.6 oz)     Intake/Output Summary (Last 24 hours) at 03/19/2017 1021 Last data filed at 03/19/2017 0553 Gross per 24 hour  Intake 1940 ml  Output -  Net 1940 ml     Physical Exam  Awake Alert, Oriented X 3, No new F.N deficits, Normal affect [place].AT,PERRAL Supple Neck,No JVD, No cervical lymphadenopathy appriciated.  Symmetrical Chest wall movement, Good air movement bilaterally, CTAB, R.subclavian Port-A-Cath site clean RRR,No Gallops, Rubs or new Murmurs, No Parasternal Heave +ve B.Sounds, Abd Soft, No tenderness, No organomegaly appriciated, No rebound - guarding or rigidity. No Cyanosis, Clubbing or edema, No new Rash or bruise    Data Review:    CBC Recent Labs  Lab 03/16/17 0753 03/17/17 0458 03/18/17 0447 03/19/17 0521  WBC 0.6* 2.2* 3.1* 0.9*  HGB  --  8.2* 8.9* 8.2*  HCT 24.6* 25.5* 27.5* 25.5*  PLT 16* 31* 23* 11*  MCV 85.4 84.4 87.0 85.3  MCH 26.4 27.2 28.2 27.4  MCHC 30.9* 32.2 32.4 32.2  RDW 17.7* 16.6* 16.4* 16.0*  LYMPHSABS 0.1* 0.2* 0.2* 0.2*  MONOABS 0.0* 0.0* 0.0* 0.0*  EOSABS 0.0 0.0 0.0 0.0  BASOSABS 0.0 0.0 0.0 0.0    Chemistries  Recent Labs  Lab 03/16/17 0753 03/17/17 0458 03/18/17 0447 03/19/17 0521  NA 147* 147* 143 142  K 4.7 3.9 4.0 3.4*  CL 115* 114* 111 106  CO2 18* 21* 22 26  GLUCOSE 93 95 106* 88  BUN 62* 48* 33* 25*  CREATININE 2.92* 2.49* 2.15* 1.86*  CALCIUM 8.7 8.0* 8.2* 8.0*  MG  --   --  1.6* 1.6*  AST 71*  --  60* 55*  ALT 54  --  41 38  ALKPHOS 67  --  51 51  BILITOT 0.5  --  0.5 0.6   ------------------------------------------------------------------------------------------------------------------ No results for input(s): CHOL, HDL, LDLCALC, TRIG, CHOLHDL, LDLDIRECT in the last 72 hours.  No  results found for: HGBA1C ------------------------------------------------------------------------------------------------------------------ No results for input(s): TSH, T4TOTAL, T3FREE, THYROIDAB in the last 72 hours.  Invalid input(s): FREET3 ------------------------------------------------------------------------------------------------------------------ No results for input(s): VITAMINB12, FOLATE, FERRITIN, TIBC, IRON, RETICCTPCT in the last 72 hours.  Coagulation profile No results for input(s): INR, PROTIME in the last 168 hours.  No results for input(s): DDIMER in the last 72 hours.  Cardiac Enzymes No results for input(s): CKMB, TROPONINI, MYOGLOBIN in the last 168 hours.  Invalid input(s): CK ------------------------------------------------------------------------------------------------------------------    Component Value Date/Time   BNP 75.9 10/07/2016 2300    Micro Results No results found for this or any previous visit (from the past 240 hour(s)).  Radiology Reports Dg Chest Port 1 View  Result Date: 03/17/2017 CLINICAL DATA:  SOB today EXAM: PORTABLE CHEST 1 VIEW COMPARISON:  CT 02/06/2017 FINDINGS: Port in the anterior chest wall with tip in distal SVC. Normal cardiac silhouette. There is RIGHT pleural effusion and atelectasis. Upper lungs clear. No pneumothorax. IMPRESSION: 1. Persistent RIGHT pleural effusion and RIGHT basilar scarring. Chronic findings. 2. LEFT lung clear Electronically Signed   By: Suzy Bouchard M.D.   On: 03/17/2017 07:37   Dg Foot Complete Left  Result Date: 03/17/2017 CLINICAL DATA:  67 year old female with left foot pain for 1 month. Plantar and lateral aspect pain with no known injury. EXAM: LEFT FOOT - COMPLETE 3+ VIEW COMPARISON:  None. FINDINGS: Degenerative spurring of the plantar and dorsal calcaneus. Dorsal degenerative spurring throughout the tarsal bones, including dystrophic calcification or spurring along the medial anterior  talus. Accessory ossicle adjacent to the cuboid. The metatarsals are intact. There is mild joint space loss and degenerative subchondral sclerosis at the 1st MTP and 1st IP joints. There is chronic resorption of the head of the 5th proximal phalanx. The 5th middle and distal phalanges appear normal. No acute osseous abnormality identified. Generalized soft tissue swelling. No subcutaneous gas. IMPRESSION: Chronic and degenerative osseous changes throughout the left foot. No acute osseous abnormality identified. Generalized soft tissue swelling. Electronically Signed   By: Genevie Ann M.D.   On: 03/17/2017 15:40    Time Spent in minutes  30   Lala Lund M.D on 03/19/2017 at 10:21 AM  Between 7am to 7pm - Pager - [phone] ( page via [email or web address], text pages only, please mention full 10 digit call back number). After 7pm go to [email or web address] - password [facility]

## 2017-03-19 NOTE — Progress Notes (Signed)
Initial Nutrition Assessment  DOCUMENTATION CODES:   Morbid obesity  INTERVENTION:   -Provide Premier Protein BID, each supplement provides 160 kcal and 30 grams of protein.  -Discussed strategies to help with taste alterations  NUTRITION DIAGNOSIS:   Increased nutrient needs related to cancer and cancer related treatments as evidenced by estimated needs.  GOAL:   Patient will meet greater than or equal to 90% of their needs  MONITOR:   PO intake, Supplement acceptance, Labs, Weight trends, I & O's  REASON FOR ASSESSMENT:   Malnutrition Screening Tool    ASSESSMENT:    67 y.o. female with medical history significant of relapsed diffuse large B cell lymphoma, sciatica , GERD, anemia, chronic back pain,  Morbid obesity, last chemo on 3/8  Presented to cancer center for another cycle of chemo, but was found to be dehydrated, pancytopenic, and in acute renal failure, workup suggested that she had tumor lysis syndrome along with some diarrhea prior to coming to the hospital.  Pt in room with sister at bedside. Pt reports she is eating well despite having taste changes. States fruits and tart foods taste the best to her. Reviewed strategies to help with the taste changes.  Pt currently consuming 75% of meals. Pt is interested in trying a protein supplement, will order Premier Protein supplement for pt to try given weight loss.   Per chart review, pt has lost 34 lb since 09/01/16 (12% wt loss x 7 months, significant for time frame).   Medications: Lactated Ringers infusion at 75 ml/hr  Labs reviewed: Low K, Mg  GFR: 31  NUTRITION - FOCUSED PHYSICAL EXAM:  Nutrition focused physical exam shows no sign of depletion of muscle mass or body fat.  Diet Order:  Diet regular Room service appropriate? Yes; Fluid consistency: Thin  EDUCATION NEEDS:   Education needs have been addressed  Skin:  Skin Assessment: Reviewed RN Assessment  Last BM:  3/17  Height:   Ht Readings from  Last 1 Encounters:  03/17/17 5\' 1"  (1.549 m)    Weight:   Wt Readings from Last 1 Encounters:  03/17/17 255 lb (115.7 kg)    Ideal Body Weight:  47.7 kg  BMI:  Body mass index is 48.18 kg/m.  Estimated Nutritional Needs:   Kcal:  1950-2150  Protein:  70-80g  Fluid:  2L/day  Clayton Bibles, MS, RD, LDN [place] Dietitian Pager: [phone] After Hours Pager: [phone]

## 2017-03-19 NOTE — Progress Notes (Signed)
Patient's HR 120s-160s; irregular rhythm; Patient states she has hx of a.fib;  MD notified; RN to administer IV Lopressor and 250 ml NS Bolus, monitor and update MD in 30 minutes.  If patient requires IV digoxin, she will need to be transferred to a telemetry floor

## 2017-03-20 LAB — BPAM PLATELET PHERESIS
Blood Product Expiration Date: 201903192359
ISSUE DATE / TIME: 201903181133
Unit Type and Rh: 6200

## 2017-03-20 LAB — CBC WITH DIFFERENTIAL/PLATELET
BASOS ABS: 0 10*3/uL (ref 0.0–0.1)
BASOS PCT: 0 %
Eosinophils Absolute: 0 10*3/uL (ref 0.0–0.7)
Eosinophils Relative: 0 %
HEMATOCRIT: 26.1 % — AB (ref 36.0–46.0)
HEMOGLOBIN: 8.4 g/dL — AB (ref 12.0–15.0)
LYMPHS PCT: 41 %
Lymphs Abs: 0.1 10*3/uL (ref 0.7–4.0)
MCH: 28.6 pg (ref 26.0–34.0)
MCHC: 32.2 g/dL (ref 30.0–36.0)
MCV: 88.8 fL (ref 78.0–100.0)
Monocytes Absolute: 0.1 10*3/uL (ref 0.1–1.0)
Monocytes Relative: 15 %
NEUTROS ABS: 0.2 10*3/uL (ref 1.7–7.7)
NEUTROS PCT: 44 %
Platelets: 23 10*3/uL — CL (ref 150–400)
RBC: 2.94 MIL/uL — ABNORMAL LOW (ref 3.87–5.11)
RDW: 16.3 % — ABNORMAL HIGH (ref 11.5–15.5)
WBC: 0.3 10*3/uL — CL (ref 4.0–10.5)

## 2017-03-20 LAB — COMPREHENSIVE METABOLIC PANEL
ALBUMIN: 2.5 g/dL — AB (ref 3.5–5.0)
ALK PHOS: 49 U/L (ref 38–126)
ALT: 34 U/L (ref 14–54)
ANION GAP: 10 (ref 5–15)
AST: 50 U/L — AB (ref 15–41)
BILIRUBIN TOTAL: 0.6 mg/dL (ref 0.3–1.2)
BUN: 15 mg/dL (ref 6–20)
CALCIUM: 8 mg/dL — AB (ref 8.9–10.3)
CO2: 27 mmol/L (ref 22–32)
Chloride: 106 mmol/L (ref 101–111)
Creatinine, Ser: 1.69 mg/dL — ABNORMAL HIGH (ref 0.44–1.00)
GFR calc Af Amer: 35 mL/min — ABNORMAL LOW (ref >60)
GFR calc non Af Amer: 30 mL/min — ABNORMAL LOW (ref >60)
GLUCOSE: 79 mg/dL (ref 65–99)
POTASSIUM: 3.3 mmol/L — AB (ref 3.5–5.1)
SODIUM: 143 mmol/L (ref 135–145)
TOTAL PROTEIN: 4.9 g/dL — AB (ref 6.5–8.1)

## 2017-03-20 LAB — PREPARE PLATELET PHERESIS: Unit division: 0

## 2017-03-20 LAB — URIC ACID: URIC ACID, SERUM: 0.9 mg/dL — AB (ref 2.3–6.6)

## 2017-03-20 MED ORDER — LACTATED RINGERS IV SOLN
INTRAVENOUS | Status: AC
  Administered 2017-03-20 (×2): via INTRAVENOUS

## 2017-03-20 MED ORDER — POTASSIUM CHLORIDE CRYS ER 20 MEQ PO TBCR
40.0000 meq | EXTENDED_RELEASE_TABLET | Freq: Once | ORAL | Status: AC
  Administered 2017-03-20: 40 meq via ORAL
  Filled 2017-03-20: qty 2

## 2017-03-20 MED ORDER — MAGNESIUM SULFATE IN D5W 1-5 GM/100ML-% IV SOLN
1.0000 g | Freq: Once | INTRAVENOUS | Status: AC
  Administered 2017-03-20: 1 g via INTRAVENOUS
  Filled 2017-03-20: qty 100

## 2017-03-20 NOTE — Progress Notes (Signed)
Jo Collins   HEMATOLOGY/ONCOLOGY INPATIENT PROGRESS NOTE  Date of Service: 03/20/2017  Inpatient Attending: .Thurnell Lose, MD   SUBJECTIVE  Patient seen this afternoon. Notes feeling a little better and ambulates 2 times with a walker. No fevers. Still neutropenic and significantly thrombocytopenic from ctx- likely at nadir. Hgb stable. Creatinine improving. Notes good urine output and good fluid intake. Jo Collins to go home. Discussed with hospitalist -possible discharge tomorrow if counts stable.  OBJECTIVE: Appears more alert today.  PHYSICAL EXAMINATION: . Vitals:   03/20/17 1030 03/20/17 1042 03/20/17 2138 03/20/17 2216  BP: 96/62 108/67 (!) 107/59 139/65  Pulse:  75 76 79  Resp:   17   Temp:   98.6 F (37 C)   TempSrc:   Oral   SpO2:   98%   Weight:      Height:       Filed Weights   03/17/17 0815  Weight: 255 lb (115.7 kg)   .Body mass index is 48.18 kg/m.  Jo Collins GENERAL:alert, in no acute distress and comfortable SKIN: no acute rashes, no significant lesions EYES: conjunctival pallor + , sclera anicteric OROPHARYNX: MMM, no exudates, no oropharyngeal erythema or ulceration NECK: supple, no JVD LYMPH:  no palpable lymphadenopathy in the cervical, axillary or inguinal regions LUNGS: clear to auscultation b/l with normal respiratory effort HEART: regular rate & rhythm ABDOMEN:  normoactive bowel sounds , non tender, not distended. Extremity:1+ pedal edema, left foot pain PSYCH: alert & oriented x 3 with fluent speech NEURO: no focal motor/sensory deficits  MEDICAL HISTORY:  Past Medical History:  Diagnosis Date  . Abdominal pain, RUQ   . Abnormal EKG 08/07/2016  . Acute midline thoracic back pain   . Acute respiratory distress 09/25/2016  . Anemia 08/07/2016  . Chronic low back pain 09/25/2016  . Cough   . DLBCL (diffuse large B cell lymphoma) ([place]) 08/17/2016  . GERD (gastroesophageal reflux disease)   . History of chemotherapy   . Hypokalemia 09/25/2016  .  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 Lymphadenopathy   . Morbid obesity with BMI of 50.0-59.9, adult ([place])   . Neutrophilic leukocytosis 52/07/7822  . NHL (non-Hodgkin's lymphoma) ([place])   . Pleural effusion on right 08/07/2016  . Recurrent pleural effusion on right 10/08/2016  . Recurrent right pleural effusion 09/25/2016  . S/P thoracentesis   . Shortness of breath 08/07/2016  . SOB (shortness of breath) 09/26/2016    SURGICAL HISTORY: Past Surgical History:  Procedure Laterality Date  . ABDOMINAL HYSTERECTOMY    . IR CV LINE INJECTION  10/26/2016  . IR FLUORO GUIDE PORT INSERTION RIGHT  08/18/2016  . IR FLUORO GUIDE PORT INSERTION RIGHT  11/03/2016  . IR REMOVAL TUN CV CATH W/O FL  11/03/2016  . IR THORACENTESIS ASP PLEURAL SPACE W/IMG GUIDE  08/07/2016  . IR THORACENTESIS ASP PLEURAL SPACE W/IMG GUIDE  08/14/2016  . IR US GUIDE VASC ACCESS RIGHT  08/18/2016  . IR US GUIDE VASC ACCESS RIGHT  11/03/2016    SOCIAL HISTORY: Social History   Socioeconomic History  . Marital status: Married    Spouse name: Not on file  . Number of children: Not on file  . Years of education: Not on file  . Highest education level: Not on file  Social Needs  . Financial resource strain: Not hard at all  . Food insecurity - worry: Never true  . Food insecurity - inability: Never true  . Transportation needs - medical: No  . Transportation needs -  non-medical: No  Occupational History  . Not on file  Tobacco Use  . Smoking status: Former Smoker    Packs/day: 0.50    Years: 47.00    Pack years: 23.50    Types: Cigarettes    Last attempt to quit: 07/28/2016    Years since quitting: 0.6  . Smokeless tobacco: Never Used  Substance and Sexual Activity  . Alcohol use: No  . Drug use: No  . Sexual activity: Not on file  Other Topics Concern  . Not on file  Social History Narrative  . Not on file    FAMILY HISTORY: Family History  Problem Relation Age of Onset  . COPD Mother   . Diabetes Mellitus II  Sister   . Diabetes Mellitus II Brother   . Diabetes Mellitus II Sister   . Stroke Sister   . Hypertension Sister   . Diabetes Mellitus II Sister     ALLERGIES:  has No Known Allergies.  MEDICATIONS:  Scheduled Meds: . allopurinol  100 mg Oral BID  . gabapentin  300 mg Oral Daily  . gabapentin  600 mg Oral QHS  . metoprolol tartrate  25 mg Oral BID  . protein supplement shake  11 oz Oral BID BM   Continuous Infusions: . lactated ringers 50 mL/hr at 03/20/17 2224   PRN Meds:.alum & mag hydroxide-simeth, diphenhydrAMINE, heparin lock flush, heparin lock flush, metoprolol tartrate, morphine injection, traMADol  REVIEW OF SYSTEMS:    10 Point review of Systems was done is negative except as noted above.   LABORATORY DATA:  I have reviewed the data as listed  .Jo Collins CBC Latest Ref Rng & Units 03/20/2017 03/19/2017 03/19/2017  WBC 4.0 - 10.5 K/uL 0.3(LL) 0.6(LL) -  Hemoglobin 12.0 - 15.0 g/dL 8.4(L) 8.5(L) 8.6(L)  Hematocrit 36.0 - 46.0 % 26.1(L) 27.0(L) 26.4(L)  Platelets 150 - 400 K/uL 23(LL) 26(LL) -  ANC 200  . CMP Latest Ref Rng & Units 03/20/2017 03/19/2017 03/18/2017  Glucose 65 - 99 mg/dL 79 88 106(H)  BUN 6 - 20 mg/dL 15 25(H) 33(H)  Creatinine 0.44 - 1.00 mg/dL 1.69(H) 1.86(H) 2.15(H)  Sodium 135 - 145 mmol/L 143 142 143  Potassium 3.5 - 5.1 mmol/L 3.3(L) 3.4(L) 4.0  Chloride 101 - 111 mmol/L 106 106 111  CO2 22 - 32 mmol/L 27 26 22   Calcium 8.9 - 10.3 mg/dL 8.0(L) 8.0(L) 8.2(L)  Total Protein 6.5 - 8.1 g/dL 4.9(L) 4.9(L) 5.4(L)  Total Bilirubin 0.3 - 1.2 mg/dL 0.6 0.6 0.5  Alkaline Phos 38 - 126 U/L 49 51 51  AST 15 - 41 U/L 50(H) 55(H) 60(H)  ALT 14 - 54 U/L 34 38 41    . Lab Results  Component Value Date   LDH 1,220 (H) 03/17/2017   Component     Latest Ref Rng & Units 03/16/2017 03/17/2017 03/18/2017 03/19/2017  Uric Acid, Serum     2.3 - 6.6 mg/dL 11.3 (H) 8.8 (H) <0.5 (L) <0.5 (L)   Component     Latest Ref Rng & Units 03/20/2017  Uric Acid, Serum      2.3 - 6.6 mg/dL 0.9 (L)       RADIOGRAPHIC STUDIES: I have personally reviewed the radiological images as listed and agreed with the findings in the report. Dg Chest Port 1 View  Result Date: 03/17/2017 CLINICAL DATA:  SOB today EXAM: PORTABLE CHEST 1 VIEW COMPARISON:  CT 02/06/2017 FINDINGS: Port in the anterior chest wall with tip in distal SVC. Normal  cardiac silhouette. There is RIGHT pleural effusion and atelectasis. Upper lungs clear. No pneumothorax. IMPRESSION: 1. Persistent RIGHT pleural effusion and RIGHT basilar scarring. Chronic findings. 2. LEFT lung clear Electronically Signed   By: Suzy Bouchard M.D.   On: 03/17/2017 07:37   Dg Foot Complete Left  Result Date: 03/17/2017 CLINICAL DATA:  67 year old female with left foot pain for 1 month. Plantar and lateral aspect pain with no known injury. EXAM: LEFT FOOT - COMPLETE 3+ VIEW COMPARISON:  None. FINDINGS: Degenerative spurring of the plantar and dorsal calcaneus. Dorsal degenerative spurring throughout the tarsal bones, including dystrophic calcification or spurring along the medial anterior talus. Accessory ossicle adjacent to the cuboid. The metatarsals are intact. There is mild joint space loss and degenerative subchondral sclerosis at the 1st MTP and 1st IP joints. There is chronic resorption of the head of the 5th proximal phalanx. The 5th middle and distal phalanges appear normal. No acute osseous abnormality identified. Generalized soft tissue swelling. No subcutaneous gas. IMPRESSION: Chronic and degenerative osseous changes throughout the left foot. No acute osseous abnormality identified. Generalized soft tissue swelling. Electronically Signed   By: Genevie Ann M.D.   On: 03/17/2017 15:40    ASSESSMENT & PLAN:    67 y.o. 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  -PET scan skull base to thigh on 10/21/2016 with results revealing: IMPRESSION:1. There has been significant interval improvement in the chest, abdomen, and pelvis in the interval. The dominant mass in the chest is much smaller and less FDG avid in the interval. However, the remaining maximum uptake is slightly greater than mediastinal blood pool. The dominant remaining disease in the pelvis is an external iliac lymph node with a maximum SUV moderately greater than liver blood pool. Evaluation of the bones is more difficult today given the diffuse uptake likely associated with recent chemotherapy. I suspect the previously identified humeral lesion remains. Recommend continued attention to the bones on follow-up. [place] Category 4  -PET scan skull base to thigh on 01/05/2017 r after 6 cycles of R-CHOP and IT MTXx 4 revealing: IMPRESSION:1. Mixed findings on the PET-CT. There are definite areas of significant continued improvement but also some new areas of involvement and slightly progressive pelvic adenopathy as detailed above. 2. Near complete resolution of the right pleural effusion. 3. Much improved osseous disease.  Bx of pelvic LN consistent with refractory DLBCL  BM BX - shows extensive BM  Involvement by large cell lymphoma (02/09/2017)   3) Symptomatic anemia from BM involvement by lymphoma and chemotherapy. hgb upto to 8.2 from 7.6 s/p PRBC x 1. HGB 8.4  4) Severe thrombocytopenia PLT 23 k BM involvement by lymphoma and chemotherapy.   5) Neutropenia -related to chemotherapy and BM involvement with lymphoma.  Still at nadir from chemotherapy. ANC 200. Has received  Neulasta.  6) Elevated uric acid - TLS uric acid 11.2--->8.8-->0.5-->0.9 .   7) Acute renal failure -- related to po dehydration + TLS + stress from symptomatic anemia. Creatinine improved from 2.92 -->2.49--> 1.69 Plan: -labs reviewed -hyperuricemia resolved, creatinine improving -no indication for  additional Rasburicase at this time. --encouraged po fluid intake and ambulation -blood counts at nadir - hope will start to bounce back soon. -neutropenic precautions. If febrile will need broad spectrum abx coverage. -transfuse PRBC for hgb<8 -transfuse platelets prn for Platelets < 10k or if bleeding -continue allopurinol  -no indication for further G-CSF (received neulasta 03/16/2017) -appreciate help from hospital medicine team.  8) left foot pain -- likely from her back issues. And X ray left foot also show Chronic and degenerative osseous changes throughout the left foot. No acute osseous abnormality identified.    Sullivan Lone MD [place] [facility]IVMS [facility] Hematology/Oncology Physician [facility]  (Office):       [phone] (Work cell):  [phone] (Fax):           [phone]

## 2017-03-20 NOTE — Progress Notes (Signed)
@IPLOG @        PROGRESS NOTE                                                                                                                                                                                                             Patient Demographics:    Jo Collins, is a 67 y.o. female, DOB - 04-Jul-1950, LOV:[id number]  Admit date - 03/16/2017   Admitting Physician Hosie Poisson, MD  Outpatient Primary MD for the patient is Deland Pretty, MD  LOS - 2  No chief complaint on file.      Brief Narrative   Jo Collins is a 67 y.o. female with medical history significant of relapsed diffuse large B cell lymphoma, sciatica , GERD, anemia, chronic back pain,  Morbid obesity, last chemo on 3/8  Presented to cancer center for another cycle of chemo, but was found to be dehydrated, pancytopenic, and in acute renal failure, workup suggested that she had tumor lysis syndrome along with some diarrhea prior to coming to the hospital.   Subjective:   Patient in bed, appears comfortable, denies any headache, no fever, no chest pain or pressure, no shortness of breath , no abdominal pain. No focal weakness.    Assessment  & Plan :     1.  ARF caused by combination of tumor lysis syndrome, elevated uric acid along with diarrhea induced dehydration.  She was treated with IV fluids, allopurinol and a single dose of Elitek, uric acid levels have now normalized, renal function has improved, she remains symptom-free, discussed her case with her oncologist Dr. Irene Limbo on 03/19/2017 he promised to follow the patient daily.  Once her pancytopenia has improved she will be discharged home.  If renal function has improved stop IV fluids on 03/21/2017.  2.  Relapsed large B-cell lymphoma.  Undergoing chemotherapy under the care of Dr. Irene Limbo, with severe pancytopenia induced by chemotherapy, continue supportive care.  Last chemo on 03/09/2017.  She also has follow-up with [facility] for possible bone marrow transplant.  So  far she has received 2 units of packed RBC and 2 units of platelets this admission, also received Neulasta several days ago in the office.  Case discussed with her oncologist Dr. Irene Limbo  by me personally on 03/19/2017, requested him to follow on a daily basis, he is monitoring her levels.  Currently CBC has stabilized but counts are still not going up.  Repeat CBC again in the morning.   3.  Pancytopenia.  As in #2 above.  4.  Chronic back pain.  Due to severe spinal stenosis at L4-L5.  Supportive care.  5.  GERD -  stable no acute issues.  6.  History of paroxysmal A. fib.  History of the same. Went into it on 03/19/2016, echocardiogram from 1 year ago reviewed with EF of 65%, stable TSH.  Mali vas 2 score of at least 2.  She was not on anticoagulation but aspirin only, currently not on antiplatelet medications due to pancytopenia, responded well to single dose of Lopressor, currently in sinus, blood pressure too low for long-term beta-blocker.  If rate becomes an issue will try low-dose digoxin.    Diet : Diet regular Room service appropriate? Yes; Fluid consistency: Thin    Family Communication  :  none  Code Status :  Full  Disposition Plan  : Home when Renal function has stabilized  Consults  :  None  Procedures  :    DVT Prophylaxis  :    Heparin    Lab Results  Component Value Date   PLT 23 (LL) 03/20/2017    Inpatient Medications  Scheduled Meds: . allopurinol  100 mg Oral BID  . gabapentin  300 mg Oral Daily  . gabapentin  600 mg Oral QHS  . metoprolol tartrate  25 mg Oral BID  . protein supplement shake  11 oz Oral BID BM   Continuous Infusions: . lactated ringers 50 mL/hr at 03/20/17 0804   PRN Meds:.alum & mag hydroxide-simeth, diphenhydrAMINE, heparin lock flush, heparin lock flush, metoprolol tartrate, morphine injection, traMADol  Antibiotics  :    Anti-infectives (From admission, onward)   None         Objective:   Vitals:   03/20/17 0955 03/20/17  1011 03/20/17 1030 03/20/17 1042  BP:  99/61 96/62 108/67  Pulse: 79   75  Resp: 19     Temp: 99.6 F (37.6 C)     TempSrc: Oral     SpO2: 100%     Weight:      Height:        Wt Readings from Last 3 Encounters:  03/17/17 115.7 kg (255 lb)  03/09/17 115.3 kg (254 lb 4.8 oz)  03/06/17 114.6 kg (252 lb 9.6 oz)     Intake/Output Summary (Last 24 hours) at 03/20/2017 1121 Last data filed at 03/20/2017 0912 Gross per 24 hour  Intake 1431 ml  Output -  Net 1431 ml     Physical Exam  Awake Alert, Oriented X 3, No new F.N deficits, Normal affect [place].AT,PERRAL Supple Neck,No JVD, No cervical lymphadenopathy appriciated.  Symmetrical Chest wall movement, Good air movement bilaterally, CTAB, right subclavian port site stable RRR,No Gallops, Rubs or new Murmurs, No Parasternal Heave +ve B.Sounds, Abd Soft, No tenderness, No organomegaly appriciated, No rebound - guarding or rigidity. No Cyanosis, Clubbing or edema, No new Rash or bruise     Data Review:    CBC Recent Labs  Lab 03/17/17 0458 03/18/17 0447 03/19/17 0521 03/19/17 1714 03/20/17 0500  WBC 2.2* 3.1* 0.9* 0.6* 0.3*  HGB 8.2* 8.9* 8.2* 8.5*  8.6* 8.4*  HCT 25.5* 27.5* 25.5* 27.0*  26.4* 26.1*  PLT 31* 23* 11* 26* 23*  MCV 84.4 87.0 85.3 88.2 88.8  MCH 27.2 28.2 27.4 27.8 28.6  MCHC 32.2 32.4 32.2 31.5 32.2  RDW 16.6* 16.4* 16.0* 16.2* 16.3*  LYMPHSABS 0.2* 0.2* 0.2* 0.2* 0.1  MONOABS 0.0* 0.0* 0.0* 0.1 0.1  EOSABS 0.0 0.0 0.0 0.0 0.0  BASOSABS 0.0  0.0 0.0 0.0 0.0    Chemistries  Recent Labs  Lab 03/16/17 0753 03/17/17 0458 03/18/17 0447 03/19/17 0521 03/20/17 0500  NA 147* 147* 143 142 143  K 4.7 3.9 4.0 3.4* 3.3*  CL 115* 114* 111 106 106  CO2 18* 21* 22 26 27   GLUCOSE 93 95 106* 88 79  BUN 62* 48* 33* 25* 15  CREATININE 2.92* 2.49* 2.15* 1.86* 1.69*  CALCIUM 8.7 8.0* 8.2* 8.0* 8.0*  MG  --   --  1.6* 1.6*  --   AST 71*  --  60* 55* 50*  ALT 54  --  41 38 34  ALKPHOS 67  --  51 51 49   BILITOT 0.5  --  0.5 0.6 0.6   ------------------------------------------------------------------------------------------------------------------ No results for input(s): CHOL, HDL, LDLCALC, TRIG, CHOLHDL, LDLDIRECT in the last 72 hours.  No results found for: HGBA1C ------------------------------------------------------------------------------------------------------------------ Recent Labs    03/19/17 1714  TSH 1.474   ------------------------------------------------------------------------------------------------------------------ No results for input(s): VITAMINB12, FOLATE, FERRITIN, TIBC, IRON, RETICCTPCT in the last 72 hours.  Coagulation profile No results for input(s): INR, PROTIME in the last 168 hours.  No results for input(s): DDIMER in the last 72 hours.  Cardiac Enzymes No results for input(s): CKMB, TROPONINI, MYOGLOBIN in the last 168 hours.  Invalid input(s): CK ------------------------------------------------------------------------------------------------------------------    Component Value Date/Time   BNP 75.9 10/07/2016 2300    Micro Results No results found for this or any previous visit (from the past 240 hour(s)).  Radiology Reports Dg Chest Port 1 View  Result Date: 03/17/2017 CLINICAL DATA:  SOB today EXAM: PORTABLE CHEST 1 VIEW COMPARISON:  CT 02/06/2017 FINDINGS: Port in the anterior chest wall with tip in distal SVC. Normal cardiac silhouette. There is RIGHT pleural effusion and atelectasis. Upper lungs clear. No pneumothorax. IMPRESSION: 1. Persistent RIGHT pleural effusion and RIGHT basilar scarring. Chronic findings. 2. LEFT lung clear Electronically Signed   By: Suzy Bouchard M.D.   On: 03/17/2017 07:37   Dg Foot Complete Left  Result Date: 03/17/2017 CLINICAL DATA:  67 year old female with left foot pain for 1 month. Plantar and lateral aspect pain with no known injury. EXAM: LEFT FOOT - COMPLETE 3+ VIEW COMPARISON:  None. FINDINGS:  Degenerative spurring of the plantar and dorsal calcaneus. Dorsal degenerative spurring throughout the tarsal bones, including dystrophic calcification or spurring along the medial anterior talus. Accessory ossicle adjacent to the cuboid. The metatarsals are intact. There is mild joint space loss and degenerative subchondral sclerosis at the 1st MTP and 1st IP joints. There is chronic resorption of the head of the 5th proximal phalanx. The 5th middle and distal phalanges appear normal. No acute osseous abnormality identified. Generalized soft tissue swelling. No subcutaneous gas. IMPRESSION: Chronic and degenerative osseous changes throughout the left foot. No acute osseous abnormality identified. Generalized soft tissue swelling. Electronically Signed   By: Genevie Ann M.D.   On: 03/17/2017 15:40    Time Spent in minutes  30   Lala Lund M.D on 03/20/2017 at 11:21 AM  Between 7am to 7pm - Pager - [phone] ( page via [email or web address], text pages only, please mention full 10 digit call back number). After 7pm go to [email or web address] - password [facility]

## 2017-03-21 LAB — BASIC METABOLIC PANEL
Anion gap: 9 (ref 5–15)
BUN: 12 mg/dL (ref 6–20)
CALCIUM: 8 mg/dL — AB (ref 8.9–10.3)
CO2: 27 mmol/L (ref 22–32)
CREATININE: 1.63 mg/dL — AB (ref 0.44–1.00)
Chloride: 107 mmol/L (ref 101–111)
GFR calc non Af Amer: 32 mL/min — ABNORMAL LOW (ref >60)
GFR, EST AFRICAN AMERICAN: 37 mL/min — AB (ref >60)
Glucose, Bld: 77 mg/dL (ref 65–99)
Potassium: 3.5 mmol/L (ref 3.5–5.1)
SODIUM: 143 mmol/L (ref 135–145)

## 2017-03-21 LAB — CBC WITH DIFFERENTIAL/PLATELET
BAND NEUTROPHILS: 0 %
BASOS ABS: 0 10*3/uL (ref 0.0–0.1)
BASOS ABS: 0 10*3/uL (ref 0.0–0.1)
BASOS PCT: 0 %
BASOS PCT: 0 %
Blasts: 0 %
EOS ABS: 0 10*3/uL (ref 0.0–0.7)
EOS ABS: 0 10*3/uL (ref 0.0–0.7)
Eosinophils Relative: 0 %
Eosinophils Relative: 0 %
HCT: 26.4 % — ABNORMAL LOW (ref 36.0–46.0)
HEMATOCRIT: 24.5 % — AB (ref 36.0–46.0)
HEMOGLOBIN: 8.7 g/dL — AB (ref 12.0–15.0)
Hemoglobin: 7.8 g/dL — ABNORMAL LOW (ref 12.0–15.0)
LYMPHS ABS: 0.2 10*3/uL — AB (ref 0.7–4.0)
Lymphocytes Relative: 26 %
Lymphocytes Relative: 23 %
Lymphs Abs: 0.4 10*3/uL — ABNORMAL LOW (ref 0.7–4.0)
MCH: 28.2 pg (ref 26.0–34.0)
MCH: 28.9 pg (ref 26.0–34.0)
MCHC: 31.8 g/dL (ref 30.0–36.0)
MCHC: 33 g/dL (ref 30.0–36.0)
MCV: 88.4 fL (ref 78.0–100.0)
MCV: 87.7 fL (ref 78.0–100.0)
MONO ABS: 0.1 10*3/uL (ref 0.1–1.0)
MYELOCYTES: 0 %
Metamyelocytes Relative: 0 %
Monocytes Absolute: 0.1 10*3/uL (ref 0.1–1.0)
Monocytes Relative: 12 %
Monocytes Relative: 5 %
NEUTROS ABS: 0.5 10*3/uL — AB (ref 1.7–7.7)
NEUTROS PCT: 72 %
NRBC: 0 /100{WBCs}
Neutro Abs: 1.3 10*3/uL — ABNORMAL LOW (ref 1.7–7.7)
Neutrophils Relative %: 62 %
Other: 0 %
PROMYELOCYTES ABS: 0 %
Platelets: 11 10*3/uL — CL (ref 150–400)
Platelets: 33 10*3/uL — ABNORMAL LOW (ref 150–400)
RBC: 2.77 MIL/uL — ABNORMAL LOW (ref 3.87–5.11)
RBC: 3.01 MIL/uL — ABNORMAL LOW (ref 3.87–5.11)
RDW: 15.9 % — AB (ref 11.5–15.5)
RDW: 15.4 % (ref 11.5–15.5)
WBC: 0.8 10*3/uL — CL (ref 4.0–10.5)
WBC: 1.8 10*3/uL — ABNORMAL LOW (ref 4.0–10.5)

## 2017-03-21 LAB — LACTATE DEHYDROGENASE: LDH: 718 U/L — AB (ref 98–192)

## 2017-03-21 LAB — MAGNESIUM: Magnesium: 1.4 mg/dL — ABNORMAL LOW (ref 1.7–2.4)

## 2017-03-21 LAB — PREPARE RBC (CROSSMATCH)

## 2017-03-21 MED ORDER — ACETAMINOPHEN 325 MG PO TABS
650.0000 mg | ORAL_TABLET | Freq: Four times a day (QID) | ORAL | Status: DC | PRN
  Administered 2017-03-21: 650 mg via ORAL
  Filled 2017-03-21: qty 2

## 2017-03-21 MED ORDER — SODIUM CHLORIDE 0.9 % IV SOLN
Freq: Once | INTRAVENOUS | Status: AC
  Administered 2017-03-21: 14:00:00 via INTRAVENOUS

## 2017-03-21 MED ORDER — MAGNESIUM SULFATE 2 GM/50ML IV SOLN: 2.0000 g | Freq: Once | INTRAVENOUS | Status: DC

## 2017-03-21 MED ORDER — HEPARIN SOD (PORK) LOCK FLUSH 100 UNIT/ML IV SOLN
500.0000 [IU] | INTRAVENOUS | Status: AC | PRN
  Administered 2017-03-21: 500 [IU]

## 2017-03-21 NOTE — Progress Notes (Signed)
Paged MD to inform of platelet count 11 and hgb of 7.8 will continue to monitor patient and await orders.

## 2017-03-21 NOTE — Discharge Summary (Signed)
Physician Discharge Summary  Jo Collins XIP:[id number] DOB: 1950-08-12 DOA: 03/16/2017  PCP: Deland Pretty, MD  Admit date: 03/16/2017 Discharge date: 03/21/2017  Admitted From:Home Disposition:  Home  Discharge Condition:Stable CODE STATUS:Full Diet recommendation: Heart Healthy  Brief/Interim Summary: Jo Collins a 67 y.o.femalewith medical history significant ofrelapsed diffuse large B cell lymphoma, sciatica , GERD, anemia, chronic back pain, Morbid obesity, last chemo on 3/8 Presented to cancer center for another cycle of chemo, but was found to be dehydrated, pancytopenic, and in acute renal failure, workup suggested that she had tumor lysis syndrome along with some diarrhea prior to coming to the hospital. Patient was started on IV fluids after which her kidney function improved.  Patient was also evaluated by her by her oncologist Dr.Kale. Patient's hospital course was also remarkable for anemia and thrombocytopenia.  She received a total of 3 units of packed RBC and 3 units of platelets on this admission. Patient's hemoglobin and platelets level improved after transfusion today.  I have discussed with Dr.Kale this morning and he has requested me to discharge her after the transfusion.  She will follow-up with him as an outpatient.  Following problems were addressed during her hospitalization:  1.  ARF :Caused by combination of tumor lysis syndrome, elevated uric acid along with diarrhea induced dehydration.  She was treated with IV fluids, allopurinol and a single dose of Elitek. Uric acid levels have now normalized.Renal function has improved. Check BMP as an outpatient in a week.  We have encouraged her for increasing  fluid intake.   2.  Relapsed large B-cell lymphoma:  Undergoing chemotherapy under the care of Dr. Irene Limbo, with severe pancytopenia induced by chemotherapy, continue supportive care.  Last chemo on 03/09/2017.  She also has follow-up with [facility] for possible bone  marrow transplant.    3.  Pancytopenia:So far she has received 3 units of packed RBC and 3 units of platelets this admission, also received Neulasta several days ago in the office.  Case discussed with her oncologist Dr. Irene Limbo.  4.  Chronic back pain:  Due to severe spinal stenosis at L4-L5.  Supportive care.  5.  GERD:Stable no acute issues.  6.  History of paroxysmal A. QBH:ALPF into Afib on 03/19/2016, echocardiogram from 1 year ago reviewed with EF of 65%, stable TSH.  Mali vas 2 score of at least 2.  She was not on anticoagulation but aspirin only, currently not on antiplatelet medications due to pancytopenia, responded well to Lopressor, currently in sinus, blood pressure too low for long-term beta-blocker.       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 2) Follow up with your PCP and oncologist as an outpatient.  3) Check CBC/BMP in  a week. 4) Take plenty of fluids.   Increase activity slowly   Complete by:  As directed    Practitioner attestation of consent   Complete by:  As directed    I, the ordering practitioner, attest that I have discussed with the patient the benefits, risks, side effects, alternatives, likelihood of  achieving goals and potential problems during recovery for the procedure listed.   Procedure:  Blood Product(s)     Allergies as of 03/21/2017   No Known Allergies     Medication List    TAKE these medications   acetaminophen 500 MG tablet Commonly known as:  TYLENOL Take 1,000 mg by mouth every 6 (six) hours as needed for moderate pain.   aspirin EC 81 MG tablet Take 1 tablet (81 mg total) by mouth daily.   dexamethasone 4 MG tablet Commonly known as:  DECADRON 40mg  po daily with breakfast from D1 to D4 each cycle of chemotherapy   furosemide 20 MG tablet Commonly known as:  LASIX Take 1 tablet (20 mg total) by mouth daily.   gabapentin 300 MG capsule Commonly known as:  NEURONTIN 300mg  po with breakfast and lunch and 600mg  at bedtime.      Follow-up Information    Deland Pretty, MD. Schedule an appointment as soon as possible for a visit in 1 week(s).   Specialty:  Internal Medicine Contact information: [address] [place] [address] [phone]          No Known Allergies  Consultations: Oncology  Procedures/Studies: Dg Chest Port 1 View  Result Date: 03/17/2017 CLINICAL DATA:  SOB today EXAM: PORTABLE CHEST 1 VIEW COMPARISON:  CT 02/06/2017 FINDINGS: Port in the anterior chest wall with tip in distal SVC. Normal cardiac silhouette. There is RIGHT pleural effusion and atelectasis. Upper lungs clear. No pneumothorax. IMPRESSION: 1. Persistent RIGHT pleural effusion and RIGHT basilar scarring. Chronic findings. 2. LEFT lung clear Electronically Signed   By: Suzy Bouchard M.D.   On: 03/17/2017 07:37   Dg Foot Complete Left  Result Date: 03/17/2017 CLINICAL DATA:  67 year old female with left foot pain for 1 month. Plantar and lateral aspect pain with no known injury. EXAM: LEFT FOOT - COMPLETE 3+ VIEW COMPARISON:  None. FINDINGS: Degenerative spurring of the plantar and dorsal calcaneus. Dorsal degenerative spurring throughout the  tarsal bones, including dystrophic calcification or spurring along the medial anterior talus. Accessory ossicle adjacent to the cuboid. The metatarsals are intact. There is mild joint space loss and degenerative subchondral sclerosis at the 1st MTP and 1st IP joints. There is chronic resorption of the head of the 5th proximal phalanx. The 5th middle and distal phalanges appear normal. No acute osseous abnormality identified. Generalized soft tissue swelling. No subcutaneous gas. IMPRESSION: Chronic and degenerative osseous changes throughout the left foot. No acute osseous abnormality identified. Generalized soft tissue swelling. Electronically Signed   By: Genevie Ann M.D.   On: 03/17/2017 15:40   (Echo, Carotid, EGD, Colonoscopy, ERCP)    Subjective: Patient seen and examined the bedside this morning.  Remains comfortable.  Found to have thrombocytopenia this morning.  She is being transfused with 1 unit of PRBC and one unit of platelets today.  Discharge Exam: Vitals:   03/21/17 1400 03/21/17 1630  BP: 130/68 132/63  Pulse: 72 71  Resp: 16 18  Temp: 99.9 F (37.7 C) 100.3 F (37.9 C)  SpO2: 100% 100%   Vitals:   03/21/17 1142 03/21/17  1338 03/21/17 1400 03/21/17 1630  BP: 116/65 123/71 130/68 132/63  Pulse: 64 67 72 71  Resp: 16 16 16 18   Temp: 99.2 F (37.3 C) 99 F (37.2 C) 99.9 F (37.7 C) 100.3 F (37.9 C)  TempSrc: Oral Oral Oral Oral  SpO2: 99% 100% 100% 100%  Weight:      Height:        General: Pt is alert, awake, not in acute distress, morbidly obese Cardiovascular: RRR, S1/S2 +, no rubs, no gallops Respiratory: CTA bilaterally, no wheezing, no rhonchi Abdominal: Soft, NT, ND, bowel sounds + Extremities: no edema, no cyanosis    The results of significant diagnostics from this hospitalization (including imaging, microbiology, ancillary and laboratory) are listed below for reference.     Microbiology: No results found for this or any previous visit (from the past  240 hour(s)).   Labs: BNP (last 3 results) Recent Labs    08/07/16 0023 10/07/16 2300  BNP 39.4 69.6   Basic Metabolic Panel: Recent Labs  Lab 03/17/17 0458 03/18/17 0447 03/19/17 0521 03/20/17 0500 03/21/17 0431  NA 147* 143 142 143 143  K 3.9 4.0 3.4* 3.3* 3.5  CL 114* 111 106 106 107  CO2 21* 22 26 27 27   GLUCOSE 95 106* 88 79 77  BUN 48* 33* 25* 15 12  CREATININE 2.49* 2.15* 1.86* 1.69* 1.63*  CALCIUM 8.0* 8.2* 8.0* 8.0* 8.0*  MG  --  1.6* 1.6*  --  1.4*   Liver Function Tests: Recent Labs  Lab 03/16/17 0753 03/18/17 0447 03/19/17 0521 03/20/17 0500  AST 71* 60* 55* 50*  ALT 54 41 38 34  ALKPHOS 67 51 51 49  BILITOT 0.5 0.5 0.6 0.6  PROT 6.1* 5.4* 4.9* 4.9*  ALBUMIN 3.0* 2.8* 2.5* 2.5*   No results for input(s): LIPASE, AMYLASE in the last 168 hours. No results for input(s): AMMONIA in the last 168 hours. CBC: Recent Labs  Lab 03/19/17 0521 03/19/17 1714 03/20/17 0500 03/21/17 0431 03/21/17 1622  WBC 0.9* 0.6* 0.3* 0.8* 1.8*  NEUTROABS 0.7* 0.3* 0.2 0.5* PENDING  HGB 8.2* 8.5*  8.6* 8.4* 7.8* 8.7*  HCT 25.5* 27.0*  26.4* 26.1* 24.5* 26.4*  MCV 85.3 88.2 88.8 88.4 87.7  PLT 11* 26* 23* 11* 33*   Cardiac Enzymes: No results for input(s): CKTOTAL, CKMB, CKMBINDEX, TROPONINI in the last 168 hours. BNP: Invalid input(s): POCBNP CBG: No results for input(s): GLUCAP in the last 168 hours. D-Dimer No results for input(s): DDIMER in the last 72 hours. Hgb A1c No results for input(s): HGBA1C in the last 72 hours. Lipid Profile No results for input(s): CHOL, HDL, LDLCALC, TRIG, CHOLHDL, LDLDIRECT in the last 72 hours. Thyroid function studies Recent Labs    03/19/17 1714  TSH 1.474   Anemia work up No results for input(s): VITAMINB12, FOLATE, FERRITIN, TIBC, IRON, RETICCTPCT in the last 72 hours. Urinalysis    Component Value Date/Time   COLORURINE YELLOW 03/16/2017 2345   APPEARANCEUR CLEAR 03/16/2017 2345   LABSPEC 1.011 03/16/2017 2345    PHURINE 6.0 03/16/2017 2345   GLUCOSEU NEGATIVE 03/16/2017 2345   HGBUR LARGE (A) 03/16/2017 2345   BILIRUBINUR NEGATIVE 03/16/2017 2345   KETONESUR NEGATIVE 03/16/2017 2345   PROTEINUR 30 (A) 03/16/2017 2345   NITRITE NEGATIVE 03/16/2017 2345   LEUKOCYTESUR SMALL (A) 03/16/2017 2345   Sepsis Labs Invalid input(s): PROCALCITONIN,  WBC,  LACTICIDVEN Microbiology No results found for this or any previous visit (from the past 240 hour(s)).  Time coordinating discharge: Over 30 minutes  SIGNED:   Shelly Coss, MD  [facility] 03/21/2017, 5:02 PM Pager [phone]  If 7PM-7AM, please contact night-coverage [email or web address] Password [email or web address]

## 2017-03-22 LAB — BPAM PLATELET PHERESIS
BLOOD PRODUCT EXPIRATION DATE: 201903202359
ISSUE DATE / TIME: 201903201142
Unit Type and Rh: 5100

## 2017-03-22 LAB — TYPE AND SCREEN
ABO/RH(D): A POS
Antibody Screen: NEGATIVE
Unit division: 0

## 2017-03-22 LAB — BPAM RBC
BLOOD PRODUCT EXPIRATION DATE: 201904032359
ISSUE DATE / TIME: 201903201331
UNIT TYPE AND RH: 6200

## 2017-03-22 LAB — PREPARE PLATELET PHERESIS: UNIT DIVISION: 0

## 2017-03-23 ENCOUNTER — Other Ambulatory Visit: Payer: Medicare Other

## 2017-03-23 ENCOUNTER — Ambulatory Visit: Payer: Medicare Other | Admitting: Hematology

## 2017-03-25 ENCOUNTER — Emergency Department (HOSPITAL_COMMUNITY)
Admission: EM | Admit: 2017-03-25 | Discharge: 2017-03-25 | Disposition: A | Discharge status: Home / Self Care | Payer: Medicare Other | Attending: Emergency Medicine | Admitting: Emergency Medicine

## 2017-03-25 ENCOUNTER — Other Ambulatory Visit: Payer: Self-pay

## 2017-03-25 DIAGNOSIS — Z79899 Other long term (current) drug therapy: Secondary | ICD-10-CM | POA: Diagnosis not present

## 2017-03-25 DIAGNOSIS — M48061 Spinal stenosis, lumbar region without neurogenic claudication: Secondary | ICD-10-CM | POA: Diagnosis not present

## 2017-03-25 DIAGNOSIS — R200 Anesthesia of skin: Secondary | ICD-10-CM | POA: Insufficient documentation

## 2017-03-25 DIAGNOSIS — Z7982 Long term (current) use of aspirin: Secondary | ICD-10-CM | POA: Diagnosis not present

## 2017-03-25 DIAGNOSIS — M21372 Foot drop, left foot: Secondary | ICD-10-CM | POA: Diagnosis not present

## 2017-03-25 DIAGNOSIS — Z8572 Personal history of non-Hodgkin lymphomas: Secondary | ICD-10-CM | POA: Insufficient documentation

## 2017-03-25 DIAGNOSIS — R531 Weakness: Secondary | ICD-10-CM | POA: Diagnosis present

## 2017-03-25 DIAGNOSIS — G8929 Other chronic pain: Secondary | ICD-10-CM

## 2017-03-25 DIAGNOSIS — M5442 Lumbago with sciatica, left side: Secondary | ICD-10-CM | POA: Insufficient documentation

## 2017-03-25 DIAGNOSIS — R29898 Other symptoms and signs involving the musculoskeletal system: Secondary | ICD-10-CM

## 2017-03-25 MED ORDER — HYDROMORPHONE HCL 1 MG/ML IJ SOLN
0.5000 mg | Freq: Once | INTRAMUSCULAR | Status: AC
  Administered 2017-03-25: 0.5 mg via INTRAMUSCULAR
  Filled 2017-03-25: qty 1

## 2017-03-25 MED ORDER — HYDROCODONE-ACETAMINOPHEN 5-325 MG PO TABS
1.0000 | ORAL_TABLET | Freq: Once | ORAL | Status: AC
  Administered 2017-03-25: 1 via ORAL
  Filled 2017-03-25: qty 1

## 2017-03-25 MED ORDER — HYDROCODONE-ACETAMINOPHEN 5-325 MG PO TABS: 1.0000 | ORAL_TABLET | Freq: Four times a day (QID) | ORAL | 0 refills | Status: AC | PRN

## 2017-03-25 MED ORDER — PREDNISONE 20 MG PO TABS
60.0000 mg | ORAL_TABLET | Freq: Once | ORAL | Status: AC
  Administered 2017-03-25: 60 mg via ORAL
  Filled 2017-03-25: qty 3

## 2017-03-25 MED ORDER — PREDNISONE 20 MG PO TABS: ORAL_TABLET | ORAL | 0 refills | Status: AC

## 2017-03-25 NOTE — ED Provider Notes (Signed)
[facility] DEPT Provider Note   CSN: [id number] Arrival date & time: 03/25/17  1314     History   Chief Complaint Chief Complaint  Patient presents with  . Extremity Weakness    HPI Jo Collins is a 67 y.o. 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  Chart review reveals she had a bx of the R ext iliac lymph node chain on 01/23/17 for concern of B-cell lymphoma recurrence; she states that since then she started having LLE weakness/numbness and L lower back pain.  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 Ninfa Linden of ortho was consulted and recommended starting Gabapentin (363m at breakfast and lunch and 6043mat bedtime) and having outpatient PT and ortho f/up.  Of note, she also had a R iliac bone marrow biopsy on 02/09/17.  She left the hospital and unfortunately did not have a chance to f/up with ortho; she continued with her gabapentin, and continued her chemo.   She was then admitted on 3/15-20/19 for ARF and tumor lysis syndrome following chemo on 03/09/17, found to be pancytopenic, given 3 PRBCs and 3U platelets which improved her numbers, she was rehydrated and ultimately discharged home on 03/21/17; on the discharge summary from that admission, it mentions that she is supposed to f/up with ortho regarding her lumbar stenosis issues but had not yet done so.  She states that she still hasn't followed up with them, but her symptoms continue  to worsen so she came in for re-evaluation.  She describes her pain as 10/10 intermittent sharp left upper buttock/lumbar pain which radiates into the lateral left leg, worsens with laying on her left side and walking, has been unrelieved with gabapentin, and minimally improved with tramadol 50 mg and Tylenol.  She states that she continues to have left foot and lateral lower leg numbness as well as left foot weakness.  She is unable to dorsiflex the foot, but she states that she can still bend her hip and knee normally; reports that this weakness was present when the symptoms started, and she denies any acute/new issues.  She states that her symptoms have just gradually worsened with time which is why she came in today.  Her oncologist is Dr. KeClaiborne Billingsand her PCP is Dr. PhShelia Media She takes steroids after chemo, but not otherwise.  No other pain meds have been ordered for her to take at home.  She uses a walker at home to help ambulate.  Her husband is with her today, and helps her at home.   She denies fevers, chills, CP, SOB, abd pain, N/V/D/C, hematuria, dysuria, incontinence of urine/stool, saddle anesthesia/cauda equina symptoms, hip pain or other arthralgias, tingling, or any other complaints at this time.   The history is provided by the patient and medical records. No language interpreter was used.  Extremity Weakness  This is a chronic problem. The current episode started more than 1 week ago. The problem occurs constantly. The problem has been gradually  worsening. Pertinent negatives include no chest pain, no abdominal pain and no shortness of breath. The symptoms are aggravated by walking. The symptoms are relieved by narcotics and acetaminophen. Treatments tried: tylenol and tramadol. The treatment provided mild relief.    Past Medical History:  Diagnosis Date  . Abdominal pain, RUQ   . Abnormal EKG 08/07/2016  . Acute midline thoracic back pain   . Acute respiratory distress 09/25/2016  .  Anemia 08/07/2016  . Chronic low back pain 09/25/2016  . Cough   . DLBCL (diffuse large B cell lymphoma) ([place]) 08/17/2016  . GERD (gastroesophageal reflux disease)   . History of chemotherapy   . Hypokalemia 09/25/2016  . 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 Lymphadenopathy   . Morbid obesity with BMI of 50.0-59.9, adult ([place])   . Neutrophilic leukocytosis 08/07/7617  . NHL (non-Hodgkin's lymphoma) ([place])   . Pleural effusion on right 08/07/2016  . Recurrent pleural effusion on right 10/08/2016  . Recurrent right pleural effusion 09/25/2016  . S/P thoracentesis   . Shortness of breath 08/07/2016  . SOB (shortness of breath) 09/26/2016    Patient Active Problem List   Diagnosis Date Noted  . 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 Foot pain, left   . Pancytopenia ([place]) 03/16/2017  . ARF (acute renal failure) ([place]) 03/16/2017  . Thrombocytopenia ([place]) 02/09/2017  . Left-sided low back pain with left-sided sciatica   . Abnormal CT of the abdomen   . Positive blood culture 02/07/2017  . Numbness 02/05/2017  . AF (paroxysmal atrial fibrillation) ([place]) 01/08/2017  . LVH (left ventricular hypertrophy) 01/08/2017  . S/P thoracentesis   . Cough   . Neutrophilic leukocytosis 50/93/2671  . Recurrent pleural effusion on right 10/08/2016  . SOB (shortness of breath) 09/26/2016  . Acute respiratory distress 09/25/2016  . Chronic low back pain 09/25/2016  . Hypokalemia 09/25/2016  . Recurrent right pleural effusion 09/25/2016  . Diffuse large B cell lymphoma ([place]) 08/17/2016  . Acute midline thoracic back pain   . Lymphadenopathy   . Morbid obesity with BMI of 50.0-59.9, adult ([place])   . NHL (non-Hodgkin's lymphoma) ([place])   . Abdominal pain, RUQ   . Pleural effusion on right 08/07/2016  . Abnormal EKG 08/07/2016  . 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 Anemia 08/07/2016  . GERD (gastroesophageal reflux disease) 08/07/2016  . Shortness of breath 08/07/2016    Past Surgical  History:  Procedure Laterality Date  . ABDOMINAL HYSTERECTOMY    . IR CV LINE INJECTION  10/26/2016  . IR FLUORO GUIDE PORT INSERTION RIGHT  08/18/2016  . IR FLUORO GUIDE PORT INSERTION RIGHT  11/03/2016  . IR REMOVAL TUN CV CATH W/O FL  11/03/2016  . IR THORACENTESIS ASP PLEURAL SPACE W/IMG GUIDE  08/07/2016  . IR THORACENTESIS ASP PLEURAL SPACE W/IMG GUIDE  08/14/2016  . IR US GUIDE VASC ACCESS RIGHT  08/18/2016  . IR US GUIDE VASC ACCESS RIGHT  11/03/2016     OB History   None      Home Medications    Prior to Admission medications   Medication Sig Start Date End Date Taking? Authorizing Provider  aspirin EC 81 MG tablet Take 1 tablet (81 mg total) by mouth daily. 01/08/17  Yes Fay Records, MD  dexamethasone (DECADRON) 4 MG tablet 36m po daily with breakfast from D1 to D4 each cycle of chemotherapy 03/09/17  Yes KBrunetta Genera MD  furosemide (LASIX) 20 MG tablet Take 1 tablet (20 mg  total) by mouth daily. 03/09/17  Yes Brunetta Genera, MD  gabapentin (NEURONTIN) 300 MG capsule 357m po with breakfast and lunch and 6054mat bedtime. 03/09/17  Yes KaBrunetta GeneraMD  traMADol (ULTRAM) 50 MG tablet Take 50-100 mg by mouth every 6 (six) hours as needed for moderate pain or severe pain.   Yes [provider]  acetaminophen (TYLENOL) 500 MG tablet Take 1,000 mg by mouth every 6 (six) hours as needed for moderate pain.    [provider]    Family History Family History  Problem Relation Age of Onset  . COPD Mother   . Diabetes Mellitus II Sister   . Diabetes Mellitus II Brother   . Diabetes Mellitus II Sister   . Stroke Sister   . Hypertension Sister   . Diabetes Mellitus II Sister     Social History Social History   Tobacco Use  . Smoking status: Former Smoker    Packs/day: 0.50    Years: 47.00    Pack years: 23.50    Types: Cigarettes    Last attempt to quit: 07/28/2016    Years since quitting: 0.6  . Smokeless tobacco: Never Used  Substance  Use Topics  . Alcohol use: No  . Drug use: No     Allergies   Patient has no known allergies.   Review of Systems Review of Systems  Constitutional: Negative for chills and fever.  Respiratory: Negative for shortness of breath.   Cardiovascular: Negative for chest pain.  Gastrointestinal: Negative for abdominal pain, constipation, diarrhea, nausea and vomiting.  Genitourinary: Negative for difficulty urinating (no incontinence), dysuria and hematuria.  Musculoskeletal: Positive for back pain, extremity weakness and myalgias.  Skin: Negative for color change.  Allergic/Immunologic: Positive for immunocompromised state (on chemo).  Neurological: Positive for weakness and numbness.  Psychiatric/Behavioral: Negative for confusion.   All other systems reviewed and are negative for acute change except as noted in the HPI.    Physical Exam Updated Vital Signs BP 109/81 (BP Location: Left Arm)   Pulse 86   Temp 99.2 F (37.3 C) (Oral)   Resp 18   Wt 114.3 kg (252 lb)   SpO2 99%   BMI 47.61 kg/m   Physical Exam  Constitutional: She is oriented to person, place, and time. Vital signs are normal. She appears well-developed and well-nourished.  Non-toxic appearance. No distress.  Afebrile, nontoxic, NAD  HENT:  Head: Normocephalic and atraumatic.  Mouth/Throat: Oropharynx is clear and moist and mucous membranes are normal.  Eyes: Conjunctivae and EOM are normal. Right eye exhibits no discharge. Left eye exhibits no discharge.  Neck: Normal range of motion. Neck supple.  Cardiovascular: Normal rate, regular rhythm, normal heart sounds and intact distal pulses. Exam reveals no gallop and no friction rub.  No murmur heard. Pulmonary/Chest: Effort normal and breath sounds normal. No respiratory distress. She has no decreased breath sounds. She has no wheezes. She has no rhonchi. She has no rales.  Abdominal: Soft. Normal appearance and bowel sounds are normal. She exhibits no  distension. There is no tenderness. There is no rigidity, no rebound, no guarding, no CVA tenderness, no tenderness at McBurney's point and negative Murphy's sign.  Musculoskeletal: Normal range of motion.       Lumbar back: She exhibits tenderness. She exhibits no bony tenderness, no swelling, no deformity and no spasm.       Back:  Lumbar spine without focal midline spinous process TTP, no bony stepoffs or deformities,  but with focal TTP over the SI joint area on the L side, just lateral to the proximal gluteal cleft; no other focal paraspinous muscle TTP or muscle spasms. No other tenderness to hip joint or LLE. FROM intact at hip and knee on the left side. Plantarflexion of L foot 5/5, however dorsiflexion 0/5 with complete foot drop noted. Sensation diminished in lateral lower leg and to the dorsum of the foot, in the L5 dermatomal distribution. Hip/knee strength 5/5 bilaterally. Strength and sensation grossly intact in all other areas of the extremities, +SLR on L side but neg on R. No overlying skin changes to the lumbar spine or LLE. Distal pulses intact.   Neurological: She is alert and oriented to person, place, and time. She has normal strength. No sensory deficit.  Skin: Skin is warm, dry and intact. No rash noted.  Psychiatric: She has a normal mood and affect.  Nursing note and vitals reviewed.    ED Treatments / Results  Labs (all labs ordered are listed, but only abnormal results are displayed) Labs Reviewed - No data to display  EKG None  Radiology No results found.   02/08/17 MRI Lumbar Spine WO contrast: Study Result: CLINICAL DATA:  New onset severe low back and left lower extremity pain in a patient with a history of B-cell lymphoma.  EXAM: MRI LUMBAR SPINE WITHOUT CONTRAST  TECHNIQUE: Multiplanar, multisequence MR imaging of the lumbar spine was performed. No intravenous contrast was administered.  COMPARISON:  CT chest, abdomen and pelvis 02/06/2017. PET CT  scan 01/05/2017.  FINDINGS: This exam is limited by the patient's body habitus. The standard coil could not be used. Additionally, the patient could not lie flat.  Segmentation: Standard.  Alignment: Facet degenerative disease results in 0.3 cm anterolisthesis L4 on L5. Otherwise maintained.  Vertebrae: No fracture. A few small hemangiomas are seen. Marrow signal is somewhat decreased on T1 weighted imaging.  Conus medullaris and cauda equina: Conus extends to the L2 level. Conus and cauda equina appear normal.  Paraspinal and other soft tissues: Negative.  Disc levels:  T11-12 and T12-L1 are imaged in the sagittal plane only. Disc bulging at both levels is more prominent at T12-L1 but the central canal and foramina appear open.  L1-2: Negative.  L2-3: There is a shallow disc bulge with some ligamentum flavum thickening and facet degenerative change. The central spinal canal and foramina appear open.  L3-4: Shallow disc bulge, moderate facet degenerative disease and ligamentum flavum thickening. The central canal and foramina remain open.  L4-5: Advanced facet degenerative change and ligamentum flavum thickening are seen. The disc is uncovered and bulging. There is severe central canal stenosis. Mild to moderate foraminal narrowing is worse on the right.  L5-S1: Right worse than left facet degenerative disease with associated small right effusion. The patient has a shallow disc bulge but the central canal is widely patent. Mild right foraminal narrowing is noted. The left foramen is open.  IMPRESSION: Spondylosis worst at L4-5 where there is severe central canal stenosis and mild to moderate foraminal narrowing, worse on the right. Advanced facet degenerative disease at this level results in 0.3 cm anterolisthesis.  Right worse than left facet arthropathy at L5-S1 with an associated right facet joint effusion. Facet arthropathy and disc cause  mild right foraminal narrowing at this level. The central canal and left foramen are open.  Somewhat decreased T1 signal in all imaged bones could be related to the patient's history of lymphoma or obesity.  Electronically Signed   By: Inge Rise M.D.   On: 02/08/2017 11:50     Procedures Procedures (including critical care time)  Medications Ordered in ED Medications  HYDROmorphone (DILAUDID) injection 0.5 mg (0.5 mg Intramuscular Given 03/25/17 1644)  predniSONE (DELTASONE) tablet 60 mg (60 mg Oral Given 03/25/17 1644)  HYDROcodone-acetaminophen (NORCO/VICODIN) 5-325 MG per tablet 1 tablet (1 tablet Oral Given 03/25/17 1801)     Initial Impression / Assessment and Plan / ED Course  I have reviewed the triage vital signs and the nursing notes.  Pertinent labs & imaging results that were available during my care of the patient were reviewed by me and considered in my medical decision making (see chart for details).     67 y.o. female here with gradually worsening L lower back pain radiating into her L leg with L leg weakness and numbness. Was seen for this back in February and actually admitted, had MRI lumbar spine which showed L4-5 spondylosis with severe central canal stenosis, Dr. Ninfa Linden of ortho recommended gabapentin and outpt PT and ortho f/up; unfortunately she hasn't been able to f/up with them yet, and feels as though her symptoms are worsening. On exam, tenderness focally at L lumbar area near the SI joint region, sensation diminished in L5 dermatomal distribution in lower leg, L foot drop with 0/5 dorsiflexion strength, plantarflexion 5/5, knee/hip flexion/extension 5/5, +SLR, distal pulses intact, no overlying skin changes in the spine or leg. Her symptoms match somewhat with what her MRI has shown, it seems that she needs improved pain control and outpatient f/up, but will touch base with onc to see if further imaging would be needed. Discussed case with my  attending Dr. Eulis Foster who agrees with plan.   3:41 PM Dr. Alen Blew of oncology called back, stated that he has no specific recommendations for imaging from onc standpoint, and has no objection to steroid use if ortho recommends it. Will touch base with ortho.   3:59 PM Dr. Durward Fortes of ortho called back, states that if nothing acute has occurred, then it would be reasonable to just treat pain and use steroids to help with her symptoms, and have her f/up outpatient; does not feel we need further imaging at this time, assuming there is nothing acute (which pt states there isn't). Will give pain med and steroid, and reassess; as long as she feels some improvement and can ambulate here, then I think we could d/c home with pain meds and steroid burst. Will reassess shortly.   5:39 PM Pt feeling better and ambulatory with assistance as if she was using a walker (which she uses at home). Will give her a dose of PO pain med to help provider long lasting relief prior to d/c. Will d/c home with pred burst and norco. Discussed heat use as well. Advised f/up with ortho in 3-5 days for recheck and ongoing management of her symptoms. Strict return precautions advised. I explained the diagnosis and have given explicit precautions to return to the ER including for any other new or worsening symptoms. The patient understands and accepts the medical plan as it's been dictated and I have answered their questions. Discharge instructions concerning home care and prescriptions have been given. The patient is STABLE and is discharged to home in good condition.    Final Clinical Impressions(s) / ED Diagnoses   Final diagnoses:  Chronic left-sided low back pain with left-sided sciatica  Left foot drop  Left leg weakness  Left leg numbness  Spinal  stenosis of lumbar region, unspecified whether neurogenic claudication present    ED Discharge Orders        Ordered    predniSONE (DELTASONE) 20 MG tablet     03/25/17 1737     HYDROcodone-acetaminophen (NORCO) 5-325 MG tablet  Every 6 hours PRN     03/25/17 [address] [place], [place] 03/25/17 1802    Daleen Bo, MD 03/27/17 1013

## 2017-03-25 NOTE — Discharge Instructions (Addendum)
Your symptoms are due to pinching of the nerves in your lower back. Stop taking tramadol, and use vicodin as directed as needed for pain; do not drive or operate machinery while taking this medication. Take prednisone as directed until completed, starting tomorrow since today's dose was given to you here in the ER. Continue to take your gabapentin and all of your usual home medications. Use heat to your back to help with pain as well, no more than 20 minutes per hour at a time. Follow up with the orthopedist in 3-5 days for recheck of symptoms and ongoing management of your condition. Return to the ER for emergent changes or worsening symptoms.

## 2017-03-25 NOTE — ED Notes (Signed)
Pt reports a biopsy completed 1 month ago. Pt reports since biopsy she has had left leg weakness and numbness since the biopsy. Pt reports worsening of weakness and numbness recently. Pt reports inability to walk at this time due to the left leg weakness. Pt reports no issues with right leg. Pt denies any other weakness or numbness in body. Pt currently receiving chemo for Hodgkins Lymphoma

## 2017-03-26 ENCOUNTER — Telehealth: Payer: Self-pay | Admitting: *Deleted

## 2017-03-26 ENCOUNTER — Telehealth: Payer: Self-pay

## 2017-03-26 NOTE — Telephone Encounter (Signed)
"  When is my next appointment for chemotherapy?  Discharged from hospital March 20, 2017 and not sure if I come Wednesday or not."  Advised to arrive 10:00 am, Friday, March 30, 2017 to register for lab, MD and infusion treatment.  No further questions or needs at this time.

## 2017-03-26 NOTE — Telephone Encounter (Signed)
Attempt to call pt home phone x 2, but VM full. Called mobile phone and left message for pt to notify office if she is able to come in this afternoon for labs at 1345 and visit with Dr. Irene Limbo at 1400 to discuss plan for cycle three and have CBC, CMP, and Hold Tube drawn in the event that the pt may need blood. Provided office number to call back [phone], if pt able to come in for visit.

## 2017-03-27 ENCOUNTER — Telehealth: Payer: Self-pay

## 2017-03-27 ENCOUNTER — Other Ambulatory Visit: Payer: Self-pay

## 2017-03-27 DIAGNOSIS — C8338 Diffuse large B-cell lymphoma, lymph nodes of multiple sites: Secondary | ICD-10-CM

## 2017-03-27 NOTE — Telephone Encounter (Signed)
Attempt to schedule pt sooner for lab work and f/u with Dr. Irene Limbo. Pt unable to come today and attempt to schedule for lab and f/u tomorrow (3/27), but pt has another appt at 1pm, and would not be able to come at the available times. Pt to keep appointments on Friday as scheduled. Pt verbalized understanding.

## 2017-03-28 ENCOUNTER — Other Ambulatory Visit: Payer: Medicare Other

## 2017-03-28 ENCOUNTER — Ambulatory Visit: Payer: Medicare Other | Admitting: Hematology

## 2017-03-30 ENCOUNTER — Inpatient Hospital Stay: Payer: Medicare Other

## 2017-03-30 ENCOUNTER — Other Ambulatory Visit: Payer: Self-pay

## 2017-03-30 ENCOUNTER — Inpatient Hospital Stay (HOSPITAL_BASED_OUTPATIENT_CLINIC_OR_DEPARTMENT_OTHER): Payer: Medicare Other | Admitting: Hematology

## 2017-03-30 ENCOUNTER — Telehealth: Payer: Self-pay

## 2017-03-30 ENCOUNTER — Encounter: Payer: Self-pay | Admitting: Hematology

## 2017-03-30 ENCOUNTER — Emergency Department (HOSPITAL_COMMUNITY): Payer: Medicare Other

## 2017-03-30 ENCOUNTER — Encounter (HOSPITAL_COMMUNITY): Payer: Self-pay

## 2017-03-30 ENCOUNTER — Inpatient Hospital Stay (HOSPITAL_COMMUNITY)
Admission: EM | Admit: 2017-03-30 | Discharge: 2017-05-02 | DRG: 871 | Disposition: E | Payer: Medicare Other | Attending: Family Medicine | Admitting: Family Medicine

## 2017-03-30 VITALS — BP 104/64 | HR 155 | Temp 98.4°F | Resp 20 | Ht 61.0 in | Wt 233.2 lb

## 2017-03-30 DIAGNOSIS — L0501 Pilonidal cyst with abscess: Secondary | ICD-10-CM | POA: Diagnosis present

## 2017-03-30 DIAGNOSIS — Y95 Nosocomial condition: Secondary | ICD-10-CM | POA: Diagnosis present

## 2017-03-30 DIAGNOSIS — N189 Chronic kidney disease, unspecified: Secondary | ICD-10-CM

## 2017-03-30 DIAGNOSIS — G510 Bell's palsy: Secondary | ICD-10-CM | POA: Diagnosis not present

## 2017-03-30 DIAGNOSIS — I480 Paroxysmal atrial fibrillation: Secondary | ICD-10-CM | POA: Diagnosis present

## 2017-03-30 DIAGNOSIS — M79672 Pain in left foot: Secondary | ICD-10-CM | POA: Diagnosis not present

## 2017-03-30 DIAGNOSIS — K219 Gastro-esophageal reflux disease without esophagitis: Secondary | ICD-10-CM | POA: Diagnosis present

## 2017-03-30 DIAGNOSIS — M7989 Other specified soft tissue disorders: Secondary | ICD-10-CM | POA: Diagnosis not present

## 2017-03-30 DIAGNOSIS — N170 Acute kidney failure with tubular necrosis: Secondary | ICD-10-CM | POA: Diagnosis not present

## 2017-03-30 DIAGNOSIS — C8330 Diffuse large B-cell lymphoma, unspecified site: Secondary | ICD-10-CM | POA: Diagnosis not present

## 2017-03-30 DIAGNOSIS — C8339 Diffuse large B-cell lymphoma, extranodal and solid organ sites: Secondary | ICD-10-CM | POA: Diagnosis present

## 2017-03-30 DIAGNOSIS — Z515 Encounter for palliative care: Secondary | ICD-10-CM | POA: Diagnosis not present

## 2017-03-30 DIAGNOSIS — T451X5A Adverse effect of antineoplastic and immunosuppressive drugs, initial encounter: Secondary | ICD-10-CM | POA: Diagnosis present

## 2017-03-30 DIAGNOSIS — N183 Chronic kidney disease, stage 3 (moderate): Secondary | ICD-10-CM | POA: Diagnosis not present

## 2017-03-30 DIAGNOSIS — H5789 Other specified disorders of eye and adnexa: Secondary | ICD-10-CM

## 2017-03-30 DIAGNOSIS — F17211 Nicotine dependence, cigarettes, in remission: Secondary | ICD-10-CM | POA: Diagnosis not present

## 2017-03-30 DIAGNOSIS — M21372 Foot drop, left foot: Secondary | ICD-10-CM | POA: Diagnosis present

## 2017-03-30 DIAGNOSIS — Z79899 Other long term (current) drug therapy: Secondary | ICD-10-CM

## 2017-03-30 DIAGNOSIS — Z9221 Personal history of antineoplastic chemotherapy: Secondary | ICD-10-CM

## 2017-03-30 DIAGNOSIS — I483 Typical atrial flutter: Secondary | ICD-10-CM | POA: Diagnosis present

## 2017-03-30 DIAGNOSIS — I361 Nonrheumatic tricuspid (valve) insufficiency: Secondary | ICD-10-CM | POA: Diagnosis not present

## 2017-03-30 DIAGNOSIS — F40240 Claustrophobia: Secondary | ICD-10-CM | POA: Diagnosis present

## 2017-03-30 DIAGNOSIS — I440 Atrioventricular block, first degree: Secondary | ICD-10-CM | POA: Diagnosis not present

## 2017-03-30 DIAGNOSIS — D696 Thrombocytopenia, unspecified: Secondary | ICD-10-CM | POA: Diagnosis not present

## 2017-03-30 DIAGNOSIS — N179 Acute kidney failure, unspecified: Secondary | ICD-10-CM

## 2017-03-30 DIAGNOSIS — C8590 Non-Hodgkin lymphoma, unspecified, unspecified site: Secondary | ICD-10-CM | POA: Diagnosis present

## 2017-03-30 DIAGNOSIS — J9601 Acute respiratory failure with hypoxia: Secondary | ICD-10-CM

## 2017-03-30 DIAGNOSIS — G8929 Other chronic pain: Secondary | ICD-10-CM | POA: Diagnosis present

## 2017-03-30 DIAGNOSIS — Z7189 Other specified counseling: Secondary | ICD-10-CM | POA: Diagnosis not present

## 2017-03-30 DIAGNOSIS — H532 Diplopia: Secondary | ICD-10-CM | POA: Diagnosis not present

## 2017-03-30 DIAGNOSIS — Z66 Do not resuscitate: Secondary | ICD-10-CM | POA: Diagnosis not present

## 2017-03-30 DIAGNOSIS — E790 Hyperuricemia without signs of inflammatory arthritis and tophaceous disease: Secondary | ICD-10-CM | POA: Diagnosis not present

## 2017-03-30 DIAGNOSIS — R4701 Aphasia: Secondary | ICD-10-CM | POA: Diagnosis not present

## 2017-03-30 DIAGNOSIS — E875 Hyperkalemia: Secondary | ICD-10-CM | POA: Diagnosis not present

## 2017-03-30 DIAGNOSIS — Z7982 Long term (current) use of aspirin: Secondary | ICD-10-CM

## 2017-03-30 DIAGNOSIS — C8338 Diffuse large B-cell lymphoma, lymph nodes of multiple sites: Secondary | ICD-10-CM | POA: Diagnosis not present

## 2017-03-30 DIAGNOSIS — R531 Weakness: Secondary | ICD-10-CM | POA: Diagnosis not present

## 2017-03-30 DIAGNOSIS — I4892 Unspecified atrial flutter: Secondary | ICD-10-CM | POA: Diagnosis not present

## 2017-03-30 DIAGNOSIS — D630 Anemia in neoplastic disease: Secondary | ICD-10-CM | POA: Diagnosis present

## 2017-03-30 DIAGNOSIS — H05221 Edema of right orbit: Secondary | ICD-10-CM | POA: Diagnosis not present

## 2017-03-30 DIAGNOSIS — D72829 Elevated white blood cell count, unspecified: Secondary | ICD-10-CM

## 2017-03-30 DIAGNOSIS — H4901 Third [oculomotor] nerve palsy, right eye: Secondary | ICD-10-CM | POA: Diagnosis present

## 2017-03-30 DIAGNOSIS — R0600 Dyspnea, unspecified: Secondary | ICD-10-CM

## 2017-03-30 DIAGNOSIS — Z90710 Acquired absence of both cervix and uterus: Secondary | ICD-10-CM

## 2017-03-30 DIAGNOSIS — L8990 Pressure ulcer of unspecified site, unspecified stage: Secondary | ICD-10-CM

## 2017-03-30 DIAGNOSIS — D61818 Other pancytopenia: Secondary | ICD-10-CM | POA: Diagnosis present

## 2017-03-30 DIAGNOSIS — A419 Sepsis, unspecified organism: Secondary | ICD-10-CM | POA: Diagnosis present

## 2017-03-30 DIAGNOSIS — J90 Pleural effusion, not elsewhere classified: Secondary | ICD-10-CM | POA: Diagnosis not present

## 2017-03-30 DIAGNOSIS — I4589 Other specified conduction disorders: Secondary | ICD-10-CM | POA: Diagnosis not present

## 2017-03-30 DIAGNOSIS — Z6841 Body Mass Index (BMI) 40.0 and over, adult: Secondary | ICD-10-CM | POA: Diagnosis not present

## 2017-03-30 DIAGNOSIS — Z789 Other specified health status: Secondary | ICD-10-CM | POA: Diagnosis not present

## 2017-03-30 DIAGNOSIS — R0902 Hypoxemia: Secondary | ICD-10-CM | POA: Diagnosis not present

## 2017-03-30 DIAGNOSIS — J440 Chronic obstructive pulmonary disease with acute lower respiratory infection: Secondary | ICD-10-CM | POA: Diagnosis present

## 2017-03-30 DIAGNOSIS — Z79891 Long term (current) use of opiate analgesic: Secondary | ICD-10-CM

## 2017-03-30 DIAGNOSIS — R222 Localized swelling, mass and lump, trunk: Secondary | ICD-10-CM | POA: Diagnosis present

## 2017-03-30 DIAGNOSIS — D6481 Anemia due to antineoplastic chemotherapy: Secondary | ICD-10-CM | POA: Diagnosis present

## 2017-03-30 DIAGNOSIS — I484 Atypical atrial flutter: Secondary | ICD-10-CM | POA: Diagnosis not present

## 2017-03-30 DIAGNOSIS — J189 Pneumonia, unspecified organism: Secondary | ICD-10-CM | POA: Diagnosis not present

## 2017-03-30 DIAGNOSIS — E872 Acidosis: Secondary | ICD-10-CM | POA: Diagnosis present

## 2017-03-30 DIAGNOSIS — E876 Hypokalemia: Secondary | ICD-10-CM | POA: Diagnosis present

## 2017-03-30 DIAGNOSIS — M549 Dorsalgia, unspecified: Secondary | ICD-10-CM | POA: Diagnosis not present

## 2017-03-30 DIAGNOSIS — R4182 Altered mental status, unspecified: Secondary | ICD-10-CM

## 2017-03-30 DIAGNOSIS — R590 Localized enlarged lymph nodes: Secondary | ICD-10-CM | POA: Diagnosis present

## 2017-03-30 DIAGNOSIS — I4891 Unspecified atrial fibrillation: Secondary | ICD-10-CM | POA: Diagnosis not present

## 2017-03-30 DIAGNOSIS — E860 Dehydration: Secondary | ICD-10-CM | POA: Diagnosis present

## 2017-03-30 DIAGNOSIS — L03317 Cellulitis of buttock: Secondary | ICD-10-CM | POA: Diagnosis present

## 2017-03-30 DIAGNOSIS — I5031 Acute diastolic (congestive) heart failure: Secondary | ICD-10-CM | POA: Diagnosis not present

## 2017-03-30 DIAGNOSIS — D6959 Other secondary thrombocytopenia: Secondary | ICD-10-CM | POA: Diagnosis not present

## 2017-03-30 DIAGNOSIS — Z8249 Family history of ischemic heart disease and other diseases of the circulatory system: Secondary | ICD-10-CM

## 2017-03-30 DIAGNOSIS — Z7952 Long term (current) use of systemic steroids: Secondary | ICD-10-CM

## 2017-03-30 DIAGNOSIS — R197 Diarrhea, unspecified: Secondary | ICD-10-CM | POA: Diagnosis not present

## 2017-03-30 DIAGNOSIS — N181 Chronic kidney disease, stage 1: Secondary | ICD-10-CM | POA: Diagnosis not present

## 2017-03-30 DIAGNOSIS — I959 Hypotension, unspecified: Secondary | ICD-10-CM | POA: Diagnosis not present

## 2017-03-30 DIAGNOSIS — I509 Heart failure, unspecified: Secondary | ICD-10-CM

## 2017-03-30 DIAGNOSIS — I517 Cardiomegaly: Secondary | ICD-10-CM | POA: Diagnosis not present

## 2017-03-30 DIAGNOSIS — R740 Nonspecific elevation of levels of transaminase and lactic acid dehydrogenase [LDH]: Secondary | ICD-10-CM | POA: Diagnosis present

## 2017-03-30 DIAGNOSIS — Z87891 Personal history of nicotine dependence: Secondary | ICD-10-CM

## 2017-03-30 DIAGNOSIS — Z825 Family history of asthma and other chronic lower respiratory diseases: Secondary | ICD-10-CM

## 2017-03-30 DIAGNOSIS — J9690 Respiratory failure, unspecified, unspecified whether with hypoxia or hypercapnia: Secondary | ICD-10-CM

## 2017-03-30 LAB — CMP (CANCER CENTER ONLY)
ALBUMIN: 2.9 g/dL — AB (ref 3.5–5.0)
ALK PHOS: 121 U/L (ref 40–150)
ALT: 23 U/L (ref 0–55)
ANION GAP: 15 — AB (ref 3–11)
AST: 45 U/L — AB (ref 5–34)
BILIRUBIN TOTAL: 1 mg/dL (ref 0.2–1.2)
BUN: 38 mg/dL — AB (ref 7–26)
CALCIUM: 9.2 mg/dL (ref 8.4–10.4)
CO2: 24 mmol/L (ref 22–29)
Chloride: 100 mmol/L (ref 98–109)
Creatinine: 2.48 mg/dL — ABNORMAL HIGH (ref 0.60–1.10)
GFR, EST NON AFRICAN AMERICAN: 19 mL/min — AB (ref >60)
GFR, Est AFR Am: 22 mL/min — ABNORMAL LOW (ref >60)
GLUCOSE: 98 mg/dL (ref 70–140)
Potassium: 3.4 mmol/L — ABNORMAL LOW (ref 3.5–5.1)
Sodium: 139 mmol/L (ref 136–145)
TOTAL PROTEIN: 5.8 g/dL — AB (ref 6.4–8.3)

## 2017-03-30 LAB — CBC WITH DIFFERENTIAL/PLATELET
BAND NEUTROPHILS: 0 %
BASOS ABS: 0 10*3/uL (ref 0.0–0.1)
BASOS PCT: 1 %
BASOS PCT: 0 %
BLASTS: 0 %
Basophils Absolute: 0.2 10*3/uL — ABNORMAL HIGH (ref 0.0–0.1)
EOS ABS: 0 10*3/uL (ref 0.0–0.5)
EOS ABS: 0 10*3/uL (ref 0.0–0.7)
Eosinophils Relative: 0 %
Eosinophils Relative: 0 %
HCT: 28.9 % — ABNORMAL LOW (ref 34.8–46.6)
HCT: 30.2 % — ABNORMAL LOW (ref 36.0–46.0)
HEMOGLOBIN: 9.9 g/dL — AB (ref 12.0–15.0)
Hemoglobin: 9.5 g/dL — ABNORMAL LOW (ref 11.6–15.9)
Lymphocytes Relative: 12 %
Lymphocytes Relative: 16 %
Lymphs Abs: 2.4 10*3/uL (ref 0.9–3.3)
Lymphs Abs: 3 10*3/uL (ref 0.7–4.0)
MCH: 27.9 pg (ref 25.1–34.0)
MCH: 28.1 pg (ref 26.0–34.0)
MCHC: 32.9 g/dL (ref 31.5–36.0)
MCHC: 32.8 g/dL (ref 30.0–36.0)
MCV: 85 fL (ref 79.5–101.0)
MCV: 85.8 fL (ref 78.0–100.0)
METAMYELOCYTES PCT: 0 %
MONO ABS: 0.9 10*3/uL (ref 0.1–0.9)
MONO ABS: 1.5 10*3/uL — AB (ref 0.1–1.0)
MONOS PCT: 4 %
Monocytes Relative: 8 %
Myelocytes: 0 %
Neutro Abs: 16.7 10*3/uL — ABNORMAL HIGH (ref 1.5–6.5)
Neutro Abs: 14 10*3/uL — ABNORMAL HIGH (ref 1.7–7.7)
Neutrophils Relative %: 83 %
Neutrophils Relative %: 76 %
Other: 0 %
PLATELETS: 122 10*3/uL — AB (ref 145–400)
PLATELETS: 102 10*3/uL — AB (ref 150–400)
PROMYELOCYTES ABS: 0 %
RBC: 3.4 MIL/uL — ABNORMAL LOW (ref 3.70–5.45)
RBC: 3.52 MIL/uL — ABNORMAL LOW (ref 3.87–5.11)
RDW: 17.5 % — AB (ref 11.2–14.5)
RDW: 17.4 % — AB (ref 11.5–15.5)
WBC: 20.2 10*3/uL — ABNORMAL HIGH (ref 3.9–10.3)
WBC: 18.5 10*3/uL — ABNORMAL HIGH (ref 4.0–10.5)
nRBC: 2 /100 WBC — ABNORMAL HIGH

## 2017-03-30 LAB — COMPREHENSIVE METABOLIC PANEL
ALBUMIN: 2.8 g/dL — AB (ref 3.5–5.0)
ALK PHOS: 105 U/L (ref 38–126)
ALT: 25 U/L (ref 14–54)
AST: 47 U/L — AB (ref 15–41)
Anion gap: 17 — ABNORMAL HIGH (ref 5–15)
BILIRUBIN TOTAL: 1.1 mg/dL (ref 0.3–1.2)
BUN: 38 mg/dL — AB (ref 6–20)
CALCIUM: 8.9 mg/dL (ref 8.9–10.3)
CO2: 21 mmol/L — AB (ref 22–32)
Chloride: 103 mmol/L (ref 101–111)
Creatinine, Ser: 2.39 mg/dL — ABNORMAL HIGH (ref 0.44–1.00)
GFR calc Af Amer: 23 mL/min — ABNORMAL LOW (ref >60)
GFR calc non Af Amer: 20 mL/min — ABNORMAL LOW (ref >60)
GLUCOSE: 89 mg/dL (ref 65–99)
Potassium: 3.6 mmol/L (ref 3.5–5.1)
Sodium: 141 mmol/L (ref 135–145)
TOTAL PROTEIN: 5.5 g/dL — AB (ref 6.5–8.1)

## 2017-03-30 LAB — RETICULOCYTES
RBC.: 3.4 MIL/uL — AB (ref 3.70–5.45)
RETIC CT PCT: 1.6 % (ref 0.7–2.1)
Retic Count, Absolute: 54.4 10*3/uL (ref 33.7–90.7)

## 2017-03-30 LAB — I-STAT CG4 LACTIC ACID, ED
LACTIC ACID, VENOUS: 2.6 mmol/L — AB (ref 0.5–1.9)
Lactic Acid, Venous: 3 mmol/L (ref 0.5–1.9)

## 2017-03-30 LAB — SAMPLE TO BLOOD BANK

## 2017-03-30 LAB — URIC ACID: URIC ACID, SERUM: 9.4 mg/dL — AB (ref 2.6–7.4)

## 2017-03-30 LAB — LACTATE DEHYDROGENASE: LDH: 2726 U/L — AB (ref 125–245)

## 2017-03-30 LAB — MRSA PCR SCREENING: MRSA by PCR: NEGATIVE

## 2017-03-30 MED ORDER — FENTANYL CITRATE (PF) 100 MCG/2ML IJ SOLN
50.0000 ug | Freq: Once | INTRAMUSCULAR | Status: AC
  Administered 2017-03-30: 50 ug via INTRAVENOUS
  Filled 2017-03-30: qty 2

## 2017-03-30 MED ORDER — AMIODARONE LOAD VIA INFUSION: 150.0000 mg | Freq: Once | INTRAVENOUS | Status: DC

## 2017-03-30 MED ORDER — SODIUM CHLORIDE 0.9 % IV SOLN
INTRAVENOUS | Status: DC
  Administered 2017-03-30 – 2017-04-03 (×10): via INTRAVENOUS

## 2017-03-30 MED ORDER — ACETAMINOPHEN 325 MG PO TABS
650.0000 mg | ORAL_TABLET | Freq: Four times a day (QID) | ORAL | Status: DC | PRN
  Administered 2017-03-31 – 2017-04-11 (×5): 650 mg via ORAL
  Filled 2017-03-30 (×5): qty 2

## 2017-03-30 MED ORDER — ONDANSETRON HCL 4 MG/2ML IJ SOLN
4.0000 mg | Freq: Four times a day (QID) | INTRAMUSCULAR | Status: DC | PRN
  Administered 2017-03-30 – 2017-04-06 (×2): 4 mg via INTRAVENOUS
  Filled 2017-03-30 (×2): qty 2

## 2017-03-30 MED ORDER — AMIODARONE IV BOLUS ONLY 150 MG/100ML: 150.0000 mg | Freq: Once | INTRAVENOUS | Status: DC

## 2017-03-30 MED ORDER — ACETAMINOPHEN 325 MG PO TABS: 650.0000 mg | ORAL_TABLET | ORAL | Status: DC | PRN

## 2017-03-30 MED ORDER — SODIUM CHLORIDE 0.9 % IV BOLUS
1000.0000 mL | Freq: Once | INTRAVENOUS | Status: AC
  Administered 2017-03-30: 1000 mL via INTRAVENOUS

## 2017-03-30 MED ORDER — AMIODARONE LOAD VIA INFUSION
150.0000 mg | Freq: Once | INTRAVENOUS | Status: AC
  Administered 2017-03-30: 150 mg via INTRAVENOUS
  Filled 2017-03-30: qty 83.34

## 2017-03-30 MED ORDER — DILTIAZEM LOAD VIA INFUSION
10.0000 mg | Freq: Once | INTRAVENOUS | Status: AC
  Administered 2017-03-30: 10 mg via INTRAVENOUS
  Filled 2017-03-30: qty 10

## 2017-03-30 MED ORDER — AMIODARONE IV BOLUS ONLY 150 MG/100ML
150.0000 mg | Freq: Once | INTRAVENOUS | Status: AC
  Administered 2017-03-30: 150 mg via INTRAVENOUS
  Filled 2017-03-30: qty 100

## 2017-03-30 MED ORDER — VANCOMYCIN HCL IN DEXTROSE 1-5 GM/200ML-% IV SOLN
1000.0000 mg | Freq: Once | INTRAVENOUS | Status: AC
  Administered 2017-03-30: 1000 mg via INTRAVENOUS
  Filled 2017-03-30: qty 200

## 2017-03-30 MED ORDER — MORPHINE SULFATE (PF) 4 MG/ML IV SOLN
4.0000 mg | Freq: Once | INTRAVENOUS | Status: AC
  Administered 2017-03-30: 4 mg via INTRAVENOUS
  Filled 2017-03-30: qty 1

## 2017-03-30 MED ORDER — LIDOCAINE-EPINEPHRINE (PF) 2 %-1:200000 IJ SOLN
10.0000 mL | Freq: Once | INTRAMUSCULAR | Status: AC
  Administered 2017-03-30: 10 mL
  Filled 2017-03-30: qty 20

## 2017-03-30 MED ORDER — SODIUM CHLORIDE 0.9 % IV SOLN
1.0000 g | INTRAVENOUS | Status: DC
  Filled 2017-03-30: qty 1

## 2017-03-30 MED ORDER — HEPARIN BOLUS VIA INFUSION
4000.0000 [IU] | Freq: Once | INTRAVENOUS | Status: AC
  Administered 2017-03-30: 4000 [IU] via INTRAVENOUS
  Filled 2017-03-30: qty 4000

## 2017-03-30 MED ORDER — MORPHINE SULFATE (PF) 4 MG/ML IV SOLN
2.0000 mg | INTRAVENOUS | Status: DC | PRN
  Administered 2017-03-30 – 2017-04-01 (×5): 2 mg via INTRAVENOUS
  Filled 2017-03-30 (×5): qty 1

## 2017-03-30 MED ORDER — HEPARIN (PORCINE) IN NACL 100-0.45 UNIT/ML-% IJ SOLN
1050.0000 [IU]/h | INTRAMUSCULAR | Status: DC
  Administered 2017-03-30: 1050 [IU]/h via INTRAVENOUS
  Filled 2017-03-30: qty 250

## 2017-03-30 MED ORDER — METOPROLOL TARTRATE 5 MG/5ML IV SOLN
5.0000 mg | Freq: Once | INTRAVENOUS | Status: AC
  Administered 2017-03-30: 5 mg via INTRAVENOUS
  Filled 2017-03-30: qty 5

## 2017-03-30 MED ORDER — VANCOMYCIN HCL IN DEXTROSE 750-5 MG/150ML-% IV SOLN
750.0000 mg | INTRAVENOUS | Status: DC
Start: 2017-04-01 — End: 2017-03-31

## 2017-03-30 MED ORDER — AMIODARONE HCL IN DEXTROSE 360-4.14 MG/200ML-% IV SOLN
60.0000 mg/h | INTRAVENOUS | Status: AC
  Filled 2017-03-30 (×2): qty 200

## 2017-03-30 MED ORDER — VANCOMYCIN HCL 10 G IV SOLR
1250.0000 mg | Freq: Once | INTRAVENOUS | Status: AC
  Administered 2017-03-30: 1250 mg via INTRAVENOUS
  Filled 2017-03-30: qty 1250

## 2017-03-30 MED ORDER — SODIUM CHLORIDE 0.9 % IV BOLUS (SEPSIS)
2000.0000 mL | Freq: Once | INTRAVENOUS | Status: AC
  Administered 2017-03-30: 2000 mL via INTRAVENOUS

## 2017-03-30 MED ORDER — CEFEPIME HCL 2 G IJ SOLR
2.0000 g | Freq: Once | INTRAMUSCULAR | Status: AC
  Administered 2017-03-30: 2 g via INTRAVENOUS
  Filled 2017-03-30: qty 2

## 2017-03-30 MED ORDER — DILTIAZEM HCL-DEXTROSE 100-5 MG/100ML-% IV SOLN (PREMIX)
5.0000 mg/h | INTRAVENOUS | Status: DC
  Administered 2017-03-30: 5 mg/h via INTRAVENOUS
  Filled 2017-03-30: qty 100

## 2017-03-30 MED ORDER — AMIODARONE HCL IN DEXTROSE 360-4.14 MG/200ML-% IV SOLN
30.0000 mg/h | INTRAVENOUS | Status: DC
  Administered 2017-04-01 – 2017-04-02 (×3): 30 mg/h via INTRAVENOUS
  Filled 2017-03-30 (×5): qty 200

## 2017-03-30 MED ORDER — LEVALBUTEROL HCL 0.63 MG/3ML IN NEBU
0.6300 mg | INHALATION_SOLUTION | Freq: Three times a day (TID) | RESPIRATORY_TRACT | Status: DC | PRN
  Administered 2017-03-30 – 2017-04-04 (×3): 0.63 mg via RESPIRATORY_TRACT
  Filled 2017-03-30 (×3): qty 3

## 2017-03-30 NOTE — H&P (Signed)
History and Physical    Jo Collins [id number]:[id number] DOB: [birth year] DOA: 03/21/2017  PCP: Deland Pretty, MD  Patient coming from: Home  Chief Complaint: double vision, tachycardia Patient unable to provide history at this time therefore history obtained from the ER physicians note, medical records and partly from family at bedside.  HPI: Jo Collins is a 67 y.o. female with multiple medical problems including  paroxysmal atrial fibrillation not on anticoagulation, large B cell lymphoma, ongoing chemotherapy with recent tumor lysis syndrome resulting in acute renal failure, anemia and hospitalization, prior biopsy of the right iliac crest in January and ongoing issues with low back pain with radiation down to the left leg with weakness and numbness who was seen last week in the emergency department for complaints of leg pain was ultimately given steroids and pain medication.  Patient states she initially started experiencing double vision from her right eye, drooping in 1 week of feeling sleepy and having no energy that is not improved.  Patient saw her oncologist Dr. Claiborne Billings today who sent her down for further evaluation.  Patient does not think she has had a fever denies any nausea, vomiting or abdominal pain.  She continues to complain of pain in her left leg but family also notes within this last week she has had redness and swelling in her tailbone area.  She has had no urinary problems and denies palpitations, chest pain, shortness of breath or cough.  Patient stopped using hydrocodone thinking that is what was causing the diplopia but her double vision has not changed.  She states her vision from her left eye is completely normal.  She denied any unilateral weakness or numbness currently.    ED Course: Lab work in the ED showed WBC 18.5, CXR showed findings concerning for pneumonia. She also was found to be hypotensive A.fib with RVR. Started on IV Vancomycin and Cefepime. Results for SHIRLA, HODGKISS  (MRN [id number]) as of 03/28/2017 19:28  Ref. Range 03/05/2017 13:35 03/28/2017 15:23  Lactic Acid, Venous Latest Ref Range: 0.5 - 1.9 mmol/L 3.00 (HH) 2.60 (HH)  She was given fluid bolus.   Review of Systems: As per HPI otherwise 10 point review of systems negative.    Past Medical History:  Diagnosis Date  . Abdominal pain, RUQ   . Abnormal EKG 08/07/2016  . Acute midline thoracic back pain   . Acute respiratory distress 09/25/2016  . Anemia 08/07/2016  . Chronic low back pain 09/25/2016  . Cough   . DLBCL (diffuse large B cell lymphoma) (Jo Collins) 08/17/2016  . GERD (gastroesophageal reflux disease)   . History of chemotherapy   . Hypokalemia 09/25/2016  . 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 Lymphadenopathy   . Morbid obesity with BMI of 50.0-59.9, adult (Jo Collins)   . Neutrophilic leukocytosis 49/07/261  . NHL (non-Hodgkin's lymphoma) (Jo Collins)   . Pleural effusion on right 08/07/2016  . Recurrent pleural effusion on right 10/08/2016  . Recurrent right pleural effusion 09/25/2016  . S/P thoracentesis   . Shortness of breath 08/07/2016  . SOB (shortness of breath) 09/26/2016    Past Surgical History:  Procedure Laterality Date  . ABDOMINAL HYSTERECTOMY    . IR CV LINE INJECTION  10/26/2016  . IR FLUORO GUIDE PORT INSERTION RIGHT  08/18/2016  . IR FLUORO GUIDE PORT INSERTION RIGHT  11/03/2016  . IR REMOVAL TUN CV CATH W/O FL  11/03/2016  . IR THORACENTESIS ASP PLEURAL SPACE W/IMG GUIDE  08/07/2016  . IR THORACENTESIS ASP PLEURAL SPACE W/IMG GUIDE  08/14/2016  . IR US GUIDE VASC ACCESS RIGHT  08/18/2016  . IR US GUIDE VASC ACCESS RIGHT  11/03/2016     reports that she quit smoking about 8 months ago. Her smoking use included cigarettes. She has a 23.50 pack-year smoking history. She has never used smokeless tobacco. She reports that she does not drink alcohol or use drugs.  No Known Allergies  Family History  Problem Relation Age of Onset  . COPD Mother   . Diabetes Mellitus II Sister   . Diabetes  Mellitus II Brother   . Diabetes Mellitus II Sister   . Stroke Sister   . Hypertension Sister   . Diabetes Mellitus II Sister     Prior to Admission medications   Medication Sig Start Date End Date Taking? Authorizing Provider  acetaminophen (TYLENOL) 500 MG tablet Take 1,000 mg by mouth every 6 (six) hours as needed for moderate pain.   Yes [provider]  dexamethasone (DECADRON) 4 MG tablet 40mg  po daily with breakfast from D1 to D4 each cycle of chemotherapy 03/09/17  Yes Brunetta Genera, MD  gabapentin (NEURONTIN) 300 MG capsule 300mg  po with breakfast and lunch and 600mg  at bedtime. 03/09/17  Yes Brunetta Genera, MD  HYDROcodone-acetaminophen (NORCO) 5-325 MG tablet Take 1 tablet by mouth every 6 (six) hours as needed for severe pain. 03/25/17  Yes Street, [place], PA-C  predniSONE (DELTASONE) 10 MG tablet 6 TABLETS BY MOUTH DAILY FOR 5 DAYS STARTING 03/26/17 03/26/17  Yes [provider]  traMADol (ULTRAM) 50 MG tablet Take 50-100 mg by mouth every 6 (six) hours as needed for moderate pain or severe pain.   Yes [provider]  aspirin EC 81 MG tablet Take 1 tablet (81 mg total) by mouth daily. Patient not taking: Reported on 03/22/2017 01/08/17   Fay Records, MD  furosemide (LASIX) 20 MG tablet Take 1 tablet (20 mg total) by mouth daily. Patient not taking: Reported on 03/09/2017 03/09/17   Brunetta Genera, MD  predniSONE (DELTASONE) 20 MG tablet 3 tabs po daily x 5 days STARTING ON 03/26/17 Patient not taking: Reported on 03/15/2017 03/25/17   Street, [place], [place]    Physical Exam: Vitals:   03/25/2017 1534 03/19/2017 1600 03/07/2017 1615 03/03/2017 1630  BP: (!) 95/57 95/71 105/67 107/86  Pulse: (!) 155 (!) 154 (!) 154   Resp: (!) 21 (!) 24 18 18   SpO2: 97% 96% 96%     Constitutional: obese female, awake and oriented Vitals:   03/12/2017 1534 03/11/2017 1600 03/03/2017 1615 03/03/2017 1630  BP: (!) 95/57 95/71 105/67 107/86  Pulse: (!) 155 (!) 154 (!) 154    Resp: (!) 21 (!) 24 18 18   SpO2: 97% 96% 96%    Eyes: PERRL, mild protuberance right eye ENMT: Mucous membranes are moist. Posterior pharynx clear of any exudate or lesions.Normal dentition.  Neck: normal, supple, no thyromegaly Respiratory: decreased breath sounds lower lobes, rhonchi heard Cardiovascular: irregularly irregular.  Abdomen: obese female, no tenderness, no masses palpated. No hepatosplenomegaly. Bowel sounds positive.  Musculoskeletal: left lower extremity weakness, swelling, no edema right LE Skin: no rashes, lesions, ulcers. No induration Neurologic: right eye limited medial gaze? left lower extremity weakness, able to move other extremities Psychiatric: Normal judgment and insight.   Labs on Admission: I have personally reviewed following labs and imaging studies  CBC: Recent Labs  Lab 03/12/2017 1038 03/29/2017 1309  WBC 20.2* 18.5*  NEUTROABS 16.7* 14.0*  HGB 9.5* 9.9*  HCT  28.9* 30.2*  MCV 85.0 85.8  PLT 122* 016*   Basic Metabolic Panel: Recent Labs  Lab 03/23/2017 1038 03/24/2017 1309  NA 139 141  K 3.4* 3.6  CL 100 103  CO2 24 21*  GLUCOSE 98 89  BUN 38* 38*  CREATININE 2.48* 2.39*  CALCIUM 9.2 8.9   GFR: Estimated Creatinine Clearance: 26 mL/min (A) (by C-G formula based on SCr of 2.39 mg/dL (H)). Liver Function Tests: Recent Labs  Lab 03/12/2017 1038 03/15/2017 1309  AST 45* 47*  ALT 23 25  ALKPHOS 121 105  BILITOT 1.0 1.1  PROT 5.8* 5.5*  ALBUMIN 2.9* 2.8*   No results for input(s): LIPASE, AMYLASE in the last 168 hours. No results for input(s): AMMONIA in the last 168 hours. Coagulation Profile: No results for input(s): INR, PROTIME in the last 168 hours. Cardiac Enzymes: No results for input(s): CKTOTAL, CKMB, CKMBINDEX, TROPONINI in the last 168 hours. BNP (last 3 results) No results for input(s): PROBNP in the last 8760 hours. HbA1C: No results for input(s): HGBA1C in the last 72 hours. CBG: No results for input(s): GLUCAP in  the last 168 hours. Lipid Profile: No results for input(s): CHOL, HDL, LDLCALC, TRIG, CHOLHDL, LDLDIRECT in the last 72 hours. Thyroid Function Tests: No results for input(s): TSH, T4TOTAL, FREET4, T3FREE, THYROIDAB in the last 72 hours. Anemia Panel: Recent Labs    03/29/2017 1038  RETICCTPCT 1.6   Urine analysis:    Component Value Date/Time   COLORURINE YELLOW 03/16/2017 2345   APPEARANCEUR CLEAR 03/16/2017 2345   LABSPEC 1.011 03/16/2017 2345   PHURINE 6.0 03/16/2017 2345   GLUCOSEU NEGATIVE 03/16/2017 2345   HGBUR LARGE (A) 03/16/2017 2345   BILIRUBINUR NEGATIVE 03/16/2017 2345   Jo Collins 03/16/2017 2345   PROTEINUR 30 (A) 03/16/2017 2345   NITRITE NEGATIVE 03/16/2017 2345   LEUKOCYTESUR SMALL (A) 03/16/2017 2345    Radiological Exams on Admission: Ct Head Wo Contrast  Result Date: 03/21/2017 CLINICAL DATA:  Double vision. EXAM: CT HEAD WITHOUT CONTRAST TECHNIQUE: Contiguous axial images were obtained from the base of the skull through the vertex without intravenous contrast. COMPARISON:  MRI of February 06, 2017. FINDINGS: Brain: Mild chronic ischemic white matter disease is noted. No mass effect or midline shift is noted. Ventricular size is within normal limits. There is no evidence of mass lesion, hemorrhage or acute infarction. Vascular: No hyperdense vessel or unexpected calcification. Skull: Normal. Negative for fracture or focal lesion. Sinuses/Orbits: No acute finding. Other: None. IMPRESSION: Mild chronic ischemic white matter disease. No acute intracranial abnormality seen. Electronically Signed   By: Marijo Conception, M.D.   On: 03/19/2017 15:01   Dg Chest Port 1 View  Result Date: 03/17/2017 CLINICAL DATA:  Tachycardia EXAM: PORTABLE CHEST 1 VIEW COMPARISON:  March 17, 2017 FINDINGS: Port-A-Cath tip is in the superior vena cava. No pneumothorax. There is airspace consolidation in the right lower lobe with small right pleural effusion. Left lung is clear.  Heart is borderline enlarged with pulmonary vascularity within normal limits. No adenopathy. No bone lesions. IMPRESSION: Airspace consolidation consistent with pneumonia right lower lung zone with small right pleural effusion. Left lung clear. Stable cardiac silhouette. Port-A-Cath tip in superior vena cava. No pneumothorax. Electronically Signed   By: Lowella Grip III M.D.   On: 03/20/2017 14:06    EKG: Independently reviewed.   Assessment/Plan Principal Problem:   Rapid atrial fibrillation (HCC) Active Problems:   NHL (non-Hodgkin's lymphoma) (HCC)   Morbid obesity with BMI  of 50.0-59.9, adult (Jo Collins)   Diffuse large B cell lymphoma (Jo Collins)   Recurrent right pleural effusion   Sepsis (HCC)   Leukocytosis   Acute on chronic renal failure (HCC)   Rapid Atrial fibrillation: - Started Cardizem drip in ED but still tachy. - Cardiology consulted and started Amiodarone bolus followed by the infusion. - She will be admitted to step down. - Although has h/o paroxysmal A.fib, not on anticoagulation  Sepsis: - Concern for sepsis given findings of pneumonia on CXR, leukocytosis. - Although leukocytosis could be due to the Neulasta that she received few days ago. - Continue IV Vancomycin and Cefepime for now.  Acute on chronic renal failure: - IV hydration. - Continue to monitor BUN/cr. - Avoid nephrotoxic meds.  Right eye double vision/medial gaze affected? - Per ED physician who talked to Neurology they recommended MRA, MRV imaging once patient more stable. - Will order neurochecks.  Large Bcell lymphoma: - Patient follows with Oncology - Consult if needed.  Thrombocytopenia: - Chronic? - Continue to monitor Platelets  DVT prophylaxis: Lovenox due to high risk of DVT Code Status: Full Family Communication: Plan of care discussed with patient's husband and sister at bedside. Disposition Plan: Home Consults called: Cardiology. Dr. Harl Bowie. Admission status:  Inpatient.   Kayleeann Huxford MD [facility] Pager [phone]  If 7PM-7AM, please contact night-coverage [email or web address] Password [facility]  03/31/2017, 4:59 PM

## 2017-03-30 NOTE — ED Provider Notes (Signed)
  Physical Exam  BP 107/86   Pulse (!) 154   Resp 18   SpO2 96%   Physical Exam  ED Course/Procedures     Procedures  MDM  Care assumed from Dr. Marta Antu. Patient is admitted for pneumonia, diplopia, rapid afib. Hospitalist to admit but request cardiology consult for persistent rapid afib rate 150s despite IV metoprolol and IV cardizem drip. I called Dr. Marlou Porch from cardiology, who recommend IV amiodarone bolus and drip. Cardiology will see in AM if HR is still uncontrolled.      Drenda Freeze, MD 03/25/2017 [phone]

## 2017-03-30 NOTE — Progress Notes (Signed)
A consult was received from an ED physician for Vancomycin & Cefepime per pharmacy dosing.  The patient's profile has been reviewed for ht/wt/allergies/indication/available labs.   A one time order has been placed for Vanc 1gm & Cefepime 2gm.  Further antibiotics/pharmacy consults should be ordered by admitting physician if indicated.                       Thank you, Biagio Borg 03/04/2017  2:36 PM

## 2017-03-30 NOTE — Progress Notes (Signed)
Jo Collins Kitchen  HEMATOLOGY ONCOLOGY INPATIENT PROGRESS NOTE  Date of service: 03/09/17 Patient Care Team: Deland Pretty, MD as PCP - General (Internal Medicine)  CC:  F/u for management of relapse refractory DLBCL New rt eye proptosis/swelling with diplopia, increasing chin numbness and AMS --concerning for possible occular and CNS involvement with lymphoma  Diagnosis: Diffuse large B cell lymphoma  Current Treatment:  -Gemcitabine/Carboplatin/Dexmethasone  Previous treatment  R-CHOP with G-CSF support s/p R-CHOP x 6 cycles + IT MTX for CNS prophylaxis x 4 doses  HISTORY OF PRESENT ILLNESS: please see original clinic note for HPI  INTERVAL HISTORY:  Jo Collins is here for a scheduled follow-up of her Relapsed Diffuse Large B Cell Lymphoma. The patient is accompanied by her sister. She notes that she not doing well. She is seen in a wheel chair. She reports that her legs are getting weaker. Her left foot pain is worse than her right. She also endorses back pain.  Overall, she feels very fatigued and has a hard time keeping her eyes open. After a recent ED visit for her chronic back and leg pain, she was prescribed hydrocodone, but only took it once because she started seeing double and notes she was very tired. She no longer sees double, but she has some swelling to her bilateral eyes, R>L. She endorses some blurry vision, but no pain to her right eye. She adds it is only mildly uncomfortable. Jo Collins has also been experiencing some right sided headaches and still endorses back pain.   On review of systems, pt denies fever, chills, weight loss, decreased appetite, and mouth sores. Denies pain. Pt denies abdominal pain, nausea, vomiting and bladder and bowel changes.    REVIEW OF SYSTEMS:    A 10+ POINT REVIEW OF SYSTEMS WAS OBTAINED including neurology, dermatology, psychiatry, cardiac, respiratory, lymph, extremities, GI, GU, Musculoskeletal, constitutional, breasts, reproductive, HEENT.  All pertinent  positives are noted in the HPI.  All others are negative.  . Past Medical History:  Diagnosis Date  . Abdominal pain, RUQ   . Abnormal EKG 08/07/2016  . Acute midline thoracic back pain   . Acute respiratory distress 09/25/2016  . Anemia 08/07/2016  . Chronic low back pain 09/25/2016  . Cough   . DLBCL (diffuse large B cell lymphoma) ([place]) 08/17/2016  . GERD (gastroesophageal reflux disease)   . History of chemotherapy   . Hypokalemia 09/25/2016  . 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 Lymphadenopathy   . Morbid obesity with BMI of 50.0-59.9, adult ([place])   . Neutrophilic leukocytosis 16/01/958  . NHL (non-Hodgkin's lymphoma) ([place])   . Pleural effusion on right 08/07/2016  . Recurrent pleural effusion on right 10/08/2016  . Recurrent right pleural effusion 09/25/2016  . S/P thoracentesis   . Shortness of breath 08/07/2016  . SOB (shortness of breath) 09/26/2016    Past Surgical History:  Procedure Laterality Date  . ABDOMINAL HYSTERECTOMY    . IR CV LINE INJECTION  10/26/2016  . IR FLUORO GUIDE PORT INSERTION RIGHT  08/18/2016  . IR FLUORO GUIDE PORT INSERTION RIGHT  11/03/2016  . IR REMOVAL TUN CV CATH W/O FL  11/03/2016  . IR THORACENTESIS ASP PLEURAL SPACE W/IMG GUIDE  08/07/2016  . IR THORACENTESIS ASP PLEURAL SPACE W/IMG GUIDE  08/14/2016  . IR US GUIDE VASC ACCESS RIGHT  08/18/2016  . IR US GUIDE VASC ACCESS RIGHT  11/03/2016    . Social History   Tobacco Use  . Smoking status: Former Smoker    Packs/day: 0.50  Years: 47.00    Pack years: 23.50    Types: Cigarettes    Last attempt to quit: 07/28/2016    Years since quitting: 0.6  . Smokeless tobacco: Never Used  Substance Use Topics  . Alcohol use: No  . Drug use: No    ALLERGIES:  has No Known Allergies.  MEDICATIONS:  Current Outpatient Medications  Medication Sig Dispense Refill  . acetaminophen (TYLENOL) 500 MG tablet Take 1,000 mg by mouth every 6 (six) hours as needed for moderate pain.    Jo Collins Kitchen aspirin EC 81 MG tablet Take  1 tablet (81 mg total) by mouth daily. 90 tablet 3  . dexamethasone (DECADRON) 4 MG tablet 40mg  po daily with breakfast from D1 to D4 each cycle of chemotherapy 40 tablet 2  . furosemide (LASIX) 20 MG tablet Take 1 tablet (20 mg total) by mouth daily. 30 tablet 1  . gabapentin (NEURONTIN) 300 MG capsule 300mg  po with breakfast and lunch and 600mg  at bedtime. 120 capsule 0  . HYDROcodone-acetaminophen (NORCO) 5-325 MG tablet Take 1 tablet by mouth every 6 (six) hours as needed for severe pain. 20 tablet 0  . predniSONE (DELTASONE) 20 MG tablet 3 tabs po daily x 5 days STARTING ON 03/26/17 15 tablet 0  . traMADol (ULTRAM) 50 MG tablet Take 50-100 mg by mouth every 6 (six) hours as needed for moderate pain or severe pain.     No current facility-administered medications for this visit.     PHYSICAL EXAMINATION: ECOG PERFORMANCE STATUS: 1 - Symptomatic but completely ambulatory  . Vitals:   03/17/2017 1114  BP: 104/64  Pulse: (!) 155  Resp: 20  Temp: 98.4 F (36.9 C)  SpO2: 99%    Filed Weights   03/02/2017 1114  Weight: 233 lb 3.2 oz (105.8 kg)   .Body mass index is 44.06 kg/m.   GENERAL:alert, in no acute distress and comfortable SKIN: no acute rashes, no significant lesions EYES: rt eye swelling and slight proptosis and concern for rt occulomotor palsy. Diplopia in certain parts of visual field. OROPHARYNX: MMM, no exudates, no oropharyngeal erythema or ulceration NECK: supple, no JVD LYMPH:  no palpable lymphadenopathy in the cervical, axillary or inguinal regions LUNGS: clear to auscultation b/l with normal respiratory effort HEART: regular rate & rhythm ABDOMEN:  normoactive bowel sounds , non tender, not distended. Extremity: no pedal edema PSYCH: somewhat slowed responses and lethargic with oriented x 3 NEURO: moving all 4 extremity, concern for rt eye 3rd nerve palsy.   LABORATORY DATA:   I have reviewed the data as listed  . CBC Latest Ref Rng & Units 03/08/2017  03/21/2017 03/21/2017  WBC 3.9 - 10.3 K/uL 20.2(H) 1.8(L) 0.8(LL)  Hemoglobin 11.6 - 15.9 g/dL 9.5(L) 8.7(L) 7.8(L)  Hematocrit 34.8 - 46.6 % 28.9(L) 26.4(L) 24.5(L)  Platelets 145 - 400 K/uL 122(L) 33(L) 11(LL)  AC 16.7  . CMP Latest Ref Rng & Units 03/11/2017 03/21/2017 03/20/2017  Glucose 70 - 140 mg/dL 98 77 79  BUN 7 - 26 mg/dL 38(H) 12 15  Creatinine 0.60 - 1.10 mg/dL 2.48(H) 1.63(H) 1.69(H)  Sodium 136 - 145 mmol/L 139 143 143  Potassium 3.5 - 5.1 mmol/L 3.4(L) 3.5 3.3(L)  Chloride 98 - 109 mmol/L 100 107 106  CO2 22 - 29 mmol/L 24 27 27   Calcium 8.4 - 10.4 mg/dL 9.2 8.0(L) 8.0(L)  Total Protein 6.4 - 8.3 g/dL 5.8(L) - 4.9(L)  Total Bilirubin 0.2 - 1.2 mg/dL 1.0 - 0.6  Alkaline Phos 40 -  150 U/L 121 - 49  AST 5 - 34 U/L 45(H) - 50(H)  ALT 0 - 55 U/L 23 - 34     02/09/17 Flow Cytometry  02/09/17     RADIOGRAPHIC STUDIES: I have personally reviewed the radiological images as listed and agreed with the findings in the report.  Dg Chest Port 1 View  Result Date: 03/17/2017 CLINICAL DATA:  SOB today EXAM: PORTABLE CHEST 1 VIEW COMPARISON:  CT 02/06/2017 FINDINGS: Port in the anterior chest wall with tip in distal SVC. Normal cardiac silhouette. There is RIGHT pleural effusion and atelectasis. Upper lungs clear. No pneumothorax. IMPRESSION: 1. Persistent RIGHT pleural effusion and RIGHT basilar scarring. Chronic findings. 2. LEFT lung clear Electronically Signed   By: Suzy Bouchard M.D.   On: 03/17/2017 07:37   Dg Foot Complete Left  Result Date: 03/17/2017 CLINICAL DATA:  67 year old female with left foot pain for 1 month. Plantar and lateral aspect pain with no known injury. EXAM: LEFT FOOT - COMPLETE 3+ VIEW COMPARISON:  None. FINDINGS: Degenerative spurring of the plantar and dorsal calcaneus. Dorsal degenerative spurring throughout the tarsal bones, including dystrophic calcification or spurring along the medial anterior talus. Accessory ossicle adjacent to the cuboid. The  metatarsals are intact. There is mild joint space loss and degenerative subchondral sclerosis at the 1st MTP and 1st IP joints. There is chronic resorption of the head of the 5th proximal phalanx. The 5th middle and distal phalanges appear normal. No acute osseous abnormality identified. Generalized soft tissue swelling. No subcutaneous gas. IMPRESSION: Chronic and degenerative osseous changes throughout the left foot. No acute osseous abnormality identified. Generalized soft tissue swelling. Electronically Signed   By: Genevie Ann M.D.   On: 03/17/2017 15:40    ASSESSMENT & PLAN:   67 y.o. with GERD, morbid obesity, likely sleep apnea, smoker recently quit with  #1 Relapsed Refractory DLBCL - now with chest wall mass and increasing pelvic lymphadenopathy and progressively increasing LDH levels and new anemia and thrombocytopenia with  BM Mx showing extensive involvement with relapsed/refractory DLBCL and PET/CT with chest wall mass. 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  #4 s/p Severe thrombocytopenia PLT 25k BM involvement by lymphoma and chemotherapy - Platelets improved to 122k today.  #5 Acute renal failure --concern for Tumor lysis syndrome.  #6 RT occular swelling/diplopia and occulomotor palsy with increasing chin numbness--concerning for rt orbital lymphoma involvement and possible CNS involvement.  #7 Increased back pain  Previous L-Spine MRI shows lots of disk disease and nerve pinching but not lymphoma.   Plan: -labs and clinical status reviewed with the patient in details including concerns for lymphoma progression. -Add uric acid -- to evaluate for TLS given increased LDH and creatinine -IVF -Rasburicase if TLS  present -hold C3 of GCD today. -was scheduled for PET/CT to evaluate lymphoma status/response to treatment. -given concern for rt eye swelling and altered mental status - would recommend MRI brain including b/l orbits. -consider Lumbar puncture to evaluate for lymphoma involvement. --if refractory disease post C2 --will need to f/u with Dr Thera Flake for consideration of Car T-cell therapy. -if responding will continue bridging GCD with consideration of Auto HSCT at [facility]  ED today for concerns of CNS involvement with  lymphoma and ARF  . The total time spent in the appointment was 40 minutes and more than 50% was on counseling and direct patient cares.   Sullivan Lone MD [place] AAHIVMS [facility] Hematology/Oncology Physician [facility]  (Office):       [phone] (Work cell):  [phone] (Fax):           [phone]  This document serves as a record of services personally performed by Sullivan Lone, MD. It was created on his behalf by Margit Banda, a trained medical scribe. The creation of this record is based on the scribe's personal observations and the provider's statements to them.   .I have reviewed the above documentation for accuracy and completeness, and I agree with the above. Brunetta Genera MD MS

## 2017-03-30 NOTE — Progress Notes (Signed)
Patient discussed with primary team, from Dr Marlou Porch initial conversation plan for amiodarone tonight and cardiology to see tomorrow. Patient received only 150mg  of amio bolus at 530pm, does not appear drip was started after. Remains tachycardic, recommend repeat bolus 150mg  followed by infusion at 60mg /hr x 6 hrs then 30mg /hr x 18 hrs. Low bp's I think are more related to sepsis and the prior IV metoprolol and diltiazem she received as opposed to the rhythm. I would optimize her sepsis management with fluid resuscitation and if needed vasopressors prior to considering an emergent cardioversion (with risks of sedation and embolism), prior to IV av nodal agents her bp was fairly stable in this rhythm on presentation.    Carlyle Dolly MD

## 2017-03-30 NOTE — Progress Notes (Signed)
Pt transferred to ED post clinic visit today with c/o BLE pain 10/10, orbital edema, and double vision. Based on symptoms, MD concerned that pt may have progression of Multiple Myeloma. Report given to Tim, RN and pt taken to ED by this RN.

## 2017-03-30 NOTE — ED Provider Notes (Signed)
[facility] DEPT Provider Note   CSN: [id number] Arrival date & time: 03/03/2017  1232     History   Chief Complaint Chief Complaint  Patient presents with  . Diplopia  . Multiple Myeloma    HPI Jo Collins is a 67 y.o. female.  Patient is a 67 year old female with multiple medical problems including  paroxysmal atrial fibrillation not on anticoagulation, large B cell lymphoma, ongoing chemotherapy with recent tumor lysis syndrome resulting in acute renal failure, anemia and hospitalization, prior biopsy of the right iliac crest in January and ongoing issues with low back pain with radiation down to the left leg with weakness and numbness who was seen last week in the emergency department for complaints of leg pain was ultimately given steroids and pain medication.  Patient states she initially started experiencing double vision from her right eye, drooping in 1 week of feeling sleepy and having no energy that is not improved.  Patient saw her oncologist Dr. Claiborne Billings today who sent her down for further evaluation.  Patient does not think she has had a fever denies any nausea, vomiting or abdominal pain.  She continues to complain of pain in her left leg but family also notes within this last week she has had redness and swelling in her tailbone area.  She has had no urinary problems and denies palpitations, chest pain, shortness of breath or cough.  Patient stopped using hydrocodone thinking that is what was causing the diplopia but her double vision has not changed.  She states her vision from her left eye is completely normal.  She denies any unilateral weakness or numbness currently.  Family denies any altered mental status.  The history is provided by the patient.    Past Medical History:  Diagnosis Date  . Abdominal pain, RUQ   . Abnormal EKG 08/07/2016  . Acute midline thoracic back pain   . Acute respiratory distress 09/25/2016  . Anemia 08/07/2016  .  Chronic low back pain 09/25/2016  . Cough   . DLBCL (diffuse large B cell lymphoma) ([place]) 08/17/2016  . GERD (gastroesophageal reflux disease)   . History of chemotherapy   . Hypokalemia 09/25/2016  . 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 Lymphadenopathy   . Morbid obesity with BMI of 50.0-59.9, adult ([place])   . Neutrophilic leukocytosis 93/08/1827  . NHL (non-Hodgkin's lymphoma) ([place])   . Pleural effusion on right 08/07/2016  . Recurrent pleural effusion on right 10/08/2016  . Recurrent right pleural effusion 09/25/2016  . S/P thoracentesis   . Shortness of breath 08/07/2016  . SOB (shortness of breath) 09/26/2016    Patient Active Problem List   Diagnosis Date Noted  . 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 Foot pain, left   . Pancytopenia ([place]) 03/16/2017  . ARF (acute renal failure) ([place]) 03/16/2017  . Thrombocytopenia ([place]) 02/09/2017  . Left-sided low back pain with left-sided sciatica   . Abnormal CT of the abdomen   . Positive blood culture 02/07/2017  . Numbness 02/05/2017  . AF (paroxysmal atrial fibrillation) ([place]) 01/08/2017  . LVH (left ventricular hypertrophy) 01/08/2017  . S/P thoracentesis   . Cough   . Neutrophilic leukocytosis 93/71/6967  . Recurrent pleural effusion on right 10/08/2016  . SOB (shortness of breath) 09/26/2016  . Acute respiratory distress 09/25/2016  . Chronic low back pain 09/25/2016  . Hypokalemia 09/25/2016  . Recurrent right pleural effusion 09/25/2016  . Diffuse large B cell lymphoma ([place]) 08/17/2016  .  Acute midline thoracic back pain   . Lymphadenopathy   . Morbid obesity with BMI of 50.0-59.9, adult ([place])   . NHL (non-Hodgkin's lymphoma) ([place])   . Abdominal pain, RUQ   . Pleural effusion on right 08/07/2016  . Abnormal EKG 08/07/2016  . 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 Anemia 08/07/2016  . GERD (gastroesophageal reflux disease) 08/07/2016  . Shortness of breath 08/07/2016    Past Surgical History:  Procedure  Laterality Date  . ABDOMINAL HYSTERECTOMY    . IR CV LINE INJECTION  10/26/2016  . IR FLUORO GUIDE PORT INSERTION RIGHT  08/18/2016  . IR FLUORO GUIDE PORT INSERTION RIGHT  11/03/2016  . IR REMOVAL TUN CV CATH W/O FL  11/03/2016  . IR THORACENTESIS ASP PLEURAL SPACE W/IMG GUIDE  08/07/2016  . IR THORACENTESIS ASP PLEURAL SPACE W/IMG GUIDE  08/14/2016  . IR US GUIDE VASC ACCESS RIGHT  08/18/2016  . IR US GUIDE VASC ACCESS RIGHT  11/03/2016     OB History   None      Home Medications    Prior to Admission medications   Medication Sig Start Date End Date Taking? Authorizing Provider  acetaminophen (TYLENOL) 500 MG tablet Take 1,000 mg by mouth every 6 (six) hours as needed for moderate pain.    [provider]  aspirin EC 81 MG tablet Take 1 tablet (81 mg total) by mouth daily. 01/08/17   Fay Records, MD  dexamethasone (DECADRON) 4 MG tablet 76m po daily with breakfast from D1 to D4 each cycle of chemotherapy 03/09/17   KBrunetta Genera MD  furosemide (LASIX) 20 MG tablet Take 1 tablet (20 mg total) by mouth daily. 03/09/17   KBrunetta Genera MD  gabapentin (NEURONTIN) 300 MG capsule 3055mpo with breakfast and lunch and 60060mt bedtime. 03/09/17   KalBrunetta GeneraD  HYDROcodone-acetaminophen (NORCO) 5-325 MG tablet Take 1 tablet by mouth every 6 (six) hours as needed for severe pain. 03/25/17   Street, MerFlaxtonA-C  predniSONE (DELTASONE) 20 MG tablet 3 tabs po daily x 5 days STARTING ON 03/26/17 03/25/17   Street, MerWeldonA-C  traMADol (ULTRAM) 50 MG tablet Take 50-100 mg by mouth every 6 (six) hours as needed for moderate pain or severe pain.    [provider]    Family History Family History  Problem Relation Age of Onset  . COPD Mother   . Diabetes Mellitus II Sister   . Diabetes Mellitus II Brother   . Diabetes Mellitus II Sister   . Stroke Sister   . Hypertension Sister   . Diabetes Mellitus II Sister     Social History Social History    Tobacco Use  . Smoking status: Former Smoker    Packs/day: 0.50    Years: 47.00    Pack years: 23.50    Types: Cigarettes    Last attempt to quit: 07/28/2016    Years since quitting: 0.6  . Smokeless tobacco: Never Used  Substance Use Topics  . Alcohol use: No  . Drug use: No     Allergies   Patient has no known allergies.   Review of Systems Review of Systems  All other systems reviewed and are negative.    Physical Exam Updated Vital Signs BP 105/67 (BP Location: Right Arm)   Pulse (!) 158   Resp (!) 21   SpO2 100%   Physical Exam  Constitutional: She is oriented to person, place, and time. She appears well-developed and  well-nourished. No distress.  Morbidly obese  HENT:  Head: Normocephalic and atraumatic.  Mouth/Throat: Oropharynx is clear and moist.  Eyes: Pupils are equal, round, and reactive to light. Conjunctivae and EOM are normal.  Neck: Normal range of motion. Neck supple.  Cardiovascular: Regular rhythm and intact distal pulses. Tachycardia present.  No murmur heard. Pulmonary/Chest: Effort normal and breath sounds normal. No respiratory distress. She has no wheezes. She has no rales.  Abdominal: Soft. She exhibits no distension. There is no tenderness. There is no rebound and no guarding.  Musculoskeletal: Normal range of motion. She exhibits no edema or tenderness.       Back:  Neurological: She is alert and oriented to person, place, and time. She has normal strength. A cranial nerve deficit is present. No sensory deficit.  Minimal drooping of the right eyelid.  Unable to move her right eye medially when extraocular movements evaluated.  Left eye extraocular movements are intact.  Vision is intact.  Visual fields are intact bilaterally.  No other cranial nerve deficits noted.  No pronator drift.  Skin: Skin is warm and dry. No rash noted. No erythema.  Psychiatric: She has a normal mood and affect. Her behavior is normal.  Nursing note and vitals  reviewed.    ED Treatments / Results  Labs (all labs ordered are listed, but only abnormal results are displayed) Labs Reviewed  COMPREHENSIVE METABOLIC PANEL - Abnormal; Notable for the following components:      Result Value   CO2 21 (*)    BUN 38 (*)    Creatinine, Ser 2.39 (*)    Total Protein 5.5 (*)    Albumin 2.8 (*)    AST 47 (*)    GFR calc non Af Amer 20 (*)    GFR calc Af Amer 23 (*)    Anion gap 17 (*)    All other components within normal limits  CBC WITH DIFFERENTIAL/PLATELET - Abnormal; Notable for the following components:   WBC 18.5 (*)    RBC 3.52 (*)    Hemoglobin 9.9 (*)    HCT 30.2 (*)    RDW 17.4 (*)    Platelets 102 (*)    nRBC 2 (*)    Neutro Abs 14.0 (*)    Monocytes Absolute 1.5 (*)    All other components within normal limits  I-STAT CG4 LACTIC ACID, ED - Abnormal; Notable for the following components:   Lactic Acid, Venous 3.00 (*)    All other components within normal limits  I-STAT CG4 LACTIC ACID, ED - Abnormal; Notable for the following components:   Lactic Acid, Venous 2.60 (*)    All other components within normal limits  CULTURE, BLOOD (ROUTINE X 2)  CULTURE, BLOOD (ROUTINE X 2)  URINALYSIS, ROUTINE W REFLEX MICROSCOPIC    EKG EKG Interpretation  Date/Time:  Friday March 30 2017 12:55:52 EDT Ventricular Rate:  157 PR Interval:    QRS Duration: 105 QT Interval:  277 QTC Calculation: 450 R Axis:   60 Text Interpretation:  Atrial flutter Low voltage, precordial leads Anteroseptal infarct, old Nonspecific T abnormalities, inferior leads No significant change since last tracing Confirmed by Blanchie Dessert [phone]) on 03/07/2017 1:21:15 PM   Radiology Ct Head Wo Contrast  Result Date: 04/01/2017 CLINICAL DATA:  Double vision. EXAM: CT HEAD WITHOUT CONTRAST TECHNIQUE: Contiguous axial images were obtained from the base of the skull through the vertex without intravenous contrast. COMPARISON:  MRI of February 06, 2017. FINDINGS:  Brain: Mild  chronic ischemic white matter disease is noted. No mass effect or midline shift is noted. Ventricular size is within normal limits. There is no evidence of mass lesion, hemorrhage or acute infarction. Vascular: No hyperdense vessel or unexpected calcification. Skull: Normal. Negative for fracture or focal lesion. Sinuses/Orbits: No acute finding. Other: None. IMPRESSION: Mild chronic ischemic white matter disease. No acute intracranial abnormality seen. Electronically Signed   By: Marijo Conception, M.D.   On: 03/24/2017 15:01   Dg Chest Port 1 View  Result Date: 03/31/2017 CLINICAL DATA:  Tachycardia EXAM: PORTABLE CHEST 1 VIEW COMPARISON:  March 17, 2017 FINDINGS: Port-A-Cath tip is in the superior vena cava. No pneumothorax. There is airspace consolidation in the right lower lobe with small right pleural effusion. Left lung is clear. Heart is borderline enlarged with pulmonary vascularity within normal limits. No adenopathy. No bone lesions. IMPRESSION: Airspace consolidation consistent with pneumonia right lower lung zone with small right pleural effusion. Left lung clear. Stable cardiac silhouette. Port-A-Cath tip in superior vena cava. No pneumothorax. Electronically Signed   By: Lowella Grip III M.D.   On: 03/02/2017 14:06    Procedures Procedures (including critical care time)  INCISION AND DRAINAGE Performed by: Blanchie Dessert Consent: Verbal consent obtained. Risks and benefits: risks, benefits and alternatives were discussed Type: abscess  Body area: left gluteal cleft  Anesthesia: local infiltration  Incision was made with a scalpel.  Local anesthetic: lidocaine 2% with epinephrine  Anesthetic total: 4 ml  Complexity: complex Blunt dissection to break up loculations  Drainage: no drainage other than blood  Drainage amount: 0  Packing material: none Patient tolerance: Patient tolerated the procedure well with no immediate  complications.     Medications Ordered in ED Medications  sodium chloride 0.9 % bolus 2,000 mL (has no administration in time range)     Initial Impression / Assessment and Plan / ED Course  I have reviewed the triage vital signs and the nursing notes.  Pertinent labs & imaging results that were available during my care of the patient were reviewed by me and considered in my medical decision making (see chart for details).     Patient with multiple medical problems presenting today with evidence of atrial flutter, soft blood pressures, diplopia for about a week and symptoms concerning for possible sepsis.  Patient states her diplopia started after getting hydrocodone from her last emergency room visit when she was complaining of more radicular type symptoms.  Patient has recently had an MRI which showed severe canal stenosis but no other acute findings of the lumbar spine.  For the last week she has been very tired and has had no energy.  She is unaware she has had a fever but denies any respiratory symptoms.  Patient is asymptomatic from the atrial flutter.  She does have an area that appears to be cellulitis and an abscess over her gluteal cleft concerning for a pilonidal abscess.  Also patient with a diplopia and history of non-Hodgkin's lymphoma concern for brain metastasis however also could be related to strokelike symptoms. Patient is afebrile here however will start sepsis protocol for further information.  Head CT pending.  Initially will hydrate with IV fluids and if that does not improve her heart rate will give her a rate controlling medication.  3:37 PM She is chest x-ray showing a right-sided infiltrate with pleural effusion and concerning for pneumonia which would be healthcare associated.  Patient's head CT is negative.  Patient's labs are consistent with  a leukocytosis of 18,000 however family states she recently got a Neulasta shot within the last week after chemotherapy.   Patient has acute renal failure today with a creatinine of 2.39 from her baseline of 1.5 and an anion gap of 17.  Patient's heart rate did not respond to fluids.  Initially given an IV bolus of metoprolol without improvement.  Will start on a Cardizem drip.  I indeed the area of question in the pilonidal area without pus drainage.  Will discuss with neurology about the 3rd nerve palsy.  Patient on reevaluation said she has had a slight headache at the most and has very minimal temporal artery tenderness.  No signs of Horner's syndrome.  3:40 PM Repeat lactate elevated at 2.6 and pt given a 3rd liter of fluid.  4:22 PM Spoke with neurology who recommended when patient is stable enough to do MR, MRA, MRV to further evaluate.  Lower suspicion the patient's symptoms are related to an aneurysm as she has no pupillary involvement.  At this time patient is still tachycardic and working on heart rate control and did not feel she is stable for MRI.  Will admit to the hospitalist service.  Patient remains neurologically intact.  After I&D of the pilonidal area there was no pus drainage.  CRITICAL CARE Performed by: Ellisyn Icenhower Total critical care time: 40 minutes Critical care time was exclusive of separately billable procedures and treating other patients. Critical care was necessary to treat or prevent imminent or life-threatening deterioration. Critical care was time spent personally by me on the following activities: development of treatment plan with patient and/or surrogate as well as nursing, discussions with consultants, evaluation of patient's response to treatment, examination of patient, obtaining history from patient or surrogate, ordering and performing treatments and interventions, ordering and review of laboratory studies, ordering and review of radiographic studies, pulse oximetry and re-evaluation of patient's condition.  Final Clinical Impressions(s) / ED Diagnoses   Final diagnoses:   Atrial flutter, unspecified type ([place])  HCAP (healthcare-associated pneumonia)  Sepsis, due to unspecified organism [facility])  3rd nerve palsy, complete, right  AKI (acute kidney injury) [facility])    ED Discharge Orders    None       Blanchie Dessert, MD 03/28/2017 1623

## 2017-03-30 NOTE — Progress Notes (Signed)
Pharmacy Antibiotic Note  Jo Collins is a 67 y.o. female admitted on 03/27/2017 with HCAP, sepsis, cellulitis and an abscess over her gluteal cleft concerning for a pilonidal abscess s/p bedside I&D in ED 3/29.  Pharmacy has been consulted for vancomycin and cefepime dosing. Vancomycin 1 gm given at 1502, cefepime 2 gm given at 1434  Plan: Vancomycin 1250 mg in addition to vancomycin 1000 mg for total loading dose 2250 mg then vancomycin 750 mg IV q48 for est AUC 432 using SCr 2.4 and IBW/ABW.  Cefepime 2 gm today then cefepime 1 gm IV q24 F/u renal function, WBC, temp, culture data Vancomycin levels as needed   Temp (24hrs), Avg:98.4 F (36.9 C), Min:98.4 F (36.9 C), Max:98.4 F (36.9 C)  Recent Labs  Lab 03/02/2017 1038 03/29/2017 1309 04/01/2017 1335 03/26/2017 1523  WBC 20.2* 18.5*  --   --   CREATININE 2.48* 2.39*  --   --   LATICACIDVEN  --   --  3.00* 2.60*    Estimated Creatinine Clearance: 26 mL/min (A) (by C-G formula based on SCr of 2.39 mg/dL (H)).    No Known Allergies  Antimicrobials this admission: vanc 3/29>> Cefepime 3/29>> Dose adjustments this admission:  Microbiology results: 3/29 BCx2>>   Thank you for allowing pharmacy to be a part of this patient's care.  Eudelia Bunch, Pharm.D. [phone] 03/11/2017 5:23 PM

## 2017-03-30 NOTE — Telephone Encounter (Signed)
Called [facility] lab to determine if uric acid could be added. Able to add to CMP drawn today. Order placed. Verdis Frederickson, Lab Technician aware of order.

## 2017-03-30 NOTE — ED Triage Notes (Signed)
She reports having "double-vision" since this Sunday. She also c/o chronic bilat. Leg pain. She is awake, slightly drowsy and oriented x 4 with clear speech. Her sister is with her.

## 2017-03-30 NOTE — ED Notes (Signed)
Bed: VW97 Expected date:  Expected time:  Means of arrival:  Comments: [facility]

## 2017-03-30 NOTE — Consult Note (Signed)
NEURO HOSPITALIST CONSULT NOTE   Requestig physician: Dr. 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 She states the diplopia began on Monday and has worsened since then. In conjunction, she has had a one week history of drowsiness, lethargy and malaise. She also states that she has been "dragging my left leg" since a right iliac crest biopsy in January.   Of note, she is not on anticoagulation currently for her a-fib. She had a recent tumor lysis syndrome from ongoing chemotherapy, resulting in acute renal failure, anemia and prior hospitalization.   Denies fever, nausea or vomiting. Endorses continuing left leg pain with weakness. No CP, SOB or cough.   Diplopia initially discussed with Neurohospitalist day team, with recommendation for MRI brain, MRA head and MRV head when stable.   Lab review: BUN/Cr 38/2.39 with eGFR of 23. LDH elevated at 2726. Lactate 2.6. WBC 18.5. Recent TSH normal at 1.474.   CT head obtained today reveals mild chronic ischemic white matter disease, with no acute intracranial abnormality seen.  MRI lumbar spine Feb 08, 2017: Spondylosis worst at L4-5 where there is severe central canal stenosis and mild to moderate foraminal narrowing, worse on the right. Advanced facet degenerative disease at this level results in 0.3 cm anterolisthesis. Right worse than left facet arthropathy at L5-S1 with an associated right facet joint effusion. Facet arthropathy and disc cause mild right  foraminal narrowing at this level. The central canal and left foramen are open. Somewhat decreased T1 signal in all imaged bones could be related to the patient's history of lymphoma or obesity.  MRI thoracic spine August 10, 2016: Large paraspinal mass as seen on prior CT scan. Abnormal marrow signal from T7-T11 is most conspicuous in T8 and T9 and consistent with tumor. 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 Epidural tumor is best seen on axial images 14-21 of series 12. Right pleural effusion.  Multilevel cervical and thoracic spondylosis.  Past Medical History:  Diagnosis Date  . Abdominal pain, RUQ   . Abnormal EKG 08/07/2016  . Acute midline thoracic back pain   . Acute respiratory distress 09/25/2016  . Anemia  08/07/2016  . Chronic low back pain 09/25/2016  . Cough   . DLBCL (diffuse large B cell lymphoma) ([place]) 08/17/2016  . GERD (gastroesophageal reflux disease)   . History of chemotherapy   . Hypokalemia 09/25/2016  . 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 Lymphadenopathy   . Morbid obesity with BMI of 50.0-59.9, adult ([place])   . Neutrophilic leukocytosis 69/04/8544  . NHL (non-Hodgkin's lymphoma) ([place])   . Pleural effusion on right 08/07/2016  . Recurrent pleural effusion on right 10/08/2016  . Recurrent right pleural effusion 09/25/2016  . S/P thoracentesis   . Shortness of breath 08/07/2016  . SOB (shortness of breath) 09/26/2016    Past Surgical History:  Procedure Laterality Date  . ABDOMINAL HYSTERECTOMY    . IR CV LINE INJECTION  10/26/2016  . IR FLUORO GUIDE PORT INSERTION RIGHT  08/18/2016  . IR FLUORO GUIDE PORT INSERTION RIGHT  11/03/2016  . IR REMOVAL TUN CV CATH W/O FL  11/03/2016  . IR THORACENTESIS ASP PLEURAL SPACE W/IMG GUIDE  08/07/2016  . IR THORACENTESIS ASP PLEURAL SPACE W/IMG GUIDE  08/14/2016  . IR US GUIDE VASC ACCESS RIGHT  08/18/2016  . IR US GUIDE VASC ACCESS RIGHT  11/03/2016    Family History   Problem Relation Age of Onset  . COPD Mother   . Diabetes Mellitus II Sister   . Diabetes Mellitus II Brother   . Diabetes Mellitus II Sister   . Stroke Sister   . Hypertension Sister   . Diabetes Mellitus II Sister             Social History:  reports that she quit smoking about 8 months ago. Her smoking use included cigarettes. She has a 23.50 pack-year smoking history. She has never used smokeless tobacco. She reports that she does not drink alcohol or use drugs.  No Known Allergies  MEDICATIONS:                                                                                                                     Prior to Admission:  Medications Prior to Admission  Medication Sig Dispense Refill Last Dose  . acetaminophen (TYLENOL) 500 MG tablet Take 1,000 mg by mouth every 6 (six) hours as needed for moderate pain.   03/06/2017 at Unknown time  . dexamethasone (DECADRON) 4 MG tablet 33m po daily with breakfast from D1 to D4 each cycle of chemotherapy 40 tablet 2 Past Month at Unknown time  . gabapentin (NEURONTIN) 300 MG capsule 303mpo with breakfast and lunch and 60056mt bedtime. 120 capsule 0 04/01/2017 at Unknown time  . HYDROcodone-acetaminophen (NORCO) 5-325 MG tablet Take 1 tablet by mouth every 6 (six) hours as needed for severe pain. 20 tablet 0 Past Week at Unknown time  . predniSONE (DELTASONE) 10 MG tablet 6 TABLETS BY MOUTH DAILY FOR 5 DAYS STARTING 03/26/17  0 03/29/2017 at Unknown time  . traMADol (ULTRAM) 50 MG tablet Take 50-100 mg by mouth every 6 (six) hours as  needed for moderate pain or severe pain.   03/29/2017 at Unknown time  . aspirin EC 81 MG tablet Take 1 tablet (81 mg total) by mouth daily. (Patient not taking: Reported on 04/01/2017) 90 tablet 3 Not Taking at Unknown time  . furosemide (LASIX) 20 MG tablet Take 1 tablet (20 mg total) by mouth daily. (Patient not taking: Reported on 03/16/2017) 30 tablet 1 Not Taking at Unknown time  . predniSONE (DELTASONE) 20 MG  tablet 3 tabs po daily x 5 days STARTING ON 03/26/17 (Patient not taking: Reported on 03/03/2017) 15 tablet 0 Not Taking at Unknown time   Scheduled:  Continuous: . sodium chloride 100 mL/hr at 03/26/2017 1714  . amiodarone 60 mg/hr (03/11/2017 2000)   Followed by  . [START ON 03/31/2017] amiodarone    . [START ON 03/31/2017] ceFEPime (MAXIPIME) IV    . heparin 1,050 Units/hr (03/15/2017 1942)  . [START ON 04/01/2017] vancomycin      ROS:                                                                                                                                       As per HPI.   Blood pressure (!) 85/56, pulse (!) 155, temperature 98.6 F (37 C), temperature source Oral, resp. rate (!) 23, height _0  (1.549 m), weight 115.5 kg (254 lb 10.1 oz), SpO2 95 %.  General Examination:                                                                                                      General: Morbid obesity HEENT-  [place]/AT   Lungs- Respirations unlabored Extremities- No pallor or cyanosis. Warm to touch.   Neurological Examination Mental Status: Drowsy. Oriented to city, state, month and self but not day or year. Speech fluent with intact comprehension, but does require repetition of some verbal commands in the context of poor attention. Sedated affect.  Cranial Nerves: II: Visual fields intact without field cut. Poor eye contact in the context of sedated affect with poor attention. PERRL without mydriasis or miosis.  III,IV, VI: Severe right ptosis on right. Left eyelid normal strength.  Right eye able to abduct, with impaired infraduction, supraduction and adduction.  Left EOMI.  No nystagmus.  V,VII: smile symmetric, facial temp sensation normal bilaterally in V1-3 VIII: hearing intact to voice IX,X: Palate rises symmetrically XI: Head midline XII: midline tongue extension Motor: RUE: 4+/5 proximal and distal  LUE: 4+/5 proximal and distal RLE: 4+/5  proximal, 5/5 distal LLE: 4/5 hip  flexion, knee flexion. 4-/5 knee extension. Unable to plantar or dorsiflex left foot or wiggle toes on the left. Sensory: Temp sensation subjectively intact proximally all 4 extremities on initial attempt. More detailed testing reveals patchy and inconsistent responses regarding temp sensation in bilateral lower ext. FT intact all 4 ext.  Deep Tendon Reflexes: 1+ brachioradialis bilaterally. 0 patellae and achilles bilaterally. Toes mute bilaterally. Cerebellar: Slow FNF on left. Unable to cooperate on right due to fatigue. Unable to perform heel-shin due to compliance/fatigue/inatttention.  Gait: Unable to assess   Lab Results: Basic Metabolic Panel: Recent Labs  Lab 03/22/2017 1038 03/23/2017 1309  NA 139 141  K 3.4* 3.6  CL 100 103  CO2 24 21*  GLUCOSE 98 89  BUN 38* 38*  CREATININE 2.48* 2.39*  CALCIUM 9.2 8.9    CBC: Recent Labs  Lab 03/05/2017 1038 03/19/2017 1309  WBC 20.2* 18.5*  NEUTROABS 16.7* 14.0*  HGB 9.5* 9.9*  HCT 28.9* 30.2*  MCV 85.0 85.8  PLT 122* 102*    Cardiac Enzymes: No results for input(s): CKTOTAL, CKMB, CKMBINDEX, TROPONINI in the last 168 hours.  Lipid Panel: No results for input(s): CHOL, TRIG, HDL, CHOLHDL, VLDL, LDLCALC in the last 168 hours.  Imaging: Ct Head Wo Contrast  Result Date: 03/13/2017 CLINICAL DATA:  Double vision. EXAM: CT HEAD WITHOUT CONTRAST TECHNIQUE: Contiguous axial images were obtained from the base of the skull through the vertex without intravenous contrast. COMPARISON:  MRI of February 06, 2017. FINDINGS: Brain: Mild chronic ischemic white matter disease is noted. No mass effect or midline shift is noted. Ventricular size is within normal limits. There is no evidence of mass lesion, hemorrhage or acute infarction. Vascular: No hyperdense vessel or unexpected calcification. Skull: Normal. Negative for fracture or focal lesion. Sinuses/Orbits: No acute finding. Other: None. IMPRESSION: Mild chronic ischemic white matter disease. No  acute intracranial abnormality seen. Electronically Signed   By: Marijo Conception, M.D.   On: 03/24/2017 15:01   Dg Chest Port 1 View  Result Date: 03/02/2017 CLINICAL DATA:  Tachycardia EXAM: PORTABLE CHEST 1 VIEW COMPARISON:  March 17, 2017 FINDINGS: Port-A-Cath tip is in the superior vena cava. No pneumothorax. There is airspace consolidation in the right lower lobe with small right pleural effusion. Left lung is clear. Heart is borderline enlarged with pulmonary vascularity within normal limits. No adenopathy. No bone lesions. IMPRESSION: Airspace consolidation consistent with pneumonia right lower lung zone with small right pleural effusion. Left lung clear. Stable cardiac silhouette. Port-A-Cath tip in superior vena cava. No pneumothorax. Electronically Signed   By: Lowella Grip III M.D.   On: 03/29/2017 14:06   Assessment: 67 year old female with lymphoma on chemotherapy, presenting with multiple comorbidities and right CN3 palsy since Monday 1. Right CN3 palsy. Pupil sparing, suggesting that this is not due to external compression or infiltration by lymphoma. 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 MRI should be useful to rule out mass effect or gross encasement of CN3 by lymphomatous infiltrate, but could miss subtle infiltration/encasement as it must be done without contrast due to patient's impaired renal function.  2. Diffuse LLE weakness with severe left foot drop and weakness of foot plantar flexion. Most likely secondary to compromise of nerve roots supplying the LLE within the cauda equina, versus a left lumbosacral plexopathy. MRI lumbar spine obtained in Feb revealed no nerve root compression at the level of the foramina.  Lack of contrast on prior L-spine MRI noted, and lymphomatous infiltration of nerve roots could easily be missed.  3. Lab review: BUN/Cr 38/2.39 with eGFR of 23. LDH elevated at 2726. Lactate 2.6. WBC 18.5. Recent TSH normal  at 1.474.  4. CT head obtained today reveals mild chronic ischemic white matter disease, with no acute intracranial abnormality seen.  Recommendations: 1. MRI brain and orbits to assess for possible lymphomatous infiltrate in the region of right CN3. Unfortunately, cannot be performed with contrast due to compromised renal function.  2. MRA head to assess for possible aneurysm with mass effect on right CN3.  3. MRV to assess for possible venous sinus thrombosis.  4. Will need lumbar puncture to assess for possible CNS lymphoma. Most important labs to obtain would be flow cytometry and cytology, each requiring at least 5 cc of fresh CSF sample. These tests must be performed within 3 hours of LP in order to avoid sample degradation, which could result in a false negative. LP will need to be scheduled in the AM on a weekday to allow enough time for sample to be run during normal pathology shift lab hours. Also will need cell count with differential, protein, glucose, bacterial and fungal stain, bacterial and fungal culture. Will need fluoro guided LP due to patient's morbid obesity and spondylosis seen on recent MRI L-spine.  5. Neurology team will follow with you.   Electronically signed: Dr. Kerney Elbe 03/26/2017, 8:43 PM

## 2017-03-30 NOTE — ED Notes (Signed)
Dr Maryan Rued notified of patient's lactic result.

## 2017-03-30 NOTE — Progress Notes (Signed)
ANTICOAGULATION CONSULT NOTE - Initial Consult  Pharmacy Consult for heparin Indication: atrial fibrillation  No Known Allergies  Patient Measurements:   Heparin Dosing Weight: 73.6 kg  Vital Signs: Temp: 98.4 F (36.9 C) (03/29 1114) Temp Source: Oral (03/29 1114) BP: 107/86 (03/29 1630) Pulse Rate: 154 (03/29 1615)  Labs: Recent Labs    03/29/2017 1038 03/05/2017 1309  HGB 9.5* 9.9*  HCT 28.9* 30.2*  PLT 122* 102*  CREATININE 2.48* 2.39*    Estimated Creatinine Clearance: 26 mL/min (A) (by C-G formula based on SCr of 2.39 mg/dL (H)).   Medical History: Past Medical History:  Diagnosis Date  . Abdominal pain, RUQ   . Abnormal EKG 08/07/2016  . Acute midline thoracic back pain   . Acute respiratory distress 09/25/2016  . Anemia 08/07/2016  . Chronic low back pain 09/25/2016  . Cough   . DLBCL (diffuse large B cell lymphoma) ([place]) 08/17/2016  . GERD (gastroesophageal reflux disease)   . History of chemotherapy   . Hypokalemia 09/25/2016  . 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 Lymphadenopathy   . Morbid obesity with BMI of 50.0-59.9, adult ([place])   . Neutrophilic leukocytosis 58/05/2776  . NHL (non-Hodgkin's lymphoma) ([place])   . Pleural effusion on right 08/07/2016  . Recurrent pleural effusion on right 10/08/2016  . Recurrent right pleural effusion 09/25/2016  . S/P thoracentesis   . Shortness of breath 08/07/2016  . SOB (shortness of breath) 09/26/2016    Assessment: 67 yo F with rapid afib.  Pharmacy consulted to dose heparin.  No anticoagulants PTA.  Wt 105.8, HDW 73.6 kg. Hg 9.9, pltc 102.  Cr 2.39.   Goal of Therapy:  Heparin level 0.3-0.7 units/ml Monitor platelets by anticoagulation protocol: Yes   Plan:  Give 4000 units bolus x 1 Start heparin infusion at 1050 units/hr Check anti-Xa level in 8 hours and daily while on heparin Continue to monitor H&H and platelets  Eudelia Bunch, Pharm.D. [phone] 03/27/2017 5:12 PM

## 2017-03-30 NOTE — ED Notes (Signed)
RN AND MD NOTIFIED OF PATIENT'S LACTIC ACID LEVEL OF 3.00

## 2017-03-30 NOTE — Telephone Encounter (Signed)
Per 3/29 no los to be added ED today for concerns of CNS involvement with lymphoma

## 2017-03-31 ENCOUNTER — Other Ambulatory Visit: Payer: Self-pay

## 2017-03-31 ENCOUNTER — Inpatient Hospital Stay (HOSPITAL_COMMUNITY): Payer: Medicare Other

## 2017-03-31 ENCOUNTER — Encounter (HOSPITAL_COMMUNITY): Payer: Self-pay | Admitting: *Deleted

## 2017-03-31 DIAGNOSIS — I483 Typical atrial flutter: Secondary | ICD-10-CM

## 2017-03-31 DIAGNOSIS — Z6841 Body Mass Index (BMI) 40.0 and over, adult: Secondary | ICD-10-CM

## 2017-03-31 DIAGNOSIS — A419 Sepsis, unspecified organism: Principal | ICD-10-CM

## 2017-03-31 DIAGNOSIS — C8330 Diffuse large B-cell lymphoma, unspecified site: Secondary | ICD-10-CM

## 2017-03-31 DIAGNOSIS — N189 Chronic kidney disease, unspecified: Secondary | ICD-10-CM

## 2017-03-31 DIAGNOSIS — N179 Acute kidney failure, unspecified: Secondary | ICD-10-CM

## 2017-03-31 LAB — URINALYSIS, ROUTINE W REFLEX MICROSCOPIC
BILIRUBIN URINE: NEGATIVE
Glucose, UA: NEGATIVE mg/dL
Ketones, ur: 5 mg/dL — AB
Nitrite: NEGATIVE
PROTEIN: NEGATIVE mg/dL
Specific Gravity, Urine: 1.02 (ref 1.005–1.030)
pH: 5 (ref 5.0–8.0)

## 2017-03-31 LAB — CBC
HEMATOCRIT: 25.5 % — AB (ref 36.0–46.0)
Hemoglobin: 8.5 g/dL — ABNORMAL LOW (ref 12.0–15.0)
MCH: 28.3 pg (ref 26.0–34.0)
MCHC: 33.3 g/dL (ref 30.0–36.0)
MCV: 85 fL (ref 78.0–100.0)
Platelets: 124 10*3/uL — ABNORMAL LOW (ref 150–400)
RBC: 3 MIL/uL — ABNORMAL LOW (ref 3.87–5.11)
RDW: 17.9 % — AB (ref 11.5–15.5)
WBC: 18.9 10*3/uL — AB (ref 4.0–10.5)

## 2017-03-31 LAB — COMPREHENSIVE METABOLIC PANEL
ALT: 25 U/L (ref 14–54)
ANION GAP: 12 (ref 5–15)
AST: 51 U/L — AB (ref 15–41)
Albumin: 2.4 g/dL — ABNORMAL LOW (ref 3.5–5.0)
Alkaline Phosphatase: 96 U/L (ref 38–126)
BILIRUBIN TOTAL: 0.8 mg/dL (ref 0.3–1.2)
BUN: 30 mg/dL — AB (ref 6–20)
CHLORIDE: 107 mmol/L (ref 101–111)
CO2: 22 mmol/L (ref 22–32)
Calcium: 8.3 mg/dL — ABNORMAL LOW (ref 8.9–10.3)
Creatinine, Ser: 2.22 mg/dL — ABNORMAL HIGH (ref 0.44–1.00)
GFR, EST AFRICAN AMERICAN: 25 mL/min — AB (ref >60)
GFR, EST NON AFRICAN AMERICAN: 22 mL/min — AB (ref >60)
Glucose, Bld: 87 mg/dL (ref 65–99)
POTASSIUM: 3.1 mmol/L — AB (ref 3.5–5.1)
Sodium: 141 mmol/L (ref 135–145)
TOTAL PROTEIN: 4.8 g/dL — AB (ref 6.5–8.1)

## 2017-03-31 LAB — HEPARIN LEVEL (UNFRACTIONATED)
HEPARIN UNFRACTIONATED: 0.26 [IU]/mL — AB (ref 0.30–0.70)
Heparin Unfractionated: 0.44 IU/mL (ref 0.30–0.70)
Heparin Unfractionated: 0.4 IU/mL (ref 0.30–0.70)

## 2017-03-31 LAB — SODIUM, URINE, RANDOM: SODIUM UR: 17 mmol/L

## 2017-03-31 LAB — LACTIC ACID, PLASMA: LACTIC ACID, VENOUS: 3.1 mmol/L — AB (ref 0.5–1.9)

## 2017-03-31 LAB — MAGNESIUM: MAGNESIUM: 2.2 mg/dL (ref 1.7–2.4)

## 2017-03-31 LAB — CREATININE, URINE, RANDOM: Creatinine, Urine: 176.39 mg/dL

## 2017-03-31 MED ORDER — HEPARIN (PORCINE) IN NACL 100-0.45 UNIT/ML-% IJ SOLN
1200.0000 [IU]/h | INTRAMUSCULAR | Status: AC
  Administered 2017-03-31 – 2017-04-03 (×3): 1200 [IU]/h via INTRAVENOUS
  Filled 2017-03-31 (×4): qty 250

## 2017-03-31 MED ORDER — TRAZODONE HCL 50 MG PO TABS
50.0000 mg | ORAL_TABLET | Freq: Every evening | ORAL | Status: DC | PRN
  Administered 2017-03-31 – 2017-04-11 (×8): 50 mg via ORAL
  Filled 2017-03-31 (×8): qty 1

## 2017-03-31 MED ORDER — MAGNESIUM SULFATE 2 GM/50ML IV SOLN
2.0000 g | Freq: Once | INTRAVENOUS | Status: AC
  Administered 2017-03-31: 2 g via INTRAVENOUS
  Filled 2017-03-31: qty 50

## 2017-03-31 MED ORDER — POTASSIUM CHLORIDE CRYS ER 20 MEQ PO TBCR
40.0000 meq | EXTENDED_RELEASE_TABLET | ORAL | Status: AC
  Administered 2017-03-31 (×2): 40 meq via ORAL
  Filled 2017-03-31 (×2): qty 2

## 2017-03-31 MED ORDER — VANCOMYCIN HCL IN DEXTROSE 1-5 GM/200ML-% IV SOLN
1000.0000 mg | INTRAVENOUS | Status: DC
  Administered 2017-04-01 – 2017-04-03 (×2): 1000 mg via INTRAVENOUS
  Filled 2017-03-31 (×3): qty 200

## 2017-03-31 MED ORDER — SODIUM CHLORIDE 0.9% FLUSH
10.0000 mL | Freq: Two times a day (BID) | INTRAVENOUS | Status: DC
  Administered 2017-03-31 – 2017-04-05 (×7): 10 mL
  Administered 2017-04-05 – 2017-04-06 (×2): 20 mL
  Administered 2017-04-07: 10 mL
  Administered 2017-04-08: 20 mL
  Administered 2017-04-08 – 2017-04-11 (×7): 10 mL

## 2017-03-31 MED ORDER — SODIUM CHLORIDE 0.9 % IV SOLN
2.0000 g | Freq: Two times a day (BID) | INTRAVENOUS | Status: DC
  Administered 2017-03-31 – 2017-04-02 (×6): 2 g via INTRAVENOUS
  Filled 2017-03-31 (×7): qty 2

## 2017-03-31 MED ORDER — SODIUM CHLORIDE 0.9% FLUSH: 10.0000 mL | INTRAVENOUS | Status: DC | PRN

## 2017-03-31 MED ORDER — CHLORHEXIDINE GLUCONATE CLOTH 2 % EX PADS
6.0000 | MEDICATED_PAD | Freq: Every day | CUTANEOUS | Status: DC
Start: 2017-03-31 — End: 2017-04-10
  Administered 2017-03-31 – 2017-04-06 (×6): 6 via TOPICAL

## 2017-03-31 MED ORDER — MORPHINE SULFATE (PF) 4 MG/ML IV SOLN
2.0000 mg | Freq: Once | INTRAVENOUS | Status: AC
  Administered 2017-03-31: 2 mg via INTRAVENOUS
  Filled 2017-03-31: qty 1

## 2017-03-31 MED ORDER — POTASSIUM CHLORIDE 10 MEQ/100ML IV SOLN
10.0000 meq | INTRAVENOUS | Status: AC
  Administered 2017-03-31 (×3): 10 meq via INTRAVENOUS
  Filled 2017-03-31 (×3): qty 100

## 2017-03-31 NOTE — Progress Notes (Signed)
[place] for heparin Indication: atrial fibrillation  No Known Allergies  Patient Measurements: Height: 5\' 1"  (154.9 cm) Weight: 254 lb 10.1 oz (115.5 kg) IBW/kg (Calculated) : 47.8 Heparin Dosing Weight: 73.6 kg  Vital Signs: Temp: 98.7 F (37.1 C) (03/30 1200) Temp Source: Oral (03/30 1200) BP: 96/60 (03/30 1100) Pulse Rate: 152 (03/30 1200)  Labs: Recent Labs    03/05/2017 1038 03/10/2017 1309 03/31/17 0513 03/31/17 1400  HGB 9.5* 9.9* 8.5*  --   HCT 28.9* 30.2* 25.5*  --   PLT 122* 102* 124*  --   HEPARINUNFRC  --   --  0.26* 0.44  CREATININE 2.48* 2.39* 2.22*  --     Estimated Creatinine Clearance: 29.5 mL/min (A) (by C-G formula based on SCr of 2.22 mg/dL (H)).   Assessment: 70 yoF with rapid afib. Pharmacy consulted to dose heparin. No anticoagulants PTA  Significant events:  Today, 03/31/2017:  CBC: hgb lower; Plt low but improved from yesterday  Most recent heparin level therapeutic on 1200 units/hr  No bleeding or infusion issues per nursing  CrCl: 30 ml/min  Goal of Therapy: Heparin level 0.3-0.7 units/ml Monitor platelets by anticoagulation protocol: Yes  Plan:  Continue heparin IV infusion at 12 units/hr  Recheck confirmatory heparin level in 8 hrs  Daily CBC, daily heparin level once stable  Monitor for signs of bleeding or thrombosis   Reuel Boom, PharmD, BCPS [phone] 03/31/2017, 3:50 PM

## 2017-03-31 NOTE — Progress Notes (Signed)
Pharmacy Antibiotic Note  Jo Collins is a 67 y.o. female admitted on 03/27/2017 with HCAP, sepsis, cellulitis and an abscess over her gluteal cleft concerning for a pilonidal abscess s/p bedside I&D in ED 3/29.  Pharmacy has been consulted for vancomycin and cefepime dosing.  Today, 03/31/2017: - Tmax 102.9, wbc elevated - scr trending down 2.22 (crcl~30) - all cultures have been negative thus far  Plan: - adjust vancomycin dose to 1000 mg IV q48h - change cefepime to 2gm IV q12h - f/u renal function  ____________________________________  Height: 5\' 1"  (154.9 cm) Weight: 254 lb 10.1 oz (115.5 kg) IBW/kg (Calculated) : 47.8  Temp (24hrs), Avg:100.3 F (37.9 C), Min:98.4 F (36.9 C), Max:102.9 F (39.4 C)  Recent Labs  Lab 03/04/2017 1038 03/22/2017 1309 03/02/2017 1335 03/08/2017 1523 03/31/17 0513  WBC 20.2* 18.5*  --   --  18.9*  CREATININE 2.48* 2.39*  --   --  2.22*  LATICACIDVEN  --   --  3.00* 2.60*  --     Estimated Creatinine Clearance: 29.5 mL/min (A) (by C-G formula based on SCr of 2.22 mg/dL (H)).    No Known Allergies  Antimicrobials this admission: vanc 3/29>> Cefepime 3/29>>  Dose adjustments this admission: --  Microbiology results: 3/29 at 1340 BCx1:  3/29 at 1914 bcx x1:  3/29 MRSA pcr: neg  Thank you for allowing pharmacy to be a part of this patient's care.  Dia Sitter, PharmD, BCPS 03/31/2017 8:26 AM

## 2017-03-31 NOTE — Progress Notes (Signed)
PROGRESS NOTE    Jo Collins  BWI:[id number] DOB: 09/07/1950 DOA: 03/28/2017 PCP: Deland Pretty, MD   Brief Narrative:  Jo Collins is a 67 y.o. female with multiple medical problems including paroxysmal atrial fibrillation not on anticoagulation, large B cell lymphoma, ongoing chemotherapy with recent tumor lysis syndrome resulting in acute renal failure, anemia and hospitalization, prior biopsy of the right iliac crest in January and ongoing issues with low back pain with radiation down to the left leg with weakness and numbness who was seen last week in the emergency department for complaints of leg pain, diplopia. She was admitted for afib with RVR and possible sepsis from pneumonia.     Assessment & Plan:   Active Problems:   NHL (non-Hodgkin's lymphoma) (HCC)   Morbid obesity with BMI of 50.0-59.9, adult (HCC)   Diffuse large B cell lymphoma (HCC)   Recurrent right pleural effusion   Typical atrial flutter (HCC)   Sepsis (HCC)   Leukocytosis   Acute on chronic renal failure (HCC)   Relapsed diffuse B cell Lymphoma:  Further management as per Dr Irene Limbo.    Atrial fibrillation with RVR:  Currently on IV amiodarone and IV cardizem for rate control, IV heparin for anti coagulation.  appreciate cardiology recommendations.     ARF: ? Prerenal vs tumour lysis syndrome.  Baseline creatinine less than 1.  Fluid resuscitation and repeat renal parameters in am.  Repeat UA, get urine electrolytes and US renal.    Right eye swelling, diplopia,  ptosis, ? 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  Get MRI, MRA , MRV of the head when stable. . Evaluate for aneurysm.  Get LP to evaluate for CNS lymphoma. Ordered placed.   Lower extremity weakness, left foot drop :  ? Lymphoma involving the nerve roots, when stable repeat MRI lumbar spine with contrast if creatinine allows.    Sepsis from right sided pneumonia:  On broad spectrum IV antibiotics.  Follow blood cultures.  Trend lactate, fluid resuscitation.  If needed will add stress dose steroids.  Afebrile this am, with wbc counts of 18,900.    Anemia and thrombocytopenia:  From chemotherapy.  Transfuse to keep hemoglobin greater than 7.    Hypokalemia  Replaced. And repeat in am.         DVT prophylaxis:heparin Code Status: full code.  Family Communication: family at bedside.  Disposition Plan: pending resolution of sepsis, atrial fib.     Consultants:   Neurology.   Cardiology.   Procedures: none.   Antimicrobials: vancomycin and cefepime for the pneumonia.    Subjective: Reports feeling okay, no chest pain or sob.  No nausea or vomiting.   Objective: Vitals:   03/31/17 0900 03/31/17 1000 03/31/17 1100 03/31/17 1200  BP: 97/66 107/72 96/60   Pulse: (!) 150 (!) 148 (!) 149 (!) 152  Resp: 15 16 19  (!) 25  Temp:    98.7 F (37.1 C)  TempSrc:    Oral  SpO2: 95% (!) 89% 95% 96%  Weight:      Height:        Intake/Output Summary (Last 24 hours) at 03/31/2017 1329 Last data filed at 03/31/2017 1227 Gross per 24 hour  Intake 4766.1 ml  Output 100 ml  Net 4666.1 ml   Filed Weights   03/21/2017 1855  Weight: 115.5 kg (254 lb 10.1 oz)    Examination:  General exam: not in any distress,  Respiratory system:  Respiratory effort normal.  Diminished at bases.scatter wheezing  Cardiovascular system: tachycardic, irregular, no murmers can be appreciated.  No pedal edema. Gastrointestinal system: Abdomen is nondistended, soft and nontender. No organomegaly or masses felt. Normal bowel sounds heard. Central nervous system: Alert and oriented. No focal neurological deficits. Extremities: Symmetric 5 x 5 power. Skin: No rashes, lesions or ulcers Psychiatry: Judgement and insight appear normal. Mood & affect appropriate.     Data Reviewed: I have personally reviewed following labs and imaging studies  CBC: Recent Labs  Lab 03/20/2017 1038  03/14/2017 1309 03/31/17 0513  WBC 20.2* 18.5* 18.9*  NEUTROABS 16.7* 14.0*  --   HGB 9.5* 9.9* 8.5*  HCT 28.9* 30.2* 25.5*  MCV 85.0 85.8 85.0  PLT 122* 102* 270*   Basic Metabolic Panel: Recent Labs  Lab 03/09/2017 1038 03/29/2017 1309 03/31/17 0513  NA 139 141 141  K 3.4* 3.6 3.1*  CL 100 103 107  CO2 24 21* 22  GLUCOSE 98 89 87  BUN 38* 38* 30*  CREATININE 2.48* 2.39* 2.22*  CALCIUM 9.2 8.9 8.3*   GFR: Estimated Creatinine Clearance: 29.5 mL/min (A) (by C-G formula based on SCr of 2.22 mg/dL (H)). Liver Function Tests: Recent Labs  Lab 03/11/2017 1038 03/02/2017 1309 03/31/17 0513  AST 45* 47* 51*  ALT 23 25 25   ALKPHOS 121 105 96  BILITOT 1.0 1.1 0.8  PROT 5.8* 5.5* 4.8*  ALBUMIN 2.9* 2.8* 2.4*   No results for input(s): LIPASE, AMYLASE in the last 168 hours. No results for input(s): AMMONIA in the last 168 hours. Coagulation Profile: No results for input(s): INR, PROTIME in the last 168 hours. Cardiac Enzymes: No results for input(s): CKTOTAL, CKMB, CKMBINDEX, TROPONINI in the last 168 hours. BNP (last 3 results) No results for input(s): PROBNP in the last 8760 hours. HbA1C: No results for input(s): HGBA1C in the last 72 hours. CBG: No results for input(s): GLUCAP in the last 168 hours. Lipid Profile: No results for input(s): CHOL, HDL, LDLCALC, TRIG, CHOLHDL, LDLDIRECT in the last 72 hours. Thyroid Function Tests: No results for input(s): TSH, T4TOTAL, FREET4, T3FREE, THYROIDAB in the last 72 hours. Anemia Panel: Recent Labs    03/16/2017 1038  RETICCTPCT 1.6   Sepsis Labs: Recent Labs  Lab 03/03/2017 1335 03/03/2017 1523  LATICACIDVEN 3.00* 2.60*    Recent Results (from the past 240 hour(s))  MRSA PCR Screening     Status: None   Collection Time: 03/24/2017  6:56 PM  Result Value Ref Range Status   MRSA by PCR NEGATIVE NEGATIVE Final    Comment:        The GeneXpert MRSA Assay (FDA approved for NASAL specimens only), is one component of  a comprehensive MRSA colonization surveillance program. It is not intended to diagnose MRSA infection nor to guide or monitor treatment for MRSA infections. Performed at [facility], [place] [address] [place], [place] [address]          Radiology Studies: Ct Head Wo Contrast  Result Date: 03/06/2017 CLINICAL DATA:  Double vision. EXAM: CT HEAD WITHOUT CONTRAST TECHNIQUE: Contiguous axial images were obtained from the base of the skull through the vertex without intravenous contrast. COMPARISON:  MRI of February 06, 2017. FINDINGS: Brain: Mild chronic ischemic white matter disease is noted. No mass effect or midline shift is noted. Ventricular size is within normal limits. There is no evidence of mass lesion, hemorrhage or acute infarction. Vascular: No hyperdense vessel or unexpected calcification. Skull: Normal. Negative for fracture or focal  lesion. Sinuses/Orbits: No acute finding. Other: None. IMPRESSION: Mild chronic ischemic white matter disease. No acute intracranial abnormality seen. Electronically Signed   By: Marijo Conception, M.D.   On: 03/03/2017 15:01   Dg Chest Port 1 View  Result Date: 03/13/2017 CLINICAL DATA:  Tachycardia EXAM: PORTABLE CHEST 1 VIEW COMPARISON:  March 17, 2017 FINDINGS: Port-A-Cath tip is in the superior vena cava. No pneumothorax. There is airspace consolidation in the right lower lobe with small right pleural effusion. Left lung is clear. Heart is borderline enlarged with pulmonary vascularity within normal limits. No adenopathy. No bone lesions. IMPRESSION: Airspace consolidation consistent with pneumonia right lower lung zone with small right pleural effusion. Left lung clear. Stable cardiac silhouette. Port-A-Cath tip in superior vena cava. No pneumothorax. Electronically Signed   By: Lowella Grip III M.D.   On: 03/31/2017 14:06        Scheduled Meds: . Chlorhexidine Gluconate Cloth  6 each Topical Daily  . sodium chloride  flush  10-40 mL Intracatheter Q12H   Continuous Infusions: . sodium chloride 100 mL/hr at 03/31/17 0348  . amiodarone 30 mg/hr (03/31/17 0600)  . ceFEPime (MAXIPIME) IV Stopped (03/31/17 1227)  . heparin Stopped (03/31/17 0768)  . heparin 1,200 Units/hr (03/31/17 0881)  . [START ON 04/01/2017] vancomycin       LOS: 1 day    Time spent: 35 minutes.     Hosie Poisson, MD [facility] Pager [phone]   If 7PM-7AM, please contact night-coverage [email or web address] Password [email or web address] 03/31/2017, 1:29 PM

## 2017-03-31 NOTE — Consult Note (Addendum)
CONSULTATION NOTE   Patient Name: Jo Collins Date of Encounter: 03/31/2017 Cardiologist: No primary care provider on file.  Chief Complaint   A-fib with RVR  Patient Profile   67 year old female with history of diffuse large B-cell lymphoma, sciatica, GERD, morbid obesity who presented with acute renal failure, pancytopenia and concern for sepsis.  We are asked to see her for the development of atrial fibrillation with rapid ventricular response.  HPI   Jo Collins is a 67 y.o. female who is being seen today for the evaluation of A. fib with RVR at the request of Dr. Karleen Hampshire. This is a 68 year old female with a history of diffuse large B-cell lymphoma who recently relapsed, morbid obesity, and presentation with acute renal failure, dehydration and pancytopenia.  There is concern for sepsis.  Yesterday evening she developed A. fib with RVR.  She was instructed by Dr. Marlou Porch with cardiology to start amiodarone.  Please see Dr. Nelly Laurence on call note from last evening.  I personally reviewed the EKG from yesterday which shows atrial flutter with 2-1 AV conduction at 157. She was febrile overnight.  PMHx   Past Medical History:  Diagnosis Date  . Abdominal pain, RUQ   . Abnormal EKG 08/07/2016  . Acute midline thoracic back pain   . Acute respiratory distress 09/25/2016  . Anemia 08/07/2016  . Chronic low back pain 09/25/2016  . Cough   . DLBCL (diffuse large B cell lymphoma) ([place]) 08/17/2016  . GERD (gastroesophageal reflux disease)   . History of chemotherapy   . Hypokalemia 09/25/2016  . 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 Lymphadenopathy   . Morbid obesity with BMI of 50.0-59.9, adult ([place])   . Neutrophilic leukocytosis 73/04/1935  . NHL (non-Hodgkin's lymphoma) ([place])   . Pleural effusion on right 08/07/2016  . Recurrent pleural effusion on right 10/08/2016  . Recurrent right pleural effusion 09/25/2016  . S/P thoracentesis   . Shortness of breath 08/07/2016  . SOB (shortness of breath)  09/26/2016    Past Surgical History:  Procedure Laterality Date  . ABDOMINAL HYSTERECTOMY    . IR CV LINE INJECTION  10/26/2016  . IR FLUORO GUIDE PORT INSERTION RIGHT  08/18/2016  . IR FLUORO GUIDE PORT INSERTION RIGHT  11/03/2016  . IR REMOVAL TUN CV CATH W/O FL  11/03/2016  . IR THORACENTESIS ASP PLEURAL SPACE W/IMG GUIDE  08/07/2016  . IR THORACENTESIS ASP PLEURAL SPACE W/IMG GUIDE  08/14/2016  . IR US GUIDE VASC ACCESS RIGHT  08/18/2016  . IR US GUIDE VASC ACCESS RIGHT  11/03/2016    FAMHx   Family History  Problem Relation Age of Onset  . COPD Mother   . Diabetes Mellitus II Sister   . Diabetes Mellitus II Brother   . Diabetes Mellitus II Sister   . Stroke Sister   . Hypertension Sister   . Diabetes Mellitus II Sister     SOCHx    reports that she quit smoking about 8 months ago. Her smoking use included cigarettes. She has a 23.50 pack-year smoking history. She has never used smokeless tobacco. She reports that she does not drink alcohol or use drugs.  Outpatient Medications   No current facility-administered medications on file prior to encounter.    Current Outpatient Medications on File Prior to Encounter  Medication Sig Dispense Refill  . acetaminophen (TYLENOL) 500 MG tablet Take 1,000 mg by mouth every 6 (six) hours as needed for moderate pain.    [name] dexamethasone (DECADRON) 4 MG tablet  56m po daily with breakfast from D1 to D4 each cycle of chemotherapy 40 tablet 2  . gabapentin (NEURONTIN) 300 MG capsule 3062mpo with breakfast and lunch and 60048mt bedtime. 120 capsule 0  . HYDROcodone-acetaminophen (NORCO) 5-325 MG tablet Take 1 tablet by mouth every 6 (six) hours as needed for severe pain. 20 tablet 0  . predniSONE (DELTASONE) 10 MG tablet 6 TABLETS BY MOUTH DAILY FOR 5 DAYS STARTING 03/26/17  0  . traMADol (ULTRAM) 50 MG tablet Take 50-100 mg by mouth every 6 (six) hours as needed for moderate pain or severe pain.    . aMarland Kitchenpirin EC 81 MG tablet Take 1 tablet (81  mg total) by mouth daily. (Patient not taking: Reported on 03/23/2017) 90 tablet 3  . furosemide (LASIX) 20 MG tablet Take 1 tablet (20 mg total) by mouth daily. (Patient not taking: Reported on 03/04/2017) 30 tablet 1  . predniSONE (DELTASONE) 20 MG tablet 3 tabs po daily x 5 days STARTING ON 03/26/17 (Patient not taking: Reported on 03/29/2017) 15 tablet 0    Inpatient Medications    Scheduled Meds: . Chlorhexidine Gluconate Cloth  6 each Topical Daily  . sodium chloride flush  10-40 mL Intracatheter Q12H    Continuous Infusions: . sodium chloride 100 mL/hr at 03/31/17 0348  . amiodarone 30 mg/hr (03/31/17 0600)  . ceFEPime (MAXIPIME) IV    . heparin Stopped (03/31/17 [id number]. heparin 1,200 Units/hr (03/31/17 [id number]. potassium chloride 10 mEq (03/31/17 0809)  . [START ON 04/01/2017] vancomycin      PRN Meds: acetaminophen, levalbuterol, morphine injection, ondansetron (ZOFRAN) IV, sodium chloride flush   ALLERGIES   No Known Allergies  ROS   Pertinent items noted in HPI and remainder of comprehensive ROS otherwise negative.  Vitals   Vitals:   03/31/17 0500 03/31/17 0600 03/31/17 0700 03/31/17 0800  BP: (!) 99/54 101/69 (!) 96/53 (!) 84/57  Pulse: (!) 151 (!) 151 (!) 150 (!) 150  Resp: (!) 21 14 20  (!) 23  Temp:  (!) 100.7 F (38.2 C)  99.8 F (37.7 C)  TempSrc:  Oral  Oral  SpO2: 94% 93% 94% 94%  Weight:      Height:        Intake/Output Summary (Last 24 hours) at 03/31/2017 [id number] data filed at 03/31/2017 [id number] per 24 hour  Intake 4567 ml  Output -  Net 4567 ml   Filed Weights   03/17/2017 1855  Weight: 254 lb 10.1 oz (115.5 kg)    Physical Exam   General appearance: alert, mild distress and morbidly obese Neck: no carotid bruit, no JVD, thyroid not enlarged, symmetric, no tenderness/mass/nodules and right eye proptosis Lungs: diminished breath sounds bilaterally and wheezes bilaterally Heart: regular tachycardia Abdomen: soft, non-tender; bowel  sounds normal; no masses,  no organomegaly Extremities: extremities normal, atraumatic, no cyanosis or edema Pulses: 2+ and symmetric Skin: Skin color, texture, turgor normal. No rashes or lesions Neurologic: Mental status: Alert, oriented, thought content appropriate Psych: Pleasant  Labs   Results for orders placed or performed during the hospital encounter of 03/19/2017 (from the past 48 hour(s))  Comprehensive metabolic panel     Status: Abnormal   Collection Time: 03/21/2017  1:09 PM  Result Value Ref Range   Sodium 141 135 - 145 mmol/L   Potassium 3.6 3.5 - 5.1 mmol/L   Chloride 103 101 - 111 mmol/L   CO2 21 (L) 22 - 32 mmol/L   Glucose, Bld  89 65 - 99 mg/dL   BUN 38 (H) 6 - 20 mg/dL   Creatinine, Ser 2.39 (H) 0.44 - 1.00 mg/dL   Calcium 8.9 8.9 - 10.3 mg/dL   Total Protein 5.5 (L) 6.5 - 8.1 g/dL   Albumin 2.8 (L) 3.5 - 5.0 g/dL   AST 47 (H) 15 - 41 U/L   ALT 25 14 - 54 U/L   Alkaline Phosphatase 105 38 - 126 U/L   Total Bilirubin 1.1 0.3 - 1.2 mg/dL   GFR calc non Af Amer 20 (L) >60 mL/min   GFR calc Af Amer 23 (L) >60 mL/min    Comment: (NOTE) The eGFR has been calculated using the CKD EPI equation. This calculation has not been validated in all clinical situations. eGFR's persistently <60 mL/min signify possible Chronic Kidney Disease.    Anion gap 17 (H) 5 - 15    Comment: Performed at [facility], [place] [address], [place], [place] [address]  CBC WITH DIFFERENTIAL     Status: Abnormal   Collection Time: 03/07/2017  1:09 PM  Result Value Ref Range   WBC 18.5 (H) 4.0 - 10.5 K/uL   RBC 3.52 (L) 3.87 - 5.11 MIL/uL   Hemoglobin 9.9 (L) 12.0 - 15.0 g/dL   HCT 30.2 (L) 36.0 - 46.0 %   MCV 85.8 78.0 - 100.0 fL   MCH 28.1 26.0 - 34.0 pg   MCHC 32.8 30.0 - 36.0 g/dL   RDW 17.4 (H) 11.5 - 15.5 %   Platelets 102 (L) 150 - 400 K/uL    Comment: SPECIMEN CHECKED FOR CLOTS REPEATED TO VERIFY PLATELET COUNT CONFIRMED BY SMEAR    Neutrophils Relative % 76 %     Lymphocytes Relative 16 %   Monocytes Relative 8 %   Eosinophils Relative 0 %   Basophils Relative 0 %   Band Neutrophils 0 %   Metamyelocytes Relative 0 %   Myelocytes 0 %   Promyelocytes Absolute 0 %   Blasts 0 %   nRBC 2 (H) 0 /100 WBC   Other 0 %   Neutro Abs 14.0 (H) 1.7 - 7.7 K/uL   Lymphs Abs 3.0 0.7 - 4.0 K/uL   Monocytes Absolute 1.5 (H) 0.1 - 1.0 K/uL   Eosinophils Absolute 0.0 0.0 - 0.7 K/uL   Basophils Absolute 0.0 0.0 - 0.1 K/uL   RBC Morphology RARE NRBCs    WBC Morphology TOXIC GRANULATION     Comment: Performed at [facility], [place] [address], [place], [place] [address]  I-Stat CG4 Lactic Acid, ED  (not at  [facility])     Status: Abnormal   Collection Time: 03/22/2017  1:35 PM  Result Value Ref Range   Lactic Acid, Venous 3.00 (HH) 0.5 - 1.9 mmol/L   Comment NOTIFIED PHYSICIAN   I-Stat CG4 Lactic Acid, ED  (not at  [facility])     Status: Abnormal   Collection Time: 03/07/2017  3:23 PM  Result Value Ref Range   Lactic Acid, Venous 2.60 (HH) 0.5 - 1.9 mmol/L   Comment NOTIFIED PHYSICIAN   MRSA PCR Screening     Status: None   Collection Time: 03/02/2017  6:56 PM  Result Value Ref Range   MRSA by PCR NEGATIVE NEGATIVE    Comment:        The GeneXpert MRSA Assay (FDA approved for NASAL specimens only), is one component of a comprehensive MRSA colonization surveillance program. It is not intended to diagnose MRSA  infection nor to guide or monitor treatment for MRSA infections. Performed at [facility], [place] [address] [place], [place] [address]   CBC     Status: Abnormal   Collection Time: 03/31/17  5:13 AM  Result Value Ref Range   WBC 18.9 (H) 4.0 - 10.5 K/uL   RBC 3.00 (L) 3.87 - 5.11 MIL/uL   Hemoglobin 8.5 (L) 12.0 - 15.0 g/dL   HCT 25.5 (L) 36.0 - 46.0 %   MCV 85.0 78.0 - 100.0 fL   MCH 28.3 26.0 - 34.0 pg   MCHC 33.3 30.0 - 36.0 g/dL   RDW 17.9 (H) 11.5 - 15.5 %   Platelets 124 (L) 150 - 400 K/uL    Comment:  Performed at [facility], [place] [address] [place], [place] [address]  Comprehensive metabolic panel     Status: Abnormal   Collection Time: 03/31/17  5:13 AM  Result Value Ref Range   Sodium 141 135 - 145 mmol/L   Potassium 3.1 (L) 3.5 - 5.1 mmol/L   Chloride 107 101 - 111 mmol/L   CO2 22 22 - 32 mmol/L   Glucose, Bld 87 65 - 99 mg/dL   BUN 30 (H) 6 - 20 mg/dL   Creatinine, Ser 2.22 (H) 0.44 - 1.00 mg/dL   Calcium 8.3 (L) 8.9 - 10.3 mg/dL   Total Protein 4.8 (L) 6.5 - 8.1 g/dL   Albumin 2.4 (L) 3.5 - 5.0 g/dL   AST 51 (H) 15 - 41 U/L   ALT 25 14 - 54 U/L   Alkaline Phosphatase 96 38 - 126 U/L   Total Bilirubin 0.8 0.3 - 1.2 mg/dL   GFR calc non Af Amer 22 (L) >60 mL/min   GFR calc Af Amer 25 (L) >60 mL/min    Comment: (NOTE) The eGFR has been calculated using the CKD EPI equation. This calculation has not been validated in all clinical situations. eGFR's persistently <60 mL/min signify possible Chronic Kidney Disease.    Anion gap 12 5 - 15    Comment: Performed at [facility], [place] [address] [place], [place] [address]  Heparin level (unfractionated)     Status: Abnormal   Collection Time: 03/31/17  5:13 AM  Result Value Ref Range   Heparin Unfractionated 0.26 (L) 0.30 - 0.70 IU/mL    Comment:        IF HEPARIN RESULTS ARE BELOW EXPECTED VALUES, AND PATIENT DOSAGE HAS BEEN CONFIRMED, SUGGEST FOLLOW UP TESTING OF ANTITHROMBIN III LEVELS. Performed at [facility], [place] [address], [place], [place] [address]     ECG   Atrial flutter with 2:1 AVB at 157 (3/29) - Personally Reviewed  Telemetry   Atrial flutter with 2:1 AV conduction at 150 - Personally Reviewed  Radiology   Ct Head Wo Contrast  Result Date: 03/20/2017 CLINICAL DATA:  Double vision. EXAM: CT HEAD WITHOUT CONTRAST TECHNIQUE: Contiguous axial images were obtained from the base of the skull through the vertex without intravenous contrast.  COMPARISON:  MRI of February 06, 2017. FINDINGS: Brain: Mild chronic ischemic white matter disease is noted. No mass effect or midline shift is noted. Ventricular size is within normal limits. There is no evidence of mass lesion, hemorrhage or acute infarction. Vascular: No hyperdense vessel or unexpected calcification. Skull: Normal. Negative for fracture or focal lesion. Sinuses/Orbits: No acute finding. Other: None. IMPRESSION: Mild chronic ischemic white matter disease. No acute intracranial abnormality seen. Electronically Signed  By: Marijo Conception, M.D.   On: 03/02/2017 15:01   Dg Chest Port 1 View  Result Date: 03/08/2017 CLINICAL DATA:  Tachycardia EXAM: PORTABLE CHEST 1 VIEW COMPARISON:  March 17, 2017 FINDINGS: Port-A-Cath tip is in the superior vena cava. No pneumothorax. There is airspace consolidation in the right lower lobe with small right pleural effusion. Left lung is clear. Heart is borderline enlarged with pulmonary vascularity within normal limits. No adenopathy. No bone lesions. IMPRESSION: Airspace consolidation consistent with pneumonia right lower lung zone with small right pleural effusion. Left lung clear. Stable cardiac silhouette. Port-A-Cath tip in superior vena cava. No pneumothorax. Electronically Signed   By: Lowella Grip III M.D.   On: 03/19/2017 14:06    Cardiac Studies   N/A  Impression   Active Problems:   NHL (non-Hodgkin's lymphoma) (HCC)   Morbid obesity with BMI of 50.0-59.9, adult (HCC)   Diffuse large B cell lymphoma (HCC)   Recurrent right pleural effusion   Typical atrial flutter (HCC)   Sepsis (HCC)   Leukocytosis   Acute on chronic renal failure [facility])   Recommendation   Mrs. Hendley is a typical atrial flutter with 2-1 AV conduction.  She reports being asymptomatic with it.  Her blood pressure is stable, not on pressors.  This is a very stable rhythm, and may slow down with amiodarone, but I suspect ultimately she will need cardioversion.   I think it is important to optimize her underlying risk including treating dehydration, renal failure and electrolyte abnormalities.  She noted to be hypokalemic today which should be repleted to potassium greater than 4.0.  Magnesium was very low at 1.4 on 3/20.  Will recheck this.  She has been thrombocytopenic and required platelet transfusions x2 and continues to have decline in platelets. She was also febrile overnight - on antibiotics. We will add beta-blocker to amiodarone as BP allows for additional rate control. I'm not confident if we cardioverted her at this point she will maintain or achieve sinus rhythm.  Thanks for the consultation. Cardiology will follow with you.  Time Spent Directly with Patient:  I have spent a total of 45 minutes with the patient reviewing hospital notes, telemetry, EKGs, labs and examining the patient as well as establishing an assessment and plan that was discussed personally with the patient. > 50% of time was spent in direct patient care.  Length of Stay:  LOS: 1 day   Pixie Casino, MD, [facility], [place] Director of the Advanced Lipid Disorders &  Cardiovascular Risk Reduction Clinic Diplomate of the American Board of Clinical Lipidology Attending Cardiologist  Direct Dial: [phone]  Fax: [phone]  Website:  [email or web address] 03/31/2017, 8:29 AM

## 2017-03-31 NOTE — Progress Notes (Signed)
ANTICOAGULATION CONSULT NOTE - Follow Up Consult  Pharmacy Consult for Heparin Indication: atrial fibrillation  No Known Allergies  Patient Measurements: Height: 5\' 1"  (154.9 cm) Weight: 254 lb 10.1 oz (115.5 kg) IBW/kg (Calculated) : 47.8 Heparin Dosing Weight:   Vital Signs: Temp: 102.9 F (39.4 C) (03/30 0400) Temp Source: Oral (03/30 0400) BP: 99/54 (03/30 0500) Pulse Rate: 151 (03/30 0500)  Labs: Recent Labs    03/26/2017 1038 03/07/2017 1309 03/31/17 0513  HGB 9.5* 9.9* 8.5*  HCT 28.9* 30.2* 25.5*  PLT 122* 102* 124*  HEPARINUNFRC  --   --  0.26*  CREATININE 2.48* 2.39*  --     Estimated Creatinine Clearance: 27.4 mL/min (A) (by C-G formula based on SCr of 2.39 mg/dL (H)).   Medications:  Infusions:  . sodium chloride 100 mL/hr at 03/31/17 0348  . amiodarone 30 mg/hr (03/31/17 0200)  . ceFEPime (MAXIPIME) IV    . heparin 1,050 Units/hr (03/31/17 0300)  . heparin    . [START ON 04/01/2017] vancomycin      Assessment: Patient with low heparin level.  No heparin issues per RN.  Goal of Therapy:  Heparin level 0.3-0.7 units/ml Monitor platelets by anticoagulation protocol: Yes   Plan:  Increase heparin to 1200 units/hr Recheck level at [address], [place] 03/31/2017,6:00 AM

## 2017-04-01 ENCOUNTER — Inpatient Hospital Stay (HOSPITAL_COMMUNITY): Payer: Medicare Other

## 2017-04-01 DIAGNOSIS — I361 Nonrheumatic tricuspid (valve) insufficiency: Secondary | ICD-10-CM

## 2017-04-01 DIAGNOSIS — J189 Pneumonia, unspecified organism: Secondary | ICD-10-CM

## 2017-04-01 DIAGNOSIS — I4892 Unspecified atrial flutter: Secondary | ICD-10-CM

## 2017-04-01 DIAGNOSIS — I959 Hypotension, unspecified: Secondary | ICD-10-CM

## 2017-04-01 LAB — LACTIC ACID, PLASMA
Lactic Acid, Venous: 2.4 mmol/L (ref 0.5–1.9)
Lactic Acid, Venous: 1.9 mmol/L (ref 0.5–1.9)

## 2017-04-01 LAB — CBC
HCT: 24.1 % — ABNORMAL LOW (ref 36.0–46.0)
Hemoglobin: 8.1 g/dL — ABNORMAL LOW (ref 12.0–15.0)
MCH: 28.2 pg (ref 26.0–34.0)
MCHC: 33.6 g/dL (ref 30.0–36.0)
MCV: 84 fL (ref 78.0–100.0)
PLATELETS: 123 10*3/uL — AB (ref 150–400)
RBC: 2.87 MIL/uL — ABNORMAL LOW (ref 3.87–5.11)
RDW: 18.7 % — ABNORMAL HIGH (ref 11.5–15.5)
WBC: 25.2 10*3/uL — ABNORMAL HIGH (ref 4.0–10.5)

## 2017-04-01 LAB — BASIC METABOLIC PANEL
Anion gap: 11 (ref 5–15)
BUN: 28 mg/dL — AB (ref 6–20)
CALCIUM: 8.6 mg/dL — AB (ref 8.9–10.3)
CO2: 20 mmol/L — AB (ref 22–32)
CREATININE: 2.2 mg/dL — AB (ref 0.44–1.00)
Chloride: 110 mmol/L (ref 101–111)
GFR calc Af Amer: 26 mL/min — ABNORMAL LOW (ref >60)
GFR calc non Af Amer: 22 mL/min — ABNORMAL LOW (ref >60)
GLUCOSE: 87 mg/dL (ref 65–99)
Potassium: 4.4 mmol/L (ref 3.5–5.1)
Sodium: 141 mmol/L (ref 135–145)

## 2017-04-01 LAB — ECHOCARDIOGRAM COMPLETE
Height: 61 in
WEIGHTICAEL: 4074.1 [oz_av]

## 2017-04-01 LAB — HEPARIN LEVEL (UNFRACTIONATED): Heparin Unfractionated: 0.51 IU/mL (ref 0.30–0.70)

## 2017-04-01 LAB — MAGNESIUM: Magnesium: 2 mg/dL (ref 1.7–2.4)

## 2017-04-01 MED ORDER — SODIUM CHLORIDE 0.9 % IV SOLN
250.0000 mL | INTRAVENOUS | Status: DC
Start: 2017-04-01 — End: 2017-04-03

## 2017-04-01 MED ORDER — SODIUM CHLORIDE 0.9% FLUSH
3.0000 mL | INTRAVENOUS | Status: DC | PRN
Start: 2017-04-01 — End: 2017-04-12
  Administered 2017-04-06: 3 mL via INTRAVENOUS
  Filled 2017-04-01: qty 3

## 2017-04-01 MED ORDER — SODIUM CHLORIDE 0.9 % IV BOLUS
1000.0000 mL | Freq: Once | INTRAVENOUS | Status: AC
  Administered 2017-04-01: 1000 mL via INTRAVENOUS

## 2017-04-01 MED ORDER — HYDROCORTISONE NA SUCCINATE PF 100 MG IJ SOLR
100.0000 mg | Freq: Three times a day (TID) | INTRAMUSCULAR | Status: DC
  Administered 2017-04-01 – 2017-04-03 (×6): 100 mg via INTRAVENOUS
  Filled 2017-04-01 (×6): qty 2

## 2017-04-01 MED ORDER — SODIUM CHLORIDE 0.9 % IV BOLUS
500.0000 mL | Freq: Once | INTRAVENOUS | Status: AC
  Administered 2017-04-01: 500 mL via INTRAVENOUS

## 2017-04-01 MED ORDER — SODIUM CHLORIDE 0.9% FLUSH
3.0000 mL | Freq: Two times a day (BID) | INTRAVENOUS | Status: DC
  Administered 2017-04-02 – 2017-04-07 (×6): 3 mL via INTRAVENOUS

## 2017-04-01 MED ORDER — ALBUMIN HUMAN 5 % IV SOLN
12.5000 g | Freq: Four times a day (QID) | INTRAVENOUS | Status: AC
  Administered 2017-04-01 – 2017-04-02 (×3): 12.5 g via INTRAVENOUS
  Filled 2017-04-01 (×3): qty 250

## 2017-04-01 MED ORDER — MORPHINE SULFATE (PF) 4 MG/ML IV SOLN
1.0000 mg | INTRAVENOUS | Status: DC | PRN
  Administered 2017-04-01 – 2017-04-02 (×3): 1 mg via INTRAVENOUS
  Filled 2017-04-01 (×3): qty 1

## 2017-04-01 NOTE — Progress Notes (Signed)
  Echocardiogram 2D Echocardiogram has been performed.  Bobbye Charleston 04/01/2017, 3:22 PM

## 2017-04-01 NOTE — Consult Note (Signed)
PULMONARY / CRITICAL CARE MEDICINE   Name: Jo Collins MRN: [id number] DOB: [birth year]    ADMISSION DATE:  03/25/2017 CONSULTATION DATE:  3/30  CHIEF COMPLAINT: Consult for hypotension  HISTORY OF PRESENT ILLNESS:        Is a 67 year old undergoing relaxed large B cell lymphoma who presented to the department of emergency medicine with diplopia.  She has a history of atrial fibrillation intermittently and was found to be in atrial fibrillation relatively hypotensive.  She has been volume loaded and started on amiodarone but remains persistently hypotensive.  She denies fever cough or chills.  She denies abdominal pain or dysuria.  She has a Port-A-Cath in place.  She has been treated with vancomycin and cefepime and placed on stress dose steroids today without improvement.  At least part of her chemotherapy regimen has included Adriamycin.  I was present for an echo today which shows a vigorously contracting left ventricle, and a collapsing vena cava suggestive of volume depletion.  There is no pericardial effusion.  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  PAST SURGICAL HISTORY: She  has a past surgical history that includes Abdominal hysterectomy; IR THORACENTESIS  ASP PLEURAL SPACE W/IMG GUIDE (08/07/2016); IR THORACENTESIS ASP PLEURAL SPACE W/IMG GUIDE (08/14/2016); IR FLUORO GUIDE PORT INSERTION RIGHT (08/18/2016); IR US Guide Vasc Access Right (08/18/2016); IR CV Line Injection (10/26/2016); IR US Guide Vasc Access Right (11/03/2016); IR FLUORO GUIDE PORT INSERTION RIGHT (11/03/2016); and IR Removal Tun Cv Cath W/O FL (11/03/2016).  No Known Allergies  No current facility-administered medications on file prior to encounter.    Current Outpatient Medications on File Prior to Encounter  Medication Sig  . acetaminophen (TYLENOL) 500 MG tablet Take 1,000 mg by mouth every 6 (six) hours as needed for moderate pain.  [name] dexamethasone (DECADRON) 4 MG tablet 40mg  po daily with breakfast from D1 to D4 each cycle of chemotherapy  . gabapentin (NEURONTIN) 300 MG capsule 300mg  po with breakfast and lunch and 600mg  at bedtime.  [name] HYDROcodone-acetaminophen (NORCO) 5-325 MG tablet Take 1 tablet by mouth every 6 (six) hours as needed for severe pain.  . predniSONE (DELTASONE) 10 MG tablet 6 TABLETS BY MOUTH DAILY FOR 5 DAYS STARTING 03/26/17  . traMADol (ULTRAM) 50 MG tablet Take 50-100 mg by mouth every 6 (six) hours as needed for moderate pain or severe pain.  [name] aspirin EC 81 MG tablet Take 1 tablet (81 mg total) by mouth daily. (Patient not taking: Reported on 03/31/2017)  . furosemide (LASIX) 20 MG tablet Take 1 tablet (20 mg total) by mouth daily. (Patient not taking: Reported on 03/05/2017)  . predniSONE (DELTASONE) 20 MG tablet 3 tabs po daily x 5 days STARTING ON 03/26/17 (Patient not taking: Reported on 03/19/2017)    FAMILY HISTORY:  Her indicated that the status of her mother is unknown. She indicated that all of her four sisters are alive. She indicated that the status of her brother is  unknown.   SOCIAL HISTORY: She  reports that she quit smoking about 8 months ago. Her smoking use included cigarettes. She has a 23.50 pack-year smoking history. She has never used  smokeless tobacco. She reports that she does not drink alcohol or use drugs.  REVIEW OF SYSTEMS:   As above she is not having any subjective fevers chills or sweats.  She is not aware of any new adenopathy.  She has no productive cough.  He has no abdominal pain.  She has no chest pain.  SUBJECTIVE:  As above  VITAL SIGNS: BP (!) 87/55   Pulse (!) 142   Temp 99.1 F (37.3 C) (Oral)   Resp (!) 28   Ht 5\' 1"  (1.549 m)   Wt 254 lb 10.1 oz (115.5 kg)   SpO2 95%   BMI 48.11 kg/m   HEMODYNAMICS:    VENTILATOR SETTINGS:    INTAKE / OUTPUT: I/O last 3 completed shifts: In: 5340.8 [P.O.:240; I.V.:4800.8; IV Piggyback:300] Out: 650 [Urine:650]  PHYSICAL EXAMINATION: General: Very pleasant and in no overt distress breathing comfortably on room air Neuro: She is a little lethargic and has some difficulty in producing words but is eventually accurate in identifying the place and circumstances.  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 HEENT: There is no cervical or supraclavicular adenopathy Cardiovascular: 1 and S2 are irregularly irregular without murmur rub or gallop.  There is no dependent edema the limbs are warm Lungs: Respirations are unlabored there is symmetric air movement anteriorly and no wheezes Abdomen: The abdomen is obese and soft without any organomegaly masses or tenderness   LABS:  BMET Recent Labs  Lab 03/09/2017 1309 03/31/17 0513 04/01/17 0430  NA 141 141 141  K 3.6 3.1* 4.4  CL 103 107 110  CO2 21* 22 20*  BUN 38* 30* 28*  CREATININE 2.39* 2.22* 2.20*  GLUCOSE 89 87 87    Electrolytes Recent Labs  Lab 03/29/2017 1309 03/31/17 0513 03/31/17 1330 04/01/17 0430  CALCIUM 8.9 8.3*  --  8.6*  MG  --   --  2.2 2.0    CBC Recent Labs  Lab 03/04/2017 1309 03/31/17 0513 04/01/17 0430  WBC 18.5* 18.9* 25.2*  HGB 9.9* 8.5* 8.1*  HCT 30.2* 25.5* 24.1*  PLT 102* 124* 123*    Coag's No results for  input(s): APTT, INR in the last 168 hours.  Sepsis Markers Recent Labs  Lab 03/15/2017 1523 03/31/17 1433 04/01/17 0850  LATICACIDVEN 2.60* 3.1* 2.4*    ABG No results for input(s): PHART, PCO2ART, PO2ART in the last 168 hours.  Liver Enzymes Recent Labs  Lab 03/04/2017 1038 03/21/2017 1309 03/31/17 0513  AST 45* 47* 51*  ALT 23 25 25   ALKPHOS 121 105 96  BILITOT 1.0 1.1 0.8  ALBUMIN 2.9* 2.8* 2.4*    Cardiac Enzymes No results for input(s): TROPONINI, PROBNP in the last 168 hours.  Glucose No results for input(s): GLUCAP in the last 168 hours.  Imaging US Renal  Result Date: 04/01/2017 CLINICAL DATA:  Acute renal failure. EXAM: RENAL / URINARY TRACT ULTRASOUND COMPLETE COMPARISON:  None. FINDINGS: Right Kidney: Length: 10.6 cm. Echogenicity within normal limits. No mass or hydronephrosis visualized. Left Kidney: Length: 11.3 cm. Echogenicity within normal limits. No mass or hydronephrosis visualized. Bladder: Appears normal for degree of bladder distention. IMPRESSION: Normal renal ultrasound. Electronically Signed   By: Fidela Salisbury M.D.   On: 04/01/2017 01:21  STUDIES:  Admission chest x-ray showed likely right sided effusion and probably a right lower lobe infiltrate.  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  She is in atrial fibrillation and hypotensive.  ASSESSMENT / PLAN:  PULMONARY A: It is not clear that she has an infectious process in the right lower lobe.  Should she develop a cough I would consider empiric addition of atypical coverage.  For now vancomycin and cefepime should be adequate to cover a potential infectious component of her hypotension.  CARDIOVASCULAR A:  Potential does not appear to be on the basis of LV dysfunction related to her chemotherapy nor does she appear to have a malignant pericardial effusion.  She does appear to be volume under resuscitated and I have written orders for volume repletion.  Stress dose steroids have also been put in place.  RENAL A: She has a history of tumor lysis syndrome and acute kidney injury.      Lars Masson, MD Critical Care Medicine  Pager: [phone]  04/01/2017, 3:18 PM

## 2017-04-01 NOTE — Progress Notes (Signed)
PROGRESS NOTE    Jo Collins  YSA:[id number] DOB: [birth year] DOA: 03/12/2017 PCP: Deland Pretty, MD   Brief Narrative:  Jo Collins is a 67 y.o. female with multiple medical problems including paroxysmal atrial fibrillation not on anticoagulation, large B cell lymphoma, ongoing chemotherapy with recent tumor lysis syndrome resulting in acute renal failure, anemia and hospitalization, prior biopsy of the right iliac crest in January and ongoing issues with low back pain with radiation down to the left leg with weakness and numbness who was seen last week in the emergency department for complaints of leg pain, diplopia. She was admitted for afib with RVR and possible sepsis from pneumonia.     Assessment & Plan:   Active Problems:   NHL (non-Hodgkin's lymphoma) (HCC)   Morbid obesity with BMI of 50.0-59.9, adult (HCC)   Diffuse large B cell lymphoma (HCC)   Recurrent right pleural effusion   Typical atrial flutter (HCC)   Sepsis (HCC)   Leukocytosis   Acute on chronic renal failure (HCC)   Relapsed diffuse B cell Lymphoma:  Further management as per Dr Irene Limbo.    Atrial flutter  with RVR:  Currently on IV amiodarone  Without much improvement, IV heparin for anti coagulation.  appreciate cardiology recommendations.  Plan for cardioversion tomorrow.     ARF: ? Prerenal vs tumour lysis syndrome.  Baseline creatinine less than 1.  Fluid resuscitation and repeat renal parameters in am does not show much improvement.  FeNa is 0.2.  UA is negative for infection and US renal is negative for hydronephrosis.    Right eye swelling, diplopia,  ptosis, ? 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  Get MRI, MRA , MRV of the head when stable. . Evaluate for aneurysm.  Get LP to evaluate for CNS lymphoma. Ordered placed.  Unfortunately she couldn't get MRi done yesterday, as she couldn't lay flat.  We will try again tomorrow when she is more stable with pain  medication.   Lower extremity weakness, left foot drop :  ? Lymphoma involving the nerve roots, when stable repeat MRI lumbar spine with contrast if creatinine allows.    Sepsis from right sided pneumonia:  On broad spectrum IV antibiotics. Follow blood cultures.  Trend lactate, fluid resuscitation. Stress dose steroids added today. Keep MAP >65.  Pt not requiring [place] oxygen.     Anemia and thrombocytopenia:  From chemotherapy.  Transfuse to keep hemoglobin greater than 7.    Hypokalemia  Replaced. And repeat in am shows much improvement.  Magnesium level wnl.        DVT prophylaxis:heparin Code Status: full code.  Family Communication: family at bedside.  Disposition Plan: pending resolution of sepsis, atrial fib.     Consultants:   Neurology.   Cardiology.  PCCm for IV pressors.   Procedures: none.   Antimicrobials: vancomycin and cefepime for the pneumonia.    Subjective: Pt barely awake, received morphine earlier today and reports did not sleep welllast night,  Also not feeling good.   Objective: Vitals:   04/01/17 0600 04/01/17 0603 04/01/17 0630 04/01/17 0650  BP:  94/70    Pulse: (!) 145 (!) 145 (!) 156 (!) 144  Resp: (!) 24 16 (!) 29 (!) 21  Temp:      TempSrc:      SpO2: 97% 97% 97% 97%  Weight:      Height:        Intake/Output Summary (Last 24 hours) at 04/01/2017  0845 Last data filed at 04/01/2017 0600 Gross per 24 hour  Intake 3231.4 ml  Output 650 ml  Net 2581.4 ml   Filed Weights   03/27/2017 1855  Weight: 115.5 kg (254 lb 10.1 oz)    Examination:  General exam: not in any distress, but somnolent.  Respiratory system:  Respiratory effort normal. Diminished at bases.is not requiring oxygen.  Cardiovascular system: tachycardic, irregular, no murmers can be appreciated.   Gastrointestinal system: Abdomen is soft NT ND BS+ Central nervous system: Alert and oriented. Ptosis, unable to close the right eye. Lower extremity weakness.    Extremities: trace pedal edema.  Skin: No rashes, lesions or ulcers Psychiatry:  Mood & affect appropriate.     Data Reviewed: I have personally reviewed following labs and imaging studies  CBC: Recent Labs  Lab 03/16/2017 1038 03/07/2017 1309 03/31/17 0513 04/01/17 0430  WBC 20.2* 18.5* 18.9* 25.2*  NEUTROABS 16.7* 14.0*  --   --   HGB 9.5* 9.9* 8.5* 8.1*  HCT 28.9* 30.2* 25.5* 24.1*  MCV 85.0 85.8 85.0 84.0  PLT 122* 102* 124* 814*   Basic Metabolic Panel: Recent Labs  Lab 03/24/2017 1038 03/06/2017 1309 03/31/17 0513 03/31/17 1330 04/01/17 0430  NA 139 141 141  --  141  K 3.4* 3.6 3.1*  --  4.4  CL 100 103 107  --  110  CO2 24 21* 22  --  20*  GLUCOSE 98 89 87  --  87  BUN 38* 38* 30*  --  28*  CREATININE 2.48* 2.39* 2.22*  --  2.20*  CALCIUM 9.2 8.9 8.3*  --  8.6*  MG  --   --   --  2.2 2.0   GFR: Estimated Creatinine Clearance: 29.7 mL/min (A) (by C-G formula based on SCr of 2.2 mg/dL (H)). Liver Function Tests: Recent Labs  Lab 03/26/2017 1038 03/27/2017 1309 03/31/17 0513  AST 45* 47* 51*  ALT 23 25 25   ALKPHOS 121 105 96  BILITOT 1.0 1.1 0.8  PROT 5.8* 5.5* 4.8*  ALBUMIN 2.9* 2.8* 2.4*   No results for input(s): LIPASE, AMYLASE in the last 168 hours. No results for input(s): AMMONIA in the last 168 hours. Coagulation Profile: No results for input(s): INR, PROTIME in the last 168 hours. Cardiac Enzymes: No results for input(s): CKTOTAL, CKMB, CKMBINDEX, TROPONINI in the last 168 hours. BNP (last 3 results) No results for input(s): PROBNP in the last 8760 hours. HbA1C: No results for input(s): HGBA1C in the last 72 hours. CBG: No results for input(s): GLUCAP in the last 168 hours. Lipid Profile: No results for input(s): CHOL, HDL, LDLCALC, TRIG, CHOLHDL, LDLDIRECT in the last 72 hours. Thyroid Function Tests: No results for input(s): TSH, T4TOTAL, FREET4, T3FREE, THYROIDAB in the last 72 hours. Anemia Panel: Recent Labs    03/29/2017 1038   RETICCTPCT 1.6   Sepsis Labs: Recent Labs  Lab 03/07/2017 1335 03/07/2017 1523 03/31/17 1433  LATICACIDVEN 3.00* 2.60* 3.1*    Recent Results (from the past 240 hour(s))  Blood Culture (routine x 2)     Status: None (Preliminary result)   Collection Time: 03/05/2017  1:40 PM  Result Value Ref Range Status   Specimen Description   Final    BLOOD LEFT ANTECUBITAL Performed at [facility] [address] [place], [place] [address]    Special Requests   Final    BOTTLES DRAWN AEROBIC AND ANAEROBIC Blood Culture results may not be optimal due to an inadequate  volume of blood received in culture bottles Performed at [facility] [address], [place], [place] [address]    Culture   Final    NO GROWTH < 24 HOURS Performed at [facility] [address], [place], [place] [address]    Report Status PENDING  Incomplete  MRSA PCR Screening     Status: None   Collection Time: 03/02/2017  6:56 PM  Result Value Ref Range Status   MRSA by PCR NEGATIVE NEGATIVE Final    Comment:        The GeneXpert MRSA Assay (FDA approved for NASAL specimens only), is one component of a comprehensive MRSA colonization surveillance program. It is not intended to diagnose MRSA infection nor to guide or monitor treatment for MRSA infections. Performed at [facility], [place] [address] [place], [place] [address]   Blood Culture (routine x 2)     Status: None (Preliminary result)   Collection Time: 03/28/2017  7:14 PM  Result Value Ref Range Status   Specimen Description   Final    BLOOD RIGHT HAND Performed at [facility] [address], [place], [place] [address]    Special Requests   Final    AEB BCLV Performed at [facility] [address], [place], [place] [address]    Culture   Final    NO GROWTH < 24 HOURS Performed at [facility] [address], [place], [place] [address]     Report Status PENDING  Incomplete         Radiology Studies: Ct Head Wo Contrast  Result Date: 03/14/2017 CLINICAL DATA:  Double vision. EXAM: CT HEAD WITHOUT CONTRAST TECHNIQUE: Contiguous axial images were obtained from the base of the skull through the vertex without intravenous contrast. COMPARISON:  MRI of February 06, 2017. FINDINGS: Brain: Mild chronic ischemic white matter disease is noted. No mass effect or midline shift is noted. Ventricular size is within normal limits. There is no evidence of mass lesion, hemorrhage or acute infarction. Vascular: No hyperdense vessel or unexpected calcification. Skull: Normal. Negative for fracture or focal lesion. Sinuses/Orbits: No acute finding. Other: None. IMPRESSION: Mild chronic ischemic white matter disease. No acute intracranial abnormality seen. Electronically Signed   By: Marijo Conception, M.D.   On: 03/15/2017 15:01   US Renal  Result Date: 04/01/2017 CLINICAL DATA:  Acute renal failure. EXAM: RENAL / URINARY TRACT ULTRASOUND COMPLETE COMPARISON:  None. FINDINGS: Right Kidney: Length: 10.6 cm. Echogenicity within normal limits. No mass or hydronephrosis visualized. Left Kidney: Length: 11.3 cm. Echogenicity within normal limits. No mass or hydronephrosis visualized. Bladder: Appears normal for degree of bladder distention. IMPRESSION: Normal renal ultrasound. Electronically Signed   By: Fidela Salisbury M.D.   On: 04/01/2017 01:21   Dg Chest Port 1 View  Result Date: 03/29/2017 CLINICAL DATA:  Tachycardia EXAM: PORTABLE CHEST 1 VIEW COMPARISON:  March 17, 2017 FINDINGS: Port-A-Cath tip is in the superior vena cava. No pneumothorax. There is airspace consolidation in the right lower lobe with small right pleural effusion. Left lung is clear. Heart is borderline enlarged with pulmonary vascularity within normal limits. No adenopathy. No bone lesions. IMPRESSION: Airspace consolidation consistent with pneumonia right lower lung zone with small  right pleural effusion. Left lung clear. Stable cardiac silhouette. Port-A-Cath tip in superior vena cava. No pneumothorax. Electronically Signed   By: Lowella Grip III M.D.   On: 03/25/2017 14:06  Scheduled Meds: . Chlorhexidine Gluconate Cloth  6 each Topical Daily  . sodium chloride flush  10-40 mL Intracatheter Q12H   Continuous Infusions: . sodium chloride 100 mL/hr at 04/01/17 0117  . amiodarone 30 mg/hr (04/01/17 0117)  . ceFEPime (MAXIPIME) IV Stopped (03/31/17 2200)  . heparin 1,200 Units/hr (04/01/17 0008)  . vancomycin       LOS: 2 days    Time spent: 35 minutes.     Hosie Poisson, MD [facility] Pager [phone]   If 7PM-7AM, please contact night-coverage [email or web address] Password [facility] 04/01/2017, 8:45 AM

## 2017-04-01 NOTE — H&P (View-Only) (Signed)
DAILY PROGRESS NOTE   Patient Name: Jo Collins Date of Encounter: 04/01/2017  Chief Complaint   Tired  Patient Profile   67 year old female with history of diffuse large B-cell lymphoma, sciatica, GERD, morbid obesity who presented with acute renal failure, pancytopenia and concern for sepsis.  We are asked to see her for the development of atrial fibrillation with rapid ventricular response.  Subjective   Remains in atrial flutter with 2:1 conduction. She is asymptomatic with this. Does not seem to be responding to rate control on amiodarone. BP will not tolerate additional medications.  Objective   Vitals:   04/01/17 0600 04/01/17 0603 04/01/17 0630 04/01/17 0650  BP:  94/70    Pulse: (!) 145 (!) 145 (!) 156 (!) 144  Resp: (!) 24 16 (!) 29 (!) 21  Temp:      TempSrc:      SpO2: 97% 97% 97% 97%  Weight:      Height:        Intake/Output Summary (Last 24 hours) at 04/01/2017 0854 Last data filed at 04/01/2017 0600 Gross per 24 hour  Intake 3231.4 ml  Output 650 ml  Net 2581.4 ml   Filed Weights   03/27/2017 1855  Weight: 254 lb 10.1 oz (115.5 kg)    Physical Exam   General appearance: asleep, no distress Neck: no carotid bruit, no JVD and thyroid not enlarged, symmetric, no tenderness/mass/nodules Lungs: diminished breath sounds bilaterally Heart: regular tachycardia Abdomen: soft, non-tender; bowel sounds normal; no masses,  no organomegaly Extremities: extremities normal, atraumatic, no cyanosis or edema Pulses: 2+ and symmetric Skin: Skin color, texture, turgor normal. No rashes or lesions Neurologic: Mental status: asleep, no distress Psych: unable to assess  Inpatient Medications    Scheduled Meds: . Chlorhexidine Gluconate Cloth  6 each Topical Daily  . sodium chloride flush  10-40 mL Intracatheter Q12H    Continuous Infusions: . sodium chloride 100 mL/hr at 04/01/17 0117  . amiodarone 30 mg/hr (04/01/17 0117)  . ceFEPime (MAXIPIME) IV Stopped  (03/31/17 2200)  . heparin 1,200 Units/hr (04/01/17 0008)  . vancomycin      PRN Meds: acetaminophen, levalbuterol, morphine injection, ondansetron (ZOFRAN) IV, sodium chloride flush, traZODone   Labs   Results for orders placed or performed during the hospital encounter of 03/12/2017 (from the past 48 hour(s))  Comprehensive metabolic panel     Status: Abnormal   Collection Time: 03/28/2017  1:09 PM  Result Value Ref Range   Sodium 141 135 - 145 mmol/L   Potassium 3.6 3.5 - 5.1 mmol/L   Chloride 103 101 - 111 mmol/L   CO2 21 (L) 22 - 32 mmol/L   Glucose, Bld 89 65 - 99 mg/dL   BUN 38 (H) 6 - 20 mg/dL   Creatinine, Ser 2.39 (H) 0.44 - 1.00 mg/dL   Calcium 8.9 8.9 - 10.3 mg/dL   Total Protein 5.5 (L) 6.5 - 8.1 g/dL   Albumin 2.8 (L) 3.5 - 5.0 g/dL   AST 47 (H) 15 - 41 U/L   ALT 25 14 - 54 U/L   Alkaline Phosphatase 105 38 - 126 U/L   Total Bilirubin 1.1 0.3 - 1.2 mg/dL   GFR calc non Af Amer 20 (L) >60 mL/min   GFR calc Af Amer 23 (L) >60 mL/min    Comment: (NOTE) The eGFR has been calculated using the CKD EPI equation. This calculation has not been validated in all clinical situations. eGFR's persistently <60 mL/min signify possible Chronic Kidney  Disease.    Anion gap 17 (H) 5 - 15    Comment: Performed at [facility], [place] [address] [place], [place] [address]  CBC WITH DIFFERENTIAL     Status: Abnormal   Collection Time: 03/25/2017  1:09 PM  Result Value Ref Range   WBC 18.5 (H) 4.0 - 10.5 K/uL   RBC 3.52 (L) 3.87 - 5.11 MIL/uL   Hemoglobin 9.9 (L) 12.0 - 15.0 g/dL   HCT 30.2 (L) 36.0 - 46.0 %   MCV 85.8 78.0 - 100.0 fL   MCH 28.1 26.0 - 34.0 pg   MCHC 32.8 30.0 - 36.0 g/dL   RDW 17.4 (H) 11.5 - 15.5 %   Platelets 102 (L) 150 - 400 K/uL    Comment: SPECIMEN CHECKED FOR CLOTS REPEATED TO VERIFY PLATELET COUNT CONFIRMED BY SMEAR    Neutrophils Relative % 76 %   Lymphocytes Relative 16 %   Monocytes Relative 8 %   Eosinophils Relative 0 %    Basophils Relative 0 %   Band Neutrophils 0 %   Metamyelocytes Relative 0 %   Myelocytes 0 %   Promyelocytes Absolute 0 %   Blasts 0 %   nRBC 2 (H) 0 /100 WBC   Other 0 %   Neutro Abs 14.0 (H) 1.7 - 7.7 K/uL   Lymphs Abs 3.0 0.7 - 4.0 K/uL   Monocytes Absolute 1.5 (H) 0.1 - 1.0 K/uL   Eosinophils Absolute 0.0 0.0 - 0.7 K/uL   Basophils Absolute 0.0 0.0 - 0.1 K/uL   RBC Morphology RARE NRBCs    WBC Morphology TOXIC GRANULATION     Comment: Performed at [facility], [place] [address] [place], [place] [address]  I-Stat CG4 Lactic Acid, ED  (not at  [facility])     Status: Abnormal   Collection Time: 03/04/2017  1:35 PM  Result Value Ref Range   Lactic Acid, Venous 3.00 (HH) 0.5 - 1.9 mmol/L   Comment NOTIFIED PHYSICIAN   Blood Culture (routine x 2)     Status: None (Preliminary result)   Collection Time: 04/01/2017  1:40 PM  Result Value Ref Range   Specimen Description      BLOOD LEFT ANTECUBITAL Performed at [place] [address], [place], [place] [address]    Special Requests      BOTTLES DRAWN AEROBIC AND ANAEROBIC Blood Culture results may not be optimal due to an inadequate volume of blood received in culture bottles Performed at [facility], [place] [address] [place], [place] [address]    Culture      NO GROWTH < 24 HOURS Performed at [place] [address], [place], [place] [address]    Report Status PENDING   I-Stat CG4 Lactic Acid, ED  (not at  [facility])     Status: Abnormal   Collection Time: 03/06/2017  3:23 PM  Result Value Ref Range   Lactic Acid, Venous 2.60 (HH) 0.5 - 1.9 mmol/L   Comment NOTIFIED PHYSICIAN   MRSA PCR Screening     Status: None   Collection Time: 03/16/2017  6:56 PM  Result Value Ref Range   MRSA by PCR NEGATIVE NEGATIVE    Comment:        The GeneXpert MRSA Assay (FDA approved for NASAL specimens only), is one component of a comprehensive MRSA colonization surveillance program. It  is not intended to diagnose MRSA infection nor to guide or monitor treatment for  MRSA infections. Performed at [facility], [place] [address] [place], [place] [address]   Blood Culture (routine x 2)     Status: None (Preliminary result)   Collection Time: 03/02/2017  7:14 PM  Result Value Ref Range   Specimen Description      BLOOD RIGHT HAND Performed at [place] [address] [place], [place] [address]    Special Requests      AEB BCLV Performed at [facility], [place] [address] [place], [place] [address]    Culture      NO GROWTH < 24 HOURS Performed at [place] [address], [place], [place] [address]    Report Status PENDING   CBC     Status: Abnormal   Collection Time: 03/31/17  5:13 AM  Result Value Ref Range   WBC 18.9 (H) 4.0 - 10.5 K/uL   RBC 3.00 (L) 3.87 - 5.11 MIL/uL   Hemoglobin 8.5 (L) 12.0 - 15.0 g/dL   HCT 25.5 (L) 36.0 - 46.0 %   MCV 85.0 78.0 - 100.0 fL   MCH 28.3 26.0 - 34.0 pg   MCHC 33.3 30.0 - 36.0 g/dL   RDW 17.9 (H) 11.5 - 15.5 %   Platelets 124 (L) 150 - 400 K/uL    Comment: Performed at [facility], [place] [address], [place], [place] [address]  Comprehensive metabolic panel     Status: Abnormal   Collection Time: 03/31/17  5:13 AM  Result Value Ref Range   Sodium 141 135 - 145 mmol/L   Potassium 3.1 (L) 3.5 - 5.1 mmol/L   Chloride 107 101 - 111 mmol/L   CO2 22 22 - 32 mmol/L   Glucose, Bld 87 65 - 99 mg/dL   BUN 30 (H) 6 - 20 mg/dL   Creatinine, Ser 2.22 (H) 0.44 - 1.00 mg/dL   Calcium 8.3 (L) 8.9 - 10.3 mg/dL   Total Protein 4.8 (L) 6.5 - 8.1 g/dL   Albumin 2.4 (L) 3.5 - 5.0 g/dL   AST 51 (H) 15 - 41 U/L   ALT 25 14 - 54 U/L   Alkaline Phosphatase 96 38 - 126 U/L   Total Bilirubin 0.8 0.3 - 1.2 mg/dL   GFR calc non Af Amer 22 (L) >60 mL/min   GFR calc Af Amer 25 (L) >60 mL/min    Comment: (NOTE) The eGFR has been calculated using the CKD EPI  equation. This calculation has not been validated in all clinical situations. eGFR's persistently <60 mL/min signify possible Chronic Kidney Disease.    Anion gap 12 5 - 15    Comment: Performed at [facility] [place] [address] [place], [place] [address]  Heparin level (unfractionated)     Status: Abnormal   Collection Time: 03/31/17  5:13 AM  Result Value Ref Range   Heparin Unfractionated 0.26 (L) 0.30 - 0.70 IU/mL    Comment:        IF HEPARIN RESULTS ARE BELOW EXPECTED VALUES, AND PATIENT DOSAGE HAS BEEN CONFIRMED, SUGGEST FOLLOW UP TESTING OF ANTITHROMBIN III LEVELS. Performed at [facility], [place] [address], [place], [place] [address]   Magnesium     Status: None   Collection Time: 03/31/17  1:30 PM  Result Value Ref Range   Magnesium 2.2 1.7 - 2.4 mg/dL    Comment: Performed at [facility], [place] [address], [place], [place] [address]  Heparin level (  unfractionated)     Status: None   Collection Time: 03/31/17  2:00 PM  Result Value Ref Range   Heparin Unfractionated 0.44 0.30 - 0.70 IU/mL    Comment:        IF HEPARIN RESULTS ARE BELOW EXPECTED VALUES, AND PATIENT DOSAGE HAS BEEN CONFIRMED, SUGGEST FOLLOW UP TESTING OF ANTITHROMBIN III LEVELS. Performed at [facility], [place] [address] [place], [place] [address]   Lactic acid, plasma     Status: Abnormal   Collection Time: 03/31/17  2:33 PM  Result Value Ref Range   Lactic Acid, Venous 3.1 (HH) 0.5 - 1.9 mmol/L    Comment: CRITICAL RESULT CALLED TO, READ BACK BY AND VERIFIED WITH: HABIB,I RN [id number] C[place],N Performed at [facility], [place] [address], [place], [place] [address]   Urinalysis, Routine w reflex microscopic     Status: Abnormal   Collection Time: 03/31/17  3:01 PM  Result Value Ref Range   Color, Urine YELLOW YELLOW   APPearance HAZY (A) CLEAR   Specific Gravity, Urine 1.020 1.005 - 1.030   pH 5.0  5.0 - 8.0   Glucose, UA NEGATIVE NEGATIVE mg/dL   Hgb urine dipstick SMALL (A) NEGATIVE   Bilirubin Urine NEGATIVE NEGATIVE   Ketones, ur 5 (A) NEGATIVE mg/dL   Protein, ur NEGATIVE NEGATIVE mg/dL   Nitrite NEGATIVE NEGATIVE   Leukocytes, UA SMALL (A) NEGATIVE   RBC / HPF 6-30 0 - 5 RBC/hpf   WBC, UA 0-5 0 - 5 WBC/hpf   Bacteria, UA RARE (A) NONE SEEN   Squamous Epithelial / LPF 0-5 (A) NONE SEEN   Mucus PRESENT    Hyaline Casts, UA PRESENT     Comment: Performed at [facility], [place] [address] [place], [place] [address]  Sodium, urine, random     Status: None   Collection Time: 03/31/17  3:01 PM  Result Value Ref Range   Sodium, Ur 17 mmol/L    Comment: Performed at [facility], [place] [address] [place], [place] [address]  Creatinine, urine, random     Status: None   Collection Time: 03/31/17  3:01 PM  Result Value Ref Range   Creatinine, Urine 176.39 mg/dL    Comment: Performed at [facility], [place] [address], [place], [place] [address]  Heparin level (unfractionated)     Status: None   Collection Time: 03/31/17 10:00 PM  Result Value Ref Range   Heparin Unfractionated 0.40 0.30 - 0.70 IU/mL    Comment:        IF HEPARIN RESULTS ARE BELOW EXPECTED VALUES, AND PATIENT DOSAGE HAS BEEN CONFIRMED, SUGGEST FOLLOW UP TESTING OF ANTITHROMBIN III LEVELS. Performed at [facility], [place] [address], [place], [place] [address]   Magnesium     Status: None   Collection Time: 04/01/17  4:30 AM  Result Value Ref Range   Magnesium 2.0 1.7 - 2.4 mg/dL    Comment: Performed at [facility], [place] [address], [place], [place] [address]  Basic metabolic panel     Status: Abnormal   Collection Time: 04/01/17  4:30 AM  Result Value Ref Range   Sodium 141 135 - 145 mmol/L   Potassium 4.4 3.5 - 5.1 mmol/L    Comment: DELTA CHECK NOTED NO VISIBLE HEMOLYSIS    Chloride 110 101 - 111 mmol/L   CO2  20 (L) 22 - 32 mmol/L   Glucose, Bld 87 65 -  99 mg/dL   BUN 28 (H) 6 - 20 mg/dL   Creatinine, Ser 2.20 (H) 0.44 - 1.00 mg/dL   Calcium 8.6 (L) 8.9 - 10.3 mg/dL   GFR calc non Af Amer 22 (L) >60 mL/min   GFR calc Af Amer 26 (L) >60 mL/min    Comment: (NOTE) The eGFR has been calculated using the CKD EPI equation. This calculation has not been validated in all clinical situations. eGFR's persistently <60 mL/min signify possible Chronic Kidney Disease.    Anion gap 11 5 - 15    Comment: Performed at [facility], [place] [address] [place], [place] [address]  CBC     Status: Abnormal   Collection Time: 04/01/17  4:30 AM  Result Value Ref Range   WBC 25.2 (H) 4.0 - 10.5 K/uL   RBC 2.87 (L) 3.87 - 5.11 MIL/uL   Hemoglobin 8.1 (L) 12.0 - 15.0 g/dL   HCT 24.1 (L) 36.0 - 46.0 %   MCV 84.0 78.0 - 100.0 fL   MCH 28.2 26.0 - 34.0 pg   MCHC 33.6 30.0 - 36.0 g/dL   RDW 18.7 (H) 11.5 - 15.5 %   Platelets 123 (L) 150 - 400 K/uL    Comment: Performed at [facility], [place] [address], [place], [place] [address]    ECG   N/A  Telemetry   Atrial flutter with 2:1 AVB- Personally Reviewed  Radiology    Ct Head Wo Contrast  Result Date: 03/09/2017 CLINICAL DATA:  Double vision. EXAM: CT HEAD WITHOUT CONTRAST TECHNIQUE: Contiguous axial images were obtained from the base of the skull through the vertex without intravenous contrast. COMPARISON:  MRI of February 06, 2017. FINDINGS: Brain: Mild chronic ischemic white matter disease is noted. No mass effect or midline shift is noted. Ventricular size is within normal limits. There is no evidence of mass lesion, hemorrhage or acute infarction. Vascular: No hyperdense vessel or unexpected calcification. Skull: Normal. Negative for fracture or focal lesion. Sinuses/Orbits: No acute finding. Other: None. IMPRESSION: Mild chronic ischemic white matter disease. No acute intracranial abnormality seen. Electronically Signed    By: Marijo Conception, M.D.   On: 03/18/2017 15:01   US Renal  Result Date: 04/01/2017 CLINICAL DATA:  Acute renal failure. EXAM: RENAL / URINARY TRACT ULTRASOUND COMPLETE COMPARISON:  None. FINDINGS: Right Kidney: Length: 10.6 cm. Echogenicity within normal limits. No mass or hydronephrosis visualized. Left Kidney: Length: 11.3 cm. Echogenicity within normal limits. No mass or hydronephrosis visualized. Bladder: Appears normal for degree of bladder distention. IMPRESSION: Normal renal ultrasound. Electronically Signed   By: Fidela Salisbury M.D.   On: 04/01/2017 01:21   Dg Chest Port 1 View  Result Date: 03/06/2017 CLINICAL DATA:  Tachycardia EXAM: PORTABLE CHEST 1 VIEW COMPARISON:  March 17, 2017 FINDINGS: Port-A-Cath tip is in the superior vena cava. No pneumothorax. There is airspace consolidation in the right lower lobe with small right pleural effusion. Left lung is clear. Heart is borderline enlarged with pulmonary vascularity within normal limits. No adenopathy. No bone lesions. IMPRESSION: Airspace consolidation consistent with pneumonia right lower lung zone with small right pleural effusion. Left lung clear. Stable cardiac silhouette. Port-A-Cath tip in superior vena cava. No pneumothorax. Electronically Signed   By: Lowella Grip III M.D.   On: 03/17/2017 14:06    Cardiac Studies   N/A  Assessment   1. Active Problems: 2.   NHL (non-Hodgkin's lymphoma) ([place]) 3.   Morbid obesity with BMI of 50.0-59.9,  adult ([place]) 4.   Diffuse large B cell lymphoma ([place]) 5.   Recurrent right pleural effusion 6.   Typical atrial flutter ([place]) 7.   Sepsis ([place]) 8.   Leukocytosis 9.   Acute on chronic renal failure ([place]) 10.   Plan   1. Few options to control flutter - she has been heparinized since onset. Amiodarone has not affected rate - no room to add BB with blood pressure. Would consider 1x cardioversion attempt. D/w Family and they are in agreement. Will arrange with anesthesia  tomorrow.  Time Spent Directly with Patient:  I have spent a total of 25 minutes with the patient reviewing hospital notes, telemetry, EKGs, labs and examining the patient as well as establishing an assessment and plan that was discussed personally with the patient. > 50% of time was spent in direct patient care.  Length of Stay:  LOS: 2 days   Pixie Casino, MD, [facility] [place] Director of the Advanced Lipid Disorders &  Cardiovascular Risk Reduction Clinic Diplomate of the American Board of Clinical Lipidology Attending Cardiologist  Direct Dial: [phone]  Fax: [phone]  Website:  [email or web address] Levora Werden 04/01/2017, 8:54 AM

## 2017-04-01 NOTE — Progress Notes (Signed)
ANTICOAGULATION CONSULT NOTE - Follow Up Consult  Pharmacy Consult for heparin Indication: atrial fibrillation  No Known Allergies  Patient Measurements: Height: 5\' 1"  (154.9 cm) Weight: 254 lb 10.1 oz (115.5 kg) IBW/kg (Calculated) : 47.8 Heparin Dosing Weight: 76 kg  Vital Signs: Temp: 100.1 F (37.8 C) (03/31 0556) Temp Source: Oral (03/31 0556) BP: 94/70 (03/31 0603) Pulse Rate: 144 (03/31 0650)  Labs: Recent Labs    03/21/2017 1309 03/31/17 0513 03/31/17 1400 03/31/17 2200 04/01/17 0430  HGB 9.9* 8.5*  --   --  8.1*  HCT 30.2* 25.5*  --   --  24.1*  PLT 102* 124*  --   --  123*  HEPARINUNFRC  --  0.26* 0.44 0.40  --   CREATININE 2.39* 2.22*  --   --  2.20*    Estimated Creatinine Clearance: 29.7 mL/min (A) (by C-G formula based on SCr of 2.2 mg/dL (H)).   Assessment: Patient is a 67 y.o. F with hx lymphoma currently undergoing chemotherapy treatment presented to the ED on 03/29/2017 with afib with RVR. She's currently on heparin drip for afib.  Today, 04/01/2017: - heparin level remains therapeutic at 0.51 - hgb down slightly to 8.1, plts low but stable - no bleeding documented   Goal of Therapy:  Heparin level 0.3-0.7 units/ml Monitor platelets by anticoagulation protocol: Yes   Plan:  - Continue heparin drip at 1200 units/hr - daily heparin level - monitor for s/s bleeding - Plan for LP procedure on 04/02/17.  Please advise when heparin drip needs to be held for procedure    Dia Sitter P 04/01/2017,8:29 AM

## 2017-04-01 NOTE — Progress Notes (Signed)
ANTICOAGULATION CONSULT NOTE - Follow Up Consult  Pharmacy Consult for Heparin Indication: atrial fibrillation  No Known Allergies  Patient Measurements: Height: 5\' 1"  (154.9 cm) Weight: 254 lb 10.1 oz (115.5 kg) IBW/kg (Calculated) : 47.8 Heparin Dosing Weight:   Vital Signs: Temp: 102 F (38.9 C) (03/31 0323) Temp Source: Axillary (03/31 0323) BP: 82/58 (03/31 0300) Pulse Rate: 146 (03/31 0300)  Labs: Recent Labs    03/04/2017 1038 03/21/2017 1309 03/31/17 0513 03/31/17 1400 03/31/17 2200  HGB 9.5* 9.9* 8.5*  --   --   HCT 28.9* 30.2* 25.5*  --   --   PLT 122* 102* 124*  --   --   HEPARINUNFRC  --   --  0.26* 0.44 0.40  CREATININE 2.48* 2.39* 2.22*  --   --     Estimated Creatinine Clearance: 29.5 mL/min (A) (by C-G formula based on SCr of 2.22 mg/dL (H)).   Medications:  Infusions:  . sodium chloride 100 mL/hr at 04/01/17 0117  . amiodarone 30 mg/hr (04/01/17 0117)  . ceFEPime (MAXIPIME) IV Stopped (03/31/17 2200)  . heparin 1,200 Units/hr (04/01/17 0008)  . vancomycin      Assessment: Patient with heparin level at goal again.   Goal of Therapy:  Heparin level 0.3-0.7 units/ml Monitor platelets by anticoagulation protocol: Yes   Plan:  Continue heparin drip at current rate Recheck level at 0800  Tyler Deis, [place] 04/01/2017,4:15 AM

## 2017-04-01 NOTE — Progress Notes (Signed)
CRITICAL VALUE ALERT  Critical Value:  Lactic Acid 2.4  Date & Time Notied:  04/01/17, 5053  Provider Notified: Dr. Karleen Hampshire  Orders Received/Actions taken: Received no new orders at this time.

## 2017-04-01 NOTE — Progress Notes (Signed)
Report from [place], [place]. Care assumed for pt at this time. Pt resting in bed, very drowsy, awakens on calling but falls right back to sleep. Family at bedside. Dr Karleen Hampshire aware of elevated lactate. New order noted for 552ml NS bolus.

## 2017-04-01 NOTE — Progress Notes (Signed)
DAILY PROGRESS NOTE   Patient Name: Jo Collins Date of Encounter: 04/01/2017  Chief Complaint   Tired  Patient Profile   67 year old female with history of diffuse large B-cell lymphoma, sciatica, GERD, morbid obesity who presented with acute renal failure, pancytopenia and concern for sepsis.  We are asked to see her for the development of atrial fibrillation with rapid ventricular response.  Subjective   Remains in atrial flutter with 2:1 conduction. She is asymptomatic with this. Does not seem to be responding to rate control on amiodarone. BP will not tolerate additional medications.  Objective   Vitals:   04/01/17 0600 04/01/17 0603 04/01/17 0630 04/01/17 0650  BP:  94/70    Pulse: (!) 145 (!) 145 (!) 156 (!) 144  Resp: (!) 24 16 (!) 29 (!) 21  Temp:      TempSrc:      SpO2: 97% 97% 97% 97%  Weight:      Height:        Intake/Output Summary (Last 24 hours) at 04/01/2017 0854 Last data filed at 04/01/2017 0600 Gross per 24 hour  Intake 3231.4 ml  Output 650 ml  Net 2581.4 ml   Filed Weights   03/29/2017 1855  Weight: 254 lb 10.1 oz (115.5 kg)    Physical Exam   General appearance: asleep, no distress Neck: no carotid bruit, no JVD and thyroid not enlarged, symmetric, no tenderness/mass/nodules Lungs: diminished breath sounds bilaterally Heart: regular tachycardia Abdomen: soft, non-tender; bowel sounds normal; no masses,  no organomegaly Extremities: extremities normal, atraumatic, no cyanosis or edema Pulses: 2+ and symmetric Skin: Skin color, texture, turgor normal. No rashes or lesions Neurologic: Mental status: asleep, no distress Psych: unable to assess  Inpatient Medications    Scheduled Meds: . Chlorhexidine Gluconate Cloth  6 each Topical Daily  . sodium chloride flush  10-40 mL Intracatheter Q12H    Continuous Infusions: . sodium chloride 100 mL/hr at 04/01/17 0117  . amiodarone 30 mg/hr (04/01/17 0117)  . ceFEPime (MAXIPIME) IV Stopped  (03/31/17 2200)  . heparin 1,200 Units/hr (04/01/17 0008)  . vancomycin      PRN Meds: acetaminophen, levalbuterol, morphine injection, ondansetron (ZOFRAN) IV, sodium chloride flush, traZODone   Labs   Results for orders placed or performed during the hospital encounter of 04/01/2017 (from the past 48 hour(s))  Comprehensive metabolic panel     Status: Abnormal   Collection Time: 03/13/2017  1:09 PM  Result Value Ref Range   Sodium 141 135 - 145 mmol/L   Potassium 3.6 3.5 - 5.1 mmol/L   Chloride 103 101 - 111 mmol/L   CO2 21 (L) 22 - 32 mmol/L   Glucose, Bld 89 65 - 99 mg/dL   BUN 38 (H) 6 - 20 mg/dL   Creatinine, Ser 2.39 (H) 0.44 - 1.00 mg/dL   Calcium 8.9 8.9 - 10.3 mg/dL   Total Protein 5.5 (L) 6.5 - 8.1 g/dL   Albumin 2.8 (L) 3.5 - 5.0 g/dL   AST 47 (H) 15 - 41 U/L   ALT 25 14 - 54 U/L   Alkaline Phosphatase 105 38 - 126 U/L   Total Bilirubin 1.1 0.3 - 1.2 mg/dL   GFR calc non Af Amer 20 (L) >60 mL/min   GFR calc Af Amer 23 (L) >60 mL/min    Comment: (NOTE) The eGFR has been calculated using the CKD EPI equation. This calculation has not been validated in all clinical situations. eGFR's persistently <60 mL/min signify possible Chronic Kidney  Disease.    Anion gap 17 (H) 5 - 15    Comment: Performed at [facility], [place] [address], [place], [place] [address]  CBC WITH DIFFERENTIAL     Status: Abnormal   Collection Time: 03/23/2017  1:09 PM  Result Value Ref Range   WBC 18.5 (H) 4.0 - 10.5 K/uL   RBC 3.52 (L) 3.87 - 5.11 MIL/uL   Hemoglobin 9.9 (L) 12.0 - 15.0 g/dL   HCT 30.2 (L) 36.0 - 46.0 %   MCV 85.8 78.0 - 100.0 fL   MCH 28.1 26.0 - 34.0 pg   MCHC 32.8 30.0 - 36.0 g/dL   RDW 17.4 (H) 11.5 - 15.5 %   Platelets 102 (L) 150 - 400 K/uL    Comment: SPECIMEN CHECKED FOR CLOTS REPEATED TO VERIFY PLATELET COUNT CONFIRMED BY SMEAR    Neutrophils Relative % 76 %   Lymphocytes Relative 16 %   Monocytes Relative 8 %   Eosinophils Relative 0 %    Basophils Relative 0 %   Band Neutrophils 0 %   Metamyelocytes Relative 0 %   Myelocytes 0 %   Promyelocytes Absolute 0 %   Blasts 0 %   nRBC 2 (H) 0 /100 WBC   Other 0 %   Neutro Abs 14.0 (H) 1.7 - 7.7 K/uL   Lymphs Abs 3.0 0.7 - 4.0 K/uL   Monocytes Absolute 1.5 (H) 0.1 - 1.0 K/uL   Eosinophils Absolute 0.0 0.0 - 0.7 K/uL   Basophils Absolute 0.0 0.0 - 0.1 K/uL   RBC Morphology RARE NRBCs    WBC Morphology TOXIC GRANULATION     Comment: Performed at [facility], [place] [address], [place], [place] [address]  I-Stat CG4 Lactic Acid, ED  (not at  [facility])     Status: Abnormal   Collection Time: 03/07/2017  1:35 PM  Result Value Ref Range   Lactic Acid, Venous 3.00 (HH) 0.5 - 1.9 mmol/L   Comment NOTIFIED PHYSICIAN   Blood Culture (routine x 2)     Status: None (Preliminary result)   Collection Time: 03/20/2017  1:40 PM  Result Value Ref Range   Specimen Description      BLOOD LEFT ANTECUBITAL Performed at [place] [address], [place], [place] [address]    Special Requests      BOTTLES DRAWN AEROBIC AND ANAEROBIC Blood Culture results may not be optimal due to an inadequate volume of blood received in culture bottles Performed at [facility], [place] [address], [place], [place] [address]    Culture      NO GROWTH < 24 HOURS Performed at [place] [address] [place], [place] [address]    Report Status PENDING   I-Stat CG4 Lactic Acid, ED  (not at  [facility])     Status: Abnormal   Collection Time: 03/03/2017  3:23 PM  Result Value Ref Range   Lactic Acid, Venous 2.60 (HH) 0.5 - 1.9 mmol/L   Comment NOTIFIED PHYSICIAN   MRSA PCR Screening     Status: None   Collection Time: 03/31/2017  6:56 PM  Result Value Ref Range   MRSA by PCR NEGATIVE NEGATIVE    Comment:        The GeneXpert MRSA Assay (FDA approved for NASAL specimens only), is one component of a comprehensive MRSA colonization surveillance program. It  is not intended to diagnose MRSA infection nor to guide or monitor treatment for  MRSA infections. Performed at [facility], [place] [address], [place], [place] [address]   Blood Culture (routine x 2)     Status: None (Preliminary result)   Collection Time: 03/28/2017  7:14 PM  Result Value Ref Range   Specimen Description      BLOOD RIGHT HAND Performed at [place] [address], [place], [place] [address]    Special Requests      AEB BCLV Performed at [facility], [place] [address] [place], [place] [address]    Culture      NO GROWTH < 24 HOURS Performed at [place] [address] [place], [place] [address]    Report Status PENDING   CBC     Status: Abnormal   Collection Time: 03/31/17  5:13 AM  Result Value Ref Range   WBC 18.9 (H) 4.0 - 10.5 K/uL   RBC 3.00 (L) 3.87 - 5.11 MIL/uL   Hemoglobin 8.5 (L) 12.0 - 15.0 g/dL   HCT 25.5 (L) 36.0 - 46.0 %   MCV 85.0 78.0 - 100.0 fL   MCH 28.3 26.0 - 34.0 pg   MCHC 33.3 30.0 - 36.0 g/dL   RDW 17.9 (H) 11.5 - 15.5 %   Platelets 124 (L) 150 - 400 K/uL    Comment: Performed at [facility], [place] [address], [place], [place] [address]  Comprehensive metabolic panel     Status: Abnormal   Collection Time: 03/31/17  5:13 AM  Result Value Ref Range   Sodium 141 135 - 145 mmol/L   Potassium 3.1 (L) 3.5 - 5.1 mmol/L   Chloride 107 101 - 111 mmol/L   CO2 22 22 - 32 mmol/L   Glucose, Bld 87 65 - 99 mg/dL   BUN 30 (H) 6 - 20 mg/dL   Creatinine, Ser 2.22 (H) 0.44 - 1.00 mg/dL   Calcium 8.3 (L) 8.9 - 10.3 mg/dL   Total Protein 4.8 (L) 6.5 - 8.1 g/dL   Albumin 2.4 (L) 3.5 - 5.0 g/dL   AST 51 (H) 15 - 41 U/L   ALT 25 14 - 54 U/L   Alkaline Phosphatase 96 38 - 126 U/L   Total Bilirubin 0.8 0.3 - 1.2 mg/dL   GFR calc non Af Amer 22 (L) >60 mL/min   GFR calc Af Amer 25 (L) >60 mL/min    Comment: (NOTE) The eGFR has been calculated using the CKD EPI  equation. This calculation has not been validated in all clinical situations. eGFR's persistently <60 mL/min signify possible Chronic Kidney Disease.    Anion gap 12 5 - 15    Comment: Performed at [facility], [place] [address], [place], [place] [address]  Heparin level (unfractionated)     Status: Abnormal   Collection Time: 03/31/17  5:13 AM  Result Value Ref Range   Heparin Unfractionated 0.26 (L) 0.30 - 0.70 IU/mL    Comment:        IF HEPARIN RESULTS ARE BELOW EXPECTED VALUES, AND PATIENT DOSAGE HAS BEEN CONFIRMED, SUGGEST FOLLOW UP TESTING OF ANTITHROMBIN III LEVELS. Performed at [facility], [place] [address] [place], [place] [address]   Magnesium     Status: None   Collection Time: 03/31/17  1:30 PM  Result Value Ref Range   Magnesium 2.2 1.7 - 2.4 mg/dL    Comment: Performed at [facility], [place] [address] [place], [place] [address]  Heparin level (  unfractionated)     Status: None   Collection Time: 03/31/17  2:00 PM  Result Value Ref Range   Heparin Unfractionated 0.44 0.30 - 0.70 IU/mL    Comment:        IF HEPARIN RESULTS ARE BELOW EXPECTED VALUES, AND PATIENT DOSAGE HAS BEEN CONFIRMED, SUGGEST FOLLOW UP TESTING OF ANTITHROMBIN III LEVELS. Performed at [facility], [place] [address], [place], [place] [address]   Lactic acid, plasma     Status: Abnormal   Collection Time: 03/31/17  2:33 PM  Result Value Ref Range   Lactic Acid, Venous 3.1 (HH) 0.5 - 1.9 mmol/L    Comment: CRITICAL RESULT CALLED TO, READ BACK BY AND VERIFIED WITH: HABIB,I RN [id number] [id number] C[place],N Performed at [facility], [place] [address], [place], [place] [address]   Urinalysis, Routine w reflex microscopic     Status: Abnormal   Collection Time: 03/31/17  3:01 PM  Result Value Ref Range   Color, Urine YELLOW YELLOW   APPearance HAZY (A) CLEAR   Specific Gravity, Urine 1.020 1.005 - 1.030   pH 5.0  5.0 - 8.0   Glucose, UA NEGATIVE NEGATIVE mg/dL   Hgb urine dipstick SMALL (A) NEGATIVE   Bilirubin Urine NEGATIVE NEGATIVE   Ketones, ur 5 (A) NEGATIVE mg/dL   Protein, ur NEGATIVE NEGATIVE mg/dL   Nitrite NEGATIVE NEGATIVE   Leukocytes, UA SMALL (A) NEGATIVE   RBC / HPF 6-30 0 - 5 RBC/hpf   WBC, UA 0-5 0 - 5 WBC/hpf   Bacteria, UA RARE (A) NONE SEEN   Squamous Epithelial / LPF 0-5 (A) NONE SEEN   Mucus PRESENT    Hyaline Casts, UA PRESENT     Comment: Performed at [facility], [place] [address] [place], [place] [address]  Sodium, urine, random     Status: None   Collection Time: 03/31/17  3:01 PM  Result Value Ref Range   Sodium, Ur 17 mmol/L    Comment: Performed at [facility], [place] [address], [place], [place] [address]  Creatinine, urine, random     Status: None   Collection Time: 03/31/17  3:01 PM  Result Value Ref Range   Creatinine, Urine 176.39 mg/dL    Comment: Performed at [facility], [place] [address], [place], [place] [address]  Heparin level (unfractionated)     Status: None   Collection Time: 03/31/17 10:00 PM  Result Value Ref Range   Heparin Unfractionated 0.40 0.30 - 0.70 IU/mL    Comment:        IF HEPARIN RESULTS ARE BELOW EXPECTED VALUES, AND PATIENT DOSAGE HAS BEEN CONFIRMED, SUGGEST FOLLOW UP TESTING OF ANTITHROMBIN III LEVELS. Performed at [facility], [place] [address] [place], [place] [address]   Magnesium     Status: None   Collection Time: 04/01/17  4:30 AM  Result Value Ref Range   Magnesium 2.0 1.7 - 2.4 mg/dL    Comment: Performed at [facility], [place] [address], [place], [place] [address]  Basic metabolic panel     Status: Abnormal   Collection Time: 04/01/17  4:30 AM  Result Value Ref Range   Sodium 141 135 - 145 mmol/L   Potassium 4.4 3.5 - 5.1 mmol/L    Comment: DELTA CHECK NOTED NO VISIBLE HEMOLYSIS    Chloride 110 101 - 111 mmol/L   CO2  20 (L) 22 - 32 mmol/L   Glucose, Bld 87 65 -  99 mg/dL   BUN 28 (H) 6 - 20 mg/dL   Creatinine, Ser 2.20 (H) 0.44 - 1.00 mg/dL   Calcium 8.6 (L) 8.9 - 10.3 mg/dL   GFR calc non Af Amer 22 (L) >60 mL/min   GFR calc Af Amer 26 (L) >60 mL/min    Comment: (NOTE) The eGFR has been calculated using the CKD EPI equation. This calculation has not been validated in all clinical situations. eGFR's persistently <60 mL/min signify possible Chronic Kidney Disease.    Anion gap 11 5 - 15    Comment: Performed at [facility], [place] [address] [place], [place] [address]  CBC     Status: Abnormal   Collection Time: 04/01/17  4:30 AM  Result Value Ref Range   WBC 25.2 (H) 4.0 - 10.5 K/uL   RBC 2.87 (L) 3.87 - 5.11 MIL/uL   Hemoglobin 8.1 (L) 12.0 - 15.0 g/dL   HCT 24.1 (L) 36.0 - 46.0 %   MCV 84.0 78.0 - 100.0 fL   MCH 28.2 26.0 - 34.0 pg   MCHC 33.6 30.0 - 36.0 g/dL   RDW 18.7 (H) 11.5 - 15.5 %   Platelets 123 (L) 150 - 400 K/uL    Comment: Performed at [facility], [place] [address] [place], [place] [address]    ECG   N/A  Telemetry   Atrial flutter with 2:1 AVB- Personally Reviewed  Radiology    Ct Head Wo Contrast  Result Date: 03/15/2017 CLINICAL DATA:  Double vision. EXAM: CT HEAD WITHOUT CONTRAST TECHNIQUE: Contiguous axial images were obtained from the base of the skull through the vertex without intravenous contrast. COMPARISON:  MRI of February 06, 2017. FINDINGS: Brain: Mild chronic ischemic white matter disease is noted. No mass effect or midline shift is noted. Ventricular size is within normal limits. There is no evidence of mass lesion, hemorrhage or acute infarction. Vascular: No hyperdense vessel or unexpected calcification. Skull: Normal. Negative for fracture or focal lesion. Sinuses/Orbits: No acute finding. Other: None. IMPRESSION: Mild chronic ischemic white matter disease. No acute intracranial abnormality seen. Electronically Signed    By: Marijo Conception, M.D.   On: 03/27/2017 15:01   US Renal  Result Date: 04/01/2017 CLINICAL DATA:  Acute renal failure. EXAM: RENAL / URINARY TRACT ULTRASOUND COMPLETE COMPARISON:  None. FINDINGS: Right Kidney: Length: 10.6 cm. Echogenicity within normal limits. No mass or hydronephrosis visualized. Left Kidney: Length: 11.3 cm. Echogenicity within normal limits. No mass or hydronephrosis visualized. Bladder: Appears normal for degree of bladder distention. IMPRESSION: Normal renal ultrasound. Electronically Signed   By: Fidela Salisbury M.D.   On: 04/01/2017 01:21   Dg Chest Port 1 View  Result Date: 03/04/2017 CLINICAL DATA:  Tachycardia EXAM: PORTABLE CHEST 1 VIEW COMPARISON:  March 17, 2017 FINDINGS: Port-A-Cath tip is in the superior vena cava. No pneumothorax. There is airspace consolidation in the right lower lobe with small right pleural effusion. Left lung is clear. Heart is borderline enlarged with pulmonary vascularity within normal limits. No adenopathy. No bone lesions. IMPRESSION: Airspace consolidation consistent with pneumonia right lower lung zone with small right pleural effusion. Left lung clear. Stable cardiac silhouette. Port-A-Cath tip in superior vena cava. No pneumothorax. Electronically Signed   By: Lowella Grip III M.D.   On: 03/21/2017 14:06    Cardiac Studies   N/A  Assessment   1. Active Problems: 2.   NHL (non-Hodgkin's lymphoma) ([place]) 3.   Morbid obesity with BMI of 50.0-59.9,  adult ([place]) 4.   Diffuse large B cell lymphoma ([place]) 5.   Recurrent right pleural effusion 6.   Typical atrial flutter ([place]) 7.   Sepsis ([place]) 8.   Leukocytosis 9.   Acute on chronic renal failure ([place]) 10.   Plan   1. Few options to control flutter - she has been heparinized since onset. Amiodarone has not affected rate - no room to add BB with blood pressure. Would consider 1x cardioversion attempt. D/w Family and they are in agreement. Will arrange with anesthesia  tomorrow.  Time Spent Directly with Patient:  I have spent a total of 25 minutes with the patient reviewing hospital notes, telemetry, EKGs, labs and examining the patient as well as establishing an assessment and plan that was discussed personally with the patient. > 50% of time was spent in direct patient care.  Length of Stay:  LOS: 2 days   Pixie Casino, MD, [facility], [place] Director of the [facility]  Cardiovascular Risk Reduction [facility] Diplomate of the American Board of Clinical Lipidology Attending Cardiologist  Direct Dial: [phone]  Fax: [phone]  Website:  [email or web address] Richard [email or web address] 04/01/2017, 8:54 AM

## 2017-04-02 ENCOUNTER — Inpatient Hospital Stay (HOSPITAL_COMMUNITY): Payer: Medicare Other | Admitting: Anesthesiology

## 2017-04-02 ENCOUNTER — Inpatient Hospital Stay (HOSPITAL_COMMUNITY): Payer: Medicare Other

## 2017-04-02 ENCOUNTER — Encounter (HOSPITAL_COMMUNITY): Admission: EM | Disposition: E | Payer: Self-pay | Source: Home / Self Care | Attending: Internal Medicine

## 2017-04-02 ENCOUNTER — Encounter (HOSPITAL_COMMUNITY): Payer: Self-pay | Admitting: Certified Registered Nurse Anesthetist

## 2017-04-02 DIAGNOSIS — M79672 Pain in left foot: Secondary | ICD-10-CM

## 2017-04-02 DIAGNOSIS — K219 Gastro-esophageal reflux disease without esophagitis: Secondary | ICD-10-CM

## 2017-04-02 DIAGNOSIS — Z7189 Other specified counseling: Secondary | ICD-10-CM

## 2017-04-02 DIAGNOSIS — H4901 Third [oculomotor] nerve palsy, right eye: Secondary | ICD-10-CM

## 2017-04-02 DIAGNOSIS — F17211 Nicotine dependence, cigarettes, in remission: Secondary | ICD-10-CM

## 2017-04-02 DIAGNOSIS — D630 Anemia in neoplastic disease: Secondary | ICD-10-CM

## 2017-04-02 DIAGNOSIS — E790 Hyperuricemia without signs of inflammatory arthritis and tophaceous disease: Secondary | ICD-10-CM

## 2017-04-02 DIAGNOSIS — I517 Cardiomegaly: Secondary | ICD-10-CM

## 2017-04-02 DIAGNOSIS — C8590 Non-Hodgkin lymphoma, unspecified, unspecified site: Secondary | ICD-10-CM

## 2017-04-02 DIAGNOSIS — M549 Dorsalgia, unspecified: Secondary | ICD-10-CM

## 2017-04-02 DIAGNOSIS — G510 Bell's palsy: Secondary | ICD-10-CM

## 2017-04-02 HISTORY — PX: CARDIOVERSION: SHX1299

## 2017-04-02 LAB — DIFFERENTIAL
BASOS ABS: 0 10*3/uL (ref 0.0–0.1)
BLASTS: 0 %
Basophils Relative: 0 %
EOS ABS: 0 10*3/uL (ref 0.0–0.7)
Eosinophils Relative: 0 %
Lymphocytes Relative: 9 %
Lymphs Abs: 3 10*3/uL (ref 0.7–4.0)
Metamyelocytes Relative: 4 %
Monocytes Absolute: 3 10*3/uL — ABNORMAL HIGH (ref 0.1–1.0)
Monocytes Relative: 9 %
Myelocytes: 3 %
Neutro Abs: 25.4 10*3/uL — ABNORMAL HIGH (ref 1.7–7.7)
Neutrophils Relative %: 70 %
Other: 5 %
PROMYELOCYTES ABS: 0 %

## 2017-04-02 LAB — CBC
HCT: 23.1 % — ABNORMAL LOW (ref 36.0–46.0)
Hemoglobin: 7.7 g/dL — ABNORMAL LOW (ref 12.0–15.0)
MCH: 27.7 pg (ref 26.0–34.0)
MCHC: 33.3 g/dL (ref 30.0–36.0)
MCV: 83.1 fL (ref 78.0–100.0)
PLATELETS: 124 10*3/uL — AB (ref 150–400)
RBC: 2.78 MIL/uL — ABNORMAL LOW (ref 3.87–5.11)
RDW: 19.6 % — AB (ref 11.5–15.5)
WBC: 33 10*3/uL — ABNORMAL HIGH (ref 4.0–10.5)

## 2017-04-02 LAB — BASIC METABOLIC PANEL
Anion gap: 11 (ref 5–15)
BUN: 27 mg/dL — ABNORMAL HIGH (ref 6–20)
CALCIUM: 8.3 mg/dL — AB (ref 8.9–10.3)
CO2: 18 mmol/L — AB (ref 22–32)
CREATININE: 2.04 mg/dL — AB (ref 0.44–1.00)
Chloride: 111 mmol/L (ref 101–111)
GFR calc Af Amer: 28 mL/min — ABNORMAL LOW (ref >60)
GFR, EST NON AFRICAN AMERICAN: 24 mL/min — AB (ref >60)
Glucose, Bld: 128 mg/dL — ABNORMAL HIGH (ref 65–99)
Potassium: 4.3 mmol/L (ref 3.5–5.1)
SODIUM: 140 mmol/L (ref 135–145)

## 2017-04-02 LAB — HEPARIN LEVEL (UNFRACTIONATED): HEPARIN UNFRACTIONATED: 0.61 [IU]/mL (ref 0.30–0.70)

## 2017-04-02 LAB — URIC ACID: URIC ACID, SERUM: 7.9 mg/dL — AB (ref 2.3–6.6)

## 2017-04-02 SURGERY — CARDIOVERSION
Anesthesia: General

## 2017-04-02 MED ORDER — SIMETHICONE 40 MG/0.6ML PO SUSP
40.0000 mg | Freq: Once | ORAL | Status: AC
  Administered 2017-04-02: 40 mg via ORAL
  Filled 2017-04-02: qty 0.6

## 2017-04-02 MED ORDER — PROMETHAZINE HCL 25 MG/ML IJ SOLN: 6.2500 mg | INTRAMUSCULAR | Status: DC | PRN

## 2017-04-02 MED ORDER — PROPOFOL 10 MG/ML IV BOLUS
INTRAVENOUS | Status: AC
Start: 2017-04-02 — End: ?
  Filled 2017-04-02: qty 20

## 2017-04-02 MED ORDER — SODIUM CHLORIDE 0.9 % IV SOLN: 250.0000 mL | INTRAVENOUS | Status: DC

## 2017-04-02 MED ORDER — SODIUM CHLORIDE 0.9% FLUSH
3.0000 mL | Freq: Two times a day (BID) | INTRAVENOUS | Status: DC
  Administered 2017-04-02 – 2017-04-03 (×2): 3 mL via INTRAVENOUS

## 2017-04-02 MED ORDER — PROPOFOL 10 MG/ML IV BOLUS
INTRAVENOUS | Status: AC
  Filled 2017-04-02: qty 20

## 2017-04-02 MED ORDER — PROPOFOL 10 MG/ML IV BOLUS
INTRAVENOUS | Status: DC | PRN
  Administered 2017-04-02: 40 mg via INTRAVENOUS
  Administered 2017-04-02: 70 mg via INTRAVENOUS
  Administered 2017-04-02: 30 mg via INTRAVENOUS
  Administered 2017-04-02: 50 mg via INTRAVENOUS
  Administered 2017-04-02: 40 mg via INTRAVENOUS

## 2017-04-02 MED ORDER — LIDOCAINE 2% (20 MG/ML) 5 ML SYRINGE
INTRAMUSCULAR | Status: AC
  Filled 2017-04-02: qty 5

## 2017-04-02 MED ORDER — PHENYLEPHRINE 40 MCG/ML (10ML) SYRINGE FOR IV PUSH (FOR BLOOD PRESSURE SUPPORT)
PREFILLED_SYRINGE | INTRAVENOUS | Status: AC
  Filled 2017-04-02: qty 10

## 2017-04-02 MED ORDER — PHENYLEPHRINE 40 MCG/ML (10ML) SYRINGE FOR IV PUSH (FOR BLOOD PRESSURE SUPPORT)
PREFILLED_SYRINGE | INTRAVENOUS | Status: DC | PRN
  Administered 2017-04-02: 40 ug via INTRAVENOUS

## 2017-04-02 MED ORDER — AMIODARONE HCL 200 MG PO TABS
200.0000 mg | ORAL_TABLET | Freq: Two times a day (BID) | ORAL | Status: DC
  Administered 2017-04-02 – 2017-04-03 (×3): 200 mg via ORAL
  Filled 2017-04-02 (×3): qty 1

## 2017-04-02 MED ORDER — ENSURE ENLIVE PO LIQD
237.0000 mL | Freq: Two times a day (BID) | ORAL | Status: DC
  Administered 2017-04-02 – 2017-04-05 (×2): 237 mL via ORAL

## 2017-04-02 MED ORDER — LIDOCAINE 2% (20 MG/ML) 5 ML SYRINGE
INTRAMUSCULAR | Status: DC | PRN
  Administered 2017-04-02: 100 mg via INTRAVENOUS

## 2017-04-02 MED ORDER — SODIUM CHLORIDE 0.9% FLUSH: 3.0000 mL | INTRAVENOUS | Status: DC | PRN

## 2017-04-02 NOTE — Anesthesia Postprocedure Evaluation (Signed)
Anesthesia Post Note  Patient: Jo Collins  Procedure(s) Performed: CARDIOVERSION (N/A )     Patient location during evaluation: PACU Anesthesia Type: General Level of consciousness: awake and alert Pain management: pain level controlled Vital Signs Assessment: post-procedure vital signs reviewed and stable Respiratory status: spontaneous breathing, nonlabored ventilation, respiratory function stable and patient connected to nasal cannula oxygen Cardiovascular status: blood pressure returned to baseline and stable Postop Assessment: no apparent nausea or vomiting Anesthetic complications: no    Last Vitals:  Vitals:   04/30/2017 1230 04/20/2017 1245  BP: 100/79 120/81  Pulse: 71 76  Resp: 19 (!) 29  Temp:  36.9 C  SpO2: 100% 100%    Last Pain:  Vitals:   04/21/2017 1245  TempSrc:   PainSc: 0-No pain                 Bransyn Adami S

## 2017-04-02 NOTE — Anesthesia Preprocedure Evaluation (Signed)
Anesthesia Evaluation  Patient identified by MRN, date of birth, ID band Patient awake    Reviewed: Allergy & Precautions, NPO status , Patient's Chart, lab work & pertinent test results  Airway Mallampati: II  TM Distance: >3 FB Neck ROM: Full    Dental no notable dental hx.    Pulmonary neg pulmonary ROS, former smoker,    breath sounds clear to auscultation + decreased breath sounds      Cardiovascular negative cardio ROS   Rhythm:Regular Rate:Tachycardia  EF > 60%   Neuro/Psych negative neurological ROS  negative psych ROS   GI/Hepatic negative GI ROS, Neg liver ROS,   Endo/Other  Morbid obesity  Renal/GU negative Renal ROS  negative genitourinary   Musculoskeletal negative musculoskeletal ROS (+)   Abdominal   Peds negative pediatric ROS (+)  Hematology NHL   Anesthesia Other Findings   Reproductive/Obstetrics negative OB ROS                             Anesthesia Physical Anesthesia Plan  ASA: III  Anesthesia Plan: General   Post-op Pain Management:    Induction: Intravenous  PONV Risk Score and Plan: 0  Airway Management Planned: Mask  Additional Equipment:   Intra-op Plan:   Post-operative Plan:   Informed Consent: I have reviewed the patients History and Physical, chart, labs and discussed the procedure including the risks, benefits and alternatives for the proposed anesthesia with the patient or authorized representative who has indicated his/her understanding and acceptance.   Dental advisory given  Plan Discussed with: CRNA and Surgeon  Anesthesia Plan Comments:         Anesthesia Quick Evaluation

## 2017-04-02 NOTE — Progress Notes (Signed)
HEMATOLOGY/ONCOLOGY INPATIENT PROGRESS NOTE  Date of Service: 04/25/2017  Inpatient Attending: .Hosie Poisson, MD   SUBJECTIVE:  [name] was seen in the MICU at [facility]. She was accompanied by her husband and sister at this time. She notes that her back is not too painful. She denies seeing double at this time. She notes that "food and chemotherapy are not working for me". She notes that when she eats, it doesn't taste right. She notes that she is sometimes able to drink her boost/ensure drink but she is trying to eat all of her food.  She notes that the numbness in her jaw has gotten worse.  She has a cardioversion today for Pafib with RVR and went into SR was later was back in a fib.  On review of symptoms she reports significant discomfort and denies SOB, headaches, chest pain, coughing up phlegm, abdominal pains, and any other symptoms.   OBJECTIVE:  NAD, somewhat sleepy from sedation for DCCV.   PHYSICAL EXAMINATION: . Vitals:   04/11/2017 1230 04/07/2017 1245 04/22/2017 1300 04/25/2017 1400  BP: 100/79 120/81 (!) 147/83 119/64  Pulse: 71 76 79 80  Resp: 19 (!) 29 21 23   Temp:  98.4 F (36.9 C)    TempSrc:      SpO2: 100% 100% 98% 99%  Weight:      Height:       Filed Weights   03/24/2017 1855  Weight: 254 lb 10.1 oz (115.5 kg)   .Body mass index is 48.11 kg/m.  GENERAL:lethargic but arousable. EYES: puffy eyes OROPHARYNX: no exudate NECK: supple LYMPH:  no palpable lymphadenopathy in the cervical, axillary LUNGS: clear to auscultation distant Breath sounds HEART: tachy S1S2, irreg ABDOMEN: abdomen obese, soft, distant breath sounds Musculoskeletal: 2+pedal edema b/l NEURO: no focal motor/sensory deficits, ? Left foot pain/ ?weakness.  MEDICAL HISTORY:  Past Medical History:  Diagnosis Date  . Abdominal pain, RUQ   . Abnormal EKG 08/07/2016  . Acute midline thoracic back pain   . Acute respiratory distress 09/25/2016  . Anemia 08/07/2016  . Chronic low back pain  09/25/2016  . Cough   . DLBCL (diffuse large B cell lymphoma) ([place]) 08/17/2016  . GERD (gastroesophageal reflux disease)   . History of chemotherapy   . Hypokalemia 09/25/2016  . 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 Lymphadenopathy   . Morbid obesity with BMI of 50.0-59.9, adult ([place])   . Neutrophilic leukocytosis 33/08/2503  . NHL (non-Hodgkin's lymphoma) ([place])   . Pleural effusion on right 08/07/2016  . Recurrent pleural effusion on right 10/08/2016  . Recurrent right pleural effusion 09/25/2016  . S/P thoracentesis   . Shortness of breath 08/07/2016  . SOB (shortness of breath) 09/26/2016    SURGICAL HISTORY: Past Surgical History:  Procedure Laterality Date  . ABDOMINAL HYSTERECTOMY    . IR CV LINE INJECTION  10/26/2016  . IR FLUORO GUIDE PORT INSERTION RIGHT  08/18/2016  . IR FLUORO GUIDE PORT INSERTION RIGHT  11/03/2016  . IR REMOVAL TUN CV CATH W/O FL  11/03/2016  . IR THORACENTESIS ASP PLEURAL SPACE W/IMG GUIDE  08/07/2016  . IR THORACENTESIS ASP PLEURAL SPACE W/IMG GUIDE  08/14/2016  . IR US GUIDE VASC ACCESS RIGHT  08/18/2016  . IR US GUIDE VASC ACCESS RIGHT  11/03/2016    SOCIAL HISTORY: Social History   Socioeconomic History  . Marital status: Married    Spouse name: Not on file  . Number of children: Not on file  . Years of education:  Not on file  . Highest education level: Not on file  Occupational History  . Not on file  Social Needs  . Financial resource strain: Not hard at all  . Food insecurity:    Worry: Never true    Inability: Never true  . Transportation needs:    Medical: No    Non-medical: No  Tobacco Use  . Smoking status: Former Smoker    Packs/day: 0.50    Years: 47.00    Pack years: 23.50    Types: Cigarettes    Last attempt to quit: 07/28/2016    Years since quitting: 0.6  . Smokeless tobacco: Never Used  Substance and Sexual Activity  . Alcohol use: No  . Drug use: No  . Sexual activity: Not on file  Lifestyle  . Physical activity:    Days per  week: Patient refused    Minutes per session: Patient refused  . Stress: To some extent  Relationships  . Social connections:    Talks on phone: Not on file    Gets together: Not on file    Attends religious service: Not on file    Active member of club or organization: Not on file    Attends meetings of clubs or organizations: Not on file    Relationship status: Not on file  . Intimate partner violence:    Fear of current or ex partner: Not on file    Emotionally abused: Not on file    Physically abused: Not on file    Forced sexual activity: Not on file  Other Topics Concern  . Not on file  Social History Narrative  . Not on file    FAMILY HISTORY: Family History  Problem Relation Age of Onset  . COPD Mother   . Diabetes Mellitus II Sister   . Diabetes Mellitus II Brother   . Diabetes Mellitus II Sister   . Stroke Sister   . Hypertension Sister   . Diabetes Mellitus II Sister     ALLERGIES:  has No Known Allergies.  MEDICATIONS:  Scheduled Meds: . amiodarone  200 mg Oral BID  . Chlorhexidine Gluconate Cloth  6 each Topical Daily  . hydrocortisone sod succinate (SOLU-CORTEF) inj  100 mg Intravenous Q8H  . sodium chloride flush  10-40 mL Intracatheter Q12H  . sodium chloride flush  3 mL Intravenous Q12H  . sodium chloride flush  3 mL Intravenous Q12H   Continuous Infusions: . sodium chloride 100 mL/hr at 04/27/2017 0827  . sodium chloride    . sodium chloride    . ceFEPime (MAXIPIME) IV Stopped (04/08/2017 1010)  . heparin 1,200 Units/hr (04/22/2017 0931)  . vancomycin Stopped (04/01/17 1652)   PRN Meds:.acetaminophen, levalbuterol, morphine injection, ondansetron (ZOFRAN) IV, promethazine, sodium chloride flush, sodium chloride flush, sodium chloride flush, traZODone  REVIEW OF SYSTEMS:    10 Point review of Systems was done is negative except as noted above.   LABORATORY DATA:  I have reviewed the data as listed  . CBC Latest Ref Rng & Units 04/23/2017  04/01/2017 03/31/2017  WBC 4.0 - 10.5 K/uL 33.0(H) 25.2(H) 18.9(H)  Hemoglobin 12.0 - 15.0 g/dL 7.7(L) 8.1(L) 8.5(L)  Hematocrit 36.0 - 46.0 % 23.1(L) 24.1(L) 25.5(L)  Platelets 150 - 400 K/uL 124(L) 123(L) 124(L)    . CMP Latest Ref Rng & Units 05/01/2017 04/01/2017 03/31/2017  Glucose 65 - 99 mg/dL 128(H) 87 87  BUN 6 - 20 mg/dL 27(H) 28(H) 30(H)  Creatinine 0.44 - 1.00 mg/dL 2.04(H)  2.20(H) 2.22(H)  Sodium 135 - 145 mmol/L 140 141 141  Potassium 3.5 - 5.1 mmol/L 4.3 4.4 3.1(L)  Chloride 101 - 111 mmol/L 111 110 107  CO2 22 - 32 mmol/L 18(L) 20(L) 22  Calcium 8.9 - 10.3 mg/dL 8.3(L) 8.6(L) 8.3(L)  Total Protein 6.5 - 8.1 g/dL - - 4.8(L)  Total Bilirubin 0.3 - 1.2 mg/dL - - 0.8  Alkaline Phos 38 - 126 U/L - - 96  AST 15 - 41 U/L - - 51(H)  ALT 14 - 54 U/L - - 25     RADIOGRAPHIC STUDIES: I have personally reviewed the radiological images as listed and agreed with the findings in the report. Ct Head Wo Contrast  Result Date: 03/24/2017 CLINICAL DATA:  Double vision. EXAM: CT HEAD WITHOUT CONTRAST TECHNIQUE: Contiguous axial images were obtained from the base of the skull through the vertex without intravenous contrast. COMPARISON:  MRI of February 06, 2017. FINDINGS: Brain: Mild chronic ischemic white matter disease is noted. No mass effect or midline shift is noted. Ventricular size is within normal limits. There is no evidence of mass lesion, hemorrhage or acute infarction. Vascular: No hyperdense vessel or unexpected calcification. Skull: Normal. Negative for fracture or focal lesion. Sinuses/Orbits: No acute finding. Other: None. IMPRESSION: Mild chronic ischemic white matter disease. No acute intracranial abnormality seen. Electronically Signed   By: Marijo Conception, M.D.   On: 03/09/2017 15:01   US Renal  Result Date: 04/01/2017 CLINICAL DATA:  Acute renal failure. EXAM: RENAL / URINARY TRACT ULTRASOUND COMPLETE COMPARISON:  None. FINDINGS: Right Kidney: Length: 10.6 cm. Echogenicity  within normal limits. No mass or hydronephrosis visualized. Left Kidney: Length: 11.3 cm. Echogenicity within normal limits. No mass or hydronephrosis visualized. Bladder: Appears normal for degree of bladder distention. IMPRESSION: Normal renal ultrasound. Electronically Signed   By: Fidela Salisbury M.D.   On: 04/01/2017 01:21   Dg Chest Port 1 View  Result Date: 03/15/2017 CLINICAL DATA:  Tachycardia EXAM: PORTABLE CHEST 1 VIEW COMPARISON:  March 17, 2017 FINDINGS: Port-A-Cath tip is in the superior vena cava. No pneumothorax. There is airspace consolidation in the right lower lobe with small right pleural effusion. Left lung is clear. Heart is borderline enlarged with pulmonary vascularity within normal limits. No adenopathy. No bone lesions. IMPRESSION: Airspace consolidation consistent with pneumonia right lower lung zone with small right pleural effusion. Left lung clear. Stable cardiac silhouette. Port-A-Cath tip in superior vena cava. No pneumothorax. Electronically Signed   By: Lowella Grip III M.D.   On: 03/26/2017 14:06   Dg Chest Port 1 View  Result Date: 03/17/2017 CLINICAL DATA:  SOB today EXAM: PORTABLE CHEST 1 VIEW COMPARISON:  CT 02/06/2017 FINDINGS: Port in the anterior chest wall with tip in distal SVC. Normal cardiac silhouette. There is RIGHT pleural effusion and atelectasis. Upper lungs clear. No pneumothorax. IMPRESSION: 1. Persistent RIGHT pleural effusion and RIGHT basilar scarring. Chronic findings. 2. LEFT lung clear Electronically Signed   By: Suzy Bouchard M.D.   On: 03/17/2017 07:37   Dg Foot Complete Left  Result Date: 03/17/2017 CLINICAL DATA:  67 year old female with left foot pain for 1 month. Plantar and lateral aspect pain with no known injury. EXAM: LEFT FOOT - COMPLETE 3+ VIEW COMPARISON:  None. FINDINGS: Degenerative spurring of the plantar and dorsal calcaneus. Dorsal degenerative spurring throughout the tarsal bones, including dystrophic calcification  or spurring along the medial anterior talus. Accessory ossicle adjacent to the cuboid. The metatarsals are intact. There is mild  joint space loss and degenerative subchondral sclerosis at the 1st MTP and 1st IP joints. There is chronic resorption of the head of the 5th proximal phalanx. The 5th middle and distal phalanges appear normal. No acute osseous abnormality identified. Generalized soft tissue swelling. No subcutaneous gas. IMPRESSION: Chronic and degenerative osseous changes throughout the left foot. No acute osseous abnormality identified. Generalized soft tissue swelling. Electronically Signed   By: Genevie Ann M.D.   On: 03/17/2017 15:40    ASSESSMENT & PLAN:   67 y.o. with GERD, morbid obesity, likely sleep apnea, smoker recently quit with  #1 Relapsed Refractory DLBCL - now with chest wall mass and increasing pelvic lymphadenopathy and progressively increasing LDH levels and new anemia and thrombocytopenia with  BM Mx showing extensive involvement with relapsed/refractory DLBCL and PET/CT with chest wall mass. 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  #4 s/p Severe thrombocytopenia PLT 25k BM involvement by lymphoma and chemotherapy - Platelets improved to 122k today.  #5 Acute renal failure -worsened from previous admission.?sepsis + TLS   #6 RT occular swelling/diplopia and occulomotor palsy with increasing chin numbness--concerning for rt orbital lymphoma involvement and possible CNS involvement.  #7 Increased back pain  and left foot pain and?weakness --concern for possible lymphoma  Plan: -labs and clinical status reviewed with the patient in details including concerns for lymphoma progression. -Uric acid  down from9.5 to 7.4 with IVF-- allopurinol 100mg po BID for hyperuricemia from?tumor lysis. -IVF per fluid status -on IV Abx for empiric coverage of pneumonia -Pending MRI brain and thoraco-lumbar spine to evaluate for lymphoma involvement  -LP to evaluate for meningitis and lymphoma -will continue to followup. -given grave condition - discussed with patient and her husband regarding concerning prognosis. Suggest to husband that close family who would like to meet her should visit.  I spent 30 minutes counseling the patient face to face. The total time spent in the appointment was 35 minutes and more than 50% was on counseling and direct patient cares.    Sullivan Lone MD [place] AAHIVMS [facility] Hematology/Oncology Physician [facility]  (Office):       [phone] (Work cell):  [phone] (Fax):           [phone]  04/09/2017 3:34 PM  This document serves as a record of services personally performed by Sullivan Lone, MD. It was created on his behalf by Baldwin [place], a trained medical scribe. The creation of this record is based on the scribe's personal observations and the provider's statements to them.   .I have reviewed the above documentation for accuracy and completeness, and I agree with the above. Sullivan Lone MD MS

## 2017-04-02 NOTE — Progress Notes (Signed)
Subjective: Currently awake.  States that she has no double vision.  Husband who is in the room states that her left eye does not seem that it has as much ptosis.  No complaints at this time  Exam: Vitals:   04/23/2017 0800 05/01/2017 0900  BP: 94/72 120/77  Pulse: (!) 145 (!) 138  Resp: (!) 28 17  Temp:    SpO2: 100% 97%    Physical Exam   HEENT-  Normocephalic, no lesions, without obvious abnormality.  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  Saturations within normal limits Abdomen- All 4 quadrants palpated and nontender Extremities- Warm, dry and intact Musculoskeletal-no joint tenderness, deformity or swelling Skin-warm and dry, no hyperpigmentation, vitiligo, or suspicious lesions    Neuro:  Mental Status: Alert, oriented, thought content appropriate.  Speech fluent without evidence of aphasia.  Able to follow 3 step commands without difficulty. Cranial Nerves: II: Visual fields grossly normal,  III,IV, VI: ptosis present bilaterally with right greater than left, extra-ocular motions shows a 3rd nerve palsy on the left and normal motions in all quadrants on the right. pupils equal, round, reactive to light and accommodation V,VII: smile symmetric, facial light touch sensation normal bilaterally VIII: hearing normal bilaterally IX,X: uvula rises symmetrically XI: bilateral shoulder shrug XII: midline tongue extension Motor: Right : Upper extremity   5/5    Left:     Upper extremity   5/5  Lower extremity   3/5     Lower extremity   3/5 --As stated prior patient has a dropfoot on her left side.  She is unable to dorsiflex, invert, evert with minimal plantarflexion Tone and bulk:normal tone throughout; no atrophy noted Sensory: Pinprick and light touch intact throughout, bilaterally Deep Tendon Reflexes: Minimal  throughout Plantars: Right: downgoing   Left: downgoing Cerebellar: normal finger-to-nose,  Gait: Tested    Medications:  Scheduled: . Chlorhexidine Gluconate Cloth  6 each Topical Daily  . hydrocortisone sod succinate (SOLU-CORTEF) inj  100 mg Intravenous Q8H  . sodium chloride flush  10-40 mL Intracatheter Q12H  . sodium chloride flush  3 mL Intravenous Q12H   Continuous: . sodium chloride 100 mL/hr at 04/09/2017 0827  . sodium chloride    . amiodarone 30 mg/hr (04/11/2017 0800)  . ceFEPime (MAXIPIME) IV Stopped (04/01/17 2302)  . heparin Stopped (04/26/2017 0843)  . vancomycin Stopped (04/01/17 1652)    Pertinent Labs/Diagnostics:   US Renal  Result Date: 04/01/2017 CLINICAL DATA:  Acute renal failure. EXAM: RENAL / URINARY TRACT ULTRASOUND COMPLETE COMPARISON:  None. FINDINGS: Right Kidney: Length: 10.6 cm. Echogenicity within normal limits. No mass or hydronephrosis visualized. Left Kidney: Length: 11.3 cm. Echogenicity within normal limits. No mass or hydronephrosis visualized. Bladder: Appears normal for degree of bladder distention. IMPRESSION: Normal renal ultrasound. Electronically Signed   By: Fidela Salisbury M.D.   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   Bilateral lower extremity weakness with left drop foot: "Most likely secondary to compromise of nerve roots supplying the LLE within the cauda equina, versus a left lumbosacral plexopathy. MRI lumbar spine obtained in Feb revealed no nerve root compression at the level of the foramina. Lack of contrast on prior L-spine  MRI noted, and lymphomatous infiltration of nerve roots could easily be missed. "     Recommendations: --MRI brain and orbits to assess for possible lymphoma Maisha's infiltration in the region of the  right cranial nerve III.  This will have to be done without contrast secondary to renal function --MRA of head to assess for possible aneurysm or mass-effect on the right 3rd nerve --MRV to assess for possible venous sinus thrombosis --Will need lumbar puncture to assess for possible CNS lymphoma. Most important labs to obtain would be flow cytometry and cytology, each requiring at least 5 cc of fresh CSF sample. These tests must be performed within 3 hours of LP in order to avoid sample degradation, which could result in a false negative. LP will need to be scheduled in the AM on a weekday to allow enough time for sample to be run during normal pathology shift lab hours. Also will need cell count with differential, protein, glucose, bacterial and fungal stain, bacterial and fungal culture. Will need fluoro guided LP due to patient's morbid obesity and spondylosis seen on recent MRI L-spine.  -- Neurology team will follow with you.       04/21/2017, 9:17 AM

## 2017-04-02 NOTE — Progress Notes (Signed)
Chaplain responding to request for support by RN.  Pt with several procedures today.   Provided support at bedside.  Pt expressed fear and uncertainty about procedures.  Stated she relies on her faith - describing her life as in god's hands.  Pt had many visitors this morning in preparation for procedures and seemed intermittently confused in conversation with chaplain.  Chaplain had to re-introduce role three times.  Pt has support from family at bedside.   Shared prayers with pt at bedside.

## 2017-04-02 NOTE — Progress Notes (Signed)
PROGRESS NOTE    Jo Collins  IRW:[id number] DOB: Sep 25, 1950 DOA: 03/03/2017 PCP: Deland Pretty, MD   Brief Narrative:  Jo Collins is a 67 y.o. female with multiple medical problems including paroxysmal atrial fibrillation not on anticoagulation, large B cell lymphoma, ongoing chemotherapy with recent tumor lysis syndrome resulting in acute renal failure, anemia and hospitalization, prior biopsy of the right iliac crest in January and ongoing issues with low back pain with radiation down to the left leg with weakness and numbness who was seen last week in the emergency department for complaints of leg pain, diplopia. She was admitted for afib with RVR and possible sepsis from pneumonia.     Assessment & Plan:   Active Problems:   NHL (non-Hodgkin's lymphoma) (HCC)   Morbid obesity with BMI of 50.0-59.9, adult (HCC)   Diffuse large B cell lymphoma (HCC)   Recurrent right pleural effusion   Atrial flutter (HCC)   Sepsis (HCC)   Leukocytosis   Acute on chronic renal failure (HCC)   HCAP (healthcare-associated pneumonia)   Relapsed diffuse B cell Lymphoma:  Further management as per Dr Irene Limbo.    Atrial flutter  with RVR:  Currently on IV amiodarone  Without much improvement, IV heparin for anti coagulation.  appreciate cardiology recommendations.  Plan for cardioversion this afternoon.     ARF: ? Prerenal vs tumour lysis syndrome.  Baseline creatinine less than 1.  Fluid resuscitation and repeat renal parameters in am does not show much improvement.  FeNa is 0.2.  UA is negative for infection and US renal is negative for hydronephrosis.  Creatinine slowly improving, at 2 today.    Right eye swelling, diplopia,  ptosis, ? 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  Get MRI, MRA , MRV of the head when stable. . Evaluate for aneurysm.  Get LP to evaluate for CNS lymphoma. Ordered placed, scheduled for tomorrow.  Plan for the MRI today.    Lower  extremity weakness, left foot drop :  ? Lymphoma involving the nerve roots, when stable repeat MRI lumbar spine with contrast if creatinine allows.    Sepsis from right sided pneumonia:  On broad spectrum IV antibiotics. Follow blood cultures.  Trend lactate, fluid resuscitation. Stress dose steroids added, Keep MAP >65.  Pt not requiring  oxygen.  Pt afebrile, but leukocytosis is worsening.  PCCM on board for hypotension, not responding to fluids and steroids.     Anemia and thrombocytopenia:  From chemotherapy.  Transfuse to keep hemoglobin greater than 7.  Platelets around 120's and stable.    Hypokalemia  Replaced. And repeat in am shows much improvement.  Magnesium level wnl.        DVT prophylaxis:heparin Code Status: full code.  Family Communication: family at bedside.  Disposition Plan: pending resolution of sepsis, atrial fib.     Consultants:   Neurology.   Cardiology.  PCCm for IV pressors.  Oncology.   Procedures: none.   Antimicrobials: vancomycin and cefepime for the pneumonia.    Subjective: More alert today, but appears exhausted.   Objective: Vitals:   04/22/2017 0745 04/13/2017 0800 04/07/2017 0900 04/26/2017 1000  BP:  94/72 120/77 119/68  Pulse:  (!) 145 (!) 138 (!) 138  Resp:  (!) 28 17 (!) 22  Temp: 98 F (36.7 C)     TempSrc: Oral     SpO2:  100% 97% 98%  Weight:      Height:  Intake/Output Summary (Last 24 hours) at 04/11/2017 1108 Last data filed at 04/10/2017 0934 Gross per 24 hour  Intake 6532.69 ml  Output 350 ml  Net 6182.69 ml   Filed Weights   03/16/2017 1855  Weight: 115.5 kg (254 lb 10.1 oz)    Examination:  General exam: alert today and comfortable, not in distress.  Respiratory system:  Diminished at bases, no wheezing or rhonchi.   Cardiovascular system: tachycardic, irregular, no murmers can be appreciated.   Gastrointestinal system: Abdomen is soft NT ND BS+ Central nervous system: Alert and  oriented.ptosis, lower extremity weakness.  Extremities: trace pedal edema.  Skin: No rashes, lesions or ulcers Psychiatry:  Mood & affect appropriate.     Data Reviewed: I have personally reviewed following labs and imaging studies  CBC: Recent Labs  Lab 03/12/2017 1038 03/17/2017 1309 03/31/17 0513 04/01/17 0430 04/30/2017 0500  WBC 20.2* 18.5* 18.9* 25.2* 33.0*  NEUTROABS 16.7* 14.0*  --   --   --   HGB 9.5* 9.9* 8.5* 8.1* 7.7*  HCT 28.9* 30.2* 25.5* 24.1* 23.1*  MCV 85.0 85.8 85.0 84.0 83.1  PLT 122* 102* 124* 123* 188*   Basic Metabolic Panel: Recent Labs  Lab 03/15/2017 1038 03/29/2017 1309 03/31/17 0513 03/31/17 1330 04/01/17 0430 04/04/2017 0500  NA 139 141 141  --  141 140  K 3.4* 3.6 3.1*  --  4.4 4.3  CL 100 103 107  --  110 111  CO2 24 21* 22  --  20* 18*  GLUCOSE 98 89 87  --  87 128*  BUN 38* 38* 30*  --  28* 27*  CREATININE 2.48* 2.39* 2.22*  --  2.20* 2.04*  CALCIUM 9.2 8.9 8.3*  --  8.6* 8.3*  MG  --   --   --  2.2 2.0  --    GFR: Estimated Creatinine Clearance: 32.1 mL/min (A) (by C-G formula based on SCr of 2.04 mg/dL (H)). Liver Function Tests: Recent Labs  Lab 03/15/2017 1038 03/09/2017 1309 03/31/17 0513  AST 45* 47* 51*  ALT 23 25 25   ALKPHOS 121 105 96  BILITOT 1.0 1.1 0.8  PROT 5.8* 5.5* 4.8*  ALBUMIN 2.9* 2.8* 2.4*   No results for input(s): LIPASE, AMYLASE in the last 168 hours. No results for input(s): AMMONIA in the last 168 hours. Coagulation Profile: No results for input(s): INR, PROTIME in the last 168 hours. Cardiac Enzymes: No results for input(s): CKTOTAL, CKMB, CKMBINDEX, TROPONINI in the last 168 hours. BNP (last 3 results) No results for input(s): PROBNP in the last 8760 hours. HbA1C: No results for input(s): HGBA1C in the last 72 hours. CBG: No results for input(s): GLUCAP in the last 168 hours. Lipid Profile: No results for input(s): CHOL, HDL, LDLCALC, TRIG, CHOLHDL, LDLDIRECT in the last 72 hours. Thyroid Function  Tests: No results for input(s): TSH, T4TOTAL, FREET4, T3FREE, THYROIDAB in the last 72 hours. Anemia Panel: No results for input(s): VITAMINB12, FOLATE, FERRITIN, TIBC, IRON, RETICCTPCT in the last 72 hours. Sepsis Labs: Recent Labs  Lab 03/29/2017 1523 03/31/17 1433 04/01/17 0850 04/01/17 1541  LATICACIDVEN 2.60* 3.1* 2.4* 1.9    Recent Results (from the past 240 hour(s))  Blood Culture (routine x 2)     Status: None (Preliminary result)   Collection Time: 03/24/2017  1:40 PM  Result Value Ref Range Status   Specimen Description   Final    BLOOD LEFT ANTECUBITAL Performed at [facility], [address] [place], [place]  [address]    Special Requests   Final    BOTTLES DRAWN AEROBIC AND ANAEROBIC Blood Culture results may not be optimal due to an inadequate volume of blood received in culture bottles Performed at [facility], [place] [address] [place], [place] [address]    Culture   Final    NO GROWTH 2 DAYS Performed at [facility] [address], [place], [place] [address]    Report Status PENDING  Incomplete  MRSA PCR Screening     Status: None   Collection Time: 03/14/2017  6:56 PM  Result Value Ref Range Status   MRSA by PCR NEGATIVE NEGATIVE Final    Comment:        The GeneXpert MRSA Assay (FDA approved for NASAL specimens only), is one component of a comprehensive MRSA colonization surveillance program. It is not intended to diagnose MRSA infection nor to guide or monitor treatment for MRSA infections. Performed at [facility], [place] [address] [place], [place] [address]   Blood Culture (routine x 2)     Status: None (Preliminary result)   Collection Time: 03/14/2017  7:14 PM  Result Value Ref Range Status   Specimen Description   Final    BLOOD RIGHT HAND Performed at [facility] [address] [place], [place] [address]    Special Requests   Final    AEB BCLV Performed at  [facility] [address], [place], [place] [address]    Culture   Final    NO GROWTH 2 DAYS Performed at [facility] [address], [place], [place] [address]    Report Status PENDING  Incomplete         Radiology Studies: US Renal  Result Date: 04/01/2017 CLINICAL DATA:  Acute renal failure. EXAM: RENAL / URINARY TRACT ULTRASOUND COMPLETE COMPARISON:  None. FINDINGS: Right Kidney: Length: 10.6 cm. Echogenicity within normal limits. No mass or hydronephrosis visualized. Left Kidney: Length: 11.3 cm. Echogenicity within normal limits. No mass or hydronephrosis visualized. Bladder: Appears normal for degree of bladder distention. IMPRESSION: Normal renal ultrasound. Electronically Signed   By: Fidela Salisbury M.D.   On: 04/01/2017 01:21        Scheduled Meds: . Chlorhexidine Gluconate Cloth  6 each Topical Daily  . hydrocortisone sod succinate (SOLU-CORTEF) inj  100 mg Intravenous Q8H  . sodium chloride flush  10-40 mL Intracatheter Q12H  . sodium chloride flush  3 mL Intravenous Q12H  . sodium chloride flush  3 mL Intravenous Q12H   Continuous Infusions: . sodium chloride 100 mL/hr at 04/06/2017 0827  . sodium chloride    . sodium chloride    . amiodarone 30 mg/hr (04/06/2017 0800)  . ceFEPime (MAXIPIME) IV Stopped (04/09/2017 1010)  . heparin 1,200 Units/hr (04/27/2017 0931)  . vancomycin Stopped (04/01/17 1652)     LOS: 3 days    Time spent: 35 minutes.     Hosie Poisson, MD [facility] Pager [phone]   If 7PM-7AM, please contact night-coverage [email or web address] Password [email or web address] 04/26/2017, 11:08 AM

## 2017-04-02 NOTE — Op Note (Signed)
Procedure: Electrical Cardioversion Indications:  Atrial Flutter  Procedure Details:  Consent: Risks of procedure as well as the alternatives and risks of each were explained to the (patient/caregiver).  Consent for procedure obtained.  Time Out: Verified patient identification, verified procedure, site/side was marked, verified correct patient position, special equipment/implants available, medications/allergies/relevent history reviewed, required imaging and test results available.  Performed  Patient placed on cardiac monitor, pulse oximetry, supplemental oxygen as necessary.  Sedation given: IV propofol, Dr. Kalman Shan Pacer pads placed anterior and posterior chest.  Cardioverted 1 time(s).  Cardioversion with synchronized biphasic 120J shock.  Evaluation: Findings: Post procedure EKG shows: NSR Complications: None Patient did tolerate procedure well.  Time Spent Directly with the Patient:  30 minutes   Jo Collins 04/03/2017, 12:30 PM

## 2017-04-02 NOTE — Progress Notes (Addendum)
ANTICOAGULATION CONSULT NOTE - Follow Up Consult  Pharmacy Consult for heparin Indication: atrial fibrillation  No Known Allergies  Patient Measurements: Height: 5\' 1"  (154.9 cm) Weight: 254 lb 10.1 oz (115.5 kg) IBW/kg (Calculated) : 47.8 Heparin Dosing Weight: 76 kg  Vital Signs: Temp: 98 F (36.7 C) (04/01 0745) Temp Source: Oral (04/01 0745) BP: 94/72 (04/01 0800) Pulse Rate: 145 (04/01 0800)  Labs: Recent Labs    03/31/17 0513  03/31/17 2200 04/01/17 0430 04/01/17 0825 04/14/2017 0500  HGB 8.5*  --   --  8.1*  --  7.7*  HCT 25.5*  --   --  24.1*  --  23.1*  PLT 124*  --   --  123*  --  124*  HEPARINUNFRC 0.26*   < > 0.40  --  0.51 0.61  CREATININE 2.22*  --   --  2.20*  --  2.04*   < > = values in this interval not displayed.    Estimated Creatinine Clearance: 32.1 mL/min (A) (by C-G formula based on SCr of 2.04 mg/dL (H)).   Assessment: Patient is a 67 y.o. F with hx lymphoma currently undergoing chemotherapy treatment presented to the ED on 03/05/2017 with afib with RVR. She's currently on heparin drip for afib.  Today, 04/29/2017: - heparin level remains therapeutic at 0.61 on heparin 1200 units/hr - Hgb trending down, plts low but stable - no bleeding documented - Per notes plan for cardioversion today (bedside) as well as MRI and LP to r/o CNS lymphoma.   - per IR, plan to perform LP at ~1PM.  D/w TRH, OK to stop heparin gtt now (RN stopped at [phone])  Goal of Therapy:  Heparin level 0.3-0.7 units/ml Monitor platelets by anticoagulation protocol: Yes   Plan:  - Heparin level therapeutic this am on heparin 1200 units/hr but heparin now off for LP this afternoon.   - f/u plan for when appropriate to resume heparin gtt.  Typically held x 6h after procedure - daily heparin level and CBC - monitor for s/s bleeding  Doreene Eland, PharmD, BCPS.   Pager: [phone] 04/21/2017 9:05 AM  ADDENDUM: Cardiology does NOT want heparin stopped for DCCV.  Heparin  resumed at being off x 5min.  Per neurology note, LP needs to be done in the morning so tests can be run. IR states LP can be done 4/2 at 11am and prefers heparin off x 4h - heparin resumed at 1200 units/hr and stop at 0700 4/2  Doreene Eland, PharmD, BCPS.   Pager: [phone] 04/10/2017 10:25 AM

## 2017-04-02 NOTE — Op Note (Signed)
INDICATIONS: atrial flutter  PROCEDURE:   Informed consent was obtained prior to the procedure. The risks, benefits and alternatives for the procedure were discussed and the patient comprehended these risks.  Risks include, but are not limited to, cough, sore throat, vomiting, nausea, somnolence, esophageal and stomach trauma or perforation, bleeding, low blood pressure, aspiration, pneumonia, infection, trauma to the teeth and death.    After a procedural time-out, the oropharynx was anesthetized with 20% benzocaine spray.   During this procedure the patient was administered IV propofol by Anesthesiology, Dr. Kalman Shan.  The transesophageal probe was inserted in the esophagus and stomach without difficulty and multiple views were obtained.  The patient was kept under observation until the patient left the procedure room.  The patient left the procedure room in stable condition.   Agitated microbubble saline contrast was not administered.  COMPLICATIONS:    There were no immediate complications.  FINDINGS:  Mild biatrial dilation. Excellent left atrial appendage emptying velocities. No thrombus. LVEF>65%. Mild MR, mild TR. Normal aorta. No pericardial effusion.  RECOMMENDATIONS:     Proceed with DCCV  Time Spent Directly with the Patient:  30 minutes   Jo Collins 04/03/2017, 12:28 PM

## 2017-04-02 NOTE — Interval H&P Note (Signed)
History and Physical Interval Note:  04/07/2017 11:51 AM  Jo Collins  has presented today for surgery, with the diagnosis of afib  The various methods of treatment have been discussed with the patient and family. After consideration of risks, benefits and other options for treatment, the patient has consented to  Procedure(s): CARDIOVERSION (N/A) as a surgical intervention .  The patient's history has been reviewed, patient examined, no change in status, stable for surgery.  I have reviewed the patient's chart and labs.  Questions were answered to the patient's satisfaction.     Jeanny Rymer

## 2017-04-02 NOTE — Transfer of Care (Signed)
Immediate Anesthesia Transfer of Care Note  Patient: Jo Collins  Procedure(s) Performed: CARDIOVERSION (N/A )  Patient Location: PACU  Anesthesia Type:MAC  Level of Consciousness: sedated and drowsy  Airway & Oxygen Therapy: Patient Spontanous Breathing and Patient connected to face mask oxygen  Post-op Assessment: Report given to RN and Post -op Vital signs reviewed and stable  Post vital signs: Reviewed and stable  Last Vitals:  Vitals Value Taken Time  BP 100/79 04/21/2017 12:30 PM  Temp    Pulse 72 05/01/2017 12:31 PM  Resp 21 04/17/2017 12:31 PM  SpO2 100 % 04/03/2017 12:31 PM  Vitals shown include unvalidated device data.  Last Pain:  Vitals:   04/22/2017 1204  TempSrc: Oral  PainSc:       Patients Stated Pain Goal: 3 (18/36/72 5500)  Complications: No apparent anesthesia complications

## 2017-04-02 NOTE — Progress Notes (Signed)
Per neurology note patient will need "LP will need to be scheduled in the AM on a weekday to allow enough time for sample to be run during normal pathology shift lab hours." IR called and informed and they agreed the best time for the LP would be 04/03/17 at 1100 and the Heparin gtt needs to be off at 0700 on 04/03/17. Neurology PA paged to notify of date change.

## 2017-04-02 NOTE — Progress Notes (Addendum)
PULMONARY / CRITICAL CARE MEDICINE   Name: Jo Collins MRN: [id number] DOB: 05-03-1950    ADMISSION DATE:  03/28/2017 CONSULTATION DATE:  3/30  CHIEF COMPLAINT: Consult for hypotension  HISTORY OF PRESENT ILLNESS:    67 year old undergoing relaxed large B cell lymphoma who presented to the department of emergency medicine with diplopia.  She has a history of atrial fibrillation intermittently and was found to be in atrial fibrillation relatively hypotensive.  She has been volume loaded and started on amiodarone but remains persistently hypotensive.  She denies fever cough or chills.  She denies abdominal pain or dysuria.  She has a Port-A-Cath in place.  She has been treated with vancomycin and cefepime and placed on stress dose steroids today without improvement.  At least part of her chemotherapy regimen has included Adriamycin.  I was present for an echo today which shows a vigorously contracting left ventricle, and a collapsing vena cava suggestive of volume depletion.  There is no pericardial effusion.    SUBJECTIVE:  No distress  VITAL SIGNS: Blood Pressure 94/72 (BP Location: Right Wrist)   Pulse (Abnormal) 145   Temperature 98 F (36.7 C) (Oral)   Respiration (Abnormal) 28   Height 5\' 1"  (1.549 m)   Weight 254 lb 10.1 oz (115.5 kg)   Oxygen Saturation 100%   Body Mass Index 48.11 kg/m  . sodium chloride 100 mL/hr at 04/08/2017 0827  . sodium chloride    . amiodarone 30 mg/hr (04/30/2017 0800)  . ceFEPime (MAXIPIME) IV Stopped (04/01/17 2302)  . heparin Stopped (04/15/2017 0843)  . vancomycin Stopped (04/01/17 1652)    HEMODYNAMICS:    VENTILATOR SETTINGS:    INTAKE / OUTPUT:  Intake/Output Summary (Last 24 hours) at 04/15/2017 0903 Last data filed at 04/26/2017 0800 Gross per 24 hour  Intake 7166.19 ml  Output 350 ml  Net 6816.19 ml     PHYSICAL EXAMINATION: General: 67 year old aaf. Resting comfortably in bed HENT: right sided ptosis. MMM.  Pulm: slightly  decreased in bases. No accessory use.  Card: RRR no MRG Ext: no edema brisk CR. Warm strong pulses Abd: soft not tender  Neuro: awake alert no focal def. Right sided ptosis as above   LABS:  BMET Recent Labs  Lab 03/31/17 0513 04/01/17 0430 04/21/2017 0500  NA 141 141 140  K 3.1* 4.4 4.3  CL 107 110 111  CO2 22 20* 18*  BUN 30* 28* 27*  CREATININE 2.22* 2.20* 2.04*  GLUCOSE 87 87 128*    Electrolytes Recent Labs  Lab 03/31/17 0513 03/31/17 1330 04/01/17 0430 04/21/2017 0500  CALCIUM 8.3*  --  8.6* 8.3*  MG  --  2.2 2.0  --     CBC Recent Labs  Lab 03/31/17 0513 04/01/17 0430 04/30/2017 0500  WBC 18.9* 25.2* 33.0*  HGB 8.5* 8.1* 7.7*  HCT 25.5* 24.1* 23.1*  PLT 124* 123* 124*    Coag's No results for input(s): APTT, INR in the last 168 hours.  Sepsis Markers Recent Labs  Lab 03/31/17 1433 04/01/17 0850 04/01/17 1541  LATICACIDVEN 3.1* 2.4* 1.9    ABG No results for input(s): PHART, PCO2ART, PO2ART in the last 168 hours.  Liver Enzymes Recent Labs  Lab 03/27/2017 1038 03/26/2017 1309 03/31/17 0513  AST 45* 47* 51*  ALT 23 25 25   ALKPHOS 121 105 96  BILITOT 1.0 1.1 0.8  ALBUMIN 2.9* 2.8* 2.4*    Cardiac Enzymes No results for input(s): TROPONINI, PROBNP in the last 168 hours.  Glucose No results for input(s): GLUCAP in the last 168 hours.  Imaging No results found.   STUDIES:  Admission chest x-ray showed likely right sided effusion and probably a right lower lobe infiltrate.  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  She is in atrial fibrillation and hypotensive.  ASSESSMENT / PLAN:  Relapse of diffuse B cell Lymphoma -->bone marrow showing extensive  involvement. New chest wall mass and increasing lymphadenopathy. She is s/p 2 cycles og GCD Plan F/b [place]; holding chemo currently   New right eye diplopia/ptosis.  -seen by neurology. Felt likely to be CNS involvement of her lymphoma but also consider acute neurologic event  Plan For LP to r/o CNS lymphoma For MRI/MRA/MRV  Hypotension w/ new onset a-flutter -->BP stable  Plan Heparin gtt and amiodarone For planned cardioversion w/ cards Cont tele  F/u repeat echo  Recurrent right effusion +/-Pneumonia (NOS)  She has been seen by ramaswamy in our office. Effusion has been lymphocyte predominant; last thora oct 2018 pcxr personally evaluated showed Right sided consolidative air space disease from 3/30.  tmax improved But WBC rising Plan Day # 4 vanc and cefepime PCT algo F/u cxr Consider [place] right chest r/o effusion   Recent Tumor lysis syndrome and acute kidney injury-->Cr improved Plan Cont IV hydration  Renal dose meds Strict I&O Am chemistry   NAG metabolic acidosis  Plan Cont IV hydration  Anemia of chronic illness Plan Trend cbc Cont heparin  thrombocytopenia 2/2 lymphoma and chemo->improved. Had been as low as 25 Plan  Monitor    -> hypotension resolved. No critical care issues. We will s/o.   DVT prophylaxis: IV heparin  SUP: na  Diet: NPO Activity: BR Disposition : ICU  Erick Colace ACNP-BC [place] Pager # [phone] OR # [phone] if no answer     04/15/2017, 8:55 AM

## 2017-04-02 NOTE — Progress Notes (Signed)
PCCM Progress Note   Subjective: Short of breath.  Denies chest pain.  Vital signs: BP 100/79 (BP Location: Right Arm)   Pulse 71   Temp 98.5 F (36.9 C) (Oral)   Resp 19   Ht 5\' 1"  (1.549 m)   Wt 254 lb 10.1 oz (115.5 kg)   SpO2 100%   BMI 48.11 kg/m   Intake/Output: I/O last 3 completed shifts: In: 8553.2 [P.O.:120; I.V.:4533.2; IV Piggyback:3900] Out: 900 [Urine:900]  Physical Exam:  General - alert Eyes - pupils reactive ENT - no sinus tenderness, no oral exudate, no LAN Cardiac - irregular, tachycardic no murmur Chest - no wheeze, rales Abd - soft, non tender Ext - no edema Skin - no rashes Neuro - normal strength Psych - normal mood  CMP Latest Ref Rng & Units 04/25/2017 04/01/2017 03/31/2017  Glucose 65 - 99 mg/dL 128(H) 87 87  BUN 6 - 20 mg/dL 27(H) 28(H) 30(H)  Creatinine 0.44 - 1.00 mg/dL 2.04(H) 2.20(H) 2.22(H)  Sodium 135 - 145 mmol/L 140 141 141  Potassium 3.5 - 5.1 mmol/L 4.3 4.4 3.1(L)  Chloride 101 - 111 mmol/L 111 110 107  CO2 22 - 32 mmol/L 18(L) 20(L) 22  Calcium 8.9 - 10.3 mg/dL 8.3(L) 8.6(L) 8.3(L)  Total Protein 6.5 - 8.1 g/dL - - 4.8(L)  Total Bilirubin 0.3 - 1.2 mg/dL - - 0.8  Alkaline Phos 38 - 126 U/L - - 96  AST 15 - 41 U/L - - 51(H)  ALT 14 - 54 U/L - - 25   CBC Latest Ref Rng & Units 04/13/2017 04/01/2017 03/31/2017  WBC 4.0 - 10.5 K/uL 33.0(H) 25.2(H) 18.9(H)  Hemoglobin 12.0 - 15.0 g/dL 7.7(L) 8.1(L) 8.5(L)  Hematocrit 36.0 - 46.0 % 23.1(L) 24.1(L) 25.5(L)  Platelets 150 - 400 K/uL 124(L) 123(L) 124(L)    US Renal  Result Date: 04/01/2017 CLINICAL DATA:  Acute renal failure. EXAM: RENAL / URINARY TRACT ULTRASOUND COMPLETE COMPARISON:  None. FINDINGS: Right Kidney: Length: 10.6 cm. Echogenicity within normal limits. No mass or hydronephrosis visualized. Left Kidney: Length: 11.3 cm. Echogenicity within normal limits. No mass or hydronephrosis visualized. Bladder: Appears normal for degree of bladder distention. IMPRESSION: Normal renal  ultrasound. Electronically Signed   By: Fidela Salisbury M.D.   On: 04/01/2017 01:21    Assessment/plan:  Hypotension in setting of A fib. - resolved - for cardioversion and TEE  Diplopia. - IR to do LP when off anticoagulation  Chesley Mires, MD [place] 04/13/2017, 12:53 PM Pager:  [phone] After 3pm call: [phone]

## 2017-04-02 NOTE — Progress Notes (Addendum)
Progress Note  Patient Name: Jo Collins Date of Encounter: 04/18/2017  Primary Cardiologist: Dr. Irish Lack  Subjective   She is schedule to lumbar puncture later today and needed to hold heparin 2 hours prior to LP. Cardioversion at noon.  No palpitation, CP or SOB.   Inpatient Medications    Scheduled Meds: . Chlorhexidine Gluconate Cloth  6 each Topical Daily  . hydrocortisone sod succinate (SOLU-CORTEF) inj  100 mg Intravenous Q8H  . sodium chloride flush  10-40 mL Intracatheter Q12H  . sodium chloride flush  3 mL Intravenous Q12H   Continuous Infusions: . sodium chloride 100 mL/hr at 04/26/2017 0827  . sodium chloride    . amiodarone 30 mg/hr (04/08/2017 0800)  . ceFEPime (MAXIPIME) IV 2 g (04/29/2017 0933)  . heparin 1,200 Units/hr (04/25/2017 0931)  . vancomycin Stopped (04/01/17 1652)   PRN Meds: acetaminophen, levalbuterol, morphine injection, ondansetron (ZOFRAN) IV, sodium chloride flush, sodium chloride flush, traZODone   Vital Signs    Vitals:   04/27/2017 0353 04/11/2017 0745 04/26/2017 0800 04/30/2017 0900  BP:   94/72 120/77  Pulse:   (!) 145 (!) 138  Resp:   (!) 28 17  Temp: 97.6 F (36.4 C) 98 F (36.7 C)    TempSrc: Axillary Oral    SpO2:   100% 97%  Weight:      Height:        Intake/Output Summary (Last 24 hours) at 04/04/2017 0942 Last data filed at 04/11/2017 0934 Gross per 24 hour  Intake 7176.19 ml  Output 350 ml  Net 6826.19 ml   Filed Weights   03/28/2017 1855  Weight: 254 lb 10.1 oz (115.5 kg)    Telemetry    Atrial flutter at 130s - Personally Reviewed  ECG    N/A  Physical Exam   GEN: No acute distress.   Neck: No JVD Cardiac: regular tachycardic , no murmurs, rubs, or gallops.  Respiratory: diminished breath sound with diffuse wheezing.  GI: Soft, nontender, non-distended  MS: No edema; No deformity. Neuro:  Nonfocal  Psych: Normal affect   Labs    Chemistry Recent Labs  Lab 03/13/2017 1038  03/15/2017 1309 03/31/17 0513  04/01/17 0430 04/21/2017 0500  NA 139  --  141 141 141 140  K 3.4*  --  3.6 3.1* 4.4 4.3  CL 100  --  103 107 110 111  CO2 24  --  21* 22 20* 18*  GLUCOSE 98  --  89 87 87 128*  BUN 38*  --  38* 30* 28* 27*  CREATININE 2.48*   < > 2.39* 2.22* 2.20* 2.04*  CALCIUM 9.2  --  8.9 8.3* 8.6* 8.3*  PROT 5.8*  --  5.5* 4.8*  --   --   ALBUMIN 2.9*  --  2.8* 2.4*  --   --   AST 45*  --  47* 51*  --   --   ALT 23  --  25 25  --   --   ALKPHOS 121  --  105 96  --   --   BILITOT 1.0  --  1.1 0.8  --   --   GFRNONAA 19*   < > 20* 22* 22* 24*  GFRAA 22*   < > 23* 25* 26* 28*  ANIONGAP 15*  --  17* 12 11 11    < > = values in this interval not displayed.     Hematology Recent Labs  Lab 03/31/17 [phone] 04/01/17 0430  04/06/2017 0500  WBC 18.9* 25.2* 33.0*  RBC 3.00* 2.87* 2.78*  HGB 8.5* 8.1* 7.7*  HCT 25.5* 24.1* 23.1*  MCV 85.0 84.0 83.1  MCH 28.3 28.2 27.7  MCHC 33.3 33.6 33.3  RDW 17.9* 18.7* 19.6*  PLT 124* 123* 124*    Radiology    US Renal  Result Date: 04/01/2017 CLINICAL DATA:  Acute renal failure. EXAM: RENAL / URINARY TRACT ULTRASOUND COMPLETE COMPARISON:  None. FINDINGS: Right Kidney: Length: 10.6 cm. Echogenicity within normal limits. No mass or hydronephrosis visualized. Left Kidney: Length: 11.3 cm. Echogenicity within normal limits. No mass or hydronephrosis visualized. Bladder: Appears normal for degree of bladder distention. IMPRESSION: Normal renal ultrasound. Electronically Signed   By: Fidela Salisbury M.D.   On: 04/01/2017 01:21    Cardiac Studies   Echo 04/01/17 Study Conclusions  - HPI and indications: Atrial flutter 427.32. - Procedure narrative: Transthoracic echocardiography. Image   quality was poor. The study was technically difficult, as a   result of body habitus. - Left ventricle: The cavity size was normal. Wall thickness was   increased in a pattern of moderate LVH. Systolic function was   vigorous. The estimated ejection fraction was 75%. The  study is   not technically sufficient to allow evaluation of LV diastolic   function. - Mitral valve: Calcified annulus. Mildly thickened leaflets . At   least mild stenosis. There was trivial regurgitation. Valve area   by continuity equation (using LVOT flow): 1.68 cm^2. - Left atrium: Moderately dilated. - Atrial septum: There was increased thickness of the septum,   consistent with lipomatous hypertrophy. - Tricuspid valve: There was mild regurgitation. - Pulmonary arteries: PA peak pressure: 32 mm Hg (S). - Inferior vena cava: The vessel was normal in size. The   respirophasic diameter changes were in the normal range (>= 50%),   consistent with normal central venous pressure.  Impressions:  - Technically difficult study. LVEF 75%, rapid atrial flutter is   noted, moderate LVH, normal wall motion, MAC with at least mild   stenosis and trivial MR, moderae LAE, mild TR, RVSP 32 mmHg,   normal IVC, no pericardial effusion.  Patient Profile     67 year old female wit history of diffuse large B-cell lymphoma, sciatica, GERD, morbid obesity who presented with acute renal failure, pancytopenia and concern for sepsis. We are asked to see her for the development of atrial fibrillation with rapid ventricular response.  Assessment & Plan    1. Atrial flutter - Rate elevated despite on IV amiodarone. Low blood pressure limiting addition of another agent. She is anticoagulated with IV heparin since onset this admission. Prior hx of PAF  But not on anticoagulation due to chemo. CHADSVASC score of 2. Plan for bedside cardioversion today at noon. Discussed risk and benefits and she is agreed to proceed.   2. 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 Anemia - Hgb of 7.7 today this is down from 9.9 on 03/17/2017. Per primary team.   For questions or updates, please contact Edie Please consult [email or web address] for contact info under Cardiology/STEMI.      Signed, Leanor Kail, PA  04/15/2017, 9:42 AM    I have seen and examined the patient along with Leanor Kail, PA .  I have reviewed the chart, notes and new data.  I agree with PA's note.  Key new complaints: she is tachypneic, but reports her dyspnea as mild Key examination changes: rapid regular rhythm, clear lungs; morbidly  obese Key new findings / data: Hgb 7.7, creat 2.04  PLAN: She is at high risk for further hemodynamic deterioration with persistent atrial flutter with rates in 140s. She has had recurrent atrial tachyarrhythmia over the last few months, not anticoagulated until this admission. Plan TEE before cardioversion, with sedation provided by Anesthesiology. This procedure has been fully reviewed with the patient and her husband and written informed consent has been obtained.   Sanda Klein, MD, [place] [phone] 04/03/2017, 11:52 AM

## 2017-04-02 DEATH — deceased

## 2017-04-03 ENCOUNTER — Inpatient Hospital Stay (HOSPITAL_COMMUNITY): Payer: Medicare Other

## 2017-04-03 ENCOUNTER — Encounter (HOSPITAL_COMMUNITY): Payer: Self-pay | Admitting: Cardiovascular Disease

## 2017-04-03 DIAGNOSIS — I484 Atypical atrial flutter: Secondary | ICD-10-CM

## 2017-04-03 LAB — CSF CELL COUNT WITH DIFFERENTIAL
RBC Count, CSF: 63 /mm3 — ABNORMAL HIGH
Tube #: 4
WBC CSF: 2 /mm3 (ref 0–5)

## 2017-04-03 LAB — CBC
HEMATOCRIT: 23.1 % — AB (ref 36.0–46.0)
HEMOGLOBIN: 7.8 g/dL — AB (ref 12.0–15.0)
MCH: 27.4 pg (ref 26.0–34.0)
MCHC: 33.8 g/dL (ref 30.0–36.0)
MCV: 81.1 fL (ref 78.0–100.0)
Platelets: 121 10*3/uL — ABNORMAL LOW (ref 150–400)
RBC: 2.85 MIL/uL — ABNORMAL LOW (ref 3.87–5.11)
RDW: 20.1 % — AB (ref 11.5–15.5)
WBC: 42.3 10*3/uL — AB (ref 4.0–10.5)

## 2017-04-03 LAB — PROTEIN AND GLUCOSE, CSF
GLUCOSE CSF: 77 mg/dL — AB (ref 40–70)
Total  Protein, CSF: 254 mg/dL — ABNORMAL HIGH (ref 15–45)

## 2017-04-03 LAB — PATHOLOGIST SMEAR REVIEW

## 2017-04-03 LAB — CREATININE, SERUM
CREATININE: 2.27 mg/dL — AB (ref 0.44–1.00)
GFR calc Af Amer: 25 mL/min — ABNORMAL LOW (ref >60)
GFR calc non Af Amer: 21 mL/min — ABNORMAL LOW (ref >60)

## 2017-04-03 LAB — HEPARIN LEVEL (UNFRACTIONATED): Heparin Unfractionated: 0.53 IU/mL (ref 0.30–0.70)

## 2017-04-03 MED ORDER — METOPROLOL TARTRATE 5 MG/5ML IV SOLN
5.0000 mg | Freq: Once | INTRAVENOUS | Status: AC
  Administered 2017-04-03: 5 mg via INTRAVENOUS
  Filled 2017-04-03: qty 5

## 2017-04-03 MED ORDER — HEPARIN (PORCINE) IN NACL 100-0.45 UNIT/ML-% IJ SOLN
800.0000 [IU]/h | INTRAMUSCULAR | Status: DC
  Administered 2017-04-04 – 2017-04-07 (×2): 1300 [IU]/h via INTRAVENOUS
  Administered 2017-04-07: 1000 [IU]/h via INTRAVENOUS
  Administered 2017-04-09 – 2017-04-10 (×2): 900 [IU]/h via INTRAVENOUS
  Filled 2017-04-03 (×8): qty 250

## 2017-04-03 MED ORDER — METOPROLOL TARTRATE 25 MG PO TABS
25.0000 mg | ORAL_TABLET | Freq: Three times a day (TID) | ORAL | Status: DC
  Administered 2017-04-03 – 2017-04-05 (×4): 25 mg via ORAL
  Filled 2017-04-03 (×4): qty 1

## 2017-04-03 MED ORDER — LIDOCAINE HCL 1 % IJ SOLN
INTRAMUSCULAR | Status: AC
  Filled 2017-04-03: qty 20

## 2017-04-03 MED ORDER — DILTIAZEM HCL-DEXTROSE 100-5 MG/100ML-% IV SOLN (PREMIX)
10.0000 mg/h | INTRAVENOUS | Status: DC
Start: 2017-04-03 — End: 2017-04-10
  Administered 2017-04-03 (×2): 15 mg/h via INTRAVENOUS
  Administered 2017-04-03: 5 mg/h via INTRAVENOUS
  Administered 2017-04-04 – 2017-04-10 (×11): 10 mg/h via INTRAVENOUS
  Filled 2017-04-03 (×20): qty 100

## 2017-04-03 MED ORDER — MORPHINE SULFATE (PF) 4 MG/ML IV SOLN
1.0000 mg | INTRAVENOUS | Status: DC | PRN
  Administered 2017-04-03 (×2): 2 mg via INTRAVENOUS
  Filled 2017-04-03 (×2): qty 1

## 2017-04-03 MED ORDER — SODIUM CHLORIDE 0.9 % IV BOLUS
500.0000 mL | Freq: Once | INTRAVENOUS | Status: AC
  Administered 2017-04-03: 500 mL via INTRAVENOUS

## 2017-04-03 MED ORDER — SODIUM CHLORIDE 0.9 % IV SOLN
2.0000 g | INTRAVENOUS | Status: DC
  Administered 2017-04-03: 2 g via INTRAVENOUS
  Filled 2017-04-03 (×2): qty 2

## 2017-04-03 MED ORDER — ALLOPURINOL 100 MG PO TABS
100.0000 mg | ORAL_TABLET | Freq: Two times a day (BID) | ORAL | Status: DC
  Administered 2017-04-04 – 2017-04-11 (×15): 100 mg via ORAL
  Filled 2017-04-03 (×15): qty 1

## 2017-04-03 MED ORDER — LORAZEPAM 2 MG/ML IJ SOLN
1.0000 mg | Freq: Once | INTRAMUSCULAR | Status: DC
  Filled 2017-04-03: qty 1

## 2017-04-03 NOTE — Progress Notes (Signed)
Initial Nutrition Assessment  DOCUMENTATION CODES:   Morbid obesity  INTERVENTION:  - Continue Ensure Enlive BID, each supplement provides 350 kcal and 20 grams of protein - Continue to encourage PO intakes. - RD will continue to monitor for nutrition-related needs. - Will attempt NFPE at follow-up.    NUTRITION DIAGNOSIS:   Increased nutrient needs related to catabolic illness, cancer and cancer related treatments, acute illness as evidenced by estimated needs.  GOAL:   Patient will meet greater than or equal to 90% of their needs  MONITOR:   PO intake, Supplement acceptance, Weight trends, Labs  REASON FOR ASSESSMENT:   Consult Assessment of nutrition requirement/status  ASSESSMENT:   67 y.o. female with multiple medical problems including  paroxysmal atrial fibrillation not on anticoagulation, large B cell lymphoma, ongoing chemotherapy with recent tumor lysis syndrome resulting in acute renal failure, anemia and hospitalization, prior biopsy of the right iliac crest in January and ongoing issues with low back pain with radiation down to the left leg with weakness and numbness who was seen last week in the emergency department for complaints of leg pain, diplopia. She was admitted for afib with RVR and possible sepsis from pneumonia.   No intakes documented since admission. Pt was sleeping soundly at the time of RD visit. Her sister was at bedside and provided all information. She reports that pt ordered breakfast and was feeling hungry, but then was told that she was unable to eat d/t planned MRI. She states that pt usually has a good appetite and eats well but that appetite has been very poor with pt only eating a few spoonfuls per meal since Friday (3/29). Pt has not mentioned any abdominal pain or nausea since admission or in the time PTA. She has been experiencing taste alteration (bland taste, lack of taste) since starting chemo, but PTA this did not affect her intakes as  much as it has since admission. Family requested that pt not be aroused to perform NFPE at this time.   Pt was seen by another RD on 03/19/17 and note from that date reviewed. At that time, pt reported eating well despite taste changes and that she enjoyed fruit and tart foods as she experienced increased taste perception with those items. She was consuming ~75% of meals at that time and was interested in a protein supplement so Premier Protein was ordered BID.  Ensure Enlive was ordered BID yesterday. Per chart review, pt lost 21 lbs (8.3% body weight) from 3/8-3/29 and now +42 lbs from 3/29-4/2. Will continue to monitor weight trends closely.   Neuro is following and plan for several tests today: MRI/MRA/MRV/LP. She is s/p cardioversion yesterday.   Medications reviewed. Labs reviewed; BUN: 27 mg/dL, creatinine: 2.27 mg/dL, Ca: 8.3 mg/dL, uric acid: 7.9 mg/dL, GFR: 25 mL/min.  IVF: NS @ 100 mL/hr.   NUTRITION - FOCUSED PHYSICAL EXAM:  Unable to complete at this time; will attempt at follow-up.  Diet Order:  Diet Heart Room service appropriate? Yes; Fluid consistency: Thin  EDUCATION NEEDS:   No education needs have been identified at this time  Skin:  Skin Assessment: Skin Integrity Issues: Skin Integrity Issues:: Other (Comment) Other: lower back wound from biopsy  Last BM:  PTA/unknown  Height:   Ht Readings from Last 1 Encounters:  03/21/2017 5\' 1"  (1.549 m)    Weight:   Wt Readings from Last 1 Encounters:  04/03/17 275 lb 2.2 oz (124.8 kg)    Ideal Body Weight:  47.72 kg  BMI:  Body mass index is 51.99 kg/m.  Estimated Nutritional Needs:   Kcal:  2250-2495 (18-20 kcalkg)  Protein:  100-125 grams (0.8-1 grams/kg)  Fluid:  >/= 2 L/day      Jarome Matin, MS, RD, LDN, [facility] Inpatient Clinical Dietitian Pager # [phone] After hours/weekend pager # [phone]

## 2017-04-03 NOTE — Progress Notes (Signed)
Pharmacy Antibiotic Note  Jo Collins is a 67 y.o. female admitted on 03/15/2017 with HCAP, sepsis, cellulitis and an abscess over her gluteal cleft concerning for a pilonidal abscess s/p bedside I&D in ED 3/29.  Pharmacy has been consulted for vancomycin and cefepime dosing.  Today, 04/03/2017: Day #4 antibiotics -  wbc elevated - ? Cause - no fevers - scr trended up overnight - all cultures have been negative thus far  Plan: - continue vancomycin dose to 1000 mg IV q48h  - Expect this will likely be last dose following d/w hospitalist - change cefepime to 2gm IV q24h - f/u renal function ______________________________  Height: 5\' 1"  (154.9 cm) Weight: 275 lb 2.2 oz (124.8 kg) IBW/kg (Calculated) : 47.8  Temp (24hrs), Avg:98 F (36.7 C), Min:97.2 F (36.2 C), Max:98.5 F (36.9 C)  Recent Labs  Lab 03/28/2017 1309 03/29/2017 1335 03/17/2017 1523 03/31/17 0513 03/31/17 1433 04/01/17 0430 04/01/17 0850 04/01/17 1541 04/24/2017 0500 04/03/17 0500  WBC 18.5*  --   --  18.9*  --  25.2*  --   --  33.0* 42.3*  CREATININE 2.39*  --   --  2.22*  --  2.20*  --   --  2.04* 2.27*  LATICACIDVEN  --  3.00* 2.60*  --  3.1*  --  2.4* 1.9  --   --     Estimated Creatinine Clearance: 30.2 mL/min (A) (by C-G formula based on SCr of 2.27 mg/dL (H)).    No Known Allergies  Antimicrobials this admission: vanc 3/29>> Cefepime 3/29>>  Dose adjustments this admission: --  Microbiology results: 3/29 at 1340 BCx1:  NGTD 3/29 at 1914 bcx x1: NGTD 3/29 MRSA pcr: neg  Thank you for allowing pharmacy to be a part of this patient's care.  Doreene Eland, PharmD, BCPS.   Pager: [phone] 04/03/2017 8:26 AM

## 2017-04-03 NOTE — Progress Notes (Signed)
Pt. back in Afib rhythm,. HR in th 140's.IV Cardizem restarted .

## 2017-04-03 NOTE — Procedures (Signed)
Procedure: LP w fluoro guidance at L2-3. Specimen: CSF, to lab Bleeding: minimal. Complications: None immediate. Patient   -Condition: Stable.  -Disposition:  Return to inpatient floor.  full Radiology Report to follow under IMAGING

## 2017-04-03 NOTE — Progress Notes (Signed)
PROGRESS NOTE    Jo Collins  OAC:[id number] DOB: 1950-02-08 DOA: 03/04/2017 PCP: Deland Pretty, MD   Brief Narrative:  Jo Collins is a 67 y.o. female with multiple medical problems including paroxysmal atrial fibrillation not on anticoagulation, large B cell lymphoma, ongoing chemotherapy with recent tumor lysis syndrome resulting in acute renal failure, anemia and hospitalization, prior biopsy of the right iliac crest in January and ongoing issues with low back pain with radiation down to the left leg with weakness and numbness who was seen last week in the emergency department for complaints of leg pain, diplopia. She was admitted for afib with RVR and possible sepsis from pneumonia.     Assessment & Plan:   Active Problems:   NHL (non-Hodgkin's lymphoma) (HCC)   Morbid obesity with BMI of 50.0-59.9, adult (HCC)   Diffuse large B cell lymphoma (HCC)   Recurrent right pleural effusion   Atrial flutter (HCC)   Sepsis (HCC)   Leukocytosis   Acute on chronic renal failure (HCC)   HCAP (healthcare-associated pneumonia)   3rd nerve palsy, complete, right   Relapsed diffuse B cell Lymphoma:  Further management as per Dr Irene Limbo.    Atrial flutter  with RVR:  Did not have a good response to cardizem, then she was started on IV amiodarone with minimal response. Cardiology was on board and she underwent cardioversion on 4/1, and was in sinus rhythm till the evening,  but unfortunately she went back in to A fib, cardizem was restarted,. Cardiology recommended loading her with IV amio before doing another cardioversion. Would continue with  IV heparin for anti coagulation.   appreciate cardiology recommendations.     ARF: ? Prerenal vs tumour lysis syndrome.  Baseline creatinine less than 1.  Fluid resuscitation and repeat renal parameters in am does not show much improvement.  FeNa is 0.2.  UA is negative for infection and US renal is negative for hydronephrosis.  Creatinine     Right eye swelling, diplopia,  ptosis, ? 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  Get MRI, MRA , MRV of the head , unfortunately she  Couldn't fit in to the MRI at [facility], transfer the patient to [facility] and will probably stay there for neurology work up.    Lower extremity weakness, left foot drop :  ? Lymphoma involving the nerve roots, when stable repeat MRI lumbar spine with contrast if creatinine allows at a later time.    Sepsis from right sided pneumonia:  On broad spectrum IV antibiotics. Follow blood cultures.  Trend lactate, fluid resuscitation. Stress dose steroids added, her bp is stable and  Keep MAP >65.  Pt not requiring [place] oxygen.  Pt afebrile, but leukocytosis is worsening, not sure if its from steroids vs infection vs lymphoma. Differential shows neutrophils.  PCCM was consulted when she was hypotensive and not responding to steroids.     Anemia and thrombocytopenia:  From chemotherapy? Transfuse to keep hemoglobin greater than 7.  Platelets around 120's and stable.    Hypokalemia  Replaced. And repeat in am shows much improvement.  Magnesium level wnl.        DVT prophylaxis:heparin Code Status: full code.  Family Communication: family at bedside.  Disposition Plan: pending resolution of sepsis, atrial fib.     Consultants:   Neurology.   Cardiology.  PCCm for IV pressors.  Oncology.   Procedures: none.   Antimicrobials: vancomycin and cefepime for the pneumonia since admission till 4/2,  vancomycin discontinued on 4/2 and only on cefepime.     Subjective: No new complaints. She reports she feels tired.  No nausea or vomiting. No abdominal pain.   Objective: Vitals:   04/03/17 0900 04/03/17 1110 04/03/17 1140 04/03/17 1200  BP: 126/85 111/73  106/70  Pulse: 132 30    Resp: 21 13  14   Temp:   (!) 97.4 F (36.3 C)   TempSrc:   Oral   SpO2: 99% 99%    Weight:      Height:        Intake/Output Summary  (Last 24 hours) at 04/03/2017 1449 Last data filed at 04/03/2017 1300 Gross per 24 hour  Intake 3340.32 ml  Output -  Net 3340.32 ml   Filed Weights   03/19/2017 1855 04/03/17 0358  Weight: 115.5 kg (254 lb 10.1 oz) 124.8 kg (275 lb 2.2 oz)    Examination:  General exam: Alert and comfortable, not in any kind of distress. Respiratory system: Clear to auscultation, no wheezing or rhonchi Cardiovascular system: tachycardic, irregular, no murmers can be appreciated.    Gastrointestinal system: Abdomen is soft nontender, nondistended with good bowel sounds Central nervous system: Alert and oriented.ptosis, lower extremity weakness.  Extremities: Pedal edema present, no cyanosis or clubbing Skin: No rashes, lesions or ulcers Psychiatry:  Mood & affect appropriate.     Data Reviewed: I have personally reviewed following labs and imaging studies  CBC: Recent Labs  Lab 03/12/2017 1038 03/31/2017 1309 03/31/17 0513 04/01/17 0430 04/29/2017 0500 04/03/17 0500  WBC 20.2* 18.5* 18.9* 25.2* 33.0* 42.3*  NEUTROABS 16.7* 14.0*  --   --  25.4*  --   HGB 9.5* 9.9* 8.5* 8.1* 7.7* 7.8*  HCT 28.9* 30.2* 25.5* 24.1* 23.1* 23.1*  MCV 85.0 85.8 85.0 84.0 83.1 81.1  PLT 122* 102* 124* 123* 124* 109*   Basic Metabolic Panel: Recent Labs  Lab 03/06/2017 1038 03/23/2017 1309 03/31/17 0513 03/31/17 1330 04/01/17 0430 04/17/2017 0500 04/03/17 0500  NA 139 141 141  --  141 140  --   K 3.4* 3.6 3.1*  --  4.4 4.3  --   CL 100 103 107  --  110 111  --   CO2 24 21* 22  --  20* 18*  --   GLUCOSE 98 89 87  --  87 128*  --   BUN 38* 38* 30*  --  28* 27*  --   CREATININE 2.48* 2.39* 2.22*  --  2.20* 2.04* 2.27*  CALCIUM 9.2 8.9 8.3*  --  8.6* 8.3*  --   MG  --   --   --  2.2 2.0  --   --    GFR: Estimated Creatinine Clearance: 30.2 mL/min (A) (by C-G formula based on SCr of 2.27 mg/dL (H)). Liver Function Tests: Recent Labs  Lab 03/23/2017 1038 03/26/2017 1309 03/31/17 0513  AST 45* 47* 51*  ALT 23 25 25    ALKPHOS 121 105 96  BILITOT 1.0 1.1 0.8  PROT 5.8* 5.5* 4.8*  ALBUMIN 2.9* 2.8* 2.4*   No results for input(s): LIPASE, AMYLASE in the last 168 hours. No results for input(s): AMMONIA in the last 168 hours. Coagulation Profile: No results for input(s): INR, PROTIME in the last 168 hours. Cardiac Enzymes: No results for input(s): CKTOTAL, CKMB, CKMBINDEX, TROPONINI in the last 168 hours. BNP (last 3 results) No results for input(s): PROBNP in the last 8760 hours. HbA1C: No results for input(s): HGBA1C in the last 72 hours.  CBG: No results for input(s): GLUCAP in the last 168 hours. Lipid Profile: No results for input(s): CHOL, HDL, LDLCALC, TRIG, CHOLHDL, LDLDIRECT in the last 72 hours. Thyroid Function Tests: No results for input(s): TSH, T4TOTAL, FREET4, T3FREE, THYROIDAB in the last 72 hours. Anemia Panel: No results for input(s): VITAMINB12, FOLATE, FERRITIN, TIBC, IRON, RETICCTPCT in the last 72 hours. Sepsis Labs: Recent Labs  Lab 03/25/2017 1523 03/31/17 1433 04/01/17 0850 04/01/17 1541  LATICACIDVEN 2.60* 3.1* 2.4* 1.9    Recent Results (from the past 240 hour(s))  Blood Culture (routine x 2)     Status: None (Preliminary result)   Collection Time: 03/06/2017  1:40 PM  Result Value Ref Range Status   Specimen Description   Final    BLOOD LEFT ANTECUBITAL Performed at [facility], [place] [address], [place], [place] [address]    Special Requests   Final    BOTTLES DRAWN AEROBIC AND ANAEROBIC Blood Culture results may not be optimal due to an inadequate volume of blood received in culture bottles Performed at [place] [address], [place], [place] [address]    Culture   Final    NO GROWTH 4 DAYS Performed at [facility], [place] [address], [place], [place] [address]    Report Status PENDING  Incomplete  MRSA PCR Screening     Status: None   Collection Time: 03/12/2017  6:56 PM  Result Value Ref Range Status   MRSA  by PCR NEGATIVE NEGATIVE Final    Comment:        The GeneXpert MRSA Assay (FDA approved for NASAL specimens only), is one component of a comprehensive MRSA colonization surveillance program. It is not intended to diagnose MRSA infection nor to guide or monitor treatment for MRSA infections. Performed at [facility], [place] [address], [place], [place] [address]   Blood Culture (routine x 2)     Status: None (Preliminary result)   Collection Time: 03/11/2017  7:14 PM  Result Value Ref Range Status   Specimen Description   Final    BLOOD RIGHT HAND Performed at [place] [address] [place], [place] [address]    Special Requests   Final    AEB BCLV Performed at [place] [address] [place], [place] [address]    Culture   Final    NO GROWTH 4 DAYS Performed at [facility], [place] [address], [place], [place] [address]    Report Status PENDING  Incomplete         Radiology Studies: Dg Fluoro Guide Lumbar Puncture  Result Date: 04/03/2017 CLINICAL DATA:  67 year old female with history of lymphoma and intrathecal chemotherapy presents with double vision, lower extremity weakness, atrial fibrillation with RVR. EXAM: DIAGNOSTIC LUMBAR PUNCTURE UNDER FLUOROSCOPIC GUIDANCE COMPARISON:  Lumbar MRI 02/08/2017. CT Abdomen and Pelvis 02/06/2017. FLUOROSCOPY TIME:  Fluoroscopy Time:  0 minutes 18 seconds Radiation Exposure Index (if provided by the fluoroscopic device): 2.8 mGy Number of Acquired Spot Images: 0 PROCEDURE: Informed consent was obtained from the patient prior to the procedure, including potential complications of headache, allergy, and pain. A "time-out" was performed. With the patient prone, the lower back was prepped with Betadine. 1% Lidocaine was used for local anesthesia. Lumbar puncture was performed at the L2-L3 level using a 6 in x 20 gauge needle with return of slightly yellow tinged CSF. The CSF  color appeared constant throughout the 4 tubes. Due  to immobility, CSF opening pressure with the patient decubitus was not performed, but subjectively the CSF pressure was normal, with slow CSF flow through the 6 in spinal needle. 17 mL of CSF were obtained for laboratory studies. The patient tolerated the procedure well and there were no apparent complications. Appropriate post procedural orders were placed on the chart. The patient was returned to the inpatient floor in stable condition for continued treatment. IMPRESSION: Fluoroscopic guided lumbar puncture at L2-L3. 17 mL of yellow-tinged CSF were obtained for laboratory studies. Electronically Signed   By: Genevie Ann M.D.   On: 04/03/2017 14:05        Scheduled Meds: . amiodarone  200 mg Oral BID  . Chlorhexidine Gluconate Cloth  6 each Topical Daily  . feeding supplement (ENSURE ENLIVE)  237 mL Oral BID BM  . lidocaine      . LORazepam  1 mg Intravenous Once  . metoprolol tartrate  25 mg Oral TID  . sodium chloride flush  10-40 mL Intracatheter Q12H  . sodium chloride flush  3 mL Intravenous Q12H   Continuous Infusions: . sodium chloride 100 mL/hr at 04/03/17 0530  . ceFEPime (MAXIPIME) IV    . diltiazem (CARDIZEM) infusion 15 mg/hr (04/03/17 1344)  . heparin    . vancomycin Stopped (04/01/17 1652)     LOS: 4 days    Time spent: 35 minutes.     Hosie Poisson, MD [facility] Pager [phone]   If 7PM-7AM, please contact night-coverage [email or web address] Password [facility] 04/03/2017, 2:49 PM

## 2017-04-03 NOTE — Progress Notes (Signed)
Spoke with on call cardiologist. Received orders to start cardizem drip without a bolus starting at 5mg /hr and titrating up to 15. Continue heparin and po amio at this time.

## 2017-04-03 NOTE — Progress Notes (Signed)
Pt now SVT 130's. Scheduled meds administered and infusing. Provider made aware. Order received (see MAR)

## 2017-04-03 NOTE — Progress Notes (Signed)
Pt. was taken down to MRI. 2 mg of Morphine was given to the patient to help with back pain ( Previous MRI attempt on 3.31.19 failed because pt complained of severe back pain when laid flat on the table). But pt. was unable to fit in to the MRI machine today. Pt. also brady'd  down to 40's while at the MRI. MD notified. Orders to get LP done today at 12:30. Later transfer to [facility].

## 2017-04-03 NOTE — Progress Notes (Signed)
Brief follow up with pt's family at end of day for continued support.  They are anticipating continued procedures this evening and tomorrow.

## 2017-04-03 NOTE — Progress Notes (Signed)
At 15:30 patient was asleep and brady'd down to the 30's. Patient was woken up by RN, appeared asymptomatic. Cardizem drip turned off. Now HR in the 80's. Cardiac strip showed low qrs voltage. MD notified. Ordered to keep Cardizem off.

## 2017-04-03 NOTE — Progress Notes (Addendum)
ANTICOAGULATION CONSULT NOTE - Follow Up Consult  Pharmacy Consult for heparin Indication: atrial fibrillation  No Known Allergies  Patient Measurements: Height: 5\' 1"  (154.9 cm) Weight: 275 lb 2.2 oz (124.8 kg) IBW/kg (Calculated) : 47.8 Heparin Dosing Weight: 76 kg  Vital Signs: Temp: 97.8 F (36.6 C) (04/02 0800) Temp Source: Oral (04/02 0800) BP: 124/92 (04/02 0800) Pulse Rate: 129 (04/02 0800)  Labs: Recent Labs    04/01/17 0430 04/01/17 0825 04/08/2017 0500 04/03/17 0500  HGB 8.1*  --  7.7* 7.8*  HCT 24.1*  --  23.1* 23.1*  PLT 123*  --  124* 121*  HEPARINUNFRC  --  0.51 0.61 0.53  CREATININE 2.20*  --  2.04* 2.27*    Estimated Creatinine Clearance: 30.2 mL/min (A) (by C-G formula based on SCr of 2.27 mg/dL (H)).   Assessment: Patient is a 67 y.o. F with hx lymphoma currently undergoing chemotherapy treatment presented to the ED on 03/15/2017 with afib with RVR. She's currently on heparin drip for afib.  Today, 04/03/2017: - heparin level remains therapeutic at 0.53 on heparin 1200 units/hr - Hgb decreased but stable/24h, plts low but stable - no bleeding reported - underwent TEE/DCCV 4/1 but converted back into afib 4/2 am - plans for LP at 11am.  Plan was to stop heparin at 7am today but appears oncall cardiology instructed RN to continue heparin after reverting back into afib.  D/w hospitalist this and heparin gtt off at 8:30am for LP   Goal of Therapy:  Heparin level 0.3-0.7 units/ml Monitor platelets by anticoagulation protocol: Yes   Plan:  - Heparin level therapeutic this am on heparin 1200 units/hr but heparin now off for LP later this am - f/u plan for when appropriate to resume heparin gtt.   - daily heparin level and CBC - monitor for s/s bleeding - await plan for long-term anticoagulation  Doreene Eland, PharmD, BCPS.   Pager: [phone] 04/03/2017 9:59 AM  ADDENDUM: - Diagnostic radiology called RN and stated OK to resume heparin gtt per  physician performing procedure.   - instructed RN to resume at 1600 at previous rate of 1200 units/hr  Doreene Eland, PharmD, BCPS.   Pager: [phone] 04/03/2017 2:29 PM

## 2017-04-03 NOTE — Progress Notes (Addendum)
Progress Note  Patient Name: Jo Collins Date of Encounter: 04/03/2017  Primary Cardiologist: Dr. Irish Lack  Subjective   No palpitation or chest pain. Mild dyspnea. Family at bedside.   Inpatient Medications    Scheduled Meds: . amiodarone  200 mg Oral BID  . Chlorhexidine Gluconate Cloth  6 each Topical Daily  . feeding supplement (ENSURE ENLIVE)  237 mL Oral BID BM  . LORazepam  1 mg Intravenous Once  . sodium chloride flush  10-40 mL Intracatheter Q12H  . sodium chloride flush  3 mL Intravenous Q12H   Continuous Infusions: . sodium chloride 100 mL/hr at 04/03/17 0530  . ceFEPime (MAXIPIME) IV    . diltiazem (CARDIZEM) infusion 5 mg/hr (04/03/17 1034)  . vancomycin Stopped (04/01/17 1652)   PRN Meds: acetaminophen, levalbuterol, morphine injection, ondansetron (ZOFRAN) IV, sodium chloride flush, sodium chloride flush, traZODone   Vital Signs    Vitals:   04/03/17 0700 04/03/17 0800 04/03/17 0815 04/03/17 0900  BP: 111/72 124/92  126/85  Pulse: 139 129 140 132  Resp: _0 Temp:  97.8 F (36.6 C)    TempSrc:  Oral    SpO2: 98% 98% 98% 99%  Weight:      Height:        Intake/Output Summary (Last 24 hours) at 04/03/2017 1102 Last data filed at 04/03/2017 1000 Gross per 24 hour  Intake 3008.48 ml  Output -  Net 3008.48 ml   Filed Weights   03/08/2017 1855 04/03/17 0358  Weight: 254 lb 10.1 oz (115.5 kg) 275 lb 2.2 oz (124.8 kg)    Telemetry    afib at rete of 130-140s - Personally Reviewed  ECG    N/A  Physical Exam   GEN: No acute distress.   Neck: No JVD Cardiac: Ir IR tachycardic, no murmurs, rubs, or gallops.  Respiratory: diminshed breath sound at base GI: Soft, nontender, non-distended  MS: No edema; No deformity. Neuro:  Nonfocal. Eye movement is not proper Psych: Normal affect   Labs    Chemistry Recent Labs  Lab 03/31/2017 1038  03/29/2017 1309 03/31/17 0513 04/01/17 0430 04/03/2017 0500 04/03/17 0500  NA 139  --  141 141 141  140  --   K 3.4*  --  3.6 3.1* 4.4 4.3  --   CL 100  --  103 107 110 111  --   CO2 24  --  21* 22 20* 18*  --   GLUCOSE 98  --  89 87 87 128*  --   BUN 38*  --  38* 30* 28* 27*  --   CREATININE 2.48*   < > 2.39* 2.22* 2.20* 2.04* 2.27*  CALCIUM 9.2  --  8.9 8.3* 8.6* 8.3*  --   PROT 5.8*  --  5.5* 4.8*  --   --   --   ALBUMIN 2.9*  --  2.8* 2.4*  --   --   --   AST 45*  --  47* 51*  --   --   --   ALT 23  --  25 25  --   --   --   ALKPHOS 121  --  105 96  --   --   --   BILITOT 1.0  --  1.1 0.8  --   --   --   GFRNONAA 19*   < > 20* 22* 22* 24* 21*  GFRAA 22*   < > 23* 25* 26* 28* 25*  ANIONGAP 15*  --  17* _0 --    < > = values in this interval not displayed.     Hematology Recent Labs  Lab 04/01/17 0430 04/20/2017 0500 04/03/17 0500  WBC 25.2* 33.0* 42.3*  RBC 2.87* 2.78* 2.85*  HGB 8.1* 7.7* 7.8*  HCT 24.1* 23.1* 23.1*  MCV 84.0 83.1 81.1  MCH 28.2 27.7 27.4  MCHC 33.6 33.3 33.8  RDW 18.7* 19.6* 20.1*  PLT 123* 124* 121*     Radiology    No results found.  Cardiac Studies   Echo 04/01/17 Study Conclusions  - HPI and indications: Atrial flutter 427.32. - Procedure narrative: Transthoracic echocardiography. Image quality was poor. The study was technically difficult, as a result of body habitus. - Left ventricle: The cavity size was normal. Wall thickness was increased in a pattern of moderate LVH. Systolic function was vigorous. The estimated ejection fraction was 75%. The study is not technically sufficient to allow evaluation of LV diastolic function. - Mitral valve: Calcified annulus. Mildly thickened leaflets . At least mild stenosis. There was trivial regurgitation. Valve area by continuity equation (using LVOT flow): 1.68 cm^2. - Left atrium: Moderately dilated. - Atrial septum: There was increased thickness of the septum, consistent with lipomatous hypertrophy. - Tricuspid valve: There was mild regurgitation. - Pulmonary  arteries: PA peak pressure: 32 mm Hg (S). - Inferior vena cava: The vessel was normal in size. The respirophasic diameter changes were in the normal range (>= 50%), consistent with normal central venous pressure.  Impressions:  - Technically difficult study. LVEF 75%, rapid atrial flutter is noted, moderate LVH, normal wall motion, MAC with at least mild stenosis and trivial MR, moderae LAE, mild TR, RVSP 32 mmHg, normal IVC, no pericardial effusion.  TEE/DCCV 04/08/2017 FINDINGS:  Mild biatrial dilation. Excellent left atrial appendage emptying velocities. No thrombus. LVEF>65%. Mild MR, mild TR. Normal aorta. No pericardial effusion.  Cardioverted 1 time(s).  Cardioversion with synchronized biphasic 120J shock.    Patient Profile      67 year old female wit history of diffuse large B-cell lymphoma, sciatica, GERD, morbid obesity who presented with acute renal failure, pancytopenia and concern for sepsis. We are asked to see her for the development of atrial fibrillation with rapid ventricular response.  Assessment & Plan    1. Atrial flutter - Rate elevated despite on IV amiodarone. Low blood pressure limiting addition of another agent. She is anticoagulated with IV heparin since onset this admission. Prior hx of PAF  But not on anticoagulation due to chemo. CHADSVASC score of 2.  - She had successful TEE cardioversion yesterday however went into afib/flutter RVR again this morning around 4 am. Rate elevated on IV Cardizem. Noted HR of 40s when she went down for MRI but no EKG or telemetry. HR sustained in 140s per tele. Up titrate Cardizem as blood pressure allows (currently stable). Continue po amiodarone. May switch to IV amiodarone if rate still remains elevated. Hopefully HR comes down as underlying issue resolves. IV heparin for anticoagulation.   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     For  questions or updates, please contact [place] Please consult [email or web address] for contact info under Cardiology/STEMI.      Signed, Leanor Kail, PA  04/03/2017, 11:02 AM    I have seen and examined the patient along with Leanor Kail, PA .  I have reviewed the chart, notes  and new data.  I agree with PA's note.  Key new complaints: she is unaware of the arrhythmia, has mild shortness of breath Key examination changes: irregular rapid rhythm, otherwise exam is obscured by morbid obesity Key new findings / data: normal rhythm lasted for about 16 hours only. Current rhythm is probably atrial fibrillation, maybe atypical flutter, but does not look like typical cavotricuspid isthmus dependent flutter  PLAN: Clearly premature to consider another cardioversion. Continue loading amiodarone and efforts to achieve better rate control. Add oral beta blocker to IV diltiazem, titrate as BP allows.  Sanda Klein, MD, [place] [phone] 04/03/2017, 12:29 PM

## 2017-04-03 NOTE — Progress Notes (Signed)
Subjective: No significant change subjectively today.  She is slightly short of breath.  That said she her O2 saturations are well within normal limits.  She does have a significantly high heart rate at 132-140 but no complaints of chest pain.  Exam: Vitals:   04/03/17 0700 04/03/17 0800  BP: 111/72 124/92  Pulse: 139 129  Resp: 15 25  Temp:  97.8 F (36.6 C)  SpO2: 98% 98%    Physical Exam   HEENT-  Normocephalic, no lesions, without obvious abnormality.  Ptosis of right eye Cardiovascular- S1-S2 audible, pulses palpable throughout   Lungs-no rhonchi or wheezing noted, no excessive working breathing.  Saturations within normal limits Abdomen- All 4 quadrants palpated and nontender Extremities- Warm, dry and intact Musculoskeletal-no joint tenderness, deformity or swelling Skin-warm and dry, no hyperpigmentation, vitiligo, or suspicious lesions    Neuro:  Mental Status: Alert, oriented, thought content appropriate.  Speech fluent without evidence of aphasia.  Able to follow 3 step commands without difficulty. Cranial Nerves: II:  Visual fields grossly normal,  III,IV, VI: Ptosis of right eye, today's noted that in the right eye she has no problem with external, superior, inferior ocular movements however she is unable to cross midline medially.  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 V,VII: smile symmetric, facial light touch sensation normal bilaterally VIII: hearing normal bilaterally IX,X: uvula rises symmetrically XI: bilateral shoulder shrug XII: midline tongue extension Motor: Generally she is weak thus has 4/5 strength throughout Sensory: Pinprick and light touch intact throughout, bilaterally Deep Tendon Reflexes: Depressed throughout Plantars: Right: downgoing   Left: downgoing Cerebellar: normal finger-to-nose, normal rapid alternating movements and normal heel-to-shin test Gait: Not tested     Medications:  Prior to Admission:  Medications Prior to Admission  Medication Sig Dispense Refill Last Dose  . acetaminophen (TYLENOL) 500 MG tablet Take 1,000 mg by mouth every 6 (six) hours as needed for moderate pain.   03/09/2017 at Unknown time  . dexamethasone (DECADRON) 4 MG tablet 40mg  po daily with breakfast from D1 to D4 each cycle of chemotherapy 40 tablet 2 Past Month at Unknown time  . gabapentin (NEURONTIN) 300 MG capsule 300mg  po with breakfast and lunch and 600mg  at bedtime. 120 capsule 0 04/01/2017 at Unknown time  . HYDROcodone-acetaminophen (NORCO) 5-325 MG tablet Take 1 tablet by mouth every 6 (six) hours as needed for severe pain. 20 tablet 0 Past Week at Unknown time  . predniSONE (DELTASONE) 10 MG tablet 6 TABLETS BY MOUTH DAILY FOR 5 DAYS STARTING 03/26/17  0 03/29/2017 at Unknown time  . traMADol (ULTRAM) 50 MG tablet Take 50-100 mg by mouth every 6 (six) hours as needed for moderate pain or severe pain.   03/29/2017 at Unknown time  . aspirin EC 81 MG tablet Take 1 tablet (81 mg total) by mouth daily. (Patient not taking: Reported on 03/09/2017) 90 tablet 3 Not Taking at Unknown time  . furosemide (LASIX) 20 MG tablet Take 1 tablet (20 mg total) by mouth daily. (Patient not taking: Reported on 03/16/2017) 30 tablet 1 Not Taking at Unknown time  . predniSONE (DELTASONE) 20 MG tablet 3 tabs po daily x 5 days STARTING ON 03/26/17 (Patient not taking: Reported on 03/25/2017) 15 tablet 0 Not Taking at Unknown time   Scheduled: . amiodarone  200 mg Oral BID  . Chlorhexidine Gluconate Cloth  6 each Topical Daily  . feeding supplement (ENSURE  ENLIVE)  237 mL Oral BID BM  . LORazepam  1 mg Intravenous Once  . sodium chloride flush  10-40 mL Intracatheter Q12H  . sodium chloride flush  3 mL Intravenous Q12H  . sodium chloride flush  3 mL Intravenous Q12H   Continuous: . sodium chloride 100 mL/hr at 04/03/17 0530  . sodium chloride    . sodium chloride    . ceFEPime (MAXIPIME) IV     . diltiazem (CARDIZEM) infusion 10 mg/hr (04/03/17 0754)  . vancomycin Stopped (04/01/17 1652)    Pertinent Labs/Diagnostics: Creatinine has bumped slightly from 2.04-2.27 GFR is 25 White blood cell count has jumped from 33-42.3 Due to overnight problems MRIs have not been done, this was due to pain medications and anxiety medications not ordered.  This problem has been taking care of this morning.  MRI will be done at 0 915 this morning Lumbar puncture is ordered for 11:00 this morning  No results found.   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  As noted prior etiology not fully clear, possibilities include microvascular 3rd nerve palsy, but other causes need to be ruled out.  MRI/MRA/MRV/LP all to be done today.       04/03/2017, 8:42 AM

## 2017-04-03 NOTE — Progress Notes (Signed)
HR still to 120-130, SBP 80-90. MD paged and orders given (see MAR)

## 2017-04-04 ENCOUNTER — Ambulatory Visit (HOSPITAL_COMMUNITY): Payer: Medicare Other

## 2017-04-04 ENCOUNTER — Inpatient Hospital Stay (HOSPITAL_COMMUNITY): Payer: Medicare Other

## 2017-04-04 DIAGNOSIS — D696 Thrombocytopenia, unspecified: Secondary | ICD-10-CM

## 2017-04-04 DIAGNOSIS — R4182 Altered mental status, unspecified: Secondary | ICD-10-CM

## 2017-04-04 DIAGNOSIS — R0902 Hypoxemia: Secondary | ICD-10-CM

## 2017-04-04 DIAGNOSIS — J90 Pleural effusion, not elsewhere classified: Secondary | ICD-10-CM

## 2017-04-04 DIAGNOSIS — C8338 Diffuse large B-cell lymphoma, lymph nodes of multiple sites: Secondary | ICD-10-CM

## 2017-04-04 DIAGNOSIS — E790 Hyperuricemia without signs of inflammatory arthritis and tophaceous disease: Secondary | ICD-10-CM

## 2017-04-04 DIAGNOSIS — J9601 Acute respiratory failure with hypoxia: Secondary | ICD-10-CM

## 2017-04-04 DIAGNOSIS — N179 Acute kidney failure, unspecified: Secondary | ICD-10-CM

## 2017-04-04 LAB — COMPREHENSIVE METABOLIC PANEL
ALT: 23 U/L (ref 14–54)
AST: 47 U/L — ABNORMAL HIGH (ref 15–41)
Albumin: 2.4 g/dL — ABNORMAL LOW (ref 3.5–5.0)
Alkaline Phosphatase: 101 U/L (ref 38–126)
Anion gap: 11 (ref 5–15)
BUN: 30 mg/dL — ABNORMAL HIGH (ref 6–20)
CHLORIDE: 116 mmol/L — AB (ref 101–111)
CO2: 13 mmol/L — ABNORMAL LOW (ref 22–32)
CREATININE: 2.41 mg/dL — AB (ref 0.44–1.00)
Calcium: 8 mg/dL — ABNORMAL LOW (ref 8.9–10.3)
GFR calc Af Amer: 23 mL/min — ABNORMAL LOW (ref >60)
GFR, EST NON AFRICAN AMERICAN: 20 mL/min — AB (ref >60)
Glucose, Bld: 94 mg/dL (ref 65–99)
Potassium: 3.8 mmol/L (ref 3.5–5.1)
Sodium: 140 mmol/L (ref 135–145)
Total Bilirubin: 0.8 mg/dL (ref 0.3–1.2)
Total Protein: 4.8 g/dL — ABNORMAL LOW (ref 6.5–8.1)

## 2017-04-04 LAB — RENAL FUNCTION PANEL
Albumin: 2.4 g/dL — ABNORMAL LOW (ref 3.5–5.0)
Anion gap: 11 (ref 5–15)
BUN: 32 mg/dL — AB (ref 6–20)
CALCIUM: 7.8 mg/dL — AB (ref 8.9–10.3)
CO2: 13 mmol/L — AB (ref 22–32)
Chloride: 114 mmol/L — ABNORMAL HIGH (ref 101–111)
Creatinine, Ser: 2.57 mg/dL — ABNORMAL HIGH (ref 0.44–1.00)
GFR calc Af Amer: 21 mL/min — ABNORMAL LOW (ref >60)
GFR calc non Af Amer: 18 mL/min — ABNORMAL LOW (ref >60)
GLUCOSE: 136 mg/dL — AB (ref 65–99)
Phosphorus: <1 mg/dL — CL (ref 2.5–4.6)
Potassium: 4 mmol/L (ref 3.5–5.1)
SODIUM: 138 mmol/L (ref 135–145)

## 2017-04-04 LAB — BLOOD GAS, ARTERIAL
Acid-base deficit: 11.3 mmol/L — ABNORMAL HIGH (ref 0.0–2.0)
BICARBONATE: 13.5 mmol/L — AB (ref 20.0–28.0)
DRAWN BY: 30136
FIO2: 32
O2 Saturation: 97.5 %
PH ART: 7.333 — AB (ref 7.350–7.450)
Patient temperature: 98
pCO2 arterial: 26 mmHg — ABNORMAL LOW (ref 32.0–48.0)
pO2, Arterial: 97.4 mmHg (ref 83.0–108.0)

## 2017-04-04 LAB — CBC WITH DIFFERENTIAL/PLATELET
BAND NEUTROPHILS: 10 %
BASOS ABS: 0 10*3/uL (ref 0.0–0.1)
BASOS PCT: 0 %
Band Neutrophils: 0 %
Basophils Absolute: 0 10*3/uL (ref 0.0–0.1)
Basophils Relative: 0 %
Blasts: 0 %
Blasts: 0 %
EOS ABS: 0 10*3/uL (ref 0.0–0.7)
EOS PCT: 0 %
Eosinophils Absolute: 0 10*3/uL (ref 0.0–0.7)
Eosinophils Relative: 0 %
HCT: 23.4 % — ABNORMAL LOW (ref 36.0–46.0)
HCT: 22.5 % — ABNORMAL LOW (ref 36.0–46.0)
Hemoglobin: 8.1 g/dL — ABNORMAL LOW (ref 12.0–15.0)
Hemoglobin: 7.9 g/dL — ABNORMAL LOW (ref 12.0–15.0)
LYMPHS ABS: 10 10*3/uL — AB (ref 0.7–4.0)
LYMPHS PCT: 7 %
Lymphocytes Relative: 24 %
Lymphs Abs: 2.7 10*3/uL (ref 0.7–4.0)
MCH: 27.9 pg (ref 26.0–34.0)
MCH: 28.4 pg (ref 26.0–34.0)
MCHC: 34.6 g/dL (ref 30.0–36.0)
MCHC: 35.1 g/dL (ref 30.0–36.0)
MCV: 80.7 fL (ref 78.0–100.0)
MCV: 80.9 fL (ref 78.0–100.0)
METAMYELOCYTES PCT: 0 %
METAMYELOCYTES PCT: 2 %
MONO ABS: 2.1 10*3/uL — AB (ref 0.1–1.0)
MONO ABS: 1.2 10*3/uL — AB (ref 0.1–1.0)
MONOS PCT: 5 %
Monocytes Relative: 3 %
Myelocytes: 0 %
Myelocytes: 4 %
NEUTROS PCT: 71 %
NRBC: 0 /100{WBCs}
Neutro Abs: 29.7 10*3/uL — ABNORMAL HIGH (ref 1.7–7.7)
Neutro Abs: 28.4 10*3/uL — ABNORMAL HIGH (ref 1.7–7.7)
Neutrophils Relative %: 57 %
OTHER: 17 %
PLATELETS: 93 10*3/uL — AB (ref 150–400)
PLATELETS: 83 10*3/uL — AB (ref 150–400)
PROMYELOCYTES ABS: 0 %
Promyelocytes Absolute: 0 %
RBC: 2.9 MIL/uL — ABNORMAL LOW (ref 3.87–5.11)
RBC: 2.78 MIL/uL — ABNORMAL LOW (ref 3.87–5.11)
RDW: 21.1 % — AB (ref 11.5–15.5)
RDW: 20.9 % — ABNORMAL HIGH (ref 11.5–15.5)
WBC: 41.8 10*3/uL — AB (ref 4.0–10.5)
WBC: 38.9 10*3/uL — ABNORMAL HIGH (ref 4.0–10.5)
nRBC: 0 /100 WBC

## 2017-04-04 LAB — MAGNESIUM: MAGNESIUM: 1.7 mg/dL (ref 1.7–2.4)

## 2017-04-04 LAB — BASIC METABOLIC PANEL
Anion gap: 11 (ref 5–15)
Anion gap: 12 (ref 5–15)
BUN: 33 mg/dL — AB (ref 6–20)
BUN: 35 mg/dL — AB (ref 6–20)
CO2: 13 mmol/L — ABNORMAL LOW (ref 22–32)
CO2: 15 mmol/L — ABNORMAL LOW (ref 22–32)
CREATININE: 2.57 mg/dL — AB (ref 0.44–1.00)
CREATININE: 2.57 mg/dL — AB (ref 0.44–1.00)
Calcium: 7.8 mg/dL — ABNORMAL LOW (ref 8.9–10.3)
Calcium: 7.9 mg/dL — ABNORMAL LOW (ref 8.9–10.3)
Chloride: 114 mmol/L — ABNORMAL HIGH (ref 101–111)
Chloride: 113 mmol/L — ABNORMAL HIGH (ref 101–111)
GFR calc Af Amer: 21 mL/min — ABNORMAL LOW (ref >60)
GFR calc Af Amer: 21 mL/min — ABNORMAL LOW (ref >60)
GFR, EST NON AFRICAN AMERICAN: 18 mL/min — AB (ref >60)
GFR, EST NON AFRICAN AMERICAN: 18 mL/min — AB (ref >60)
Glucose, Bld: 137 mg/dL — ABNORMAL HIGH (ref 65–99)
Glucose, Bld: 151 mg/dL — ABNORMAL HIGH (ref 65–99)
POTASSIUM: 4 mmol/L (ref 3.5–5.1)
POTASSIUM: 4.1 mmol/L (ref 3.5–5.1)
SODIUM: 138 mmol/L (ref 135–145)
SODIUM: 140 mmol/L (ref 135–145)

## 2017-04-04 LAB — CULTURE, BLOOD (ROUTINE X 2)
CULTURE: NO GROWTH
Culture: NO GROWTH

## 2017-04-04 LAB — URINALYSIS, COMPLETE (UACMP) WITH MICROSCOPIC
Bacteria, UA: NONE SEEN
Bilirubin Urine: NEGATIVE
GLUCOSE, UA: NEGATIVE mg/dL
KETONES UR: NEGATIVE mg/dL
Leukocytes, UA: NEGATIVE
Nitrite: NEGATIVE
PROTEIN: 30 mg/dL — AB
Specific Gravity, Urine: 1.011 (ref 1.005–1.030)
pH: 5 (ref 5.0–8.0)

## 2017-04-04 LAB — PATHOLOGIST SMEAR REVIEW

## 2017-04-04 LAB — LACTIC ACID, PLASMA: Lactic Acid, Venous: 1.9 mmol/L (ref 0.5–1.9)

## 2017-04-04 LAB — CREATININE, URINE, RANDOM: Creatinine, Urine: 43.08 mg/dL

## 2017-04-04 LAB — URIC ACID: Uric Acid, Serum: 8.7 mg/dL — ABNORMAL HIGH (ref 2.3–6.6)

## 2017-04-04 LAB — HEPARIN LEVEL (UNFRACTIONATED): Heparin Unfractionated: 0.27 IU/mL — ABNORMAL LOW (ref 0.30–0.70)

## 2017-04-04 LAB — SODIUM, URINE, RANDOM: Sodium, Ur: 95 mmol/L

## 2017-04-04 MED ORDER — STERILE WATER FOR INJECTION IV SOLN
INTRAVENOUS | Status: DC
  Administered 2017-04-04 – 2017-04-06 (×2): via INTRAVENOUS
  Filled 2017-04-04 (×12): qty 850

## 2017-04-04 MED ORDER — DIGOXIN 0.25 MG/ML IJ SOLN
0.1250 mg | Freq: Once | INTRAMUSCULAR | Status: AC
Start: 2017-04-04 — End: 2017-04-04
  Administered 2017-04-04: 0.125 mg via INTRAVENOUS
  Filled 2017-04-04 (×2): qty 2

## 2017-04-04 MED ORDER — SODIUM GLYCEROPHOSPHATE 1 MMOLE/ML IV SOLN
30.0000 mmol | Freq: Once | INTRAVENOUS | Status: AC
  Administered 2017-04-05: 30 mmol via INTRAVENOUS
  Filled 2017-04-04: qty 30

## 2017-04-04 MED ORDER — SODIUM CHLORIDE 0.9 % IV BOLUS
500.0000 mL | Freq: Once | INTRAVENOUS | Status: AC
Start: 2017-04-04 — End: 2017-04-04
  Administered 2017-04-04: 500 mL via INTRAVENOUS

## 2017-04-04 MED ORDER — LEVALBUTEROL HCL 0.63 MG/3ML IN NEBU
0.6300 mg | INHALATION_SOLUTION | Freq: Three times a day (TID) | RESPIRATORY_TRACT | Status: DC
Start: 2017-04-04 — End: 2017-04-06
  Administered 2017-04-04 – 2017-04-06 (×7): 0.63 mg via RESPIRATORY_TRACT
  Filled 2017-04-04 (×7): qty 3

## 2017-04-04 MED ORDER — AMIODARONE HCL IN DEXTROSE 360-4.14 MG/200ML-% IV SOLN
30.0000 mg/h | INTRAVENOUS | Status: DC
Start: 2017-04-04 — End: 2017-04-08
  Administered 2017-04-04 – 2017-04-08 (×3): 30 mg/h via INTRAVENOUS
  Filled 2017-04-04 (×8): qty 200

## 2017-04-04 MED ORDER — METHYLPREDNISOLONE SODIUM SUCC 125 MG IJ SOLR
125.0000 mg | Freq: Once | INTRAMUSCULAR | Status: AC
  Administered 2017-04-04: 125 mg via INTRAVENOUS
  Filled 2017-04-04: qty 2

## 2017-04-04 MED ORDER — METHYLPREDNISOLONE SODIUM SUCC 125 MG IJ SOLR
60.0000 mg | Freq: Three times a day (TID) | INTRAMUSCULAR | Status: DC
  Administered 2017-04-04 – 2017-04-07 (×8): 60 mg via INTRAVENOUS
  Filled 2017-04-04 (×8): qty 2

## 2017-04-04 MED ORDER — AMIODARONE HCL IN DEXTROSE 360-4.14 MG/200ML-% IV SOLN
60.0000 mg/h | INTRAVENOUS | Status: AC
  Administered 2017-04-04 (×2): 60 mg/h via INTRAVENOUS
  Filled 2017-04-04 (×2): qty 200

## 2017-04-04 MED ORDER — FUROSEMIDE 10 MG/ML IJ SOLN
80.0000 mg | Freq: Two times a day (BID) | INTRAMUSCULAR | Status: DC
  Administered 2017-04-04 – 2017-04-09 (×9): 80 mg via INTRAVENOUS
  Filled 2017-04-04 (×10): qty 8

## 2017-04-04 MED ORDER — AMIODARONE LOAD VIA INFUSION
150.0000 mg | Freq: Once | INTRAVENOUS | Status: AC
  Administered 2017-04-04: 150 mg via INTRAVENOUS
  Filled 2017-04-04: qty 83.34

## 2017-04-04 MED ORDER — SODIUM CHLORIDE 0.9 % IV SOLN
1.0000 g | INTRAVENOUS | Status: DC
  Administered 2017-04-04 – 2017-04-07 (×4): 1 g via INTRAVENOUS
  Filled 2017-04-04 (×4): qty 1

## 2017-04-04 MED ORDER — FUROSEMIDE 10 MG/ML IJ SOLN
40.0000 mg | Freq: Once | INTRAMUSCULAR | Status: AC
  Administered 2017-04-04: 40 mg via INTRAVENOUS
  Filled 2017-04-04: qty 4

## 2017-04-04 NOTE — Progress Notes (Signed)
Patient ID: Jo Collins, female   DOB: 12-18-1950, 67 y.o.   MRN: [id number]  PROGRESS NOTE    Tametra Ahart  LJQ:[id number] DOB: 30-Dec-1950 DOA: 03/21/2017 PCP: Deland Pretty, MD   Brief Narrative:  67 year old female with history of paroxysmal atrial fibrillation not on anticoagulation, large B-cell lymphoma with ongoing chemotherapy and recent tumor lysis syndrome resulting in acute renal failure anemia and hospitalization, morbid obesity, chronic back pain, prior biopsy of the right iliac crest in January and ongoing issues with low back pain with radiation down to the left leg with weakness and numbness was admitted on 04/01/2017 with complaints of leg pain, diplopia.  She was admitted for A. fib with RVR and possible sepsis from pneumonia.  She was started on antibiotics.  Cardiology was consulted.  He had a TEE and  cardioversion on 04/21/2017 but then converted back to A. fib.  Neurology was also consulted for neurological symptoms an MRI was ordered.  MRI could not be done at [facility] so she was transferred to [facility] on 04/03/2017.  She had LP done on 04/06/2017.  Oncology is also following.  Assessment & Plan:   Active Problems:   NHL (non-Hodgkin's lymphoma) (HCC)   Morbid obesity with BMI of 50.0-59.9, adult (HCC)   Diffuse large B cell lymphoma (HCC)   Recurrent right pleural effusion   Atrial flutter (HCC)   Sepsis (HCC)   Leukocytosis   Acute on chronic renal failure (HCC)   HCAP (healthcare-associated pneumonia)   3rd nerve palsy, complete, right   Hyperuricemia   Hypoxia/tachypnea/tachycardia -Probably from combination of pneumonia/volume overload/A. fib with RVR -Stat chest x-ray.  ABG.  Put the patient on BiPAP.  If condition does not improve, patient is okay for ventilation.  Might need pulmonary evaluation as well -Patient is extremely wheezing and is severely edematous.  Will give Solu-Medrol 125 mg IV x1 and Lasix 40 mg x1.  Blood pressure is on the  lower side.  If blood pressure allows and patient is making good urine output, might consider giving an additional dose of Lasix later on today. -Strict input and output.  Daily weights -Patient has a positive fluid balance of 18,004 mL since admission -Continue antibiotics -We will continue with Solu-Medrol 60 mg IV every 8 hours for now   Atrial flutter with RVR -Currently still tachycardic. -Cardiology following: Underwent TEE and DCCV on 04/04/2017 -Currently on Cardizem and amiodarone drips -Not on anticoagulation as an outpatient.  Currently on heparin drip  Sepsis from pneumonia -Cultures negative so far.  Continue antibiotics as below  Right-sided pneumonia with pleural effusion -Currently on cefepime and vancomycin.  No evidence of MRSA pneumonia.  Discontinue vancomycin.  If pleural effusion persists, might need thoracentesis -PCCM was initially on board and have signed off: If respiratory status worsens, might need reconsult  Leukocytosis -Worsening.  We will repeat stat labs in a.m. labs.  Probably from pneumonia/A. fib/hypoxia.  Acute renal failure -Questionable prerenal versus tumor lysis syndrome.  Baseline creatinine less than 1 -Creatinine remained elevated till 4-19.  No labs today.  Will repeat stat labs -UA was negative for infection and renal ultrasound was negative for hydronephrosis -Give Lasix as planned above. -If worsening renal function, will call nephrology  Right 3rd nerve palsy and left leg weakness in the setting of B-cell lymphoma -Neurology following.  Status post LP done on 04/03/2017 with cytology pending -MRI of the brain along with MRA/MRV of the head and MRI of the lumbosacral spine/sacral  axis as per neurology recommendations  Relapsed diffuse B-cell lymphoma -Oncology following. -Overall prognosis is guarded  Anemia and thrombocytopenia -Probably from lymphoma and chemotherapy -Transfuse to keep hemoglobin above 7 -No labs today  Morbid  obesity -Outpatient follow-up  DVT prophylaxis: Heparin drip Code Status: Full Family Communication: Spoke to husband and sister at bedside Disposition Plan: Depends on clinical outcome  Consultants: Neurology/cardiology/PCCM/oncology  Procedures: TEE/DCCV 04/19/2017 Mild biatrial dilation. Excellent left atrial appendage emptying velocities. No thrombus. LVEF>65%. Mild MR, mild TR. Normal aorta. No pericardial effusion.  Cardioverted1time(s).  Cardioversion with synchronized biphasic120Jshock.  Echo 04/01/17 Study Conclusions  - HPI and indications: Atrial flutter 427.32. - Procedure narrative: Transthoracic echocardiography. Image quality was poor. The study was technically difficult, as a result of body habitus. - Left ventricle: The cavity size was normal. Wall thickness was increased in a pattern of moderate LVH. Systolic function was vigorous. The estimated ejection fraction was 75%. The study is not technically sufficient to allow evaluation of LV diastolic function. - Mitral valve: Calcified annulus. Mildly thickened leaflets . At least mild stenosis. There was trivial regurgitation. Valve area by continuity equation (using LVOT flow): 1.68 cm^2. - Left atrium: Moderately dilated. - Atrial septum: There was increased thickness of the septum, consistent with lipomatous hypertrophy. - Tricuspid valve: There was mild regurgitation. - Pulmonary arteries: PA peak pressure: 32 mm Hg (S). - Inferior vena cava: The vessel was normal in size. The respirophasic diameter changes were in the normal range (>= 50%), consistent with normal central venous pressure.  Impressions:  - Technically difficult study. LVEF 75%, rapid atrial flutter is noted, moderate LVH, normal wall motion, MAC with at least mild stenosis and trivial MR, moderae LAE, mild TR, RVSP 32 mmHg, normal IVC, no pericardial effusion.   Antimicrobials:  Cefepime from  03/27/2017 onwards Vancomycin intermittently from 03/05/2017 onwards   Subjective: Patient seen and examined at bedside.  She is extremely short of breath and is having difficulty answering questions.  She is okay to be put on ventilator if needed.  Husband and sister at bedside.  No current chest pain.  Objective: Vitals:   04/04/17 0515 04/04/17 0804 04/04/17 0839 04/04/17 0906  BP: (!) 103/59 105/65  (!) 100/59  Pulse:  (!) 139  (!) 120  Resp: 20 20  (!) 25  Temp:  97.9 F (36.6 C)    TempSrc:  Axillary    SpO2: 99% 100% 97% 100%  Weight:      Height:        Intake/Output Summary (Last 24 hours) at 04/04/2017 0935 Last data filed at 04/04/2017 0401 Gross per 24 hour  Intake 1233.59 ml  Output 200 ml  Net 1033.59 ml   Filed Weights   03/20/2017 1855 04/03/17 0358  Weight: 115.5 kg (254 lb 10.1 oz) 124.8 kg (275 lb 2.2 oz)    Examination:  General exam: Extremely ill looking female lying in bed and very short of breath. Respiratory system: Bilateral decreased breath sound at bases scattered crackles and wheezing.  Tachypneic Cardiovascular system: S1 & S2 heard, tachycardic  gastrointestinal system: Abdomen is morbidly obese, nondistended, soft and nontender. Normal bowel sounds heard. Central nervous system: Awake but very short of breath. No focal neurological deficits. Moving extremities Extremities: No cyanosis, clubbing; 2-3+ pitting pedal edema  skin: No rashes, lesions or ulcers Psychiatry: Could not be assessed because of respiratory status.   Data Reviewed: I have personally reviewed following labs and imaging studies  CBC: Recent Labs  Lab 03/28/2017  1038 03/31/2017 1309 03/31/17 0513 04/01/17 0430 04/30/2017 0500 04/03/17 0500  WBC 20.2* 18.5* 18.9* 25.2* 33.0* 42.3*  NEUTROABS 16.7* 14.0*  --   --  25.4*  --   HGB 9.5* 9.9* 8.5* 8.1* 7.7* 7.8*  HCT 28.9* 30.2* 25.5* 24.1* 23.1* 23.1*  MCV 85.0 85.8 85.0 84.0 83.1 81.1  PLT 122* 102* 124* 123* 124* 121*    Basic Metabolic Panel: Recent Labs  Lab 03/02/2017 1038 03/18/2017 1309 03/31/17 0513 03/31/17 1330 04/01/17 0430 04/07/2017 0500 04/03/17 0500  NA 139 141 141  --  141 140  --   K 3.4* 3.6 3.1*  --  4.4 4.3  --   CL 100 103 107  --  110 111  --   CO2 24 21* 22  --  20* 18*  --   GLUCOSE 98 89 87  --  87 128*  --   BUN 38* 38* 30*  --  28* 27*  --   CREATININE 2.48* 2.39* 2.22*  --  2.20* 2.04* 2.27*  CALCIUM 9.2 8.9 8.3*  --  8.6* 8.3*  --   MG  --   --   --  2.2 2.0  --   --    GFR: Estimated Creatinine Clearance: 30.2 mL/min (A) (by C-G formula based on SCr of 2.27 mg/dL (H)). Liver Function Tests: Recent Labs  Lab 03/09/2017 1038 03/23/2017 1309 03/31/17 0513  AST 45* 47* 51*  ALT 23 25 25   ALKPHOS 121 105 96  BILITOT 1.0 1.1 0.8  PROT 5.8* 5.5* 4.8*  ALBUMIN 2.9* 2.8* 2.4*   No results for input(s): LIPASE, AMYLASE in the last 168 hours. No results for input(s): AMMONIA in the last 168 hours. Coagulation Profile: No results for input(s): INR, PROTIME in the last 168 hours. Cardiac Enzymes: No results for input(s): CKTOTAL, CKMB, CKMBINDEX, TROPONINI in the last 168 hours. BNP (last 3 results) No results for input(s): PROBNP in the last 8760 hours. HbA1C: No results for input(s): HGBA1C in the last 72 hours. CBG: No results for input(s): GLUCAP in the last 168 hours. Lipid Profile: No results for input(s): CHOL, HDL, LDLCALC, TRIG, CHOLHDL, LDLDIRECT in the last 72 hours. Thyroid Function Tests: No results for input(s): TSH, T4TOTAL, FREET4, T3FREE, THYROIDAB in the last 72 hours. Anemia Panel: No results for input(s): VITAMINB12, FOLATE, FERRITIN, TIBC, IRON, RETICCTPCT in the last 72 hours. Sepsis Labs: Recent Labs  Lab 03/05/2017 1523 03/31/17 1433 04/01/17 0850 04/01/17 1541  LATICACIDVEN 2.60* 3.1* 2.4* 1.9    Recent Results (from the past 240 hour(s))  Blood Culture (routine x 2)     Status: None (Preliminary result)   Collection Time: 03/14/2017  1:40  PM  Result Value Ref Range Status   Specimen Description   Final    BLOOD LEFT ANTECUBITAL Performed at [facility], [place] [address], [place], [place] [address]    Special Requests   Final    BOTTLES DRAWN AEROBIC AND ANAEROBIC Blood Culture results may not be optimal due to an inadequate volume of blood received in culture bottles Performed at [facility] [address] [place], [place] [address]    Culture   Final    NO GROWTH 4 DAYS Performed at [facility], [place] [address] [place], [place] [address]    Report Status PENDING  Incomplete  MRSA PCR Screening     Status: None   Collection Time: 03/09/2017  6:56 PM  Result Value Ref Range  Status   MRSA by PCR NEGATIVE NEGATIVE Final    Comment:        The GeneXpert MRSA Assay (FDA approved for NASAL specimens only), is one component of a comprehensive MRSA colonization surveillance program. It is not intended to diagnose MRSA infection nor to guide or monitor treatment for MRSA infections. Performed at [facility], [place] [address] [place], [place] [address]   Blood Culture (routine x 2)     Status: None (Preliminary result)   Collection Time: 03/03/2017  7:14 PM  Result Value Ref Range Status   Specimen Description   Final    BLOOD RIGHT HAND Performed at [facility] [address], [place], [place] [address]    Special Requests   Final    AEB BCLV Performed at [facility] [address], [place], [place] [address]    Culture   Final    NO GROWTH 4 DAYS Performed at [facility], [place] [address] [place],  [address]    Report Status PENDING  Incomplete  CSF culture     Status: None (Preliminary result)   Collection Time: 04/03/17  1:54 PM  Result Value Ref Range Status   Specimen Description CSF  Final   Special Requests NONE  Final   Gram Stain   Final    NO WBC SEEN NO ORGANISMS SEEN CYTOSPIN  SMEAR Gram Stain Report Called to,Read Back By and Verified With: E.MATHAI AT 1618 ON 04/03/17 BY N.THOMPSON Performed at [facility], [place] [address], [place],  [address]    Culture PENDING  Incomplete   Report Status PENDING  Incomplete         Radiology Studies: Dg Chest Port 1 View  Result Date: 04/04/2017 CLINICAL DATA:  Shortness of breath.  History of large cell lymphoma EXAM: PORTABLE CHEST 1 VIEW COMPARISON:  March 30, 2017 FINDINGS: Port-A-Cath tip is in the superior vena cava. No pneumothorax. There is a right pleural effusion with patchy airspace opacity throughout the right mid lower lung zones. The left lung is clear. Heart is upper normal in size with pulmonary vascularity within normal limits. No adenopathy evident. No bone lesions. IMPRESSION: Persistent pleural effusion on the right with areas of consolidation in portions of the right mid lower lung zones. Left lung remains clear. Stable cardiac silhouette. No new opacity evident. Electronically Signed   By: Lowella Grip III M.D.   On: 04/04/2017 09:20   Dg Fluoro Guide Lumbar Puncture  Result Date: 04/03/2017 CLINICAL DATA:  67 year old female with history of lymphoma and intrathecal chemotherapy presents with double vision, lower extremity weakness, atrial fibrillation with RVR. EXAM: DIAGNOSTIC LUMBAR PUNCTURE UNDER FLUOROSCOPIC GUIDANCE COMPARISON:  Lumbar MRI 02/08/2017. CT Abdomen and Pelvis 02/06/2017. FLUOROSCOPY TIME:  Fluoroscopy Time:  0 minutes 18 seconds Radiation Exposure Index (if provided by the fluoroscopic device): 2.8 mGy Number of Acquired Spot Images: 0 PROCEDURE: Informed consent was obtained from the patient prior to the procedure, including potential complications of headache, allergy, and pain. A "time-out" was performed. With the patient prone, the lower back was prepped with Betadine. 1% Lidocaine was used for local anesthesia. Lumbar puncture was performed at the L2-L3 level  using a 6 in x 20 gauge needle with return of slightly yellow tinged CSF. The CSF color appeared constant throughout the 4 tubes. Due to immobility, CSF opening pressure with the patient decubitus was not performed, but subjectively the CSF pressure was normal, with  slow CSF flow through the 6 in spinal needle. 17 mL of CSF were obtained for laboratory studies. The patient tolerated the procedure well and there were no apparent complications. Appropriate post procedural orders were placed on the chart. The patient was returned to the inpatient floor in stable condition for continued treatment. IMPRESSION: Fluoroscopic guided lumbar puncture at L2-L3. 17 mL of yellow-tinged CSF were obtained for laboratory studies. Electronically Signed   By: Genevie Ann M.D.   On: 04/03/2017 14:05        Scheduled Meds: . allopurinol  100 mg Oral BID  . Chlorhexidine Gluconate Cloth  6 each Topical Daily  . feeding supplement (ENSURE ENLIVE)  237 mL Oral BID BM  . levalbuterol  0.63 mg Nebulization TID  . LORazepam  1 mg Intravenous Once  . metoprolol tartrate  25 mg Oral TID  . sodium chloride flush  10-40 mL Intracatheter Q12H  . sodium chloride flush  3 mL Intravenous Q12H   Continuous Infusions: . sodium chloride 100 mL/hr at 04/03/17 1659  . amiodarone    . ceFEPime (MAXIPIME) IV Stopped (04/03/17 2347)  . diltiazem (CARDIZEM) infusion 10 mg/hr (04/04/17 0124)  . heparin 1,200 Units/hr (04/03/17 1659)  . vancomycin Stopped (04/03/17 2137)     LOS: 5 days        Aline August, MD [facility] Pager [phone]  If 7PM-7AM, please contact night-coverage [email or web address] Password [email or web address] 04/04/2017, 9:35 AM

## 2017-04-04 NOTE — Consult Note (Signed)
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 Patient respiratory  Status has been getting worse over the day and was put on BIPAP for increased work of breathing. Patient denies any cough, fever or chest pain.   Patient complains of seeing double and cant open her right eye. complain of pain in the left leg.   PAST MEDICAL HISTORY :  She  has a past medical history of Abdominal pain, RUQ, Abnormal EKG (08/07/2016), Acute midline thoracic back pain, Acute respiratory distress (09/25/2016), Anemia (08/07/2016), Chronic low back pain (09/25/2016), Cough, DLBCL (diffuse large B cell lymphoma) ([place]) (08/17/2016), GERD (gastroesophageal reflux disease), History of chemotherapy, Hypokalemia (09/25/2016), Intrathoracic mass (08/07/2016), Lymphadenopathy, Morbid obesity with BMI of 50.0-59.9, adult ([place]), Neutrophilic leukocytosis (62/02/2977), NHL (non-Hodgkin's lymphoma) (HCC), Pleural effusion on right (08/07/2016), Recurrent pleural effusion on right (10/08/2016), Recurrent right pleural effusion (09/25/2016), S/P thoracentesis, Shortness of breath (08/07/2016), and SOB (shortness of breath) (09/26/2016).  PAST SURGICAL HISTORY: She  has a past surgical history that includes Abdominal hysterectomy; IR THORACENTESIS ASP PLEURAL SPACE W/IMG GUIDE (08/07/2016); IR THORACENTESIS ASP PLEURAL SPACE W/IMG GUIDE (08/14/2016); IR FLUORO GUIDE PORT INSERTION RIGHT (08/18/2016); IR US Guide Vasc Access Right (08/18/2016); IR CV Line Injection (10/26/2016); IR US Guide Vasc Access Right (11/03/2016); IR FLUORO GUIDE PORT  INSERTION RIGHT (11/03/2016); IR Removal Tun Cv Cath W/O FL (11/03/2016); and Cardioversion (N/A, 04/11/2017).  No Known Allergies  No current facility-administered medications on file prior to encounter.    Current Outpatient Medications on File Prior to Encounter  Medication Sig  . acetaminophen (TYLENOL) 500 MG tablet Take 1,000 mg by mouth every 6 (six) hours as needed for moderate pain.  [name] dexamethasone (DECADRON) 4 MG tablet 40mg  po daily with breakfast from D1 to D4 each cycle of chemotherapy  . gabapentin (NEURONTIN) 300 MG capsule 300mg  po with breakfast and lunch and 600mg  at bedtime.  [name] HYDROcodone-acetaminophen (NORCO) 5-325 MG tablet Take 1 tablet by mouth every 6 (six) hours as needed for severe pain.  . predniSONE (DELTASONE) 10 MG tablet 6 TABLETS BY MOUTH DAILY FOR 5 DAYS STARTING 03/26/17  . traMADol (ULTRAM) 50 MG tablet Take 50-100 mg by mouth every 6 (six) hours as needed for moderate pain or severe pain.  [name] aspirin EC 81 MG tablet Take 1 tablet (81 mg total) by mouth daily. (Patient not taking: Reported on 03/15/2017)  . furosemide (LASIX) 20 MG tablet Take 1 tablet (20 mg total) by mouth daily. (Patient not taking: Reported on 03/06/2017)  . predniSONE (DELTASONE) 20 MG tablet 3 tabs po daily x 5 days STARTING ON 03/26/17 (Patient not taking: Reported on 03/13/2017)    . allopurinol  100 mg Oral BID  . Chlorhexidine Gluconate Cloth  6 each Topical Daily  . feeding supplement (ENSURE ENLIVE)  237 mL Oral BID BM  . furosemide  80 mg Intravenous Q12H  . levalbuterol  0.63 mg Nebulization TID  . LORazepam  1 mg Intravenous Once  . methylPREDNISolone (SOLU-MEDROL) injection  60 mg Intravenous Q8H  . metoprolol tartrate  25 mg  Oral TID  . sodium chloride flush  10-40 mL Intracatheter Q12H  . sodium chloride flush  3 mL Intravenous Q12H    Current Facility-Administered Medications:  .  acetaminophen (TYLENOL) tablet 650 mg, 650 mg, Oral, Q6H PRN, Hosie Poisson, MD, 650 mg at  04/01/17 0557 .  allopurinol (ZYLOPRIM) tablet 100 mg, 100 mg, Oral, BID, Irene Limbo, Cloria Spring, MD .  Margrett Rud amiodarone (NEXTERONE) 1.8 mg/mL load via infusion 150 mg, 150 mg, Intravenous, Once, 150 mg at 04/04/17 0350 **FOLLOWED BY** [EXPIRED] amiodarone (NEXTERONE PREMIX) 360-4.14 MG/200ML-% (1.8 mg/mL) IV infusion, 60 mg/hr, Intravenous, Continuous, Stopped at 04/04/17 1005 **FOLLOWED BY** amiodarone (NEXTERONE PREMIX) 360-4.14 MG/200ML-% (1.8 mg/mL) IV infusion, 30 mg/hr, Intravenous, Continuous, Kirby-Graham, Karsten Fells, NP, Last Rate: 16.7 mL/hr at 04/04/17 1006, 30 mg/hr at 04/04/17 1006 .  ceFEPIme (MAXIPIME) 1 g in sodium chloride 0.9 % 100 mL IVPB, 1 g, Intravenous, Q24H, Kris Mouton, RPH .  Chlorhexidine Gluconate Cloth 2 % PADS 6 each, 6 each, Topical, Daily, Hosie Poisson, MD, 6 each at 04/03/17 1029 .  diltiazem (CARDIZEM) 100 mg in dextrose 5% 133mL (1 mg/mL) infusion, 10 mg/hr, Intravenous, Titrated, Kirby-Graham, Karsten Fells, NP, Last Rate: 10 mL/hr at 04/04/17 1443, 10 mg/hr at 04/04/17 1443 .  feeding supplement (ENSURE ENLIVE) (ENSURE ENLIVE) liquid 237 mL, 237 mL, Oral, BID BM, Hosie Poisson, MD, Stopped at 04/03/17 1029 .  furosemide (LASIX) injection 80 mg, 80 mg, Intravenous, Q12H, Rondle Lohse Z, MD .  heparin ADULT infusion 100 units/mL (25000 units/231mL sodium chloride 0.45%), 1,300 Units/hr, Intravenous, Continuous, Kris Mouton, [facility], Last Rate: 13 mL/hr at 04/04/17 1216, 1,300 Units/hr at 04/04/17 1216 .  levalbuterol (XOPENEX) nebulizer solution 0.63 mg, 0.63 mg, Nebulization, Q8H PRN, Hosie Poisson, MD, 0.63 mg at 04/04/17 0514 .  levalbuterol (XOPENEX) nebulizer solution 0.63 mg, 0.63 mg, Nebulization, TID, Deland Pretty, MD, 0.63 mg at 04/04/17 1439 .  LORazepam (ATIVAN) injection 1 mg, 1 mg, Intravenous, Once, Hosie Poisson, MD .  methylPREDNISolone sodium succinate (SOLU-MEDROL) 125 mg/2 mL injection 60 mg, 60 mg, Intravenous, Q8H, Alekh, Kshitiz, MD .   metoprolol tartrate (LOPRESSOR) tablet 25 mg, 25 mg, Oral, TID, Hosie Poisson, MD, 25 mg at 04/03/17 2123 .  morphine 4 MG/ML injection 1-2 mg, 1-2 mg, Intravenous, Q4H PRN, Hosie Poisson, MD, 2 mg at 04/03/17 1933 .  ondansetron (ZOFRAN) injection 4 mg, 4 mg, Intravenous, Q6H PRN, Hosie Poisson, MD, 4 mg at 03/15/2017 2254 .  sodium bicarbonate 150 mEq in sterile water 1,000 mL infusion, , Intravenous, Continuous, Kennette Cuthrell Z, MD .  sodium chloride flush (NS) 0.9 % injection 10-40 mL, 10-40 mL, Intracatheter, Q12H, Hosie Poisson, MD, 10 mL at 04/03/17 2124 .  sodium chloride flush (NS) 0.9 % injection 10-40 mL, 10-40 mL, Intracatheter, PRN, Hosie Poisson, MD .  sodium chloride flush (NS) 0.9 % injection 3 mL, 3 mL, Intravenous, Q12H, Hosie Poisson, MD, 3 mL at 04/03/17 2124 .  sodium chloride flush (NS) 0.9 % injection 3 mL, 3 mL, Intravenous, PRN, Hosie Poisson, MD .  traZODone (DESYREL) tablet 50 mg, 50 mg, Oral, QHS PRN, Hosie Poisson, MD, 50 mg at 04/18/2017 2229    FAMILY HISTORY:  Her indicated that the status of her mother is unknown. She indicated that all of her four sisters are alive. She indicated that the status of her brother is unknown.   SOCIAL HISTORY: She  reports that she quit smoking about 8 months ago. Her smoking use included cigarettes. She has a  23.50 pack-year smoking history. She has never used smokeless tobacco. She reports that she does not drink alcohol or use drugs.  REVIEW OF SYSTEMS:   All 11 point system review were unremarkable other than what is mentioned in HPI    VITAL SIGNS: BP 109/71   Pulse (!) 139   Temp 98 F (36.7 C) (Axillary)   Resp (!) 33   Ht 5\' 1"  (1.549 m)   Wt 124.8 kg (275 lb 2.2 oz)   SpO2 100%   BMI 51.99 kg/m   HEMODYNAMICS:    VENTILATOR SETTINGS: FiO2 (%):  [40 %] 40 %  On BIPAP   INTAKE / OUTPUT: I/O last 3 completed shifts: In: 3019.9 [P.O.:480; I.V.:2339.9; IV Piggyback:200] Out: 200 [Urine:200]  PHYSICAL  EXAMINATION: General:  Looks acutely ill in mild distress on the BIPAP Neuro: alert oriented x3 right ptosis otherwise intact  HEENT: dry mucus membranes  Cardiovascular:  Normal heart sounds no murmrus  Lungs:  Coarse crackles bilaterally with decreased air sounds on the right  Abdomen:  Soft no tenderness  Musculoskeletal: ++ edema  Skin:  No rash   LABS:  BMET Recent Labs  Lab 04/01/17 0430 04/15/2017 0500 04/03/17 0500 04/04/17 0917  NA 141 140  --  140  K 4.4 4.3  --  3.8  CL 110 111  --  116*  CO2 20* 18*  --  13*  BUN 28* 27*  --  30*  CREATININE 2.20* 2.04* 2.27* 2.41*  GLUCOSE 87 128*  --  94    Electrolytes Recent Labs  Lab 03/31/17 1330 04/01/17 0430 04/06/2017 0500 04/04/17 0917  CALCIUM  --  8.6* 8.3* 8.0*  MG 2.2 2.0  --  1.7    CBC Recent Labs  Lab 04/04/2017 0500 04/03/17 0500 04/04/17 0917  WBC 33.0* 42.3* 41.8*  HGB 7.7* 7.8* 8.1*  HCT 23.1* 23.1* 23.4*  PLT 124* 121* 93*    Coag's No results for input(s): APTT, INR in the last 168 hours.  Sepsis Markers Recent Labs  Lab 03/31/17 1433 04/01/17 0850 04/01/17 1541  LATICACIDVEN 3.1* 2.4* 1.9    ABG Recent Labs  Lab 04/04/17 0841  PHART 7.333*  PCO2ART 26.0*  PO2ART 97.4    Liver Enzymes Recent Labs  Lab 03/03/2017 1309 03/31/17 0513 04/04/17 0917  AST 47* 51* 47*  ALT 25 25 23   ALKPHOS 105 96 101  BILITOT 1.1 0.8 0.8  ALBUMIN 2.8* 2.4* 2.4*    Cardiac Enzymes No results for input(s): TROPONINI, PROBNP in the last 168 hours.  Glucose No results for input(s): GLUCAP in the last 168 hours.  Imaging Dg Chest Port 1 View  Result Date: 04/04/2017 CLINICAL DATA:  Shortness of breath.  History of large cell lymphoma EXAM: PORTABLE CHEST 1 VIEW COMPARISON:  March 30, 2017 FINDINGS: Port-A-Cath tip is in the superior vena cava. No pneumothorax. There is a right pleural effusion with patchy airspace opacity throughout the right mid lower lung zones. The left lung is clear.  Heart is upper normal in size with pulmonary vascularity within normal limits. No adenopathy evident. No bone lesions. IMPRESSION: Persistent pleural effusion on the right with areas of consolidation in portions of the right mid lower lung zones. Left lung remains clear. Stable cardiac silhouette. No new opacity evident. Electronically Signed   By: Lowella Grip III M.D.   On: 04/04/2017 09:20      ANTIBIOTICS: Cefepime    SIGNIFICANT EVENTS: Worsening resp failure     DISCUSSION:  Mrs Corella [place] a 67 yr old lady with recurrent large B cell lymphoma coming in with acute renal failure severe metabolic acidosis, acute hypoxemic respiratory failure requiring non invasive mechanical ventilation, pulmonary edema and right pleural effusion with right chest mass. Patient is also in atrial flutter with RVR. 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   ASSESSMENT / PLAN:  PULMONARY A: Acute hypoxemic resp failure requiring non invasive mechanical ventilation Pulmonary edea Right pleural effusion  Right chest wall mass recurrent lymphoma  P:   - Continue BIPAP - lasix 80 mg stat - US chest to assess for pleural effusion. If significant, we will stop heparin drip and do thoracentesis  - start bicarb drip to help with her acidosis and compensatory hyperventilation  - high risk for intubation   CARDIOVASCULAR A:  Atrial flutter with RVR Acute pulm edema Acute diastolic heart failure  P:  - diurese - amio drip - cardizem drip - appreciate cards input - echo showed normal EF and no pericardial effusion    RENAL A:   Acute renal failure  Severe metabolic acidosis  Suspected  Tumor lysis syndrome   P:   - Appreciate nephrology input - start NaHCO3 drip - diurese with lasix, patient has significant peripheral edema and effusion  - might need HD if no improvement   GASTROINTESTINAL A:    P:   Npo for possible intubation   HEMATOLOGIC A:   Recurrent large B  cell lymphoma to right chest wall and pelvic lymph nodes   P:  Appreciate heme/onc input   INFECTIOUS A:   Health care associated pneumonia  P:   Cefepime   ENDOCRINE A:   No issues  P:     NEUROLOGIC A:   Suspected CNS recurrence Right ptosis  Leg weakness   P:   Appreciate neuro input    I have spent 40 mins of CC time bedside or in the unit exclusive of billable procedures   FAMILY  - Updates: family at bedside updated   - Inter-disciplinary family meet or Palliative Care meeting due by: 04/11/2017    Pulmonary and [place] Pager: [phone]  04/04/2017, 2:50 PM

## 2017-04-04 NOTE — Progress Notes (Signed)
MD paged to verify Amiodarone drip and Cardizem drip to run at same time. Per MD ok to continue both meds

## 2017-04-04 NOTE — Progress Notes (Addendum)
Subjective: SOB this morning.   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 EOMI on left. Motor: She has left leg weakness, 3/5 strength with involevment of dorsiflexion/plantarflexion and hip flexion.  Sensory:decreased to LT in the L4-5 distribution.   Pertinent Labs: CSF WBC 2 CSF protein 245 CSF glucose normal.  Cytology/Flow PENDING   Impression: 67 yo F with Right third nerve palsy and left leg weakness in the setting of B-Cell lymphoma. Unclear if her leg weakness could be polyradicular vs plexopathy. Especially with the elevated protein, my concern for lymphomatous CNS involvement is significantly higher. Unfortunately her renal function precludes contrast.   Recommendations: 1) F/U cytology 2) MRI brain/orbits, MRA/MRV head 3) Will need repeat L-spine/sacral plexus MRI as well, though lower priority than brain imaging  Roland Rack, MD [facility] [phone]  If 7pm- 7am, please page neurology on call as listed in [place].

## 2017-04-04 NOTE — Progress Notes (Signed)
pts phosphorus critically low at less than 1.0, on call nephrology paged to make aware, and verbal orders received for sodium phosphate 21mmol, when this RN attempted to put verbal order in-error msg stated that this med was on national backorder and could not be ordered at this time. MD paged for new orders. At this time there is not return phone call. Will monitor pt.

## 2017-04-04 NOTE — Progress Notes (Signed)
Paged Dr. Joelyn Oms to make aware of national back order of sodium phosphate.  Awaiting orders.  Continuing to monitor pt closely.

## 2017-04-04 NOTE — Progress Notes (Signed)
Progress Note  Patient Name: Jo Collins Date of Encounter: 04/04/2017  Primary Cardiologist: No primary care provider on file.   Subjective   She appears acutely ill, on BiPAP, but is able to ask and answer questions. Atrial flutter with 2: 1 AV block and a heart rate of 135 or so. Family at bedside. Neurological problems reviewed in Dr. Cecil Cobbs note.  Inpatient Medications    Scheduled Meds: . allopurinol  100 mg Oral BID  . Chlorhexidine Gluconate Cloth  6 each Topical Daily  . feeding supplement (ENSURE ENLIVE)  237 mL Oral BID BM  . levalbuterol  0.63 mg Nebulization TID  . LORazepam  1 mg Intravenous Once  . methylPREDNISolone (SOLU-MEDROL) injection  60 mg Intravenous Q8H  . metoprolol tartrate  25 mg Oral TID  . sodium chloride flush  10-40 mL Intracatheter Q12H  . sodium chloride flush  3 mL Intravenous Q12H   Continuous Infusions: . amiodarone 30 mg/hr (04/04/17 1006)  . ceFEPime (MAXIPIME) IV Stopped (04/03/17 2347)  . diltiazem (CARDIZEM) infusion 10 mg/hr (04/04/17 0124)  . heparin 1,200 Units/hr (04/03/17 1659)   PRN Meds: acetaminophen, levalbuterol, morphine injection, ondansetron (ZOFRAN) IV, sodium chloride flush, sodium chloride flush, traZODone   Vital Signs    Vitals:   04/04/17 0906 04/04/17 0915 04/04/17 0930 04/04/17 1000  BP: (!) 100/59 102/74 107/72 99/74  Pulse: (!) 120 (!) 137 (!) 138 (!) 139  Resp: (!) 25 (!) 25 20 (!) 29  Temp:      TempSrc:      SpO2: 100% 100% 100% 100%  Weight:      Height:        Intake/Output Summary (Last 24 hours) at 04/04/2017 1057 Last data filed at 04/04/2017 0401 Gross per 24 hour  Intake 1118.84 ml  Output 200 ml  Net 918.84 ml   Filed Weights   03/05/2017 1855 04/03/17 0358  Weight: 254 lb 10.1 oz (115.5 kg) 275 lb 2.2 oz (124.8 kg)    Telemetry    Atrial flutter with 2: 1 AV block- Personally Reviewed  ECG    No new tracing- Personally Reviewed  Physical Exam  Morbidly obese, looks  uncomfortable, on BiPAP GEN: No acute distress.   Neck:   Unable to evaluate JVD Cardiac:    very rapid but regular rhythm , no murmurs, rubs, or gallops.  Respiratory:   Wheezing bilaterally. GI: Soft, nontender, non-distended  MS: No edema; No deformity. Neuro:  Nonfocal  Psych: Normal affect   Labs    Chemistry Recent Labs  Lab 03/24/2017 1309 03/31/17 0513 04/01/17 0430 04/16/2017 0500 04/03/17 0500 04/04/17 0917  NA 141 141 141 140  --  140  K 3.6 3.1* 4.4 4.3  --  3.8  CL 103 107 110 111  --  116*  CO2 21* 22 20* 18*  --  13*  GLUCOSE 89 87 87 128*  --  94  BUN 38* 30* 28* 27*  --  30*  CREATININE 2.39* 2.22* 2.20* 2.04* 2.27* 2.41*  CALCIUM 8.9 8.3* 8.6* 8.3*  --  8.0*  PROT 5.5* 4.8*  --   --   --  4.8*  ALBUMIN 2.8* 2.4*  --   --   --  2.4*  AST 47* 51*  --   --   --  47*  ALT 25 25  --   --   --  23  ALKPHOS 105 96  --   --   --  101  BILITOT 1.1 0.8  --   --   --  0.8  GFRNONAA 20* 22* 22* 24* 21* 20*  GFRAA 23* 25* 26* 28* 25* 23*  ANIONGAP 17* _0 --  11     Hematology Recent Labs  Lab 04/27/2017 0500 04/03/17 0500 04/04/17 0917  WBC 33.0* 42.3* 41.8*  RBC 2.78* 2.85* 2.90*  HGB 7.7* 7.8* 8.1*  HCT 23.1* 23.1* 23.4*  MCV 83.1 81.1 80.7  MCH 27.7 27.4 27.9  MCHC 33.3 33.8 34.6  RDW 19.6* 20.1* 21.1*  PLT 124* 121* 93*    Cardiac EnzymesNo results for input(s): TROPONINI in the last 168 hours. No results for input(s): TROPIPOC in the last 168 hours.   BNPNo results for input(s): BNP, PROBNP in the last 168 hours.   DDimer No results for input(s): DDIMER in the last 168 hours.   Radiology    Dg Chest Port 1 View  Result Date: 04/04/2017 CLINICAL DATA:  Shortness of breath.  History of large cell lymphoma EXAM: PORTABLE CHEST 1 VIEW COMPARISON:  March 30, 2017 FINDINGS: Port-A-Cath tip is in the superior vena cava. No pneumothorax. There is a right pleural effusion with patchy airspace opacity throughout the right mid lower lung zones. The  left lung is clear. Heart is upper normal in size with pulmonary vascularity within normal limits. No adenopathy evident. No bone lesions. IMPRESSION: Persistent pleural effusion on the right with areas of consolidation in portions of the right mid lower lung zones. Left lung remains clear. Stable cardiac silhouette. No new opacity evident. Electronically Signed   By: Lowella Grip III M.D.   On: 04/04/2017 09:20   Dg Fluoro Guide Lumbar Puncture  Result Date: 04/03/2017 CLINICAL DATA:  67 year old female with history of lymphoma and intrathecal chemotherapy presents with double vision, lower extremity weakness, atrial fibrillation with RVR. EXAM: DIAGNOSTIC LUMBAR PUNCTURE UNDER FLUOROSCOPIC GUIDANCE COMPARISON:  Lumbar MRI 02/08/2017. CT Abdomen and Pelvis 02/06/2017. FLUOROSCOPY TIME:  Fluoroscopy Time:  0 minutes 18 seconds Radiation Exposure Index (if provided by the fluoroscopic device): 2.8 mGy Number of Acquired Spot Images: 0 PROCEDURE: Informed consent was obtained from the patient prior to the procedure, including potential complications of headache, allergy, and pain. A "time-out" was performed. With the patient prone, the lower back was prepped with Betadine. 1% Lidocaine was used for local anesthesia. Lumbar puncture was performed at the L2-L3 level using a 6 in x 20 gauge needle with return of slightly yellow tinged CSF. The CSF color appeared constant throughout the 4 tubes. Due to immobility, CSF opening pressure with the patient decubitus was not performed, but subjectively the CSF pressure was normal, with slow CSF flow through the 6 in spinal needle. 17 mL of CSF were obtained for laboratory studies. The patient tolerated the procedure well and there were no apparent complications. Appropriate post procedural orders were placed on the chart. The patient was returned to the inpatient floor in stable condition for continued treatment. IMPRESSION: Fluoroscopic guided lumbar puncture at L2-L3.  17 mL of yellow-tinged CSF were obtained for laboratory studies. Electronically Signed   By: Genevie Ann M.D.   On: 04/03/2017 14:05    Cardiac Studies   Echo 04/01/17 Study Conclusions  - HPI and indications: Atrial flutter 427.32. - Procedure narrative: Transthoracic echocardiography. Image quality was poor. The study was technically difficult, as a result of body habitus. - Left ventricle: The cavity size was normal. Wall thickness was increased in a pattern of moderate LVH. Systolic  function was vigorous. The estimated ejection fraction was 75%. The study is not technically sufficient to allow evaluation of LV diastolic function. - Mitral valve: Calcified annulus. Mildly thickened leaflets . At least mild stenosis. There was trivial regurgitation. Valve area by continuity equation (using LVOT flow): 1.68 cm^2. - Left atrium: Moderately dilated. - Atrial septum: There was increased thickness of the septum, consistent with lipomatous hypertrophy. - Tricuspid valve: There was mild regurgitation. - Pulmonary arteries: PA peak pressure: 32 mm Hg (S). - Inferior vena cava: The vessel was normal in size. The respirophasic diameter changes were in the normal range (>= 50%), consistent with normal central venous pressure.  Impressions:  - Technically difficult study. LVEF 75%, rapid atrial flutter is noted, moderate LVH, normal wall motion, MAC with at least mild stenosis and trivial MR, moderae LAE, mild TR, RVSP 32 mmHg, normal IVC, no pericardial effusion.  TEE/DCCV 04/08/2017 FINDINGS: Mild biatrial dilation. Excellent left atrial appendage emptying velocities. No thrombus. LVEF>65%. Mild MR, mild TR. Normal aorta. No pericardial effusion.  Cardioverted1time(s).  Cardioversion with synchronized biphasic120Jshock.   Patient Profile     67 y.o. female with history of diffuse large B-cell lymphoma, sciatica, GERD, morbid obesity who  presented with acute renal failure, pancytopenia and concern for pneumonia and sepsis. Underwent cardioversion for atrial flutter with rapid ventricular response on April 1, with return to atrial fibrillation roughly 16 hours later, now back in atrial flutter with 2: 1 AV block.  Assessment & Plan    1. AFlutter: No plans for another cardioversion until her respiratory status improves, due to the very high likelihood of return to atrial arrhythmia.  Ablation would be of little benefit since she has demonstrated that she has both atrial flutter and atrial fibrillation.  Continue anticoagulation, which will allow easier cardioversion in the future, if uninterrupted.  Continue amiodarone for rate control.  Additional rate control medications limited by low blood pressure.  Would like to avoid digoxin due to poor kidney function and amiodarone drug interaction. 2.  Acute respiratory failure with hypoxia: On BiPAP.  Unilateral lung opacity and pleural effusion on the chest x-ray is more consistent with an infectious or inflammatory process, rather than heart failure.  Has normal left ventricular systolic function. 3.  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  Morbid obesity  For questions or updates, please contact [place] Please consult [email or web address] for contact info under Cardiology/STEMI.      Signed, Sanda Klein, MD  04/04/2017, 10:57 AM

## 2017-04-04 NOTE — Progress Notes (Signed)
ANTICOAGULATION CONSULT NOTE - Follow Up Consult  Pharmacy Consult for heparin Indication: atrial fibrillation  No Known Allergies  Patient Measurements: Height: 5\' 1"  (154.9 cm) Weight: 275 lb 2.2 oz (124.8 kg) IBW/kg (Calculated) : 47.8 Heparin Dosing Weight: 76 kg  Vital Signs: Temp: 98 F (36.7 C) (04/03 0845) Temp Source: Axillary (04/03 0845) BP: 99/74 (04/03 1000) Pulse Rate: 139 (04/03 1000)  Labs: Recent Labs    04/27/2017 0500 04/03/17 0500 04/04/17 0500 04/04/17 0917  HGB 7.7* 7.8*  --  8.1*  HCT 23.1* 23.1*  --  23.4*  PLT 124* 121*  --  PENDING  HEPARINUNFRC 0.61 0.53 0.27*  --   CREATININE 2.04* 2.27*  --  2.41*    Estimated Creatinine Clearance: 28.5 mL/min (A) (by C-G formula based on SCr of 2.41 mg/dL (H)).   Assessment: Patient is a 67 y.o. F with hx lymphoma currently undergoing chemotherapy treatment presented to the ED on 03/18/2017 with afib with RVR s/p s/p DCCV on 4/1 but converted back to afib. She's currently on heparin drip for afib. She is now noted s/p LP.  -Heparin level is below goal on 1200 units/hr -plt= 121 (with with lymphoma on chemo)   Goal of Therapy:  Heparin level 0.3-0.7 units/ml Monitor platelets by anticoagulation protocol: Yes   Plan -Increase heparin to 1300 units/hr - daily heparin level and CBC - Will follow plans for long-term anticoagulation  Hildred Laser, PharmD Clinical Pharmacist Clinical phone from 8:30-4:00 is [phone] After 4pm, please call Main Rx [phone]) for assistance. 04/04/2017 10:48 AM

## 2017-04-04 NOTE — Progress Notes (Addendum)
HEMATOLOGY/ONCOLOGY INPATIENT PROGRESS NOTE  Date of Service: 04/04/2017  Inpatient Attending: .Aline August, MD   SUBJECTIVE:  Patient was seen with her husband at bedside. She was moved to [facility] given for MRI . She is back into atrial fibrillation after DCCV. Breathing has been labored and her mental status remains altered with rt eye occular palsy and left LE weakness/discomfort. Still with ARF. Currently has been on Bipap for most of the day. Discussed with hospitalist --concern for intubation. I had a detailed and extensive goals of care discussion with patients husband about her current grave condition and goals of care and have him help patient determine if she would be appropriate for intubation.   OBJECTIVE:   PHYSICAL EXAMINATION: . Vitals:   04/04/17 1500 04/04/17 1958 04/04/17 2000 04/04/17 2117  BP: 118/70 112/86 118/79   Pulse: (!) 137 (!) 133 (!) 138   Resp: 17 18 20    Temp:  98.1 F (36.7 C)    TempSrc:  Oral    SpO2: 100% 100% 100% 100%  Weight:      Height:       Filed Weights   03/04/2017 1855 04/03/17 0358  Weight: 254 lb 10.1 oz (115.5 kg) 275 lb 2.2 oz (124.8 kg)   .Body mass index is 51.99 kg/m.  GENERAL:lethargic, confused in resp distress on bipap EYES: puffy eyes OROPHARYNX: no exudate NECK: supple LYMPH:  no palpable lymphadenopathy in the cervical, axillary LUNGS: b/l scattered rhonci HEART: tachy S1S2, irreg ABDOMEN: abdomen obese, soft, distant breath sounds Musculoskeletal: 2+pedal edema b/l NEURO: no focal motor/sensory deficits, ? Left foot pain/ ?weakness.  MEDICAL HISTORY:  Past Medical History:  Diagnosis Date  . Abdominal pain, RUQ   . Abnormal EKG 08/07/2016  . Acute midline thoracic back pain   . Acute respiratory distress 09/25/2016  . Anemia 08/07/2016  . Chronic low back pain 09/25/2016  . Cough   . DLBCL (diffuse large B cell lymphoma) ([place]) 08/17/2016  . GERD (gastroesophageal reflux disease)   . History  of chemotherapy   . Hypokalemia 09/25/2016  . 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 Lymphadenopathy   . Morbid obesity with BMI of 50.0-59.9, adult ([place])   . Neutrophilic leukocytosis 29/09/2424  . NHL (non-Hodgkin's lymphoma) ([place])   . Pleural effusion on right 08/07/2016  . Recurrent pleural effusion on right 10/08/2016  . Recurrent right pleural effusion 09/25/2016  . S/P thoracentesis   . Shortness of breath 08/07/2016  . SOB (shortness of breath) 09/26/2016    SURGICAL HISTORY: Past Surgical History:  Procedure Laterality Date  . ABDOMINAL HYSTERECTOMY    . CARDIOVERSION N/A 04/13/2017   Procedure: CARDIOVERSION;  Surgeon: Sanda Klein, MD;  Location: [facility]S;  Service: Cardiovascular;  Laterality: N/A;  . IR CV LINE INJECTION  10/26/2016  . IR FLUORO GUIDE PORT INSERTION RIGHT  08/18/2016  . IR FLUORO GUIDE PORT INSERTION RIGHT  11/03/2016  . IR REMOVAL TUN CV CATH W/O FL  11/03/2016  . IR THORACENTESIS ASP PLEURAL SPACE W/IMG GUIDE  08/07/2016  . IR THORACENTESIS ASP PLEURAL SPACE W/IMG GUIDE  08/14/2016  . IR US GUIDE VASC ACCESS RIGHT  08/18/2016  . IR US GUIDE VASC ACCESS RIGHT  11/03/2016    SOCIAL HISTORY: Social History   Socioeconomic History  . Marital status: Married    Spouse name: Not on file  . Number of children: Not on file  . Years of education: Not on file  . Highest education level: Not on file  Occupational History  . Not on file  Social Needs  . Financial resource strain: Not hard at all  . Food insecurity:    Worry: Never true    Inability: Never true  . Transportation needs:    Medical: No    Non-medical: No  Tobacco Use  . Smoking status: Former Smoker    Packs/day: 0.50    Years: 47.00    Pack years: 23.50    Types: Cigarettes    Last attempt to quit: 07/28/2016    Years since quitting: 0.6  . Smokeless tobacco: Never Used  Substance and Sexual Activity  . Alcohol use: No  . Drug use: No  . Sexual activity: Not on file  Lifestyle  . Physical  activity:    Days per week: Patient refused    Minutes per session: Patient refused  . Stress: To some extent  Relationships  . Social connections:    Talks on phone: Not on file    Gets together: Not on file    Attends religious service: Not on file    Active member of club or organization: Not on file    Attends meetings of clubs or organizations: Not on file    Relationship status: Not on file  . Intimate partner violence:    Fear of current or ex partner: Not on file    Emotionally abused: Not on file    Physically abused: Not on file    Forced sexual activity: Not on file  Other Topics Concern  . Not on file  Social History Narrative  . Not on file    FAMILY HISTORY: Family History  Problem Relation Age of Onset  . COPD Mother   . Diabetes Mellitus II Sister   . Diabetes Mellitus II Brother   . Diabetes Mellitus II Sister   . Stroke Sister   . Hypertension Sister   . Diabetes Mellitus II Sister     ALLERGIES:  has No Known Allergies.  MEDICATIONS:  Scheduled Meds: . allopurinol  100 mg Oral BID  . Chlorhexidine Gluconate Cloth  6 each Topical Daily  . feeding supplement (ENSURE ENLIVE)  237 mL Oral BID BM  . furosemide  80 mg Intravenous Q12H  . levalbuterol  0.63 mg Nebulization TID  . LORazepam  1 mg Intravenous Once  . methylPREDNISolone (SOLU-MEDROL) injection  60 mg Intravenous Q8H  . metoprolol tartrate  25 mg Oral TID  . sodium chloride flush  10-40 mL Intracatheter Q12H  . sodium chloride flush  3 mL Intravenous Q12H   Continuous Infusions: . amiodarone 30 mg/hr (04/04/17 1900)  . ceFEPime (MAXIPIME) IV 1 g (04/04/17 2132)  . diltiazem (CARDIZEM) infusion 10 mg/hr (04/04/17 1900)  . heparin 1,300 Units/hr (04/04/17 1900)  .  sodium bicarbonate (isotonic) infusion in sterile water 75 mL/hr at 04/04/17 1900  . sodium glycerophosphate 0.9% NaCl IVPB     PRN Meds:.acetaminophen, levalbuterol, morphine injection, ondansetron (ZOFRAN) IV, sodium  chloride flush, sodium chloride flush, traZODone  REVIEW OF SYSTEMS:    10 Point review of Systems was done is negative except as noted above.   LABORATORY DATA:  I have reviewed the data as listed  . CBC Latest Ref Rng & Units 04/04/2017 04/04/2017 04/03/2017  WBC 4.0 - 10.5 K/uL 38.9(H) 41.8(H) 42.3(H)  Hemoglobin 12.0 - 15.0 g/dL 7.9(L) 8.1(L) 7.8(L)  Hematocrit 36.0 - 46.0 % 22.5(L) 23.4(L) 23.1(L)  Platelets 150 - 400 K/uL 83(L) 93(L) 121(L)    . CMP Latest Ref  Rng & Units 04/04/2017 04/04/2017 04/04/2017  Glucose 65 - 99 mg/dL 151(H) 136(H) 137(H)  BUN 6 - 20 mg/dL 35(H) 32(H) 33(H)  Creatinine 0.44 - 1.00 mg/dL 2.57(H) 2.57(H) 2.57(H)  Sodium 135 - 145 mmol/L 140 138 138  Potassium 3.5 - 5.1 mmol/L 4.1 4.0 4.0  Chloride 101 - 111 mmol/L 113(H) 114(H) 114(H)  CO2 22 - 32 mmol/L 15(L) 13(L) 13(L)  Calcium 8.9 - 10.3 mg/dL 7.9(L) 7.8(L) 7.8(L)  Total Protein 6.5 - 8.1 g/dL - - -  Total Bilirubin 0.3 - 1.2 mg/dL - - -  Alkaline Phos 38 - 126 U/L - - -  AST 15 - 41 U/L - - -  ALT 14 - 54 U/L - - -     RADIOGRAPHIC STUDIES: I have personally reviewed the radiological images as listed and agreed with the findings in the report. Ct Head Wo Contrast  Result Date: 03/15/2017 CLINICAL DATA:  Double vision. EXAM: CT HEAD WITHOUT CONTRAST TECHNIQUE: Contiguous axial images were obtained from the base of the skull through the vertex without intravenous contrast. COMPARISON:  MRI of February 06, 2017. FINDINGS: Brain: Mild chronic ischemic white matter disease is noted. No mass effect or midline shift is noted. Ventricular size is within normal limits. There is no evidence of mass lesion, hemorrhage or acute infarction. Vascular: No hyperdense vessel or unexpected calcification. Skull: Normal. Negative for fracture or focal lesion. Sinuses/Orbits: No acute finding. Other: None. IMPRESSION: Mild chronic ischemic white matter disease. No acute intracranial abnormality seen. Electronically Signed    By: Marijo Conception, M.D.   On: 03/15/2017 15:01   [place] Chest (pleural Effusion)  Result Date: 04/04/2017 CLINICAL DATA:  Follow-up right pleural effusion EXAM: CHEST ULTRASOUND COMPARISON:  None. FINDINGS: Small right pleural effusion is noted. No sizable component to allow for safe thoracentesis is noted. IMPRESSION: Small right-sided pleural effusion. Electronically Signed   By: Inez Catalina M.D.   On: 04/04/2017 15:10   US Renal  Result Date: 04/01/2017 CLINICAL DATA:  Acute renal failure. EXAM: RENAL / URINARY TRACT ULTRASOUND COMPLETE COMPARISON:  None. FINDINGS: Right Kidney: Length: 10.6 cm. Echogenicity within normal limits. No mass or hydronephrosis visualized. Left Kidney: Length: 11.3 cm. Echogenicity within normal limits. No mass or hydronephrosis visualized. Bladder: Appears normal for degree of bladder distention. IMPRESSION: Normal renal ultrasound. Electronically Signed   By: Fidela Salisbury M.D.   On: 04/01/2017 01:21   Dg Chest Port 1 View  Result Date: 04/04/2017 CLINICAL DATA:  Shortness of breath.  History of large cell lymphoma EXAM: PORTABLE CHEST 1 VIEW COMPARISON:  March 30, 2017 FINDINGS: Port-A-Cath tip is in the superior vena cava. No pneumothorax. There is a right pleural effusion with patchy airspace opacity throughout the right mid lower lung zones. The left lung is clear. Heart is upper normal in size with pulmonary vascularity within normal limits. No adenopathy evident. No bone lesions. IMPRESSION: Persistent pleural effusion on the right with areas of consolidation in portions of the right mid lower lung zones. Left lung remains clear. Stable cardiac silhouette. No new opacity evident. Electronically Signed   By: Lowella Grip III M.D.   On: 04/04/2017 09:20   Dg Chest Port 1 View  Result Date: 03/12/2017 CLINICAL DATA:  Tachycardia EXAM: PORTABLE CHEST 1 VIEW COMPARISON:  March 17, 2017 FINDINGS: Port-A-Cath tip is in the superior vena cava. No pneumothorax.  There is airspace consolidation in the right lower lobe with small right pleural effusion. Left lung is clear. Heart  is borderline enlarged with pulmonary vascularity within normal limits. No adenopathy. No bone lesions. IMPRESSION: Airspace consolidation consistent with pneumonia right lower lung zone with small right pleural effusion. Left lung clear. Stable cardiac silhouette. Port-A-Cath tip in superior vena cava. No pneumothorax. Electronically Signed   By: Lowella Grip III M.D.   On: 03/25/2017 14:06   Dg Chest Port 1 View  Result Date: 03/17/2017 CLINICAL DATA:  SOB today EXAM: PORTABLE CHEST 1 VIEW COMPARISON:  CT 02/06/2017 FINDINGS: Port in the anterior chest wall with tip in distal SVC. Normal cardiac silhouette. There is RIGHT pleural effusion and atelectasis. Upper lungs clear. No pneumothorax. IMPRESSION: 1. Persistent RIGHT pleural effusion and RIGHT basilar scarring. Chronic findings. 2. LEFT lung clear Electronically Signed   By: Suzy Bouchard M.D.   On: 03/17/2017 07:37   Dg Foot Complete Left  Result Date: 03/17/2017 CLINICAL DATA:  67 year old female with left foot pain for 1 month. Plantar and lateral aspect pain with no known injury. EXAM: LEFT FOOT - COMPLETE 3+ VIEW COMPARISON:  None. FINDINGS: Degenerative spurring of the plantar and dorsal calcaneus. Dorsal degenerative spurring throughout the tarsal bones, including dystrophic calcification or spurring along the medial anterior talus. Accessory ossicle adjacent to the cuboid. The metatarsals are intact. There is mild joint space loss and degenerative subchondral sclerosis at the 1st MTP and 1st IP joints. There is chronic resorption of the head of the 5th proximal phalanx. The 5th middle and distal phalanges appear normal. No acute osseous abnormality identified. Generalized soft tissue swelling. No subcutaneous gas. IMPRESSION: Chronic and degenerative osseous changes throughout the left foot. No acute osseous abnormality  identified. Generalized soft tissue swelling. Electronically Signed   By: Genevie Ann M.D.   On: 03/17/2017 15:40   Dg Fluoro Guide Lumbar Puncture  Result Date: 04/03/2017 CLINICAL DATA:  67 year old female with history of lymphoma and intrathecal chemotherapy presents with double vision, lower extremity weakness, atrial fibrillation with RVR. EXAM: DIAGNOSTIC LUMBAR PUNCTURE UNDER FLUOROSCOPIC GUIDANCE COMPARISON:  Lumbar MRI 02/08/2017. CT Abdomen and Pelvis 02/06/2017. FLUOROSCOPY TIME:  Fluoroscopy Time:  0 minutes 18 seconds Radiation Exposure Index (if provided by the fluoroscopic device): 2.8 mGy Number of Acquired Spot Images: 0 PROCEDURE: Informed consent was obtained from the patient prior to the procedure, including potential complications of headache, allergy, and pain. A "time-out" was performed. With the patient prone, the lower back was prepped with Betadine. 1% Lidocaine was used for local anesthesia. Lumbar puncture was performed at the L2-L3 level using a 6 in x 20 gauge needle with return of slightly yellow tinged CSF. The CSF color appeared constant throughout the 4 tubes. Due to immobility, CSF opening pressure with the patient decubitus was not performed, but subjectively the CSF pressure was normal, with slow CSF flow through the 6 in spinal needle. 17 mL of CSF were obtained for laboratory studies. The patient tolerated the procedure well and there were no apparent complications. Appropriate post procedural orders were placed on the chart. The patient was returned to the inpatient floor in stable condition for continued treatment. IMPRESSION: Fluoroscopic guided lumbar puncture at L2-L3. 17 mL of yellow-tinged CSF were obtained for laboratory studies. Electronically Signed   By: Genevie Ann M.D.   On: 04/03/2017 14:05    ASSESSMENT & PLAN:   67 y.o. with GERD, morbid obesity, likely sleep apnea, smoker recently quit with  #1 Relapsed Refractory DLBCL - now with chest wall mass and  increasing pelvic lymphadenopathy and progressively increasing LDH levels  and new anemia and thrombocytopenia with  BM Mx showing extensive involvement with relapsed/refractory DLBCL and PET/CT with chest wall mass. 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 Creatinine still elevated at 2.5  #6 RT occular swelling/diplopia and occulomotor palsy with increasing chin numbness--concerning for rt orbital lymphoma involvement and possible CNS involvement vs paraneoplastic process vs atypical infection. CSF  Component     Latest Ref Rng & Units 04/03/2017  Tube #      4  Color, CSF     COLORLESS COLORLESS  Appearance, CSF     CLEAR CLEAR  Supernatant      COLORLESS  RBC Count, CSF     0 /cu mm 63 (H)  WBC, CSF     0 - 5 /cu mm 2  Segmented Neutrophils-CSF     0 - 6 % TOO FEW TO COUNT, SMEAR AVAILABLE FOR REVIEW  Lymphs, CSF     40 - 80 % TOO FEW TO COUNT, SMEAR AVAILABLE FOR REVIEW  Monocyte-Macrophage-Spinal Fluid     15 - 45 % TOO FEW TO COUNT, SMEAR AVAILABLE FOR REVIEW  Other Cells, CSF      RARE LYMPHOCYTES SEEN  Specimen Description      CSF  Special Requests      NONE  Gram Stain      NO WBC SEEN . . .  Culture      PENDING  Report Status      PENDING  Glucose, CSF     40 - 70 mg/dL 77 (H)  Total  Protein, CSF     15 - 45 mg/dL 254 (H)  Increased protein but only 2 WBC Cytology -- neg for overt lymphoma  #7 Increased back pain  and left foot pain and?weakness --concern for possible lymphoma  Plan: -I had a detailed and extensive goals of  care discussion with patients husband about her current grave condition and goals of care and to help with appropriate decision making. -we discussed that even if she was diagnosed with CNS lymphoma she in her current condition would not to able to tolerate any possible treatments. Also her overall condition at this time precludes any mx of her aggressive and systemically active lymphoma as well. -discussed impression with hospitalist -still pending MRI studies -appreciate neurology input -CSF- albuminocytologic dissociation. Cytology- no overt evidence of lymphoma. Cultures pending. -appreciative input from cardiology and excellent hospital medicine cares. -palliative care f/u  I spent 30 minutes counseling the patient face to face. The total time spent in the appointment was 35 minutes and more than 50% was on counseling and direct patient cares.    Sullivan Lone MD [place] AAHIVMS [facility] Hematology/Oncology Physician [facility]  (Office):       [phone] (Work cell):  [phone] (Fax):           [phone]

## 2017-04-04 NOTE — Progress Notes (Signed)
Patient is off of bipap at this time. RT will assess closely and place patient back on bipap if needed.

## 2017-04-04 NOTE — Consult Note (Signed)
Reason for Consult: AKI Referring Physician: Starla Link, MD  Jo Collins is an 67 y.o. female.  HPI: Jo Collins is a 67 yo AAF with PMH significant for refractory, diffuse, large B-cell lymphoma who presented to Oncology clinic on 04/01/2017 and c/o diplopia and bilateral lower extremity pain.  She has been receiving gemcitabine, carboplatin, and dexamethasone.  She was noted to have a new chest wall mass, increasing pelvic lymphadenopathy, and progressively rising LDH with new anemia, thrombocytopenia, and ARF.  Her bone marrow biopsy revealed extensive involvement with relapsed/refractory DLBCL.  She was admitted to [facility] on 03/16/2017 for further evaluation and management over concern of worsening malignancy and possible tumor lysis syndrome.  She has been treated with vancomycin and cefepime as well as stress dose steroids.  Her ECHO revealed collapsing vena cava suggestive of volume depletion. No pericardial effusion.  She developed of atrial fib/flutter with RVR and had DCCV on 04/17/2017 but is now back in A fib/flutter.  She was transferred to [facility] for MRI to evaluate her neurologic symptoms.  Unfortunately she has become more short of breath and is currently on BiPap but continues to struggle.  We were consulted to help further evaluate her AKI.  The trend in Scr is seen below.       Trend in Creatinine: Creatinine, Ser  Date/Time Value Ref Range Status  04/04/2017 09:17 AM 2.41 (H) 0.44 - 1.00 mg/dL Final  04/03/2017 05:00 AM 2.27 (H) 0.44 - 1.00 mg/dL Final  04/16/2017 05:00 AM 2.04 (H) 0.44 - 1.00 mg/dL Final  04/01/2017 04:30 AM 2.20 (H) 0.44 - 1.00 mg/dL Final  03/31/2017 05:13 AM 2.22 (H) 0.44 - 1.00 mg/dL Final  03/06/2017 01:09 PM 2.39 (H) 0.44 - 1.00 mg/dL Final  03/13/2017 10:38 AM 2.48 (H) 0.60 - 1.10 mg/dL Final  03/21/2017 04:31 AM 1.63 (H) 0.44 - 1.00 mg/dL Final  03/20/2017 05:00 AM 1.69 (H) 0.44 - 1.00 mg/dL Final  03/19/2017 05:21 AM 1.86 (H) 0.44 - 1.00 mg/dL Final  03/18/2017 04:47 AM  2.15 (H) 0.44 - 1.00 mg/dL Final  03/17/2017 04:58 AM 2.49 (H) 0.44 - 1.00 mg/dL Final  03/16/2017 07:53 AM 2.92 (H) 0.60 - 1.10 mg/dL Final  03/09/2017 11:41 AM 1.20 (H) 0.60 - 1.10 mg/dL Final  02/21/2017 09:46 AM 0.91 0.60 - 1.10 mg/dL Final  02/07/2017 03:43 AM 0.93 0.44 - 1.00 mg/dL Final  02/06/2017 03:59 AM 0.87 0.44 - 1.00 mg/dL Final  11/03/2016 10:30 AM 0.77 0.44 - 1.00 mg/dL Final  10/10/2016 08:50 AM 0.83 0.44 - 1.00 mg/dL Final  10/09/2016 04:02 AM 0.79 0.44 - 1.00 mg/dL Final  10/08/2016 10:28 AM 0.91 0.44 - 1.00 mg/dL Final  10/07/2016 10:48 PM 0.98 0.44 - 1.00 mg/dL Final  10/05/2016 05:10 PM 0.79 0.44 - 1.00 mg/dL Final  09/28/2016 05:05 AM 0.82 0.44 - 1.00 mg/dL Final  09/26/2016 04:51 AM 0.94 0.44 - 1.00 mg/dL Final  09/25/2016 06:07 PM 0.92 0.44 - 1.00 mg/dL Final  08/18/2016 10:57 AM 1.07 (H) 0.44 - 1.00 mg/dL Final  08/13/2016 05:28 AM 0.86 0.44 - 1.00 mg/dL Final  08/09/2016 05:35 AM 0.83 0.44 - 1.00 mg/dL Final  08/08/2016 05:09 AM 0.92 0.44 - 1.00 mg/dL Final  08/06/2016 09:32 PM 1.09 (H) 0.44 - 1.00 mg/dL Final  07/31/2016 05:03 PM 0.90 0.44 - 1.00 mg/dL Final    PMH:   Past Medical History:  Diagnosis Date  . Abdominal pain, RUQ   . Abnormal EKG 08/07/2016  . Acute midline thoracic back pain   .  Acute respiratory distress 09/25/2016  . Anemia 08/07/2016  . Chronic low back pain 09/25/2016  . Cough   . DLBCL (diffuse large B cell lymphoma) ([place]) 08/17/2016  . GERD (gastroesophageal reflux disease)   . History of chemotherapy   . Hypokalemia 09/25/2016  . 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 Lymphadenopathy   . Morbid obesity with BMI of 50.0-59.9, adult ([place])   . Neutrophilic leukocytosis 81/02/7515  . NHL (non-Hodgkin's lymphoma) ([place])   . Pleural effusion on right 08/07/2016  . Recurrent pleural effusion on right 10/08/2016  . Recurrent right pleural effusion 09/25/2016  . S/P thoracentesis   . Shortness of breath 08/07/2016  . SOB (shortness of breath)  09/26/2016    PSH:   Past Surgical History:  Procedure Laterality Date  . ABDOMINAL HYSTERECTOMY    . CARDIOVERSION N/A 04/29/2017   Procedure: CARDIOVERSION;  Surgeon: Sanda Klein, MD;  Location: [facility] ORS;  Service: Cardiovascular;  Laterality: N/A;  . IR CV LINE INJECTION  10/26/2016  . IR FLUORO GUIDE PORT INSERTION RIGHT  08/18/2016  . IR FLUORO GUIDE PORT INSERTION RIGHT  11/03/2016  . IR REMOVAL TUN CV CATH W/O FL  11/03/2016  . IR THORACENTESIS ASP PLEURAL SPACE W/IMG GUIDE  08/07/2016  . IR THORACENTESIS ASP PLEURAL SPACE W/IMG GUIDE  08/14/2016  . IR US GUIDE VASC ACCESS RIGHT  08/18/2016  . IR US GUIDE VASC ACCESS RIGHT  11/03/2016    Allergies: No Known Allergies  Medications:   Prior to Admission medications   Medication Sig Start Date End Date Taking? Authorizing Provider  acetaminophen (TYLENOL) 500 MG tablet Take 1,000 mg by mouth every 6 (six) hours as needed for moderate pain.   Yes [provider]  dexamethasone (DECADRON) 4 MG tablet 37m po daily with breakfast from D1 to D4 each cycle of chemotherapy 03/09/17  Yes KBrunetta Genera MD  gabapentin (NEURONTIN) 300 MG capsule 3026mpo with breakfast and lunch and 60079mt bedtime. 03/09/17  Yes KalBrunetta GeneraD  HYDROcodone-acetaminophen (NORCO) 5-325 MG tablet Take 1 tablet by mouth every 6 (six) hours as needed for severe pain. 03/25/17  Yes Street, MerExcelloA-C  predniSONE (DELTASONE) 10 MG tablet 6 TABLETS BY MOUTH DAILY FOR 5 DAYS STARTING 03/26/17 03/26/17  Yes [provider]  traMADol (ULTRAM) 50 MG tablet Take 50-100 mg by mouth every 6 (six) hours as needed for moderate pain or severe pain.   Yes [provider]  aspirin EC 81 MG tablet Take 1 tablet (81 mg total) by mouth daily. Patient not taking: Reported on 04/01/2017 01/08/17   RosFay RecordsD  furosemide (LASIX) 20 MG tablet Take 1 tablet (20 mg total) by mouth daily. Patient not taking: Reported on 03/19/2017 03/09/17   KalBrunetta GeneraD  predniSONE (DELTASONE) 20 MG tablet 3 tabs po daily x 5 days STARTING ON 03/26/17 Patient not taking: Reported on 03/14/2017 03/25/17   Street, [place] Inpatient medications: . allopurinol  100 mg Oral BID  . Chlorhexidine Gluconate Cloth  6 each Topical Daily  . feeding supplement (ENSURE ENLIVE)  237 mL Oral BID BM  . furosemide  80 mg Intravenous Q12H  . levalbuterol  0.63 mg Nebulization TID  . LORazepam  1 mg Intravenous Once  . methylPREDNISolone (SOLU-MEDROL) injection  60 mg Intravenous Q8H  . metoprolol tartrate  25 mg Oral TID  . sodium chloride flush  10-40 mL Intracatheter Q12H  . sodium chloride flush  3  mL Intravenous Q12H    Discontinued Meds:   Medications Discontinued During This Encounter  Medication Reason  . acetaminophen (TYLENOL) tablet 650 mg   . amiodarone (NEXTERONE) 1.8 mg/mL load via infusion 150 mg Entry Error  . diltiazem (CARDIZEM) 100 mg in dextrose 5% 174m (1 mg/mL) infusion   . amiodarone (NEXTERONE) IV bolus only 150 mg/100 mL   . vancomycin (VANCOCIN) IVPB 750 mg/150 ml premix   . ceFEPIme (MAXIPIME) 1 g in sodium chloride 0.9 % 100 mL IVPB   . heparin ADULT infusion 100 units/mL (25000 units/2532msodium chloride 0.45%)   . morphine 4 MG/ML injection 2 mg   . amiodarone (NEXTERONE PREMIX) 360-4.14 MG/200ML-% (1.8 mg/mL) IV infusion   . promethazine (PHENERGAN) injection 6.4.65-68.1g P&T Policy: Duplicate PRN Therapy  . ceFEPIme (MAXIPIME) 2 g in sodium chloride 0.9 % 100 mL IVPB   . morphine 4 MG/ML injection 1 mg   . hydrocortisone sodium succinate (SOLU-CORTEF) 100 MG injection 100 mg   . 0.9 %  sodium chloride infusion   . sodium chloride flush (NS) 0.9 % injection 3 mL   . sodium chloride flush (NS) 0.9 % injection 3 mL   . 0.9 %  sodium chloride infusion   . amiodarone (PACERONE) tablet 200 mg   . 0.9 %  sodium chloride infusion   . vancomycin (VANCOCIN) IVPB 1000 mg/200 mL premix   . ceFEPIme (MAXIPIME) 2 g  in sodium chloride 0.9 % 100 mL IVPB     Social History:  reports that she quit smoking about 8 months ago. Her smoking use included cigarettes. She has a 23.50 pack-year smoking history. She has never used smokeless tobacco. She reports that she does not drink alcohol or use drugs.  Family History:   Family History  Problem Relation Age of Onset  . COPD Mother   . Diabetes Mellitus II Sister   . Diabetes Mellitus II Brother   . Diabetes Mellitus II Sister   . Stroke Sister   . Hypertension Sister   . Diabetes Mellitus II Sister     Review of systems not obtained due to patient factors. Weight change:   Intake/Output Summary (Last 24 hours) at 04/04/2017 1459 Last data filed at 04/04/2017 0401 Gross per 24 hour  Intake 790 ml  Output 200 ml  Net 590 ml   BP 109/71   Pulse (!) 139   Temp 98 F (36.7 C) (Axillary)   Resp (!) 33   Ht 5' 1"  (1.549 m)   Wt 124.8 kg (275 lb 2.2 oz)   SpO2 100%   BMI 51.99 kg/m  Vitals:   04/04/17 0930 04/04/17 1000 04/04/17 1137 04/04/17 1442  BP: 107/72 99/74 109/71   Pulse: (!) 138 (!) 139 (!) 139   Resp: 20 (!) 29 (!) 33   Temp:      TempSrc:      SpO2: 100% 100% 100% 100%  Weight:      Height:         General appearance: moderate distress, toxic and tachypnic and wearing BiPap Head: Normocephalic, without obvious abnormality, atraumatic Resp: bilateral scattered rhonchi and exp wheezes Cardio: tachycardic at 139, no rub GI: obese, +BS, soft, nontender Extremities: edema 2+ on left and 1 + on right lower extremities   Labs: Basic Metabolic Panel: Recent Labs  Lab 03/03/2017 1038 03/19/2017 1309 03/31/17 0513 04/01/17 0430 05/01/2017 0500 04/03/17 0500 04/04/17 0917  NA 139 141 141 141 140  --  140  K 3.4* 3.6 3.1* 4.4 4.3  --  3.8  CL 100 103 107 110 111  --  116*  CO2 24 21* 22 20* 18*  --  13*  GLUCOSE 98 89 87 87 128*  --  94  BUN 38* 38* 30* 28* 27*  --  30*  CREATININE 2.48* 2.39* 2.22* 2.20* 2.04* 2.27* 2.41*  ALBUMIN  2.9* 2.8* 2.4*  --   --   --  2.4*  CALCIUM 9.2 8.9 8.3* 8.6* 8.3*  --  8.0*   Liver Function Tests: Recent Labs  Lab 04/01/2017 1309 03/31/17 0513 04/04/17 0917  AST 47* 51* 47*  ALT 25 25 23   ALKPHOS 105 96 101  BILITOT 1.1 0.8 0.8  PROT 5.5* 4.8* 4.8*  ALBUMIN 2.8* 2.4* 2.4*   No results for input(s): LIPASE, AMYLASE in the last 168 hours. No results for input(s): AMMONIA in the last 168 hours. CBC: Recent Labs  Lab 03/22/2017 1038 03/24/2017 1309  04/01/17 0430 04/14/2017 0500 04/03/17 0500 04/04/17 0917  WBC 20.2* 18.5*   < > 25.2* 33.0* 42.3* 41.8*  NEUTROABS 16.7* 14.0*  --   --  25.4*  --  29.7*  HGB 9.5* 9.9*   < > 8.1* 7.7* 7.8* 8.1*  HCT 28.9* 30.2*   < > 24.1* 23.1* 23.1* 23.4*  MCV 85.0 85.8   < > 84.0 83.1 81.1 80.7  PLT 122* 102*   < > 123* 124* 121* 93*   < > = values in this interval not displayed.   PT/INR: @LABRCNTIP (inr:5) Cardiac Enzymes: )No results for input(s): CKTOTAL, CKMB, CKMBINDEX, TROPONINI in the last 168 hours. CBG: No results for input(s): GLUCAP in the last 168 hours.  Iron Studies: No results for input(s): IRON, TIBC, TRANSFERRIN, FERRITIN in the last 168 hours.  Xrays/Other Studies: Dg Chest Port 1 View  Result Date: 04/04/2017 CLINICAL DATA:  Shortness of breath.  History of large cell lymphoma EXAM: PORTABLE CHEST 1 VIEW COMPARISON:  March 30, 2017 FINDINGS: Port-A-Cath tip is in the superior vena cava. No pneumothorax. There is a right pleural effusion with patchy airspace opacity throughout the right mid lower lung zones. The left lung is clear. Heart is upper normal in size with pulmonary vascularity within normal limits. No adenopathy evident. No bone lesions. IMPRESSION: Persistent pleural effusion on the right with areas of consolidation in portions of the right mid lower lung zones. Left lung remains clear. Stable cardiac silhouette. No new opacity evident. Electronically Signed   By: Lowella Grip III M.D.   On: 04/04/2017 09:20    Dg Fluoro Guide Lumbar Puncture  Result Date: 04/03/2017 CLINICAL DATA:  67 year old female with history of lymphoma and intrathecal chemotherapy presents with double vision, lower extremity weakness, atrial fibrillation with RVR. EXAM: DIAGNOSTIC LUMBAR PUNCTURE UNDER FLUOROSCOPIC GUIDANCE COMPARISON:  Lumbar MRI 02/08/2017. CT Abdomen and Pelvis 02/06/2017. FLUOROSCOPY TIME:  Fluoroscopy Time:  0 minutes 18 seconds Radiation Exposure Index (if provided by the fluoroscopic device): 2.8 mGy Number of Acquired Spot Images: 0 PROCEDURE: Informed consent was obtained from the patient prior to the procedure, including potential complications of headache, allergy, and pain. A "time-out" was performed. With the patient prone, the lower back was prepped with Betadine. 1% Lidocaine was used for local anesthesia. Lumbar puncture was performed at the L2-L3 level using a 6 in x 20 gauge needle with return of slightly yellow tinged CSF. The CSF color appeared constant throughout the 4 tubes. Due to immobility, CSF opening pressure with the  patient decubitus was not performed, but subjectively the CSF pressure was normal, with slow CSF flow through the 6 in spinal needle. 17 mL of CSF were obtained for laboratory studies. The patient tolerated the procedure well and there were no apparent complications. Appropriate post procedural orders were placed on the chart. The patient was returned to the inpatient floor in stable condition for continued treatment. IMPRESSION: Fluoroscopic guided lumbar puncture at L2-L3. 17 mL of yellow-tinged CSF were obtained for laboratory studies. Electronically Signed   By: Genevie Ann M.D.   On: 04/03/2017 14:05     Assessment/Plan: 1.  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  Concerning for tumor lysis syndrome with hyperuricemia, however she has also been hypotensive and remains oliguric/anuric despite being 18 liters positive since admission.   Will check phos level and discussed possible renal replacement with the patient and her family.  Overall prognosis is poor and recommend palliative care consult to help set goals/limits of care.  She may require intubation and cvvhd if she continues to deteriorate but overall prognosis remains unchanged 2. Respiratory distress with acute hypoxemic respiratory failure- currently on BiPap but will likely require intubation soon.  Would recommend transfer to ICU if family plans to continue with aggressive measures as she may need CVVHD in the next 24 hours 3. Metabolic acidosis due to #1- agree with isotonic bicarb drip but with pulmonary edema and peripheral edema.  Ok to give lasix and bicarb and follow 4. Refractory DLBCL despite multiple chemo regimens.  Prognosis is grim 5. 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 Thrombocytopenia- due to malignancy 7. A flutter with RVR failed DCCV and now on cardizem drip with evidence of pulmonary edema and diastolic heart failure.  Cardiology following 8. 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 9. Disposition- overall prognosis is poor.  Recommend palliative care consult to help set goal/limits of care as she is currently a full code and probability of survival is low.  Consider transitioning to comfort care.   Governor Rooks Ariyona Eid 04/04/2017, 2:59 PM

## 2017-04-04 NOTE — Progress Notes (Signed)
Pharmacy Antibiotic Note  Jo Collins is a 67 y.o. female with PNA  Pharmacy has been consulted for cefepime dosing (day 5). -WBC= 41.8, afeb, SCr= 2.41 (up), CrCl ~ 30, cultures ngtd  Plan: -Change cefepime to 1gm IV q24hr -Consider a total of 7 days therapy? -Will follow renal function, cultures and clinical progress    Height: 5\' 1"  (154.9 cm) Weight: 275 lb 2.2 oz (124.8 kg) IBW/kg (Calculated) : 47.8  Temp (24hrs), Avg:97.8 F (36.6 C), Min:97.4 F (36.3 C), Max:98.2 F (36.8 C)  Recent Labs  Lab 03/17/2017 1335 03/08/2017 1523 03/31/17 0513 03/31/17 1433 04/01/17 0430 04/01/17 0850 04/01/17 1541 04/05/2017 0500 04/03/17 0500 04/04/17 0917  WBC  --   --  18.9*  --  25.2*  --   --  33.0* 42.3* 41.8*  CREATININE  --   --  2.22*  --  2.20*  --   --  2.04* 2.27* 2.41*  LATICACIDVEN 3.00* 2.60*  --  3.1*  --  2.4* 1.9  --   --   --     Estimated Creatinine Clearance: 28.5 mL/min (A) (by C-G formula based on SCr of 2.41 mg/dL (H)).    No Known Allergies  Antimicrobials this admission: vanc 3/29>> 4/3 Cefepime 3/29>>   Dose adjustments this admission:   Microbiology results: 3/29 at 1340 BCx1: NGTD 3/29 at 1914 bcx x1: NGTD 3/29 MRSA pcr: neg 4/2 CSF   Thank you for allowing pharmacy to be a part of this patient's care.  Hildred Laser, PharmD Clinical Pharmacist Clinical phone from 8:30-4:00 is [phone] After 4pm, please call Main Rx [phone]) for assistance. 04/04/2017 11:14 AM

## 2017-04-05 ENCOUNTER — Inpatient Hospital Stay (HOSPITAL_COMMUNITY): Payer: Medicare Other

## 2017-04-05 DIAGNOSIS — Z789 Other specified health status: Secondary | ICD-10-CM

## 2017-04-05 DIAGNOSIS — I4891 Unspecified atrial fibrillation: Secondary | ICD-10-CM

## 2017-04-05 DIAGNOSIS — Z515 Encounter for palliative care: Secondary | ICD-10-CM

## 2017-04-05 DIAGNOSIS — Z7189 Other specified counseling: Secondary | ICD-10-CM

## 2017-04-05 LAB — HEPARIN LEVEL (UNFRACTIONATED): Heparin Unfractionated: 0.53 [IU]/mL (ref 0.30–0.70)

## 2017-04-05 LAB — CBC
HCT: 23 % — ABNORMAL LOW (ref 36.0–46.0)
HEMOGLOBIN: 8.1 g/dL — AB (ref 12.0–15.0)
MCH: 28 pg (ref 26.0–34.0)
MCHC: 35.2 g/dL (ref 30.0–36.0)
MCV: 79.6 fL (ref 78.0–100.0)
Platelets: 96 10*3/uL — ABNORMAL LOW (ref 150–400)
RBC: 2.89 MIL/uL — ABNORMAL LOW (ref 3.87–5.11)
RDW: 21.2 % — AB (ref 11.5–15.5)
WBC: 47.3 10*3/uL — ABNORMAL HIGH (ref 4.0–10.5)

## 2017-04-05 LAB — RENAL FUNCTION PANEL
ALBUMIN: 2.4 g/dL — AB (ref 3.5–5.0)
ANION GAP: 13 (ref 5–15)
BUN: 38 mg/dL — AB (ref 6–20)
CHLORIDE: 112 mmol/L — AB (ref 101–111)
CO2: 16 mmol/L — ABNORMAL LOW (ref 22–32)
Calcium: 7.6 mg/dL — ABNORMAL LOW (ref 8.9–10.3)
Creatinine, Ser: 2.55 mg/dL — ABNORMAL HIGH (ref 0.44–1.00)
GFR calc Af Amer: 21 mL/min — ABNORMAL LOW (ref >60)
GFR calc non Af Amer: 19 mL/min — ABNORMAL LOW (ref >60)
GLUCOSE: 153 mg/dL — AB (ref 65–99)
PHOSPHORUS: 1 mg/dL — AB (ref 2.5–4.6)
POTASSIUM: 3.6 mmol/L (ref 3.5–5.1)
Sodium: 141 mmol/L (ref 135–145)

## 2017-04-05 LAB — BASIC METABOLIC PANEL
Anion gap: 14 (ref 5–15)
Anion gap: 13 (ref 5–15)
BUN: 40 mg/dL — ABNORMAL HIGH (ref 6–20)
BUN: 41 mg/dL — ABNORMAL HIGH (ref 6–20)
CALCIUM: 7.4 mg/dL — AB (ref 8.9–10.3)
CALCIUM: 7.1 mg/dL — AB (ref 8.9–10.3)
CO2: 18 mmol/L — ABNORMAL LOW (ref 22–32)
CO2: 19 mmol/L — ABNORMAL LOW (ref 22–32)
Chloride: 110 mmol/L (ref 101–111)
Chloride: 108 mmol/L (ref 101–111)
Creatinine, Ser: 2.6 mg/dL — ABNORMAL HIGH (ref 0.44–1.00)
Creatinine, Ser: 2.6 mg/dL — ABNORMAL HIGH (ref 0.44–1.00)
GFR, EST AFRICAN AMERICAN: 21 mL/min — AB (ref >60)
GFR, EST AFRICAN AMERICAN: 21 mL/min — AB (ref >60)
GFR, EST NON AFRICAN AMERICAN: 18 mL/min — AB (ref >60)
GFR, EST NON AFRICAN AMERICAN: 18 mL/min — AB (ref >60)
Glucose, Bld: 168 mg/dL — ABNORMAL HIGH (ref 65–99)
Glucose, Bld: 176 mg/dL — ABNORMAL HIGH (ref 65–99)
POTASSIUM: 3.2 mmol/L — AB (ref 3.5–5.1)
POTASSIUM: 3.3 mmol/L — AB (ref 3.5–5.1)
Sodium: 142 mmol/L (ref 135–145)
Sodium: 140 mmol/L (ref 135–145)

## 2017-04-05 LAB — URIC ACID, RANDOM URINE: URIC ACID, URINE: 37.8 mg/dL

## 2017-04-05 LAB — SAVE SMEAR

## 2017-04-05 MED ORDER — BOOST / RESOURCE BREEZE PO LIQD CUSTOM
1.0000 | Freq: Two times a day (BID) | ORAL | Status: DC
  Administered 2017-04-06 – 2017-04-09 (×4): 1 via ORAL

## 2017-04-05 MED ORDER — METOPROLOL TARTRATE 25 MG PO TABS
25.0000 mg | ORAL_TABLET | Freq: Four times a day (QID) | ORAL | Status: DC
  Administered 2017-04-05 – 2017-04-12 (×26): 25 mg via ORAL
  Filled 2017-04-05 (×27): qty 1

## 2017-04-05 MED ORDER — FENTANYL CITRATE (PF) 100 MCG/2ML IJ SOLN
12.5000 ug | INTRAMUSCULAR | Status: DC | PRN
  Administered 2017-04-05: 25 ug via INTRAVENOUS
  Filled 2017-04-05: qty 2

## 2017-04-05 MED ORDER — POTASSIUM PHOSPHATES 15 MMOLE/5ML IV SOLN
30.0000 mmol | Freq: Once | INTRAVENOUS | Status: AC
  Administered 2017-04-05: 30 mmol via INTRAVENOUS
  Filled 2017-04-05: qty 10

## 2017-04-05 MED ORDER — FENTANYL CITRATE (PF) 100 MCG/2ML IJ SOLN: 12.5000 ug | INTRAMUSCULAR | Status: DC | PRN

## 2017-04-05 MED ORDER — BOOST / RESOURCE BREEZE PO LIQD CUSTOM: 1.0000 | Freq: Three times a day (TID) | ORAL | Status: DC

## 2017-04-05 MED ORDER — SENNA 8.6 MG PO TABS
2.0000 | ORAL_TABLET | Freq: Every day | ORAL | Status: DC
  Administered 2017-04-05 – 2017-04-09 (×5): 17.2 mg via ORAL
  Filled 2017-04-05 (×5): qty 2

## 2017-04-05 NOTE — Consult Note (Signed)
Consultation Note Date: 04/05/2017   Patient Name: Jo Collins  DOB: Jul 07, 1950  MRN: [id number]  Age / Sex: 67 y.o., female  PCP: Deland Pretty, MD Referring Physician: Aline August, MD  Reason for Consultation: Establishing goals of care and Psychosocial/spiritual support  HPI/Patient Profile: 67 y.o. female  with past medical history of recurrent B cell lymphoma followed by Dr. Irene Limbo, recurrent pleural effusion, obesity, chronic low back pain who was admitted on 03/09/2017 with leg weakness, ptosis, sepsis.  Initial work up revealed HCAP, afib with RVR, and acute kidney injury. Her respiratory status declined and she required Bipap.  Her pulse has been difficult to control.  She is tachycardic in the 130s today despite amio and diltiazem drips.  Her kidney function has declined over the past 6 weeks from a baseline of 0.9 on 2/20 to 2.5.  Recent imaging of her chest abdomen and pelvis indicates progression of the lymphoma.  There is concern that she may have brain metastases.  Clinical Assessment and Goals of Care:  I have reviewed medical records including EPIC notes, labs and imaging, received report from the care team, assessed the patient and then met at the bedside along with her sister Gregary Signs and husband Pilar Plate.  All questions are to be directed to [place].  The patient is very confused.     I introduced Palliative Medicine as specialized medical care for people living with serious illness. It focuses on providing relief from the symptoms and stress of a serious illness. The goal is to improve quality of life for both the patient and the family.  We discussed a brief life review of the patient. She has 7 siblings and 4 children.  Pilar Plate proudly tells me that while she was a mother of 4 he watched her graduate high school, obtain her associates degree, her bachelor's degree, her Masters and her Engineer, maintenance (IT).  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  Even in the hospital she has been guiding her employees on the phone with work issues.  Mrs. Zeiter is able to tell me she is very spiritual and belongs to the [employer].  She states "I walked in here, I'm walking out of here".   Mr. Battiste Pilar Plate) and I left the bedside to talk in more detail.  He does not want her to repeatedly be told how sick she is. We discussed the lymphoma, her kidneys and her heart.  Pilar Plate understands she is very sick.  He tells me Dr. Irene Limbo has visited twice and explained to the family that if the cancer is in her brain then there is nothing that can be done.  Pilar Plate accepts this information.    We discussed code status.  Pilar Plate does not want her intubated because he does not want her to die on a machine.  A doctor recently told him if his wife was intubated she would not live to come off of the machine.  With regards to CPR and  defibrillation, Pilar Plate would like for [place] to "try one time".  If she didn't revive quickly he would want the code to be short.   We discussed feeding tubes.  Pilar Plate would never want Keriann to have a feeding tube.    If Danahi does not have lymphoma in her brain - then Pilar Plate would consider hemodialysis and other aggressive interventions.  However he understands that if the cancer has spread to her brain there would be no further interventions.  We talked about the MRI.  Per Laddie Aquas could not do the MRI because of back pain and because she rubbed up again the sides of the MRI at [facility].  He feels if the MRI were short - [facility] could likely do it.     Primary Decision Maker:  NEXT OF KIN Husband, Pilar Plate    SUMMARY OF RECOMMENDATIONS    Providers please talk with Pilar Plate outside of the room.  He does not want Laysha to hear repeatedly how sick she is. Change code status to DNI.   No PEG Up to chair as soon as patient can tolerate it Senna QHS.  Patient has not had a BM  since admission per Pilar Plate. Chaplain services requested. Discussed with Dr. Leonel Ramsay.  If we can only get 1 test - get MR brain and MRA Scheduled tylenol for pain Fentanyl 12.5 - 25 mcg for severe pain.  (Will D/C morphine as fentanyl is short acting and does less harm to the kidneys)  Code Status/Advance Care Planning:  DNI   Symptom Management:   Will add resource breeze  Additional Recommendations (Limitations, Scope, Preferences):  Full Scope Treatment  Palliative Prophylaxis:   Aspiration and Delirium Protocol  Psycho-social/Spiritual:   Desire for further Chaplaincy support: yes  Prognosis:  Very poor given large tumor burden, likely CNS involvement of lymphoma, progressive kidney failure, declining respiratory status, confusion, Afib with RVR refractory to treatment.   Discharge Planning: To Be Determined      Primary Diagnoses: Present on Admission: . Recurrent right pleural effusion . NHL (non-Hodgkin's lymphoma) ([place]) . Diffuse large B cell lymphoma ([place])   I have reviewed the medical record, interviewed the patient and family, and examined the patient. The following aspects are pertinent.  Past Medical History:  Diagnosis Date  . Abdominal pain, RUQ   . Abnormal EKG 08/07/2016  . Acute midline thoracic back pain   . Acute respiratory distress 09/25/2016  . Anemia 08/07/2016  . Chronic low back pain 09/25/2016  . Cough   . DLBCL (diffuse large B cell lymphoma) ([place]) 08/17/2016  . GERD (gastroesophageal reflux disease)   . History of chemotherapy   . Hypokalemia 09/25/2016  . 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 Lymphadenopathy   . Morbid obesity with BMI of 50.0-59.9, adult ([place])   . Neutrophilic leukocytosis 17/0/0174  . NHL (non-Hodgkin's lymphoma) ([place])   . Pleural effusion on right 08/07/2016  . Recurrent pleural effusion on right 10/08/2016  . Recurrent right pleural effusion 09/25/2016  . S/P thoracentesis   . Shortness of breath 08/07/2016  . SOB  (shortness of breath) 09/26/2016   Social History   Socioeconomic History  . Marital status: Married    Spouse name: Not on file  . Number of children: Not on file  . Years of education: Not on file  . Highest education level: Not on file  Occupational History  . Not on file  Social Needs  . Financial resource strain: Not hard at all  . Food insecurity:  Worry: Never true    Inability: Never true  . Transportation needs:    Medical: No    Non-medical: No  Tobacco Use  . Smoking status: Former Smoker    Packs/day: 0.50    Years: 47.00    Pack years: 23.50    Types: Cigarettes    Last attempt to quit: 07/28/2016    Years since quitting: 0.6  . Smokeless tobacco: Never Used  Substance and Sexual Activity  . Alcohol use: No  . Drug use: No  . Sexual activity: Not on file  Lifestyle  . Physical activity:    Days per week: Patient refused    Minutes per session: Patient refused  . Stress: To some extent  Relationships  . Social connections:    Talks on phone: Not on file    Gets together: Not on file    Attends religious service: Not on file    Active member of club or organization: Not on file    Attends meetings of clubs or organizations: Not on file    Relationship status: Not on file  Other Topics Concern  . Not on file  Social History Narrative  . Not on file   Family History  Problem Relation Age of Onset  . COPD Mother   . Diabetes Mellitus II Sister   . Diabetes Mellitus II Brother   . Diabetes Mellitus II Sister   . Stroke Sister   . Hypertension Sister   . Diabetes Mellitus II Sister    Scheduled Meds: . allopurinol  100 mg Oral BID  . Chlorhexidine Gluconate Cloth  6 each Topical Daily  . feeding supplement  1 Container Oral BID BM  . furosemide  80 mg Intravenous Q12H  . levalbuterol  0.63 mg Nebulization TID  . LORazepam  1 mg Intravenous Once  . methylPREDNISolone (SOLU-MEDROL) injection  60 mg Intravenous Q8H  . metoprolol tartrate  25 mg  Oral Q6H  . senna  2 tablet Oral QHS  . sodium chloride flush  10-40 mL Intracatheter Q12H  . sodium chloride flush  3 mL Intravenous Q12H   Continuous Infusions: . amiodarone 30 mg/hr (04/04/17 1900)  . ceFEPime (MAXIPIME) IV Stopped (04/04/17 2202)  . diltiazem (CARDIZEM) infusion 10 mg/hr (04/04/17 1900)  . heparin 1,300 Units/hr (04/04/17 1900)  . potassium PHOSPHATE IVPB (mmol) 30 mmol (04/05/17 1200)  .  sodium bicarbonate (isotonic) infusion in sterile water 125 mL/hr at 04/05/17 0942   PRN Meds:.acetaminophen, fentaNYL (SUBLIMAZE) injection, levalbuterol, ondansetron (ZOFRAN) IV, sodium chloride flush, sodium chloride flush, traZODone No Known Allergies Review of Systems patient confused and SOB unable to provide.  Physical Exam  Obese female with right eye partially closed.  She is confused and SOB at rest CV tachycardic resp + wheeze.  Uneven anterior chest wall Abdomen soft, nd, nt   Vital Signs: BP 109/64 (BP Location: Left Arm)   Pulse (!) 143   Temp 98.4 F (36.9 C) (Oral)   Resp 16   Ht 5' 1"  (1.549 m)   Wt 124.8 kg (275 lb 2.2 oz)   SpO2 98%   BMI 51.99 kg/m  Pain Scale: 0-10 POSS *See Group Information*: 1-Acceptable,Awake and alert Pain Score: 0-No pain   SpO2: SpO2: 98 % O2 Device:SpO2: 98 % O2 Flow Rate: .O2 Flow Rate (L/min): (S) 1 L/min  IO: Intake/output summary:   Intake/Output Summary (Last 24 hours) at 04/05/2017 1540 Last data filed at 04/05/2017 1130 Gross per 24 hour  Intake 892.25 ml  Output 2275 ml  Net -1382.75 ml    LBM: Last BM Date: 03/21/17 Baseline Weight: Weight: 115.5 kg (254 lb 10.1 oz) Most recent weight: Weight: 124.8 kg (275 lb 2.2 oz)     Palliative Assessment/Data:20%     Time In: 12:30 Time Out: 1:40 Time Total: 70 min. Greater than 50%  of this time was spent counseling and coordinating care related to the above assessment and plan.  Signed by: Florentina Jenny, PA-C Palliative Medicine Pager:  [phone]  Please contact Palliative Medicine Team phone at [phone] for questions and concerns.  For individual provider: See Shea Evans

## 2017-04-05 NOTE — Progress Notes (Addendum)
Subjective: Continues to be SOB but denies any diplopia  Exam: Vitals:   04/05/17 0500 04/05/17 0719  BP: 124/74 100/77  Pulse: (!) 125 (!) 136  Resp: (!) 22 (!) 22  Temp:  97.8 F (36.6 C)  SpO2: 100% 100%    Physical Exam   HEENT-  Normocephalic, no lesions, without obvious abnormality.  Normal external eye and conjunctiva.   Cardiovascular- S1-S2 audible, pulses palpable throughout   Lungs-no rhonchi or wheezing noted, no excessive working breathing.  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 EOMI on left. Motor: She continues to have  left leg weakness she has 3/5 strength proximally, 1-2/5 distally similar to yesterday, 4/5 in the right leg Sensory: She actually endorses intact sensation today      Medications:  Scheduled: . allopurinol  100 mg Oral BID  . Chlorhexidine Gluconate Cloth  6 each Topical Daily  . feeding supplement (ENSURE ENLIVE)  237 mL Oral BID BM  . furosemide  80 mg Intravenous Q12H  . levalbuterol  0.63 mg Nebulization TID  . LORazepam  1 mg Intravenous Once  . methylPREDNISolone (SOLU-MEDROL) injection  60 mg Intravenous Q8H  . metoprolol tartrate  25 mg Oral TID  . sodium chloride flush  10-40 mL Intracatheter Q12H  . sodium chloride flush  3 mL Intravenous Q12H   Continuous: . amiodarone 30 mg/hr (04/04/17 1900)  . ceFEPime (MAXIPIME) IV Stopped (04/04/17 2202)  . diltiazem (CARDIZEM) infusion 10 mg/hr (04/04/17 1900)  . heparin 1,300 Units/hr (04/04/17 1900)  . potassium PHOSPHATE  IVPB (mmol)    .  sodium bicarbonate (isotonic) infusion in sterile water 125 mL/hr at 04/05/17 9678   LFY:BOFBPZWCHENID, levalbuterol, morphine injection, ondansetron (ZOFRAN) IV, sodium chloride flush, sodium chloride flush, traZODone  Pertinent Labs/Diagnostics: CSF WBC 2 CSF protein 245 CSF glucose normal.  Culture with no growth CSF fungal stain pending Cytology/Flow PENDING MRI brain/orbits, MRA/MRV head PENDING repeat L-spine/sacral plexus MRI as well, though lower priority than brain imaging pending    Etta Quill PA-C [facility] [phone]  Impression: As prior: 67 yo F with Right third nerve palsy and left leg weakness in the setting of B-Cell lymphoma. Unclear if her leg weakness could be polyradicular vs plexopathy. Especially with the elevated protein, my concern for lymphomatous CNS involvement is significantly higher. Unfortunately her renal function precludes contrast.   Recommendations: 1) MRI/MRA/MRV of the brain 2) MRI of the sacral plexus/L-spine 3) follow-up cytology   Roland Rack, MD [facility] [phone]  If 7pm- 7am, please page neurology on call as listed in [place].  04/05/2017, 11:18 AM

## 2017-04-05 NOTE — Progress Notes (Signed)
Patient ID: Jo Collins, female   DOB: [birth year], 67 y.o.   MRN: [id number]  PROGRESS NOTE    Jo Collins  WIO:[id number] DOB: 08/23/1950 DOA: 03/14/2017 PCP: Deland Pretty, MD   Brief Narrative:  67 year old female with history of paroxysmal atrial fibrillation not on anticoagulation, large B-cell lymphoma with ongoing chemotherapy and recent tumor lysis syndrome resulting in acute renal failure anemia and hospitalization, morbid obesity, chronic back pain, prior biopsy of the right iliac crest in January and ongoing issues with low back pain with radiation down to the left leg with weakness and numbness was admitted on 03/11/2017 with complaints of leg pain, diplopia.  She was admitted for A. fib with RVR and possible sepsis from pneumonia.  She was started on antibiotics.  Cardiology was consulted.  He had a TEE and  cardioversion on 04/06/2017 but then converted back to A. fib.  Neurology was also consulted for neurological symptoms an MRI was ordered.  MRI could not be done at [facility] long hospital so she was transferred to [facility] on 04/03/2017.  She had LP done on 04/13/2017.  Oncology is also following.  Nephrology was consulted for worsening renal function and acidosis. PCCM was reconsulted for worsening hypoxia.  Assessment & Plan:   Active Problems:   NHL (non-Hodgkin's lymphoma) (HCC)   Morbid obesity with BMI of 50.0-59.9, adult (HCC)   Diffuse large B cell lymphoma (HCC)   Recurrent right pleural effusion   Pleural effusion   Atrial fibrillation with tachycardic ventricular rate (HCC)   Atrial flutter (HCC)   Sepsis (HCC)   Leukocytosis   Acute on chronic renal failure (HCC)   HCAP (healthcare-associated pneumonia)   3rd nerve palsy, complete, right   Hyperuricemia   Acute respiratory failure with hypoxia (HCC)   AKI (acute kidney injury) ([place])   Acute hypoxic respiratory failure  -Probably from combination of pneumonia/volume overload/A. fib with RVR -Requiring  intermittent BiPAP.  Off BiPAP this morning.  Continue oxygen supplementation -Continue antibiotics and diuresis.  On empiric steroids as well -Critical care following: Ultrasound of chest showed only small effusion not significant to tap  -Strict input and output.  Daily weights -Patient has a positive fluid balance of 17,396.3 mL since admission -High risk of intubation   Atrial flutter with RVR -Currently still tachycardic. -Cardiology following: Underwent TEE and DCCV on 04/04/2017 -Currently on Cardizem and amiodarone drips -Not on anticoagulation as an outpatient.  Currently on heparin drip  Sepsis from pneumonia -Cultures negative so far.  Continue antibiotics as below  Right-sided pneumonia with pleural effusion -Currently on cefepime. No evidence of MRSA pneumonia. vancomycin discontinued on 04/04/2017.  No significant pleural effusion as per PCCM  Leukocytosis -Worsening. Probably from pneumonia/A. fib/hypoxia.  Repeat a.m. labs  Acute renal failure with metabolic acidosis -Questionable prerenal versus tumor lysis syndrome.  Baseline creatinine less than 1 -Creatinine is 2.55 today -Nephrology following.  If renal function worsens, patient might need CVVHD and transfer to ICU.  Monitor strict input and output. -UA was negative for infection and renal ultrasound was negative for hydronephrosis -Currently on intravenous Lasix and bicarb drip  Right 3rd nerve palsy and left leg weakness in the setting of B-cell lymphoma -Neurology following.  Status post LP done on 04/03/2017 with cytology pending -MRI of the brain along with MRA/MRV of the head and MRI of the lumbosacral spine/sacral plexus as per neurology recommendations: This will be few hours study.  I do not think patient will be able to undergo  all these MRI with current condition.  I think it would be safe for her to be on a ventilator prior to getting these tests done.  Relapsed diffuse B-cell lymphoma -Oncology  following. -Overall prognosis is guarded -Palliative care consult pending.  For now patient remains full code  Anemia and thrombocytopenia -Probably from lymphoma and chemotherapy -Transfuse to keep hemoglobin above 7 - Morbid obesity -Outpatient follow-up  DVT prophylaxis: Heparin drip Code Status: Full Family Communication: Spoke to husband at bedside Disposition Plan: Depends on clinical outcome  Consultants: Neurology/cardiology/PCCM/oncology  Procedures: TEE/DCCV 04/29/2017 Mild biatrial dilation. Excellent left atrial appendage emptying velocities. No thrombus. LVEF>65%. Mild MR, mild TR. Normal aorta. No pericardial effusion.  Cardioverted1time(s).  Cardioversion with synchronized biphasic120Jshock.  Echo 04/01/17 Study Conclusions  - HPI and indications: Atrial flutter 427.32. - Procedure narrative: Transthoracic echocardiography. Image quality was poor. The study was technically difficult, as a result of body habitus. - Left ventricle: The cavity size was normal. Wall thickness was increased in a pattern of moderate LVH. Systolic function was vigorous. The estimated ejection fraction was 75%. The study is not technically sufficient to allow evaluation of LV diastolic function. - Mitral valve: Calcified annulus. Mildly thickened leaflets . At least mild stenosis. There was trivial regurgitation. Valve area by continuity equation (using LVOT flow): 1.68 cm^2. - Left atrium: Moderately dilated. - Atrial septum: There was increased thickness of the septum, consistent with lipomatous hypertrophy. - Tricuspid valve: There was mild regurgitation. - Pulmonary arteries: PA peak pressure: 32 mm Hg (S). - Inferior vena cava: The vessel was normal in size. The respirophasic diameter changes were in the normal range (>= 50%), consistent with normal central venous pressure.  Impressions:  - Technically difficult study. LVEF 75%, rapid atrial  flutter is noted, moderate LVH, normal wall motion, MAC with at least mild stenosis and trivial MR, moderae LAE, mild TR, RVSP 32 mmHg, normal IVC, no pericardial effusion.   Antimicrobials:  Cefepime from 03/24/2017 onwards Vancomycin from 03/25/2017-04/03/17  Subjective: Patient seen and examined at bedside.  She is extremely short of breath but feels slightly better.  She is off BiPAP this morning.  She feels very weak.  No overnight fever or vomiting. Objective: Vitals:   04/05/17 0400 04/05/17 0430 04/05/17 0500 04/05/17 0719  BP: 104/90 111/83 124/74 100/77  Pulse: (!) 125 (!) 121 (!) 125 (!) 136  Resp: 14 20 (!) 22 (!) 22  Temp:    97.8 F (36.6 C)  TempSrc:    Oral  SpO2: 100% 98% 100% 100%  Weight:      Height:        Intake/Output Summary (Last 24 hours) at 04/05/2017 1018 Last data filed at 04/05/2017 0915 Gross per 24 hour  Intake 892.25 ml  Output 1500 ml  Net -607.75 ml   Filed Weights   03/07/2017 1855 04/03/17 0358  Weight: 115.5 kg (254 lb 10.1 oz) 124.8 kg (275 lb 2.2 oz)    Examination:  General exam: Extremely ill looking female lying in bed and very short of breath.  Off BiPAP Respiratory system: Bilateral decreased breath sounds at bases with scattered crackles.  Improved wheezing.  Tachypneic  cardiovascular system: S1 & S2 heard; still very tachycardic gastrointestinal system: Abdomen is morbidly obese, nondistended, soft and nontender. Normal bowel sounds heard. Central nervous system: Awake; still having difficulty completing sentences.  No focal neurological deficits. Moving extremities Extremities: No cyanosis; 2-3+ pitting edema  skin: No rashes, lesions or ulcers Psychiatry: Could not be assessed  because of respiratory status.   Data Reviewed: I have personally reviewed following labs and imaging studies  CBC: Recent Labs  Lab 03/29/2017 1038 03/17/2017 1309  04/11/2017 0500 04/03/17 0500 04/04/17 0917 04/04/17 1620 04/05/17 0733  WBC  20.2* 18.5*   < > 33.0* 42.3* 41.8* 38.9* 47.3*  NEUTROABS 16.7* 14.0*  --  25.4*  --  29.7* 28.4*  --   HGB 9.5* 9.9*   < > 7.7* 7.8* 8.1* 7.9* 8.1*  HCT 28.9* 30.2*   < > 23.1* 23.1* 23.4* 22.5* 23.0*  MCV 85.0 85.8   < > 83.1 81.1 80.7 80.9 79.6  PLT 122* 102*   < > 124* 121* 93* 83* 96*   < > = values in this interval not displayed.   Basic Metabolic Panel: Recent Labs  Lab 03/31/17 1330 04/01/17 0430 04/29/2017 0500 04/03/17 0500 04/04/17 0917 04/04/17 1620 04/04/17 2140 04/05/17 0733  NA  --  141 140  --  140 138  138 140 141  K  --  4.4 4.3  --  3.8 4.0  4.0 4.1 3.6  CL  --  110 111  --  116* 114*  114* 113* 112*  CO2  --  20* 18*  --  13* 13*  13* 15* 16*  GLUCOSE  --  87 128*  --  94 136*  137* 151* 153*  BUN  --  28* 27*  --  30* 32*  33* 35* 38*  CREATININE  --  2.20* 2.04* 2.27* 2.41* 2.57*  2.57* 2.57* 2.55*  CALCIUM  --  8.6* 8.3*  --  8.0* 7.8*  7.8* 7.9* 7.6*  MG 2.2 2.0  --   --  1.7  --   --   --   PHOS  --   --   --   --   --  <1.0*  --  1.0*   GFR: Estimated Creatinine Clearance: 26.9 mL/min (A) (by C-G formula based on SCr of 2.55 mg/dL (H)). Liver Function Tests: Recent Labs  Lab 03/07/2017 1038 03/23/2017 1309 03/31/17 0513 04/04/17 0917 04/04/17 1620 04/05/17 0733  AST 45* 47* 51* 47*  --   --   ALT _0 --   --   ALKPHOS 121 105 96 101  --   --   BILITOT 1.0 1.1 0.8 0.8  --   --   PROT 5.8* 5.5* 4.8* 4.8*  --   --   ALBUMIN 2.9* 2.8* 2.4* 2.4* 2.4* 2.4*   No results for input(s): LIPASE, AMYLASE in the last 168 hours. No results for input(s): AMMONIA in the last 168 hours. Coagulation Profile: No results for input(s): INR, PROTIME in the last 168 hours. Cardiac Enzymes: No results for input(s): CKTOTAL, CKMB, CKMBINDEX, TROPONINI in the last 168 hours. BNP (last 3 results) No results for input(s): PROBNP in the last 8760 hours. HbA1C: No results for input(s): HGBA1C in the last 72 hours. CBG: No results for input(s): GLUCAP  in the last 168 hours. Lipid Profile: No results for input(s): CHOL, HDL, LDLCALC, TRIG, CHOLHDL, LDLDIRECT in the last 72 hours. Thyroid Function Tests: No results for input(s): TSH, T4TOTAL, FREET4, T3FREE, THYROIDAB in the last 72 hours. Anemia Panel: No results for input(s): VITAMINB12, FOLATE, FERRITIN, TIBC, IRON, RETICCTPCT in the last 72 hours. Sepsis Labs: Recent Labs  Lab 03/31/17 1433 04/01/17 0850 04/01/17 1541 04/04/17 2140  LATICACIDVEN 3.1* 2.4* 1.9 1.9    Recent Results (from the past 240 hour(s))  Blood Culture (routine x 2)     Status: None   Collection Time: 03/24/2017  1:40 PM  Result Value Ref Range Status   Specimen Description   Final    BLOOD LEFT ANTECUBITAL Performed at [facility] [address], [place], [place] [address]    Special Requests   Final    BOTTLES DRAWN AEROBIC AND ANAEROBIC Blood Culture results may not be optimal due to an inadequate volume of blood received in culture bottles Performed at [facility] [address], [place], [place] [address]    Culture   Final    NO GROWTH 5 DAYS Performed at [facility], [place] [address] [place], [place] [address]    Report Status 04/04/2017 FINAL  Final  MRSA PCR Screening     Status: None   Collection Time: 03/29/2017  6:56 PM  Result Value Ref Range Status   MRSA by PCR NEGATIVE NEGATIVE Final    Comment:        The GeneXpert MRSA Assay (FDA approved for NASAL specimens only), is one component of a comprehensive MRSA colonization surveillance program. It is not intended to diagnose MRSA infection nor to guide or monitor treatment for MRSA infections. Performed at [facility], [place] [address], [place], [place] [address]   Blood Culture (routine x 2)     Status: None   Collection Time: 03/09/2017  7:14 PM  Result Value Ref Range Status   Specimen Description   Final    BLOOD RIGHT HAND Performed at [facility] [address], [place], [place] [address]    Special Requests   Final    AEB BCLV Performed at [facility] [address], [place], [place] [address]    Culture   Final    NO GROWTH 5 DAYS Performed at [facility], [place] [address] [place], [place] [address]    Report Status 04/04/2017 FINAL  Final  CSF culture     Status: None (Preliminary result)   Collection Time: 04/03/17  1:54 PM  Result Value Ref Range Status   Specimen Description CSF  Final   Special Requests NONE  Final   Gram Stain   Final    NO WBC SEEN NO ORGANISMS SEEN CYTOSPIN SMEAR Gram Stain Report Called to,Read Back By and Verified With: E.MATHAI AT 1618 ON 04/03/17 BY N.THOMPSON Performed at [facility], [place] [address], [place], [place] [address]    Culture PENDING  Incomplete   Report Status PENDING  Incomplete         Radiology Studies: [place] Chest (pleural Effusion)  Result Date: 04/04/2017 CLINICAL DATA:  Follow-up right pleural effusion EXAM: CHEST ULTRASOUND COMPARISON:  None. FINDINGS: Small right pleural effusion is noted. No sizable component to allow for safe thoracentesis is noted. IMPRESSION: Small right-sided pleural effusion. Electronically Signed   By: Inez Catalina M.D.   On: 04/04/2017 15:10   Dg Chest Port 1 View  Result Date: 04/05/2017 CLINICAL DATA:  Respiratory failure short of breath EXAM: PORTABLE CHEST 1 VIEW COMPARISON:  04/04/2017 FINDINGS: Right jugular central venous catheter tip in the SVC unchanged. Extensive right lower lobe infiltrate unchanged with small right effusion. Mild left lower lobe atelectasis/infiltrate unchanged. IMPRESSION: Right lower lobe infiltrate unchanged, possible pneumonia. Small right effusion unchanged Left lower lobe atelectasis/infiltrate unchanged. Electronically Signed   By: Franchot Gallo M.D.   On: 04/05/2017 07:50   Dg Chest  Port 1 View  Result Date: 04/04/2017 CLINICAL DATA:  Shortness of  breath.  History of large cell lymphoma EXAM: PORTABLE CHEST 1 VIEW COMPARISON:  March 30, 2017 FINDINGS: Port-A-Cath tip is in the superior vena cava. No pneumothorax. There is a right pleural effusion with patchy airspace opacity throughout the right mid lower lung zones. The left lung is clear. Heart is upper normal in size with pulmonary vascularity within normal limits. No adenopathy evident. No bone lesions. IMPRESSION: Persistent pleural effusion on the right with areas of consolidation in portions of the right mid lower lung zones. Left lung remains clear. Stable cardiac silhouette. No new opacity evident. Electronically Signed   By: Lowella Grip III M.D.   On: 04/04/2017 09:20   Dg Fluoro Guide Lumbar Puncture  Result Date: 04/03/2017 CLINICAL DATA:  67 year old female with history of lymphoma and intrathecal chemotherapy presents with double vision, lower extremity weakness, atrial fibrillation with RVR. EXAM: DIAGNOSTIC LUMBAR PUNCTURE UNDER FLUOROSCOPIC GUIDANCE COMPARISON:  Lumbar MRI 02/08/2017. CT Abdomen and Pelvis 02/06/2017. FLUOROSCOPY TIME:  Fluoroscopy Time:  0 minutes 18 seconds Radiation Exposure Index (if provided by the fluoroscopic device): 2.8 mGy Number of Acquired Spot Images: 0 PROCEDURE: Informed consent was obtained from the patient prior to the procedure, including potential complications of headache, allergy, and pain. A "time-out" was performed. With the patient prone, the lower back was prepped with Betadine. 1% Lidocaine was used for local anesthesia. Lumbar puncture was performed at the L2-L3 level using a 6 in x 20 gauge needle with return of slightly yellow tinged CSF. The CSF color appeared constant throughout the 4 tubes. Due to immobility, CSF opening pressure with the patient decubitus was not performed, but subjectively the CSF pressure was normal, with slow CSF flow through the 6 in spinal needle. 17 mL of CSF were obtained for laboratory studies. The patient  tolerated the procedure well and there were no apparent complications. Appropriate post procedural orders were placed on the chart. The patient was returned to the inpatient floor in stable condition for continued treatment. IMPRESSION: Fluoroscopic guided lumbar puncture at L2-L3. 17 mL of yellow-tinged CSF were obtained for laboratory studies. Electronically Signed   By: Genevie Ann M.D.   On: 04/03/2017 14:05        Scheduled Meds: . allopurinol  100 mg Oral BID  . Chlorhexidine Gluconate Cloth  6 each Topical Daily  . feeding supplement (ENSURE ENLIVE)  237 mL Oral BID BM  . furosemide  80 mg Intravenous Q12H  . levalbuterol  0.63 mg Nebulization TID  . LORazepam  1 mg Intravenous Once  . methylPREDNISolone (SOLU-MEDROL) injection  60 mg Intravenous Q8H  . metoprolol tartrate  25 mg Oral TID  . sodium chloride flush  10-40 mL Intracatheter Q12H  . sodium chloride flush  3 mL Intravenous Q12H   Continuous Infusions: . amiodarone 30 mg/hr (04/04/17 1900)  . ceFEPime (MAXIPIME) IV Stopped (04/04/17 2202)  . diltiazem (CARDIZEM) infusion 10 mg/hr (04/04/17 1900)  . heparin 1,300 Units/hr (04/04/17 1900)  .  sodium bicarbonate (isotonic) infusion in sterile water 125 mL/hr at 04/05/17 0942     LOS: 6 days        Aline August, MD [facility] Pager [phone]  If 7PM-7AM, please contact night-coverage [email or web address] Password [email or web address] 04/05/2017, 10:18 AM

## 2017-04-05 NOTE — Progress Notes (Signed)
ANTICOAGULATION CONSULT NOTE - Follow Up Consult  Pharmacy Consult for heparin Indication: atrial fibrillation  No Known Allergies  Patient Measurements: Height: 5\' 1"  (154.9 cm) Weight: 275 lb 2.2 oz (124.8 kg) IBW/kg (Calculated) : 47.8 Heparin Dosing Weight: 76 kg  Vital Signs: Temp: 97.8 F (36.6 C) (04/04 0719) Temp Source: Oral (04/04 0719) BP: 100/77 (04/04 0719) Pulse Rate: 136 (04/04 0719)  Labs: Recent Labs    04/03/17 0500 04/04/17 0500 04/04/17 0917 04/04/17 1620 04/04/17 2140 04/05/17 0733  HGB 7.8*  --  8.1* 7.9*  --  8.1*  HCT 23.1*  --  23.4* 22.5*  --  23.0*  PLT 121*  --  93* 83*  --  96*  HEPARINUNFRC 0.53 0.27*  --   --   --  0.53  CREATININE 2.27*  --  2.41* 2.57*  2.57* 2.57* 2.55*    Estimated Creatinine Clearance: 26.9 mL/min (A) (by C-G formula based on SCr of 2.55 mg/dL (H)).   Assessment: Patient is a 67 y.o. F with hx lymphoma currently undergoing chemotherapy treatment presented to the ED on 03/22/2017 with afib with RVR s/p s/p DCCV on 4/1 but converted back to afib. She's currently on heparin drip. SOB has worsened and she was on bipap for a short period.  Palliative care consulted for goals of care -Heparin level is at goal on 1300 units/hr -plt= 96 ( lymphoma with recent chemo)   Goal of Therapy:  Heparin level 0.3-0.7 units/ml Monitor platelets by anticoagulation protocol: Yes   Plan -No heparin changes needed - daily heparin level and CBC -  Hildred Laser, PharmD Clinical Pharmacist Clinical phone from 8:30-4:00 is [phone] After 4pm, please call Main Rx [phone]) for assistance. 04/05/2017 11:04 AM

## 2017-04-05 NOTE — Progress Notes (Signed)
PULMONARY / CRITICAL CARE MEDICINE   Name: Jo Collins MRN: [id number] DOB: [birth year]    ADMISSION DATE:  03/03/2017 CONSULTATION DATE:  04/04/2017   REFERRING MD:  Starla Link  CHIEF COMPLAINT: shortness of breath   Interval Hx: Patient overall looks slightly better today. She is off the BIPAP and on 1 litre nasal canula. Remains on NaHCO3 drip at 64ml/hr and diuresing well. [place] did not show any significant pleural effusion. Still in aflutter/fib with RVR despite amio and dilt drips.    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 Patient respiratory  Status has been getting worse over the day and was put on BIPAP for increased work of breathing. Patient denies any cough, fever or chest pain.   Patient complains of seeing double and cant open her right eye. complain of pain in the left leg.   PAST MEDICAL HISTORY :  She  has a past medical history of Abdominal pain, RUQ, Abnormal EKG (08/07/2016), Acute midline thoracic back pain, Acute respiratory distress (09/25/2016), Anemia (08/07/2016), Chronic low back pain (09/25/2016), Cough, DLBCL (diffuse large B cell lymphoma) ([place]) (08/17/2016), GERD (gastroesophageal reflux disease), History of chemotherapy, Hypokalemia (09/25/2016), Intrathoracic mass (08/07/2016), Lymphadenopathy, Morbid obesity with BMI of 50.0-59.9, adult ([place]), Neutrophilic leukocytosis (45/08/996), NHL (non-Hodgkin's lymphoma) (HCC), Pleural effusion on right (08/07/2016), Recurrent pleural effusion on right (10/08/2016), Recurrent right pleural effusion (09/25/2016), S/P thoracentesis, Shortness of breath (08/07/2016), and SOB (shortness of breath) (09/26/2016).  PAST SURGICAL HISTORY: She  has a past surgical history that includes Abdominal hysterectomy; IR THORACENTESIS ASP PLEURAL SPACE  W/IMG GUIDE (08/07/2016); IR THORACENTESIS ASP PLEURAL SPACE W/IMG GUIDE (08/14/2016); IR FLUORO GUIDE PORT INSERTION RIGHT (08/18/2016); IR US Guide Vasc Access Right (08/18/2016); IR CV Line Injection (10/26/2016); IR US Guide Vasc Access Right (11/03/2016); IR FLUORO GUIDE PORT INSERTION RIGHT (11/03/2016); IR Removal Tun Cv Cath W/O FL (11/03/2016); and Cardioversion (N/A, 04/09/2017).  No Known Allergies  No current facility-administered medications on file prior to encounter.    Current Outpatient Medications on File Prior to Encounter  Medication Sig  . acetaminophen (TYLENOL) 500 MG tablet Take 1,000 mg by mouth every 6 (six) hours as needed for moderate pain.  [name] dexamethasone (DECADRON) 4 MG tablet 40mg  po daily with breakfast from D1 to D4 each cycle of chemotherapy  . gabapentin (NEURONTIN) 300 MG capsule 300mg  po with breakfast and lunch and 600mg  at bedtime.  [name] HYDROcodone-acetaminophen (NORCO) 5-325 MG tablet Take 1 tablet by mouth every 6 (six) hours as needed for severe pain.  . predniSONE (DELTASONE) 10 MG tablet 6 TABLETS BY MOUTH DAILY FOR 5 DAYS STARTING 03/26/17  . traMADol (ULTRAM) 50 MG tablet Take 50-100 mg by mouth every 6 (six) hours as needed for moderate pain or severe pain.  [name] aspirin EC 81 MG tablet Take 1 tablet (81 mg total) by mouth daily. (Patient not taking: Reported on 03/02/2017)  . furosemide (LASIX) 20 MG tablet Take 1 tablet (20 mg total) by mouth daily. (Patient not taking: Reported on 03/19/2017)  . predniSONE (DELTASONE) 20 MG tablet 3 tabs po daily x 5 days STARTING ON 03/26/17 (Patient not taking: Reported on 03/24/2017)    . allopurinol  100 mg Oral BID  . Chlorhexidine Gluconate Cloth  6 each Topical Daily  . feeding supplement (  ENSURE ENLIVE)  237 mL Oral BID BM  . furosemide  80 mg Intravenous Q12H  . levalbuterol  0.63 mg Nebulization TID  . LORazepam  1 mg Intravenous Once  . methylPREDNISolone (SOLU-MEDROL) injection  60 mg Intravenous Q8H  . metoprolol  tartrate  25 mg Oral TID  . sodium chloride flush  10-40 mL Intracatheter Q12H  . sodium chloride flush  3 mL Intravenous Q12H    Current Facility-Administered Medications:  .  acetaminophen (TYLENOL) tablet 650 mg, 650 mg, Oral, Q6H PRN, Hosie Poisson, MD, 650 mg at 04/01/17 0557 .  allopurinol (ZYLOPRIM) tablet 100 mg, 100 mg, Oral, BID, Brunetta Genera, MD, 100 mg at 04/05/17 0905 .  [COMPLETED] amiodarone (NEXTERONE) 1.8 mg/mL load via infusion 150 mg, 150 mg, Intravenous, Once, 150 mg at 04/04/17 0350 **FOLLOWED BY** [EXPIRED] amiodarone (NEXTERONE PREMIX) 360-4.14 MG/200ML-% (1.8 mg/mL) IV infusion, 60 mg/hr, Intravenous, Continuous, Stopped at 04/04/17 1005 **FOLLOWED BY** amiodarone (NEXTERONE PREMIX) 360-4.14 MG/200ML-% (1.8 mg/mL) IV infusion, 30 mg/hr, Intravenous, Continuous, Kirby-Graham, Karsten Fells, NP, Last Rate: 16.7 mL/hr at 04/04/17 1900, 30 mg/hr at 04/04/17 1900 .  ceFEPIme (MAXIPIME) 1 g in sodium chloride 0.9 % 100 mL IVPB, 1 g, Intravenous, Q24H, Kris Mouton, [facility], Stopped at 04/04/17 2202 .  Chlorhexidine Gluconate Cloth 2 % PADS 6 each, 6 each, Topical, Daily, Hosie Poisson, MD, 6 each at 04/05/17 0905 .  diltiazem (CARDIZEM) 100 mg in dextrose 5% 136mL (1 mg/mL) infusion, 10 mg/hr, Intravenous, Titrated, Kirby-Graham, Karsten Fells, NP, Last Rate: 10 mL/hr at 04/04/17 1900, 10 mg/hr at 04/04/17 1900 .  feeding supplement (ENSURE ENLIVE) (ENSURE ENLIVE) liquid 237 mL, 237 mL, Oral, BID BM, Karleen Hampshire, Vijaya, MD, 237 mL at 04/05/17 0905 .  furosemide (LASIX) injection 80 mg, 80 mg, Intravenous, Q12H, Keyah Blizard, Virgina Norfolk, MD, 80 mg at 04/04/17 1637 .  heparin ADULT infusion 100 units/mL (25000 units/268mL sodium chloride 0.45%), 1,300 Units/hr, Intravenous, Continuous, Kris Mouton, [facility], Last Rate: 13 mL/hr at 04/04/17 1900, 1,300 Units/hr at 04/04/17 1900 .  levalbuterol (XOPENEX) nebulizer solution 0.63 mg, 0.63 mg, Nebulization, Q8H PRN, Hosie Poisson, MD, 0.63 mg at 04/04/17  0514 .  levalbuterol (XOPENEX) nebulizer solution 0.63 mg, 0.63 mg, Nebulization, TID, Deland Pretty, MD, 0.63 mg at 04/05/17 0719 .  LORazepam (ATIVAN) injection 1 mg, 1 mg, Intravenous, Once, Hosie Poisson, MD .  methylPREDNISolone sodium succinate (SOLU-MEDROL) 125 mg/2 mL injection 60 mg, 60 mg, Intravenous, Q8H, Alekh, Kshitiz, MD, 60 mg at 04/05/17 0905 .  metoprolol tartrate (LOPRESSOR) tablet 25 mg, 25 mg, Oral, TID, Hosie Poisson, MD, 25 mg at 04/05/17 0905 .  morphine 4 MG/ML injection 1-2 mg, 1-2 mg, Intravenous, Q4H PRN, Hosie Poisson, MD, 2 mg at 04/03/17 1933 .  ondansetron (ZOFRAN) injection 4 mg, 4 mg, Intravenous, Q6H PRN, Hosie Poisson, MD, 4 mg at 03/28/2017 2254 .  sodium bicarbonate 150 mEq in sterile water 1,000 mL infusion, , Intravenous, Continuous, Aaleeyah Bias, Virgina Norfolk, MD, Last Rate: 75 mL/hr at 04/04/17 1900 .  sodium chloride flush (NS) 0.9 % injection 10-40 mL, 10-40 mL, Intracatheter, Q12H, Hosie Poisson, MD, 10 mL at 04/05/17 0905 .  sodium chloride flush (NS) 0.9 % injection 10-40 mL, 10-40 mL, Intracatheter, PRN, Hosie Poisson, MD .  sodium chloride flush (NS) 0.9 % injection 3 mL, 3 mL, Intravenous, Q12H, Hosie Poisson, MD, 3 mL at 04/04/17 2227 .  sodium chloride flush (NS) 0.9 % injection 3 mL, 3 mL, Intravenous, PRN, Hosie Poisson, MD .  traZODone (  DESYREL) tablet 50 mg, 50 mg, Oral, QHS PRN, Hosie Poisson, MD, 50 mg at 04/18/2017 2229    FAMILY HISTORY:  Her indicated that the status of her mother is unknown. She indicated that all of her four sisters are alive. She indicated that the status of her brother is unknown.   SOCIAL HISTORY: She  reports that she quit smoking about 8 months ago. Her smoking use included cigarettes. She has a 23.50 pack-year smoking history. She has never used smokeless tobacco. She reports that she does not drink alcohol or use drugs.  REVIEW OF SYSTEMS:   All 11 point system review were unremarkable other than what is mentioned in HPI     VITAL SIGNS: BP 100/77 (BP Location: Left Arm)   Pulse (!) 136   Temp 97.8 F (36.6 C) (Oral)   Resp (!) 22   Ht 5\' 1"  (1.549 m)   Wt 124.8 kg (275 lb 2.2 oz)   SpO2 100%   BMI 51.99 kg/m   HEMODYNAMICS:    VENTILATOR SETTINGS: FiO2 (%):  [40 %] 40 %  On BIPAP   INTAKE / OUTPUT: I/O last 3 completed shifts: In: 1372.3 [P.O.:480; I.V.:892.3] Out: 1000 [Urine:1000]  PHYSICAL EXAMINATION: General:  Looks acutely ill in mild distress on [place]  Neuro: alert oriented x3 right ptosis otherwise intact  HEENT: moist mucus membranes  Cardiovascular:  Normal heart sounds no murmrus  Lungs:  Coarse crackles bilaterally with decreased air sounds on the right  Abdomen:  Soft no tenderness  Musculoskeletal: ++ edema  Skin:  No rash   LABS:  BMET Recent Labs  Lab 04/04/17 1620 04/04/17 2140 04/05/17 0733  NA 138  138 140 141  K 4.0  4.0 4.1 3.6  CL 114*  114* 113* 112*  CO2 13*  13* 15* 16*  BUN 32*  33* 35* 38*  CREATININE 2.57*  2.57* 2.57* 2.55*  GLUCOSE 136*  137* 151* 153*    Electrolytes Recent Labs  Lab 03/31/17 1330 04/01/17 0430  04/04/17 0917 04/04/17 1620 04/04/17 2140 04/05/17 0733  CALCIUM  --  8.6*   < > 8.0* 7.8*  7.8* 7.9* 7.6*  MG 2.2 2.0  --  1.7  --   --   --   PHOS  --   --   --   --  <1.0*  --  1.0*   < > = values in this interval not displayed.    CBC Recent Labs  Lab 04/04/17 0917 04/04/17 1620 04/05/17 0733  WBC 41.8* 38.9* 47.3*  HGB 8.1* 7.9* 8.1*  HCT 23.4* 22.5* 23.0*  PLT 93* 83* 96*    Coag's No results for input(s): APTT, INR in the last 168 hours.  Sepsis Markers Recent Labs  Lab 04/01/17 0850 04/01/17 1541 04/04/17 2140  LATICACIDVEN 2.4* 1.9 1.9    ABG Recent Labs  Lab 04/04/17 0841  PHART 7.333*  PCO2ART 26.0*  PO2ART 97.4    Liver Enzymes Recent Labs  Lab 03/03/2017 1309 03/31/17 0513 04/04/17 0917 04/04/17 1620 04/05/17 0733  AST 47* 51* 47*  --   --   ALT 25 25 23   --   --    ALKPHOS 105 96 101  --   --   BILITOT 1.1 0.8 0.8  --   --   ALBUMIN 2.8* 2.4* 2.4* 2.4* 2.4*    Cardiac Enzymes No results for input(s): TROPONINI, PROBNP in the last 168 hours.  Glucose No results for input(s): GLUCAP in the  last 168 hours.  Imaging [place] Chest (pleural Effusion)  Result Date: 04/04/2017 CLINICAL DATA:  Follow-up right pleural effusion EXAM: CHEST ULTRASOUND COMPARISON:  None. FINDINGS: Small right pleural effusion is noted. No sizable component to allow for safe thoracentesis is noted. IMPRESSION: Small right-sided pleural effusion. Electronically Signed   By: Inez Catalina M.D.   On: 04/04/2017 15:10   Dg Chest Port 1 View  Result Date: 04/05/2017 CLINICAL DATA:  Respiratory failure short of breath EXAM: PORTABLE CHEST 1 VIEW COMPARISON:  04/04/2017 FINDINGS: Right jugular central venous catheter tip in the SVC unchanged. Extensive right lower lobe infiltrate unchanged with small right effusion. Mild left lower lobe atelectasis/infiltrate unchanged. IMPRESSION: Right lower lobe infiltrate unchanged, possible pneumonia. Small right effusion unchanged Left lower lobe atelectasis/infiltrate unchanged. Electronically Signed   By: Franchot Gallo M.D.   On: 04/05/2017 07:50      ANTIBIOTICS: Cefepime    SIGNIFICANT EVENTS: Worsening resp failure     DISCUSSION: Jo Collins [place] a 67 yr old lady with recurrent large B cell lymphoma coming in with acute renal failure severe metabolic acidosis, acute hypoxemic respiratory failure requiring non invasive mechanical ventilation, pulmonary edema and right pleural effusion with right chest mass. Patient is also in atrial flutter with RVR. 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   ASSESSMENT / PLAN:  PULMONARY A: Acute hypoxemic resp failure requiring on/off non invasive mechanical ventilation for increased work of breathing Pulmonary edema Right pleural effusion  Right chest wall mass recurrent lymphoma  P:   -  on nasal canula. 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   P:   - Appreciate nephrology input - increase NaHCO3 drip to 153ml/hr - diurese with lasix, patient has significant peripheral edema  - might need CVVHD if no improvement   GASTROINTESTINAL A:    P:   Npo for possible intubation   HEMATOLOGIC A:   Recurrent large B cell lymphoma to right chest wall and pelvic lymph nodes   P:  Appreciate heme/onc input  ?benefit from palliative care   INFECTIOUS A:   Health care associated pneumonia  P:   Cefepime   ENDOCRINE A:   No issues  P:     NEUROLOGIC A:   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

## 2017-04-05 NOTE — Progress Notes (Addendum)
Progress Note  Patient Name: Jo Collins Date of Encounter: 04/05/2017  Primary Cardiologist: No primary care provider on file. New ([place])  Subjective   A little better. Off BiPAP. Remains in typical atrial flutter with 2:1 AV block despite oral metoprolol,  IV diltiazem and IV amiodarone. Renal function continues to wax and wane, generally a pattern of slow deterioration.  Inpatient Medications    Scheduled Meds: . allopurinol  100 mg Oral BID  . Chlorhexidine Gluconate Cloth  6 each Topical Daily  . feeding supplement (ENSURE ENLIVE)  237 mL Oral BID BM  . furosemide  80 mg Intravenous Q12H  . levalbuterol  0.63 mg Nebulization TID  . LORazepam  1 mg Intravenous Once  . methylPREDNISolone (SOLU-MEDROL) injection  60 mg Intravenous Q8H  . metoprolol tartrate  25 mg Oral TID  . sodium chloride flush  10-40 mL Intracatheter Q12H  . sodium chloride flush  3 mL Intravenous Q12H   Continuous Infusions: . amiodarone 30 mg/hr (04/04/17 1900)  . ceFEPime (MAXIPIME) IV Stopped (04/04/17 2202)  . diltiazem (CARDIZEM) infusion 10 mg/hr (04/04/17 1900)  . heparin 1,300 Units/hr (04/04/17 1900)  .  sodium bicarbonate (isotonic) infusion in sterile water 75 mL/hr at 04/04/17 1900   PRN Meds: acetaminophen, levalbuterol, morphine injection, ondansetron (ZOFRAN) IV, sodium chloride flush, sodium chloride flush, traZODone   Vital Signs    Vitals:   04/05/17 0400 04/05/17 0430 04/05/17 0500 04/05/17 0719  BP: 104/90 111/83 124/74 100/77  Pulse: (!) 125 (!) 121 (!) 125 (!) 136  Resp: 14 20 (!) 22 (!) 22  Temp:    97.8 F (36.6 C)  TempSrc:    Oral  SpO2: 100% 98% 100% 100%  Weight:      Height:        Intake/Output Summary (Last 24 hours) at 04/05/2017 0926 Last data filed at 04/05/2017 0915 Gross per 24 hour  Intake 892.25 ml  Output 1500 ml  Net -607.75 ml   Filed Weights   04/01/2017 1855 04/03/17 0358  Weight: 254 lb 10.1 oz (115.5 kg) 275 lb 2.2 oz (124.8 kg)     Telemetry    Atrial flutter 2:1 AV block - Personally Reviewed  ECG    No new tracing - Personally Reviewed  Physical Exam  Morbidly obese, mildly tachypneic GEN: No acute distress.   Neck: unable to see JVD Cardiac: tachy, RRR, no murmurs, rubs, or gallops.  Respiratory: Clear to auscultation bilaterally. GI: Soft, nontender, non-distended  MS: No edema; No deformity. Neuro:  Nonfocal  Psych: Normal affect   Labs    Chemistry Recent Labs  Lab 03/29/2017 1309 03/31/17 0513  04/04/17 0917 04/04/17 1620 04/04/17 2140 04/05/17 0733  NA 141 141   < > 140 138  138 140 141  K 3.6 3.1*   < > 3.8 4.0  4.0 4.1 3.6  CL 103 107   < > 116* 114*  114* 113* 112*  CO2 21* 22   < > 13* 13*  13* 15* 16*  GLUCOSE 89 87   < > 94 136*  137* 151* 153*  BUN 38* 30*   < > 30* 32*  33* 35* 38*  CREATININE 2.39* 2.22*   < > 2.41* 2.57*  2.57* 2.57* 2.55*  CALCIUM 8.9 8.3*   < > 8.0* 7.8*  7.8* 7.9* 7.6*  PROT 5.5* 4.8*  --  4.8*  --   --   --   ALBUMIN 2.8* 2.4*  --  2.4*  2.4*  --  2.4*  AST 47* 51*  --  47*  --   --   --   ALT 25 25  --  23  --   --   --   ALKPHOS 105 96  --  101  --   --   --   BILITOT 1.1 0.8  --  0.8  --   --   --   GFRNONAA 20* 22*   < > 20* 18*  18* 18* 19*  GFRAA 23* 25*   < > 23* 21*  21* 21* 21*  ANIONGAP 17* 12   < > 11 11  11 12 13    < > = values in this interval not displayed.     Hematology Recent Labs  Lab 04/04/17 0917 04/04/17 1620 04/05/17 0733  WBC 41.8* 38.9* 47.3*  RBC 2.90* 2.78* 2.89*  HGB 8.1* 7.9* 8.1*  HCT 23.4* 22.5* 23.0*  MCV 80.7 80.9 79.6  MCH 27.9 28.4 28.0  MCHC 34.6 35.1 35.2  RDW 21.1* 20.9* 21.2*  PLT 93* 83* 96*     Radiology    [place] Chest (pleural Effusion)  Result Date: 04/04/2017 CLINICAL DATA:  Follow-up right pleural effusion EXAM: CHEST ULTRASOUND COMPARISON:  None. FINDINGS: Small right pleural effusion is noted. No sizable component to allow for safe thoracentesis is noted. IMPRESSION: Small  right-sided pleural effusion. Electronically Signed   By: Inez Catalina M.D.   On: 04/04/2017 15:10   Dg Chest Port 1 View  Result Date: 04/05/2017 CLINICAL DATA:  Respiratory failure short of breath EXAM: PORTABLE CHEST 1 VIEW COMPARISON:  04/04/2017 FINDINGS: Right jugular central venous catheter tip in the SVC unchanged. Extensive right lower lobe infiltrate unchanged with small right effusion. Mild left lower lobe atelectasis/infiltrate unchanged. IMPRESSION: Right lower lobe infiltrate unchanged, possible pneumonia. Small right effusion unchanged Left lower lobe atelectasis/infiltrate unchanged. Electronically Signed   By: Franchot Gallo M.D.   On: 04/05/2017 07:50   Dg Chest Port 1 View  Result Date: 04/04/2017 CLINICAL DATA:  Shortness of breath.  History of large cell lymphoma EXAM: PORTABLE CHEST 1 VIEW COMPARISON:  March 30, 2017 FINDINGS: Port-A-Cath tip is in the superior vena cava. No pneumothorax. There is a right pleural effusion with patchy airspace opacity throughout the right mid lower lung zones. The left lung is clear. Heart is upper normal in size with pulmonary vascularity within normal limits. No adenopathy evident. No bone lesions. IMPRESSION: Persistent pleural effusion on the right with areas of consolidation in portions of the right mid lower lung zones. Left lung remains clear. Stable cardiac silhouette. No new opacity evident. Electronically Signed   By: Lowella Grip III M.D.   On: 04/04/2017 09:20   Dg Fluoro Guide Lumbar Puncture  Result Date: 04/03/2017 CLINICAL DATA:  67 year old female with history of lymphoma and intrathecal chemotherapy presents with double vision, lower extremity weakness, atrial fibrillation with RVR. EXAM: DIAGNOSTIC LUMBAR PUNCTURE UNDER FLUOROSCOPIC GUIDANCE COMPARISON:  Lumbar MRI 02/08/2017. CT Abdomen and Pelvis 02/06/2017. FLUOROSCOPY TIME:  Fluoroscopy Time:  0 minutes 18 seconds Radiation Exposure Index (if provided by the fluoroscopic  device): 2.8 mGy Number of Acquired Spot Images: 0 PROCEDURE: Informed consent was obtained from the patient prior to the procedure, including potential complications of headache, allergy, and pain. A "time-out" was performed. With the patient prone, the lower back was prepped with Betadine. 1% Lidocaine was used for local anesthesia. Lumbar puncture was performed at the L2-L3 level using a 6 in  x 20 gauge needle with return of slightly yellow tinged CSF. The CSF color appeared constant throughout the 4 tubes. Due to immobility, CSF opening pressure with the patient decubitus was not performed, but subjectively the CSF pressure was normal, with slow CSF flow through the 6 in spinal needle. 17 mL of CSF were obtained for laboratory studies. The patient tolerated the procedure well and there were no apparent complications. Appropriate post procedural orders were placed on the chart. The patient was returned to the inpatient floor in stable condition for continued treatment. IMPRESSION: Fluoroscopic guided lumbar puncture at L2-L3. 17 mL of yellow-tinged CSF were obtained for laboratory studies. Electronically Signed   By: Genevie Ann M.D.   On: 04/03/2017 14:05    Cardiac Studies   Echo 04/01/17 Study Conclusions  - HPI and indications: Atrial flutter 427.32. - Procedure narrative: Transthoracic echocardiography. Image quality was poor. The study was technically difficult, as a result of body habitus. - Left ventricle: The cavity size was normal. Wall thickness was increased in a pattern of moderate LVH. Systolic function was vigorous. The estimated ejection fraction was 75%. The study is not technically sufficient to allow evaluation of LV diastolic function. - Mitral valve: Calcified annulus. Mildly thickened leaflets . At least mild stenosis. There was trivial regurgitation. Valve area by continuity equation (using LVOT flow): 1.68 cm^2. - Left atrium: Moderately dilated. - Atrial  septum: There was increased thickness of the septum, consistent with lipomatous hypertrophy. - Tricuspid valve: There was mild regurgitation. - Pulmonary arteries: PA peak pressure: 32 mm Hg (S). - Inferior vena cava: The vessel was normal in size. The respirophasic diameter changes were in the normal range (>= 50%), consistent with normal central venous pressure.  Impressions:  - Technically difficult study. LVEF 75%, rapid atrial flutter is noted, moderate LVH, normal wall motion, MAC with at least mild stenosis and trivial MR, moderae LAE, mild TR, RVSP 32 mmHg, normal IVC, no pericardial effusion.  TEE/DCCV 04/17/2017 FINDINGS: Mild biatrial dilation. Excellent left atrial appendage emptying velocities. No thrombus. LVEF>65%. Mild MR, mild TR. Normal aorta. No pericardial effusion.  Cardioverted1time(s).  Cardioversion with synchronized biphasic120Jshock.  Patient Profile     67 y.o. female with history of diffuse large B-cell lymphoma, sciatica, GERD, morbid obesity who presented with acute renal failure, pancytopenia and concern for pneumonia and sepsis. Underwent cardioversion for atrial flutter with rapid ventricular response on April 1, with return to atrial fibrillation roughly 16 hours later, now back in atrial flutter with 2: 1 AV block.  Assessment & Plan    1. AFlutter:   Ablation would be of limited benefit since she has demonstrated that she has both atrial flutter and atrial fibrillation, but atrial fibrillation would be easier to rate control. Continue anticoagulation, which will allow easier cardioversion in the future, if uninterrupted.  Unfortunately, another DCCV would also likely have very short term success.  Continue amiodarone for rate control.  Additional rate control medications limited by low blood pressure.  Would like to avoid digoxin due to volatile and worsening kidney function and amiodarone drug interaction.  Will ask EP for any  additional treatment options. 2.  Acute respiratory failure with hypoxia: On BiPAP intermittently.  Unilateral lung opacity and pleural effusion on the chest x-ray is more consistent with an infectious or inflammatory process, rather than heart failure.  Has normal left ventricular systolic function. 3.  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  Morbid obesity   For questions or updates, please contact  [facility] Please consult [email or web address] for contact info under Cardiology/STEMI.      Signed, Sanda Klein, MD  04/05/2017, 9:27 AM

## 2017-04-05 NOTE — Progress Notes (Signed)
Patient transported to MRI and placed on MRI table. Patient in supine position on room air. Patient stable at this time. Continuous pulse oximetry and cardiac monitoring of patient during procedure.

## 2017-04-05 NOTE — Progress Notes (Signed)
   04/05/17 1800  Clinical Encounter Type  Visited With Patient and family together  Visit Type Initial  Referral From Nurse  Consult/Referral To Chaplain  Spiritual Encounters  Spiritual Needs Prayer;Emotional  Stress Factors  Patient Stress Factors Exhausted  Family Stress Factors Exhausted   Jo Collins was laying on her bed awake and seemingly very weak. Jo Collins's husband was on-site. Both Jo Collins and husband were very receptive and appreciative of chaplain's visit. Chaplain provided emotional support through compassionate presence and prayer.  Sharene Krikorian a Medical sales representative, [employer]

## 2017-04-05 NOTE — Progress Notes (Signed)
S: Feels better today  O:BP 100/77 (BP Location: Left Arm)   Pulse (!) 136   Temp 97.8 F (36.6 C) (Oral)   Resp (!) 22   Ht 5\' 1"  (1.549 m)   Wt 124.8 kg (275 lb 2.2 oz)   SpO2 100%   BMI 51.99 kg/m   Intake/Output Summary (Last 24 hours) at 04/05/2017 1042 Last data filed at 04/05/2017 0915 Gross per 24 hour  Intake 892.25 ml  Output 1500 ml  Net -607.75 ml   Intake/Output: I/O last 3 completed shifts: In: 1372.3 [P.O.:480; I.V.:892.3] Out: 1000 [Urine:1000]  Intake/Output this shift:  Total I/O In: 0  Out: 700 [Urine:700] Weight change:  GBT:DVVOHYW ill-appearing and is tachypnic CVS: tachy at 136 Resp:occ rhonchi and exp wheezes Abd:+BS, soft Ext:1+ edema lower extremities  Recent Labs  Lab 03/04/2017 1038  03/20/2017 1309 03/31/17 0513 04/01/17 0430 04/17/2017 0500 04/03/17 0500 04/04/17 0917 04/04/17 1620 04/04/17 2140 04/05/17 0733  NA 139  --  141 141 141 140  --  140 138  138 140 141  K 3.4*  --  3.6 3.1* 4.4 4.3  --  3.8 4.0  4.0 4.1 3.6  CL 100  --  103 107 110 111  --  116* 114*  114* 113* 112*  CO2 24  --  21* 22 20* 18*  --  13* 13*  13* 15* 16*  GLUCOSE 98  --  89 87 87 128*  --  94 136*  137* 151* 153*  BUN 38*  --  38* 30* 28* 27*  --  30* 32*  33* 35* 38*  CREATININE 2.48*   < > 2.39* 2.22* 2.20* 2.04* 2.27* 2.41* 2.57*  2.57* 2.57* 2.55*  ALBUMIN 2.9*  --  2.8* 2.4*  --   --   --  2.4* 2.4*  --  2.4*  CALCIUM 9.2  --  8.9 8.3* 8.6* 8.3*  --  8.0* 7.8*  7.8* 7.9* 7.6*  PHOS  --   --   --   --   --   --   --   --  <1.0*  --  1.0*  AST 45*  --  47* 51*  --   --   --  47*  --   --   --   ALT 23  --  25 25  --   --   --  23  --   --   --    < > = values in this interval not displayed.   Liver Function Tests: Recent Labs  Lab 03/17/2017 1309 03/31/17 0513 04/04/17 0917 04/04/17 1620 04/05/17 0733  AST 47* 51* 47*  --   --   ALT 25 25 23   --   --   ALKPHOS 105 96 101  --   --   BILITOT 1.1 0.8 0.8  --   --   PROT 5.5* 4.8* 4.8*  --   --    ALBUMIN 2.8* 2.4* 2.4* 2.4* 2.4*   No results for input(s): LIPASE, AMYLASE in the last 168 hours. No results for input(s): AMMONIA in the last 168 hours. CBC: Recent Labs  Lab 04/22/2017 0500 04/03/17 0500 04/04/17 0917 04/04/17 1620 04/05/17 0733  WBC 33.0* 42.3* 41.8* 38.9* 47.3*  NEUTROABS 25.4*  --  29.7* 28.4*  --   HGB 7.7* 7.8* 8.1* 7.9* 8.1*  HCT 23.1* 23.1* 23.4* 22.5* 23.0*  MCV 83.1 81.1 80.7 80.9 79.6  PLT 124* 121* 93* 83* 96*  Cardiac Enzymes: No results for input(s): CKTOTAL, CKMB, CKMBINDEX, TROPONINI in the last 168 hours. CBG: No results for input(s): GLUCAP in the last 168 hours.  Iron Studies: No results for input(s): IRON, TIBC, TRANSFERRIN, FERRITIN in the last 72 hours. Studies/Results: [place] Chest (pleural Effusion)  Result Date: 04/04/2017 CLINICAL DATA:  Follow-up right pleural effusion EXAM: CHEST ULTRASOUND COMPARISON:  None. FINDINGS: Small right pleural effusion is noted. No sizable component to allow for safe thoracentesis is noted. IMPRESSION: Small right-sided pleural effusion. Electronically Signed   By: Inez Catalina M.D.   On: 04/04/2017 15:10   Dg Chest Port 1 View  Result Date: 04/05/2017 CLINICAL DATA:  Respiratory failure short of breath EXAM: PORTABLE CHEST 1 VIEW COMPARISON:  04/04/2017 FINDINGS: Right jugular central venous catheter tip in the SVC unchanged. Extensive right lower lobe infiltrate unchanged with small right effusion. Mild left lower lobe atelectasis/infiltrate unchanged. IMPRESSION: Right lower lobe infiltrate unchanged, possible pneumonia. Small right effusion unchanged Left lower lobe atelectasis/infiltrate unchanged. Electronically Signed   By: Franchot Gallo M.D.   On: 04/05/2017 07:50   Dg Chest Port 1 View  Result Date: 04/04/2017 CLINICAL DATA:  Shortness of breath.  History of large cell lymphoma EXAM: PORTABLE CHEST 1 VIEW COMPARISON:  March 30, 2017 FINDINGS: Port-A-Cath tip is in the superior vena cava. No  pneumothorax. There is a right pleural effusion with patchy airspace opacity throughout the right mid lower lung zones. The left lung is clear. Heart is upper normal in size with pulmonary vascularity within normal limits. No adenopathy evident. No bone lesions. IMPRESSION: Persistent pleural effusion on the right with areas of consolidation in portions of the right mid lower lung zones. Left lung remains clear. Stable cardiac silhouette. No new opacity evident. Electronically Signed   By: Lowella Grip III M.D.   On: 04/04/2017 09:20   Dg Fluoro Guide Lumbar Puncture  Result Date: 04/03/2017 CLINICAL DATA:  67 year old female with history of lymphoma and intrathecal chemotherapy presents with double vision, lower extremity weakness, atrial fibrillation with RVR. EXAM: DIAGNOSTIC LUMBAR PUNCTURE UNDER FLUOROSCOPIC GUIDANCE COMPARISON:  Lumbar MRI 02/08/2017. CT Abdomen and Pelvis 02/06/2017. FLUOROSCOPY TIME:  Fluoroscopy Time:  0 minutes 18 seconds Radiation Exposure Index (if provided by the fluoroscopic device): 2.8 mGy Number of Acquired Spot Images: 0 PROCEDURE: Informed consent was obtained from the patient prior to the procedure, including potential complications of headache, allergy, and pain. A "time-out" was performed. With the patient prone, the lower back was prepped with Betadine. 1% Lidocaine was used for local anesthesia. Lumbar puncture was performed at the L2-L3 level using a 6 in x 20 gauge needle with return of slightly yellow tinged CSF. The CSF color appeared constant throughout the 4 tubes. Due to immobility, CSF opening pressure with the patient decubitus was not performed, but subjectively the CSF pressure was normal, with slow CSF flow through the 6 in spinal needle. 17 mL of CSF were obtained for laboratory studies. The patient tolerated the procedure well and there were no apparent complications. Appropriate post procedural orders were placed on the chart. The patient was returned  to the inpatient floor in stable condition for continued treatment. IMPRESSION: Fluoroscopic guided lumbar puncture at L2-L3. 17 mL of yellow-tinged CSF were obtained for laboratory studies. Electronically Signed   By: Genevie Ann M.D.   On: 04/03/2017 14:05   . allopurinol  100 mg Oral BID  . Chlorhexidine Gluconate Cloth  6 each Topical Daily  . feeding supplement (ENSURE ENLIVE)  237 mL Oral BID BM  . furosemide  80 mg Intravenous Q12H  . levalbuterol  0.63 mg Nebulization TID  . LORazepam  1 mg Intravenous Once  . methylPREDNISolone (SOLU-MEDROL) injection  60 mg Intravenous Q8H  . metoprolol tartrate  25 mg Oral TID  . sodium chloride flush  10-40 mL Intracatheter Q12H  . sodium chloride flush  3 mL Intravenous Q12H    BMET    Component Value Date/Time   NA 141 04/05/2017 0733   NA 143 12/07/2016 0823   K 3.6 04/05/2017 0733   K 3.5 12/07/2016 0823   CL 112 (H) 04/05/2017 0733   CO2 16 (L) 04/05/2017 0733   CO2 24 12/07/2016 0823   GLUCOSE 153 (H) 04/05/2017 0733   GLUCOSE 94 12/07/2016 0823   BUN 38 (H) 04/05/2017 0733   BUN 9.3 12/07/2016 0823   CREATININE 2.55 (H) 04/05/2017 0733   CREATININE 2.48 (H) 03/26/2017 1038   CREATININE 0.7 12/07/2016 0823   CALCIUM 7.6 (L) 04/05/2017 0733   CALCIUM 8.9 12/07/2016 0823   GFRNONAA 19 (L) 04/05/2017 0733   GFRNONAA 19 (L) 03/25/2017 1038   GFRAA 21 (L) 04/05/2017 0733   GFRAA 22 (L) 03/17/2017 1038   CBC    Component Value Date/Time   WBC 47.3 (H) 04/05/2017 0733   RBC 2.89 (L) 04/05/2017 0733   HGB 8.1 (L) 04/05/2017 0733   HGB 8.8 (L) 12/07/2016 0823   HCT 23.0 (L) 04/05/2017 0733   HCT 27.9 (L) 12/07/2016 0823   PLT 96 (L) 04/05/2017 0733   PLT 16 (L) 03/16/2017 0753   PLT 213 12/07/2016 0823   MCV 79.6 04/05/2017 0733   MCV 90.9 12/07/2016 0823   MCH 28.0 04/05/2017 0733   MCHC 35.2 04/05/2017 0733   RDW 21.2 (H) 04/05/2017 0733   RDW 15.6 (H) 12/07/2016 0823   LYMPHSABS 2.7 04/04/2017 1620   LYMPHSABS 0.4 (L)  12/07/2016 0823   MONOABS 1.2 (H) 04/04/2017 1620   MONOABS 0.6 12/07/2016 0823   EOSABS 0.0 04/04/2017 1620   EOSABS 0.0 12/07/2016 0823   BASOSABS 0.0 04/04/2017 1620   BASOSABS 0.0 12/07/2016 0823     Assessment/Plan: 1.  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  Doubt tumor lysis syndrome given hypophosphatemia.  More consistent with ischemic ATN in setting of volume depletion, tachycardia, relative hypotensive and acute illness.  Urine output is picking up and creatinine stabilizing. 1. Discussed possible renal replacement with the patient and her family 04/04/17.  Overall prognosis is poor and recommend palliative care consult to help set goals/limits of care.  She may require intubation and cvvhd if she continues to deteriorate but overall prognosis remains unchanged 2. Respiratory distress with acute hypoxemic respiratory failure- currently off BiPap but still tachypnic.   3. Right mid and lower lung consolidation consistent with HCAP.  abx per primary team 4. Metabolic acidosis due to #1- agree with isotonic bicarb drip but with pulmonary edema and peripheral edema.  Ok to give lasix and bicarb and follow 5. Refractory DLBCL despite multiple chemo regimens.  Prognosis is grim 6. Hypophosphatemia- likely due to poor po intake doubt Fanconi syndrome due to lack of glucosuria.  Will replete and follow.  7. 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 Thrombocytopenia- due to malignancy 9. A flutter with RVR failed DCCV and now on amio and cardizem drip with evidence of pulmonary edema and diastolic heart failure.  Cardiology following 10. 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 11. Disposition- overall prognosis is poor.  Recommend palliative care consult to help set goal/limits of care as she is currently a full code and probability of survival is low.  Consider transitioning to  comfort care.    Jo Potts, MD [employer] [phone]

## 2017-04-05 NOTE — Consult Note (Addendum)
Cardiology Consultation:   Patient ID: Jo Collins; [id number]; Jun 19, 1950   Admit date: 03/29/2017 Date of Consult: 04/05/2017  Primary Care Provider: Deland Pretty, MD Primary Cardiologist: Dr. Irish Lack   Patient Profile:   Jo Collins is a 67 y.o. female with a hx of PAFib, diffuse B-cell Lymphoma, GERD, sciatica pain, morbid obesity, chronic R pleural effusion who is being seen today for the evaluation of AFlutter at the request of Dr. Sallyanne Kuster.  History of Present Illness:   Jo Collins was admitted with ARF, pancytopenia, dehydration, febrile, suspected sepsis.  Cardiology was called to her case for rapid AFlutter.    Afib initially diagnosed Sept 2018 in the environment of undergoing chemo tx, and not anticoagulated at that time, Jan 2019 she was referred to cardiology, Dr, Irish Lack, the patient was in [place], and at least while she was on chemo, the patient declined a/c, with HCA2Ds2Vasc of 2.  Since here she has got Amio bolus and gtt, heparin gtt, BP has been a limiting factor for medicines amio was switched to PO and underwent TEE/DCCV 04/03/2017 with ERAF by the next day w/RVR and diltiazem gtt added, eventually back in amio gtt and oral BB.  Despite this rates reamin 130's-140.  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 Abdominal pain, RUQ   . Abnormal EKG 08/07/2016  . Acute midline thoracic back pain   . Acute respiratory distress 09/25/2016  . Anemia 08/07/2016  . Chronic low  back pain 09/25/2016  . Cough   . DLBCL (diffuse large B cell lymphoma) ([facility]/16/2018  . GERD (gastroesophageal reflux disease)   . History of chemotherapy   . Hypokalemia 09/25/2016  . 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 Lymphadenopathy   . Morbid obesity with BMI of 50.0-59.9, adult ([facility]  . Neutrophilic leukocytosis 1023/05/5730. NHL (non-Hodgkin's lymphoma) ([facility]  . Pleural effusion on right 08/07/2016  . Recurrent pleural effusion on right 10/08/2016  . Recurrent right pleural effusion 09/25/2016  . S/P thoracentesis   . Shortness of breath 08/07/2016  . SOB (shortness of breath) 09/26/2016    Past Surgical History:  Procedure Laterality Date  . ABDOMINAL HYSTERECTOMY    . CARDIOVERSION N/A 04/24/2017   Procedure: CARDIOVERSION;  Surgeon: CrSanda KleinMD;  Location: [facility];  Service: Cardiovascular;  Laterality: N/A;  . IR CV LINE INJECTION  10/26/2016  . IR FLUORO GUIDE PORT INSERTION RIGHT  08/18/2016  . IR FLUORO GUIDE PORT INSERTION RIGHT  11/03/2016  . IR REMOVAL TUN CV CATH W/O FL  11/03/2016  . IR THORACENTESIS ASP PLEURAL SPACE W/IMG GUIDE  08/07/2016  . IR THORACENTESIS ASP PLEURAL SPACE W/IMG GUIDE  08/14/2016  . IR [place] VASC ACCESS RIGHT  08/18/2016  . IR [place] VASC ACCESS RIGHT  11/03/2016       Inpatient Medications: Scheduled Meds: . allopurinol  100 mg Oral BID  . Chlorhexidine Gluconate Cloth  6 each Topical Daily  . feeding supplement (ENSURE ENLIVE)  237 mL Oral BID BM  . furosemide  80 mg Intravenous Q12H  .  levalbuterol  0.63 mg Nebulization TID  . LORazepam  1 mg Intravenous Once  . methylPREDNISolone (SOLU-MEDROL) injection  60 mg Intravenous Q8H  . metoprolol tartrate  25 mg Oral TID  . sodium chloride flush  10-40 mL Intracatheter Q12H  . sodium chloride flush  3 mL Intravenous Q12H   Continuous Infusions: . amiodarone 30 mg/hr (04/04/17 1900)  . ceFEPime (MAXIPIME) IV Stopped (04/04/17 2202)  . diltiazem (CARDIZEM) infusion 10 mg/hr (04/04/17  1900)  . heparin 1,300 Units/hr (04/04/17 1900)  . potassium PHOSPHATE IVPB (mmol)    .  sodium bicarbonate (isotonic) infusion in sterile water 125 mL/hr at 04/05/17 0942   PRN Meds: acetaminophen, levalbuterol, morphine injection, ondansetron (ZOFRAN) IV, sodium chloride flush, sodium chloride flush, traZODone  Allergies:   No Known Allergies  Social History:   Social History   Socioeconomic History  . Marital status: Married    Spouse name: Not on file  . Number of children: Not on file  . Years of education: Not on file  . Highest education level: Not on file  Occupational History  . Not on file  Social Needs  . Financial resource strain: Not hard at all  . Food insecurity:    Worry: Never true    Inability: Never true  . Transportation needs:    Medical: No    Non-medical: No  Tobacco Use  . Smoking status: Former Smoker    Packs/day: 0.50    Years: 47.00    Pack years: 23.50    Types: Cigarettes    Last attempt to quit: 07/28/2016    Years since quitting: 0.6  . Smokeless tobacco: Never Used  Substance and Sexual Activity  . Alcohol use: No  . Drug use: No  . Sexual activity: Not on file  Lifestyle  . Physical activity:    Days per week: Patient refused    Minutes per session: Patient refused  . Stress: To some extent  Relationships  . Social connections:    Talks on phone: Not on file    Gets together: Not on file    Attends religious service: Not on file    Active member of club or organization: Not on file    Attends meetings of clubs or organizations: Not on file    Relationship status: Not on file  . Intimate partner violence:    Fear of current or ex partner: Not on file    Emotionally abused: Not on file    Physically abused: Not on file    Forced sexual activity: Not on file  Other Topics Concern  . Not on file  Social History Narrative  . Not on file    Family History:   Family History  Problem Relation Age of Onset  . COPD Mother   .  Diabetes Mellitus II Sister   . Diabetes Mellitus II Brother   . Diabetes Mellitus II Sister   . Stroke Sister   . Hypertension Sister   . Diabetes Mellitus II Sister      ROS:  Please see the history of present illness.  All other ROS reviewed and negative.     Physical Exam/Data:   Vitals:   04/05/17 0430 04/05/17 0500 04/05/17 0719 04/05/17 1145  BP: 111/83 124/74 100/77 113/72  Pulse: (!) 121 (!) 125 (!) 136 (!) 123  Resp: 20 (!) 22 (!) 22 (!) 29  Temp:   97.8 F (36.6 C) 98.6 F (37 C)  TempSrc:   Oral Oral  SpO2: 98% 100% 100% 100%  Weight:      Height:        Intake/Output Summary (Last 24 hours) at 04/05/2017 1206 Last data filed at 04/05/2017 1130 Gross per 24 hour  Intake 892.25 ml  Output 2275 ml  Net -1382.75 ml   Filed Weights   03/27/2017 1855 04/03/17 0358  Weight: 254 lb 10.1 oz (115.5 kg) 275 lb 2.2 oz (124.8 kg)   Body mass index is 51.99 kg/m.  General:  Well nourished, well developed, obese, she is in NAD HEENT: normal Neck: is obese, difficult to assess JVD Endocrine:  No thryomegaly Vascular: No carotid bruits  Cardiac:  IRRR, tachycardic; no murmurs, gallops or rubs are appreciated Lungs:  From ant auscultation only, are clear, no wheezing, rhonchi or rales  Abd: soft, nontender Ext: no edema Musculoskeletal:  No deformities Skin: warm and dry  Neuro:   R eye closed, full nero eval not done Psych:  Normal affect, plesent  EKG:  The EKG was personally reviewed and demonstrates:   04/03/17 AFib 137bpm Telemetry:  Telemetry was personally reviewed and demonstrates:   AFlutter 130's generally  Relevant CV Studies:  Echo 04/01/17 Study Conclusions  - HPI and indications: Atrial flutter 427.32. - Procedure narrative: Transthoracic echocardiography. Image quality was poor. The study was technically difficult, as a result of body habitus. - Left ventricle: The cavity size was normal. Wall thickness was increased in a pattern of  moderate LVH. Systolic function was vigorous. The estimated ejection fraction was 75%. The study is not technically sufficient to allow evaluation of LV diastolic function. - Mitral valve: Calcified annulus. Mildly thickened leaflets . At least mild stenosis. There was trivial regurgitation. Valve area by continuity equation (using LVOT flow): 1.68 cm^2. - Left atrium: Moderately dilated. - Atrial septum: There was increased thickness of the septum, consistent with lipomatous hypertrophy. - Tricuspid valve: There was mild regurgitation. - Pulmonary arteries: PA peak pressure: 32 mm Hg (S). - Inferior vena cava: The vessel was normal in size. The respirophasic diameter changes were in the normal range (>= 50%), consistent with normal central venous pressure. Impressions: - Technically difficult study. LVEF 75%, rapid atrial flutter is noted, moderate LVH, normal wall motion, MAC with at least mild stenosis and trivial MR, moderae LAE, mild TR, RVSP 32 mmHg, normal IVC, no pericardial effusion.  TEE/DCCV 04/18/2017 FINDINGS: Mild biatrial dilation. Excellent left atrial appendage emptying velocities. No thrombus. LVEF>65%. Mild MR, mild TR. Normal aorta. No pericardial effusion.  Cardioverted1time(s).  Cardioversion with synchronized biphasic120Jshock.    Laboratory Data:  Chemistry Recent Labs  Lab 04/04/17 1620 04/04/17 2140 04/05/17 0733  NA 138  138 140 141  K 4.0  4.0 4.1 3.6  CL 114*  114* 113* 112*  CO2 13*  13* 15* 16*  GLUCOSE 136*  137* 151* 153*  BUN 32*  33* 35* 38*  CREATININE 2.57*  2.57* 2.57* 2.55*  CALCIUM 7.8*  7.8* 7.9* 7.6*  GFRNONAA 18*  18* 18* 19*  GFRAA 21*  21* 21* 21*  ANIONGAP _0 Recent Labs  Lab 03/04/2017 1309 03/31/17 0513 04/04/17 0917 04/04/17 1620 04/05/17 0733  PROT 5.5* 4.8* 4.8*  --   --   ALBUMIN 2.8* 2.4* 2.4* 2.4* 2.4*  AST 47* 51* 47*  --   --   ALT _1 --   --     ALKPHOS 105 96 101  --   --  BILITOT 1.1 0.8 0.8  --   --    Hematology Recent Labs  Lab 04/04/17 0917 04/04/17 1620 04/05/17 0733  WBC 41.8* 38.9* 47.3*  RBC 2.90* 2.78* 2.89*  HGB 8.1* 7.9* 8.1*  HCT 23.4* 22.5* 23.0*  MCV 80.7 80.9 79.6  MCH 27.9 28.4 28.0  MCHC 34.6 35.1 35.2  RDW 21.1* 20.9* 21.2*  PLT 93* 83* 96*   Cardiac EnzymesNo results for input(s): TROPONINI in the last 168 hours. No results for input(s): TROPIPOC in the last 168 hours.  BNPNo results for input(s): BNP, PROBNP in the last 168 hours.  DDimer No results for input(s): DDIMER in the last 168 hours.  Radiology/Studies:  [place] Chest (pleural Effusion) Result Date: 04/04/2017 CLINICAL DATA:  Follow-up right pleural effusion EXAM: CHEST ULTRASOUND COMPARISON:  None. FINDINGS: Small right pleural effusion is noted. No sizable component to allow for safe thoracentesis is noted. IMPRESSION: Small right-sided pleural effusion. Electronically Signed   By: Inez Catalina M.D.   On: 04/04/2017 15:10   Dg Chest Port 1 View Result Date: 04/05/2017 CLINICAL DATA:  Respiratory failure short of breath EXAM: PORTABLE CHEST 1 VIEW COMPARISON:  04/04/2017 FINDINGS: Right jugular central venous catheter tip in the SVC unchanged. Extensive right lower lobe infiltrate unchanged with small right effusion. Mild left lower lobe atelectasis/infiltrate unchanged. IMPRESSION: Right lower lobe infiltrate unchanged, possible pneumonia. Small right effusion unchanged Left lower lobe atelectasis/infiltrate unchanged. Electronically Signed   By: Franchot Gallo M.D.   On: 04/05/2017 07:50    Dg Fluoro Guide Lumbar Puncture Result Date: 04/03/2017 CLINICAL DATA:  67 year old female with history of lymphoma and intrathecal chemotherapy presents with double vision, lower extremity weakness, atrial fibrillation with RVR. EXAM: DIAGNOSTIC LUMBAR PUNCTURE UNDER FLUOROSCOPIC GUIDANCE COMPARISON:  Lumbar MRI 02/08/2017. CT Abdomen and Pelvis 02/06/2017.  FLUOROSCOPY TIME:  Fluoroscopy Time:  0 minutes 18 seconds Radiation Exposure Index (if provided by the fluoroscopic device): 2.8 mGy Number of Acquired Spot Images: 0 PROCEDURE: Informed consent was obtained from the patient prior to the procedure, including potential complications of headache, allergy, and pain. A "time-out" was performed. With the patient prone, the lower back was prepped with Betadine. 1% Lidocaine was used for local anesthesia. Lumbar puncture was performed at the L2-L3 level using a 6 in x 20 gauge needle with return of slightly yellow tinged CSF. The CSF color appeared constant throughout the 4 tubes. Due to immobility, CSF opening pressure with the patient decubitus was not performed, but subjectively the CSF pressure was normal, with slow CSF flow through the 6 in spinal needle. 17 mL of CSF were obtained for laboratory studies. The patient tolerated the procedure well and there were no apparent complications. Appropriate post procedural orders were placed on the chart. The patient was returned to the inpatient floor in stable condition for continued treatment. IMPRESSION: Fluoroscopic guided lumbar puncture at L2-L3. 17 mL of yellow-tinged CSF were obtained for laboratory studies. Electronically Signed   By: Genevie Ann M.D.   On: 04/03/2017 14:05    Assessment and Plan:   1. Rapid AFib/flutter      A/c with heparin gtt Given comorbid conditions I do not think she is a good flutter ablation candidate and ahs been observed to have AFib as well.   This likely applies to pace/ablate option as well.  Anemia, Waxing/waning pulmonary status w/intermittent BIPAP, Infection/pneumonia, ARF, suspected tumor lysis syndrome, all contribute to difficulty on rate control.  Neuro is concerned for CNS involvement of her lymphoma  I do not know how much RVR is contributing to her clinical picture rather then a consequence if it. She is unaware of her heart rate/no palpitations or CP. Her BP looks  OK, is likely to tolerate some more BB, would try QID dosing and follow    Continue otherwise her with primary team    For questions or updates, please contact [place] Please consult [email or web address] for contact info under Cardiology/STEMI.   Signed, Baldwin Jamaica, PA-C  04/05/2017 12:06 PM   EP attending  Agree with above. The patient has developed atrial flutter but has a number of severe medical problems. For now would continue amiodarone. Her comorbidities make catheter ablation a poor option. Would try digoxin IV in addition to her other meds to help with rate control. Consider repeat DCCV next week. Continue IV amio in the interim.  Mikle Bosworth.D.

## 2017-04-06 ENCOUNTER — Inpatient Hospital Stay (HOSPITAL_COMMUNITY): Payer: Medicare Other

## 2017-04-06 ENCOUNTER — Ambulatory Visit: Payer: Medicare Other

## 2017-04-06 DIAGNOSIS — N183 Chronic kidney disease, stage 3 (moderate): Secondary | ICD-10-CM

## 2017-04-06 DIAGNOSIS — N170 Acute kidney failure with tubular necrosis: Secondary | ICD-10-CM

## 2017-04-06 DIAGNOSIS — E876 Hypokalemia: Secondary | ICD-10-CM

## 2017-04-06 LAB — CBC WITH DIFFERENTIAL/PLATELET
BLASTS: 0 %
Band Neutrophils: 8 %
Basophils Absolute: 0 10*3/uL (ref 0.0–0.1)
Basophils Relative: 0 %
Eosinophils Absolute: 0 10*3/uL (ref 0.0–0.7)
Eosinophils Relative: 0 %
HEMATOCRIT: 22.2 % — AB (ref 36.0–46.0)
HEMOGLOBIN: 7.7 g/dL — AB (ref 12.0–15.0)
LYMPHS ABS: 2.3 10*3/uL (ref 0.7–4.0)
LYMPHS PCT: 5 %
MCH: 27.3 pg (ref 26.0–34.0)
MCHC: 34.7 g/dL (ref 30.0–36.0)
MCV: 78.7 fL (ref 78.0–100.0)
METAMYELOCYTES PCT: 2 %
MONOS PCT: 3 %
Monocytes Absolute: 1.4 10*3/uL — ABNORMAL HIGH (ref 0.1–1.0)
Myelocytes: 0 %
NEUTROS ABS: 39.1 10*3/uL — AB (ref 1.7–7.7)
NRBC: 3 /100{WBCs} — AB
Neutrophils Relative %: 74 %
OTHER: 8 %
Platelets: 99 10*3/uL — ABNORMAL LOW (ref 150–400)
Promyelocytes Absolute: 0 %
RBC: 2.82 MIL/uL — AB (ref 3.87–5.11)
RDW: 20.7 % — AB (ref 11.5–15.5)
WBC: 46.5 10*3/uL — AB (ref 4.0–10.5)

## 2017-04-06 LAB — RENAL FUNCTION PANEL
ANION GAP: 17 — AB (ref 5–15)
Albumin: 2.3 g/dL — ABNORMAL LOW (ref 3.5–5.0)
BUN: 42 mg/dL — ABNORMAL HIGH (ref 6–20)
CALCIUM: 6.7 mg/dL — AB (ref 8.9–10.3)
CO2: 21 mmol/L — AB (ref 22–32)
Chloride: 103 mmol/L (ref 101–111)
Creatinine, Ser: 2.58 mg/dL — ABNORMAL HIGH (ref 0.44–1.00)
GFR calc Af Amer: 21 mL/min — ABNORMAL LOW (ref >60)
GFR calc non Af Amer: 18 mL/min — ABNORMAL LOW (ref >60)
GLUCOSE: 156 mg/dL — AB (ref 65–99)
POTASSIUM: 3 mmol/L — AB (ref 3.5–5.1)
Phosphorus: 1.7 mg/dL — ABNORMAL LOW (ref 2.5–4.6)
SODIUM: 141 mmol/L (ref 135–145)

## 2017-04-06 LAB — BASIC METABOLIC PANEL
ANION GAP: 12 (ref 5–15)
BUN: 42 mg/dL — ABNORMAL HIGH (ref 6–20)
CALCIUM: 6.3 mg/dL — AB (ref 8.9–10.3)
CO2: 22 mmol/L (ref 22–32)
Chloride: 103 mmol/L (ref 101–111)
Creatinine, Ser: 2.49 mg/dL — ABNORMAL HIGH (ref 0.44–1.00)
GFR, EST AFRICAN AMERICAN: 22 mL/min — AB (ref >60)
GFR, EST NON AFRICAN AMERICAN: 19 mL/min — AB (ref >60)
Glucose, Bld: 160 mg/dL — ABNORMAL HIGH (ref 65–99)
POTASSIUM: 3.1 mmol/L — AB (ref 3.5–5.1)
SODIUM: 137 mmol/L (ref 135–145)

## 2017-04-06 LAB — FUNGUS STAIN

## 2017-04-06 LAB — MAGNESIUM
MAGNESIUM: 1.6 mg/dL — AB (ref 1.7–2.4)
Magnesium: 1.5 mg/dL — ABNORMAL LOW (ref 1.7–2.4)

## 2017-04-06 LAB — HEPARIN LEVEL (UNFRACTIONATED): HEPARIN UNFRACTIONATED: 0.62 [IU]/mL (ref 0.30–0.70)

## 2017-04-06 LAB — FUNGAL STAIN REFLEX

## 2017-04-06 MED ORDER — SODIUM CHLORIDE 0.9 % IV SOLN
1.0000 g | Freq: Once | INTRAVENOUS | Status: AC
  Administered 2017-04-06: 1 g via INTRAVENOUS
  Filled 2017-04-06: qty 10

## 2017-04-06 MED ORDER — POTASSIUM CHLORIDE 10 MEQ/50ML IV SOLN
10.0000 meq | INTRAVENOUS | Status: AC
  Administered 2017-04-06 (×3): 10 meq via INTRAVENOUS
  Filled 2017-04-06: qty 50

## 2017-04-06 MED ORDER — DIGOXIN 0.25 MG/ML IJ SOLN: 0.1250 mg | Freq: Every day | INTRAMUSCULAR | Status: DC

## 2017-04-06 MED ORDER — DIGOXIN 0.25 MG/ML IJ SOLN: 0.2500 mg | Freq: Once | INTRAMUSCULAR | Status: DC

## 2017-04-06 MED ORDER — POTASSIUM CHLORIDE 10 MEQ/50ML IV SOLN: 10.0000 meq | INTRAVENOUS | Status: DC

## 2017-04-06 MED ORDER — MAGNESIUM SULFATE IN D5W 1-5 GM/100ML-% IV SOLN
1.0000 g | Freq: Once | INTRAVENOUS | Status: AC
  Administered 2017-04-06: 1 g via INTRAVENOUS
  Filled 2017-04-06: qty 100

## 2017-04-06 MED ORDER — DEXTROSE 5 % IV SOLN
30.0000 mmol | Freq: Once | INTRAVENOUS | Status: AC
  Administered 2017-04-06: 30 mmol via INTRAVENOUS
  Filled 2017-04-06: qty 10

## 2017-04-06 MED ORDER — DIGOXIN 0.25 MG/ML IJ SOLN
0.1250 mg | Freq: Every day | INTRAMUSCULAR | Status: AC
  Administered 2017-04-07 – 2017-04-08 (×2): 0.125 mg via INTRAVENOUS
  Filled 2017-04-06 (×2): qty 2

## 2017-04-06 MED ORDER — DIGOXIN 0.25 MG/ML IJ SOLN
0.2500 mg | INTRAMUSCULAR | Status: DC
  Administered 2017-04-06: 0.25 mg via INTRAVENOUS
  Filled 2017-04-06: qty 2

## 2017-04-06 MED ORDER — LEVALBUTEROL HCL 0.63 MG/3ML IN NEBU
0.6300 mg | INHALATION_SOLUTION | Freq: Two times a day (BID) | RESPIRATORY_TRACT | Status: DC
  Administered 2017-04-06 – 2017-04-07 (×2): 0.63 mg via RESPIRATORY_TRACT
  Filled 2017-04-06 (×2): qty 3

## 2017-04-06 NOTE — Progress Notes (Signed)
Progress Note  Patient Name: Jo Collins Date of Encounter: 04/06/2017  Primary Cardiologist: No primary care provider on file.   Subjective   No real progress. Remains in atrial flutter with 2:1 AV block. Appreciate Dr. Tanna Furry evaluation, but unfortunately there are no great options. He suggested adding iv digoxin and trying DCCV again next week.  Inpatient Medications    Scheduled Meds: . allopurinol  100 mg Oral BID  . Chlorhexidine Gluconate Cloth  6 each Topical Daily  . [START ON 04/07/2017] digoxin  0.125 mg Intravenous Daily  . digoxin  0.25 mg Intravenous Once  . feeding supplement  1 Container Oral BID BM  . furosemide  80 mg Intravenous Q12H  . levalbuterol  0.63 mg Nebulization TID  . LORazepam  1 mg Intravenous Once  . methylPREDNISolone (SOLU-MEDROL) injection  60 mg Intravenous Q8H  . metoprolol tartrate  25 mg Oral Q6H  . senna  2 tablet Oral QHS  . sodium chloride flush  10-40 mL Intracatheter Q12H  . sodium chloride flush  3 mL Intravenous Q12H   Continuous Infusions: . amiodarone 30 mg/hr (04/05/17 1900)  . ceFEPime (MAXIPIME) IV 1 g (04/05/17 2130)  . diltiazem (CARDIZEM) infusion 10 mg/hr (04/05/17 2252)  . heparin 1,300 Units/hr (04/05/17 1900)  .  sodium bicarbonate (isotonic) infusion in sterile water 125 mL/hr at 04/05/17 1900   PRN Meds: acetaminophen, fentaNYL (SUBLIMAZE) injection, levalbuterol, ondansetron (ZOFRAN) IV, sodium chloride flush, sodium chloride flush, traZODone   Vital Signs    Vitals:   04/05/17 2346 04/06/17 0300 04/06/17 0745 04/06/17 0859  BP: 99/75 111/62 109/71   Pulse: (!) 142 (!) 143 (!) 137   Resp: (!) 23 (!) 26 11   Temp: 98.4 F (36.9 C) (!) 97.3 F (36.3 C) 97.9 F (36.6 C)   TempSrc: Oral Oral Oral   SpO2: 99% 100% 100% 99%  Weight:      Height:        Intake/Output Summary (Last 24 hours) at 04/06/2017 0916 Last data filed at 04/06/2017 0915 Gross per 24 hour  Intake 284.7 ml  Output 3450 ml  Net  -3165.3 ml   Filed Weights   03/29/2017 1855 04/03/17 0358  Weight: 254 lb 10.1 oz (115.5 kg) 275 lb 2.2 oz (124.8 kg)    Telemetry    AFlutter with 2:1 AV block - Personally Reviewed  ECG    No new ecg - Personally Reviewed  Physical Exam  Morbidly obese GEN: No acute distress.   Neck: No JVD Cardiac: tachycardic, regular, no murmurs, rubs, or gallops.  Respiratory: Clear to auscultation bilaterally. GI: Soft, nontender, non-distended  MS: No edema; No deformity. 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  Psych: Normal affect   Labs    Chemistry Recent Labs  Lab 03/06/2017 1309 03/31/17 0513  04/04/17 0917 04/04/17 1620  04/05/17 0733 04/05/17 1656 04/05/17 2129 04/06/17 0649  NA 141 141   < > 140 138  138   < > 141 142 140 141  K 3.6 3.1*   < > 3.8 4.0  4.0   < > 3.6 3.2* 3.3* 3.0*  CL 103 107   < > 116* 114*  114*   < > 112* 110 108 103  CO2 21* 22   < > 13* 13*  13*   < > 16* 18* 19* 21*  GLUCOSE 89 87   < > 94 136*  137*   < > 153* 168* 176* 156*  BUN 38*  30*   < > 30* 32*  33*   < > 38* 40* 41* 42*  CREATININE 2.39* 2.22*   < > 2.41* 2.57*  2.57*   < > 2.55* 2.60* 2.60* 2.58*  CALCIUM 8.9 8.3*   < > 8.0* 7.8*  7.8*   < > 7.6* 7.4* 7.1* 6.7*  PROT 5.5* 4.8*  --  4.8*  --   --   --   --   --   --   ALBUMIN 2.8* 2.4*  --  2.4* 2.4*  --  2.4*  --   --  2.3*  AST 47* 51*  --  47*  --   --   --   --   --   --   ALT 25 25  --  23  --   --   --   --   --   --   ALKPHOS 105 96  --  101  --   --   --   --   --   --   BILITOT 1.1 0.8  --  0.8  --   --   --   --   --   --   GFRNONAA 20* 22*   < > 20* 18*  18*   < > 19* 18* 18* 18*  GFRAA 23* 25*   < > 23* 21*  21*   < > 21* 21* 21* 21*  ANIONGAP 17* 12   < > _0 < > _1 17*   < > = values in this interval not displayed.     Hematology Recent Labs  Lab 04/04/17 1620 04/05/17 0733 04/06/17 0649  WBC 38.9* 47.3* 46.5*  RBC 2.78* 2.89* 2.82*  HGB 7.9* 8.1* 7.7*  HCT 22.5* 23.0* 22.2*  MCV 80.9  79.6 78.7  MCH 28.4 28.0 27.3  MCHC 35.1 35.2 34.7  RDW 20.9* 21.2* 20.7*  PLT 83* 96* 99*    Cardiac EnzymesNo results for input(s): TROPONINI in the last 168 hours. No results for input(s): TROPIPOC in the last 168 hours.   BNPNo results for input(s): BNP, PROBNP in the last 168 hours.   DDimer No results for input(s): DDIMER in the last 168 hours.   Radiology    Mr Brain Wo Contrast  Result Date: 04/05/2017 CLINICAL DATA:  Recurrent B-cell lymphoma, assess for brain metastasis. EXAM: MRI HEAD WITHOUT CONTRAST TECHNIQUE: Axial diffusion weighted imaging, axial T2 FLAIR, axial T2 and axial T2 SWAN on a 3 tesla scanner. Patient was unable to tolerate further imaging. COMPARISON:  CT HEAD March 30, 2017 and MRI of the head February 07, 2016 FINDINGS: Sequences vary from moderate to severely motion degraded. INTRACRANIAL CONTENTS: No reduced diffusion to suggest acute ischemia or hypercellular tumor. No susceptibility artifact to suggest lobar hemorrhage though motion limits sensitivity for micro hemorrhages. The ventricles and sulci are normal for patient's age. Stable patchy supratentorial white matter FLAIR T2 hyperintensities. No suspicious parenchymal signal, definite masses, mass effect. No abnormal extra-axial fluid collections. No extra-axial masses. VASCULAR: Normal major intracranial vascular flow voids present at skull base. SKULL AND UPPER CERVICAL SPINE: Empty sella better demonstrated on prior MRI. No suspicious calvarial bone marrow signal. Craniocervical junction maintained. SINUSES/ORBITS: The mastoid air-cells and included paranasal sinuses are well-aerated.The included ocular globes and orbital contents are non-suspicious. OTHER: None. IMPRESSION: 1. Limited noncontrast motion degraded 4 sequence MRI of the head. 2. No acute intracranial process or definite intracranial lymphoma  though contrast enhanced sequences are more sensitive. 3. Stable mild-to-moderate chronic small vessel  ischemic disease. 4. Stable empty sella. Electronically Signed   By: Elon Alas M.D.   On: 04/05/2017 16:16   [place] Chest (pleural Effusion)  Result Date: 04/04/2017 CLINICAL DATA:  Follow-up right pleural effusion EXAM: CHEST ULTRASOUND COMPARISON:  None. FINDINGS: Small right pleural effusion is noted. No sizable component to allow for safe thoracentesis is noted. IMPRESSION: Small right-sided pleural effusion. Electronically Signed   By: Inez Catalina M.D.   On: 04/04/2017 15:10   Dg Chest Port 1 View  Result Date: 04/05/2017 CLINICAL DATA:  Respiratory failure short of breath EXAM: PORTABLE CHEST 1 VIEW COMPARISON:  04/04/2017 FINDINGS: Right jugular central venous catheter tip in the SVC unchanged. Extensive right lower lobe infiltrate unchanged with small right effusion. Mild left lower lobe atelectasis/infiltrate unchanged. IMPRESSION: Right lower lobe infiltrate unchanged, possible pneumonia. Small right effusion unchanged Left lower lobe atelectasis/infiltrate unchanged. Electronically Signed   By: Franchot Gallo M.D.   On: 04/05/2017 07:50    Cardiac Studies   Echo 04/01/17 Study Conclusions  - HPI and indications: Atrial flutter 427.32. - Procedure narrative: Transthoracic echocardiography. Image quality was poor. The study was technically difficult, as a result of body habitus. - Left ventricle: The cavity size was normal. Wall thickness was increased in a pattern of moderate LVH. Systolic function was vigorous. The estimated ejection fraction was 75%. The study is not technically sufficient to allow evaluation of LV diastolic function. - Mitral valve: Calcified annulus. Mildly thickened leaflets . At least mild stenosis. There was trivial regurgitation. Valve area by continuity equation (using LVOT flow): 1.68 cm^2. - Left atrium: Moderately dilated. - Atrial septum: There was increased thickness of the septum, consistent with lipomatous hypertrophy. -  Tricuspid valve: There was mild regurgitation. - Pulmonary arteries: PA peak pressure: 32 mm Hg (S). - Inferior vena cava: The vessel was normal in size. The respirophasic diameter changes were in the normal range (>= 50%), consistent with normal central venous pressure.  Impressions:  - Technically difficult study. LVEF 75%, rapid atrial flutter is noted, moderate LVH, normal wall motion, MAC with at least mild stenosis and trivial MR, moderae LAE, mild TR, RVSP 32 mmHg, normal IVC, no pericardial effusion.  TEE/DCCV 04/09/2017 FINDINGS: Mild biatrial dilation. Excellent left atrial appendage emptying velocities. No thrombus. LVEF>65%. Mild MR, mild TR. Normal aorta. No pericardial effusion.  Cardioverted1time(s).  Cardioversion with synchronized biphasic120Jshock.    Patient Profile     67 y.o. female with history of diffuse large B-cell lymphoma, sciatica, GERD, morbid obesity who presented with acute renal failure, pancytopenia and concern for pneumonia and sepsis. Underwent cardioversion for atrial flutter with rapid ventricular response on April 1, with return to atrial fibrillation roughly 16 hours later, now back in atrial flutter with 2: 1 AV block.  Assessment & Plan    1. AFlutter: Very few options beyond what we are already doing. Dr. Lovena Le suggested digoxin, but will have to wait for her K level to improve. Continue IV amiodarone with another attempt at [place] next week. Lower K levels may be related to bicarb administration. Will need to monitor carefully for dig toxicity and monitor dig levels, keeping in mind the amiodarone interaction. 2. ARF:  Stable last 24 hours. Will discuss K replacement and diuretics with Dr. Marval Regal. 3.Acute respiratory failure with hypoxia: Unilateral lung opacity and pleural effusion on the chest x-ray is more consistent with an infectious or inflammatory process, rather than  heart failure. Has normal left ventricular  systolic function, unable to assess diastolic function due to the arrhythmia. On the other hand, she is 14L positive and up >20 lb since admission.  3. 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 Morbid obesity   For questions or updates, please contact [place] Please consult [email or web address] for contact info under Cardiology/STEMI.      Signed, Sanda Klein, MD  04/06/2017, 9:16 AM

## 2017-04-06 NOTE — Progress Notes (Signed)
Patient ID: Jo Collins, female   DOB: 1950-12-13, 67 y.o.   MRN: [id number]  PROGRESS NOTE    Jo Collins  NIO:[id number] DOB: [birth year] DOA: 04/01/2017 PCP: Deland Pretty, MD   Brief Narrative:  67 year old female with history of paroxysmal atrial fibrillation not on anticoagulation, large B-cell lymphoma with ongoing chemotherapy and recent tumor lysis syndrome resulting in acute renal failure anemia and hospitalization, morbid obesity, chronic back pain, prior biopsy of the right iliac crest in January and ongoing issues with low back pain with radiation down to the left leg with weakness and numbness was admitted on 03/27/2017 with complaints of leg pain, diplopia.  She was admitted for A. fib with RVR and possible sepsis from pneumonia.  She was started on antibiotics.  Cardiology was consulted.  He had a TEE and  cardioversion on 04/28/2017 but then converted back to A. fib.  Neurology was also consulted for neurological symptoms an MRI was ordered.  MRI could not be done at [facility] so she was transferred to [facility] on 04/03/2017.  She had LP done on 04/29/2017.  Oncology is also following.  Nephrology was consulted for worsening renal function and acidosis. PCCM was reconsulted for worsening hypoxia.  Assessment & Plan:   Active Problems:   NHL (non-Hodgkin's lymphoma) (HCC)   Morbid obesity with BMI of 50.0-59.9, adult (HCC)   Diffuse large B cell lymphoma (HCC)   Recurrent right pleural effusion   Pleural effusion   Atrial fibrillation with tachycardic ventricular rate (HCC)   Atrial flutter (HCC)   Sepsis (HCC)   Leukocytosis   Acute on chronic renal failure (HCC)   HCAP (healthcare-associated pneumonia)   3rd nerve palsy, complete, right   Hyperuricemia   Acute respiratory failure with hypoxia ([place])   AKI (acute kidney injury) ([place])   Palliative care encounter   DNI (do not intubate)   Counseling regarding advance care planning and goals of care   Acute  hypoxic respiratory failure  -Probably from combination of pneumonia/volume overload/A. fib with RVR -Requiring intermittent BiPAP.  Off BiPAP possibly since 4/4 morning.  Continue oxygen supplementation as needed -Continue antibiotics and diuresis.  On empiric steroids as well -Critical care following: Ultrasound of chest showed only small effusion not significant to tap  -Strict input and output.  Daily weights -Patient has a positive fluid balance of 17,396.3 mL since admission -High risk of intubation - As per CCM follow-up, treat for pneumonia as indicated below.  CCM signed off 04/06/17.   Atrial flutter with RVR -Currently still tachycardic. -Cardiology following: Underwent TEE and DCCV on 05/01/2017 -Currently on Cardizem and amiodarone drips -Not on anticoagulation as an outpatient.  Currently on heparin drip -Cardiology and EP cardiology input appreciated.  Not candidate for ablation due to multiple significant comorbidities.  EP suggested digoxin but waiting for hypokalemia to improve.  Plans for attempt at [facility] next week again.  Sepsis from pneumonia -Cultures negative so far.  Continue antibiotics as below  Right-sided pneumonia with pleural effusion -Currently on cefepime. No evidence of MRSA pneumonia. vancomycin discontinued on 04/04/2017.  No significant pleural effusion as per PCCM -As per CCM follow-up, would treat with cefepime for 5 days and then transition to oral antibiotics for total of 10-14 days depending on resolution of infiltrate.  Leukocytosis -Worsening. Probably from pneumonia/A. fib/hypoxia and lymphoma.  Follow CBCs closely.  Acute renal failure with metabolic acidosis -Questionable prerenal versus tumor lysis syndrome.  Baseline creatinine less than 1 -Nephrology following.  If renal function worsens, patient might need CVVHD and transfer to ICU.  Monitor strict input and output. -UA was negative for infection and renal ultrasound was negative for  hydronephrosis -Currently on intravenous Lasix and bicarb drip -Creatinine has slightly improved from 2.62 days ago to 2.4.  Nephrology follow-up appreciated.  Hypokalemia/hypomagnesemia -Replacing IV.  Follow BMP and magnesium in a.m.  Right 3rd nerve palsy and left leg weakness in the setting of B-cell lymphoma -Neurology following.  Status post LP done on 04/03/2017 with cytology pending -MRI of the brain along with MRA/MRV of the head and MRI of the lumbosacral spine/sacral plexus as per neurology recommendations: This will be few hours study.  I do not think patient will be able to undergo all these MRI with current condition.  I think it would be safe for her to be on a ventilator prior to getting these tests done. -As per my discussion with Dr. Leonel Ramsay, Neurology, patient unable to tolerate performing MRI due to back pain and claustrophobia.  MRI that was done was a limited study and showed no acute findings.  Not candidate for performing under sedation due to concern for landing up on ventilator and unable to wean.  Will need to update and discuss with her primary oncologist.  Relapsed diffuse B-cell lymphoma -Oncology following. -Overall prognosis is guarded -Discussed with palliative care, now DNI and they will continue to follow.  Overall poor prognosis.  Anemia and thrombocytopenia -Probably from lymphoma and chemotherapy -Transfuse to keep hemoglobin above 7.  Relatively stable today.  Morbid obesity -Outpatient follow-up  DVT prophylaxis: Heparin drip Code Status: Full Family Communication: Spoke to husband at bedside, updated care and answered questions. Disposition Plan: Depends on clinical outcome.  Not medically stable for discharge.  Consultants: Neurology/cardiology/PCCM/oncology/palliative care team.  Procedures: TEE/DCCV 04/25/2017 Mild biatrial dilation. Excellent left atrial appendage emptying velocities. No thrombus. LVEF>65%. Mild MR, mild TR. Normal  aorta. No pericardial effusion.  Cardioverted1time(s).  Cardioversion with synchronized biphasic120Jshock.  Echo 04/01/17 Study Conclusions  - HPI and indications: Atrial flutter 427.32. - Procedure narrative: Transthoracic echocardiography. Image quality was poor. The study was technically difficult, as a result of body habitus. - Left ventricle: The cavity size was normal. Wall thickness was increased in a pattern of moderate LVH. Systolic function was vigorous. The estimated ejection fraction was 75%. The study is not technically sufficient to allow evaluation of LV diastolic function. - Mitral valve: Calcified annulus. Mildly thickened leaflets . At least mild stenosis. There was trivial regurgitation. Valve area by continuity equation (using LVOT flow): 1.68 cm^2. - Left atrium: Moderately dilated. - Atrial septum: There was increased thickness of the septum, consistent with lipomatous hypertrophy. - Tricuspid valve: There was mild regurgitation. - Pulmonary arteries: PA peak pressure: 32 mm Hg (S). - Inferior vena cava: The vessel was normal in size. The respirophasic diameter changes were in the normal range (>= 50%), consistent with normal central venous pressure.  Impressions:  - Technically difficult study. LVEF 75%, rapid atrial flutter is noted, moderate LVH, normal wall motion, MAC with at least mild stenosis and trivial MR, moderae LAE, mild TR, RVSP 32 mmHg, normal IVC, no pericardial effusion.   Antimicrobials:  Cefepime from 03/14/2017 onwards Vancomycin from 03/22/2017-04/03/17  Subjective: Patient interviewed and examined along with her spouse at bedside.  States that she feels somewhat better with improvement in her breathing.  Even pain seems better controlled.  Denies any other complaints.  As per RN, was unable to tolerate MRI and some  swallowing issues, speech therapy consulted..  Objective: Vitals:   04/06/17 0300  04/06/17 0745 04/06/17 0859 04/06/17 1100  BP: 111/62 109/71  (!) 104/56  Pulse: (!) 143 (!) 137    Resp: (!) 26 11  18   Temp: (!) 97.3 F (36.3 C) 97.9 F (36.6 C)  97.7 F (36.5 C)  TempSrc: Oral Oral  Oral  SpO2: 100% 100% 99%   Weight:      Height:        Intake/Output Summary (Last 24 hours) at 04/06/2017 1509 Last data filed at 04/06/2017 1100 Gross per 24 hour  Intake 284.7 ml  Output 3275 ml  Net -2990.3 ml   Filed Weights   03/28/2017 1855 04/03/17 0358  Weight: 115.5 kg (254 lb 10.1 oz) 124.8 kg (275 lb 2.2 oz)    Examination:  General exam: Middle-aged female, moderately built and nourished, chronically ill looking, sitting up comfortably in chair this morning. Respiratory system: Reduced breath sounds in the bases with few bibasilar crackles.  No wheezing or rhonchi appreciated.  No increased work of breathing. cardiovascular system: S1 and S2 heard, irregularly irregular and tachycardic.  No JVD or murmurs.  1+ leg edema.  Telemetry personally reviewed and shows atrial flutter in the 130s-140s.  Variable block. gastrointestinal system: Abdomen is morbidly obese, nondistended, soft and nontender. Normal bowel sounds heard.  Able without change. Central nervous system: Alert and oriented x2.  However keeps repeating sentences at times.  No new focal neurological deficits. Extremities: Moves all limbs symmetrically.  skin: No rashes, lesions or ulcers Psychiatry: Pleasant and appropriate.   Data Reviewed: I have personally reviewed following labs and imaging studies  CBC: Recent Labs  Lab 04/03/2017 0500 04/03/17 0500 04/04/17 0917 04/04/17 1620 04/05/17 0733 04/06/17 0649  WBC 33.0* 42.3* 41.8* 38.9* 47.3* 46.5*  NEUTROABS 25.4*  --  29.7* 28.4*  --  39.1*  HGB 7.7* 7.8* 8.1* 7.9* 8.1* 7.7*  HCT 23.1* 23.1* 23.4* 22.5* 23.0* 22.2*  MCV 83.1 81.1 80.7 80.9 79.6 78.7  PLT 124* 121* 93* 83* 96* 99*   Basic Metabolic Panel: Recent Labs  Lab 03/31/17 1330  04/01/17 0430  04/04/17 0917 04/04/17 1620  04/05/17 0733 04/05/17 1656 04/05/17 2129 04/06/17 0649 04/06/17 1325  NA  --  141   < > 140 138  138   < > 141 142 140 141 137  K  --  4.4   < > 3.8 4.0  4.0   < > 3.6 3.2* 3.3* 3.0* 3.1*  CL  --  110   < > 116* 114*  114*   < > 112* 110 108 103 103  CO2  --  20*   < > 13* 13*  13*   < > 16* 18* 19* 21* 22  GLUCOSE  --  87   < > 94 136*  137*   < > 153* 168* 176* 156* 160*  BUN  --  28*   < > 30* 32*  33*   < > 38* 40* 41* 42* 42*  CREATININE  --  2.20*   < > 2.41* 2.57*  2.57*   < > 2.55* 2.60* 2.60* 2.58* 2.49*  CALCIUM  --  8.6*   < > 8.0* 7.8*  7.8*   < > 7.6* 7.4* 7.1* 6.7* 6.3*  MG 2.2 2.0  --  1.7  --   --   --   --   --  1.5* 1.6*  PHOS  --   --   --   --  <  1.0*  --  1.0*  --   --  1.7*  --    < > = values in this interval not displayed.   GFR: Estimated Creatinine Clearance: 27.6 mL/min (A) (by C-G formula based on SCr of 2.49 mg/dL (H)). Liver Function Tests: Recent Labs  Lab 03/31/17 0513 04/04/17 0917 04/04/17 1620 04/05/17 0733 04/06/17 0649  AST 51* 47*  --   --   --   ALT 25 23  --   --   --   ALKPHOS 96 101  --   --   --   BILITOT 0.8 0.8  --   --   --   PROT 4.8* 4.8*  --   --   --   ALBUMIN 2.4* 2.4* 2.4* 2.4* 2.3*   Sepsis Labs: Recent Labs  Lab 03/31/17 1433 04/01/17 0850 04/01/17 1541 04/04/17 2140  LATICACIDVEN 3.1* 2.4* 1.9 1.9    Recent Results (from the past 240 hour(s))  Blood Culture (routine x 2)     Status: None   Collection Time: 04/01/2017  1:40 PM  Result Value Ref Range Status   Specimen Description   Final    BLOOD LEFT ANTECUBITAL Performed at [facility], [place] [address], [place], [place] [address]    Special Requests   Final    BOTTLES DRAWN AEROBIC AND ANAEROBIC Blood Culture results may not be optimal due to an inadequate volume of blood received in culture bottles Performed at [place] [address], [place], [place]  [address]    Culture   Final    NO GROWTH 5 DAYS Performed at [facility], [place] [address] [place], [place] [address]    Report Status 04/04/2017 FINAL  Final  MRSA PCR Screening     Status: None   Collection Time: 03/18/2017  6:56 PM  Result Value Ref Range Status   MRSA by PCR NEGATIVE NEGATIVE Final    Comment:        The GeneXpert MRSA Assay (FDA approved for NASAL specimens only), is one component of a comprehensive MRSA colonization surveillance program. It is not intended to diagnose MRSA infection nor to guide or monitor treatment for MRSA infections. Performed at [facility], [place] [address] [place], [place] [address]   Blood Culture (routine x 2)     Status: None   Collection Time: 03/20/2017  7:14 PM  Result Value Ref Range Status   Specimen Description   Final    BLOOD RIGHT HAND Performed at [facility] [address] [place], [place] [address]    Special Requests   Final    AEB BCLV Performed at [place] [address], [place], [place] [address]    Culture   Final    NO GROWTH 5 DAYS Performed at [facility], [place] [address], [place], [place] [address]    Report Status 04/04/2017 FINAL  Final  Fungus Stain     Status: None   Collection Time: 04/03/17  1:47 PM  Result Value Ref Range Status   FUNGUS STAIN Final report  Final    Comment: (NOTE) Performed At: [facility] [place], [place] [id number] Rush Farmer MD [id number]    Fungal Source CSF  Final    Comment: Performed at [facility], [place] [address] [place],  [address]  Fungal Stain reflex     Status: None   Collection Time:  04/03/17  1:47 PM  Result Value Ref Range Status   Fungal stain result 1 Comment  Final    Comment: (NOTE) KOH/Calcofluor preparation:  no fungus observed. Performed At: [facility] [place], [place] [id number] Rush Farmer MD EU:[id number] Performed at [facility], [place] [address] [place], [place] [address]   CSF culture     Status: None (Preliminary result)   Collection Time: 04/03/17  1:54 PM  Result Value Ref Range Status   Specimen Description CSF  Final   Special Requests NONE  Final   Gram Stain   Final    NO WBC SEEN NO ORGANISMS SEEN CYTOSPIN SMEAR Gram Stain Report Called to,Read Back By and Verified With: E.MATHAI AT 1618 ON 04/03/17 BY N.THOMPSON Performed at [facility], [place] [address] [place], [place] [address]    Culture PENDING  Incomplete   Report Status PENDING  Incomplete         Radiology Studies: Mr Brain Wo Contrast  Result Date: 04/05/2017 CLINICAL DATA:  Recurrent B-cell lymphoma, assess for brain metastasis. EXAM: MRI HEAD WITHOUT CONTRAST TECHNIQUE: Axial diffusion weighted imaging, axial T2 FLAIR, axial T2 and axial T2 SWAN on a 3 tesla scanner. Patient was unable to tolerate further imaging. COMPARISON:  CT HEAD March 30, 2017 and MRI of the head February 07, 2016 FINDINGS: Sequences vary from moderate to severely motion degraded. INTRACRANIAL CONTENTS: No reduced diffusion to suggest acute ischemia or hypercellular tumor. No susceptibility artifact to suggest lobar hemorrhage though motion limits sensitivity for micro hemorrhages. The ventricles and sulci are normal for patient's age. Stable patchy supratentorial white matter FLAIR T2 hyperintensities. No suspicious parenchymal signal, definite masses, mass effect. No abnormal extra-axial fluid collections. No extra-axial masses. VASCULAR: Normal major intracranial vascular flow voids present at skull base. SKULL AND UPPER CERVICAL SPINE: Empty sella better demonstrated on prior MRI. No suspicious calvarial bone marrow signal. Craniocervical junction maintained. SINUSES/ORBITS: The mastoid air-cells and included paranasal sinuses are well-aerated.The included ocular globes and orbital  contents are non-suspicious. OTHER: None. IMPRESSION: 1. Limited noncontrast motion degraded 4 sequence MRI of the head. 2. No acute intracranial process or definite intracranial lymphoma though contrast enhanced sequences are more sensitive. 3. Stable mild-to-moderate chronic small vessel ischemic disease. 4. Stable empty sella. Electronically Signed   By: Elon Alas M.D.   On: 04/05/2017 16:16   Dg Chest Port 1 View  Result Date: 04/06/2017 CLINICAL DATA:  Respiratory failure.  Shortness of breath. EXAM: PORTABLE CHEST 1 VIEW COMPARISON:  04/05/2017; 04/04/2017; 10/10/2026 chest CT-02/06/2017 FINDINGS: Grossly unchanged cardiac silhouette and mediastinal contours. Stable positioning of support apparatus. Improved aeration of lung bases with persistent right heterogeneous/consolidative opacities and suspected trace right-sided effusion. No new focal airspace opacities. No pneumothorax. No evidence of edema. No acute osseus abnormalities. IMPRESSION: Improved aeration the lung bases with persistent right basilar opacities and suspected trace right-sided effusion, similar to the 10/2016 examination and thus favored to represent atelectasis or scar Electronically Signed   By: Sandi Mariscal M.D.   On: 04/06/2017 09:30   Dg Chest Port 1 View  Result Date: 04/05/2017 CLINICAL DATA:  Respiratory failure short of breath EXAM: PORTABLE CHEST 1 VIEW COMPARISON:  04/04/2017 FINDINGS: Right jugular central venous catheter tip in the SVC unchanged. Extensive right lower lobe infiltrate unchanged with small right effusion. Mild left lower lobe atelectasis/infiltrate unchanged. IMPRESSION: Right lower lobe infiltrate unchanged, possible pneumonia. Small right effusion unchanged Left lower lobe atelectasis/infiltrate unchanged. Electronically  Signed   By: Franchot Gallo M.D.   On: 04/05/2017 07:50        Scheduled Meds: . allopurinol  100 mg Oral BID  . Chlorhexidine Gluconate Cloth  6 each Topical Daily  .  [START ON 04/07/2017] digoxin  0.125 mg Intravenous Daily  . feeding supplement  1 Container Oral BID BM  . furosemide  80 mg Intravenous Q12H  . levalbuterol  0.63 mg Nebulization BID  . LORazepam  1 mg Intravenous Once  . methylPREDNISolone (SOLU-MEDROL) injection  60 mg Intravenous Q8H  . metoprolol tartrate  25 mg Oral Q6H  . senna  2 tablet Oral QHS  . sodium chloride flush  10-40 mL Intracatheter Q12H  . sodium chloride flush  3 mL Intravenous Q12H   Continuous Infusions: . amiodarone 30 mg/hr (04/05/17 1900)  . ceFEPime (MAXIPIME) IV 1 g (04/05/17 2130)  . diltiazem (CARDIZEM) infusion 10 mg/hr (04/05/17 2252)  . heparin 1,300 Units/hr (04/05/17 1900)  . potassium PHOSPHATE IVPB (mmol) 30 mmol (04/06/17 1412)  .  sodium bicarbonate (isotonic) infusion in sterile water 125 mL/hr at 04/05/17 1900     LOS: 7 days        Vernell Leep, MD, [place], [facility]. [facility] Pager [phone]  If 7PM-7AM, please contact night-coverage [email or web address] Password [email or web address] 04/06/2017, 3:30 PM

## 2017-04-06 NOTE — Progress Notes (Signed)
S: no changes overnight and remains tachycardic O:BP 109/71 (BP Location: Right Leg)   Pulse (!) 137   Temp 97.9 F (36.6 C) (Oral)   Resp 11   Ht 5\' 1"  (1.549 m)   Wt 124.8 kg (275 lb 2.2 oz)   SpO2 99%   BMI 51.99 kg/m   Intake/Output Summary (Last 24 hours) at 04/06/2017 1128 Last data filed at 04/06/2017 0915 Gross per 24 hour  Intake 284.7 ml  Output 3450 ml  Net -3165.3 ml   Intake/Output: I/O last 3 completed shifts: In: 164.7 [I.V.:164.7] Out: 3650 [Urine:3650]  Intake/Output this shift:  Total I/O In: 120 [P.O.:120] Out: 1300 [Urine:1300] Weight change:  Gen: ill-appearing AAF lying in bed BJY:NWGNF Resp: bilateral rhonchi Abd:+BS Ext:+1 edema  Recent Labs  Lab 03/16/2017 1309 03/31/17 0513  04/04/17 0917 04/04/17 1620 04/04/17 2140 04/05/17 0733 04/05/17 1656 04/05/17 2129 04/06/17 0649  NA 141 141   < > 140 138  138 140 141 142 140 141  K 3.6 3.1*   < > 3.8 4.0  4.0 4.1 3.6 3.2* 3.3* 3.0*  CL 103 107   < > 116* 114*  114* 113* 112* 110 108 103  CO2 21* 22   < > 13* 13*  13* 15* 16* 18* 19* 21*  GLUCOSE 89 87   < > 94 136*  137* 151* 153* 168* 176* 156*  BUN 38* 30*   < > 30* 32*  33* 35* 38* 40* 41* 42*  CREATININE 2.39* 2.22*   < > 2.41* 2.57*  2.57* 2.57* 2.55* 2.60* 2.60* 2.58*  ALBUMIN 2.8* 2.4*  --  2.4* 2.4*  --  2.4*  --   --  2.3*  CALCIUM 8.9 8.3*   < > 8.0* 7.8*  7.8* 7.9* 7.6* 7.4* 7.1* 6.7*  PHOS  --   --   --   --  <1.0*  --  1.0*  --   --  1.7*  AST 47* 51*  --  47*  --   --   --   --   --   --   ALT 25 25  --  23  --   --   --   --   --   --    < > = values in this interval not displayed.   Liver Function Tests: Recent Labs  Lab 03/17/2017 1309 03/31/17 0513 04/04/17 0917 04/04/17 1620 04/05/17 0733 04/06/17 0649  AST 47* 51* 47*  --   --   --   ALT 25 25 23   --   --   --   ALKPHOS 105 96 101  --   --   --   BILITOT 1.1 0.8 0.8  --   --   --   PROT 5.5* 4.8* 4.8*  --   --   --   ALBUMIN 2.8* 2.4* 2.4* 2.4* 2.4* 2.3*    No results for input(s): LIPASE, AMYLASE in the last 168 hours. No results for input(s): AMMONIA in the last 168 hours. CBC: Recent Labs  Lab 04/03/17 0500 04/04/17 0917 04/04/17 1620 04/05/17 0733 04/06/17 0649  WBC 42.3* 41.8* 38.9* 47.3* 46.5*  NEUTROABS  --  29.7* 28.4*  --  39.1*  HGB 7.8* 8.1* 7.9* 8.1* 7.7*  HCT 23.1* 23.4* 22.5* 23.0* 22.2*  MCV 81.1 80.7 80.9 79.6 78.7  PLT 121* 93* 83* 96* 99*   Cardiac Enzymes: No results for input(s): CKTOTAL, CKMB, CKMBINDEX, TROPONINI in the last 168 hours.  CBG: No results for input(s): GLUCAP in the last 168 hours.  Iron Studies: No results for input(s): IRON, TIBC, TRANSFERRIN, FERRITIN in the last 72 hours. Studies/Results: Mr Brain Wo Contrast  Result Date: 04/05/2017 CLINICAL DATA:  Recurrent B-cell lymphoma, assess for brain metastasis. EXAM: MRI HEAD WITHOUT CONTRAST TECHNIQUE: Axial diffusion weighted imaging, axial T2 FLAIR, axial T2 and axial T2 SWAN on a 3 tesla scanner. Patient was unable to tolerate further imaging. COMPARISON:  CT HEAD March 30, 2017 and MRI of the head February 07, 2016 FINDINGS: Sequences vary from moderate to severely motion degraded. INTRACRANIAL CONTENTS: No reduced diffusion to suggest acute ischemia or hypercellular tumor. No susceptibility artifact to suggest lobar hemorrhage though motion limits sensitivity for micro hemorrhages. The ventricles and sulci are normal for patient's age. Stable patchy supratentorial white matter FLAIR T2 hyperintensities. No suspicious parenchymal signal, definite masses, mass effect. No abnormal extra-axial fluid collections. No extra-axial masses. VASCULAR: Normal major intracranial vascular flow voids present at skull base. SKULL AND UPPER CERVICAL SPINE: Empty sella better demonstrated on prior MRI. No suspicious calvarial bone marrow signal. Craniocervical junction maintained. SINUSES/ORBITS: The mastoid air-cells and included paranasal sinuses are well-aerated.The  included ocular globes and orbital contents are non-suspicious. OTHER: None. IMPRESSION: 1. Limited noncontrast motion degraded 4 sequence MRI of the head. 2. No acute intracranial process or definite intracranial lymphoma though contrast enhanced sequences are more sensitive. 3. Stable mild-to-moderate chronic small vessel ischemic disease. 4. Stable empty sella. Electronically Signed   By: Elon Alas M.D.   On: 04/05/2017 16:16   [place] Chest (pleural Effusion)  Result Date: 04/04/2017 CLINICAL DATA:  Follow-up right pleural effusion EXAM: CHEST ULTRASOUND COMPARISON:  None. FINDINGS: Small right pleural effusion is noted. No sizable component to allow for safe thoracentesis is noted. IMPRESSION: Small right-sided pleural effusion. Electronically Signed   By: Inez Catalina M.D.   On: 04/04/2017 15:10   Dg Chest Port 1 View  Result Date: 04/06/2017 CLINICAL DATA:  Respiratory failure.  Shortness of breath. EXAM: PORTABLE CHEST 1 VIEW COMPARISON:  04/05/2017; 04/04/2017; 10/10/2026 chest CT-02/06/2017 FINDINGS: Grossly unchanged cardiac silhouette and mediastinal contours. Stable positioning of support apparatus. Improved aeration of lung bases with persistent right heterogeneous/consolidative opacities and suspected trace right-sided effusion. No new focal airspace opacities. No pneumothorax. No evidence of edema. No acute osseus abnormalities. IMPRESSION: Improved aeration the lung bases with persistent right basilar opacities and suspected trace right-sided effusion, similar to the 10/2016 examination and thus favored to represent atelectasis or scar Electronically Signed   By: Sandi Mariscal M.D.   On: 04/06/2017 09:30   Dg Chest Port 1 View  Result Date: 04/05/2017 CLINICAL DATA:  Respiratory failure short of breath EXAM: PORTABLE CHEST 1 VIEW COMPARISON:  04/04/2017 FINDINGS: Right jugular central venous catheter tip in the SVC unchanged. Extensive right lower lobe infiltrate unchanged with small right  effusion. Mild left lower lobe atelectasis/infiltrate unchanged. IMPRESSION: Right lower lobe infiltrate unchanged, possible pneumonia. Small right effusion unchanged Left lower lobe atelectasis/infiltrate unchanged. Electronically Signed   By: Franchot Gallo M.D.   On: 04/05/2017 07:50   . allopurinol  100 mg Oral BID  . Chlorhexidine Gluconate Cloth  6 each Topical Daily  . [START ON 04/07/2017] digoxin  0.125 mg Intravenous Daily  . digoxin  0.25 mg Intravenous Q2H  . feeding supplement  1 Container Oral BID BM  . furosemide  80 mg Intravenous Q12H  . levalbuterol  0.63 mg Nebulization BID  . LORazepam  1 mg  Intravenous Once  . methylPREDNISolone (SOLU-MEDROL) injection  60 mg Intravenous Q8H  . metoprolol tartrate  25 mg Oral Q6H  . senna  2 tablet Oral QHS  . sodium chloride flush  10-40 mL Intracatheter Q12H  . sodium chloride flush  3 mL Intravenous Q12H    BMET    Component Value Date/Time   NA 141 04/06/2017 0649   NA 143 12/07/2016 0823   K 3.0 (L) 04/06/2017 0649   K 3.5 12/07/2016 0823   CL 103 04/06/2017 0649   CO2 21 (L) 04/06/2017 0649   CO2 24 12/07/2016 0823   GLUCOSE 156 (H) 04/06/2017 0649   GLUCOSE 94 12/07/2016 0823   BUN 42 (H) 04/06/2017 0649   BUN 9.3 12/07/2016 0823   CREATININE 2.58 (H) 04/06/2017 0649   CREATININE 2.48 (H) 03/28/2017 1038   CREATININE 0.7 12/07/2016 0823   CALCIUM 6.7 (L) 04/06/2017 0649   CALCIUM 8.9 12/07/2016 0823   GFRNONAA 18 (L) 04/06/2017 0649   GFRNONAA 19 (L) 03/21/2017 1038   GFRAA 21 (L) 04/06/2017 0649   GFRAA 22 (L) 03/06/2017 1038   CBC    Component Value Date/Time   WBC 46.5 (H) 04/06/2017 0649   RBC 2.82 (L) 04/06/2017 0649   HGB 7.7 (L) 04/06/2017 0649   HGB 8.8 (L) 12/07/2016 0823   HCT 22.2 (L) 04/06/2017 0649   HCT 27.9 (L) 12/07/2016 0823   PLT 99 (L) 04/06/2017 0649   PLT 16 (L) 03/16/2017 0753   PLT 213 12/07/2016 0823   MCV 78.7 04/06/2017 0649   MCV 90.9 12/07/2016 0823   MCH 27.3 04/06/2017 0649    MCHC 34.7 04/06/2017 0649   RDW 20.7 (H) 04/06/2017 0649   RDW 15.6 (H) 12/07/2016 0823   LYMPHSABS 2.3 04/06/2017 0649   LYMPHSABS 0.4 (L) 12/07/2016 0823   MONOABS 1.4 (H) 04/06/2017 0649   MONOABS 0.6 12/07/2016 0823   EOSABS 0.0 04/06/2017 0649   EOSABS 0.0 12/07/2016 0823   BASOSABS 0.0 04/06/2017 0649   BASOSABS 0.0 12/07/2016 0823     Assessment/Plan: 1. 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 Doubt tumor lysis syndrome given hypophosphatemia.  More consistent with ischemic ATN in setting of volume depletion, tachycardia, relative hypotensive and acute illness.  Urine output is picking up and creatinine stabilizing. 1. Discussed possible renal replacement with the patient and her family 04/04/17. Overall prognosis is poor and recommend palliative care consult to help set goals/limits of care.  2. Respiratory distress with acute hypoxemic respiratory failure- currently off BiPap but still tachypnic.   3. Right mid and lower lung consolidation consistent with HCAP.  abx per primary team 4. Hypokalemia- will replete with IV KCl  5. Hypomagnesemia- d/w dr. Carson Myrtle, 1 gram IV and follow 6. Metabolic acidosis due to #1- agree with isotonic bicarb drip but with pulmonary edema and peripheral edema. Ok to give lasix and bicarb and follow 7. Refractory DLBCL despite multiple chemo regimens. Prognosis is grim 8. Hypophosphatemia- likely due to poor po intake doubt Fanconi syndrome due to lack of glucosuria.  Will replete and follow.  9. 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 Thrombocytopenia- due to malignancy 11. A flutter with RVR failed DCCV and now on amio and cardizem drip with evidence of pulmonary edema and diastolic heart failure. Cardiology following and agree that improving potassium level might help with tachycardia.  EP evaluated pt and no good options due her overall condition. 12. 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 13. Disposition- overall prognosis is poor. Recommend palliative care consult to help set goal/limits of care as she is currently a full code and probability of survival is low. Consider transitioning to comfort care.    Jo Potts, MD [employer] [phone]

## 2017-04-06 NOTE — Progress Notes (Signed)
Subjective: Husband at bedside. Continues to Left leg weakness and now complaining of Right leg weakness on exam, Continues to have SOB but denies any diplopia. Was taken to MRI overnight but patient unable to tolerate testing despite pain medication for complaints of lower back pain. Limited study did not demonstrate any acute process. Discussed at length importance of MRI testing for definitive diagnosis with patient, she states she can not lay still and does not want to go into the MRI machine for more testing. Patient unlikely to get MRI under anesthesia due to respiratory status.  Exam: Vitals:   04/06/17 0300 04/06/17 0745  BP: 111/62 109/71  Pulse: (!) 143 (!) 137  Resp: (!) 26 11  Temp: (!) 97.3 F (36.3 C) 97.9 F (36.6 C)  SpO2: 100% 100%   Physical Exam   HEENT-  Normocephalic, no lesions, Normal external eye and conjunctiva.   Cardiovascular- regular rate and rhythm  Lungs-no wheezing noted,   +SOB, Saturations within normal limits Abdomen- soft and nontender Extremities- Warm, dry and intact Musculoskeletal-no joint tenderness, deformity or swelling Skin-warm and dry,   Neuro:  Neuro: MS: Awake, alert, interactive and appropriate CN: RIGHT third nerve palsy with severely restricted EOMI--eye remains midline with EOM testing, mostly pupillary sparing, but slight anisocoria with the right slightly larger than the left. EOMI on left. Motor: She continues to have  left leg weakness she has 3/5 strength proximally, 1-2/5 distally, able to briefly elevate leg off the bed. Right leg is slightly weaker today with 3-4/5 strength distal/proximal. Sensory: intact sensation today Bilaterally  Pertinent Labs/Diagnostics: MRI HEAD WITHOUT CONTRAST 04/05/2017 16:16 IMPRESSION: 1. Limited noncontrast motion degraded 4 sequence MRI of the head. 2. No acute intracranial process or definite intracranial lymphoma though contrast enhanced sequences are more sensitive. 3. Stable  mild-to-moderate chronic small vessel ischemic disease. 4. Stable empty sella.  CSF WBC 2 CSF protein 245 CSF glucose normal.  Culture with no growth  CSF fungal stain - PENDING  Cytology/Flow PENDING MRI brain/orbits, MRA/MRV head PENDING repeat L-spine/sacral plexus MRI as well, though lower priority than brain imaging - PENDING  Impression: 67 yo F with Right third nerve palsy and Left greater than Right leg weakness in the setting of B-Cell lymphoma. Unclear if LE weakness could be polyradicular vs plexopathy. Especially with the elevated protein, concern for lymphomatous CNS involvement is significantly higher. Unfortunately, patient at this time is unable to tolerate MRI testing and/or CT imaging with contrast.  Recommendations: 1) MRI/MRA/MRV of the brain when able to obtain images 2) MRI of the sacral plexus/L-spine when able to obtain images 3) follow-up cytology 4) Continue PT/OT therapies, Patient likely deconditioned at this time.  Neurology to sign off at this time. Please call if able to obtain MRI images for review. Thank you for this consultation.  Hurbert Duran Sella, ANP-C [facility] Team  If 7pm- 7am, please page neurology on call as listed in [place].  04/06/2017, 8:25 AM

## 2017-04-06 NOTE — Progress Notes (Signed)
Patient stated she would call for NIV when she was ready, no distress noted at this time. RCP will be on standby to place when ready.

## 2017-04-06 NOTE — Care Management Note (Signed)
Case Management Note  Patient Details  Name: Jo Collins MRN: [id number] Date of Birth: [birth year]  Subjective/Objective:   Pt admitted with lower lobe PNA and AFib              Action/Plan:    PTA from home.  Pt remains critically ill; requiring BIPAP, IV Cardizem and Amiodarone (currently maxed out).  SNF recommended - CSW following   Expected Discharge Date:                  Expected Discharge Plan:  [place]  In-House Referral:  Clinical Social Work  Discharge planning Services  CM Consult  Post Acute Care Choice:    Choice offered to:     DME Arranged:    DME Agency:     HH Arranged:    HH Agency:     Status of Service:     If discussed at [employer], dates discussed:    Additional Comments:  Maryclare Labrador, RN 04/06/2017, 3:31 PM

## 2017-04-06 NOTE — Progress Notes (Signed)
Daily Progress Note   Patient Name: Jo Collins       Date: 04/06/2017 DOB: October 25, 1950  Age: 67 y.o. MRN#: [id number] Attending Physician: Jo Jansky, MD Primary Care Physician: Jo Pretty, MD Admit Date: 03/08/2017  Reason for Consultation/Follow-up: Establishing goals of care  Subjective: Patient confused.  She starts to answer my questions but is lethargic and unable to convey a complete thought. I spoke with husband, Jo Collins, out in the hallway (he does not want to discuss her situation in front of her).  Jo Collins had noticed that she is more confused and less interactive.    He states we are just taking it one day at a time.  We quickly reviewed her test results in the last 24 hours.  These are so very inconclusive.   At this point - if we do not know that Jo Collins has lymphoma in her brain causing her symptoms - Jo Collins wants to press on.  He feels that at this point there is nothing irreversible.  Dr. Irene Collins has explained to Jo Collins that if she has lymphoma in her brain there is nothing further we can do.  Jo Collins respects this.  Jo Collins is a DO NOT INTUBATE.  Jo Collins is clear he does not want her to die on a machine.  Patient Profile/HPI:  67 y.o. female  with past medical history of recurrent B cell lymphoma followed by Dr. Irene Collins, recurrent pleural effusion, obesity, chronic low back pain who was admitted on 03/05/2017 with leg weakness, ptosis, sepsis.  Initial work up revealed HCAP, afib with RVR, and acute kidney injury. Her respiratory status declined and she required Bipap.  Her pulse has been difficult to control.  She is tachycardic in the 130s today despite amio and diltiazem drips.  Her kidney function has declined over the past 6 weeks from a baseline of 0.9 on 2/20 to 2.5. Recent imaging  of her chest abdomen and pelvis indicates progression of the lymphoma.  There is concern that she may have brain metastases.  Length of Stay: 7  Current Medications: Scheduled Meds:  . allopurinol  100 mg Oral BID  . Chlorhexidine Gluconate Cloth  6 each Topical Daily  . [START ON 04/07/2017] digoxin  0.125 mg Intravenous Daily  . feeding supplement  1 Container Oral BID BM  . furosemide  80  mg Intravenous Q12H  . levalbuterol  0.63 mg Nebulization BID  . methylPREDNISolone (SOLU-MEDROL) injection  60 mg Intravenous Q8H  . metoprolol tartrate  25 mg Oral Q6H  . senna  2 tablet Oral QHS  . sodium chloride flush  10-40 mL Intracatheter Q12H  . sodium chloride flush  3 mL Intravenous Q12H    Continuous Infusions: . amiodarone 30 mg/hr (04/05/17 1900)  . ceFEPime (MAXIPIME) IV 1 g (04/05/17 2130)  . diltiazem (CARDIZEM) infusion 10 mg/hr (04/05/17 2252)  . heparin 1,300 Units/hr (04/05/17 1900)  . potassium PHOSPHATE IVPB (mmol) 30 mmol (04/06/17 1412)  .  sodium bicarbonate (isotonic) infusion in sterile water 125 mL/hr at 04/05/17 1900    PRN Meds: acetaminophen, fentaNYL (SUBLIMAZE) injection, levalbuterol, ondansetron (ZOFRAN) IV, sodium chloride flush, sodium chloride flush, traZODone  Physical Exam       Well developed obese female, awake, confused, lethargic CV tachy Resp no distress Abdomen soft, nt Extremities 2+ edema. Able to move all 4  Vital Signs: BP 110/64   Pulse (!) 137   Temp 97.9 F (36.6 C) (Oral)   Resp 15   Ht 5\' 1"  (1.549 m)   Wt 124.8 kg (275 lb 2.2 oz)   SpO2 99%   BMI 51.99 kg/m  SpO2: SpO2: 99 % O2 Device: O2 Device: Room Air O2 Flow Rate: O2 Flow Rate (L/min): (S) 1 L/min  Intake/output summary:   Intake/Output Summary (Last 24 hours) at 04/06/2017 1551 Last data filed at 04/06/2017 1527 Gross per 24 hour  Intake 284.7 ml  Output 3825 ml  Net -3540.3 ml   LBM: Last BM Date: 03/21/17 Baseline Weight: Weight: 115.5 kg (254 lb 10.1  oz) Most recent weight: Weight: 124.8 kg (275 lb 2.2 oz)       Palliative Assessment/Data: 20%      Patient Active Problem List   Diagnosis Date Noted  . Palliative care encounter   . DNI (do not intubate)   . Counseling regarding advance care planning and goals of care   . Hyperuricemia   . Acute respiratory failure with hypoxia ([place])   . AKI (acute kidney injury) ([place])   . 3rd nerve palsy, complete, right   . HCAP (healthcare-associated pneumonia)   . Atrial flutter ([place]) 03/03/2017  . Sepsis ([place]) 03/24/2017  . Leukocytosis 03/27/2017  . Acute on chronic renal failure ([place]) 03/29/2017  . 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 Foot pain, left   . Pancytopenia ([place]) 03/16/2017  . ARF (acute renal failure) ([place]) 03/16/2017  . Thrombocytopenia ([place]) 02/09/2017  . Left-sided low back pain with left-sided sciatica   . Abnormal CT of the abdomen   . Positive blood culture 02/07/2017  . Numbness 02/05/2017  . Atrial fibrillation with tachycardic ventricular rate ([place]) 01/08/2017  . LVH (left ventricular hypertrophy) 01/08/2017  . S/P thoracentesis   . Cough   . Neutrophilic leukocytosis 81/19/1478  . Pleural effusion 10/08/2016  . SOB (shortness of breath) 09/26/2016  . Acute respiratory distress 09/25/2016  . Chronic low back pain 09/25/2016  . Hypokalemia 09/25/2016  . Recurrent right pleural effusion 09/25/2016  . Diffuse large B cell lymphoma ([place]) 08/17/2016  . Acute midline thoracic back pain   . Lymphadenopathy   . Morbid obesity with BMI of 50.0-59.9, adult ([place])   . NHL (non-Hodgkin's lymphoma) ([place])   . Abdominal pain, RUQ   . Pleural effusion on right 08/07/2016  . Abnormal EKG 08/07/2016  . 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  Anemia 08/07/2016  . GERD (gastroesophageal reflux disease) 08/07/2016  . Shortness of breath 08/07/2016    Palliative Care Plan    Recommendations/Plan:  Family requests full scope treatment - but they  do not want intubation and they do not want a feeding tube.  Presented Jo Collins with a hard choices book and asked him to review it  Jo Collins requests that all providers discuss Jo Collins's health outside the room  Palliative will follow up again on Monday.  Please call [phone] if we are needed sooner.  Goals of Care and Additional Recommendations:  Limitations on Scope of Treatment: Full Scope Treatment  Code Status:  Limited code - DNI  Prognosis:   Unable to determine.  At her current rate of decline Kenna may only have days to weeks.    Discharge Planning:  To Be Determined.  Hospital death would not be a surprise.  Care plan was discussed with bedside RN.  Thank you for allowing the Palliative Medicine Team to assist in the care of this patient.  Total time spent:  15 min.     Greater than 50%  of this time was spent counseling and coordinating care related to the above assessment and plan.  Florentina Jenny, PA-C Palliative Medicine  Please contact Palliative MedicineTeam phone at [phone] for questions and concerns between 7 am - 7 pm.   Please see AMION for individual provider pager numbers.

## 2017-04-06 NOTE — Progress Notes (Signed)
CRITICAL VALUE ALERT  Critical Value:  Calcium 6.3  Date & Time Notied:  04/06/2017 1415  Provider Notified: Dr. Algis Liming and Dr. Marval Regal  Orders Received/Actions taken: Replacement ordered

## 2017-04-06 NOTE — Evaluation (Signed)
Occupational Therapy Evaluation Patient Details Name: Jo Collins MRN: [id number] DOB: 1950-11-04 Today's Date: 04/06/2017    History of Present Illness Pt is a 67 y.o. 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 Recent imaging indicates progression of lymphoma; concern for brain metastases. Head MRI shows no acute intracranial process or definite intracranial lymphoma; further brain and spine imaging limited secondary to pt unable to tolerate MRI (cannot do under anesthesia due to respiratory status). Other PMH includes non-Hodgkin's lymphoma, lymphadenopathy, h/o chemo.   Clinical Impression   Pt was assisted for ADL and IADL prior to this hospitalization and walked with a walker. Pt presents with significant weakness, lethargy with slow processing and response speed. Pt rolled with moderate assist and was lifted to chair with maximove. Pt pleased to be OOB. VS monitored throughout and mobility tolerated well. Pt with coughing with eating, per husband she has been doing this for some time, RN made aware pt needs ST consult. Will follow acutely.    Follow Up Recommendations  SNF;Supervision/Assistance - 24 hour    Equipment Recommendations       Recommendations for Other Services       Precautions / Restrictions Precautions Precautions: Fall Precaution Comments: Tachycardic, L foot drop Restrictions Weight Bearing Restrictions: No      Mobility Bed Mobility Overal bed mobility: Needs Assistance Bed Mobility: Rolling Rolling: Mod assist         General bed mobility comments: mod assist and use of rail to place maximove pad  Transfers Overall transfer level: Needs assistance               General transfer comment: Bed to chair with maximove    Balance Overall balance assessment: Needs assistance Sitting-balance  support: Bilateral upper extremity supported Sitting balance-Leahy Scale: Poor Sitting balance - Comments: Reliant on BUE support in chair                                   ADL either performed or assessed with clinical judgement   ADL                                         General ADL Comments: can self feed with set up, coughing noted when eating--RN notified, pt otherwise dependent     Vision Patient Visual Report: No change from baseline Additional Comments: pt with eyes closed much of session     Perception     Praxis      Pertinent Vitals/Pain Pain Assessment: Faces Faces Pain Scale: Hurts a little bit Pain Location: Generalized Pain Descriptors / Indicators: Discomfort Pain Intervention(s): Monitored during session     Hand Dominance Right   Extremity/Trunk Assessment Upper Extremity Assessment Upper Extremity Assessment: Generalized weakness   Lower Extremity Assessment Lower Extremity Assessment: Defer to PT evaluation RLE Deficits / Details: Hip flex 2/5, knee flex/ext 3/5, ankle 3/5 LLE Deficits / Details: L hip flex 2/5, knee flex/ext 3-/5, ankle DF 1/5, ankle PF 2/5   Cervical / Trunk Assessment Cervical / Trunk Assessment: Other exceptions(obesity)   Communication Communication Communication: Expressive difficulties   Cognition Arousal/Alertness: Lethargic Behavior During Therapy: Flat affect Overall Cognitive Status: Impaired/Different from baseline Area of  Impairment: Following commands;Problem solving;Attention;Safety/judgement;Awareness                   Current Attention Level: Selective   Following Commands: Follows one step commands with increased time Safety/Judgement: Decreased awareness of deficits Awareness: Emergent Problem Solving: Slow processing;Decreased initiation;Requires verbal cues General Comments: Pt seems to have slowed processing; delayed responses or not responding to some  questions without max cues   General Comments  Husband present throughout session; seems very supportive    Exercises     Shoulder Instructions      Home Living Family/patient expects to be discharged to:: Skilled nursing facility Living Arrangements: Spouse/significant other Available Help at Discharge: Family;Available 24 hours/day Type of Home: House Home Access: Level entry     Home Layout: One level     Bathroom Shower/Tub: Tub/shower unit         Home Equipment: Environmental consultant - 2 wheels;Cane - single point          Prior Functioning/Environment Level of Independence: Needs assistance  Gait / Transfers Assistance Needed: PTA, pt able to amb with RW. Husband assists with transfers into shower (although 1-2 days PTA, could no longer do this due to weakness). Pt can use bathroom, but requires assist from husband to stand from toilet ADL's / Homemaking Assistance Needed: Husband assists with all household tasks (cooking, cleaning)            OT Problem List: Decreased strength;Decreased activity tolerance;Impaired balance (sitting and/or standing);Decreased knowledge of use of DME or AE;Decreased cognition;Decreased coordination;Obesity;Pain;Impaired UE functional use      OT Treatment/Interventions: Self-care/ADL training;DME and/or AE instruction;Therapeutic activities;Patient/family education;Balance training    OT Goals(Current goals can be found in the care plan section) Acute Rehab OT Goals Patient Stated Goal: Get back home and to walking OT Goal Formulation: With patient Time For Goal Achievement: 04/20/17 Potential to Achieve Goals: Fair ADL Goals Pt Will Perform Eating: Independently;sitting Pt Will Perform Grooming: with min assist;sitting Pt/caregiver will Perform Home Exercise Program: Increased strength;Both right and left upper extremity;With minimal assist(AROM) Additional ADL Goal #1: Pt will perform bed mobility with min assist in preparation for  ADL. Additional ADL Goal #2: Pt will perform sit to stand with +2 mod assist as a precursor to ADL.  OT Frequency: Min 2X/week   Barriers to D/C:            Co-evaluation PT/OT/SLP Co-Evaluation/Treatment: Yes Reason for Co-Treatment: For patient/therapist safety PT goals addressed during session: Mobility/safety with mobility OT goals addressed during session: Strengthening/ROM      AM-PAC PT "6 Clicks" Daily Activity     Outcome Measure Help from another person eating meals?: A Little Help from another person taking care of personal grooming?: Total Help from another person toileting, which includes using toliet, bedpan, or urinal?: Total Help from another person bathing (including washing, rinsing, drying)?: Total Help from another person to put on and taking off regular upper body clothing?: A Lot Help from another person to put on and taking off regular lower body clothing?: Total 6 Click Score: 9   End of Session    Activity Tolerance: Patient tolerated treatment well Patient left: in chair;with call bell/phone within reach;with family/visitor present  OT Visit Diagnosis: Muscle weakness (generalized) (M62.81);Pain;Cognitive communication deficit (R41.841)                Time: 6301-6010 OT Time Calculation (min): 36 min Charges:  OT General Charges $OT Visit: 1 Visit OT Evaluation $OT Eval Moderate  Complexity: 1 Mod G-Codes:     04-11-2017 Nestor Lewandowsky, OTR/L Pager: [phone]  Werner Lean Haze Boyden Apr 11, 2017, 12:22 PM

## 2017-04-06 NOTE — Care Management Important Message (Signed)
Important Message  Patient Details  Name: Jo Collins MRN: [id number] Date of Birth: Jul 01, 1950   Medicare Important Message Given:  Yes    Barb Merino Somtochukwu Woollard 04/06/2017, 3:23 PM

## 2017-04-06 NOTE — Evaluation (Addendum)
Physical Therapy Evaluation Patient Details Name: Jo Collins MRN: [id number] DOB: 1950-04-26 Today's Date: 04/06/2017   History of Present Illness  Pt is a 67 y.o. 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 Recent imaging indicates progression of lymphoma; concern for brain metastases. Head MRI shows no acute intracranial process or definite intracranial lymphoma; further brain and spine imaging limited secondary to pt unable to tolerate MRI (cannot do under anesthesia due to respiratory status). Other PMH includes non-Hodgkin's lymphoma, lymphadenopathy, h/o chemo.    Clinical Impression  Pt presents with an overall decrease in functional mobility secondary to above. PTA, pt lives at home with husband; ambulatory with RW and requires assist for ADLs and household tasks. Pt reports has not been OOB since admission on 3/29. Demonstrates significant LE weakness (L>R) and slowed processing. ModA+2 for bed mobility. Transfer to recliner with maximove left (RN aware). Pt would benefit from continued acute PT services to maximize functional mobility and independence prior to d/c with SNF-level therapies.   HR up to 149 during session; SpO2 >98% on RA Resting BP 102/75 Post-transfer BP 118/98     Follow Up Recommendations SNF;Supervision/Assistance - 24 hour    Equipment Recommendations  (TBD next venue)    Recommendations for Other Services       Precautions / Restrictions Precautions Precautions: Fall Precaution Comments: Tachycardic, L foot drop Restrictions Weight Bearing Restrictions: No      Mobility  Bed Mobility Overal bed mobility: Needs Assistance Bed Mobility: Rolling Rolling: Mod assist;+2 for physical assistance         General bed mobility comments: Rolled R/L with modA+2 for maximove pad placement. Once seated in  recliner, able to scoot/reposition hips with use of BUEs and modA  Transfers Overall transfer level: Needs assistance               General transfer comment: Bed to chair with maximove  Ambulation/Gait                Stairs            Wheelchair Mobility    Modified Rankin (Stroke Patients Only)       Balance Overall balance assessment: Needs assistance Sitting-balance support: Bilateral upper extremity supported Sitting balance-Leahy Scale: Poor Sitting balance - Comments: Reliant on BUE support in chair                                     Pertinent Vitals/Pain Pain Assessment: Faces Faces Pain Scale: Hurts a little bit Pain Location: Generalized Pain Descriptors / Indicators: Discomfort Pain Intervention(s): Monitored during session    Home Living Family/patient expects to be discharged to:: Skilled nursing facility Living Arrangements: Spouse/significant other Available Help at Discharge: Family;Available 24 hours/day Type of Home: House Home Access: Level entry     Home Layout: One level Home Equipment: Walker - 2 wheels;Cane - single point      Prior Function Level of Independence: Needs assistance   Gait / Transfers Assistance Needed: PTA, pt able to amb with RW. Husband assists with transfers into shower (although 1-2 days PTA, could no longer do this due to weakness). Pt can use bathroom, but requires assist from husband to stand from toilet  ADL's / Homemaking Assistance Needed: Husband assists with all household tasks (  cooking, cleaning)        Hand Dominance        Extremity/Trunk Assessment   Upper Extremity Assessment Upper Extremity Assessment: Generalized weakness(AROM WFL)    Lower Extremity Assessment Lower Extremity Assessment: RLE deficits/detail;LLE deficits/detail RLE Deficits / Details: Hip flex 2/5, knee flex/ext 3/5, ankle 3/5 LLE Deficits / Details: L hip flex 2/5, knee flex/ext 3-/5, ankle DF  1/5, ankle PF 2/5       Communication   Communication: No difficulties  Cognition Arousal/Alertness: Awake/alert Behavior During Therapy: Flat affect Overall Cognitive Status: Impaired/Different from baseline Area of Impairment: Following commands;Problem solving;Attention;Safety/judgement;Awareness                   Current Attention Level: Selective   Following Commands: Follows one step commands with increased time Safety/Judgement: Decreased awareness of deficits Awareness: Emergent Problem Solving: Slow processing;Decreased initiation;Requires verbal cues General Comments: Pt seems to have slowed processing; delayed responses or not responding to some questions without max cues      General Comments General comments (skin integrity, edema, etc.): Husband present throughout session; seems very supportive    Exercises     Assessment/Plan    PT Assessment Patient needs continued PT services  PT Problem List Decreased strength;Decreased activity tolerance;Decreased balance;Decreased mobility;Decreased cognition;Decreased knowledge of use of DME       PT Treatment Interventions DME instruction;Gait training;Functional mobility training;Therapeutic activities;Therapeutic exercise;Balance training;Patient/family education;Wheelchair mobility training    PT Goals (Current goals can be found in the Care Plan section)  Acute Rehab PT Goals Patient Stated Goal: Get back home and to walking PT Goal Formulation: With patient Time For Goal Achievement: 04/20/17 Potential to Achieve Goals: Fair    Frequency Min 2X/week   Barriers to discharge        Co-evaluation PT/OT/SLP Co-Evaluation/Treatment: Yes Reason for Co-Treatment: Complexity of the patient's impairments (multi-system involvement);For patient/therapist safety;To address functional/ADL transfers PT goals addressed during session: Mobility/safety with mobility         AM-PAC PT "6 Clicks" Daily Activity   Outcome Measure Difficulty turning over in bed (including adjusting bedclothes, sheets and blankets)?: Unable Difficulty moving from lying on back to sitting on the side of the bed? : Unable Difficulty sitting down on and standing up from a chair with arms (e.g., wheelchair, bedside commode, etc,.)?: Unable Help needed moving to and from a bed to chair (including a wheelchair)?: Total Help needed walking in hospital room?: Total Help needed climbing 3-5 steps with a railing? : Total 6 Click Score: 6    End of Session Equipment Utilized During Treatment: Gait belt Activity Tolerance: Patient tolerated treatment well;Patient limited by fatigue Patient left: in chair;with call bell/phone within reach;with family/visitor present Nurse Communication: Mobility status;Need for lift equipment;Other (comment)(potential need for swallow eval) PT Visit Diagnosis: Other abnormalities of gait and mobility (R26.89);Muscle weakness (generalized) (M62.81)    Time: 4854-6270 PT Time Calculation (min) (ACUTE ONLY): 36 min   Charges:   PT Evaluation $PT Eval High Complexity: 1 High     PT G Codes:       Mabeline Caras, PT, DPT Acute Rehab Services  Pager: [place] 04/06/2017, 9:50 AM

## 2017-04-06 NOTE — Progress Notes (Signed)
ANTICOAGULATION CONSULT NOTE - Follow Up Consult  Pharmacy Consult for heparin Indication: atrial fibrillation  No Known Allergies  Patient Measurements: Height: 5\' 1"  (154.9 cm) Weight: 275 lb 2.2 oz (124.8 kg) IBW/kg (Calculated) : 47.8 Heparin Dosing Weight: 76 kg  Vital Signs: Temp: 97.9 F (36.6 C) (04/05 0745) Temp Source: Oral (04/05 0745) BP: 109/71 (04/05 0745) Pulse Rate: 137 (04/05 0745)  Labs: Recent Labs    04/04/17 0500  04/04/17 1620  04/05/17 0733 04/05/17 1656 04/05/17 2129 04/06/17 0649  HGB  --    < > 7.9*  --  8.1*  --   --  7.7*  HCT  --    < > 22.5*  --  23.0*  --   --  22.2*  PLT  --    < > 83*  --  96*  --   --  99*  HEPARINUNFRC 0.27*  --   --   --  0.53  --   --  0.62  CREATININE  --    < > 2.57*  2.57*   < > 2.55* 2.60* 2.60*  --    < > = values in this interval not displayed.    Estimated Creatinine Clearance: 26.4 mL/min (A) (by C-G formula based on SCr of 2.6 mg/dL (H)).   Assessment: Patient is a 67 y.o. F with hx lymphoma currently undergoing chemotherapy treatment presented to the ED on 03/11/2017 with afib with RVR s/p s/p DCCV on 4/1 but converted back to afib. She's currently on heparin drip. SOB has worsened and she was on bipap for a short period.  Palliative care consulted for goals of care -Heparin level is at goal on 1300 units/hr -plt= 99 ( lymphoma with recent chemo)   Goal of Therapy:  Heparin level 0.3-0.7 units/ml Monitor platelets by anticoagulation protocol: Yes   Plan -No heparin changes needed - daily heparin level and CBC  Uvaldo Rising, BCPS  Clinical Pharmacist Pager [phone]  04/06/2017 8:02 AM

## 2017-04-06 NOTE — Progress Notes (Signed)
PULMONARY / CRITICAL CARE MEDICINE   Name: Jo Collins MRN: [id number] DOB: 04-20-1950    ADMISSION DATE:  03/26/2017 CONSULTATION DATE:  04/04/2017   REFERRING MD:  Starla Link  CHIEF COMPLAINT: shortness of breath   Interval Hx: Patient overall looks slightly better today. She is off the BIPAP and on 1 litre nasal canula. Remains on NaHCO3 drip at 65m/hr and diuresing well. [place]oreadid not show any significant pleural effusion. Still in aflutter/fib with RVR despite amio and dilt drips.    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 Patient respiratory  Status has been getting worse over the day and was put on BIPAP for increased work of breathing. Patient denies any cough, fever or chest pain.   Patient complains of seeing double and cant open her right eye. complain of pain in the left leg.     VITAL SIGNS: BP 109/71 (BP Location: Right Leg)   Pulse (!) 137   Temp 97.9 F (36.6 C) (Oral)   Resp 11   Ht _0  (1.549 m)   Wt 124.8 kg (275 lb 2.2 oz)   SpO2 99%   BMI 51.99 kg/m   HEMODYNAMICS:    VENTILATOR SETTINGS:    Currently on room air INTAKE / OUTPUT: I/O last 3 completed shifts: In: 164.7 [I.V.:164.7] Out: 3650 [Urine:3650]  PHYSICAL EXAMINATION: General: Morbidly obese female on room air no acute distress at rest HEENT: Short neck, no lymphadenopathy appreciated PSY: Pleasant affect Neuro: Moves all extremities x4, reports left and right lower extremity weakness. CV: Heart sounds are irregular with a ventricular rate of 108 PULM: Decreased breath sounds throughout GWU:JWJX non-tender, bsx4 active  Extremities: warm/dry, plus edema  Skin: no rashes or lesions   LABS:  BMET Recent Labs  Lab 04/05/17 1656 04/05/17 2129 04/06/17 0649  NA 142 140 141   K 3.2* 3.3* 3.0*  CL 110 108 103  CO2 18* 19* 21*  BUN 40* 41* 42*  CREATININE 2.60* 2.60* 2.58*  GLUCOSE 168* 176* 156*    Electrolytes Recent Labs  Lab 04/01/17 0430  04/04/17 0917 04/04/17 1620  04/05/17 0733 04/05/17 1656 04/05/17 2129 04/06/17 0649  CALCIUM 8.6*   < > 8.0* 7.8*  7.8*   < > 7.6* 7.4* 7.1* 6.7*  MG 2.0  --  1.7  --   --   --   --   --  1.5*  PHOS  --   --   --  <1.0*  --  1.0*  --   --  1.7*   < > = values in this interval not displayed.    CBC Recent Labs  Lab 04/04/17 1620 04/05/17 0733 04/06/17 0649  WBC 38.9* 47.3* 46.5*  HGB 7.9* 8.1* 7.7*  HCT 22.5* 23.0* 22.2*  PLT 83* 96* 99*    Coag's No results for input(s): APTT, INR in the last 168 hours.  Sepsis Markers Recent Labs  Lab 04/01/17 0850 04/01/17 1541 04/04/17 2140  LATICACIDVEN 2.4* 1.9 1.9    ABG Recent Labs  Lab 04/04/17 0841  PHART 7.333*  PCO2ART 26.0*  PO2ART 97.4    Liver Enzymes Recent Labs  Lab 03/06/2017 1309 03/31/17 0513 04/04/17 0917 04/04/17 1620 04/05/17 0733 04/06/17 0649  AST 47* 51* 47*  --   --   --  ALT _0 --   --   --   ALKPHOS 105 96 101  --   --   --   BILITOT 1.1 0.8 0.8  --   --   --   ALBUMIN 2.8* 2.4* 2.4* 2.4* 2.4* 2.3*    Cardiac Enzymes No results for input(s): TROPONINI, PROBNP in the last 168 hours.  Glucose No results for input(s): GLUCAP in the last 168 hours.  Imaging Mr Brain Wo Contrast  Result Date: 04/05/2017 CLINICAL DATA:  Recurrent B-cell lymphoma, assess for brain metastasis. EXAM: MRI HEAD WITHOUT CONTRAST TECHNIQUE: Axial diffusion weighted imaging, axial T2 FLAIR, axial T2 and axial T2 SWAN on a 3 tesla scanner. Patient was unable to tolerate further imaging. COMPARISON:  CT HEAD March 30, 2017 and MRI of the head February 07, 2016 FINDINGS: Sequences vary from moderate to severely motion degraded. INTRACRANIAL CONTENTS: No reduced diffusion to suggest acute ischemia or hypercellular tumor. No  susceptibility artifact to suggest lobar hemorrhage though motion limits sensitivity for micro hemorrhages. The ventricles and sulci are normal for patient's age. Stable patchy supratentorial white matter FLAIR T2 hyperintensities. No suspicious parenchymal signal, definite masses, mass effect. No abnormal extra-axial fluid collections. No extra-axial masses. VASCULAR: Normal major intracranial vascular flow voids present at skull base. SKULL AND UPPER CERVICAL SPINE: Empty sella better demonstrated on prior MRI. No suspicious calvarial bone marrow signal. Craniocervical junction maintained. SINUSES/ORBITS: The mastoid air-cells and included paranasal sinuses are well-aerated.The included ocular globes and orbital contents are non-suspicious. OTHER: None. IMPRESSION: 1. Limited noncontrast motion degraded 4 sequence MRI of the head. 2. No acute intracranial process or definite intracranial lymphoma though contrast enhanced sequences are more sensitive. 3. Stable mild-to-moderate chronic small vessel ischemic disease. 4. Stable empty sella. Electronically Signed   By: Elon Alas M.D.   On: 04/05/2017 16:16   Dg Chest Port 1 View  Result Date: 04/06/2017 CLINICAL DATA:  Respiratory failure.  Shortness of breath. EXAM: PORTABLE CHEST 1 VIEW COMPARISON:  04/05/2017; 04/04/2017; 10/10/2026 chest CT-02/06/2017 FINDINGS: Grossly unchanged cardiac silhouette and mediastinal contours. Stable positioning of support apparatus. Improved aeration of lung bases with persistent right heterogeneous/consolidative opacities and suspected trace right-sided effusion. No new focal airspace opacities. No pneumothorax. No evidence of edema. No acute osseus abnormalities. IMPRESSION: Improved aeration the lung bases with persistent right basilar opacities and suspected trace right-sided effusion, similar to the 10/2016 examination and thus favored to represent atelectasis or scar Electronically Signed   By: Sandi Mariscal M.D.   On:  04/06/2017 09:30      ANTIBIOTICS: Cefepime day 2 of X>>   SIGNIFICANT EVENTS: 04/06/2017 reports feeling better    DISCUSSION: Mrs Jo Collins a 67 yr old lady with recurrent large B cell lymphoma coming in with acute renal failure severe metabolic acidosis, acute hypoxemic respiratory failure requiring non invasive mechanical ventilation, pulmonary edema and right pleural effusion with right chest mass. Patient is also in atrial flutter with RVR. 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   ASSESSMENT / PLAN:  PULMONARY A: Acute hypoxemic resp failure requiring on/off non invasive mechanical ventilation for increased work of breathing Pulmonary edema Right pleural effusion  Right chest wall mass recurrent lymphoma  P:   -Currently on room air with no acute distress.  Reports being better with diuresis. -Currently on diuresis per nephrology -Pleural effusions not adequate fluid amount for thoracentesis -Treat her acidosis with bicarbonate, no recent arterial blood gases, CO2 on be met is 21 -If  she decompensates she may need a intubation but does not appear to be in acute respiratory distress currently on 04/06/2017  CARDIOVASCULAR A:  Atrial flutter with RVR Acute pulm edema Acute diastolic heart failure  Intake/Output Summary (Last 24 hours) at 04/06/2017 1036 Last data filed at 04/06/2017 0915 Gross per 24 hour  Intake 284.7 ml  Output 3450 ml  Net -3165.3 ml    P:  - diurese as tolerated, note -3 L over last 24 hours -Amiodarone, digoxin if potassium improved, diltiazem, Lopressor per cardiology  RENAL A:   Acute renal failure  Severe metabolic acidosis  Suspected  Tumor lysis syndrome  Lab Results  Component Value Date   CREATININE 2.58 (H) 04/06/2017   CREATININE 2.60 (H) 04/05/2017   CREATININE 2.60 (H) 04/05/2017   CREATININE 2.48 (H) 03/13/2017   CREATININE 1.09 02/26/2017   CREATININE 0.85 02/14/2017   CREATININE 0.7 12/07/2016   CREATININE  0.8 11/16/2016   CREATININE 0.8 10/25/2016   Recent Labs  Lab 04/05/17 1656 04/05/17 2129 04/06/17 0649  K 3.2* 3.3* 3.0*   Recent Labs  Lab 04/05/17 1656 04/05/17 2129 04/06/17 0649  NA 142 140 141  ' P:   - Appreciate nephrology input -Continue current interventions as per nephrology -Potassium repleted  GASTROINTESTINAL A:    P:   Heart healthy diet  HEMATOLOGIC A:   Recurrent large B cell lymphoma to right chest wall and pelvic lymph nodes   P:  Note input from hematology oncology   INFECTIOUS A:   Health care associated pneumonia  P:   Cefepime day 2 of X no positive culture data  ENDOCRINE CBG (last 3)    A:   No acute issues  P:     NEUROLOGIC A:   Suspected CNS recurrence Right ptosis  Leg weakness   P:   Neurology is following She is unable to complete MRI due to pain She now has left and right-sided lower extremity weakness    FAMILY  - Updates: 04/06/2017 patient has been updated at bedside  - Inter-disciplinary family meet or Palliative Care meeting due by: 04/11/2017    Richardson Landry Srihan Brutus ACNP Maryanna Shape PCCM Pager [phone] till 1 pm If no answer page [phone] 04/06/2017, 10:27 AM

## 2017-04-07 ENCOUNTER — Inpatient Hospital Stay (HOSPITAL_COMMUNITY): Payer: Medicare Other

## 2017-04-07 ENCOUNTER — Ambulatory Visit: Payer: Medicare Other

## 2017-04-07 LAB — BASIC METABOLIC PANEL
ANION GAP: 15 (ref 5–15)
BUN: 38 mg/dL — AB (ref 6–20)
CHLORIDE: 98 mmol/L — AB (ref 101–111)
CO2: 27 mmol/L (ref 22–32)
Calcium: 6.1 mg/dL — CL (ref 8.9–10.3)
Creatinine, Ser: 2.36 mg/dL — ABNORMAL HIGH (ref 0.44–1.00)
GFR calc Af Amer: 24 mL/min — ABNORMAL LOW (ref >60)
GFR, EST NON AFRICAN AMERICAN: 20 mL/min — AB (ref >60)
Glucose, Bld: 146 mg/dL — ABNORMAL HIGH (ref 65–99)
POTASSIUM: 3.2 mmol/L — AB (ref 3.5–5.1)
SODIUM: 140 mmol/L (ref 135–145)

## 2017-04-07 LAB — RENAL FUNCTION PANEL
ANION GAP: 14 (ref 5–15)
Albumin: 1.9 g/dL — ABNORMAL LOW (ref 3.5–5.0)
BUN: 36 mg/dL — ABNORMAL HIGH (ref 6–20)
CHLORIDE: 99 mmol/L — AB (ref 101–111)
CO2: 27 mmol/L (ref 22–32)
Calcium: 5.4 mg/dL — CL (ref 8.9–10.3)
Creatinine, Ser: 2.1 mg/dL — ABNORMAL HIGH (ref 0.44–1.00)
GFR, EST AFRICAN AMERICAN: 27 mL/min — AB (ref >60)
GFR, EST NON AFRICAN AMERICAN: 23 mL/min — AB (ref >60)
Glucose, Bld: 139 mg/dL — ABNORMAL HIGH (ref 65–99)
POTASSIUM: 2.5 mmol/L — AB (ref 3.5–5.1)
Phosphorus: 2.2 mg/dL — ABNORMAL LOW (ref 2.5–4.6)
Sodium: 140 mmol/L (ref 135–145)

## 2017-04-07 LAB — CBC
HCT: 20.4 % — ABNORMAL LOW (ref 36.0–46.0)
HEMATOCRIT: 19.3 % — AB (ref 36.0–46.0)
HEMOGLOBIN: 6.7 g/dL — AB (ref 12.0–15.0)
HEMOGLOBIN: 7.1 g/dL — AB (ref 12.0–15.0)
MCH: 27.8 pg (ref 26.0–34.0)
MCH: 28.1 pg (ref 26.0–34.0)
MCHC: 34.7 g/dL (ref 30.0–36.0)
MCHC: 34.8 g/dL (ref 30.0–36.0)
MCV: 80.1 fL (ref 78.0–100.0)
MCV: 80.6 fL (ref 78.0–100.0)
PLATELETS: 70 10*3/uL — AB (ref 150–400)
Platelets: 78 10*3/uL — ABNORMAL LOW (ref 150–400)
RBC: 2.41 MIL/uL — ABNORMAL LOW (ref 3.87–5.11)
RBC: 2.53 MIL/uL — AB (ref 3.87–5.11)
RDW: 20.5 % — AB (ref 11.5–15.5)
RDW: 20.7 % — ABNORMAL HIGH (ref 11.5–15.5)
WBC: 36.1 10*3/uL — ABNORMAL HIGH (ref 4.0–10.5)
WBC: 35.8 10*3/uL — ABNORMAL HIGH (ref 4.0–10.5)

## 2017-04-07 LAB — MAGNESIUM
MAGNESIUM: 1.4 mg/dL — AB (ref 1.7–2.4)
Magnesium: 1.2 mg/dL — ABNORMAL LOW (ref 1.7–2.4)

## 2017-04-07 LAB — PREPARE RBC (CROSSMATCH)

## 2017-04-07 LAB — ABO/RH: ABO/RH(D): A POS

## 2017-04-07 LAB — HEPARIN LEVEL (UNFRACTIONATED)
HEPARIN UNFRACTIONATED: 0.79 [IU]/mL — AB (ref 0.30–0.70)
Heparin Unfractionated: 0.79 IU/mL — ABNORMAL HIGH (ref 0.30–0.70)

## 2017-04-07 MED ORDER — POTASSIUM CHLORIDE CRYS ER 20 MEQ PO TBCR
40.0000 meq | EXTENDED_RELEASE_TABLET | ORAL | Status: DC
  Administered 2017-04-07: 40 meq via ORAL
  Filled 2017-04-07: qty 2

## 2017-04-07 MED ORDER — POTASSIUM CHLORIDE CRYS ER 20 MEQ PO TBCR
40.0000 meq | EXTENDED_RELEASE_TABLET | Freq: Two times a day (BID) | ORAL | Status: DC
  Administered 2017-04-07: 40 meq via ORAL
  Filled 2017-04-07: qty 2

## 2017-04-07 MED ORDER — MAGNESIUM SULFATE 2 GM/50ML IV SOLN
2.0000 g | Freq: Once | INTRAVENOUS | Status: AC
  Administered 2017-04-07: 2 g via INTRAVENOUS
  Filled 2017-04-07: qty 50

## 2017-04-07 MED ORDER — POTASSIUM PHOSPHATES 15 MMOLE/5ML IV SOLN
30.0000 mmol | Freq: Once | INTRAVENOUS | Status: AC
  Administered 2017-04-07: 30 mmol via INTRAVENOUS
  Filled 2017-04-07: qty 10

## 2017-04-07 MED ORDER — METHYLPREDNISOLONE SODIUM SUCC 40 MG IJ SOLR
40.0000 mg | Freq: Two times a day (BID) | INTRAMUSCULAR | Status: DC
  Administered 2017-04-07 – 2017-04-09 (×5): 40 mg via INTRAVENOUS
  Filled 2017-04-07 (×5): qty 1

## 2017-04-07 MED ORDER — SODIUM CHLORIDE 0.9 % IV SOLN: Freq: Once | INTRAVENOUS | Status: DC

## 2017-04-07 NOTE — Progress Notes (Signed)
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  Also with some pupillary involvement, the most fundamental workup of a new 3rd nerve palsy is for P-comm aneurysm and I still think that an MRA if able to be obtained would be beneficial.    I think that the lumbosacral plexopathy is much less likely to be anything other than lymphomatous involvement.  Paraneoplastic syndromes could be considered, but I think less likely than nerve sheath spread of the lymphoma.  In any case treatment would be her underlying malignancy.  Though she did not have any malignant cells found on cytology on her lumbar puncture, she did have elevated protein and I suspect there is some involvement, if it is felt that it would change management but I think really the only option for confirmation would be recurrent high-volume lumbar punctures for flow cytometry and cytology.  I would defer to her oncologist if this would change his management  at the current time.  Of note, she required lumbar puncture under fluoroscopy for her initial LP so I would favor that approach again if this were pursued.   Recommendations: 1) if MRA is obtained, this could help narrow the differential of her 3rd nerve palsy, however if unable to be obtained I do not think I would pursue any other imaging as I doubt she would be a very good intervention candidate even if she has an aneurysm causing her symptoms(it would be unruptured). 2) if CNS involvement is to continue to be investigated, that would be with recurrent lumbar punctures, it is possible that this is isolated nerve sheath involvement and so even recurrent lumbar punctures with normal cytology/flow cytometry would not rule out lymphoma as a cause of her plexopathy.  This will need to be done by IR and I believe cytology/flow cytometry is only done on weekdays. 3) ideally a contrasted study of her left lumbosacral plexus would be performed, but I do not feel that she would be capable of having this done safely without intubation and it is been indicated that this would have serious concerns as well. 4) no further recommendations at this time other than the above, I will follow peripherally in case her MRA is obtained, but otherwise will sign off for now.  Please call with further questions or concerns.   Roland Rack, MD [facility] [phone]  If 7pm- 7am, please page neurology on call as listed in [place].

## 2017-04-07 NOTE — Progress Notes (Addendum)
[place] for heparin, cefepime Indication: atrial fibrillation  No Known Allergies  Patient Measurements: Height: 5\' 1"  (154.9 cm) Weight: 275 lb 2.2 oz (124.8 kg) IBW/kg (Calculated) : 47.8 Heparin Dosing Weight: 76 kg  Vital Signs: Temp: 97.9 F (36.6 C) (04/06 1145) Temp Source: Oral (04/06 1100) BP: 95/56 (04/06 1200) Pulse Rate: 107 (04/06 1200)  Labs: Recent Labs    04/06/17 0649 04/06/17 1325 04/07/17 0236 04/07/17 0242 04/07/17 0540 04/07/17 1418  HGB 7.7*  --  6.7*  --  7.1*  --   HCT 22.2*  --  19.3*  --  20.4*  --   PLT 99*  --  78*  --  70*  --   HEPARINUNFRC 0.62  --   --  0.79*  --  0.79*  CREATININE 2.58* 2.49* 2.10*  --  2.36*  --      Assessment: Patient is a 67 y.o. F with hx lymphoma currently undergoing chemotherapy treatment presented to the ED on 03/31/2017 with afib with RVR s/p s/p DCCV on 4/1 but converted back to afib. She's currently on heparin drip.  -heparin level= 0.79 -plt= 70 ( lymphoma with recent chemo) -Hg= 6.7 ((3L positive in the last 24 hours)  She is also on Cefepime D9 for PNA. Per CCM note- consider duration of 10-14 days -WBC= 35.8 (on IV steroids), afeb, CrCl ~ 30  Goal of Therapy:  Heparin level 0.3-0.7 units/ml Monitor platelets by anticoagulation protocol: Yes   Plan - Reduce heparin to 1000 units/hr - Daily heparin level and CBC - Check 8 hour level -No Cefepime dose changes needed -Consider a total duration of 10 days for cefepime?  Hildred Laser, PharmD Clinical Pharmacist Clinical phone from 8:30-4:00 is [phone] After 4pm, please call Main [facility] [phone]) for assistance. 04/07/2017 3:29 PM

## 2017-04-07 NOTE — Progress Notes (Signed)
S: No complaints today O:BP 90/71   Pulse (!) 114   Temp 98.8 F (37.1 C) (Oral)   Resp 10   Ht 5\' 1"  (1.549 m)   Wt 124.8 kg (275 lb 2.2 oz)   SpO2 100%   BMI 51.99 kg/m   Intake/Output Summary (Last 24 hours) at 04/07/2017 1104 Last data filed at 04/07/2017 0900 Gross per 24 hour  Intake 7166.18 ml  Output 2350 ml  Net 4816.18 ml   Intake/Output: I/O last 3 completed shifts: In: 5519.3 [P.O.:440; I.V.:4779.3; IV Piggyback:300] Out: 2094 [Urine:3825]  Intake/Output this shift:  Total I/O In: 1766.9 [P.O.:120; I.V.:1331.9; Blood:315] Out: 1800 [Urine:1800] Weight change:  BSJ:GGEZMOQHUTM ill-appearing, NAD, resting comfortably LYY:TKPTW, IRR IRR Resp: decreased inspiratory effort, bilateral rhonchi SFK:CLEXN, +BS, soft Ext: 1+ edema on left, trace on right  Recent Labs  Lab 04/04/17 0917 04/04/17 1620  04/05/17 0733 04/05/17 1656 04/05/17 2129 04/06/17 0649 04/06/17 1325 04/07/17 0236 04/07/17 0540  NA 140 138  138   < > 141 142 140 141 137 140 140  K 3.8 4.0  4.0   < > 3.6 3.2* 3.3* 3.0* 3.1* 2.5* 3.2*  CL 116* 114*  114*   < > 112* 110 108 103 103 99* 98*  CO2 13* 13*  13*   < > 16* 18* 19* 21* 22 27 27   GLUCOSE 94 136*  137*   < > 153* 168* 176* 156* 160* 139* 146*  BUN 30* 32*  33*   < > 38* 40* 41* 42* 42* 36* 38*  CREATININE 2.41* 2.57*  2.57*   < > 2.55* 2.60* 2.60* 2.58* 2.49* 2.10* 2.36*  ALBUMIN 2.4* 2.4*  --  2.4*  --   --  2.3*  --  1.9*  --   CALCIUM 8.0* 7.8*  7.8*   < > 7.6* 7.4* 7.1* 6.7* 6.3* 5.4* 6.1*  PHOS  --  <1.0*  --  1.0*  --   --  1.7*  --  2.2*  --   AST 47*  --   --   --   --   --   --   --   --   --   ALT 23  --   --   --   --   --   --   --   --   --    < > = values in this interval not displayed.   Liver Function Tests: Recent Labs  Lab 04/04/17 0917  04/05/17 0733 04/06/17 0649 04/07/17 0236  AST 47*  --   --   --   --   ALT 23  --   --   --   --   ALKPHOS 101  --   --   --   --   BILITOT 0.8  --   --   --   --    PROT 4.8*  --   --   --   --   ALBUMIN 2.4*   < > 2.4* 2.3* 1.9*   < > = values in this interval not displayed.   No results for input(s): LIPASE, AMYLASE in the last 168 hours. No results for input(s): AMMONIA in the last 168 hours. CBC: Recent Labs  Lab 04/04/17 0917 04/04/17 1620 04/05/17 0733 04/06/17 0649 04/07/17 0236 04/07/17 0540  WBC 41.8* 38.9* 47.3* 46.5* 36.1* 35.8*  NEUTROABS 29.7* 28.4*  --  39.1*  --   --   HGB 8.1* 7.9* 8.1* 7.7*  6.7* 7.1*  HCT 23.4* 22.5* 23.0* 22.2* 19.3* 20.4*  MCV 80.7 80.9 79.6 78.7 80.1 80.6  PLT 93* 83* 96* 99* 78* 70*   Cardiac Enzymes: No results for input(s): CKTOTAL, CKMB, CKMBINDEX, TROPONINI in the last 168 hours. CBG: No results for input(s): GLUCAP in the last 168 hours.  Iron Studies: No results for input(s): IRON, TIBC, TRANSFERRIN, FERRITIN in the last 72 hours. Studies/Results: Mr Brain Wo Contrast  Result Date: 04/05/2017 CLINICAL DATA:  Recurrent B-cell lymphoma, assess for brain metastasis. EXAM: MRI HEAD WITHOUT CONTRAST TECHNIQUE: Axial diffusion weighted imaging, axial T2 FLAIR, axial T2 and axial T2 SWAN on a 3 tesla scanner. Patient was unable to tolerate further imaging. COMPARISON:  CT HEAD March 30, 2017 and MRI of the head February 07, 2016 FINDINGS: Sequences vary from moderate to severely motion degraded. INTRACRANIAL CONTENTS: No reduced diffusion to suggest acute ischemia or hypercellular tumor. No susceptibility artifact to suggest lobar hemorrhage though motion limits sensitivity for micro hemorrhages. The ventricles and sulci are normal for patient's age. Stable patchy supratentorial white matter FLAIR T2 hyperintensities. No suspicious parenchymal signal, definite masses, mass effect. No abnormal extra-axial fluid collections. No extra-axial masses. VASCULAR: Normal major intracranial vascular flow voids present at skull base. SKULL AND UPPER CERVICAL SPINE: Empty sella better demonstrated on prior MRI. No  suspicious calvarial bone marrow signal. Craniocervical junction maintained. SINUSES/ORBITS: The mastoid air-cells and included paranasal sinuses are well-aerated.The included ocular globes and orbital contents are non-suspicious. OTHER: None. IMPRESSION: 1. Limited noncontrast motion degraded 4 sequence MRI of the head. 2. No acute intracranial process or definite intracranial lymphoma though contrast enhanced sequences are more sensitive. 3. Stable mild-to-moderate chronic small vessel ischemic disease. 4. Stable empty sella. Electronically Signed   By: Elon Alas M.D.   On: 04/05/2017 16:16   Dg Chest Port 1 View  Result Date: 04/06/2017 CLINICAL DATA:  Respiratory failure.  Shortness of breath. EXAM: PORTABLE CHEST 1 VIEW COMPARISON:  04/05/2017; 04/04/2017; 10/10/2026 chest CT-02/06/2017 FINDINGS: Grossly unchanged cardiac silhouette and mediastinal contours. Stable positioning of support apparatus. Improved aeration of lung bases with persistent right heterogeneous/consolidative opacities and suspected trace right-sided effusion. No new focal airspace opacities. No pneumothorax. No evidence of edema. No acute osseus abnormalities. IMPRESSION: Improved aeration the lung bases with persistent right basilar opacities and suspected trace right-sided effusion, similar to the 10/2016 examination and thus favored to represent atelectasis or scar Electronically Signed   By: Sandi Mariscal M.D.   On: 04/06/2017 09:30   . allopurinol  100 mg Oral BID  . Chlorhexidine Gluconate Cloth  6 each Topical Daily  . digoxin  0.125 mg Intravenous Daily  . feeding supplement  1 Container Oral BID BM  . furosemide  80 mg Intravenous Q12H  . levalbuterol  0.63 mg Nebulization BID  . methylPREDNISolone (SOLU-MEDROL) injection  40 mg Intravenous Q12H  . metoprolol tartrate  25 mg Oral Q6H  . potassium chloride  40 mEq Oral Q4H  . senna  2 tablet Oral QHS  . sodium chloride flush  10-40 mL Intracatheter Q12H  .  sodium chloride flush  3 mL Intravenous Q12H    BMET    Component Value Date/Time   NA 140 04/07/2017 0540   NA 143 12/07/2016 0823   K 3.2 (L) 04/07/2017 0540   K 3.5 12/07/2016 0823   CL 98 (L) 04/07/2017 0540   CO2 27 04/07/2017 0540   CO2 24 12/07/2016 0823   GLUCOSE 146 (H) 04/07/2017 0540  GLUCOSE 94 12/07/2016 0823   BUN 38 (H) 04/07/2017 0540   BUN 9.3 12/07/2016 0823   CREATININE 2.36 (H) 04/07/2017 0540   CREATININE 2.48 (H) 03/19/2017 1038   CREATININE 0.7 12/07/2016 0823   CALCIUM 6.1 (LL) 04/07/2017 0540   CALCIUM 8.9 12/07/2016 0823   GFRNONAA 20 (L) 04/07/2017 0540   GFRNONAA 19 (L) 03/19/2017 1038   GFRAA 24 (L) 04/07/2017 0540   GFRAA 22 (L) 03/28/2017 1038   CBC    Component Value Date/Time   WBC 35.8 (H) 04/07/2017 0540   RBC 2.53 (L) 04/07/2017 0540   HGB 7.1 (L) 04/07/2017 0540   HGB 8.8 (L) 12/07/2016 0823   HCT 20.4 (L) 04/07/2017 0540   HCT 27.9 (L) 12/07/2016 0823   PLT 70 (L) 04/07/2017 0540   PLT 16 (L) 03/16/2017 0753   PLT 213 12/07/2016 0823   MCV 80.6 04/07/2017 0540   MCV 90.9 12/07/2016 0823   MCH 28.1 04/07/2017 0540   MCHC 34.8 04/07/2017 0540   RDW 20.7 (H) 04/07/2017 0540   RDW 15.6 (H) 12/07/2016 0823   LYMPHSABS 2.3 04/06/2017 0649   LYMPHSABS 0.4 (L) 12/07/2016 0823   MONOABS 1.4 (H) 04/06/2017 0649   MONOABS 0.6 12/07/2016 0823   EOSABS 0.0 04/06/2017 0649   EOSABS 0.0 12/07/2016 0823   BASOSABS 0.0 04/06/2017 0649   BASOSABS 0.0 12/07/2016 0823     Assessment/Plan: 1. AKI- in setting of refractory, large burden, diffuse large B cell lymphoma on chemo as well as pneumonia c/b Afib/flutter with RVR and hypotension.Doubttumor lysis syndromegiven hypophosphatemia. More consistent with ischemic ATN in setting of volume depletion, tachycardia, relativehypotensive and acute illness. Urine output is picking up and creatinine stabilizing. 1. Overall prognosis is poor and recommend palliative care consult to help set  goals/limits of care.  2. No indication for dialysis and would not offer given progressive lymphoma despite multiple chemotherapy regimens 2. Respiratory distress with acute hypoxemic respiratory failure- currently offBiPap and breathing more comfortably, family did decide no intubation 3. Right mid and lower lung consolidation consistent with HCAP. abx per primary team 4. Hypokalemia- will replete with po KCl per Dr. Algis Liming. 5. Hypomagnesemia- d/w dr. Carson Myrtle, will give 2 gram IV today and follow 6. Metabolic acidosis due to #1- agree with stopping isotonic bicarb drip and hold off on further IVF's for now but need to follow her intake 7. Refractory DLBCL despite multiple chemo regimens. Prognosis is grim and appears to be progressive despite chemo 8. Hypophosphatemia- likely due to poor po intake doubt Fanconi syndrome due to lack of glucosuria. Will replete and with K phos again and follow.  9. 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 Thrombocytopenia- due to malignancy 11. Severe protein and caloric malnutrition 12. A flutter with RVR failed DCCV and now onamio andcardizem drip with evidence of pulmonary edema and diastolic heart failure. Cardiology following and agree that improving potassium level might help with tachycardia.  EP evaluated pt and no good options due her overall condition. 13. 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 14. Disposition- overall prognosis is poor. Recommend palliative care consult to help set goal/limits of care as she is currently a full code and probability of survival is low. Consider transitioning to comfort care.   Donetta Potts, MD [employer] [phone]

## 2017-04-07 NOTE — Progress Notes (Signed)
Progress Note  Patient Name: Jo Collins Date of Encounter: 04/07/2017  Primary Cardiologist: No primary care provider on file.   Subjective   The same situation as apparently she was in yesterday.  Remains in 2-1 AV block with atrial flutter.  No major complaints today.  She did get up and get to a chair yesterday.  Inpatient Medications    Scheduled Meds: . allopurinol  100 mg Oral BID  . Chlorhexidine Gluconate Cloth  6 each Topical Daily  . digoxin  0.125 mg Intravenous Daily  . feeding supplement  1 Container Oral BID BM  . furosemide  80 mg Intravenous Q12H  . levalbuterol  0.63 mg Nebulization BID  . methylPREDNISolone (SOLU-MEDROL) injection  40 mg Intravenous Q12H  . metoprolol tartrate  25 mg Oral Q6H  . potassium chloride  40 mEq Oral Q4H  . senna  2 tablet Oral QHS  . sodium chloride flush  10-40 mL Intracatheter Q12H  . sodium chloride flush  3 mL Intravenous Q12H   Continuous Infusions: . sodium chloride    . amiodarone 30 mg/hr (04/07/17 0830)  . ceFEPime (MAXIPIME) IV Stopped (04/06/17 2312)  . diltiazem (CARDIZEM) infusion 10 mg/hr (04/07/17 0830)  . heparin 1,150 Units/hr (04/07/17 0830)   PRN Meds: acetaminophen, fentaNYL (SUBLIMAZE) injection, levalbuterol, ondansetron (ZOFRAN) IV, sodium chloride flush, sodium chloride flush, traZODone   Vital Signs    Vitals:   04/07/17 0445 04/07/17 0832 04/07/17 0900 04/07/17 0915  BP: (!) 99/49  117/60 90/71  Pulse: (!) 112  (!) 114 (!) 114  Resp:   13 10  Temp:   98.8 F (37.1 C) 98.8 F (37.1 C)  TempSrc:   Oral Oral  SpO2:  100% 98% 100%  Weight:      Height:        Intake/Output Summary (Last 24 hours) at 04/07/2017 1047 Last data filed at 04/07/2017 0900 Gross per 24 hour  Intake 7166.18 ml  Output 2950 ml  Net 4216.18 ml   Filed Weights   03/20/2017 1855 04/03/17 0358  Weight: 254 lb 10.1 oz (115.5 kg) 275 lb 2.2 oz (124.8 kg)    Telemetry    Atrial flutter with 2-1 AV block- Personally  Reviewed  ECG    None new  - Personally Reviewed  Physical Exam   GEN: Morbid obesity, in no acute distress  HEENT: normal  Neck: no JVD, carotid bruits, or masses Cardiac: tachycardic, regular, no murmurs, rubs, or gallops,no edema  Respiratory:  clear to auscultation bilaterally, normal work of breathing GI: soft, nontender, nondistended, + BS MS: no deformity or atrophy  Skin: warm and dry, device site well healed Neuro:  Strength and sensation are intact Psych: euthymic mood, full affect   Labs    Chemistry Recent Labs  Lab 04/04/17 0917  04/05/17 0733  04/06/17 0649 04/06/17 1325 04/07/17 0236 04/07/17 0540  NA 140   < > 141   < > 141 137 140 140  K 3.8   < > 3.6   < > 3.0* 3.1* 2.5* 3.2*  CL 116*   < > 112*   < > 103 103 99* 98*  CO2 13*   < > 16*   < > 21* _0 GLUCOSE 94   < > 153*   < > 156* 160* 139* 146*  BUN 30*   < > 38*   < > 42* 42* 36* 38*  CREATININE 2.41*   < > 2.55*   < >  2.58* 2.49* 2.10* 2.36*  CALCIUM 8.0*   < > 7.6*   < > 6.7* 6.3* 5.4* 6.1*  PROT 4.8*  --   --   --   --   --   --   --   ALBUMIN 2.4*   < > 2.4*  --  2.3*  --  1.9*  --   AST 47*  --   --   --   --   --   --   --   ALT 23  --   --   --   --   --   --   --   ALKPHOS 101  --   --   --   --   --   --   --   BILITOT 0.8  --   --   --   --   --   --   --   GFRNONAA 20*   < > 19*   < > 18* 19* 23* 20*  GFRAA 23*   < > 21*   < > 21* 22* 27* 24*  ANIONGAP 11   < > 13   < > 17* _0 < > = values in this interval not displayed.     Hematology Recent Labs  Lab 04/06/17 0649 04/07/17 0236 04/07/17 0540  WBC 46.5* 36.1* 35.8*  RBC 2.82* 2.41* 2.53*  HGB 7.7* 6.7* 7.1*  HCT 22.2* 19.3* 20.4*  MCV 78.7 80.1 80.6  MCH 27.3 27.8 28.1  MCHC 34.7 34.7 34.8  RDW 20.7* 20.5* 20.7*  PLT 99* 78* 70*    Cardiac EnzymesNo results for input(s): TROPONINI in the last 168 hours. No results for input(s): TROPIPOC in the last 168 hours.   BNPNo results for input(s): BNP, PROBNP  in the last 168 hours.   DDimer No results for input(s): DDIMER in the last 168 hours.   Radiology    Mr Brain Wo Contrast  Result Date: 04/05/2017 CLINICAL DATA:  Recurrent B-cell lymphoma, assess for brain metastasis. EXAM: MRI HEAD WITHOUT CONTRAST TECHNIQUE: Axial diffusion weighted imaging, axial T2 FLAIR, axial T2 and axial T2 SWAN on a 3 tesla scanner. Patient was unable to tolerate further imaging. COMPARISON:  CT HEAD March 30, 2017 and MRI of the head February 07, 2016 FINDINGS: Sequences vary from moderate to severely motion degraded. INTRACRANIAL CONTENTS: No reduced diffusion to suggest acute ischemia or hypercellular tumor. No susceptibility artifact to suggest lobar hemorrhage though motion limits sensitivity for micro hemorrhages. The ventricles and sulci are normal for patient's age. Stable patchy supratentorial white matter FLAIR T2 hyperintensities. No suspicious parenchymal signal, definite masses, mass effect. No abnormal extra-axial fluid collections. No extra-axial masses. VASCULAR: Normal major intracranial vascular flow voids present at skull base. SKULL AND UPPER CERVICAL SPINE: Empty sella better demonstrated on prior MRI. No suspicious calvarial bone marrow signal. Craniocervical junction maintained. SINUSES/ORBITS: The mastoid air-cells and included paranasal sinuses are well-aerated.The included ocular globes and orbital contents are non-suspicious. OTHER: None. IMPRESSION: 1. Limited noncontrast motion degraded 4 sequence MRI of the head. 2. No acute intracranial process or definite intracranial lymphoma though contrast enhanced sequences are more sensitive. 3. Stable mild-to-moderate chronic small vessel ischemic disease. 4. Stable empty sella. Electronically Signed   By: Elon Alas M.D.   On: 04/05/2017 16:16   Dg Chest Port 1 View  Result Date: 04/06/2017 CLINICAL DATA:  Respiratory failure.  Shortness of breath. EXAM: PORTABLE CHEST 1 VIEW COMPARISON:  04/05/2017;  04/04/2017; 10/10/2026 chest CT-02/06/2017 FINDINGS: Grossly unchanged cardiac silhouette and mediastinal contours. Stable positioning of support apparatus. Improved aeration of lung bases with persistent right heterogeneous/consolidative opacities and suspected trace right-sided effusion. No new focal airspace opacities. No pneumothorax. No evidence of edema. No acute osseus abnormalities. IMPRESSION: Improved aeration the lung bases with persistent right basilar opacities and suspected trace right-sided effusion, similar to the 10/2016 examination and thus favored to represent atelectasis or scar Electronically Signed   By: Sandi Mariscal M.D.   On: 04/06/2017 09:30    Cardiac Studies   Echo 04/01/17 Study Conclusions  - HPI and indications: Atrial flutter 427.32. - Procedure narrative: Transthoracic echocardiography. Image quality was poor. The study was technically difficult, as a result of body habitus. - Left ventricle: The cavity size was normal. Wall thickness was increased in a pattern of moderate LVH. Systolic function was vigorous. The estimated ejection fraction was 75%. The study is not technically sufficient to allow evaluation of LV diastolic function. - Mitral valve: Calcified annulus. Mildly thickened leaflets . At least mild stenosis. There was trivial regurgitation. Valve area by continuity equation (using LVOT flow): 1.68 cm^2. - Left atrium: Moderately dilated. - Atrial septum: There was increased thickness of the septum, consistent with lipomatous hypertrophy. - Tricuspid valve: There was mild regurgitation. - Pulmonary arteries: PA peak pressure: 32 mm Hg (S). - Inferior vena cava: The vessel was normal in size. The respirophasic diameter changes were in the normal range (>= 50%), consistent with normal central venous pressure.  Impressions:  - Technically difficult study. LVEF 75%, rapid atrial flutter is noted, moderate LVH, normal wall  motion, MAC with at least mild stenosis and trivial MR, moderae LAE, mild TR, RVSP 32 mmHg, normal IVC, no pericardial effusion.  TEE/DCCV 04/05/2017 FINDINGS: Mild biatrial dilation. Excellent left atrial appendage emptying velocities. No thrombus. LVEF>65%. Mild MR, mild TR. Normal aorta. No pericardial effusion.  Cardioverted1time(s).  Cardioversion with synchronized biphasic120Jshock.    Patient Profile     67 y.o. female with history of diffuse large B-cell lymphoma, sciatica, GERD, morbid obesity who presented with acute renal failure, pancytopenia and concern for pneumonia and sepsis. Underwent cardioversion for atrial flutter with rapid ventricular response on April 1, with return to atrial fibrillation roughly 16 hours later, now back in atrial flutter with 2: 1 AV block.  Assessment & Plan    1. AFlutter: Fortunately few options from what we are already doing.  Her blood pressure remains in the low 100s.  She is volume overloaded, positive by 18.5 L.  This is likely contributing to her atrial flutter as well.  Would continue amiodarone at the current dose.  We will try for cardioversion later this week.  If possible, she would benefit from diuresis as this may help to get her back into normal rhythm.  2. ARF: Stable over the last 24 hours.  Needs potassium replacement per primary team.  3.Acute respiratory failure with hypoxia: Not consistent with heart failure at this time.  She is quite volume overloaded, but has been receiving quite a bit of volume with her medications.  Will eventually need diuresis once her blood pressure can tolerate.     4. 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  Morbid obesity   For questions or updates, please contact [place] Please consult [email or web address] for contact info under Cardiology/STEMI.      Signed, Will Meredith Leeds, MD  04/07/2017, 10:47 AM

## 2017-04-07 NOTE — Progress Notes (Signed)
ANTICOAGULATION CONSULT NOTE  Pharmacy Consult for heparin Indication: atrial fibrillation  No Known Allergies  Patient Measurements: Height: 5\' 1"  (154.9 cm) Weight: 275 lb 2.2 oz (124.8 kg) IBW/kg (Calculated) : 47.8 Heparin Dosing Weight: 76 kg  Vital Signs: Temp: 98.1 F (36.7 C) (04/05 2335) Temp Source: Oral (04/05 2335) BP: 98/53 (04/05 2335) Pulse Rate: 108 (04/05 2335)  Labs: Recent Labs    04/05/17 0733  04/05/17 2129 04/06/17 0649 04/06/17 1325 04/07/17 0236 04/07/17 0242  HGB 8.1*  --   --  7.7*  --  6.7*  --   HCT 23.0*  --   --  22.2*  --  19.3*  --   PLT 96*  --   --  99*  --  78*  --   HEPARINUNFRC 0.53  --   --  0.62  --   --  0.79*  CREATININE 2.55*   < > 2.60* 2.58* 2.49*  --   --    < > = values in this interval not displayed.     Assessment: Patient is a 67 y.o. F with hx lymphoma currently undergoing chemotherapy treatment presented to the ED on 03/09/2017 with afib with RVR s/p s/p DCCV on 4/1 but converted back to afib. She's currently on heparin drip. SOB has worsened and she was on bipap for a short period.  Palliative care consulted for goals of care  -plt= 99 ( lymphoma with recent chemo)  Heparin level is supratherapeutic.  Goal of Therapy:  Heparin level 0.3-0.7 units/ml Monitor platelets by anticoagulation protocol: Yes   Plan - Reduce heparin to 1150 units/hr - Daily heparin level and CBC - Check 8 hour level   Harvel Quale 04/07/2017 3:30 AM

## 2017-04-07 NOTE — Progress Notes (Signed)
Patient ID: Jo Collins, female   DOB: 1950/08/14, 67 y.o.   MRN: [id number]  PROGRESS NOTE    [place] Moyd  VQX:[id number] DOB: 05-17-1950 DOA: 03/17/2017 PCP: Deland Pretty, MD   Brief Narrative:  67 year old female with history of paroxysmal atrial fibrillation not on anticoagulation, large B-cell lymphoma with ongoing chemotherapy and recent tumor lysis syndrome resulting in acute renal failure anemia and hospitalization, morbid obesity, chronic back pain, prior biopsy of the right iliac crest in January and ongoing issues with low back pain with radiation down to the left leg with weakness and numbness was admitted on 03/14/2017 with complaints of leg pain, diplopia.  She was admitted for A. fib with RVR and possible sepsis from pneumonia.  She was started on antibiotics.  Cardiology was consulted.  He had a TEE and  cardioversion on 04/05/2017 but then converted back to A. fib.  Neurology was also consulted for neurological symptoms an MRI was ordered.  MRI could not be done at [facility] so she was transferred to [facility] on 04/03/2017.  She had LP done on 04/21/2017.  Oncology is also following.  Nephrology was consulted for worsening renal function and acidosis. PCCM was reconsulted for worsening hypoxia.  Assessment & Plan:   Active Problems:   NHL (non-Hodgkin's lymphoma) (HCC)   Morbid obesity with BMI of 50.0-59.9, adult (HCC)   Diffuse large B cell lymphoma (HCC)   Recurrent right pleural effusion   Pleural effusion   Atrial fibrillation with tachycardic ventricular rate (HCC)   Atrial flutter (HCC)   Sepsis (HCC)   Leukocytosis   Acute on chronic renal failure (HCC)   HCAP (healthcare-associated pneumonia)   3rd nerve palsy, complete, right   Hyperuricemia   Acute respiratory failure with hypoxia (HCC)   AKI (acute kidney injury) ([place])   Palliative care encounter   DNI (do not intubate)   Counseling regarding advance care planning and goals of care   Acute  hypoxic respiratory failure  -Probably from combination of pneumonia/volume overload/A. fib with RVR -Required intermittent BiPAP.  Off BiPAP possibly since 4/4 morning.  Continue oxygen supplementation as needed and wean as tolerated -Continue antibiotics and diuresis.  On empiric steroids as well> taper -Critical care following: Ultrasound of chest showed only small effusion not significant to tap  - As per CCM follow-up, treat for pneumonia as indicated below.  CCM signed off 04/06/17. - Improved  Atrial flutter with RVR -Cardiology following: Underwent TEE and DCCV on 04/19/2017 -Currently on Cardizem and amiodarone drips -Not on anticoagulation as an outpatient.  Currently on heparin drip - EP Cardiology follow-up appreciated >suspect volume overload contributing to her atrial flutter, continue diuresis, continue amiodarone, plan for cardioversion next week.  Digoxin added.  Rate is better than yesterday. -Currently on Lasix IV 80 mg every 12.  +18 L since admission.  Also IV fluids being given for acute kidney injury were discontinued 4/6 which should help volume status.  Sepsis from pneumonia -Cultures negative so far.  Continue antibiotics as below  Right-sided pneumonia with pleural effusion -Currently on cefepime. No evidence of MRSA pneumonia. vancomycin discontinued on 04/04/2017.  No significant pleural effusion as per PCCM -As per CCM follow-up, would treat with cefepime for 5 days and then transition to oral antibiotics for total of 10-14 days depending on resolution of infiltrate.  Leukocytosis -Worsening. Probably from pneumonia/A. fib/hypoxia and lymphoma.  Follow CBCs closely.  No significant change.  Acute renal failure with metabolic acidosis -Questionable prerenal versus  tumor lysis syndrome.  Baseline creatinine less than 1 -Nephrology following.  If renal function worsens, patient might need CVVHD and transfer to ICU.  Monitor strict input and output. -UA was negative  for infection and renal ultrasound was negative for hydronephrosis -Currently on intravenous Lasix and bicarb drip -Creatinine has slightly improved from 2.62 days ago to 2.3.  Discussed with nephrology, discontinued bicarbonate drip, replace potassium and magnesium.  Follow BMP.  Hypokalemia/hypomagnesemia -Replace and follow. Corrected Ca; 7.8  Right 3rd nerve palsy and left leg weakness in the setting of B-cell lymphoma -Neurology following.  Status post LP done on 04/03/2017 with cytology pending -MRI of the brain along with MRA/MRV of the head and MRI of the lumbosacral spine/sacral plexus as per neurology recommendations: This will be few hours study.  I do not think patient will be able to undergo all these MRI with current condition.  I think it would be safe for her to be on a ventilator prior to getting these tests done. -As per my discussion with Dr. Leonel Ramsay, Neurology, patient unable to tolerate performing MRI due to back pain and claustrophobia.  MRI that was done was a limited study and showed no acute findings.  Not candidate for performing under sedation due to concern for landing up on ventilator and unable to wean.  Will need to update and discuss with her primary oncologist. -Neurology follow-up and recommendations detailed in their note 4/6, please refer.  Awaiting MRA but no other workup.  Discuss with primary oncologist on 4/8.  Relapsed diffuse B-cell lymphoma -Oncology following. -Overall prognosis is guarded -Discussed with palliative care, now DNI and they will continue to follow.  Overall poor prognosis.  Anemia and thrombocytopenia -Probably from lymphoma and chemotherapy -Transfuse to keep hemoglobin above 7.  Hemoglobin down to 7.1.  Transfuse 1 unit PRBC.  Follow CBC in a.m.  Morbid obesity -Outpatient follow-up  DVT prophylaxis: Heparin drip Code Status: Full Family Communication: Spoke to husband at bedside, updated care and answered  questions. Disposition Plan: Depends on clinical outcome.  Not medically stable for discharge.  Consultants: Neurology/cardiology/PCCM/oncology/palliative care team.  Procedures: TEE/DCCV 04/07/2017 Mild biatrial dilation. Excellent left atrial appendage emptying velocities. No thrombus. LVEF>65%. Mild MR, mild TR. Normal aorta. No pericardial effusion.  Cardioverted1time(s).  Cardioversion with synchronized biphasic120Jshock.  Echo 04/01/17 Study Conclusions  - HPI and indications: Atrial flutter 427.32. - Procedure narrative: Transthoracic echocardiography. Image quality was poor. The study was technically difficult, as a result of body habitus. - Left ventricle: The cavity size was normal. Wall thickness was increased in a pattern of moderate LVH. Systolic function was vigorous. The estimated ejection fraction was 75%. The study is not technically sufficient to allow evaluation of LV diastolic function. - Mitral valve: Calcified annulus. Mildly thickened leaflets . At least mild stenosis. There was trivial regurgitation. Valve area by continuity equation (using LVOT flow): 1.68 cm^2. - Left atrium: Moderately dilated. - Atrial septum: There was increased thickness of the septum, consistent with lipomatous hypertrophy. - Tricuspid valve: There was mild regurgitation. - Pulmonary arteries: PA peak pressure: 32 mm Hg (S). - Inferior vena cava: The vessel was normal in size. The respirophasic diameter changes were in the normal range (>= 50%), consistent with normal central venous pressure.  Impressions:  - Technically difficult study. LVEF 75%, rapid atrial flutter is noted, moderate LVH, normal wall motion, MAC with at least mild stenosis and trivial MR, moderae LAE, mild TR, RVSP 32 mmHg, normal IVC, no pericardial effusion.  Antimicrobials:  Cefepime from 03/03/2017 onwards Vancomycin from 03/14/2017-04/03/17  Subjective: Patient  reports continued improvement.  No dyspnea or pain reported.  Interviewed and examined with spouse at bedside and RN in room.  Had BM yesterday.  No bleeding reported.  Objective: Vitals:   04/07/17 0915 04/07/17 1100 04/07/17 1145 04/07/17 1200  BP: 90/71 99/77 (!) 88/58 (!) 95/56  Pulse: (!) 114 (!) 106 (!) 106 (!) 107  Resp: 10 (!) 23 18 (!) 27  Temp: 98.8 F (37.1 C) 98 F (36.7 C) 97.9 F (36.6 C)   TempSrc: Oral Oral    SpO2: 100% 100% 100% 100%  Weight:      Height:        Intake/Output Summary (Last 24 hours) at 04/07/2017 1632 Last data filed at 04/07/2017 1600 Gross per 24 hour  Intake 8220.68 ml  Output 3150 ml  Net 5070.68 ml   Filed Weights   03/31/2017 1855 04/03/17 0358  Weight: 115.5 kg (254 lb 10.1 oz) 124.8 kg (275 lb 2.2 oz)    Examination:  General exam: Middle-aged female, moderately built and nourished, chronically ill looking, lying comfortably propped up in bed. Respiratory system: Reduced breath sounds in the bases with few bibasilar crackles.  No wheezing or rhonchi appreciated.  No increased work of breathing.  No change/stable. cardiovascular system: S1 and S2 heard, irregularly irregular and tachycardic.  No JVD or murmurs.  1+ leg edema.  Telemetry personally reviewed: Atrial flutter with 2 and 1 block. Gastrointestinal system: Abdomen is morbidly obese, nondistended, soft and nontender. Normal bowel sounds heard.  Stable without change. Central nervous system: Alert and oriented x2.  However keeps repeating sentences at times.  No new focal neurological deficits. Extremities: Moves all limbs symmetrically.  skin: No rashes, lesions or ulcers Psychiatry: Pleasant and appropriate.   Data Reviewed: I have personally reviewed following labs and imaging studies  CBC: Recent Labs  Lab 04/15/2017 0500  04/04/17 0917 04/04/17 1620 04/05/17 0733 04/06/17 0649 04/07/17 0236 04/07/17 0540  WBC 33.0*   < > 41.8* 38.9* 47.3* 46.5* 36.1* 35.8*   NEUTROABS 25.4*  --  29.7* 28.4*  --  39.1*  --   --   HGB 7.7*   < > 8.1* 7.9* 8.1* 7.7* 6.7* 7.1*  HCT 23.1*   < > 23.4* 22.5* 23.0* 22.2* 19.3* 20.4*  MCV 83.1   < > 80.7 80.9 79.6 78.7 80.1 80.6  PLT 124*   < > 93* 83* 96* 99* 78* 70*   < > = values in this interval not displayed.   Basic Metabolic Panel: Recent Labs  Lab 04/04/17 0917 04/04/17 1620  04/05/17 0733  04/05/17 2129 04/06/17 0649 04/06/17 1325 04/07/17 0236 04/07/17 0540  NA 140 138  138   < > 141   < > 140 141 137 140 140  K 3.8 4.0  4.0   < > 3.6   < > 3.3* 3.0* 3.1* 2.5* 3.2*  CL 116* 114*  114*   < > 112*   < > 108 103 103 99* 98*  CO2 13* 13*  13*   < > 16*   < > 19* 21* 22 27 27   GLUCOSE 94 136*  137*   < > 153*   < > 176* 156* 160* 139* 146*  BUN 30* 32*  33*   < > 38*   < > 41* 42* 42* 36* 38*  CREATININE 2.41* 2.57*  2.57*   < > 2.55*   < >  2.60* 2.58* 2.49* 2.10* 2.36*  CALCIUM 8.0* 7.8*  7.8*   < > 7.6*   < > 7.1* 6.7* 6.3* 5.4* 6.1*  MG 1.7  --   --   --   --   --  1.5* 1.6* 1.2* 1.4*  PHOS  --  <1.0*  --  1.0*  --   --  1.7*  --  2.2*  --    < > = values in this interval not displayed.   GFR: Estimated Creatinine Clearance: 29.1 mL/min (A) (by C-G formula based on SCr of 2.36 mg/dL (H)). Liver Function Tests: Recent Labs  Lab 04/04/17 0917 04/04/17 1620 04/05/17 0733 04/06/17 0649 04/07/17 0236  AST 47*  --   --   --   --   ALT 23  --   --   --   --   ALKPHOS 101  --   --   --   --   BILITOT 0.8  --   --   --   --   PROT 4.8*  --   --   --   --   ALBUMIN 2.4* 2.4* 2.4* 2.3* 1.9*   Sepsis Labs: Recent Labs  Lab 04/01/17 0850 04/01/17 1541 04/04/17 2140  LATICACIDVEN 2.4* 1.9 1.9    Recent Results (from the past 240 hour(s))  Blood Culture (routine x 2)     Status: None   Collection Time: 04/01/2017  1:40 PM  Result Value Ref Range Status   Specimen Description   Final    BLOOD LEFT ANTECUBITAL Performed at [facility], [place] [address],  [place], [place] [address]    Special Requests   Final    BOTTLES DRAWN AEROBIC AND ANAEROBIC Blood Culture results may not be optimal due to an inadequate volume of blood received in culture bottles Performed at [place] [address] [place], [place] [address]    Culture   Final    NO GROWTH 5 DAYS Performed at [facility], [place] [address], [place], [place] [address]    Report Status 04/04/2017 FINAL  Final  MRSA PCR Screening     Status: None   Collection Time: 03/11/2017  6:56 PM  Result Value Ref Range Status   MRSA by PCR NEGATIVE NEGATIVE Final    Comment:        The GeneXpert MRSA Assay (FDA approved for NASAL specimens only), is one component of a comprehensive MRSA colonization surveillance program. It is not intended to diagnose MRSA infection nor to guide or monitor treatment for MRSA infections. Performed at [facility], [place] [address], [place], [place] [address]   Blood Culture (routine x 2)     Status: None   Collection Time: 03/22/2017  7:14 PM  Result Value Ref Range Status   Specimen Description   Final    BLOOD RIGHT HAND Performed at [place] [address] [place], [place] [address]    Special Requests   Final    AEB BCLV Performed at [place] [address] [place], [place] [address]    Culture   Final    NO GROWTH 5 DAYS Performed at [facility], [place] [address], [place],  [address]    Report Status 04/04/2017 FINAL  Final  Fungus Stain     Status: None   Collection Time: 04/03/17  1:47 PM  Result Value Ref Range Status   FUNGUS STAIN  Final report  Final    Comment: (NOTE) Performed At: [facility] [place], [place] [id number] Rush Farmer MD RF:[id number]    Fungal Source CSF  Final    Comment: Performed at [place] [address], [place], [place] [address]  Fungal Stain reflex      Status: None   Collection Time: 04/03/17  1:47 PM  Result Value Ref Range Status   Fungal stain result 1 Comment  Final    Comment: (NOTE) KOH/Calcofluor preparation:  no fungus observed. Performed At: [facility] [place], [place] [id number] Rush Farmer MD PQ:[id number] Performed at [facility], [place] [address], [place], [place] [address]   CSF culture     Status: None (Preliminary result)   Collection Time: 04/03/17  1:54 PM  Result Value Ref Range Status   Specimen Description CSF  Final   Special Requests NONE  Final   Gram Stain   Final    NO WBC SEEN NO ORGANISMS SEEN CYTOSPIN SMEAR Gram Stain Report Called to,Read Back By and Verified With: E.MATHAI AT 1618 ON 04/03/17 BY N.THOMPSON Performed at [facility], [place] [address] [place], [place] [address]    Culture PENDING  Incomplete   Report Status PENDING  Incomplete         Radiology Studies: Dg Chest Port 1 View  Result Date: 04/06/2017 CLINICAL DATA:  Respiratory failure.  Shortness of breath. EXAM: PORTABLE CHEST 1 VIEW COMPARISON:  04/05/2017; 04/04/2017; 10/10/2026 chest CT-02/06/2017 FINDINGS: Grossly unchanged cardiac silhouette and mediastinal contours. Stable positioning of support apparatus. Improved aeration of lung bases with persistent right heterogeneous/consolidative opacities and suspected trace right-sided effusion. No new focal airspace opacities. No pneumothorax. No evidence of edema. No acute osseus abnormalities. IMPRESSION: Improved aeration the lung bases with persistent right basilar opacities and suspected trace right-sided effusion, similar to the 10/2016 examination and thus favored to represent atelectasis or scar Electronically Signed   By: Sandi Mariscal M.D.   On: 04/06/2017 09:30        Scheduled Meds: . allopurinol  100 mg Oral BID  . Chlorhexidine Gluconate Cloth  6 each Topical Daily  . digoxin  0.125 mg Intravenous Daily   . feeding supplement  1 Container Oral BID BM  . furosemide  80 mg Intravenous Q12H  . methylPREDNISolone (SOLU-MEDROL) injection  40 mg Intravenous Q12H  . metoprolol tartrate  25 mg Oral Q6H  . potassium chloride  40 mEq Oral BID  . senna  2 tablet Oral QHS  . sodium chloride flush  10-40 mL Intracatheter Q12H  . sodium chloride flush  3 mL Intravenous Q12H   Continuous Infusions: . sodium chloride 10 mL/hr at 04/07/17 1600  . amiodarone 30 mg/hr (04/07/17 1600)  . ceFEPime (MAXIPIME) IV Stopped (04/06/17 2312)  . diltiazem (CARDIZEM) infusion 10 mg/hr (04/07/17 1600)  . heparin 1,000 Units/hr (04/07/17 1600)  . potassium PHOSPHATE IVPB (mmol) 30 mmol (04/07/17 1208)     LOS: 8 days        Vernell Leep, MD, FACP, [facility]. [facility] Pager [phone]  If 7PM-7AM, please contact night-coverage [email or web address] Password [facility] 04/07/2017, 4:32 PM

## 2017-04-08 LAB — CBC
HCT: 23.5 % — ABNORMAL LOW (ref 36.0–46.0)
Hemoglobin: 7.9 g/dL — ABNORMAL LOW (ref 12.0–15.0)
MCH: 27.6 pg (ref 26.0–34.0)
MCHC: 33.6 g/dL (ref 30.0–36.0)
MCV: 82.2 fL (ref 78.0–100.0)
Platelets: 69 10*3/uL — ABNORMAL LOW (ref 150–400)
RBC: 2.86 MIL/uL — AB (ref 3.87–5.11)
RDW: 19.3 % — ABNORMAL HIGH (ref 11.5–15.5)
WBC: 34.6 10*3/uL — AB (ref 4.0–10.5)

## 2017-04-08 LAB — RENAL FUNCTION PANEL
ALBUMIN: 2.2 g/dL — AB (ref 3.5–5.0)
Anion gap: 16 — ABNORMAL HIGH (ref 5–15)
BUN: 38 mg/dL — ABNORMAL HIGH (ref 6–20)
CALCIUM: 5.5 mg/dL — AB (ref 8.9–10.3)
CO2: 27 mmol/L (ref 22–32)
CREATININE: 2.33 mg/dL — AB (ref 0.44–1.00)
Chloride: 96 mmol/L — ABNORMAL LOW (ref 101–111)
GFR, EST AFRICAN AMERICAN: 24 mL/min — AB (ref >60)
GFR, EST NON AFRICAN AMERICAN: 21 mL/min — AB (ref >60)
Glucose, Bld: 143 mg/dL — ABNORMAL HIGH (ref 65–99)
PHOSPHORUS: 3.6 mg/dL (ref 2.5–4.6)
Potassium: 3.2 mmol/L — ABNORMAL LOW (ref 3.5–5.1)
SODIUM: 139 mmol/L (ref 135–145)

## 2017-04-08 LAB — TYPE AND SCREEN
ABO/RH(D): A POS
ANTIBODY SCREEN: NEGATIVE
Unit division: 0

## 2017-04-08 LAB — BPAM RBC
Blood Product Expiration Date: 201904112359
ISSUE DATE / TIME: 201904060856
UNIT TYPE AND RH: 600

## 2017-04-08 LAB — HEPARIN LEVEL (UNFRACTIONATED)
HEPARIN UNFRACTIONATED: 0.67 [IU]/mL (ref 0.30–0.70)
HEPARIN UNFRACTIONATED: 0.53 [IU]/mL (ref 0.30–0.70)

## 2017-04-08 LAB — MAGNESIUM: MAGNESIUM: 1.5 mg/dL — AB (ref 1.7–2.4)

## 2017-04-08 LAB — DIGOXIN LEVEL: Digoxin Level: 2.3 ng/mL — ABNORMAL HIGH (ref 0.8–2.0)

## 2017-04-08 MED ORDER — AMIODARONE HCL 200 MG PO TABS
400.0000 mg | ORAL_TABLET | Freq: Two times a day (BID) | ORAL | Status: DC
  Administered 2017-04-08 – 2017-04-11 (×8): 400 mg via ORAL
  Filled 2017-04-08 (×8): qty 2

## 2017-04-08 MED ORDER — POTASSIUM CHLORIDE CRYS ER 20 MEQ PO TBCR
40.0000 meq | EXTENDED_RELEASE_TABLET | Freq: Three times a day (TID) | ORAL | Status: DC
  Administered 2017-04-08 – 2017-04-11 (×11): 40 meq via ORAL
  Filled 2017-04-08 (×12): qty 2

## 2017-04-08 MED ORDER — AMOXICILLIN-POT CLAVULANATE 500-125 MG PO TABS
500.0000 mg | ORAL_TABLET | Freq: Two times a day (BID) | ORAL | Status: DC
  Administered 2017-04-08 – 2017-04-11 (×7): 500 mg via ORAL
  Filled 2017-04-08 (×7): qty 1

## 2017-04-08 MED ORDER — MAGNESIUM SULFATE 4 GM/100ML IV SOLN
4.0000 g | Freq: Once | INTRAVENOUS | Status: AC
  Administered 2017-04-08: 4 g via INTRAVENOUS
  Filled 2017-04-08: qty 100

## 2017-04-08 MED ORDER — SODIUM CHLORIDE 0.9 % IV SOLN
1.0000 g | Freq: Once | INTRAVENOUS | Status: AC
  Administered 2017-04-08: 1 g via INTRAVENOUS
  Filled 2017-04-08: qty 10

## 2017-04-08 MED ORDER — CALCIUM CARBONATE 1250 (500 CA) MG PO TABS
500.0000 mg | ORAL_TABLET | Freq: Three times a day (TID) | ORAL | Status: DC
  Administered 2017-04-08 – 2017-04-12 (×11): 500 mg via ORAL
  Filled 2017-04-08 (×14): qty 1

## 2017-04-08 NOTE — Progress Notes (Signed)
Patient ID: Jo Collins, female   DOB: [birth year], 67 y.o.   MRN: [id number]  PROGRESS NOTE    Jo Collins  QJJ:[id number] DOB: September 27, 1950 DOA: 03/06/2017 PCP: Deland Pretty, MD   Brief Narrative:  67 year old female with history of paroxysmal atrial fibrillation not on anticoagulation, large B-cell lymphoma with ongoing chemotherapy and recent tumor lysis syndrome resulting in acute renal failure anemia and hospitalization, morbid obesity, chronic back pain, prior biopsy of the right iliac crest in January and ongoing issues with low back pain with radiation down to the left leg with weakness and numbness was admitted on 03/02/2017 with complaints of leg pain, diplopia.  She was admitted for A. fib with RVR and possible sepsis from pneumonia.  She was started on antibiotics.  Cardiology was consulted.  He had a TEE and  cardioversion on 04/18/2017 but then converted back to A. fib.  Neurology was also consulted for neurological symptoms an MRI was ordered.  MRI could not be done at [facility] so she was transferred to [facility] on 04/03/2017.  She had LP done on 04/14/2017.  Oncology is also following.  Nephrology was consulted for worsening renal function and acidosis. PCCM was reconsulted for worsening hypoxia.  Assessment & Plan:   Active Problems:   NHL (non-Hodgkin's lymphoma) (HCC)   Morbid obesity with BMI of 50.0-59.9, adult (HCC)   Diffuse large B cell lymphoma (HCC)   Recurrent right pleural effusion   Pleural effusion   Atrial fibrillation with tachycardic ventricular rate (HCC)   Atrial flutter (HCC)   Sepsis (HCC)   Leukocytosis   Acute on chronic renal failure (HCC)   HCAP (healthcare-associated pneumonia)   3rd nerve palsy, complete, right   Hyperuricemia   Acute respiratory failure with hypoxia (HCC)   AKI (acute kidney injury) ([place])   Palliative care encounter   DNI (do not intubate)   Counseling regarding advance care planning and goals of care   Acute  hypoxic respiratory failure  -Probably from combination of pneumonia/volume overload/A. fib with RVR -Required intermittent BiPAP.  Off BiPAP possibly since 4/4 morning.   -Continue antibiotics and diuresis.  On empiric steroids as well> tapering -Critical care signed off: Ultrasound of chest showed only small effusion not significant to tap  - As per CCM follow-up, treat for pneumonia as indicated below.  CCM signed off 04/06/17. -Hypoxia resolved and patient has been on room air for at least the last 48 hours.  Atrial flutter with RVR -Cardiology following: Underwent TEE and DCCV on 04/29/2017 -Currently on Cardizem and amiodarone drips -Not on anticoagulation as an outpatient.  Currently on heparin drip - EP Cardiology follow-up appreciated >suspect volume overload contributing to her atrial flutter, continue diuresis, continue amiodarone> changing to PO on 4/7, plan for cardioversion next week.  Digoxin added.  Rate better controlled. -Currently on Lasix IV 80 mg every 12.  +18 L since admission.  Also IV fluids being given for acute kidney injury were discontinued 4/6 which should help volume status. -Discussed in detail with RN to assist with strict intake output charting and daily weights.  Sepsis from pneumonia -Cultures negative so far.  Continue antibiotics as below  Right-sided pneumonia with pleural effusion -Currently on cefepime. No evidence of MRSA pneumonia. vancomycin discontinued on 04/04/2017.  No significant pleural effusion as per PCCM -As per CCM follow-up, would treat with cefepime for 5 days and then transition to oral antibiotics for total of 10-14 days depending on resolution of infiltrate. -Has completed  9 days of cefepime thus far.  No culture data to guide.  Discussed with pharmacy and will transition to oral Augmentin and complete total 14 days course. -Follow chest x-ray 4/8.  Leukocytosis -Worsening. Probably from pneumonia/A. fib/hypoxia and lymphoma.  Follow CBCs  closely.  No significant change.  Acute renal failure with metabolic acidosis -Questionable prerenal versus tumor lysis syndrome.  Baseline creatinine less than 1 -Nephrology following.  If renal function worsens, patient might need CVVHD and transfer to ICU.  Monitor strict input and output. -UA was negative for infection and renal ultrasound was negative for hydronephrosis -Currently on intravenous Lasix.  -Creatinine continues to improve but plateaued compared to yesterday at 2.3.  Discontinued bicarbonate drip 4/6. -Replacing potassium, magnesium and calcium aggressively..  Hypokalemia/hypomagnesemia/hypocalcemia -Replace and follow.   Right 3rd nerve palsy and left leg weakness in the setting of B-cell lymphoma -Neurology following.  Status post LP done on 04/03/2017: No malignant cells.  Mixed lymphoid population.  CSF Gram stain and fungal stain negative.  CSF culture pending. -As per my discussion with Dr. Leonel Ramsay, Neurology, patient unable to tolerate performing MRI due to back pain and claustrophobia.  MRI that was done was a limited study and showed no acute findings.  Not candidate for performing under sedation due to concern for landing up on ventilator and unable to wean.  -Neurology follow-up and recommendations detailed in their note 4/6, please refer.  MRA head without acute findings.  No further workup recommended by Neurology.  Discuss with primary Oncologist on 4/8.  Relapsed diffuse B-cell lymphoma -Oncology following. -Overall prognosis is guarded -Discussed with palliative care, now DNI and they will continue to follow.  Overall poor prognosis.  Anemia and thrombocytopenia -Probably from lymphoma and chemotherapy -Transfuse to keep hemoglobin above 7.  Hemoglobin down to 7.1.  Transfused 1 unit PRBC 4/7.  Hemoglobin up from 7.1-7.9.  Continue daily CBCs.  Morbid obesity -Outpatient follow-up  DVT prophylaxis: Heparin drip Code Status: Full Family  Communication: Discussed in detail with patient's spouse at bedside.  Updated care and answered questions. Disposition Plan: Depends on clinical outcome.  Not medically stable for discharge.  Consultants: Neurology/cardiology/PCCM/oncology/palliative care team.  Procedures: TEE/DCCV 04/28/2017 Mild biatrial dilation. Excellent left atrial appendage emptying velocities. No thrombus. LVEF>65%. Mild MR, mild TR. Normal aorta. No pericardial effusion.  Cardioverted1time(s).  Cardioversion with synchronized biphasic120Jshock.  Echo 04/01/17 Study Conclusions  - HPI and indications: Atrial flutter 427.32. - Procedure narrative: Transthoracic echocardiography. Image quality was poor. The study was technically difficult, as a result of body habitus. - Left ventricle: The cavity size was normal. Wall thickness was increased in a pattern of moderate LVH. Systolic function was vigorous. The estimated ejection fraction was 75%. The study is not technically sufficient to allow evaluation of LV diastolic function. - Mitral valve: Calcified annulus. Mildly thickened leaflets . At least mild stenosis. There was trivial regurgitation. Valve area by continuity equation (using LVOT flow): 1.68 cm^2. - Left atrium: Moderately dilated. - Atrial septum: There was increased thickness of the septum, consistent with lipomatous hypertrophy. - Tricuspid valve: There was mild regurgitation. - Pulmonary arteries: PA peak pressure: 32 mm Hg (S). - Inferior vena cava: The vessel was normal in size. The respirophasic diameter changes were in the normal range (>= 50%), consistent with normal central venous pressure.  Impressions:  - Technically difficult study. LVEF 75%, rapid atrial flutter is noted, moderate LVH, normal wall motion, MAC with at least mild stenosis and trivial MR, moderae LAE, mild TR,  RVSP 32 mmHg, normal IVC, no pericardial  effusion.   Antimicrobials:  Cefepime from 03/07/2017 onwards >4/6 Vancomycin from 03/14/2017-04/03/17 Oral Augmentin 4/7 >  Subjective: Patient denies complaints.  Overall feeling much better.  Interviewed and examined with spouse and RN at bedside.  Has been on room air for at least the last 48 hours.  No dyspnea or chest pain reported.  Wants to get out of bed to chair this morning.  Objective: Vitals:   04/08/17 0400 04/08/17 0448 04/08/17 0500 04/08/17 0807  BP: 119/68 113/69 120/77 108/73  Pulse: 77 94 88 95  Resp: 16  20 (!) 0  Temp:    98.2 F (36.8 C)  TempSrc:    Oral  SpO2: 96%  97% 96%  Weight:      Height:        Intake/Output Summary (Last 24 hours) at 04/08/2017 1112 Last data filed at 04/08/2017 0948 Gross per 24 hour  Intake 2545.56 ml  Output 2800 ml  Net -254.44 ml   Filed Weights   03/12/2017 1855 04/03/17 0358  Weight: 115.5 kg (254 lb 10.1 oz) 124.8 kg (275 lb 2.2 oz)    Examination:  General exam: Middle-aged female, moderately built and nourished, chronically ill looking, lying comfortably propped up in bed.  She looks much improved compared to the last 2 days. Respiratory system: Diminished breath sounds in the bases but otherwise clear to auscultation.  No increased work of breathing. cardiovascular system: S1 and S2 heard, irregularly irregular.  No JVD or murmurs.  Trace bilateral leg edema.  Telemetry personally reviewed: Atrial flutter with ventricular rate ranging between 80-100. Gastrointestinal system: Abdomen is morbidly obese, nondistended, soft and nontender. Normal bowel sounds heard.  Stable. Central nervous system: Alert and oriented x2.  Right 3rd cranial nerve deficit.  No other focal neurological deficits. Extremities: Moves all limbs symmetrically.  skin: No rashes, lesions or ulcers Psychiatry: Pleasant and appropriate.  Flat affect.   Data Reviewed: I have personally reviewed following labs and imaging studies  CBC: Recent Labs   Lab 04/10/2017 0500  04/04/17 0917 04/04/17 1620 04/05/17 0733 04/06/17 0649 04/07/17 0236 04/07/17 0540 04/08/17 0306  WBC 33.0*   < > 41.8* 38.9* 47.3* 46.5* 36.1* 35.8* 34.6*  NEUTROABS 25.4*  --  29.7* 28.4*  --  39.1*  --   --   --   HGB 7.7*   < > 8.1* 7.9* 8.1* 7.7* 6.7* 7.1* 7.9*  HCT 23.1*   < > 23.4* 22.5* 23.0* 22.2* 19.3* 20.4* 23.5*  MCV 83.1   < > 80.7 80.9 79.6 78.7 80.1 80.6 82.2  PLT 124*   < > 93* 83* 96* 99* 78* 70* 69*   < > = values in this interval not displayed.   Basic Metabolic Panel: Recent Labs  Lab 04/04/17 1620  04/05/17 0733  04/06/17 0649 04/06/17 1325 04/07/17 0236 04/07/17 0540 04/08/17 0306  NA 138  138   < > 141   < > 141 137 140 140 139  K 4.0  4.0   < > 3.6   < > 3.0* 3.1* 2.5* 3.2* 3.2*  CL 114*  114*   < > 112*   < > 103 103 99* 98* 96*  CO2 13*  13*   < > 16*   < > 21* _0 GLUCOSE 136*  137*   < > 153*   < > 156* 160* 139* 146* 143*  BUN 32*  33*   < >  38*   < > 42* 42* 36* 38* 38*  CREATININE 2.57*  2.57*   < > 2.55*   < > 2.58* 2.49* 2.10* 2.36* 2.33*  CALCIUM 7.8*  7.8*   < > 7.6*   < > 6.7* 6.3* 5.4* 6.1* 5.5*  MG  --   --   --   --  1.5* 1.6* 1.2* 1.4* 1.5*  PHOS <1.0*  --  1.0*  --  1.7*  --  2.2*  --  3.6   < > = values in this interval not displayed.   GFR: Estimated Creatinine Clearance: 29.5 mL/min (A) (by C-G formula based on SCr of 2.33 mg/dL (H)). Liver Function Tests: Recent Labs  Lab 04/04/17 0917 04/04/17 1620 04/05/17 0733 04/06/17 0649 04/07/17 0236 04/08/17 0306  AST 47*  --   --   --   --   --   ALT 23  --   --   --   --   --   ALKPHOS 101  --   --   --   --   --   BILITOT 0.8  --   --   --   --   --   PROT 4.8*  --   --   --   --   --   ALBUMIN 2.4* 2.4* 2.4* 2.3* 1.9* 2.2*   Sepsis Labs: Recent Labs  Lab 04/01/17 1541 04/04/17 2140  LATICACIDVEN 1.9 1.9    Recent Results (from the past 240 hour(s))  Blood Culture (routine x 2)     Status: None   Collection Time: 03/24/2017   1:40 PM  Result Value Ref Range Status   Specimen Description   Final    BLOOD LEFT ANTECUBITAL Performed at [facility], [place] [address], [place], [place] [address]    Special Requests   Final    BOTTLES DRAWN AEROBIC AND ANAEROBIC Blood Culture results may not be optimal due to an inadequate volume of blood received in culture bottles Performed at [facility] [address], [place], [place] [address]    Culture   Final    NO GROWTH 5 DAYS Performed at [facility], [place] [address] [place], [place] [address]    Report Status 04/04/2017 FINAL  Final  MRSA PCR Screening     Status: None   Collection Time: 03/07/2017  6:56 PM  Result Value Ref Range Status   MRSA by PCR NEGATIVE NEGATIVE Final    Comment:        The GeneXpert MRSA Assay (FDA approved for NASAL specimens only), is one component of a comprehensive MRSA colonization surveillance program. It is not intended to diagnose MRSA infection nor to guide or monitor treatment for MRSA infections. Performed at [facility], [place] [address] [place], [place] [address]   Blood Culture (routine x 2)     Status: None   Collection Time: 03/14/2017  7:14 PM  Result Value Ref Range Status   Specimen Description   Final    BLOOD RIGHT HAND Performed at [facility] [address], [place], [place] [address]    Special Requests   Final    AEB BCLV Performed at [facility] [address], [place], [place] [address]    Culture   Final    NO GROWTH 5 DAYS Performed at [facility], [place] [address], [place], [place] [address]    Report Status 04/04/2017  FINAL  Final  Fungus Stain     Status: None   Collection Time: 04/03/17  1:47 PM  Result Value Ref Range Status   FUNGUS STAIN Final report  Final    Comment: (NOTE) Performed At: [facility] [place], [place] [id number] Rush Farmer MD  QI:[id number]    Fungal Source CSF  Final    Comment: Performed at [facility], [place] [address] [place], [place] [address]  Fungal Stain reflex     Status: None   Collection Time: 04/03/17  1:47 PM  Result Value Ref Range Status   Fungal stain result 1 Comment  Final    Comment: (NOTE) KOH/Calcofluor preparation:  no fungus observed. Performed At: [facility] [place], [place] [id number] Rush Farmer MD DJ:[id number] Performed at [facility], [place] [address] [place], [place] [address]   CSF culture     Status: None (Preliminary result)   Collection Time: 04/03/17  1:54 PM  Result Value Ref Range Status   Specimen Description CSF  Final   Special Requests NONE  Final   Gram Stain   Final    NO WBC SEEN NO ORGANISMS SEEN CYTOSPIN SMEAR Gram Stain Report Called to,Read Back By and Verified With: E.MATHAI AT 1618 ON 04/03/17 BY N.THOMPSON Performed at [facility], [place] [address] [place], [place] [address]    Culture PENDING  Incomplete   Report Status PENDING  Incomplete         Radiology Studies: Mr Virgel Paling IN Contrast  Result Date: 04/07/2017 CLINICAL DATA:  Lower extremity weakness. Right third nerve palsy. Sepsis. Progression of lymphoma. EXAM: MRA HEAD WITHOUT CONTRAST TECHNIQUE: Angiographic images of the Circle of Willis were obtained using MRA technique without intravenous contrast. COMPARISON:  04/05/2017 FINDINGS: Brain: Brain has normal appearance without evidence of malformation, atrophy, old or acute small or large vessel infarction, mass lesion, hemorrhage, hydrocephalus or extra-axial collection. Vascular: Major vessels at the base of the brain show flow. Venous sinuses appear patent. Skull and upper cervical spine: Normal. Sinuses/Orbits: Clear/normal. Other: None significant. IMPRESSION: Normal intracranial MR angiography of the large and medium size vessels. Electronically Signed    By: Nelson Chimes M.D.   On: 04/07/2017 17:07        Scheduled Meds: . allopurinol  100 mg Oral BID  . amiodarone  400 mg Oral BID  . calcium carbonate  500 mg of elemental calcium Oral TID WC  . Chlorhexidine Gluconate Cloth  6 each Topical Daily  . feeding supplement  1 Container Oral BID BM  . furosemide  80 mg Intravenous Q12H  . methylPREDNISolone (SOLU-MEDROL) injection  40 mg Intravenous Q12H  . metoprolol tartrate  25 mg Oral Q6H  . potassium chloride  40 mEq Oral TID  . senna  2 tablet Oral QHS  . sodium chloride flush  10-40 mL Intracatheter Q12H  . sodium chloride flush  3 mL Intravenous Q12H   Continuous Infusions: . sodium chloride 10 mL/hr at 04/07/17 1600  . ceFEPime (MAXIPIME) IV Stopped (04/07/17 2322)  . diltiazem (CARDIZEM) infusion 10 mg/hr (04/08/17 0808)  . heparin 900 Units/hr (04/08/17 8676)     LOS: 9 days        Vernell Leep, MD, FACP, [facility]. [facility] Pager [phone]  If 7PM-7AM, please contact night-coverage [email or web address] Password [email or web address] 04/08/2017, 11:12 AM

## 2017-04-08 NOTE — Progress Notes (Signed)
[place] for heparin Indication: atrial fibrillation  No Known Allergies  Patient Measurements: Height: 5\' 1"  (154.9 cm) Weight: 275 lb 2.2 oz (124.8 kg) IBW/kg (Calculated) : 47.8 Heparin Dosing Weight: 76 kg  Vital Signs: Temp: 98.4 F (36.9 C) (04/07 1115) Temp Source: Oral (04/07 1115) BP: 107/59 (04/07 1115) Pulse Rate: 101 (04/07 1115)  Labs: Recent Labs    04/07/17 0236  04/07/17 0540 04/07/17 1418 04/08/17 0306 04/08/17 1155  HGB 6.7*  --  7.1*  --  7.9*  --   HCT 19.3*  --  20.4*  --  23.5*  --   PLT 78*  --  70*  --  69*  --   HEPARINUNFRC  --    < >  --  0.79* 0.67 0.53  CREATININE 2.10*  --  2.36*  --  2.33*  --    < > = values in this interval not displayed.     Assessment: Patient is a 67 y.o. F with hx lymphoma currently undergoing chemotherapy treatment presented to the ED on 03/08/2017 with afib with RVR s/p s/p DCCV on 4/1 but converted back to afib on amio 400 mg bid and dilt drip 10mg /hr She's currently on heparin drip 900 uts/hr HL 0.5 at goal   -plt= 70 ( lymphoma with recent chemo) H/h improved after prbc yesterday    Goal of Therapy:  Heparin level 0.3-0.7 units/ml Monitor platelets by anticoagulation protocol: Yes   Plan Continue  heparin to 900 units/hr - Daily heparin level and CBC   Bonnita Nasuti Pharm.D. CPP, BCPS Clinical Pharmacist [phone] 04/08/2017 2:59 PM

## 2017-04-08 NOTE — Progress Notes (Signed)
Progress Note  Patient Name: Jo Collins Date of Encounter: 04/08/2017  Primary Cardiologist: No primary care provider on file.   Subjective   She remains in atrial flutter today.  No acute complaints.  She is feeling better today with more energy.  Inpatient Medications    Scheduled Meds: . allopurinol  100 mg Oral BID  . calcium carbonate  500 mg of elemental calcium Oral TID WC  . Chlorhexidine Gluconate Cloth  6 each Topical Daily  . digoxin  0.125 mg Intravenous Daily  . feeding supplement  1 Container Oral BID BM  . furosemide  80 mg Intravenous Q12H  . methylPREDNISolone (SOLU-MEDROL) injection  40 mg Intravenous Q12H  . metoprolol tartrate  25 mg Oral Q6H  . potassium chloride  40 mEq Oral TID  . senna  2 tablet Oral QHS  . sodium chloride flush  10-40 mL Intracatheter Q12H  . sodium chloride flush  3 mL Intravenous Q12H   Continuous Infusions: . sodium chloride 10 mL/hr at 04/07/17 1600  . amiodarone 30 mg/hr (04/08/17 0533)  . ceFEPime (MAXIPIME) IV Stopped (04/07/17 2322)  . diltiazem (CARDIZEM) infusion 10 mg/hr (04/08/17 0406)  . heparin 900 Units/hr (04/08/17 0406)  . magnesium sulfate 1 - 4 g bolus IVPB     PRN Meds: acetaminophen, fentaNYL (SUBLIMAZE) injection, levalbuterol, ondansetron (ZOFRAN) IV, sodium chloride flush, sodium chloride flush, traZODone   Vital Signs    Vitals:   04/08/17 0400 04/08/17 0448 04/08/17 0500 04/08/17 0807  BP: 119/68 113/69 120/77 108/73  Pulse: 77 94 88 95  Resp: 16  20 (!) 0  Temp:    98.2 F (36.8 C)  TempSrc:    Oral  SpO2: 96%  97% 96%  Weight:      Height:        Intake/Output Summary (Last 24 hours) at 04/08/2017 0905 Last data filed at 04/08/2017 3557 Gross per 24 hour  Intake 2301.57 ml  Output 2800 ml  Net -498.43 ml   Filed Weights   03/06/2017 1855 04/03/17 0358  Weight: 254 lb 10.1 oz (115.5 kg) 275 lb 2.2 oz (124.8 kg)    Telemetry    Atrial flutter with variable block- Personally  Reviewed  ECG    None new- Personally Reviewed  Physical Exam   GEN: Morbidly obese, in no acute distress  HEENT: normal  Neck: no JVD, carotid bruits, or masses Cardiac: iRRR; no murmurs, rubs, or gallops,no edema  Respiratory:  clear to auscultation bilaterally, normal work of breathing GI: soft, nontender, nondistended, + BS MS: no deformity or atrophy  Skin: warm and dry Neuro:  Strength and sensation are intact Psych: euthymic mood, full affect   Labs    Chemistry Recent Labs  Lab 04/04/17 0917  04/06/17 0649  04/07/17 0236 04/07/17 0540 04/08/17 0306  NA 140   < > 141   < > 140 140 139  K 3.8   < > 3.0*   < > 2.5* 3.2* 3.2*  CL 116*   < > 103   < > 99* 98* 96*  CO2 13*   < > 21*   < > 27 27 27   GLUCOSE 94   < > 156*   < > 139* 146* 143*  BUN 30*   < > 42*   < > 36* 38* 38*  CREATININE 2.41*   < > 2.58*   < > 2.10* 2.36* 2.33*  CALCIUM 8.0*   < > 6.7*   < >  5.4* 6.1* 5.5*  PROT 4.8*  --   --   --   --   --   --   ALBUMIN 2.4*   < > 2.3*  --  1.9*  --  2.2*  AST 47*  --   --   --   --   --   --   ALT 23  --   --   --   --   --   --   ALKPHOS 101  --   --   --   --   --   --   BILITOT 0.8  --   --   --   --   --   --   GFRNONAA 20*   < > 18*   < > 23* 20* 21*  GFRAA 23*   < > 21*   < > 27* 24* 24*  ANIONGAP 11   < > 17*   < > 14 15 16*   < > = values in this interval not displayed.     Hematology Recent Labs  Lab 04/07/17 0236 04/07/17 0540 04/08/17 0306  WBC 36.1* 35.8* 34.6*  RBC 2.41* 2.53* 2.86*  HGB 6.7* 7.1* 7.9*  HCT 19.3* 20.4* 23.5*  MCV 80.1 80.6 82.2  MCH 27.8 28.1 27.6  MCHC 34.7 34.8 33.6  RDW 20.5* 20.7* 19.3*  PLT 78* 70* 69*    Cardiac EnzymesNo results for input(s): TROPONINI in the last 168 hours. No results for input(s): TROPIPOC in the last 168 hours.   BNPNo results for input(s): BNP, PROBNP in the last 168 hours.   DDimer No results for input(s): DDIMER in the last 168 hours.   Radiology    Mr Jodene Nam Head Wo  Contrast  Result Date: 04/07/2017 CLINICAL DATA:  Lower extremity weakness. Right third nerve palsy. Sepsis. Progression of lymphoma. EXAM: MRA HEAD WITHOUT CONTRAST TECHNIQUE: Angiographic images of the Circle of Willis were obtained using MRA technique without intravenous contrast. COMPARISON:  04/05/2017 FINDINGS: Brain: Brain has normal appearance without evidence of malformation, atrophy, old or acute small or large vessel infarction, mass lesion, hemorrhage, hydrocephalus or extra-axial collection. Vascular: Major vessels at the base of the brain show flow. Venous sinuses appear patent. Skull and upper cervical spine: Normal. Sinuses/Orbits: Clear/normal. Other: None significant. IMPRESSION: Normal intracranial MR angiography of the large and medium size vessels. Electronically Signed   By: Nelson Chimes M.D.   On: 04/07/2017 17:07    Cardiac Studies   Echo 04/01/17 Study Conclusions  - HPI and indications: Atrial flutter 427.32. - Procedure narrative: Transthoracic echocardiography. Image quality was poor. The study was technically difficult, as a result of body habitus. - Left ventricle: The cavity size was normal. Wall thickness was increased in a pattern of moderate LVH. Systolic function was vigorous. The estimated ejection fraction was 75%. The study is not technically sufficient to allow evaluation of LV diastolic function. - Mitral valve: Calcified annulus. Mildly thickened leaflets . At least mild stenosis. There was trivial regurgitation. Valve area by continuity equation (using LVOT flow): 1.68 cm^2. - Left atrium: Moderately dilated. - Atrial septum: There was increased thickness of the septum, consistent with lipomatous hypertrophy. - Tricuspid valve: There was mild regurgitation. - Pulmonary arteries: PA peak pressure: 32 mm Hg (S). - Inferior vena cava: The vessel was normal in size. The respirophasic diameter changes were in the normal range (>=  50%), consistent with normal central venous pressure.  Impressions:  - Technically difficult study.  LVEF 75%, rapid atrial flutter is noted, moderate LVH, normal wall motion, MAC with at least mild stenosis and trivial MR, moderae LAE, mild TR, RVSP 32 mmHg, normal IVC, no pericardial effusion.  TEE/DCCV 04/30/2017 FINDINGS: Mild biatrial dilation. Excellent left atrial appendage emptying velocities. No thrombus. LVEF>65%. Mild MR, mild TR. Normal aorta. No pericardial effusion.  Cardioverted1time(s).  Cardioversion with synchronized biphasic120Jshock.    Patient Profile     67 y.o. female with history of diffuse large B-cell lymphoma, sciatica, GERD, morbid obesity who presented with acute renal failure, pancytopenia and concern for pneumonia and sepsis. Underwent cardioversion for atrial flutter with rapid ventricular response on April 1, with return to atrial fibrillation roughly 16 hours later, now back in atrial flutter with 2: 1 AV block.  Assessment & Plan    1. AFlutter: Unfortunately she has few further options from a we are doing.  Of note she is quite volume overloaded.  Diuresis may improve her rate control and make it easier for cardioversion.  Would continue her on amiodarone.  We Krissi Willaims plan to switch her to p.o. today.  Rosslyn Pasion potentially plan for cardioversion later this week.    2. ARF: Remained stable over 24 hours.  Potassium repletion per primary team.  3.Acute respiratory failure with hypoxia: Does not appear to be consistent with heart failure.  She is quite volume overloaded though.  Would benefit from diuresis as her blood pressure is able to tolerate.   4. 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  Morbid obesity   For questions or updates, please contact [place] Please consult [email or web address] for contact info under Cardiology/STEMI.      Signed, Cathan Gearin Meredith Leeds, MD  04/08/2017, 9:05 AM

## 2017-04-08 NOTE — Progress Notes (Signed)
ANTICOAGULATION CONSULT NOTE - Follow Up Consult  Pharmacy Consult for heparin Indication: atrial fibrillation  Labs: Recent Labs    04/06/17 1325 04/07/17 0236 04/07/17 0242 04/07/17 0540 04/07/17 1418 04/08/17 0306  HGB  --  6.7*  --  7.1*  --  7.9*  HCT  --  19.3*  --  20.4*  --  23.5*  PLT  --  78*  --  70*  --  69*  HEPARINUNFRC  --   --  0.79*  --  0.79* 0.67  CREATININE 2.49* 2.10*  --  2.36*  --   --     Assessment: 67yo female now therapeutic on heparin but at very upper end of goal; Hgb up after tranfusion, RN states that pt subjectively looks better w/ no overt signs of bleeding.  Goal of Therapy:  Heparin level 0.3-0.7 units/ml   Plan:  Will decrease heparin gtt slight;y to 900 units/hr to prevent from accumulating to above goal and check level in 8 hours.    Wynona Neat, PharmD, BCPS  04/08/2017,3:52 AM

## 2017-04-08 NOTE — Progress Notes (Signed)
Patient tolerated OOB in chair for 2 hours today.

## 2017-04-08 NOTE — Progress Notes (Addendum)
S:Feels better O:BP 108/73 (BP Location: Right Leg)   Pulse 95   Temp 98.2 F (36.8 C) (Oral)   Resp (!) 0   Ht 5\' 1"  (1.549 m)   Wt 124.8 kg (275 lb 2.2 oz)   SpO2 96%   BMI 51.99 kg/m   Intake/Output Summary (Last 24 hours) at 04/08/2017 1106 Last data filed at 04/08/2017 0948 Gross per 24 hour  Intake 2545.56 ml  Output 2800 ml  Net -254.44 ml   Intake/Output: I/O last 3 completed shifts: In: 9165.8 [P.O.:800; I.V.:7075.8; Blood:330; IV Piggyback:960] Out: 8676 [Urine:4600]  Intake/Output this shift:  Total I/O In: 244 [I.V.:144; IV Piggyback:100] Out: 0  Weight change:  PPJ:KDTOIZT comfortably CVS: tachy Resp: cta anteriorly  Abd:+BS, soft Ext: 1+ edema on left trace on right  Recent Labs  Lab 04/04/17 0917 04/04/17 1620  04/05/17 0733 04/05/17 1656 04/05/17 2129 04/06/17 0649 04/06/17 1325 04/07/17 0236 04/07/17 0540 04/08/17 0306  NA 140 138  138   < > 141 142 140 141 137 140 140 139  K 3.8 4.0  4.0   < > 3.6 3.2* 3.3* 3.0* 3.1* 2.5* 3.2* 3.2*  CL 116* 114*  114*   < > 112* 110 108 103 103 99* 98* 96*  CO2 13* 13*  13*   < > 16* 18* 19* 21* 22 27 27 27   GLUCOSE 94 136*  137*   < > 153* 168* 176* 156* 160* 139* 146* 143*  BUN 30* 32*  33*   < > 38* 40* 41* 42* 42* 36* 38* 38*  CREATININE 2.41* 2.57*  2.57*   < > 2.55* 2.60* 2.60* 2.58* 2.49* 2.10* 2.36* 2.33*  ALBUMIN 2.4* 2.4*  --  2.4*  --   --  2.3*  --  1.9*  --  2.2*  CALCIUM 8.0* 7.8*  7.8*   < > 7.6* 7.4* 7.1* 6.7* 6.3* 5.4* 6.1* 5.5*  PHOS  --  <1.0*  --  1.0*  --   --  1.7*  --  2.2*  --  3.6  AST 47*  --   --   --   --   --   --   --   --   --   --   ALT 23  --   --   --   --   --   --   --   --   --   --    < > = values in this interval not displayed.   Liver Function Tests: Recent Labs  Lab 04/04/17 0917  04/06/17 0649 04/07/17 0236 04/08/17 0306  AST 47*  --   --   --   --   ALT 23  --   --   --   --   ALKPHOS 101  --   --   --   --   BILITOT 0.8  --   --   --   --   PROT  4.8*  --   --   --   --   ALBUMIN 2.4*   < > 2.3* 1.9* 2.2*   < > = values in this interval not displayed.   No results for input(s): LIPASE, AMYLASE in the last 168 hours. No results for input(s): AMMONIA in the last 168 hours. CBC: Recent Labs  Lab 04/04/17 0917 04/04/17 1620 04/05/17 0733 04/06/17 0649 04/07/17 0236 04/07/17 0540 04/08/17 0306  WBC 41.8* 38.9* 47.3* 46.5* 36.1* 35.8* 34.6*  NEUTROABS 29.7*  28.4*  --  39.1*  --   --   --   HGB 8.1* 7.9* 8.1* 7.7* 6.7* 7.1* 7.9*  HCT 23.4* 22.5* 23.0* 22.2* 19.3* 20.4* 23.5*  MCV 80.7 80.9 79.6 78.7 80.1 80.6 82.2  PLT 93* 83* 96* 99* 78* 70* 69*   Cardiac Enzymes: No results for input(s): CKTOTAL, CKMB, CKMBINDEX, TROPONINI in the last 168 hours. CBG: No results for input(s): GLUCAP in the last 168 hours.  Iron Studies: No results for input(s): IRON, TIBC, TRANSFERRIN, FERRITIN in the last 72 hours. Studies/Results: Mr Virgel Paling [facility] Contrast  Result Date: 04/07/2017 CLINICAL DATA:  Lower extremity weakness. Right third nerve palsy. Sepsis. Progression of lymphoma. EXAM: MRA HEAD WITHOUT CONTRAST TECHNIQUE: Angiographic images of the Circle of Willis were obtained using MRA technique without intravenous contrast. COMPARISON:  04/05/2017 FINDINGS: Brain: Brain has normal appearance without evidence of malformation, atrophy, old or acute small or large vessel infarction, mass lesion, hemorrhage, hydrocephalus or extra-axial collection. Vascular: Major vessels at the base of the brain show flow. Venous sinuses appear patent. Skull and upper cervical spine: Normal. Sinuses/Orbits: Clear/normal. Other: None significant. IMPRESSION: Normal intracranial MR angiography of the large and medium size vessels. Electronically Signed   By: Nelson Chimes M.D.   On: 04/07/2017 17:07   . allopurinol  100 mg Oral BID  . amiodarone  400 mg Oral BID  . calcium carbonate  500 mg of elemental calcium Oral TID WC  . Chlorhexidine Gluconate Cloth  6 each  Topical Daily  . feeding supplement  1 Container Oral BID BM  . furosemide  80 mg Intravenous Q12H  . methylPREDNISolone (SOLU-MEDROL) injection  40 mg Intravenous Q12H  . metoprolol tartrate  25 mg Oral Q6H  . potassium chloride  40 mEq Oral TID  . senna  2 tablet Oral QHS  . sodium chloride flush  10-40 mL Intracatheter Q12H  . sodium chloride flush  3 mL Intravenous Q12H    BMET    Component Value Date/Time   NA 139 04/08/2017 0306   NA 143 12/07/2016 0823   K 3.2 (L) 04/08/2017 0306   K 3.5 12/07/2016 0823   CL 96 (L) 04/08/2017 0306   CO2 27 04/08/2017 0306   CO2 24 12/07/2016 0823   GLUCOSE 143 (H) 04/08/2017 0306   GLUCOSE 94 12/07/2016 0823   BUN 38 (H) 04/08/2017 0306   BUN 9.3 12/07/2016 0823   CREATININE 2.33 (H) 04/08/2017 0306   CREATININE 2.48 (H) 03/09/2017 1038   CREATININE 0.7 12/07/2016 0823   CALCIUM 5.5 (LL) 04/08/2017 0306   CALCIUM 8.9 12/07/2016 0823   GFRNONAA 21 (L) 04/08/2017 0306   GFRNONAA 19 (L) 03/21/2017 1038   GFRAA 24 (L) 04/08/2017 0306   GFRAA 22 (L) 03/22/2017 1038   CBC    Component Value Date/Time   WBC 34.6 (H) 04/08/2017 0306   RBC 2.86 (L) 04/08/2017 0306   HGB 7.9 (L) 04/08/2017 0306   HGB 8.8 (L) 12/07/2016 0823   HCT 23.5 (L) 04/08/2017 0306   HCT 27.9 (L) 12/07/2016 0823   PLT 69 (L) 04/08/2017 0306   PLT 16 (L) 03/16/2017 0753   PLT 213 12/07/2016 0823   MCV 82.2 04/08/2017 0306   MCV 90.9 12/07/2016 0823   MCH 27.6 04/08/2017 0306   MCHC 33.6 04/08/2017 0306   RDW 19.3 (H) 04/08/2017 0306   RDW 15.6 (H) 12/07/2016 0823   LYMPHSABS 2.3 04/06/2017 0649   LYMPHSABS 0.4 (L) 12/07/2016 8850  MONOABS 1.4 (H) 04/06/2017 0649   MONOABS 0.6 12/07/2016 0823   EOSABS 0.0 04/06/2017 0649   EOSABS 0.0 12/07/2016 0823   BASOSABS 0.0 04/06/2017 0649   BASOSABS 0.0 12/07/2016 0823     Assessment/Plan: 1. AKI- in setting of refractory, large burden, diffuse large B cell lymphoma on chemo as well as pneumonia c/b  Afib/flutter with RVR and hypotension.Doubttumor lysis syndromegiven hypophosphatemia. More consistent with ischemic ATN in setting of volume depletion, tachycardia, relativehypotensive and acute illness. Urine output is picking up and creatinine stabilizing. 1. Overall prognosis is poor and recommend palliative care consult to help set goals/limits of care.  2. No indication for dialysis and would not offer given progressive lymphoma despite multiple chemotherapy regimens 2. Respiratory distress with acute hypoxemic respiratory failure- currently offBiPap and breathing much more comfortably, family did decide no intubation 3. Right mid and lower lung consolidation consistent with HCAP. abx per primary team 4. Hypokalemia- will replete with po KCl per Dr. Algis Liming. 5. Hypomagnesemia- d/w dr. Carson Myrtle, will give 2 gram IV yesterday and to get 4 grams today, continue to follow levels 6. Metabolic acidosis due to #1- agree with stopping isotonic bicarb drip and hold off on further IVF's for now but need to follow her intake 7. Refractory DLBCL despite multiple chemo regimens. Prognosis is grim and appears to be progressive despite chemo 8. Hypophosphatemia- likely due to poor po intake doubt Fanconi syndrome due to lack of glucosuria. Repleted and with K phos with improvement.  Continue to follow and replete as needed.  9. 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 Thrombocytopenia- due to malignancy 11. Severe protein and caloric malnutrition 12. A flutter with RVR failed DCCV and now onamio andcardizem drip with evidence of pulmonary edema and diastolic heart failure. Cardiology following and agree that improving potassium level might help with tachycardia. EP evaluated pt and no good options due her overall condition. 13. 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 14. Disposition- overall prognosis is poor. Appreciate  palliative care consult to help set goal/limits of care as she is currently a full code and probability of survival is low. Consider transitioning to comfort care.  Would also ask her primary oncologist to discuss prognosis with family.  Jo Potts, MD [employer] [phone]

## 2017-04-09 ENCOUNTER — Inpatient Hospital Stay (HOSPITAL_COMMUNITY): Payer: Medicare Other

## 2017-04-09 DIAGNOSIS — N181 Chronic kidney disease, stage 1: Secondary | ICD-10-CM

## 2017-04-09 LAB — CBC
HEMATOCRIT: 25 % — AB (ref 36.0–46.0)
HEMOGLOBIN: 8.4 g/dL — AB (ref 12.0–15.0)
MCH: 28.4 pg (ref 26.0–34.0)
MCHC: 33.6 g/dL (ref 30.0–36.0)
MCV: 84.5 fL (ref 78.0–100.0)
Platelets: 63 10*3/uL — ABNORMAL LOW (ref 150–400)
RBC: 2.96 MIL/uL — ABNORMAL LOW (ref 3.87–5.11)
RDW: 19.7 % — ABNORMAL HIGH (ref 11.5–15.5)
WBC: 32.6 10*3/uL — AB (ref 4.0–10.5)

## 2017-04-09 LAB — RENAL FUNCTION PANEL
Albumin: 2.4 g/dL — ABNORMAL LOW (ref 3.5–5.0)
Anion gap: 14 (ref 5–15)
BUN: 35 mg/dL — ABNORMAL HIGH (ref 6–20)
CALCIUM: 5.9 mg/dL — AB (ref 8.9–10.3)
CO2: 29 mmol/L (ref 22–32)
CREATININE: 2.15 mg/dL — AB (ref 0.44–1.00)
Chloride: 96 mmol/L — ABNORMAL LOW (ref 101–111)
GFR, EST AFRICAN AMERICAN: 26 mL/min — AB (ref >60)
GFR, EST NON AFRICAN AMERICAN: 23 mL/min — AB (ref >60)
Glucose, Bld: 122 mg/dL — ABNORMAL HIGH (ref 65–99)
Phosphorus: 2.7 mg/dL (ref 2.5–4.6)
Potassium: 3.4 mmol/L — ABNORMAL LOW (ref 3.5–5.1)
SODIUM: 139 mmol/L (ref 135–145)

## 2017-04-09 LAB — HEPARIN LEVEL (UNFRACTIONATED): Heparin Unfractionated: 0.59 IU/mL (ref 0.30–0.70)

## 2017-04-09 LAB — MAGNESIUM: MAGNESIUM: 1.6 mg/dL — AB (ref 1.7–2.4)

## 2017-04-09 MED ORDER — PREDNISONE 20 MG PO TABS
40.0000 mg | ORAL_TABLET | Freq: Every day | ORAL | Status: DC
  Administered 2017-04-10 – 2017-04-12 (×3): 40 mg via ORAL
  Filled 2017-04-09 (×3): qty 2

## 2017-04-09 MED ORDER — MAGNESIUM SULFATE 2 GM/50ML IV SOLN
2.0000 g | Freq: Once | INTRAVENOUS | Status: AC
  Administered 2017-04-09: 2 g via INTRAVENOUS
  Filled 2017-04-09: qty 50

## 2017-04-09 MED ORDER — AMILORIDE HCL 5 MG PO TABS
5.0000 mg | ORAL_TABLET | Freq: Every day | ORAL | Status: DC
  Administered 2017-04-09 – 2017-04-11 (×3): 5 mg via ORAL
  Filled 2017-04-09 (×4): qty 1

## 2017-04-09 MED ORDER — MAGNESIUM GLUCONATE 500 MG PO TABS: 500.0000 mg | ORAL_TABLET | Freq: Two times a day (BID) | ORAL | Status: DC

## 2017-04-09 MED ORDER — FUROSEMIDE 80 MG PO TABS
80.0000 mg | ORAL_TABLET | Freq: Two times a day (BID) | ORAL | Status: DC
  Administered 2017-04-09 – 2017-04-11 (×5): 80 mg via ORAL
  Filled 2017-04-09 (×7): qty 1

## 2017-04-09 MED ORDER — MAGNESIUM OXIDE 400 (241.3 MG) MG PO TABS
400.0000 mg | ORAL_TABLET | Freq: Every day | ORAL | Status: DC
  Administered 2017-04-09 – 2017-04-11 (×3): 400 mg via ORAL
  Filled 2017-04-09 (×3): qty 1

## 2017-04-09 NOTE — Progress Notes (Addendum)
Patient ID: Jo Collins, female   DOB: Jul 02, 1950, 67 y.o.   MRN: [id number]  PROGRESS NOTE    Jo Collins  NWG:[id number] DOB: November 21, 1950 DOA: 03/20/2017 PCP: Deland Pretty, MD   Brief Narrative:  67 year old female with history of paroxysmal atrial fibrillation not on anticoagulation, large B-cell lymphoma with ongoing chemotherapy and recent tumor lysis syndrome resulting in acute renal failure anemia and hospitalization, morbid obesity, chronic back pain, prior biopsy of the right iliac crest in January and ongoing issues with low back pain with radiation down to the left leg with weakness and numbness was admitted on 03/17/2017 with complaints of leg pain, diplopia.  She was admitted for A. fib with RVR and possible sepsis from pneumonia.  She was started on antibiotics.  Cardiology was consulted.  She had a TEE and cardioversion on 04/24/2017 but then converted back to A. fib.  Neurology was also consulted for neurological symptoms an MRI was ordered.  MRI could not be done at [facility] hospital so she was transferred to [facility] on 04/03/2017.  She had LP done on 04/29/2017.  Oncology is also following.  Nephrology was consulted for worsening renal function and acidosis. PCCM was reconsulted for worsening hypoxia and have signed off.  Hypoxia resolved.  Ongoing issues with atrial flutter with RVR, remains on IV Cardizem drip, cardiology plans repeat cardioversion this week.  Pneumonia improving and transitioned to oral antibiotics.  Acute kidney injury improved and nephrology signed off 4/8.  Replacing electrolytes.  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  Palliative involved, DNI established.  Assessment & Plan:   Active Problems:   NHL (non-Hodgkin's lymphoma) (HCC)   Morbid obesity with BMI of 50.0-59.9, adult (HCC)   Diffuse large B cell lymphoma (HCC)   Recurrent right pleural effusion   Pleural effusion  Atrial fibrillation with tachycardic ventricular rate (HCC)   Atrial flutter (HCC)   Sepsis (HCC)   Leukocytosis   Acute on chronic renal failure (HCC)   HCAP (healthcare-associated pneumonia)   3rd nerve palsy, complete, right   Hyperuricemia   Acute respiratory failure with hypoxia (HCC)   AKI (acute kidney injury) ([place])   Palliative care encounter   DNI (do not intubate)   Counseling regarding advance care planning and goals of care   Acute hypoxic respiratory failure  - Probably from combination of pneumonia/volume overload/A. fib with RVR -Had been on BiPAP (off since 4/4).  Hypoxia resolved and has been on room air for several days now.  Palliative consulted and DNI established. -Changed IV Solu-Medrol to PO prednisone 40 mg daily 4/9 and taper down.  Atrial flutter with RVR - Underwent TEE and DCCV on 04/17/2017 but reverted to atrial flutter with RVR. -Currently on IV Cardizem and heparin drip.  Amiodarone infusion changed to oral on 4/7.  Did receive couple doses of IV digoxin but not continued.  Aggressively replacing multiple electrolyte deficiencies. - Not on anticoagulation as an outpatient.  - IV Lasix changed to oral 80 mg twice daily and amiloride added by nephrology 4/8. - Cardiology follow-up appreciated & considering repeating cardioversion this week.  Sepsis from pneumonia - Cultures negative so far.  Complete course of antibiotics as below.  Sepsis resolved.  Right-sided pneumonia with pleural effusion - Vancomycin discontinued on 04/04/2017.   - Completed 9 days of cefepime.  No culture data to guide. Transitioned to oral Augmentin 4/8 and complete total 14 days course. -Repeat chest x-ray  4/8 shows persisting right lower lobe opacity.  Leukocytosis - Probably from pneumonia/A. fib/hypoxia and lymphoma.  Slowly improving.  Monitor.  Acute renal failure with metabolic acidosis -Nephrology was consulted.  Etiology felt to be ischemic ATN-multifactorial.   Nonoliguric.  Briefly on IV fluids/bicarbonate drip, now off.  Briefly on IV Lasix. - UA was negative for infection and renal ultrasound was negative for hydronephrosis -Creatinine gradually improving.  Discussed with nephrology who have started on amiloride to help with hypokalemia/hypomagnesemia and have switched Lasix to oral. -Nephrology signed off 4/8.  Hypokalemia/hypomagnesemia/hypocalcemia -Replace and follow.   Right 3rd nerve palsy and left leg weakness in the setting of B-cell lymphoma - Status post LP done on 04/03/2017: No malignant cells.  Mixed lymphoid population.  CSF Gram stain and fungal stain negative.  CSF culture pending. -As per my discussion with Dr. Leonel Ramsay, Neurology, patient unable to tolerate performing MRI due to back pain and claustrophobia.  MRI that was done was a limited study and showed no acute findings.  Not candidate for performing under sedation due to concern for landing up on ventilator and unable to wean.  -Neurology follow-up and recommendations detailed in their note 4/6, please refer.  MRA head without acute findings.  No further workup recommended by Neurology.   -I discussed with patient's primary oncologist Dr. Irene Limbo on 4/8.  He indicated that patient has received chemotherapy with CNS prophylaxis.  Her systemic disease has been refractory to systemic therapy.  She has overall poor prognosis.  Relapsed diffuse B-cell lymphoma -Oncology following. -Overall prognosis is guarded -Discussed with palliative care, now DNI and they will continue to follow.  Overall poor prognosis.  Anemia and thrombocytopenia -Probably from lymphoma and chemotherapy -Transfuse to keep hemoglobin above 7.  Hemoglobin down to 7.1.  Transfused 1 unit PRBC 4/7.  Hemoglobin up to 8.4.  Platelet counts gradually drifting down/63 today.  No bleeding reported.  Continue daily CBCs.  Morbid obesity -Outpatient follow-up  DVT prophylaxis: Heparin drip Code Status:  DNI/partial Family Communication: Discussed in detail with patient's spouse at bedside.  Updated care and answered questions. Disposition Plan: Depends on clinical outcome.  Not medically stable for discharge.  Consultants: Neurology/cardiology/PCCM/oncology/palliative care team.  Procedures: TEE/DCCV 04/13/2017 Mild biatrial dilation. Excellent left atrial appendage emptying velocities. No thrombus. LVEF>65%. Mild MR, mild TR. Normal aorta. No pericardial effusion.  Cardioverted1time(s).  Cardioversion with synchronized biphasic120Jshock.  Echo 04/01/17 Study Conclusions  - HPI and indications: Atrial flutter 427.32. - Procedure narrative: Transthoracic echocardiography. Image quality was poor. The study was technically difficult, as a result of body habitus. - Left ventricle: The cavity size was normal. Wall thickness was increased in a pattern of moderate LVH. Systolic function was vigorous. The estimated ejection fraction was 75%. The study is not technically sufficient to allow evaluation of LV diastolic function. - Mitral valve: Calcified annulus. Mildly thickened leaflets . At least mild stenosis. There was trivial regurgitation. Valve area by continuity equation (using LVOT flow): 1.68 cm^2. - Left atrium: Moderately dilated. - Atrial septum: There was increased thickness of the septum, consistent with lipomatous hypertrophy. - Tricuspid valve: There was mild regurgitation. - Pulmonary arteries: PA peak pressure: 32 mm Hg (S). - Inferior vena cava: The vessel was normal in size. The respirophasic diameter changes were in the normal range (>= 50%), consistent with normal central venous pressure.  Impressions:  - Technically difficult study. LVEF 75%, rapid atrial flutter is noted, moderate LVH, normal wall motion, MAC with at least mild stenosis and trivial  MR, moderae LAE, mild TR, RVSP 32 mmHg, normal IVC, no pericardial  effusion.   Antimicrobials:  Cefepime from 03/04/2017 onwards >4/6 Vancomycin from 03/16/2017-04/03/17 Oral Augmentin 4/7 >  Subjective: Patient was interviewed with spouse and RN at bedside.  Denies complaints.  Indicates that she was up in chair for a couple of hours yesterday.  No dyspnea or chest pain reported.  As per RN, no acute issues.  Objective: Vitals:   04/09/17 0250 04/09/17 0500 04/09/17 0746 04/09/17 1130  BP: (!) 112/55  (!) 114/56 (!) 124/57  Pulse: 69  93 (!) 108  Resp:   14 18  Temp:   97.9 F (36.6 C) 98 F (36.7 C)  TempSrc:   Oral Oral  SpO2:   96% 97%  Weight:  (!) 161.5 kg (356 lb)    Height:        Intake/Output Summary (Last 24 hours) at 04/09/2017 1529 Last data filed at 04/09/2017 1500 Gross per 24 hour  Intake 873 ml  Output 851 ml  Net 22 ml   Filed Weights   03/18/2017 1855 04/03/17 0358 04/09/17 0500  Weight: 115.5 kg (254 lb 10.1 oz) 124.8 kg (275 lb 2.2 oz) (!) 161.5 kg (356 lb)    Examination:  General exam: Middle-aged female, moderately built and nourished, chronically ill looking, lying comfortably propped up in bed.  She does not appear in any distress.  Noted in good spirits. Respiratory system: Diminished breath sounds in the bases but otherwise clear to auscultation.  No increased work of breathing.  Stable without change. cardiovascular system: S1 and S2 heard, irregularly irregular.  No JVD or murmurs.  Trace bilateral leg edema.  Telemetry personally reviewed: Atrial flutter with variable block, controlled rate.  Gastrointestinal system: Abdomen is morbidly obese, nondistended, soft and nontender. Normal bowel sounds heard.  Stable. Central nervous system: Alert and oriented x2.  Right 3rd cranial nerve deficit.  No other focal neurological deficits. Extremities: Moves all limbs symmetrically. Skin: No rashes, lesions or ulcers Psychiatry: Pleasant and appropriate.  Flat affect.   Data Reviewed: I have personally reviewed following  labs and imaging studies  CBC: Recent Labs  Lab 04/04/17 0917 04/04/17 1620  04/06/17 0649 04/07/17 0236 04/07/17 0540 04/08/17 0306 04/09/17 0508  WBC 41.8* 38.9*   < > 46.5* 36.1* 35.8* 34.6* 32.6*  NEUTROABS 29.7* 28.4*  --  39.1*  --   --   --   --   HGB 8.1* 7.9*   < > 7.7* 6.7* 7.1* 7.9* 8.4*  HCT 23.4* 22.5*   < > 22.2* 19.3* 20.4* 23.5* 25.0*  MCV 80.7 80.9   < > 78.7 80.1 80.6 82.2 84.5  PLT 93* 83*   < > 99* 78* 70* 69* 63*   < > = values in this interval not displayed.   Basic Metabolic Panel: Recent Labs  Lab 04/05/17 0733  04/06/17 0649 04/06/17 1325 04/07/17 0236 04/07/17 0540 04/08/17 0306 04/09/17 0508  NA 141   < > 141 137 140 140 139 139  K 3.6   < > 3.0* 3.1* 2.5* 3.2* 3.2* 3.4*  CL 112*   < > 103 103 99* 98* 96* 96*  CO2 16*   < > 21* _0 GLUCOSE 153*   < > 156* 160* 139* 146* 143* 122*  BUN 38*   < > 42* 42* 36* 38* 38* 35*  CREATININE 2.55*   < > 2.58* 2.49* 2.10* 2.36* 2.33* 2.15*  CALCIUM  7.6*   < > 6.7* 6.3* 5.4* 6.1* 5.5* 5.9*  MG  --   --  1.5* 1.6* 1.2* 1.4* 1.5* 1.6*  PHOS 1.0*  --  1.7*  --  2.2*  --  3.6 2.7   < > = values in this interval not displayed.   GFR: Estimated Creatinine Clearance: 37.9 mL/min (A) (by C-G formula based on SCr of 2.15 mg/dL (H)). Liver Function Tests: Recent Labs  Lab 04/04/17 0917  04/05/17 0733 04/06/17 0649 04/07/17 0236 04/08/17 0306 04/09/17 0508  AST 47*  --   --   --   --   --   --   ALT 23  --   --   --   --   --   --   ALKPHOS 101  --   --   --   --   --   --   BILITOT 0.8  --   --   --   --   --   --   PROT 4.8*  --   --   --   --   --   --   ALBUMIN 2.4*   < > 2.4* 2.3* 1.9* 2.2* 2.4*   < > = values in this interval not displayed.   Sepsis Labs: Recent Labs  Lab 04/04/17 2140  LATICACIDVEN 1.9    Recent Results (from the past 240 hour(s))  MRSA PCR Screening     Status: None   Collection Time: 03/29/2017  6:56 PM  Result Value Ref Range Status   MRSA by PCR NEGATIVE  NEGATIVE Final    Comment:        The GeneXpert MRSA Assay (FDA approved for NASAL specimens only), is one component of a comprehensive MRSA colonization surveillance program. It is not intended to diagnose MRSA infection nor to guide or monitor treatment for MRSA infections. Performed at [facility], [place] [address] [place], [place] [address]   Blood Culture (routine x 2)     Status: None   Collection Time: 03/15/2017  7:14 PM  Result Value Ref Range Status   Specimen Description   Final    BLOOD RIGHT HAND Performed at [place] [address], [place], [place] [address]    Special Requests   Final    AEB BCLV Performed at [place] [address], [place], [place] [address]    Culture   Final    NO GROWTH 5 DAYS Performed at [facility], [place] [address], [place], [place] [address]    Report Status 04/04/2017 FINAL  Final  Fungus Stain     Status: None   Collection Time: 04/03/17  1:47 PM  Result Value Ref Range Status   FUNGUS STAIN Final report  Final    Comment: (NOTE) Performed At: [facility] [place], [place] [id number] Rush Farmer MD [id number]    Fungal Source CSF  Final    Comment: Performed at [facility], [place] [address] [place], [place] [address]  Fungal Stain reflex     Status: None   Collection Time: 04/03/17  1:47 PM  Result Value Ref Range Status   Fungal stain result 1 Comment  Final    Comment: (NOTE) KOH/Calcofluor preparation:  no fungus observed. Performed At: [facility] [place], [place] [id number] Rush Farmer MD ZP:[id number] Performed at [facility], [place] [address], [place], [place] [address]  CSF culture     Status: None (Preliminary result)   Collection Time: 04/03/17  1:54 PM  Result Value Ref Range Status   Specimen Description CSF  Final   Special Requests NONE   Final   Gram Stain   Final    NO WBC SEEN NO ORGANISMS SEEN CYTOSPIN SMEAR Gram Stain Report Called to,Read Back By and Verified With: E.MATHAI AT 1618 ON 04/03/17 BY N.THOMPSON Performed at [facility], [place] [address] [place], [place] [address]    Culture PENDING  Incomplete   Report Status PENDING  Incomplete         Radiology Studies: Mr Virgel Paling TD Contrast  Result Date: 04/07/2017 CLINICAL DATA:  Lower extremity weakness. Right third nerve palsy. Sepsis. Progression of lymphoma. EXAM: MRA HEAD WITHOUT CONTRAST TECHNIQUE: Angiographic images of the Circle of Willis were obtained using MRA technique without intravenous contrast. COMPARISON:  04/05/2017 FINDINGS: Brain: Brain has normal appearance without evidence of malformation, atrophy, old or acute small or large vessel infarction, mass lesion, hemorrhage, hydrocephalus or extra-axial collection. Vascular: Major vessels at the base of the brain show flow. Venous sinuses appear patent. Skull and upper cervical spine: Normal. Sinuses/Orbits: Clear/normal. Other: None significant. IMPRESSION: Normal intracranial MR angiography of the large and medium size vessels. Electronically Signed   By: Nelson Chimes M.D.   On: 04/07/2017 17:07   Dg Chest Port 1 View  Result Date: 04/09/2017 CLINICAL DATA:  Shortness of Breath EXAM: PORTABLE CHEST 1 VIEW COMPARISON:  04/06/2017 FINDINGS: Right Port-A-Cath remains in place, unchanged. Continued right lower lobe opacity with blunting of the right costophrenic angle, stable. Left lung clear. Heart is upper limits normal in size. IMPRESSION: Continued right lower lobe airspace opacity and blunting of the right costophrenic angle, unchanged. Electronically Signed   By: Rolm Baptise M.D.   On: 04/09/2017 09:55        Scheduled Meds: . allopurinol  100 mg Oral BID  . aMILoride  5 mg Oral Daily  . amiodarone  400 mg Oral BID  . amoxicillin-clavulanate  500 mg Oral BID  . calcium  carbonate  500 mg of elemental calcium Oral TID WC  . Chlorhexidine Gluconate Cloth  6 each Topical Daily  . feeding supplement  1 Container Oral BID BM  . furosemide  80 mg Oral BID  . magnesium oxide  400 mg Oral Daily  . methylPREDNISolone (SOLU-MEDROL) injection  40 mg Intravenous Q12H  . metoprolol tartrate  25 mg Oral Q6H  . potassium chloride  40 mEq Oral TID  . senna  2 tablet Oral QHS  . sodium chloride flush  10-40 mL Intracatheter Q12H  . sodium chloride flush  3 mL Intravenous Q12H   Continuous Infusions: . diltiazem (CARDIZEM) infusion 10 mg/hr (04/09/17 1500)  . heparin 900 Units/hr (04/09/17 1500)     LOS: 10 days        Vernell Leep, MD, FACP, [facility]. [facility] Pager [phone]  If 7PM-7AM, please contact night-coverage [email or web address] Password [facility] 04/09/2017, 3:29 PM

## 2017-04-09 NOTE — Progress Notes (Signed)
Progress Note  Patient Name: Jo Collins Date of Encounter: 04/09/2017  Primary Cardiologist: No primary care provider on file.   Subjective   Sleepy, no complaints, no SOB, no CP. Family in room.   Inpatient Medications    Scheduled Meds: . allopurinol  100 mg Oral BID  . aMILoride  5 mg Oral Daily  . amiodarone  400 mg Oral BID  . amoxicillin-clavulanate  500 mg Oral BID  . calcium carbonate  500 mg of elemental calcium Oral TID WC  . Chlorhexidine Gluconate Cloth  6 each Topical Daily  . feeding supplement  1 Container Oral BID BM  . furosemide  80 mg Oral BID  . magnesium oxide  400 mg Oral Daily  . methylPREDNISolone (SOLU-MEDROL) injection  40 mg Intravenous Q12H  . metoprolol tartrate  25 mg Oral Q6H  . potassium chloride  40 mEq Oral TID  . senna  2 tablet Oral QHS  . sodium chloride flush  10-40 mL Intracatheter Q12H  . sodium chloride flush  3 mL Intravenous Q12H   Continuous Infusions: . diltiazem (CARDIZEM) infusion 10 mg/hr (04/09/17 0900)  . heparin 900 Units/hr (04/09/17 0900)   PRN Meds: acetaminophen, fentaNYL (SUBLIMAZE) injection, levalbuterol, ondansetron (ZOFRAN) IV, sodium chloride flush, sodium chloride flush, traZODone   Vital Signs    Vitals:   04/08/17 2300 04/09/17 0250 04/09/17 0500 04/09/17 0746  BP: 114/61 (!) 112/55  (!) 114/56  Pulse: 93 69  93  Resp: 11   14  Temp: 98.1 F (36.7 C)   97.9 F (36.6 C)  TempSrc: Oral   Oral  SpO2: 95%   96%  Weight:   (!) 356 lb (161.5 kg)   Height:        Intake/Output Summary (Last 24 hours) at 04/09/2017 0944 Last data filed at 04/09/2017 0900 Gross per 24 hour  Intake 1377.04 ml  Output 1851 ml  Net -473.96 ml   Filed Weights   03/10/2017 1855 04/03/17 0358 04/09/17 0500  Weight: 254 lb 10.1 oz (115.5 kg) 275 lb 2.2 oz (124.8 kg) (!) 356 lb (161.5 kg)    Telemetry    AFLUTTER 80's now - Personally Reviewed  ECG    AFLUTTER - Personally Reviewed  Physical Exam   GEN: No acute  distress.  Overweight Neck: No JVD Cardiac: irreg irreg, no murmurs, rubs, or gallops. Port noted right chest wall Respiratory: Clear to auscultation bilaterally. GI: Soft, nontender, non-distended  MS: + edema; No deformity. Neuro:  Nonfocal  Psych: Normal affect   Labs    Chemistry Recent Labs  Lab 04/04/17 0917  04/07/17 0236 04/07/17 0540 04/08/17 0306 04/09/17 0508  NA 140   < > 140 140 139 139  K 3.8   < > 2.5* 3.2* 3.2* 3.4*  CL 116*   < > 99* 98* 96* 96*  CO2 13*   < > _0 GLUCOSE 94   < > 139* 146* 143* 122*  BUN 30*   < > 36* 38* 38* 35*  CREATININE 2.41*   < > 2.10* 2.36* 2.33* 2.15*  CALCIUM 8.0*   < > 5.4* 6.1* 5.5* 5.9*  PROT 4.8*  --   --   --   --   --   ALBUMIN 2.4*   < > 1.9*  --  2.2* 2.4*  AST 47*  --   --   --   --   --   ALT 23  --   --   --   --   --  ALKPHOS 101  --   --   --   --   --   BILITOT 0.8  --   --   --   --   --   GFRNONAA 20*   < > 23* 20* 21* 23*  GFRAA 23*   < > 27* 24* 24* 26*  ANIONGAP 11   < > 14 15 16* 14   < > = values in this interval not displayed.     Hematology Recent Labs  Lab 04/07/17 0540 04/08/17 0306 04/09/17 0508  WBC 35.8* 34.6* 32.6*  RBC 2.53* 2.86* 2.96*  HGB 7.1* 7.9* 8.4*  HCT 20.4* 23.5* 25.0*  MCV 80.6 82.2 84.5  MCH 28.1 27.6 28.4  MCHC 34.8 33.6 33.6  RDW 20.7* 19.3* 19.7*  PLT 70* 69* 63*    Cardiac EnzymesNo results for input(s): TROPONINI in the last 168 hours. No results for input(s): TROPIPOC in the last 168 hours.   BNPNo results for input(s): BNP, PROBNP in the last 168 hours.   DDimer No results for input(s): DDIMER in the last 168 hours.   Radiology    Mr Jodene Nam Head Wo Contrast  Result Date: 04/07/2017 CLINICAL DATA:  Lower extremity weakness. Right third nerve palsy. Sepsis. Progression of lymphoma. EXAM: MRA HEAD WITHOUT CONTRAST TECHNIQUE: Angiographic images of the Circle of Willis were obtained using MRA technique without intravenous contrast. COMPARISON:  04/05/2017  FINDINGS: Brain: Brain has normal appearance without evidence of malformation, atrophy, old or acute small or large vessel infarction, mass lesion, hemorrhage, hydrocephalus or extra-axial collection. Vascular: Major vessels at the base of the brain show flow. Venous sinuses appear patent. Skull and upper cervical spine: Normal. Sinuses/Orbits: Clear/normal. Other: None significant. IMPRESSION: Normal intracranial MR angiography of the large and medium size vessels. Electronically Signed   By: Nelson Chimes M.D.   On: 04/07/2017 17:07    Cardiac Studies    - Technically difficult study. LVEF 75%, rapid atrial flutter is noted, moderate LVH, normal wall motion, MAC with at least mild stenosis and trivial MR, moderae LAE, mild TR, RVSP 32 mmHg, normal IVC, no pericardial effusion.    Patient Profile     67 y.o. female  diffuse large B-cell lymphoma, sciatica, GERD, morbid obesity who presented with acute renal failure, pancytopenia and concern for pneumonia and sepsis. Underwent cardioversion for atrial flutter with rapid ventricular response on April 1, with return to atrial fibrillation roughly 16 hours later, now back in atrial flutter with variable AV block.    Assessment & Plan    AFLUTTER  - now on PO amio (4/7)  - Dilt 10 IV  - improved rate control, variable AV block.   - consider cardioversion again later this week if needed  Lymphoma with suspected CNS involvement  - refractory. Poor prognosis.   Morbid obesity  - playing role in ATRIAL FLUTTER  Acute kidney injury  - Dr. Posey Pronto note reviewed.   - started amiloride to help with potassium and switched furosemide to 10m PO BID    For questions or updates, please contact CBuff[place] GapPlease consult [email or web address] for contact info under Cardiology/STEMI.      Signed, MCandee Furbish MD  04/09/2017, 9:44 AM

## 2017-04-09 NOTE — Progress Notes (Addendum)
Patient ID: Jo Collins, female   DOB: 06/15/1950, 67 y.o.   MRN: [id number]  [facility] Progress Note   Assessment/ Plan:   1. 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 (Unlikely TLS). More consistent with ischemic ATN in setting of volume depletion, tachycardia, relativehypotensive and acute illness. Urine output remains acceptable with ongoing diuretic use and renal function appears to be gradually improving.  Will start amiloride to help with potassium/magnesium control and switch to furosemide 80 mg twice a day by mouth. 2. Respiratory distress with acute hypoxemic respiratory failure-remains off NIPPV and with decreasing need for supplemental oxygen. 3. Right mid and lower lung consolidation consistent with HCAP. Remains on Solu-Medrol and now transitioned to oral Augmentin. 4. Hypokalemia-ongoing replacement with oral potassium supplement-begin amiloride. 5. Hypomagnesemia-remains low status post intravenous replacement, begin oral supplement and start amiloride. 6. Refractory DLBCL despite multiple chemo regimens. Prognosis remains poor, continue oncology follow-up. 7. Hypophosphatemia- likely due to poor po intake doubt Fanconi syndrome due to lack of glucosuria. Repleted and with K phos with improvement.  Continue to follow and replete as needed.  8. 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 Thrombocytopenia- due to malignancy 10. Severe protein and caloric malnutrition 11. A flutter with RVR failed DCCV and now onamio andcardizem drip with evidence of pulmonary edema and diastolic heart failure. Cardiology following and agree that improving potassium level might help with tachycardia. EP evaluated pt and no good options due her overall condition. 12. Diplopia with 3rd nerve palsy and left leg weakness-  neuro following. 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 13. Disposition- overall prognosis is poor. Appreciate palliative care consult to help set goal/limits of care as she is currently a full code and probability of survival is low. Consider transitioning to comfort care.  Would also ask her primary oncologist to discuss prognosis with family.  Will sign off at this time- please call with questions or concerns.    Subjective:   Without acute events overnight-reports that she is comfortable   Objective:   BP (!) 112/55 (BP Location: Right Leg)   Pulse 69   Temp 97.9 F (36.6 C) (Oral)   Resp 11   Ht 5\' 1"  (1.549 m)   Wt (!) 161.5 kg (356 lb)   SpO2 95%   BMI 67.27 kg/m   Intake/Output Summary (Last 24 hours) at 04/09/2017 0754 Last data filed at 04/09/2017 8469 Gross per 24 hour  Intake 1122.03 ml  Output 1851 ml  Net -728.97 ml   Weight change:   Physical Exam: Gen: Comfortably resting in bed, husband at bedside CVS: Pulse irregularly irregular, normal rate, S1 and S2 normal Resp: Decreased breath sounds of the bases with poor inspiratory effort, no rales Abd: Soft, obese, nontender Ext: Trace-1+ ankle edema bilaterally  Imaging: Mr Virgel Paling Wo Contrast  Result Date: 04/07/2017 CLINICAL DATA:  Lower extremity weakness. Right third nerve palsy. Sepsis. Progression of lymphoma. EXAM: MRA HEAD WITHOUT CONTRAST TECHNIQUE: Angiographic images of the Circle of Willis were obtained using MRA technique without intravenous contrast. COMPARISON:  04/05/2017 FINDINGS: Brain: Brain has normal appearance without evidence of malformation, atrophy, old or acute small or large vessel infarction, mass lesion, hemorrhage, hydrocephalus or extra-axial collection. Vascular: Major vessels at the base of the brain show flow. Venous sinuses appear patent. Skull and upper cervical spine: Normal. Sinuses/Orbits: Clear/normal. Other: None  significant. IMPRESSION: Normal intracranial MR angiography of  the large and medium size vessels. Electronically Signed   By: Nelson Chimes M.D.   On: 04/07/2017 17:07    Labs: BMET Recent Labs  Lab 04/04/17 1620  04/05/17 5784  04/05/17 2129 04/06/17 6962 04/06/17 1325 04/07/17 0236 04/07/17 0540 04/08/17 0306 04/09/17 0508  NA 138  138   < > 141   < > 140 141 137 140 140 139 139  K 4.0  4.0   < > 3.6   < > 3.3* 3.0* 3.1* 2.5* 3.2* 3.2* 3.4*  CL 114*  114*   < > 112*   < > 108 103 103 99* 98* 96* 96*  CO2 13*  13*   < > 16*   < > 19* 21* 22 27 27 27 29   GLUCOSE 136*  137*   < > 153*   < > 176* 156* 160* 139* 146* 143* 122*  BUN 32*  33*   < > 38*   < > 41* 42* 42* 36* 38* 38* 35*  CREATININE 2.57*  2.57*   < > 2.55*   < > 2.60* 2.58* 2.49* 2.10* 2.36* 2.33* 2.15*  CALCIUM 7.8*  7.8*   < > 7.6*   < > 7.1* 6.7* 6.3* 5.4* 6.1* 5.5* 5.9*  PHOS <1.0*  --  1.0*  --   --  1.7*  --  2.2*  --  3.6 2.7   < > = values in this interval not displayed.   CBC Recent Labs  Lab 04/04/17 0917 04/04/17 1620  04/06/17 0649 04/07/17 0236 04/07/17 0540 04/08/17 0306 04/09/17 0508  WBC 41.8* 38.9*   < > 46.5* 36.1* 35.8* 34.6* 32.6*  NEUTROABS 29.7* 28.4*  --  39.1*  --   --   --   --   HGB 8.1* 7.9*   < > 7.7* 6.7* 7.1* 7.9* 8.4*  HCT 23.4* 22.5*   < > 22.2* 19.3* 20.4* 23.5* 25.0*  MCV 80.7 80.9   < > 78.7 80.1 80.6 82.2 84.5  PLT 93* 83*   < > 99* 78* 70* 69* 63*   < > = values in this interval not displayed.    Medications:    . allopurinol  100 mg Oral BID  . amiodarone  400 mg Oral BID  . amoxicillin-clavulanate  500 mg Oral BID  . calcium carbonate  500 mg of elemental calcium Oral TID WC  . Chlorhexidine Gluconate Cloth  6 each Topical Daily  . feeding supplement  1 Container Oral BID BM  . furosemide  80 mg Intravenous Q12H  . methylPREDNISolone (SOLU-MEDROL) injection  40 mg Intravenous Q12H  . metoprolol tartrate  25 mg Oral Q6H  . potassium chloride  40 mEq Oral TID  . senna  2 tablet Oral QHS  . sodium chloride flush   10-40 mL Intracatheter Q12H  . sodium chloride flush  3 mL Intravenous Q12H   Elmarie Shiley, MD 04/09/2017, 7:54 AM

## 2017-04-09 NOTE — Progress Notes (Signed)
Patient was able to tolerate sitting in the chair for 2 hours today.

## 2017-04-09 NOTE — Progress Notes (Signed)
Daily Progress Note   Patient Name: Jo Collins       Date: 04/09/2017 DOB: 05/04/1950  Age: 67 y.o. MRN#: [id number] Attending Physician: Modena Jansky, MD Primary Care Physician: Deland Pretty, MD Admit Date: 03/15/2017  Reason for Consultation/Follow-up: Establishing goals of care  Subjective: Patient is having some difficulty with word finding and expressing herself.  Per her husband she is less confused than she was last week.   Invited Jo Collins out of the room to speak with him privately.  We discussed her "stroke like" syndrome of expressive aphasia and ptosis.  He is happy with the small but continuous improvements that Jo Collins is making.  She is stabilizing.    Jo Collins is aware that Oncology can not offer treatment unless Jo Collins improves significantly overall.  He is aware that chemotherapy can be very hard on her body.  He understands that the Lymphoma looms as the foundational problem.    At this point as long as she is continuing to improve the couple want full scope treatment.  Per Jo Collins "take it one day at a time".   We discussed repeat cardioversion later this week.  We discussed the need for rehab post hospitalization.  She has been to rehab 2x before and done well.   Assessment: Incremental improvements over the weekend.  +expressive aphasia, +ptosis, +left sided weakness.  Patient is slowly coming to her new baseline.   Patient Profile/HPI:  67 y.o.femalewith past medical history of recurrent B cell lymphoma followed by Dr. Irene Limbo, recurrent pleural effusion, obesity, chronic low back painwho was admitted on 3/29/2019with leg weakness, ptosis, sepsis. Initial work up revealed HCAP, afib with RVR, and acute kidney injury.Her respiratory status declined and she required  Bipap. Her pulse has been difficult to control. She is tachycardic in the 130s today despite amio and diltiazem drips. Her kidney function has declined over the past 6 weeks from a baseline of 0.9 on 2/20 to 2.5. Recent imaging of her chest abdomen and pelvis indicates progression of the lymphoma. There is concern that she may have brain metastases.   Length of Stay: 10  Current Medications: Scheduled Meds:  . allopurinol  100 mg Oral BID  . aMILoride  5 mg Oral Daily  . amiodarone  400 mg Oral BID  . amoxicillin-clavulanate  500 mg Oral BID  .  calcium carbonate  500 mg of elemental calcium Oral TID WC  . Chlorhexidine Gluconate Cloth  6 each Topical Daily  . feeding supplement  1 Container Oral BID BM  . furosemide  80 mg Oral BID  . magnesium oxide  400 mg Oral Daily  . methylPREDNISolone (SOLU-MEDROL) injection  40 mg Intravenous Q12H  . metoprolol tartrate  25 mg Oral Q6H  . potassium chloride  40 mEq Oral TID  . senna  2 tablet Oral QHS  . sodium chloride flush  10-40 mL Intracatheter Q12H  . sodium chloride flush  3 mL Intravenous Q12H    Continuous Infusions: . diltiazem (CARDIZEM) infusion 10 mg/hr (04/09/17 0900)  . heparin 900 Units/hr (04/09/17 0900)    PRN Meds: acetaminophen, fentaNYL (SUBLIMAZE) injection, levalbuterol, ondansetron (ZOFRAN) IV, sodium chloride flush, sodium chloride flush, traZODone  Physical Exam       Obese female with continued right sided ptosis.  Clear speech with word finding difficulty.  Appears fatigued. CV tachy Resp no distress Abdomen soft, nt, obese  Vital Signs: BP (!) 114/56 (BP Location: Right Arm)   Pulse 93   Temp 97.9 F (36.6 C) (Oral)   Resp 14   Ht 5\' 1"  (1.549 m)   Wt (!) 161.5 kg (356 lb)   SpO2 96%   BMI 67.27 kg/m  SpO2: SpO2: 96 % O2 Device: O2 Device: Room Air O2 Flow Rate: O2 Flow Rate (L/min): (S) 1 L/min  Intake/output summary:   Intake/Output Summary (Last 24 hours) at 04/09/2017 1117 Last data  filed at 04/09/2017 0900 Gross per 24 hour  Intake 1257.04 ml  Output 1151 ml  Net 106.04 ml   LBM: Last BM Date: 04/09/17 Baseline Weight: Weight: 115.5 kg (254 lb 10.1 oz) Most recent weight: Weight: (!) 161.5 kg (356 lb)       Palliative Assessment/Data:  40%      Patient Active Problem List   Diagnosis Date Noted  . Palliative care encounter   . DNI (do not intubate)   . Counseling regarding advance care planning and goals of care   . Hyperuricemia   . Acute respiratory failure with hypoxia ([place])   . AKI (acute kidney injury) ([place])   . 3rd nerve palsy, complete, right   . HCAP (healthcare-associated pneumonia)   . Atrial flutter ([place]) 03/11/2017  . Sepsis ([place]) 03/04/2017  . Leukocytosis 03/05/2017  . Acute on chronic renal failure ([place]) 03/24/2017  . 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 Foot pain, left   . Pancytopenia ([place]) 03/16/2017  . ARF (acute renal failure) ([place]) 03/16/2017  . Thrombocytopenia ([place]) 02/09/2017  . Left-sided low back pain with left-sided sciatica   . Abnormal CT of the abdomen   . Positive blood culture 02/07/2017  . Numbness 02/05/2017  . Atrial fibrillation with tachycardic ventricular rate ([place]) 01/08/2017  . LVH (left ventricular hypertrophy) 01/08/2017  . S/P thoracentesis   . Cough   . Neutrophilic leukocytosis 81/10/3157  . Pleural effusion 10/08/2016  . SOB (shortness of breath) 09/26/2016  . Acute respiratory distress 09/25/2016  . Chronic low back pain 09/25/2016  . Hypokalemia 09/25/2016  . Recurrent right pleural effusion 09/25/2016  . Diffuse large B cell lymphoma ([place]) 08/17/2016  . Acute midline thoracic back pain   . Lymphadenopathy   . Morbid obesity with BMI of 50.0-59.9, adult ([place])   . NHL (non-Hodgkin's lymphoma) ([place])   . Abdominal pain, RUQ   . Pleural effusion on right 08/07/2016  .  Abnormal EKG 08/07/2016  . 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 Anemia 08/07/2016  . GERD  (gastroesophageal reflux disease) 08/07/2016  . Shortness of breath 08/07/2016    Palliative Care Plan    Recommendations/Plan:   Husband will not change goals unless patient decompensates or Oncology indicates that there are no options to pursue.   Patient is DNI, otherwise full scope treatment.  PMT will follow from a distance - please call us back sooner if she decompensates     Goals of Care and Additional Recommendations:  Limitations on Scope of Treatment: No intubation, no feeding tube.  Code Status:  Limited code DNI  Prognosis:   Unable to determine.  Very likely weeks to months secondary to progressive decline, CHF, CKD, progressive lymphoma.  Discharge Planning:  [facility] for rehab with Palliative care service follow-up  Care plan was discussed with family.  Thank you for allowing the Palliative Medicine Team to assist in the care of this patient.  Total time spent:  35 min.     Greater than 50%  of this time was spent counseling and coordinating care related to the above assessment and plan.  Florentina Jenny, PA-C Palliative Medicine  Please contact Palliative MedicineTeam phone at [phone] for questions and concerns between 7 am - 7 pm.   Please see AMION for individual provider pager numbers.

## 2017-04-09 NOTE — Progress Notes (Signed)
[facility] for heparin Indication: atrial fibrillation  No Known Allergies  Patient Measurements: Height: 5\' 1"  (154.9 cm) Weight: (!) 356 lb (161.5 kg) IBW/kg (Calculated) : 47.8 Heparin Dosing Weight: 76 kg  Vital Signs: Temp: 98.1 F (36.7 C) (04/07 2300) Temp Source: Oral (04/07 2300) BP: 112/55 (04/08 0250) Pulse Rate: 69 (04/08 0250)  Labs: Recent Labs    04/07/17 0540  04/08/17 0306 04/08/17 1155 04/09/17 0508  HGB 7.1*  --  7.9*  --  8.4*  HCT 20.4*  --  23.5*  --  25.0*  PLT 70*  --  69*  --  63*  HEPARINUNFRC  --    < > 0.67 0.53 0.59  CREATININE 2.36*  --  2.33*  --  2.15*   < > = values in this interval not displayed.     Assessment: Patient is a 67 y.o. F with hx lymphoma currently undergoing chemotherapy treatment presented to the ED on 03/31/2017 with afib with RVR. On heparin for afib/aflutter. Cardioversion 4/1 then back to afib. Possible 2nd DCCV. Last heparin level remains therapeutic at 0.59. Hgb 8.4, plts low at 63  Goal of Therapy:  Heparin level 0.3-0.7 units/ml Monitor platelets by anticoagulation protocol: Yes   Plan Continue heparin gtt at 900 units/hr Monitor daily heparin level, plts closely, s/s of bleed  Elenor Quinones, PharmD, [facility] Clinical Pharmacist Pager [phone] 04/09/2017 7:41 AM

## 2017-04-10 DIAGNOSIS — L8990 Pressure ulcer of unspecified site, unspecified stage: Secondary | ICD-10-CM

## 2017-04-10 LAB — CBC
HEMATOCRIT: 27.7 % — AB (ref 36.0–46.0)
HEMOGLOBIN: 8.9 g/dL — AB (ref 12.0–15.0)
MCH: 27.5 pg (ref 26.0–34.0)
MCHC: 32.1 g/dL (ref 30.0–36.0)
MCV: 85.5 fL (ref 78.0–100.0)
Platelets: 64 10*3/uL — ABNORMAL LOW (ref 150–400)
RBC: 3.24 MIL/uL — ABNORMAL LOW (ref 3.87–5.11)
RDW: 20.1 % — AB (ref 11.5–15.5)
WBC: 36.2 10*3/uL — ABNORMAL HIGH (ref 4.0–10.5)

## 2017-04-10 LAB — RENAL FUNCTION PANEL
Albumin: 2.4 g/dL — ABNORMAL LOW (ref 3.5–5.0)
Anion gap: 13 (ref 5–15)
BUN: 31 mg/dL — ABNORMAL HIGH (ref 6–20)
CALCIUM: 6.4 mg/dL — AB (ref 8.9–10.3)
CHLORIDE: 97 mmol/L — AB (ref 101–111)
CO2: 29 mmol/L (ref 22–32)
Creatinine, Ser: 1.96 mg/dL — ABNORMAL HIGH (ref 0.44–1.00)
GFR calc non Af Amer: 25 mL/min — ABNORMAL LOW (ref >60)
GFR, EST AFRICAN AMERICAN: 29 mL/min — AB (ref >60)
GLUCOSE: 118 mg/dL — AB (ref 65–99)
Phosphorus: 1.8 mg/dL — ABNORMAL LOW (ref 2.5–4.6)
Potassium: 3.8 mmol/L (ref 3.5–5.1)
SODIUM: 139 mmol/L (ref 135–145)

## 2017-04-10 LAB — MAGNESIUM: Magnesium: 1.7 mg/dL (ref 1.7–2.4)

## 2017-04-10 LAB — HEPARIN LEVEL (UNFRACTIONATED): Heparin Unfractionated: 0.54 IU/mL (ref 0.30–0.70)

## 2017-04-10 MED ORDER — JUVEN PO PACK
1.0000 | PACK | Freq: Two times a day (BID) | ORAL | Status: DC
  Administered 2017-04-11: 1 via ORAL
  Filled 2017-04-10 (×5): qty 1

## 2017-04-10 MED ORDER — DILTIAZEM HCL 60 MG PO TABS
60.0000 mg | ORAL_TABLET | Freq: Four times a day (QID) | ORAL | Status: DC
Start: 2017-04-10 — End: 2017-04-11
  Administered 2017-04-10 – 2017-04-11 (×4): 60 mg via ORAL
  Filled 2017-04-10 (×4): qty 1

## 2017-04-10 MED ORDER — LORAZEPAM 2 MG/ML IJ SOLN
0.5000 mg | Freq: Once | INTRAMUSCULAR | Status: AC
  Administered 2017-04-10: 0.5 mg via INTRAVENOUS
  Filled 2017-04-10: qty 1

## 2017-04-10 MED ORDER — PREMIER PROTEIN SHAKE
11.0000 [oz_av] | Freq: Two times a day (BID) | ORAL | Status: DC
  Administered 2017-04-10: 11 [oz_av] via ORAL
  Filled 2017-04-10 (×5): qty 325.31

## 2017-04-10 MED ORDER — LORAZEPAM 0.5 MG PO TABS
0.5000 mg | ORAL_TABLET | Freq: Four times a day (QID) | ORAL | Status: DC | PRN
  Administered 2017-04-11: 0.5 mg via ORAL
  Filled 2017-04-10: qty 1

## 2017-04-10 NOTE — Progress Notes (Signed)
[facility] for heparin Indication: atrial fibrillation  No Known Allergies  Patient Measurements: Height: 5\' 1"  (154.9 cm) Weight: (!) 356 lb (161.5 kg) IBW/kg (Calculated) : 47.8 Heparin Dosing Weight: 76 kg  Vital Signs: Temp: 97.9 F (36.6 C) (04/09 0700) Temp Source: Oral (04/09 0700) BP: 115/59 (04/09 0700) Pulse Rate: 102 (04/09 0700)  Labs: Recent Labs    04/08/17 0306 04/08/17 1155 04/09/17 0508 04/10/17 0425  HGB 7.9*  --  8.4* 8.9*  HCT 23.5*  --  25.0* 27.7*  PLT 69*  --  63* 64*  HEPARINUNFRC 0.67 0.53 0.59 0.54  CREATININE 2.33*  --  2.15* 1.96*     Assessment: Patient is a 67 y.o. F with hx lymphoma currently undergoing chemotherapy treatment presented to the ED on 03/09/2017 with afib with RVR. On heparin for afib/aflutter. Cardioversion 4/1 then back to afib. Possible 2nd DCCV. Last heparin level remains therapeutic at 0.54. Hgb 8.9, plts low but stable at 64  Goal of Therapy:  Heparin level 0.3-0.7 units/ml Monitor platelets by anticoagulation protocol: Yes   Plan Continue heparin gtt at 900 units/hr Monitor daily heparin level, plts closely, s/s of bleed  Elenor Quinones, PharmD, [facility] Clinical Pharmacist Pager [phone] 04/10/2017 7:47 AM

## 2017-04-10 NOTE — Progress Notes (Signed)
Physical Therapy Treatment Patient Details Name: Jo Collins MRN: [id number] DOB: [birth year] Today's Date: 04/10/2017    History of Present Illness Pt is a 67 y.o. female with PMH of recurrent B cell lymphoma, pleural effusion, obesity, chronic LBP, admitted 03/21/2017 with LE weakness, R 3rd nerve palsy, sepsis; workup revealed HCAP, afib with RVR, and AKI. Recent imaging indicates progression of lymphoma; concern for brain metastases. Head MRI shows no acute intracranial process or definite intracranial lymphoma; further brain and spine imaging limited secondary to pt unable to tolerate MRI (cannot do under anesthesia due to respiratory status). Continues to have issues with A-fib with RVR. Other PMH includes non-Hodgkin's lymphoma, lymphadenopathy, h/o chemo.   PT Comments    Pt slowly progressing with mobility. Able to sit EOB >5 minutes with min guard for balance. Unable to stand fully upright with RW despite maxA+2. Pt with bowel incontinence and easily fatigued; returned to supine, requiring modA+2 to roll R/L for pericare. Pt limited by significant fatigue; falling asleep by end of session. Recommend maxisky lift for transfers with nursing staff (RN notified).  SpO2 >90% on RA Resting BP 110/80, post-mobility BP 106/50 HR up to 107   Follow Up Recommendations  SNF;Supervision/Assistance - 24 hour     Equipment Recommendations  (TBD next venue)    Recommendations for Other Services       Precautions / Restrictions Precautions Precautions: Fall Precaution Comments: Tachycardic, L foot drop Restrictions Weight Bearing Restrictions: No    Mobility  Bed Mobility Overal bed mobility: Needs Assistance Bed Mobility: Rolling;Sidelying to Sit;Sit to Supine Rolling: Mod assist;+2 for safety/equipment Sidelying to sit: Max assist   Sit to supine: Max assist   General bed mobility comments: Increased time and effort for bed mob, requiring cues for sequencing throughout. MaxA to  assist trunk elevation in sitting; +2 to assist scooting hips to EOB. Pt with bowel incontinence while sitting EOB. MaxA to assist BLEs into supine. ModA+2 to roll R/L for pericare  Transfers Overall transfer level: Needs assistance Equipment used: Rolling walker (2 wheeled) Transfers: Sit to/from Stand Sit to Stand: Max assist;+2 physical assistance         General transfer comment: Attempted to stand x2 with RW and maxA+2; unable to achieve fully upright posture. Pt easily fatigued by this; DOE 3/4 with SpO2 >90% on RA. Recommend maxisky lift for RN/NT transfers  Ambulation/Gait                 Stairs            Wheelchair Mobility    Modified Rankin (Stroke Patients Only)       Balance Overall balance assessment: Needs assistance Sitting-balance support: Bilateral upper extremity supported Sitting balance-Leahy Scale: Fair Sitting balance - Comments: Min guard for sitting EOB >5 minutes     Standing balance-Leahy Scale: Zero                              Cognition Arousal/Alertness: Lethargic;Suspect due to medications Behavior During Therapy: Flat affect Overall Cognitive Status: Impaired/Different from baseline Area of Impairment: Following commands;Problem solving;Attention;Safety/judgement;Awareness                   Current Attention Level: Selective   Following Commands: Follows one step commands with increased time Safety/Judgement: Decreased awareness of deficits Awareness: Emergent Problem Solving: Slow processing;Decreased initiation;Requires verbal cues General Comments: Max cues to wake pt this session. Pt seems to have slowed  processing; delayed responses or not responding to some questions.       Exercises      General Comments        Pertinent Vitals/Pain Pain Assessment: Faces Faces Pain Scale: Hurts a little bit Pain Location: Generalized Pain Descriptors / Indicators: Discomfort;Grimacing Pain  Intervention(s): Monitored during session    Home Living                      Prior Function            PT Goals (current goals can now be found in the care plan section) Acute Rehab PT Goals Patient Stated Goal: Get back home and to walking PT Goal Formulation: With patient Time For Goal Achievement: 04/20/17 Potential to Achieve Goals: Fair Progress towards PT goals: Progressing toward goals    Frequency    Min 2X/week      PT Plan Current plan remains appropriate    Co-evaluation              AM-PAC PT "6 Clicks" Daily Activity  Outcome Measure  Difficulty turning over in bed (including adjusting bedclothes, sheets and blankets)?: Unable Difficulty moving from lying on back to sitting on the side of the bed? : Unable Difficulty sitting down on and standing up from a chair with arms (e.g., wheelchair, bedside commode, etc,.)?: Unable Help needed moving to and from a bed to chair (including a wheelchair)?: Total Help needed walking in hospital room?: Total Help needed climbing 3-5 steps with a railing? : Total 6 Click Score: 6    End of Session Equipment Utilized During Treatment: Gait belt Activity Tolerance: Patient limited by fatigue Patient left: in bed;with call bell/phone within reach Nurse Communication: Mobility status;Need for lift equipment PT Visit Diagnosis: Other abnormalities of gait and mobility (R26.89);Muscle weakness (generalized) (M62.81)     Time: 4010-2725 PT Time Calculation (min) (ACUTE ONLY): 32 min  Charges:  $Therapeutic Activity: 23-37 mins                    G Codes:      Mabeline Caras, PT, DPT Acute Rehab Services  Pager: [place] 04/10/2017, 10:54 AM

## 2017-04-10 NOTE — Progress Notes (Signed)
RN called by NT stating pt c/o abd pain. Pt having frequent, type 6 stools. Macon Large, MD paged. RN will continue to monitor.

## 2017-04-10 NOTE — Progress Notes (Signed)
Progress Note  Patient Name: Jo Collins Date of Encounter: 04/10/2017  Primary Cardiologist: No primary care provider on file.   Subjective   No real changes since yesterday.  No chest pain  Inpatient Medications    Scheduled Meds: . allopurinol  100 mg Oral BID  . aMILoride  5 mg Oral Daily  . amiodarone  400 mg Oral BID  . amoxicillin-clavulanate  500 mg Oral BID  . calcium carbonate  500 mg of elemental calcium Oral TID WC  . feeding supplement  1 Container Oral BID BM  . furosemide  80 mg Oral BID  . magnesium oxide  400 mg Oral Daily  . metoprolol tartrate  25 mg Oral Q6H  . potassium chloride  40 mEq Oral TID  . predniSONE  40 mg Oral Q breakfast  . senna  2 tablet Oral QHS  . sodium chloride flush  10-40 mL Intracatheter Q12H  . sodium chloride flush  3 mL Intravenous Q12H   Continuous Infusions: . diltiazem (CARDIZEM) infusion 10 mg/hr (04/10/17 0253)  . heparin 900 Units/hr (04/10/17 0900)   PRN Meds: acetaminophen, fentaNYL (SUBLIMAZE) injection, levalbuterol, ondansetron (ZOFRAN) IV, sodium chloride flush, sodium chloride flush, traZODone   Vital Signs    Vitals:   04/09/17 2322 04/10/17 0305 04/10/17 0700 04/10/17 1033  BP: 120/62 134/64 (!) 115/59 (!) 106/50  Pulse: 71  (!) 102 (!) 104  Resp: (!) 21 20 13    Temp: 98.2 F (36.8 C) 98 F (36.7 C) 97.9 F (36.6 C)   TempSrc: Oral Oral Oral   SpO2:   95%   Weight:      Height:        Intake/Output Summary (Last 24 hours) at 04/10/2017 1101 Last data filed at 04/10/2017 1034 Gross per 24 hour  Intake 952 ml  Output 2800 ml  Net -1848 ml   Filed Weights   03/11/2017 1855 04/03/17 0358 04/09/17 0500  Weight: 254 lb 10.1 oz (115.5 kg) 275 lb 2.2 oz (124.8 kg) (!) 356 lb (161.5 kg)    Telemetry    AFIB/fluuter 100's - Personally Reviewed  ECG    AFIB - Personally Reviewed  Physical Exam   GEN: No acute distress. Neck: No JVD Cardiac: irreg, no murmurs, rubs, or gallops.  Respiratory:  Clear to auscultation bilaterally. GI: Soft, nontender, non-distended  MS: edema; No deformity. Neuro: Nonfocal Psych: Normal affect   Labs    Chemistry Recent Labs  Lab 04/04/17 0917  04/08/17 0306 04/09/17 0508 04/10/17 0425  NA 140   < > 139 139 139  K 3.8   < > 3.2* 3.4* 3.8  CL 116*   < > 96* 96* 97*  CO2 13*   < > 27 29 29   GLUCOSE 94   < > 143* 122* 118*  BUN 30*   < > 38* 35* 31*  CREATININE 2.41*   < > 2.33* 2.15* 1.96*  CALCIUM 8.0*   < > 5.5* 5.9* 6.4*  PROT 4.8*  --   --   --   --   ALBUMIN 2.4*   < > 2.2* 2.4* 2.4*  AST 47*  --   --   --   --   ALT 23  --   --   --   --   ALKPHOS 101  --   --   --   --   BILITOT 0.8  --   --   --   --   GFRNONAA 20*   < >  21* 23* 25*  GFRAA 23*   < > 24* 26* 29*  ANIONGAP 11   < > 16* 14 13   < > = values in this interval not displayed.     Hematology Recent Labs  Lab 04/08/17 0306 04/09/17 0508 04/10/17 0425  WBC 34.6* 32.6* 36.2*  RBC 2.86* 2.96* 3.24*  HGB 7.9* 8.4* 8.9*  HCT 23.5* 25.0* 27.7*  MCV 82.2 84.5 85.5  MCH 27.6 28.4 27.5  MCHC 33.6 33.6 32.1  RDW 19.3* 19.7* 20.1*  PLT 69* 63* 64*    Cardiac EnzymesNo results for input(s): TROPONINI in the last 168 hours. No results for input(s): TROPIPOC in the last 168 hours.   BNPNo results for input(s): BNP, PROBNP in the last 168 hours.   DDimer No results for input(s): DDIMER in the last 168 hours.   Radiology    Dg Chest Port 1 View  Result Date: 04/09/2017 CLINICAL DATA:  Shortness of Breath EXAM: PORTABLE CHEST 1 VIEW COMPARISON:  04/06/2017 FINDINGS: Right Port-A-Cath remains in place, unchanged. Continued right lower lobe opacity with blunting of the right costophrenic angle, stable. Left lung clear. Heart is upper limits normal in size. IMPRESSION: Continued right lower lobe airspace opacity and blunting of the right costophrenic angle, unchanged. Electronically Signed   By: Rolm Baptise M.D.   On: 04/09/2017 09:55    Cardiac Studies   Ejection  fraction 75%  Patient Profile     67 y.o. female with diffuse large cell B-cell lymphoma, atrial fibrillation/atrial flutter post cardioversion with acute kidney injury, pancytopenia, morbid obesity.  Assessment & Plan    Atrial fibrillation/flutter - P.o. amiodarone started on 04/08/2017.  400 mg twice a day. -Change to p.o. diltiazem 60 mg p.o. every 6 hours. -Currently on IV heparin for anticoagulation here in the hospital but is not on anticoagulation as an outpatient due to pancytopenia, risk greater than benefit.  For questions or updates, please contact [place] Please consult [email or web address] for contact info under Cardiology      Signed, Candee Furbish, MD  04/10/2017, 11:01 AM

## 2017-04-10 NOTE — Progress Notes (Signed)
Patient ID: Jo Collins, female   DOB: July 13, 1950, 67 y.o.   MRN: [id number]  PROGRESS NOTE    Renna Kilmer  LNL:[id number] DOB: 1950/03/08 DOA: 03/29/2017 PCP: Deland Pretty, MD   Brief Narrative:  67 year old female with history of paroxysmal atrial fibrillation not on anticoagulation, large B-cell lymphoma with ongoing chemotherapy and recent tumor lysis syndrome resulting in acute renal failure anemia and hospitalization, morbid obesity, chronic back pain, prior biopsy of the right iliac crest in January and ongoing issues with low back pain with radiation down to the left leg with weakness and numbness was admitted on 04/01/2017 with complaints of leg pain, diplopia.  She was admitted for A. fib with RVR and possible sepsis from pneumonia.  She was started on antibiotics.  Cardiology was consulted.  She had a TEE and cardioversion on 04/15/2017 but then converted back to A. fib.  Neurology was also consulted for neurological symptoms an MRI was ordered.  MRI could not be done at [facility] long hospital so she was transferred to [facility] on 04/03/2017.  She had LP done on 04/11/2017.  Oncology is also following.  Nephrology was consulted for worsening renal function and acidosis. PCCM was reconsulted for worsening hypoxia and have signed off.  Hypoxia resolved.  Ongoing issues with atrial flutter with RVR, remains on IV Cardizem drip, cardiology plans repeat cardioversion this week.  Pneumonia improving and transitioned to oral antibiotics.  Acute kidney injury improved and nephrology signed off 4/8.  Replacing electrolytes.  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  Palliative involved, DNI established.  Assessment & Plan:   Active Problems:   NHL (non-Hodgkin's lymphoma) (HCC)   Morbid obesity with BMI of 50.0-59.9, adult (HCC)   Diffuse large B cell lymphoma (HCC)   Recurrent right pleural effusion   Pleural effusion  Atrial fibrillation with tachycardic ventricular rate (HCC)   Atrial flutter (HCC)   Sepsis (HCC)   Leukocytosis   Acute on chronic renal failure (HCC)   HCAP (healthcare-associated pneumonia)   3rd nerve palsy, complete, right   Hyperuricemia   Acute respiratory failure with hypoxia (HCC)   AKI (acute kidney injury) ([place])   Palliative care encounter   DNI (do not intubate)   Counseling regarding advance care planning and goals of care   Pressure injury of skin   Acute hypoxic respiratory failure  - Probably from combination of pneumonia/volume overload/A. fib with RVR -Had been on BiPAP (off since 4/4).  Hypoxia resolved and has been on room air for several days now.  Palliative consulted and DNI established. -Changed IV Solu-Medrol to PO prednisone 40 mg daily 4/9 and taper down.  Atrial flutter with RVR - Underwent TEE and DCCV on 04/03/2017 but reverted to atrial flutter with RVR. -Continue IV heparin, IV Cardizem gtt.-->p.o. 4/9 amiodarone infusion changed to oral on 4/7.  Did receive couple doses of IV digoxin but not continued.  Aggressively replacing multiple electrolyte deficiencies. - Not on anticoagulation as an outpatient.  - IV Lasix changed to oral 80 mg twice daily and amiloride added by nephrology 4/8. - Cardiology follow-up appreciated & considering repeating cardioversion this week.  Sepsis from pneumonia - Cultures negative so far.  Complete course of antibiotics as below.  Sepsis resolved.  Right-sided pneumonia with pleural effusion - Vancomycin discontinued on 04/04/2017.   - Completed 9 days of cefepime.  No culture data to guide. Transitioned to oral Augmentin 4/8 and complete total 14  days course. -Repeat chest x-ray 4/8 shows persisting right lower lobe opacity.  Leukocytosis - Probably from pneumonia/A. fib/hypoxia and lymphoma.  Slowly improving.  Monitor.  Acute renal failure with metabolic acidosis -Nephrology was consulted.  Etiology felt to be  ischemic ATN-multifactorial.  Nonoliguric.  Briefly on IV fluids/bicarbonate drip, now off.  Briefly on IV Lasix. - UA was negative for infection and renal ultrasound was negative for hydronephrosis -Creatinine gradually improving.  Discussed with nephrology who have started on amiloride to help with hypokalemia/hypomagnesemia and have switched Lasix to oral. -Nephrology signed off 4/8.  Hypokalemia/hypomagnesemia/hypocalcemia -Replace and follow.   Right 3rd nerve palsy and left leg weakness in the setting of B-cell lymphoma - Status post LP done on 04/03/2017: No malignant cells.  Mixed lymphoid population.  CSF Gram stain and fungal stain negative.  CSF culture pending. -As per  Dr. Leonel Ramsay, Neurology, patient unable to tolerate performing MRI due to back pain and claustrophobia.  MRI that was done was a limited study and showed no acute findings.  Not candidate for performing under sedation due to concern for landing up on ventilator and unable to wean.  Per -Neurology follow-up and recommendations detailed in their note 4/6, please refer.  MRA head without acute findings.  No further workup recommended by Neurology.   -per Dr. Irene Limbo on 4/8.  He indicated that patient has received chemotherapy with CNS prophylaxis.  Her systemic disease has been refractory to systemic therapy.  She has overall poor prognosis.  Relapsed diffuse B-cell lymphoma -Oncology following. -Overall prognosis is guarded -Discussed with palliative care, now DNI and they will continue to follow.  Overall poor prognosis -I have reiterated that the patient will need to think carefully about options on discharge as her prognosis is guarded and it seems that the patient still wishes to have active treatment-we will reengage either hospice or palliative care to discuss with her what her options are i.e. skilled facility was palliative versus home with hospice  Anemia and thrombocytopenia -Probably from lymphoma and  chemotherapy -Transfuse to keep hemoglobin above 7.  Hemoglobin down to 7.1.  Transfused 1 unit PRBC 4/7.  Hemoglobin up to 8.4.  Platelet counts gradually drifting down/64 today.  No bleeding reported.  Next lab check 4/11   Morbid obesity -Outpatient follow-up  DVT prophylaxis: Heparin drip Code Status: DNI/partial Family Communication: Discussed with husband Disposition Plan: Depends on clinical outcome.  Not medically stable for discharge.  Consultants: Neurology/cardiology/PCCM/oncology/palliative care team.  Procedures: TEE/DCCV 04/14/2017 Mild biatrial dilation. Excellent left atrial appendage emptying velocities. No thrombus. LVEF>65%. Mild MR, mild TR. Normal aorta. No pericardial effusion.  Cardioverted1time(s).  Cardioversion with synchronized biphasic120Jshock.  Echo 04/01/17 Study Conclusions  - HPI and indications: Atrial flutter 427.32. - Procedure narrative: Transthoracic echocardiography. Image quality was poor. The study was technically difficult, as a result of body habitus. - Left ventricle: The cavity size was normal. Wall thickness was increased in a pattern of moderate LVH. Systolic function was vigorous. The estimated ejection fraction was 75%. The study is not technically sufficient to allow evaluation of LV diastolic function. - Mitral valve: Calcified annulus. Mildly thickened leaflets . At least mild stenosis. There was trivial regurgitation. Valve area by continuity equation (using LVOT flow): 1.68 cm^2. - Left atrium: Moderately dilated. - Atrial septum: There was increased thickness of the septum, consistent with lipomatous hypertrophy. - Tricuspid valve: There was mild regurgitation. - Pulmonary arteries: PA peak pressure: 32 mm Hg (S). - Inferior vena cava: The vessel was normal  in size. The respirophasic diameter changes were in the normal range (>= 50%), consistent with normal central venous  pressure.  Impressions:  - Technically difficult study. LVEF 75%, rapid atrial flutter is noted, moderate LVH, normal wall motion, MAC with at least mild stenosis and trivial MR, moderae LAE, mild TR, RVSP 32 mmHg, normal IVC, no pericardial effusion.   Antimicrobials:  Cefepime from 03/07/2017 onwards >4/6 Vancomycin from 03/26/2017-04/03/17 Oral Augmentin 4/7 >  Subjective: Doing fair but is overwhelmed at the news that her cancer is not treatable I cannot get an intelligible response out of her Her husband is at the bedside and seems to express understanding and interested in home hospice evaluation Nursing reports 6 stools however patient received senna overnight Objective: Vitals:   04/10/17 0305 04/10/17 0700 04/10/17 1033 04/10/17 1249  BP: 134/64 (!) 115/59 (!) 106/50 123/67  Pulse:  (!) 102 (!) 104   Resp: 20 13    Temp: 98 F (36.7 C) 97.9 F (36.6 C)    TempSrc: Oral Oral    SpO2:  95%    Weight:      Height:        Intake/Output Summary (Last 24 hours) at 04/10/2017 1457 Last data filed at 04/10/2017 1158 Gross per 24 hour  Intake 737 ml  Output 2600 ml  Net -1863 ml   Filed Weights   03/27/2017 1855 04/03/17 0358 04/09/17 0500  Weight: 115.5 kg (254 lb 10.1 oz) 124.8 kg (275 lb 2.2 oz) (!) 161.5 kg (356 lb)    Examination:  Flat affect obese periorbital edema no thrush no JVD no bruit S1-S2 tachycardic-heart rate 100-115 A. fib Chest is clear Abdomen is soft nontender No rebound Neuro intact moving all 4 extremities   Data Reviewed: I have personally reviewed following labs and imaging studies  CBC: Recent Labs  Lab 04/04/17 0917 04/04/17 1620  04/06/17 0649 04/07/17 0236 04/07/17 0540 04/08/17 0306 04/09/17 0508 04/10/17 0425  WBC 41.8* 38.9*   < > 46.5* 36.1* 35.8* 34.6* 32.6* 36.2*  NEUTROABS 29.7* 28.4*  --  39.1*  --   --   --   --   --   HGB 8.1* 7.9*   < > 7.7* 6.7* 7.1* 7.9* 8.4* 8.9*  HCT 23.4* 22.5*   < > 22.2* 19.3* 20.4*  23.5* 25.0* 27.7*  MCV 80.7 80.9   < > 78.7 80.1 80.6 82.2 84.5 85.5  PLT 93* 83*   < > 99* 78* 70* 69* 63* 64*   < > = values in this interval not displayed.   Basic Metabolic Panel: Recent Labs  Lab 04/06/17 0649  04/07/17 0236 04/07/17 0540 04/08/17 0306 04/09/17 0508 04/10/17 0425  NA 141   < > 140 140 139 139 139  K 3.0*   < > 2.5* 3.2* 3.2* 3.4* 3.8  CL 103   < > 99* 98* 96* 96* 97*  CO2 21*   < > 27 27 27 29 29   GLUCOSE 156*   < > 139* 146* 143* 122* 118*  BUN 42*   < > 36* 38* 38* 35* 31*  CREATININE 2.58*   < > 2.10* 2.36* 2.33* 2.15* 1.96*  CALCIUM 6.7*   < > 5.4* 6.1* 5.5* 5.9* 6.4*  MG 1.5*   < > 1.2* 1.4* 1.5* 1.6* 1.7  PHOS 1.7*  --  2.2*  --  3.6 2.7 1.8*   < > = values in this interval not displayed.   GFR: Estimated Creatinine Clearance: 41.6  mL/min (A) (by C-G formula based on SCr of 1.96 mg/dL (H)). Liver Function Tests: Recent Labs  Lab 04/04/17 0917  04/06/17 0649 04/07/17 0236 04/08/17 0306 04/09/17 0508 04/10/17 0425  AST 47*  --   --   --   --   --   --   ALT 23  --   --   --   --   --   --   ALKPHOS 101  --   --   --   --   --   --   BILITOT 0.8  --   --   --   --   --   --   PROT 4.8*  --   --   --   --   --   --   ALBUMIN 2.4*   < > 2.3* 1.9* 2.2* 2.4* 2.4*   < > = values in this interval not displayed.   Sepsis Labs: Recent Labs  Lab 04/04/17 2140  LATICACIDVEN 1.9    Recent Results (from the past 240 hour(s))  Fungus Stain     Status: None   Collection Time: 04/03/17  1:47 PM  Result Value Ref Range Status   FUNGUS STAIN Final report  Final    Comment: (NOTE) Performed At: [facility] [place], [place] [id number] Rush Farmer MD [id number]    Fungal Source CSF  Final    Comment: Performed at [facility], [place] [address], [place], [place] [address]  Fungal Stain reflex     Status: None   Collection Time: 04/03/17  1:47 PM  Result Value Ref Range Status   Fungal stain result 1  Comment  Final    Comment: (NOTE) KOH/Calcofluor preparation:  no fungus observed. Performed At: [facility] [place], [place] [id number] Rush Farmer MD [id number] Performed at [facility], [place] [address] [place], [place] [address]   CSF culture     Status: None (Preliminary result)   Collection Time: 04/03/17  1:54 PM  Result Value Ref Range Status   Specimen Description CSF  Final   Special Requests NONE  Final   Gram Stain   Final    NO WBC SEEN NO ORGANISMS SEEN CYTOSPIN SMEAR Gram Stain Report Called to,Read Back By and Verified With: E.MATHAI AT 1618 ON 04/03/17 BY N.THOMPSON Performed at [facility], [place] [address], [place], [place] [address]    Culture PENDING  Incomplete   Report Status PENDING  Incomplete         Radiology Studies: Dg Chest Port 1 View  Result Date: 04/09/2017 CLINICAL DATA:  Shortness of Breath EXAM: PORTABLE CHEST 1 VIEW COMPARISON:  04/06/2017 FINDINGS: Right Port-A-Cath remains in place, unchanged. Continued right lower lobe opacity with blunting of the right costophrenic angle, stable. Left lung clear. Heart is upper limits normal in size. IMPRESSION: Continued right lower lobe airspace opacity and blunting of the right costophrenic angle, unchanged. Electronically Signed   By: Rolm Baptise M.D.   On: 04/09/2017 09:55        Scheduled Meds: . allopurinol  100 mg Oral BID  . aMILoride  5 mg Oral Daily  . amiodarone  400 mg Oral BID  . amoxicillin-clavulanate  500 mg Oral BID  . calcium carbonate  500 mg of elemental calcium Oral TID WC  . diltiazem  60 mg Oral Q6H  . furosemide  80 mg Oral BID  . magnesium oxide  400 mg Oral Daily  . metoprolol tartrate  25 mg Oral Q6H  . nutrition supplement (JUVEN)  1 packet Oral BID BM  . potassium chloride  40 mEq Oral TID  . predniSONE  40 mg Oral Q breakfast  . protein supplement shake  11 oz Oral BID BM  . senna  2 tablet Oral  QHS  . sodium chloride flush  10-40 mL Intracatheter Q12H  . sodium chloride flush  3 mL Intravenous Q12H   Continuous Infusions: . heparin 900 Units/hr (04/10/17 0900)     LOS: 11 days        Verneita Griffes, MD [facility] (P) [phone]   If 7PM-7AM, please contact night-coverage [email or web address] Password [email or web address] 04/10/2017, 2:57 PM

## 2017-04-10 NOTE — Progress Notes (Signed)
Nutrition Follow-up  DOCUMENTATION CODES:   Morbid obesity  INTERVENTION:   -D/c Boost Breeze po TID, each supplement provides 250 kcal and 9 grams of protein -Premier Protein BID, each supplement provides 160 kcals and 60 grams protein -1 packet Juven BID, each packet provides 80 calories, 8 grams of carbohydrate, and 14 grams of amino acids; supplement contains CaHMB, glutamine, and arginine, to promote wound healing  NUTRITION DIAGNOSIS:   Increased nutrient needs related to catabolic illness, cancer and cancer related treatments, acute illness as evidenced by estimated needs.  Ongoing  GOAL:   Patient will meet greater than or equal to 90% of their needs  Unmet  MONITOR:   PO intake, Supplement acceptance, Weight trends, Labs  REASON FOR ASSESSMENT:   Consult Assessment of nutrition requirement/status  ASSESSMENT:   67 y.o. female with multiple medical problems including  paroxysmal atrial fibrillation not on anticoagulation, large B cell lymphoma, ongoing chemotherapy with recent tumor lysis syndrome resulting in acute renal failure, anemia and hospitalization, prior biopsy of the right iliac crest in January and ongoing issues with low back pain with radiation down to the left leg with weakness and numbness who was seen last week in the emergency department for complaints of leg pain, diplopia. She was admitted for afib with RVR and possible sepsis from pneumonia.   Reviewed I/O's: -1.7 L x 24 hours and +15.6 L since admission  Case discussed with RN prior to visit, who reports pt is slowly improving.   Spoke with pt, who was slow to respond to questions. She reports her appetite is "okay", however, noted meal completion 0-25%. Lunch tray was untouched; noted pt breakfast tray earlier today was also unattempted. Asked pt about supplements; Boost Breeze currently ordered, which pt is refusing. She reports tolerating liquids well. Discussed other supplements with pt  offered choice, to which pt replied "it doesn't matter to me".   Reviewed palliative care notes; pt does not want a feeding tube. Pt and family still want aggressive interventions.   Labs reviewed: Phos: 1.8.   NUTRITION - FOCUSED PHYSICAL EXAM:    Most Recent Value  Orbital Region  No depletion  Upper Arm Region  No depletion  Thoracic and Lumbar Region  No depletion  Buccal Region  No depletion  Temple Region  No depletion  Clavicle Bone Region  No depletion  Clavicle and Acromion Bone Region  No depletion  Scapular Bone Region  No depletion  Dorsal Hand  No depletion  Patellar Region  No depletion  Anterior Thigh Region  No depletion  Posterior Calf Region  No depletion  Edema (RD Assessment)  Moderate  Hair  Reviewed  Eyes  Reviewed  Mouth  Reviewed  Skin  Reviewed  Nails  Reviewed       Diet Order:  Diet Heart Room service appropriate? Yes; Fluid consistency: Thin  EDUCATION NEEDS:   No education needs have been identified at this time  Skin:  Skin Assessment: Skin Integrity Issues: Skin Integrity Issues:: Stage II, Other (Comment) Stage II: sacrum Other: lower back wound  Last BM:  04/10/17  Height:   Ht Readings from Last 1 Encounters:  03/23/2017 5\' 1"  (1.549 m)    Weight:   Wt Readings from Last 1 Encounters:  04/09/17 (!) 356 lb (161.5 kg)    Ideal Body Weight:  47.72 kg  BMI:  Body mass index is 67.27 kg/m.  Estimated Nutritional Needs:   Kcal:  1700-1900  Protein:  100-120 grams  Fluid:  1.7-1.9 L    Kalven Ganim A. Jimmye Norman, RD, LDN, CDE Pager: [phone] After hours Pager: [phone]

## 2017-04-11 DIAGNOSIS — R531 Weakness: Secondary | ICD-10-CM

## 2017-04-11 DIAGNOSIS — Z6841 Body Mass Index (BMI) 40.0 and over, adult: Secondary | ICD-10-CM

## 2017-04-11 DIAGNOSIS — H532 Diplopia: Secondary | ICD-10-CM

## 2017-04-11 LAB — RENAL FUNCTION PANEL
ALBUMIN: 2.5 g/dL — AB (ref 3.5–5.0)
ANION GAP: 16 — AB (ref 5–15)
BUN: 25 mg/dL — ABNORMAL HIGH (ref 6–20)
CALCIUM: 6.8 mg/dL — AB (ref 8.9–10.3)
CO2: 25 mmol/L (ref 22–32)
Chloride: 97 mmol/L — ABNORMAL LOW (ref 101–111)
Creatinine, Ser: 1.89 mg/dL — ABNORMAL HIGH (ref 0.44–1.00)
GFR calc non Af Amer: 27 mL/min — ABNORMAL LOW (ref >60)
GFR, EST AFRICAN AMERICAN: 31 mL/min — AB (ref >60)
Glucose, Bld: 74 mg/dL (ref 65–99)
PHOSPHORUS: 1.5 mg/dL — AB (ref 2.5–4.6)
Potassium: 4.6 mmol/L (ref 3.5–5.1)
SODIUM: 138 mmol/L (ref 135–145)

## 2017-04-11 LAB — CBC
HCT: 28.9 % — ABNORMAL LOW (ref 36.0–46.0)
HEMOGLOBIN: 9.4 g/dL — AB (ref 12.0–15.0)
MCH: 28.1 pg (ref 26.0–34.0)
MCHC: 32.5 g/dL (ref 30.0–36.0)
MCV: 86.5 fL (ref 78.0–100.0)
Platelets: 56 10*3/uL — ABNORMAL LOW (ref 150–400)
RBC: 3.34 MIL/uL — ABNORMAL LOW (ref 3.87–5.11)
RDW: 20.9 % — ABNORMAL HIGH (ref 11.5–15.5)
WBC: 35 10*3/uL — ABNORMAL HIGH (ref 4.0–10.5)

## 2017-04-11 LAB — C DIFFICILE QUICK SCREEN W PCR REFLEX
C DIFFICILE (CDIFF) INTERP: NOT DETECTED
C DIFFICILE (CDIFF) TOXIN: NEGATIVE
C DIFFICLE (CDIFF) ANTIGEN: NEGATIVE

## 2017-04-11 LAB — OCCULT BLOOD X 1 CARD TO LAB, STOOL: Fecal Occult Bld: POSITIVE — AB

## 2017-04-11 LAB — HEPARIN LEVEL (UNFRACTIONATED): Heparin Unfractionated: 0.45 IU/mL (ref 0.30–0.70)

## 2017-04-11 MED ORDER — DILTIAZEM HCL 60 MG PO TABS
90.0000 mg | ORAL_TABLET | Freq: Four times a day (QID) | ORAL | Status: DC
Start: 2017-04-11 — End: 2017-04-12
  Administered 2017-04-11 (×3): 90 mg via ORAL
  Filled 2017-04-11 (×4): qty 2

## 2017-04-11 MED ORDER — DIGOXIN 0.25 MG/ML IJ SOLN
0.1250 mg | Freq: Three times a day (TID) | INTRAMUSCULAR | Status: DC
  Administered 2017-04-11 – 2017-04-12 (×2): 0.125 mg via INTRAVENOUS
  Filled 2017-04-11 (×2): qty 2

## 2017-04-11 MED ORDER — FENTANYL CITRATE (PF) 100 MCG/2ML IJ SOLN: 12.5000 ug | INTRAMUSCULAR | Status: DC | PRN

## 2017-04-11 NOTE — Progress Notes (Signed)
Daily Progress Note   Patient Name: Jo Collins       Date: 04/11/2017 DOB: [birth year]  Age: 67 y.o. MRN#: [id number] Attending Physician: Nita Sells, MD Primary Care Physician: Deland Pretty, MD Admit Date: 03/20/2017  Reason for Consultation/Follow-up: Establishing goals of care  Subjective: Patient sound asleep.  Jo Collins makes the decisions in the family (the patient originally directed me to him).  If the patient is confused Jo Collins asks that we do not deliver bad news in front of her.    We discussed that Jo Collins is now stable - but she has a long way to go.  Per Jo Collins the Ptosis and extra occular eye movements are improved.  She is still some what confused.  She can sit on edge of bed with two person assist.  Jo Collins's perspective is that if Jo Collins can get well enough to go to SNF great.  Otherwise he wants to take her home with Hospice services.  We discussed Hospice services in the home as well as Jo Collins.  Jo Collins feels that [name] is a very strong willed woman and that she is likely to continue to have functional improvements.   Assessment: Patient now stabilizing but still very ill.  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  She is very weak and deconditioned.   Respiratory status has improved.  Renal function is slowly improving.  She will have to have significant improvement in order to tolerate (or be offered) any further chemotherapy.   Patient Profile/HPI:  67 y.o. female  with past medical history of recurrent B cell lymphoma followed by Dr. Irene Limbo, recurrent pleural effusion, obesity, chronic low back pain who was admitted on 03/06/2017 with leg weakness, ptosis, sepsis.  Initial work up revealed HCAP, afib with RVR, and  acute kidney injury. Her respiratory status declined and she required Bipap.  Her pulse has been difficult to control.  She is tachycardic in the 130s today despite amio and diltiazem drips.  Her kidney function has declined over the past 6 weeks from a baseline of 0.9 on 2/20 to 2.5.  Recent imaging of her chest abdomen and pelvis indicates progression of the lymphoma.  There is concern that she may have brain metastases.  CSF did not show malignant cells.  Limited imaging  that could be done did not show acute changes in her brain.      Length of Stay: 12  Current Medications: Scheduled Meds:  . allopurinol  100 mg Oral BID  . aMILoride  5 mg Oral Daily  . amiodarone  400 mg Oral BID  . amoxicillin-clavulanate  500 mg Oral BID  . calcium carbonate  500 mg of elemental calcium Oral TID WC  . diltiazem  90 mg Oral Q6H  . furosemide  80 mg Oral BID  . magnesium oxide  400 mg Oral Daily  . metoprolol tartrate  25 mg Oral Q6H  . nutrition supplement (JUVEN)  1 packet Oral BID BM  . potassium chloride  40 mEq Oral TID  . predniSONE  40 mg Oral Q breakfast  . sodium chloride flush  10-40 mL Intracatheter Q12H  . sodium chloride flush  3 mL Intravenous Q12H    Continuous Infusions: . heparin 900 Units/hr (04/10/17 0900)    PRN Meds: acetaminophen, fentaNYL (SUBLIMAZE) injection, levalbuterol, LORazepam, ondansetron (ZOFRAN) IV, sodium chloride flush, sodium chloride flush, traZODone  Physical Exam        Obese female, sleeping soundly.  Does not wake to voice or exam. CV tachy resp no distress Abdomen soft, nt, nd  Vital Signs: BP 108/73   Pulse (!) 129   Temp (!) 100.8 F (38.2 C) (Axillary)   Resp 20   Ht 5\' 1"  (1.549 m)   Wt (!) 161.5 kg (356 lb)   SpO2 96%   BMI 67.27 kg/m  SpO2: SpO2: 96 % O2 Device: O2 Device: Room Air O2 Flow Rate: O2 Flow Rate (L/min): (S) 1 L/min  Intake/output summary:   Intake/Output Summary (Last 24 hours) at 04/11/2017 1506 Last data filed  at 04/11/2017 1039 Gross per 24 hour  Intake 806 ml  Output 0 ml  Net 806 ml   LBM: Last BM Date: 04/10/17 Baseline Weight: Weight: 115.5 kg (254 lb 10.1 oz) Most recent weight: Weight: (!) 161.5 kg (356 lb)       Palliative Assessment/Data:  30%    Flowsheet Rows     Most Recent Value  Intake Tab  Referral Department  Critical care  Palliative Care Primary Diagnosis  Cancer  Date Notified  04/04/17  Palliative Care Type  New Palliative care  Reason for referral  Clarify Goals of Care  Date of Admission  03/31/2017  Date first seen by Palliative Care  04/05/17  # of days Palliative referral response time  1 Day(s)  # of days IP prior to Palliative referral  5  Clinical Assessment  Psychosocial & Spiritual Assessment  Palliative Care Outcomes      Patient Active Problem List   Diagnosis Date Noted  . Pressure injury of skin 04/10/2017  . Palliative care encounter   . DNI (do not intubate)   . Counseling regarding advance care planning and goals of care   . Hyperuricemia   . Acute respiratory failure with hypoxia ([place])   . AKI (acute kidney injury) ([place])   . 3rd nerve palsy, complete, right   . HCAP (healthcare-associated pneumonia)   . Atrial flutter ([place]) 03/07/2017  . Sepsis ([place]) 03/17/2017  . Leukocytosis 03/03/2017  . Acute on chronic renal failure ([place]) 03/10/2017  . 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 Foot pain, left   . Pancytopenia ([place]) 03/16/2017  . ARF (acute renal failure) ([place]) 03/16/2017  . Thrombocytopenia ([place]) 02/09/2017  . Left-sided low back pain  with left-sided sciatica   . Abnormal CT of the abdomen   . Positive blood culture 02/07/2017  . Numbness 02/05/2017  . Atrial fibrillation with tachycardic ventricular rate ([place]) 01/08/2017  . LVH (left ventricular hypertrophy) 01/08/2017  . S/P thoracentesis   . Cough   . Neutrophilic leukocytosis 03/88/8280  . Pleural effusion 10/08/2016  . SOB (shortness of breath)  09/26/2016  . Acute respiratory distress 09/25/2016  . Chronic low back pain 09/25/2016  . Hypokalemia 09/25/2016  . Recurrent right pleural effusion 09/25/2016  . 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 Acute midline thoracic back pain   . Lymphadenopathy   . Morbid obesity with BMI of 50.0-59.9, adult ([place])   . NHL (non-Hodgkin's lymphoma) ([place])   . Abdominal pain, RUQ   . Pleural effusion on right 08/07/2016  . Abnormal EKG 08/07/2016  . 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 Anemia 08/07/2016  . GERD (gastroesophageal reflux disease) 08/07/2016  . Shortness of breath 08/07/2016    Palliative Care Plan    Recommendations/Plan:  Continue current care.   Family desires SNF if Cletis's body will allow her to do it.   Otherwise, Home with Hospice.  Goals of Care and Additional Recommendations:  Limitations on Scope of Treatment: Full Scope Treatment  Code Status:  Limited code  DNI, no feeding tube.  Prognosis:   Unable to determine.  Very likely weeks to months secondary to progressive decline, CHF, CKD, progressive lymphoma.  She is at high risk for decompensation.  Discharge Planning:  [place] for rehab with Palliative care service follow-up  Care plan was discussed with Dr. Verlon Au.  Thank you for allowing the Palliative Medicine Team to assist in the care of this patient.  Total time spent:  25 min.     Greater than 50%  of this time was spent counseling and coordinating care related to the above assessment and plan.  Florentina Jenny, PA-C Palliative Medicine  Please contact Palliative MedicineTeam phone at [phone] for questions and concerns between 7 am - 7 pm.   Please see AMION for individual provider pager numbers.

## 2017-04-11 NOTE — Progress Notes (Signed)
HEMATOLOGY/ONCOLOGY INPATIENT PROGRESS NOTE  Date of Service: 04/11/2017  Inpatient Attending: .Nita Sells, MD   SUBJECTIVE:  [name] is accompanied by her husband at bedside. Her husband reports that the pt is having some difficulties vocalizing all of her thoughts. He also notes that her leg and back pain continue to be significant and she is lethargic and sleepy most of the day. 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 He is consoled by the fact that everything medically and oncologically possible was done. CNS relapse appears to have occurred despite prophylactic Intrathecal MTX. I discussed with the pt's husband that I would recommend a focus on keeping her comfortable and spending her remaining time with her family. He care needs suggest she would best be served by considering institutional hospice at [facility] place. He is going to take some time to talk to the rest of their family to make this decision but feels he is now more ready to consider this decision.  OBJECTIVE:  Lethargic, makes eye contact but not much verbalization.  PHYSICAL EXAMINATION: . Vitals:   04/11/17 0400 04/11/17 0822 04/11/17 1301 04/11/17 1322  BP: 121/63 99/72 (!) 87/53 108/73  Pulse: (!) 117 (!) 115 (!) 129   Resp: 18 (!) 25 20   Temp: 98.5 F (36.9 C) 100 F (37.8 C) (!) 100.8 F (38.2 C)   TempSrc: Oral Axillary Axillary   SpO2:  94% 96%   Weight:      Height:       Filed Weights   03/29/2017 1855 04/03/17 0358 04/09/17 0500  Weight: 254 lb 10.1 oz (115.5 kg) 275 lb 2.2 oz (124.8 kg) (!) 356 lb (161.5 kg)   .Body mass index is 67.27 kg/m.  GENERAL:lethargic but with no overt distress. SKIN: skin color, texture, turgor are normal, no rashes or significant  lesions EYES: normal, conjunctiva are pink and non-injected, sclera clear OROPHARYNX:no exudate, no erythema and lips, buccal mucosa, and tongue normal  NECK: supple, no JVD, thyroid normal size, non-tender, without nodularity LYMPH:  no palpable lymphadenopathy in the cervical, axillary or inguinal LUNGS: clear to auscultation with normal respiratory effort HEART: regular rate & rhythm,  no murmurs and no lower extremity edema ABDOMEN: abdomen obese  non-tender, normoactive bowel sounds  Musculoskeletal: no cyanosis of digits and no clubbing  NEURO: lethargic, 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 Abdominal pain, RUQ   . Abnormal EKG 08/07/2016  . Acute midline thoracic back pain   . Acute respiratory distress 09/25/2016  . Anemia 08/07/2016  . Chronic low back pain 09/25/2016  . Cough   . DLBCL (diffuse large B cell lymphoma) ([place]) 08/17/2016  . GERD (gastroesophageal reflux disease)   . History of chemotherapy   . Hypokalemia 09/25/2016  . 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 Lymphadenopathy   . Morbid obesity with BMI of 50.0-59.9, adult ([place])   . Neutrophilic leukocytosis 02/08/2534  . NHL (non-Hodgkin's lymphoma) ([place])   . Pleural effusion on right 08/07/2016  . Recurrent pleural effusion on right 10/08/2016  . Recurrent right pleural effusion 09/25/2016  . S/P thoracentesis   . Shortness of breath 08/07/2016  . SOB (shortness of breath) 09/26/2016    SURGICAL HISTORY: Past Surgical History:  Procedure Laterality Date  . ABDOMINAL HYSTERECTOMY    . CARDIOVERSION N/A 05/01/2017   Procedure: CARDIOVERSION;  Surgeon: Sanda Klein, MD;  Location: [facility]S;  Service: Cardiovascular;  Laterality: N/A;  . IR CV LINE INJECTION  10/26/2016  . IR FLUORO GUIDE PORT INSERTION RIGHT  08/18/2016  . IR FLUORO GUIDE PORT INSERTION RIGHT  11/03/2016  . IR REMOVAL TUN CV CATH W/O FL  11/03/2016  . IR THORACENTESIS ASP PLEURAL SPACE W/IMG GUIDE   08/07/2016  . IR THORACENTESIS ASP PLEURAL SPACE W/IMG GUIDE  08/14/2016  . IR US GUIDE VASC ACCESS RIGHT  08/18/2016  . IR US GUIDE VASC ACCESS RIGHT  11/03/2016    SOCIAL HISTORY: Social History   Socioeconomic History  . Marital status: Married    Spouse name: Not on file  . Number of children: Not on file  . Years of education: Not on file  . Highest education level: Not on file  Occupational History  . Not on file  Social Needs  . Financial resource strain: Not hard at all  . Food insecurity:    Worry: Never true    Inability: Never true  . Transportation needs:    Medical: No    Non-medical: No  Tobacco Use  . Smoking status: Former Smoker    Packs/day: 0.50    Years: 47.00    Pack years: 23.50    Types: Cigarettes    Last attempt to quit: 07/28/2016    Years since quitting: 0.7  . Smokeless tobacco: Never Used  Substance and Sexual Activity  . Alcohol use: No  . Drug use: No  . Sexual activity: Not on file  Lifestyle  . Physical activity:    Days per week: Patient refused    Minutes per session: Patient refused  . Stress: To some extent  Relationships  . Social connections:    Talks on phone: Not on file    Gets together: Not on file    Attends religious service: Not on file    Active member of club or organization: Not on file    Attends meetings of clubs or organizations: Not on file    Relationship status: Not on file  . Intimate partner violence:    Fear of current or ex partner: Not on file    Emotionally abused: Not on file    Physically abused: Not on file    Forced sexual activity: Not on file  Other Topics Concern  . Not on file  Social History Narrative  . Not on file    FAMILY HISTORY: Family History  Problem Relation Age of Onset  . COPD Mother   . Diabetes Mellitus II Sister   . Diabetes Mellitus II Brother   . Diabetes Mellitus II Sister   . Stroke Sister   . Hypertension Sister   . Diabetes Mellitus II Sister     ALLERGIES:   has No Known Allergies.  MEDICATIONS:  Scheduled Meds: . allopurinol  100 mg Oral BID  . aMILoride  5 mg Oral Daily  . amiodarone  400 mg Oral BID  . amoxicillin-clavulanate  500 mg Oral BID  . calcium carbonate  500 mg of elemental calcium Oral TID WC  . diltiazem  90 mg Oral Q6H  . furosemide  80 mg Oral BID  . magnesium oxide  400 mg Oral Daily  . metoprolol tartrate  25 mg Oral Q6H  . nutrition supplement (JUVEN)  1 packet Oral BID BM  .  potassium chloride  40 mEq Oral TID  . predniSONE  40 mg Oral Q breakfast  . sodium chloride flush  10-40 mL Intracatheter Q12H  . sodium chloride flush  3 mL Intravenous Q12H   Continuous Infusions: . heparin 900 Units/hr (04/10/17 0900)   PRN Meds:.acetaminophen, fentaNYL (SUBLIMAZE) injection, levalbuterol, LORazepam, ondansetron (ZOFRAN) IV, sodium chloride flush, sodium chloride flush, traZODone  REVIEW OF SYSTEMS:    10 Point review of Systems was done is negative except as noted above.   LABORATORY DATA:  I have reviewed the data as listed  . CBC Latest Ref Rng & Units 04/11/2017 04/10/2017 04/09/2017  WBC 4.0 - 10.5 K/uL 35.0(H) 36.2(H) 32.6(H)  Hemoglobin 12.0 - 15.0 g/dL 9.4(L) 8.9(L) 8.4(L)  Hematocrit 36.0 - 46.0 % 28.9(L) 27.7(L) 25.0(L)  Platelets 150 - 400 K/uL 56(L) 64(L) 63(L)    . CMP Latest Ref Rng & Units 04/11/2017 04/10/2017 04/09/2017  Glucose 65 - 99 mg/dL 74 118(H) 122(H)  BUN 6 - 20 mg/dL 25(H) 31(H) 35(H)  Creatinine 0.44 - 1.00 mg/dL 1.89(H) 1.96(H) 2.15(H)  Sodium 135 - 145 mmol/L 138 139 139  Potassium 3.5 - 5.1 mmol/L 4.6 3.8 3.4(L)  Chloride 101 - 111 mmol/L 97(L) 97(L) 96(L)  CO2 22 - 32 mmol/L 25 29 29   Calcium 8.9 - 10.3 mg/dL 6.8(L) 6.4(LL) 5.9(LL)  Total Protein 6.5 - 8.1 g/dL - - -  Total Bilirubin 0.3 - 1.2 mg/dL - - -  Alkaline Phos 38 - 126 U/L - - -  AST 15 - 41 U/L - - -  ALT 14 - 54 U/L - - -     RADIOGRAPHIC STUDIES: I have personally reviewed the radiological images as listed and  agreed with the findings in the report. Ct Head Wo Contrast  Result Date: 03/15/2017 CLINICAL DATA:  Double vision. EXAM: CT HEAD WITHOUT CONTRAST TECHNIQUE: Contiguous axial images were obtained from the base of the skull through the vertex without intravenous contrast. COMPARISON:  MRI of February 06, 2017. FINDINGS: Brain: Mild chronic ischemic white matter disease is noted. No mass effect or midline shift is noted. Ventricular size is within normal limits. There is no evidence of mass lesion, hemorrhage or acute infarction. Vascular: No hyperdense vessel or unexpected calcification. Skull: Normal. Negative for fracture or focal lesion. Sinuses/Orbits: No acute finding. Other: None. IMPRESSION: Mild chronic ischemic white matter disease. No acute intracranial abnormality seen. Electronically Signed   By: Marijo Conception, M.D.   On: 03/29/2017 15:01   Mr Jodene Nam Head Wo Contrast  Result Date: 04/07/2017 CLINICAL DATA:  Lower extremity weakness. Right third nerve palsy. Sepsis. Progression of lymphoma. EXAM: MRA HEAD WITHOUT CONTRAST TECHNIQUE: Angiographic images of the Circle of Willis were obtained using MRA technique without intravenous contrast. COMPARISON:  04/05/2017 FINDINGS: Brain: Brain has normal appearance without evidence of malformation, atrophy, old or acute small or large vessel infarction, mass lesion, hemorrhage, hydrocephalus or extra-axial collection. Vascular: Major vessels at the base of the brain show flow. Venous sinuses appear patent. Skull and upper cervical spine: Normal. Sinuses/Orbits: Clear/normal. Other: None significant. IMPRESSION: Normal intracranial MR angiography of the large and medium size vessels. Electronically Signed   By: Nelson Chimes M.D.   On: 04/07/2017 17:07   Mr Brain Wo Contrast  Result Date: 04/05/2017 CLINICAL DATA:  Recurrent B-cell lymphoma, assess for brain metastasis. EXAM: MRI HEAD WITHOUT CONTRAST TECHNIQUE: Axial diffusion weighted imaging, axial T2  FLAIR, axial T2 and axial T2 SWAN on a 3 tesla scanner. Patient  was unable to tolerate further imaging. COMPARISON:  CT HEAD March 30, 2017 and MRI of the head February 07, 2016 FINDINGS: Sequences vary from moderate to severely motion degraded. INTRACRANIAL CONTENTS: No reduced diffusion to suggest acute ischemia or hypercellular tumor. No susceptibility artifact to suggest lobar hemorrhage though motion limits sensitivity for micro hemorrhages. The ventricles and sulci are normal for patient's age. Stable patchy supratentorial white matter FLAIR T2 hyperintensities. No suspicious parenchymal signal, definite masses, mass effect. No abnormal extra-axial fluid collections. No extra-axial masses. VASCULAR: Normal major intracranial vascular flow voids present at skull base. SKULL AND UPPER CERVICAL SPINE: Empty sella better demonstrated on prior MRI. No suspicious calvarial bone marrow signal. Craniocervical junction maintained. SINUSES/ORBITS: The mastoid air-cells and included paranasal sinuses are well-aerated.The included ocular globes and orbital contents are non-suspicious. OTHER: None. IMPRESSION: 1. Limited noncontrast motion degraded 4 sequence MRI of the head. 2. No acute intracranial process or definite intracranial lymphoma though contrast enhanced sequences are more sensitive. 3. Stable mild-to-moderate chronic small vessel ischemic disease. 4. Stable empty sella. Electronically Signed   By: Elon Alas M.D.   On: 04/05/2017 16:16   [place] Chest (pleural Effusion)  Result Date: 04/04/2017 CLINICAL DATA:  Follow-up right pleural effusion EXAM: CHEST ULTRASOUND COMPARISON:  None. FINDINGS: Small right pleural effusion is noted. No sizable component to allow for safe thoracentesis is noted. IMPRESSION: Small right-sided pleural effusion. Electronically Signed   By: Inez Catalina M.D.   On: 04/04/2017 15:10   US Renal  Result Date: 04/01/2017 CLINICAL DATA:  Acute renal failure. EXAM: RENAL / URINARY  TRACT ULTRASOUND COMPLETE COMPARISON:  None. FINDINGS: Right Kidney: Length: 10.6 cm. Echogenicity within normal limits. No mass or hydronephrosis visualized. Left Kidney: Length: 11.3 cm. Echogenicity within normal limits. No mass or hydronephrosis visualized. Bladder: Appears normal for degree of bladder distention. IMPRESSION: Normal renal ultrasound. Electronically Signed   By: Fidela Salisbury M.D.   On: 04/01/2017 01:21   Dg Chest Port 1 View  Result Date: 04/09/2017 CLINICAL DATA:  Shortness of Breath EXAM: PORTABLE CHEST 1 VIEW COMPARISON:  04/06/2017 FINDINGS: Right Port-A-Cath remains in place, unchanged. Continued right lower lobe opacity with blunting of the right costophrenic angle, stable. Left lung clear. Heart is upper limits normal in size. IMPRESSION: Continued right lower lobe airspace opacity and blunting of the right costophrenic angle, unchanged. Electronically Signed   By: Rolm Baptise M.D.   On: 04/09/2017 09:55   Dg Chest Port 1 View  Result Date: 04/06/2017 CLINICAL DATA:  Respiratory failure.  Shortness of breath. EXAM: PORTABLE CHEST 1 VIEW COMPARISON:  04/05/2017; 04/04/2017; 10/10/2026 chest CT-02/06/2017 FINDINGS: Grossly unchanged cardiac silhouette and mediastinal contours. Stable positioning of support apparatus. Improved aeration of lung bases with persistent right heterogeneous/consolidative opacities and suspected trace right-sided effusion. No new focal airspace opacities. No pneumothorax. No evidence of edema. No acute osseus abnormalities. IMPRESSION: Improved aeration the lung bases with persistent right basilar opacities and suspected trace right-sided effusion, similar to the 10/2016 examination and thus favored to represent atelectasis or scar Electronically Signed   By: Sandi Mariscal M.D.   On: 04/06/2017 09:30   Dg Chest Port 1 View  Result Date: 04/05/2017 CLINICAL DATA:  Respiratory failure short of breath EXAM: PORTABLE CHEST 1 VIEW COMPARISON:  04/04/2017  FINDINGS: Right jugular central venous catheter tip in the SVC unchanged. Extensive right lower lobe infiltrate unchanged with small right effusion. Mild left lower lobe atelectasis/infiltrate unchanged. IMPRESSION: Right lower lobe infiltrate unchanged, possible pneumonia. Small  right effusion unchanged Left lower lobe atelectasis/infiltrate unchanged. Electronically Signed   By: Franchot Gallo M.D.   On: 04/05/2017 07:50   Dg Chest Port 1 View  Result Date: 04/04/2017 CLINICAL DATA:  Shortness of breath.  History of large cell lymphoma EXAM: PORTABLE CHEST 1 VIEW COMPARISON:  March 30, 2017 FINDINGS: Port-A-Cath tip is in the superior vena cava. No pneumothorax. There is a right pleural effusion with patchy airspace opacity throughout the right mid lower lung zones. The left lung is clear. Heart is upper normal in size with pulmonary vascularity within normal limits. No adenopathy evident. No bone lesions. IMPRESSION: Persistent pleural effusion on the right with areas of consolidation in portions of the right mid lower lung zones. Left lung remains clear. Stable cardiac silhouette. No new opacity evident. Electronically Signed   By: Lowella Grip III M.D.   On: 04/04/2017 09:20   Dg Chest Port 1 View  Result Date: 03/16/2017 CLINICAL DATA:  Tachycardia EXAM: PORTABLE CHEST 1 VIEW COMPARISON:  March 17, 2017 FINDINGS: Port-A-Cath tip is in the superior vena cava. No pneumothorax. There is airspace consolidation in the right lower lobe with small right pleural effusion. Left lung is clear. Heart is borderline enlarged with pulmonary vascularity within normal limits. No adenopathy. No bone lesions. IMPRESSION: Airspace consolidation consistent with pneumonia right lower lung zone with small right pleural effusion. Left lung clear. Stable cardiac silhouette. Port-A-Cath tip in superior vena cava. No pneumothorax. Electronically Signed   By: Lowella Grip III M.D.   On: 03/04/2017 14:06   Dg Chest  Port 1 View  Result Date: 03/17/2017 CLINICAL DATA:  SOB today EXAM: PORTABLE CHEST 1 VIEW COMPARISON:  CT 02/06/2017 FINDINGS: Port in the anterior chest wall with tip in distal SVC. Normal cardiac silhouette. There is RIGHT pleural effusion and atelectasis. Upper lungs clear. No pneumothorax. IMPRESSION: 1. Persistent RIGHT pleural effusion and RIGHT basilar scarring. Chronic findings. 2. LEFT lung clear Electronically Signed   By: Suzy Bouchard M.D.   On: 03/17/2017 07:37   Dg Foot Complete Left  Result Date: 03/17/2017 CLINICAL DATA:  67 year old female with left foot pain for 1 month. Plantar and lateral aspect pain with no known injury. EXAM: LEFT FOOT - COMPLETE 3+ VIEW COMPARISON:  None. FINDINGS: Degenerative spurring of the plantar and dorsal calcaneus. Dorsal degenerative spurring throughout the tarsal bones, including dystrophic calcification or spurring along the medial anterior talus. Accessory ossicle adjacent to the cuboid. The metatarsals are intact. There is mild joint space loss and degenerative subchondral sclerosis at the 1st MTP and 1st IP joints. There is chronic resorption of the head of the 5th proximal phalanx. The 5th middle and distal phalanges appear normal. No acute osseous abnormality identified. Generalized soft tissue swelling. No subcutaneous gas. IMPRESSION: Chronic and degenerative osseous changes throughout the left foot. No acute osseous abnormality identified. Generalized soft tissue swelling. Electronically Signed   By: Genevie Ann M.D.   On: 03/17/2017 15:40   Dg Fluoro Guide Lumbar Puncture  Result Date: 04/03/2017 CLINICAL DATA:  67 year old female with history of lymphoma and intrathecal chemotherapy presents with double vision, lower extremity weakness, atrial fibrillation with RVR. EXAM: DIAGNOSTIC LUMBAR PUNCTURE UNDER FLUOROSCOPIC GUIDANCE COMPARISON:  Lumbar MRI 02/08/2017. CT Abdomen and Pelvis 02/06/2017. FLUOROSCOPY TIME:  Fluoroscopy Time:  0 minutes 18  seconds Radiation Exposure Index (if provided by the fluoroscopic device): 2.8 mGy Number of Acquired Spot Images: 0 PROCEDURE: Informed consent was obtained from the patient prior to the procedure, including  potential complications of headache, allergy, and pain. A "time-out" was performed. With the patient prone, the lower back was prepped with Betadine. 1% Lidocaine was used for local anesthesia. Lumbar puncture was performed at the L2-L3 level using a 6 in x 20 gauge needle with return of slightly yellow tinged CSF. The CSF color appeared constant throughout the 4 tubes. Due to immobility, CSF opening pressure with the patient decubitus was not performed, but subjectively the CSF pressure was normal, with slow CSF flow through the 6 in spinal needle. 17 mL of CSF were obtained for laboratory studies. The patient tolerated the procedure well and there were no apparent complications. Appropriate post procedural orders were placed on the chart. The patient was returned to the inpatient floor in stable condition for continued treatment. IMPRESSION: Fluoroscopic guided lumbar puncture at L2-L3. 17 mL of yellow-tinged CSF were obtained for laboratory studies. Electronically Signed   By: Genevie Ann M.D.   On: 04/03/2017 14:05    ASSESSMENT & PLAN:   66 y.o.with GERD, morbid obesity, likely sleep apnea, smoker recently quit with  #1 Relapsed Refractory DLBCL - now with chest wall mass and increasing pelvic lymphadenopathy and progressively increasing LDH levels and new anemia and thrombocytopenia with  BM Mx showing extensive involvement with relapsed/refractory DLBCLand PET/CT with chest wall mass. Now s/p 2 cycles of GCD Likely CNS involvement by lymphoma at this time.  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 Creatinine at 1.89  #6 RT occular swelling/diplopia and occulomotor palsy with increasing chin numbness and back pain and left foot weakness. 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 Neurology agrees with this. MRI with contrast has been precluded due to renal function. 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 -I reviewed a Smear which shows leukemic phase of lymphoma well. -her functional status and other co-morbidities have and will continue to preclude tolerance of systemic or CNS directed lymphoma therapies for what has thus far been an aggressive and refractory lymphoma -appreciate hospital medicine/critical care/cardiology and neurology input -patient was recommended  -I discussed with the pt's husband that I would recommend a focus on keeping her comfortable and spending her remaining time with her family. He care needs suggest she would best be served by considering institutional hospice at [facility]. He is going to take some time to talk to the rest of their family to make this decision but feels he is now more ready to consider this decision.    I spent 30 minutes counseling the patient face to face. The total time spent in the appointment was 40 minutes and more than 50% was on counseling and direct patient cares.    Sullivan Lone MD [place] AAHIVMS [facility] Hematology/Oncology Physician [facility]  (Office):       [phone] (Work cell):  [phone] (Fax):           [phone]  04/11/2017 3:53 PM  This  document serves as a record of services personally performed by Sullivan Lone, MD. It was created on his behalf by Baldwin Jamaica, a trained medical scribe. The creation of this record is based on the scribe's personal observations and the provider's statements to them.   .I have reviewed the above documentation for accuracy and completeness, and I agree with the above. Sullivan Lone MD MS

## 2017-04-11 NOTE — Significant Event (Signed)
Rapid Response Event Note  Overview: Called regarding HR-220s SVT. BP-114/64.  Pt denies SOB or chest pain per RN. Pt had just being turned for patient care.  Pt HR-100s-130s t/o day. Cardizem IV converted to PO therapy 4/9 Time Called: [place] Time: 1835 Event Type: Cardiac  Initial Focused Assessment: Pt laying in bed shivering. HR now 90s Aflutter. BP-114/63, RR-29, SpO2-96% on RA. EKG strip during SVT episode reviewed d/t concern of pt shivering causing artifact. Rhythm SVT-200s during episode with no artifact present.  Interventions: Warm blanket placed by bedside RN. EKG-Aflutter. Digoxin ordered but not given d/t HR now down to 90s.  Plan of Care (if not transferred):  Continue to monitor pt.  Call RRT if further assistance needed.   Event Summary: Name of Physician Notified: Samtani at 1835    at    Outcome: Stayed in room and stabalized     [place], Carren Rang

## 2017-04-11 NOTE — Progress Notes (Signed)
Patient ID: Jo Collins, female   DOB: [birth year], 67 y.o.   MRN: [id number]  PROGRESS NOTE    Jo Collins  YWV:[id number] DOB: [birth year] DOA: 03/08/2017 PCP: Deland Pretty, MD   Brief Narrative:  67 year old female with history of paroxysmal atrial fibrillation not on anticoagulation, large B-cell lymphoma with ongoing chemotherapy and recent tumor lysis syndrome resulting in acute renal failure anemia and hospitalization, morbid obesity, chronic back pain, prior biopsy of the right iliac crest in January and ongoing issues with low back pain with radiation down to the left leg with weakness and numbness was admitted on 03/31/2017 with complaints of leg pain, diplopia.  She was admitted for A. fib with RVR and possible sepsis from pneumonia.  She was started on antibiotics.  Cardiology was consulted.  She had a TEE and cardioversion on 04/29/2017 but then converted back to A. fib.  Neurology was also consulted for neurological symptoms an MRI was ordered.  MRI could not be done at [facility] long hospital so she was transferred to [facility] on 04/03/2017.  She had LP done on 04/15/2017.  Oncology is also following.  Nephrology was consulted for worsening renal function and acidosis. PCCM was reconsulted for worsening hypoxia and have signed off.  Hypoxia resolved.  Ongoing issues with atrial flutter with RVR, remains on IV Cardizem drip, cardiology plans repeat cardioversion this week.  Pneumonia improving and transitioned to oral antibiotics.  Acute kidney injury improved and nephrology signed off 4/8.  Replacing electrolytes.  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  Palliative involved, DNI established.  Assessment & Plan:   Active Problems:   NHL (non-Hodgkin's lymphoma) (HCC)   Morbid obesity with BMI of 50.0-59.9, adult (HCC)   Diffuse large B cell lymphoma (HCC)   Recurrent right pleural effusion   Pleural effusion  Atrial fibrillation with tachycardic ventricular rate (HCC)   Atrial flutter (HCC)   Sepsis (HCC)   Leukocytosis   Acute on chronic renal failure (HCC)   HCAP (healthcare-associated pneumonia)   3rd nerve palsy, complete, right   Hyperuricemia   Acute respiratory failure with hypoxia (HCC)   AKI (acute kidney injury) ([place])   Palliative care encounter   DNI (do not intubate)   Counseling regarding advance care planning and goals of care   Pressure injury of skin  Sepsis Right-sided pneumonia with pleural effusion - Vancomycin discontinued on 04/04/2017.   - Completed 9 days of cefepime.  No culture data to guide. Transitioned to oral Augmentin 4/8 and complete total 14 d ending 4/11 -Repeat chest x-ray 4/8 shows persisting right lower lobe opacity.  New diarrhea-6 episodes overnight-flexiseal placed and came out Low grade temp 100.8 Testing for Cdiff as is on Abx--stopping amox today as nearing end date  Acute hypoxic respiratory failure  - Probably from combination of pneumonia/volume overload/A. fib with RVR -Had been on BiPAP (off since 4/4).  Hypoxia resolved and has been on room air for several days now.  Palliative consulted and DNI established. -Changed IV Solu-Medrol to PO prednisone 40 mg daily 4/9 and taper down. -no new changes today  Atrial flutter with RVR--control complicated by hyptoension - Underwent TEE and DCCV on 04/24/2017 but reverted to atrial flutter with RVR. -Continue IV heparin, IV Cardizem gtt.-->p.o. 4/9 amiodarone infusion changed to oral on 4/7 400 bd   -Did receive couple doses of IV digoxin but not continued.  Aggressively replacing multiple electrolyte deficiencies. Poor control today 4/10  and started on Cardizem 90 q6--might need Gtt but is hypotensive - IV Lasix changed to oral 80 mg twice daily and amiloride added by nephrology 4/8. - d/w Dr Marlou Porch 4/10--not ideal DCCV candidate--control with meds only -coniderin  Acute renal failure with  metabolic acidosis -Nephrology was consulted.  Etiology felt to be ischemic ATN-multifactorial.  Nonoliguric.  Briefly on IV fluids/bicarbonate drip, now off.  Briefly on IV Lasix. - UA was negative for infection and renal ultrasound was negative for hydronephrosis -Creatinine gradually improving.  Discussed with nephrology who have started on amiloride to help with hypokalemia/hypomagnesemia and have switched Lasix to oral. -Nephrology signed off 4/8.  Hypokalemia/hypomagnesemia/hypocalcemia -Replace and follow.   Right 3rd nerve palsy and left leg weakness in the setting of B-cell lymphoma - Status post LP done on 04/03/2017: No malignant cells.  Mixed lymphoid population.  CSF Gram stain and fungal stain negative.  CSF culture pending. -As per  Dr. Leonel Ramsay, Neurology, patient unable to tolerate performing MRI due to back pain and claustrophobia.  MRI that was done was a limited study and showed no acute findings.  Not candidate for performing under sedation due to concern for landing up on ventilator and unable to wean.  Per -Neurology follow-up and recommendations detailed in their note 4/6, please refer.  MRA head without acute findings.  No further workup recommended by Neurology.   -per Dr. Irene Limbo on 4/8.  He indicated that patient has received chemotherapy with CNS prophylaxis.  Her systemic disease has been refractory to systemic therapy.  She has overall poor prognosis.  Relapsed diffuse B-cell lymphoma -Oncology following. -Overall prognosis is guarded -Discussed with palliative care, now DNI and they will continue to follow.  Overall poor prognosis -Ipallaitve re-engaged on 4/10--patient wishes full scope Rx without intubation  Anemia and thrombocytopenia -Probably from lymphoma and chemotherapy -Transfuse to keep hemoglobin above 7.  Hemoglobin down to 7.1.  Transfused 1 unit PRBC 4/7.  Hemoglobin up to 8.4.  Platelet counts gradually drifting down/64 today.  No bleeding reported.   Next lab check 4/12  Morbid obesity -Outpatient follow-up  DVT prophylaxis: Heparin drip Code Status: DNI/partial Family Communication: Discussed with husband Disposition Plan: Depends on clinical outcome.  Not medically stable for discharge at this time  Consultants: Neurology/cardiology/PCCM/oncology/palliative care team.  Procedures: TEE/DCCV 04/24/2017 Mild biatrial dilation. Excellent left atrial appendage emptying velocities. No thrombus. LVEF>65%. Mild MR, mild TR. Normal aorta. No pericardial effusion.  Cardioverted1time(s).  Cardioversion with synchronized biphasic120Jshock.  Echo 04/01/17 Study Conclusions  - HPI and indications: Atrial flutter 427.32. - Procedure narrative: Transthoracic echocardiography. Image quality was poor. The study was technically difficult, as a result of body habitus. - Left ventricle: The cavity size was normal. Wall thickness was increased in a pattern of moderate LVH. Systolic function was vigorous. The estimated ejection fraction was 75%. The study is not technically sufficient to allow evaluation of LV diastolic function. - Mitral valve: Calcified annulus. Mildly thickened leaflets . At least mild stenosis. There was trivial regurgitation. Valve area by continuity equation (using LVOT flow): 1.68 cm^2. - Left atrium: Moderately dilated. - Atrial septum: There was increased thickness of the septum, consistent with lipomatous hypertrophy. - Tricuspid valve: There was mild regurgitation. - Pulmonary arteries: PA peak pressure: 32 mm Hg (S). - Inferior vena cava: The vessel was normal in size. The respirophasic diameter changes were in the normal range (>= 50%), consistent with normal central venous pressure.  Impressions:  - Technically difficult study. LVEF 75%, rapid atrial flutter  is noted, moderate LVH, normal wall motion, MAC with at least mild stenosis and trivial MR, moderae LAE, mild TR,  RVSP 32 mmHg, normal IVC, no pericardial effusion.   Antimicrobials:  Cefepime from 03/06/2017 onwards >4/6 Vancomycin from 03/27/2017-04/03/17 Oral Augmentin 4/7 >  Subjective:  sleepy most of day Husband bedside Orients No cp no n/v  Objective: Vitals:   04/11/17 0822 04/11/17 1301 04/11/17 1322 04/11/17 1615  BP: 99/72 (!) 87/53 108/73 114/64  Pulse: (!) 115 (!) 129  (!) 108  Resp: (!) 25 20  (!) 22  Temp: 100 F (37.8 C) (!) 100.8 F (38.2 C)  (!) 100.4 F (38 C)  TempSrc: Axillary Axillary  Oral  SpO2: 94% 96%  98%  Weight:      Height:        Intake/Output Summary (Last 24 hours) at 04/11/2017 1759 Last data filed at 04/11/2017 1039 Gross per 24 hour  Intake 806 ml  Output 0 ml  Net 806 ml   Filed Weights   03/13/2017 1855 04/03/17 0358 04/09/17 0500  Weight: 115.5 kg (254 lb 10.1 oz) 124.8 kg (275 lb 2.2 oz) (!) 161.5 kg (356 lb)    Examination:  Flat affect obese periorbital edema no thrush no JVD no bruit S1-S2 tachycardic-heart rate 10's Chest is clear Abdomen is soft slight tender No rebound Neuro intact moving all 4 extremities Some swelling in LE's   Data Reviewed: I have personally reviewed following labs and imaging studies  CBC: Recent Labs  Lab 04/06/17 0649  04/07/17 0540 04/08/17 0306 04/09/17 0508 04/10/17 0425 04/11/17 0500  WBC 46.5*   < > 35.8* 34.6* 32.6* 36.2* 35.0*  NEUTROABS 39.1*  --   --   --   --   --   --   HGB 7.7*   < > 7.1* 7.9* 8.4* 8.9* 9.4*  HCT 22.2*   < > 20.4* 23.5* 25.0* 27.7* 28.9*  MCV 78.7   < > 80.6 82.2 84.5 85.5 86.5  PLT 99*   < > 70* 69* 63* 64* 56*   < > = values in this interval not displayed.   Basic Metabolic Panel: Recent Labs  Lab 04/07/17 0236 04/07/17 0540 04/08/17 0306 04/09/17 0508 04/10/17 0425 04/11/17 0500  NA 140 140 139 139 139 138  K 2.5* 3.2* 3.2* 3.4* 3.8 4.6  CL 99* 98* 96* 96* 97* 97*  CO2 27 27 27 29 29 25   GLUCOSE 139* 146* 143* 122* 118* 74  BUN 36* 38* 38* 35* 31*  25*  CREATININE 2.10* 2.36* 2.33* 2.15* 1.96* 1.89*  CALCIUM 5.4* 6.1* 5.5* 5.9* 6.4* 6.8*  MG 1.2* 1.4* 1.5* 1.6* 1.7  --   PHOS 2.2*  --  3.6 2.7 1.8* 1.5*   GFR: Estimated Creatinine Clearance: 43.1 mL/min (A) (by C-G formula based on SCr of 1.89 mg/dL (H)). Liver Function Tests: Recent Labs  Lab 04/07/17 0236 04/08/17 0306 04/09/17 0508 04/10/17 0425 04/11/17 0500  ALBUMIN 1.9* 2.2* 2.4* 2.4* 2.5*   Sepsis Labs: Recent Labs  Lab 04/04/17 2140  LATICACIDVEN 1.9    Recent Results (from the past 240 hour(s))  Fungus Stain     Status: None   Collection Time: 04/03/17  1:47 PM  Result Value Ref Range Status   FUNGUS STAIN Final report  Final    Comment: (NOTE) Performed At: [facility] [place], [place] [id number] Rush Farmer MD [id number]    Fungal Source CSF  Final    Comment:  Performed at [facility], [place] [address], [place], [place] [address]  Fungal Stain reflex     Status: None   Collection Time: 04/03/17  1:47 PM  Result Value Ref Range Status   Fungal stain result 1 Comment  Final    Comment: (NOTE) KOH/Calcofluor preparation:  no fungus observed. Performed At: [facility] [place], [place] [id number] Rush Farmer MD [id number] Performed at [facility], [place] [address] [place], [place] [address]   CSF culture     Status: None (Preliminary result)   Collection Time: 04/03/17  1:54 PM  Result Value Ref Range Status   Specimen Description CSF  Final   Special Requests NONE  Final   Gram Stain   Final    NO WBC SEEN NO ORGANISMS SEEN CYTOSPIN SMEAR Gram Stain Report Called to,Read Back By and Verified With: E.MATHAI AT 1618 ON 04/03/17 BY N.THOMPSON Performed at [facility], [place] [address], [place], [place] [address]    Culture PENDING  Incomplete   Report Status PENDING  Incomplete         Radiology Studies: No results  found.      Scheduled Meds: . allopurinol  100 mg Oral BID  . aMILoride  5 mg Oral Daily  . amiodarone  400 mg Oral BID  . amoxicillin-clavulanate  500 mg Oral BID  . calcium carbonate  500 mg of elemental calcium Oral TID WC  . diltiazem  90 mg Oral Q6H  . furosemide  80 mg Oral BID  . magnesium oxide  400 mg Oral Daily  . metoprolol tartrate  25 mg Oral Q6H  . nutrition supplement (JUVEN)  1 packet Oral BID BM  . potassium chloride  40 mEq Oral TID  . predniSONE  40 mg Oral Q breakfast  . sodium chloride flush  10-40 mL Intracatheter Q12H  . sodium chloride flush  3 mL Intravenous Q12H   Continuous Infusions: . heparin 900 Units/hr (04/10/17 0900)     LOS: 12 days     Verneita Griffes, MD [facility] ([phone]   If 7PM-7AM, please contact night-coverage [email or web address] Password [email or web address] 04/11/2017, 5:59 PM

## 2017-04-11 NOTE — Progress Notes (Signed)
Pt turned onto side during pt care and pt heart rate jumped into the 220s sustained. Pt denied chest pain or any palpitations. Rapid response RN called. Verlon Au, MD paged. 12 lead obtained. Pt heart rate back into 90s-100s. New orders placed. RN will continue to monitor.

## 2017-04-11 NOTE — Progress Notes (Signed)
Progress Note  Patient Name: Decie Verne Date of Encounter: 04/11/2017  Primary Cardiologist: No primary care provider on file.   Subjective   Changed to PO yesterday, diltiazem. Will increase dose. No CP, no SOB.   Inpatient Medications    Scheduled Meds: . allopurinol  100 mg Oral BID  . aMILoride  5 mg Oral Daily  . amiodarone  400 mg Oral BID  . amoxicillin-clavulanate  500 mg Oral BID  . calcium carbonate  500 mg of elemental calcium Oral TID WC  . diltiazem  90 mg Oral Q6H  . furosemide  80 mg Oral BID  . magnesium oxide  400 mg Oral Daily  . metoprolol tartrate  25 mg Oral Q6H  . nutrition supplement (JUVEN)  1 packet Oral BID BM  . potassium chloride  40 mEq Oral TID  . predniSONE  40 mg Oral Q breakfast  . protein supplement shake  11 oz Oral BID BM  . sodium chloride flush  10-40 mL Intracatheter Q12H  . sodium chloride flush  3 mL Intravenous Q12H   Continuous Infusions: . heparin 900 Units/hr (04/10/17 0900)   PRN Meds: acetaminophen, fentaNYL (SUBLIMAZE) injection, levalbuterol, LORazepam, ondansetron (ZOFRAN) IV, sodium chloride flush, sodium chloride flush, traZODone   Vital Signs    Vitals:   04/10/17 1753 04/10/17 1903 04/10/17 2305 04/11/17 0400  BP: 119/78 119/62 (!) 156/78 121/63  Pulse:  94 62 (!) 117  Resp:  19 18 18   Temp:  97.9 F (36.6 C) 98 F (36.7 C) 98.5 F (36.9 C)  TempSrc:  Oral Axillary Oral  SpO2:  97% 100%   Weight:      Height:        Intake/Output Summary (Last 24 hours) at 04/11/2017 0803 Last data filed at 04/11/2017 0300 Gross per 24 hour  Intake 911 ml  Output 500 ml  Net 411 ml   Filed Weights   03/07/2017 1855 04/03/17 0358 04/09/17 0500  Weight: 254 lb 10.1 oz (115.5 kg) 275 lb 2.2 oz (124.8 kg) (!) 356 lb (161.5 kg)    Telemetry    AFIB/fluuter 100-120's - Personally Reviewed  ECG    AFIB - Personally Reviewed  Physical Exam   GEN: Well nourished, well developed, in no acute distress, obese HEENT:  normal  Neck: no JVD, carotid bruits, or masses Cardiac: tachy irreg; no murmurs, rubs, or gallops,no edema  Respiratory:  clear to auscultation bilaterally, normal work of breathing GI: soft, nontender, nondistended, + BS MS: no deformity or atrophy  Skin: warm and dry, no rash Neuro:  sedate Psych: sedate   Labs    Chemistry Recent Labs  Lab 04/04/17 0917  04/08/17 0306 04/09/17 0508 04/10/17 0425  NA 140   < > 139 139 139  K 3.8   < > 3.2* 3.4* 3.8  CL 116*   < > 96* 96* 97*  CO2 13*   < > 27 29 29   GLUCOSE 94   < > 143* 122* 118*  BUN 30*   < > 38* 35* 31*  CREATININE 2.41*   < > 2.33* 2.15* 1.96*  CALCIUM 8.0*   < > 5.5* 5.9* 6.4*  PROT 4.8*  --   --   --   --   ALBUMIN 2.4*   < > 2.2* 2.4* 2.4*  AST 47*  --   --   --   --   ALT 23  --   --   --   --  ALKPHOS 101  --   --   --   --   BILITOT 0.8  --   --   --   --   GFRNONAA 20*   < > 21* 23* 25*  GFRAA 23*   < > 24* 26* 29*  ANIONGAP 11   < > 16* 14 13   < > = values in this interval not displayed.     Hematology Recent Labs  Lab 04/09/17 0508 04/10/17 0425 04/11/17 0500  WBC 32.6* 36.2* 35.0*  RBC 2.96* 3.24* 3.34*  HGB 8.4* 8.9* 9.4*  HCT 25.0* 27.7* 28.9*  MCV 84.5 85.5 86.5  MCH 28.4 27.5 28.1  MCHC 33.6 32.1 32.5  RDW 19.7* 20.1* 20.9*  PLT 63* 64* PENDING    Cardiac EnzymesNo results for input(s): TROPONINI in the last 168 hours. No results for input(s): TROPIPOC in the last 168 hours.   BNPNo results for input(s): BNP, PROBNP in the last 168 hours.   DDimer No results for input(s): DDIMER in the last 168 hours.   Radiology    Dg Chest Port 1 View  Result Date: 04/09/2017 CLINICAL DATA:  Shortness of Breath EXAM: PORTABLE CHEST 1 VIEW COMPARISON:  04/06/2017 FINDINGS: Right Port-A-Cath remains in place, unchanged. Continued right lower lobe opacity with blunting of the right costophrenic angle, stable. Left lung clear. Heart is upper limits normal in size. IMPRESSION: Continued right  lower lobe airspace opacity and blunting of the right costophrenic angle, unchanged. Electronically Signed   By: Rolm Baptise M.D.   On: 04/09/2017 09:55    Cardiac Studies   Ejection fraction 75%  Patient Profile     67 y.o. female with diffuse large cell B-cell lymphoma, atrial fibrillation/atrial flutter post cardioversion with acute kidney injury, pancytopenia, morbid obesity.  Assessment & Plan    Atrial fibrillation/flutter - P.o. amiodarone started on 04/08/2017.  400 mg twice a day. - Increased p.o. diltiazem to 90 mg p.o. every 6 hours. -Currently on IV heparin for anticoagulation here in the hospital but is not on anticoagulation as an outpatient due to pancytopenia, risk greater than benefit.  - it seems reasonable to continue with rate control. Even with attempt at cardioversion with amiodarone, there is high likelihood that at some point she will return to atrial fib/flutter and if she is not having any serious symptoms from this or if we are not having difficultly with rate control, I would try to avoid further procedures, especially with refractory lymphoma.   AKI  - creat improving, 1.96  - lasix 80 BID  Morbid obesity  For questions or updates, please contact [place] Please consult [email or web address] for contact info under Cardiology      Signed, Candee Furbish, MD  04/11/2017, 8:03 AM

## 2017-04-11 NOTE — Progress Notes (Signed)
[place] for heparin Indication: atrial fibrillation  No Known Allergies  Patient Measurements: Height: 5\' 1"  (154.9 cm) Weight: (!) 356 lb (161.5 kg) IBW/kg (Calculated) : 47.8 Heparin Dosing Weight: 76 kg  Vital Signs: Temp: 100 F (37.8 C) (04/10 0822) Temp Source: Axillary (04/10 0822) BP: 99/72 (04/10 0822) Pulse Rate: 115 (04/10 0822)  Labs: Recent Labs    04/09/17 0508 04/10/17 0425 04/11/17 0500  HGB 8.4* 8.9* 9.4*  HCT 25.0* 27.7* 28.9*  PLT 63* 64* 56*  HEPARINUNFRC 0.59 0.54 0.45  CREATININE 2.15* 1.96* 1.89*     Assessment: Patient is a 67 y.o. F with hx lymphoma currently undergoing chemotherapy treatment presented to the ED on 03/26/2017 with afib with RVR. On heparin for afib/aflutter. Cardioversion 4/1 then back to afib no on oral amiodarone -heparin level= 0.45, plt= 56 (low/stable)  Goal of Therapy:  Heparin level 0.3-0.7 units/ml Monitor platelets by anticoagulation protocol: Yes   Plan Continue heparin gtt at 900 units/hr Monitor daily heparin level, plts closely  Hildred Laser, PharmD Clinical Pharmacist Clinical phone from 8:30-4:00 is [phone] After 4pm, please call Main Rx [phone]) for assistance. 04/11/2017 10:27 AM

## 2017-04-11 NOTE — Progress Notes (Signed)
Brief Nutrition Follow-Up Note  RD last evaluated by RD on 04/10/17; see follow-up note for further details.   Case discussed with RN, who reports pt continues to have poor oral intake. Pt does not like Premier Protein supplement; RD to d/c. Pt is consuming Juven supplement per RN. Will also order Magic Cup TID (each supplement provides 290 kcals and 9 grams protein).   Per chart review, palliative care to re-engage regarding goals of care (pt husband potentially interested in home hospice evaluation).   RD will continue to follow.   Kiran Lapine A. Jimmye Norman, RD, LDN, CDE Pager: [phone] After hours Pager: [phone]

## 2017-04-12 ENCOUNTER — Inpatient Hospital Stay (HOSPITAL_COMMUNITY): Payer: Medicare Other

## 2017-04-12 ENCOUNTER — Ambulatory Visit: Payer: Medicare Other | Admitting: Hematology

## 2017-04-12 ENCOUNTER — Other Ambulatory Visit: Payer: Medicare Other

## 2017-04-12 DIAGNOSIS — Z789 Other specified health status: Secondary | ICD-10-CM

## 2017-04-12 LAB — RENAL FUNCTION PANEL
ALBUMIN: 2.5 g/dL — AB (ref 3.5–5.0)
Anion gap: 16 — ABNORMAL HIGH (ref 5–15)
BUN: 33 mg/dL — ABNORMAL HIGH (ref 6–20)
CALCIUM: 7.1 mg/dL — AB (ref 8.9–10.3)
CO2: 20 mmol/L — ABNORMAL LOW (ref 22–32)
Chloride: 101 mmol/L (ref 101–111)
Creatinine, Ser: 2.73 mg/dL — ABNORMAL HIGH (ref 0.44–1.00)
GFR, EST AFRICAN AMERICAN: 20 mL/min — AB (ref >60)
GFR, EST NON AFRICAN AMERICAN: 17 mL/min — AB (ref >60)
Glucose, Bld: 124 mg/dL — ABNORMAL HIGH (ref 65–99)
PHOSPHORUS: 3.1 mg/dL (ref 2.5–4.6)
Potassium: 7.1 mmol/L (ref 3.5–5.1)
SODIUM: 137 mmol/L (ref 135–145)

## 2017-04-12 LAB — MAGNESIUM: Magnesium: 1.8 mg/dL (ref 1.7–2.4)

## 2017-04-12 LAB — HEPARIN LEVEL (UNFRACTIONATED): Heparin Unfractionated: 0.68 IU/mL (ref 0.30–0.70)

## 2017-04-12 LAB — CBC
HCT: 27.8 % — ABNORMAL LOW (ref 36.0–46.0)
HEMOGLOBIN: 8.8 g/dL — AB (ref 12.0–15.0)
MCH: 28.4 pg (ref 26.0–34.0)
MCHC: 31.7 g/dL (ref 30.0–36.0)
MCV: 89.7 fL (ref 78.0–100.0)
Platelets: 61 10*3/uL — ABNORMAL LOW (ref 150–400)
RBC: 3.1 MIL/uL — AB (ref 3.87–5.11)
RDW: 21.7 % — ABNORMAL HIGH (ref 11.5–15.5)
WBC: 37.5 10*3/uL — AB (ref 4.0–10.5)

## 2017-04-12 MED ORDER — ALBUTEROL SULFATE (2.5 MG/3ML) 0.083% IN NEBU: 2.5000 mg | INHALATION_SOLUTION | RESPIRATORY_TRACT | Status: DC

## 2017-04-12 MED ORDER — SODIUM POLYSTYRENE SULFONATE PO POWD
15.0000 g | Freq: Once | ORAL | Status: DC
  Filled 2017-04-12: qty 15

## 2017-04-12 MED ORDER — CALCIUM CHLORIDE 10 % IV SOLN
1.0000 g | Freq: Once | INTRAVENOUS | Status: DC
  Filled 2017-04-12: qty 10

## 2017-04-12 MED ORDER — FUROSEMIDE 10 MG/ML IJ SOLN
40.0000 mg | Freq: Once | INTRAMUSCULAR | Status: AC
  Administered 2017-04-12: 40 mg via INTRAVENOUS

## 2017-04-15 ENCOUNTER — Other Ambulatory Visit: Payer: Self-pay | Admitting: Hematology

## 2017-04-24 LAB — CSF CULTURE: GRAM STAIN: NONE SEEN

## 2017-05-02 NOTE — Progress Notes (Signed)
Responded to unit page to support family at bedside. Patient actively dying.  Patient passed .  Lots of family  Celebrating her life and legacy. Provided listening, prayer ,emotional and grief support. Supported Biochemist, clinical as needed.

## 2017-05-02 NOTE — Progress Notes (Signed)
Pt was asystole around 1130 and Dr. Verlon Au notified this matter and announced time of death at 62. [facility] and got reference number. Family wanted to have more time with the body and awaiting for her son to come. HS [employer]

## 2017-05-02 NOTE — Progress Notes (Signed)
Informed by RN HR 20's Patient re-eval--actively dying, sighing deep respirations 1 q 30 sec Expect imminent demise-condolences expressed to family  See death summary  1 hour critical care time and 30 min extended care time.  Verneita Griffes, MD [facility] [phone]

## 2017-05-02 NOTE — Progress Notes (Signed)
Pt husband refuse flexiseal this AM would rather pt receive med for diarrhea. Pt noted to brady to 48 later this AM. Provider made aware.

## 2017-05-02 NOTE — Progress Notes (Signed)
[place] for heparin Indication: atrial fibrillation  No Known Allergies  Patient Measurements: Height: 5\' 1"  (154.9 cm) Weight: (!) 356 lb (161.5 kg) IBW/kg (Calculated) : 47.8 Heparin Dosing Weight: 76 kg  Vital Signs: Temp: 99.8 F (37.7 C) (04/11 0909) Temp Source: Axillary (04/11 0909) BP: 86/56 (04/11 0931) Pulse Rate: 55 (04/11 0926)  Labs: Recent Labs    04/10/17 0425 04/11/17 0500 30-Apr-2017 0500  HGB 8.9* 9.4* 8.8*  HCT 27.7* 28.9* 27.8*  PLT 64* 56* 61*  HEPARINUNFRC 0.54 0.45 0.68  CREATININE 1.96* 1.89* 2.73*     Assessment: Patient is a 67 y.o. F with hx lymphoma currently undergoing chemotherapy treatment presented to the ED on 03/05/2017 with afib with RVR. On heparin for afib/aflutter. Cardioversion 4/1 then back to afib now on oral amiodarone. She is noted with AKI and hyperkalemia. No plans for escalation of care.  -heparin level= 0.68, plt= 61 (low/stable)  Goal of Therapy:  Heparin level 0.3-0.7 units/ml Monitor platelets by anticoagulation protocol: Yes   Plan Decrease heparin to 800 units/hr to keep towards the lower end of goal Monitor daily heparin level, plts closely  Hildred Laser, PharmD Clinical Pharmacist Clinical phone from 8:30-4:00 is [phone] After 4pm, please call Main Rx [phone]) for assistance. 04-30-17 10:16 AM

## 2017-05-02 NOTE — Progress Notes (Signed)
OT Cancellation Note  Patient Details Name: Jo Collins MRN: [id number] DOB: 1950/12/03   Cancelled Treatment:    Reason Eval/Treat Not Completed: Patient at procedure or test/ unavailable. Pt getting XRay. Will continue to follow.   [place] 2017-04-26, 8:55 AM  Hulda Humphrey OTR/L [phone]

## 2017-05-02 NOTE — Progress Notes (Signed)
   This morning she is hypotensive.  She is now on BiPAP.  Respiratory rate has increased.  She is demonstrating hyperkalemia as well as acute kidney injury.  There is no evidence of widening of the QRS on telemetry.  Interestingly, she converted to sinus rhythm/sinus bradycardia rate in the 50s.  She has been on several days of IV amiodarone as well as p.o. amiodarone.  Yesterday her diltiazem was increased to 90 mg every 6 hours and she was given a few doses of IV digoxin because of rapid heart rates while turning.  Given her current acute deterioration, both myself and Dr. Verlon Au had lengthy family conversation about goals of care and discussed DNR.  We all agreed upon changing CODE STATUS to DNR based upon her long-term survival.  Family is in agreement.  We are not going to escalate care.  She has been given calcium gluconate to help stabilize her myocardium.  EKG does not demonstrate any significant changes of hyperkalemia.  Critical care time 35 minutes spent with patient, patient family, care team with end-of-life discussion surrounding Ms. Moen, critically ill with multiple system failure.  Candee Furbish, MD

## 2017-05-02 NOTE — Progress Notes (Signed)
Pt's family member was ready to leave and clean the body and waiting for Bed Control to call. HS [employer]

## 2017-05-02 NOTE — Progress Notes (Addendum)
Patient seen and examined please see my full note for my thoughts and interpretations of her condition Patient looks much more ill than prior heart rate is now lower probably secondary to the digoxin and her potassium is 7.1 I have had a long discussion with her husband at the bedside who confirms that patient is partial code and that family would want everything up until intubation and mechanical ventilation-I have explained the futility of only doing 1 or 2 of these measures with regards to ACLS   BP 97/76 (BP Location: Left Leg)   Pulse (!) 54   Temp 99.8 F (37.7 C) (Axillary)   Resp (!) 28   Ht 5\' 1"  (1.549 m)   Wt (!) 161.5 kg (356 lb)   SpO2 100%   BMI 67.27 kg/m   Patient is moaning and groaning breathing about 40 times a minute She is unable to verbalize well S1-S2 bradycardic Abdomen obese nontender nondistended No lower extremity edema  P Treat emergently hyperkalemia with Kayexalate if possible however she is unable to safely take p.o. at this time therefore giving Lasix 40 x 1 as well as calcium IV x1 Placing on BiPAP given work of breathing Considering blood gas in about an hour if respiratory status and work of breathing calm down   Patient's family is waiting for son and daughters to weigh and but if the patient were to decompensate we would call a code and get critical care involved-at this juncture her situation is very tenuous    I spent 30 minutes direct critical care time with this patient.  Verneita Griffes, MD [facility] [phone]

## 2017-05-02 NOTE — Discharge Summary (Signed)
Jo Collins  Jo Collins POE:[id number] DOB: 09/18/1950 DOA: Apr 17, 2017  PCP: Deland Pretty, MD  Admit date: 04/17/17 Date of Jo: May 05, 2017 Time of Jo: 11:30 Notification: Deland Pretty, MD notified of Jo of 05/05/17   History of present illness:  Jo Collins is a 67 y.o. female with a history of  [name] presented with complaint of acute hypoxic respiratory failure secondary to heart failure as well as AE COPD and was on BiPAP on admission she also was found to have nonoliguric renal failure as well as disc electrolyte anemia and a right-sided pneumonia with pleural effusion We had palliative discussions with the patient and she declined to address her underlying severe illness On the morning of 04-30-17 she became severely tachypneic and had altered mental status and I discussed clearly with her husband seriously ill state Cardiology saw her at the same time on that morning and it was elected by family after discussion amongst myself and the cardiologist and the patient's husband to change her CODE STATUS to DNR She passed away 11:30 AM  Final Diagnoses:  1.   Acute hypoxic respiratory failure secondary to right-sided pneumonia and pleural effusion, decompensated heart failure, acute renal failure with metabolic acidosis, sepsis, A. fib with RVR  Patient was pronounced by me no chest rise, no discernible heart beat, pupils nonreactive to light   The results of significant diagnostics from this hospitalization (including imaging, microbiology, ancillary and laboratory) are listed below for reference.    Significant Diagnostic Studies: Ct Head Wo Contrast  Result Date: 04/17/17 CLINICAL DATA:  Double vision. EXAM: CT HEAD WITHOUT CONTRAST TECHNIQUE: Contiguous axial images were obtained from the base of the skull through the vertex without intravenous contrast. COMPARISON:  MRI of February 06, 2017. FINDINGS: Brain: Mild chronic ischemic white matter disease is  noted. No mass effect or midline shift is noted. Ventricular size is within normal limits. There is no evidence of mass lesion, hemorrhage or acute infarction. Vascular: No hyperdense vessel or unexpected calcification. Skull: Normal. Negative for fracture or focal lesion. Sinuses/Orbits: No acute finding. Other: None. IMPRESSION: Mild chronic ischemic white matter disease. No acute intracranial abnormality seen. Electronically Signed   By: Marijo Conception, M.D.   On: 04-17-2017 15:01   Mr Jodene Nam Head Wo Contrast  Result Date: 04/07/2017 CLINICAL DATA:  Lower extremity weakness. Right third nerve palsy. Sepsis. Progression of lymphoma. EXAM: MRA HEAD WITHOUT CONTRAST TECHNIQUE: Angiographic images of the Circle of Willis were obtained using MRA technique without intravenous contrast. COMPARISON:  04/05/2017 FINDINGS: Brain: Brain has normal appearance without evidence of malformation, atrophy, old or acute small or large vessel infarction, mass lesion, hemorrhage, hydrocephalus or extra-axial collection. Vascular: Major vessels at the base of the brain show flow. Venous sinuses appear patent. Skull and upper cervical spine: Normal. Sinuses/Orbits: Clear/normal. Other: None significant. IMPRESSION: Normal intracranial MR angiography of the large and medium size vessels. Electronically Signed   By: Nelson Chimes M.D.   On: 04/07/2017 17:07   Mr Brain Wo Contrast  Result Date: 04/05/2017 CLINICAL DATA:  Recurrent B-cell lymphoma, assess for brain metastasis. EXAM: MRI HEAD WITHOUT CONTRAST TECHNIQUE: Axial diffusion weighted imaging, axial T2 FLAIR, axial T2 and axial T2 SWAN on a 3 tesla scanner. Patient was unable to tolerate further imaging. COMPARISON:  CT HEAD 17-Apr-2017 and MRI of the head February 07, 2016 FINDINGS: Sequences vary from moderate to severely motion degraded. INTRACRANIAL CONTENTS: No reduced diffusion to suggest acute ischemia or hypercellular tumor. No susceptibility  artifact to suggest  lobar hemorrhage though motion limits sensitivity for micro hemorrhages. The ventricles and sulci are normal for patient's age. Stable patchy supratentorial white matter FLAIR T2 hyperintensities. No suspicious parenchymal signal, definite masses, mass effect. No abnormal extra-axial fluid collections. No extra-axial masses. VASCULAR: Normal major intracranial vascular flow voids present at skull base. SKULL AND UPPER CERVICAL SPINE: Empty sella better demonstrated on prior MRI. No suspicious calvarial bone marrow signal. Craniocervical junction maintained. SINUSES/ORBITS: The mastoid air-cells and included paranasal sinuses are well-aerated.The included ocular globes and orbital contents are non-suspicious. OTHER: None. IMPRESSION: 1. Limited noncontrast motion degraded 4 sequence MRI of the head. 2. No acute intracranial process or definite intracranial lymphoma though contrast enhanced sequences are more sensitive. 3. Stable mild-to-moderate chronic small vessel ischemic disease. 4. Stable empty sella. Electronically Signed   By: Elon Alas M.D.   On: 04/05/2017 16:16   [place] Chest (pleural Effusion)  Result Date: 04/04/2017 CLINICAL DATA:  Follow-up right pleural effusion EXAM: CHEST ULTRASOUND COMPARISON:  None. FINDINGS: Small right pleural effusion is noted. No sizable component to allow for safe thoracentesis is noted. IMPRESSION: Small right-sided pleural effusion. Electronically Signed   By: Inez Catalina M.D.   On: 04/04/2017 15:10   US Renal  Result Date: 04/01/2017 CLINICAL DATA:  Acute renal failure. EXAM: RENAL / URINARY TRACT ULTRASOUND COMPLETE COMPARISON:  None. FINDINGS: Right Kidney: Length: 10.6 cm. Echogenicity within normal limits. No mass or hydronephrosis visualized. Left Kidney: Length: 11.3 cm. Echogenicity within normal limits. No mass or hydronephrosis visualized. Bladder: Appears normal for degree of bladder distention. IMPRESSION: Normal renal ultrasound. Electronically  Signed   By: Fidela Salisbury M.D.   On: 04/01/2017 01:21   Dg Chest Port 1 View  Result Date: 04/22/17 CLINICAL DATA:  Evaluate fluid overload. Hx A-Fib with tachycardia, Non-Hodgkin's Lymphoma, and Recurrent Right Sided Peural Effusion EXAM: PORTABLE CHEST 1 VIEW COMPARISON:  04/09/2017 FINDINGS: Right lower lung zone interstitial and hazy airspace opacities are without significant change from the most recent prior exam allowing for differences in patient positioning and technique. Probable small right effusion. Left lung remains clear. Right anterior chest wall Port-A-Cath is stable. IMPRESSION: 1. No significant change from the most recent prior exam with persistent right lower lung zone opacities and probable small right effusion. No new abnormalities. Electronically Signed   By: Lajean Manes M.D.   On: 22-Apr-2017 09:19   Dg Chest Port 1 View  Result Date: 04/09/2017 CLINICAL DATA:  Shortness of Breath EXAM: PORTABLE CHEST 1 VIEW COMPARISON:  04/06/2017 FINDINGS: Right Port-A-Cath remains in place, unchanged. Continued right lower lobe opacity with blunting of the right costophrenic angle, stable. Left lung clear. Heart is upper limits normal in size. IMPRESSION: Continued right lower lobe airspace opacity and blunting of the right costophrenic angle, unchanged. Electronically Signed   By: Rolm Baptise M.D.   On: 04/09/2017 09:55   Dg Chest Port 1 View  Result Date: 04/06/2017 CLINICAL DATA:  Respiratory failure.  Shortness of breath. EXAM: PORTABLE CHEST 1 VIEW COMPARISON:  04/05/2017; 04/04/2017; 10/10/2026 chest CT-02/06/2017 FINDINGS: Grossly unchanged cardiac silhouette and mediastinal contours. Stable positioning of support apparatus. Improved aeration of lung bases with persistent right heterogeneous/consolidative opacities and suspected trace right-sided effusion. No new focal airspace opacities. No pneumothorax. No evidence of edema. No acute osseus abnormalities. IMPRESSION: Improved  aeration the lung bases with persistent right basilar opacities and suspected trace right-sided effusion, similar to the 10/2016 examination and thus favored to represent atelectasis or scar  Electronically Signed   By: Sandi Mariscal M.D.   On: 04/06/2017 09:30   Dg Chest Port 1 View  Result Date: 04/05/2017 CLINICAL DATA:  Respiratory failure short of breath EXAM: PORTABLE CHEST 1 VIEW COMPARISON:  04/04/2017 FINDINGS: Right jugular central venous catheter tip in the SVC unchanged. Extensive right lower lobe infiltrate unchanged with small right effusion. Mild left lower lobe atelectasis/infiltrate unchanged. IMPRESSION: Right lower lobe infiltrate unchanged, possible pneumonia. Small right effusion unchanged Left lower lobe atelectasis/infiltrate unchanged. Electronically Signed   By: Franchot Gallo M.D.   On: 04/05/2017 07:50   Dg Chest Port 1 View  Result Date: 04/04/2017 CLINICAL DATA:  Shortness of breath.  History of large cell lymphoma EXAM: PORTABLE CHEST 1 VIEW COMPARISON:  March 30, 2017 FINDINGS: Port-A-Cath tip is in the superior vena cava. No pneumothorax. There is a right pleural effusion with patchy airspace opacity throughout the right mid lower lung zones. The left lung is clear. Heart is upper normal in size with pulmonary vascularity within normal limits. No adenopathy evident. No bone lesions. IMPRESSION: Persistent pleural effusion on the right with areas of consolidation in portions of the right mid lower lung zones. Left lung remains clear. Stable cardiac silhouette. No new opacity evident. Electronically Signed   By: Lowella Grip III M.D.   On: 04/04/2017 09:20   Dg Chest Port 1 View  Result Date: 03/06/2017 CLINICAL DATA:  Tachycardia EXAM: PORTABLE CHEST 1 VIEW COMPARISON:  March 17, 2017 FINDINGS: Port-A-Cath tip is in the superior vena cava. No pneumothorax. There is airspace consolidation in the right lower lobe with small right pleural effusion. Left lung is clear. Heart is  borderline enlarged with pulmonary vascularity within normal limits. No adenopathy. No bone lesions. IMPRESSION: Airspace consolidation consistent with pneumonia right lower lung zone with small right pleural effusion. Left lung clear. Stable cardiac silhouette. Port-A-Cath tip in superior vena cava. No pneumothorax. Electronically Signed   By: Lowella Grip III M.D.   On: 03/27/2017 14:06   Dg Fluoro Guide Lumbar Puncture  Result Date: 04/03/2017 CLINICAL DATA:  67 year old female with history of lymphoma and intrathecal chemotherapy presents with double vision, lower extremity weakness, atrial fibrillation with RVR. EXAM: DIAGNOSTIC LUMBAR PUNCTURE UNDER FLUOROSCOPIC GUIDANCE COMPARISON:  Lumbar MRI 02/08/2017. CT Abdomen and Pelvis 02/06/2017. FLUOROSCOPY TIME:  Fluoroscopy Time:  0 minutes 18 seconds Radiation Exposure Index (if provided by the fluoroscopic device): 2.8 mGy Number of Acquired Spot Images: 0 PROCEDURE: Informed consent was obtained from the patient prior to the procedure, including potential complications of headache, allergy, and pain. A "time-out" was performed. With the patient prone, the lower back was prepped with Betadine. 1% Lidocaine was used for local anesthesia. Lumbar puncture was performed at the L2-L3 level using a 6 in x 20 gauge needle with return of slightly yellow tinged CSF. The CSF color appeared constant throughout the 4 tubes. Due to immobility, CSF opening pressure with the patient decubitus was not performed, but subjectively the CSF pressure was normal, with slow CSF flow through the 6 in spinal needle. 17 mL of CSF were obtained for laboratory studies. The patient tolerated the procedure well and there were no apparent complications. Appropriate post procedural orders were placed on the chart. The patient was returned to the inpatient floor in stable condition for continued treatment. IMPRESSION: Fluoroscopic guided lumbar puncture at L2-L3. 17 mL of yellow-tinged  CSF were obtained for laboratory studies. Electronically Signed   By: Genevie Ann M.D.   On: 04/03/2017  14:05    Microbiology: Recent Results (from the past 240 hour(s))  C difficile quick scan w PCR reflex     Status: None   Collection Time: 04/11/17  6:54 PM  Result Value Ref Range Status   C Diff antigen NEGATIVE NEGATIVE Final   C Diff toxin NEGATIVE NEGATIVE Final   C Diff interpretation No C. difficile detected.  Final    Comment: NEGATIVE     Labs: Basic Metabolic Panel: Recent Labs  Lab 04/11/17 0500 04/19/17 0500  NA 138 137  K 4.6 7.1*  CL 97* 101  CO2 25 20*  GLUCOSE 74 124*  BUN 25* 33*  CREATININE 1.89* 2.73*  CALCIUM 6.8* 7.1*  MG  --  1.8  PHOS 1.5* 3.1   Liver Function Tests: Recent Labs  Lab 04/11/17 0500 04-19-2017 0500  ALBUMIN 2.5* 2.5*   No results for input(s): LIPASE, AMYLASE in the last 168 hours. No results for input(s): AMMONIA in the last 168 hours. CBC: Recent Labs  Lab 04/11/17 0500 04/19/17 0500  WBC 35.0* 37.5*  HGB 9.4* 8.8*  HCT 28.9* 27.8*  MCV 86.5 89.7  PLT 56* 61*   Cardiac Enzymes: No results for input(s): CKTOTAL, CKMB, CKMBINDEX, TROPONINI in the last 168 hours. D-Dimer No results for input(s): DDIMER in the last 72 hours. BNP: Invalid input(s): POCBNP CBG: No results for input(s): GLUCAP in the last 168 hours. Anemia work up No results for input(s): VITAMINB12, FOLATE, FERRITIN, TIBC, IRON, RETICCTPCT in the last 72 hours. Urinalysis    Component Value Date/Time   COLORURINE YELLOW 04/04/2017 1525   APPEARANCEUR CLOUDY (A) 04/04/2017 1525   LABSPEC 1.011 04/04/2017 1525   PHURINE 5.0 04/04/2017 1525   GLUCOSEU NEGATIVE 04/04/2017 1525   HGBUR LARGE (A) 04/04/2017 1525   BILIRUBINUR NEGATIVE 04/04/2017 1525   KETONESUR NEGATIVE 04/04/2017 1525   PROTEINUR 30 (A) 04/04/2017 1525   NITRITE NEGATIVE 04/04/2017 1525   LEUKOCYTESUR NEGATIVE 04/04/2017 1525   Sepsis Labs Invalid input(s): PROCALCITONIN,  WBC,   LACTICIDVEN     SIGNED:  Nita Sells, MD  [facility] 04/17/2017, 5:36 PM Pager   If 7PM-7AM, please contact night-coverage [email or web address] Password [email or web address]

## 2017-05-02 DEATH — deceased

## 2017-05-07 LAB — FUNGAL ORGANISM REFLEX

## 2018-03-02 IMAGING — DX DG CHEST 1V
1 series · 1 of 1 positions shown · non-contrast
Comparison: 10/09/2016

CLINICAL DATA: Right anterior shoulder pain. Post right
thoracentesis

EXAM:
CHEST 1 VIEW

[chest ap]
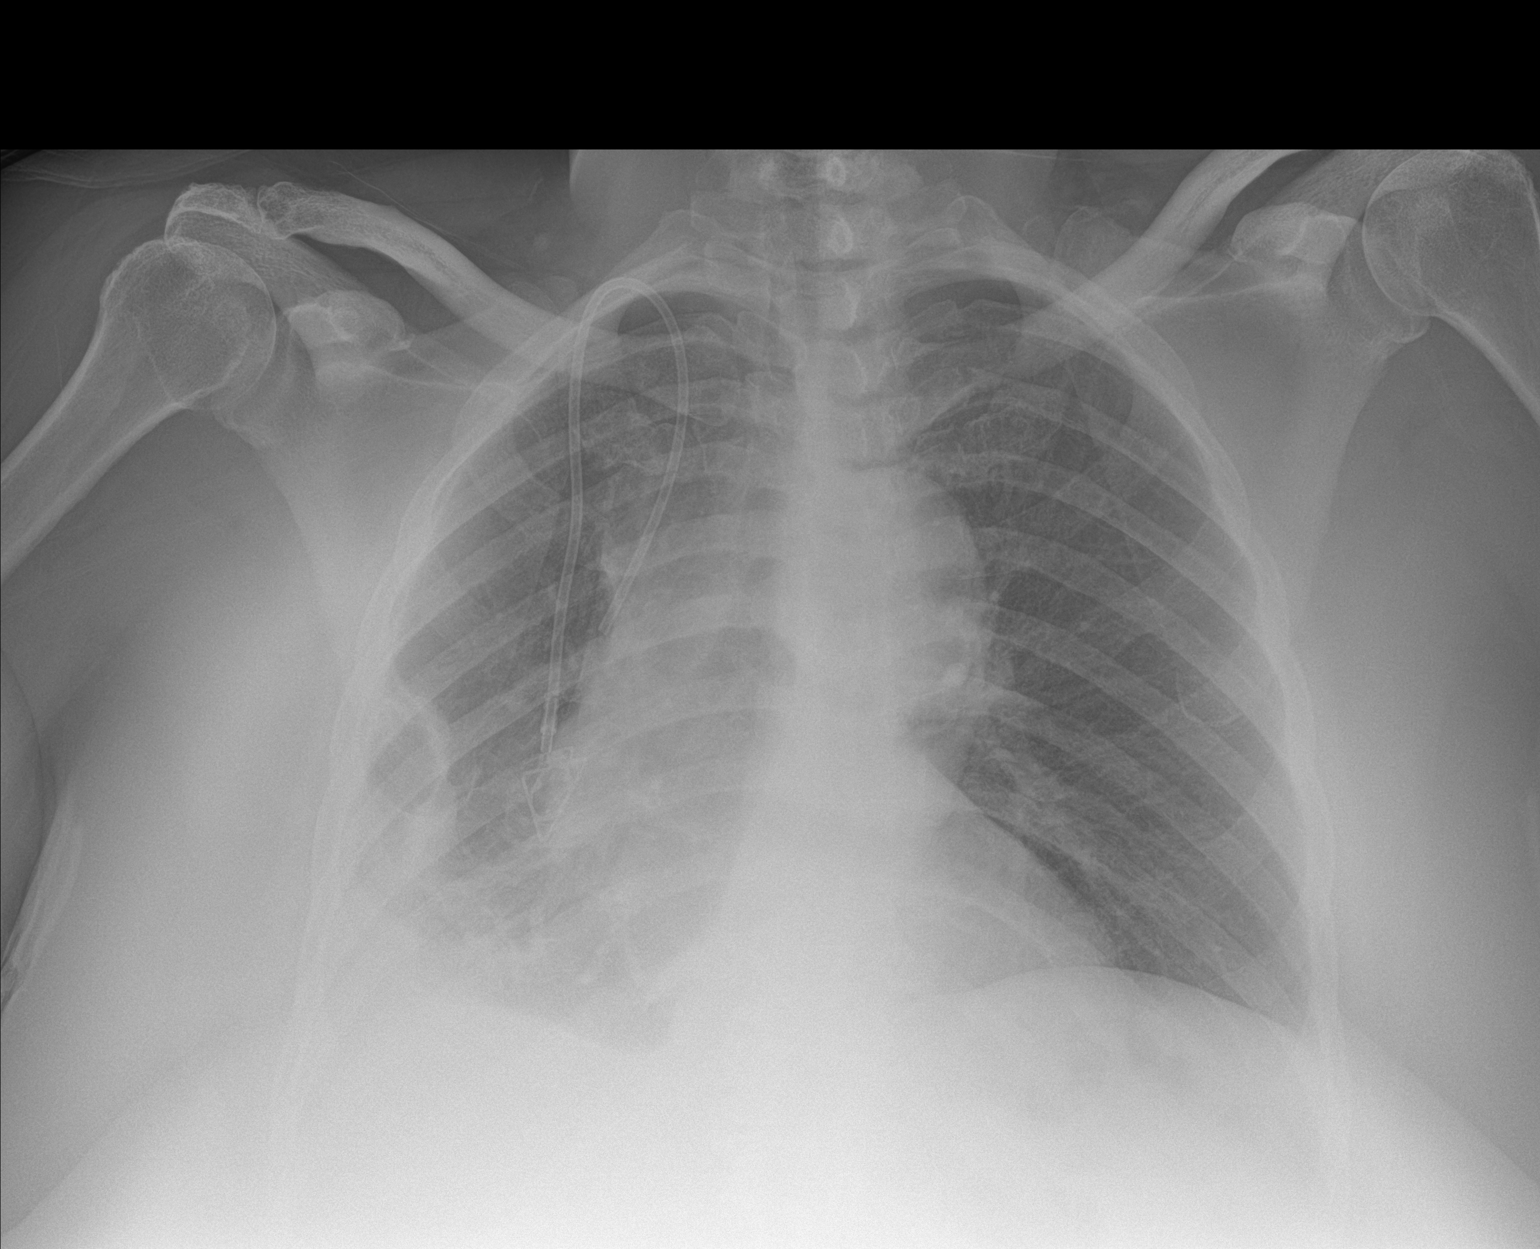

[1 of 1 positions shown; findings below may reference images not displayed]

FINDINGS: Right Port-A-Cath remains in place, unchanged. No pneumothorax
following right thoracentesis. Small right pleural effusion with
right lower lobe airspace disease. Mild cardiomegaly. Left lung
clear.
IMPRESSION: No pneumothorax following right thoracentesis. Small right effusion
and right lower lobe airspace disease again noted, unchanged.

## 2018-08-08 IMAGING — MR MR MRA HEAD W/O CM
2 series · 20 of 48 positions shown · non-contrast
Comparison: 04/05/2017

CLINICAL DATA: Lower extremity weakness. Right third nerve palsy.
Sepsis. Progression of lymphoma.

EXAM:
MRA HEAD WITHOUT CONTRAST
TECHNIQUE: Angiographic images of the Circle of Willis were obtained using MRA
technique without intravenous contrast.

[Series 3: ax (id) 2 · axial · 1.0mm · 0.43mm/px · z∈[-57,+30]mm · 19 of 184 slices shown]
[im 1/184]
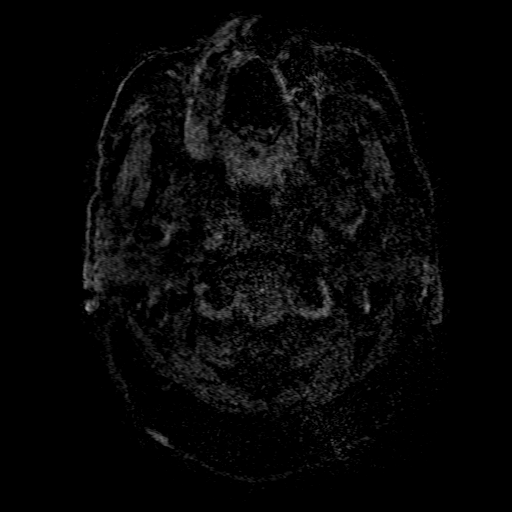
[im 4/184]
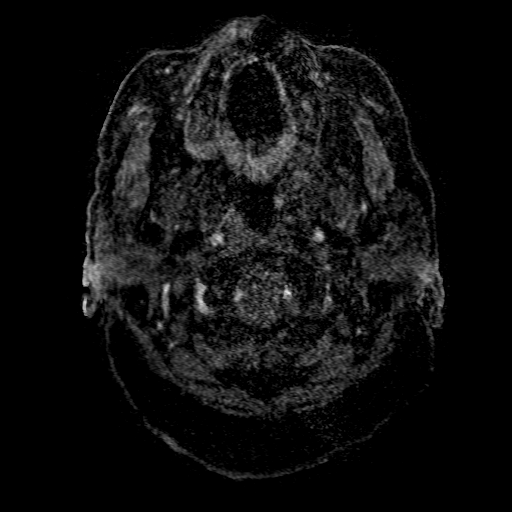
[im 8/184]
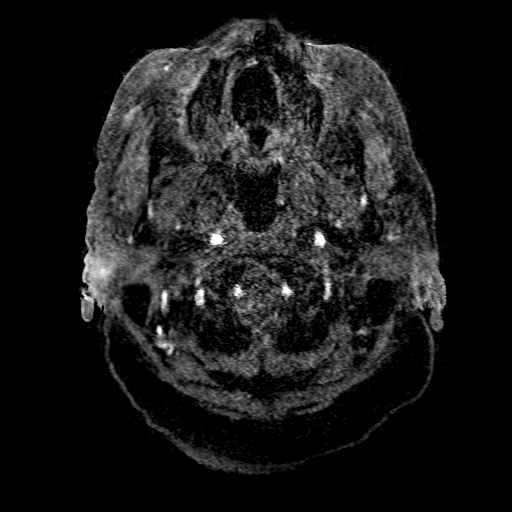
[im 12/184]
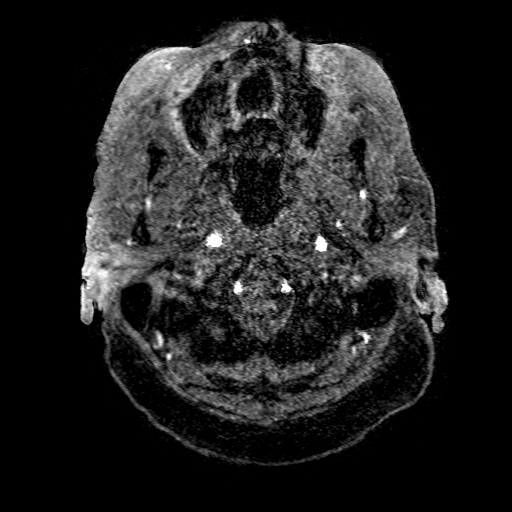
[im 16/184]
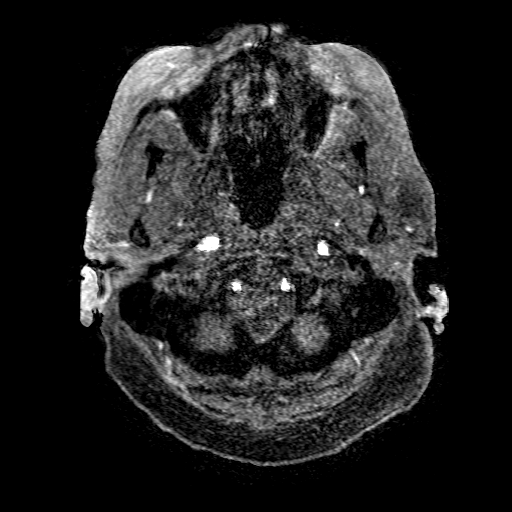
[im 20/184]
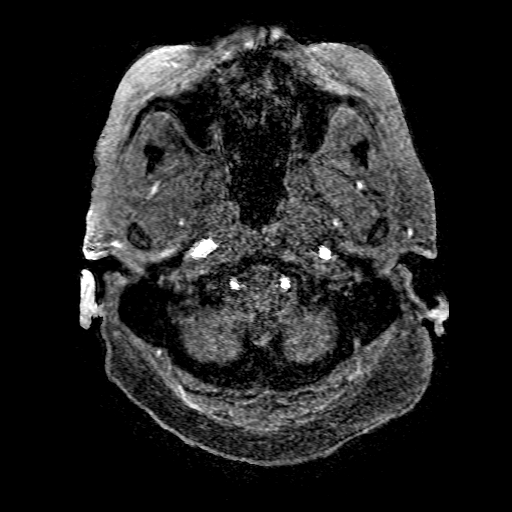
[im 24/184]
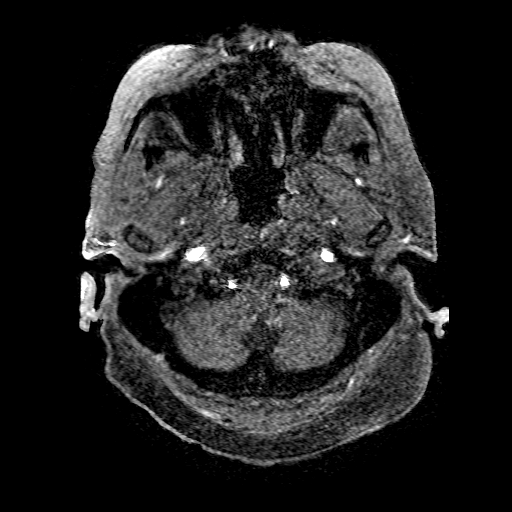
[im 28/184]
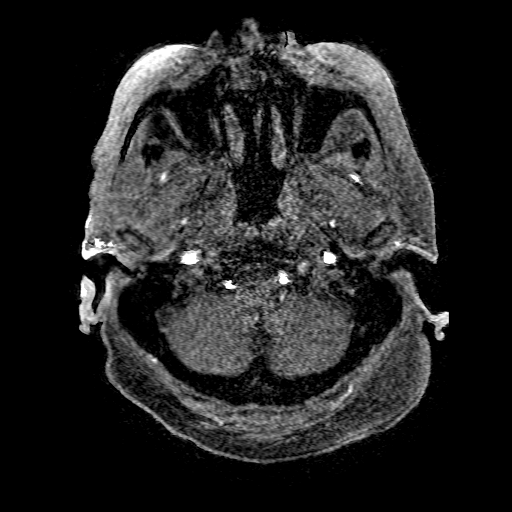
[im 32/184]
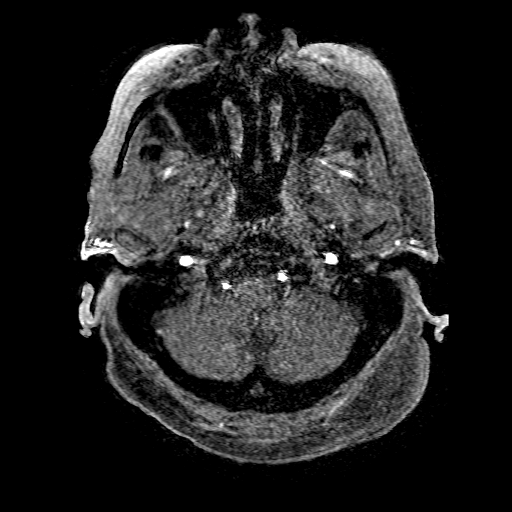
[im 36/184]
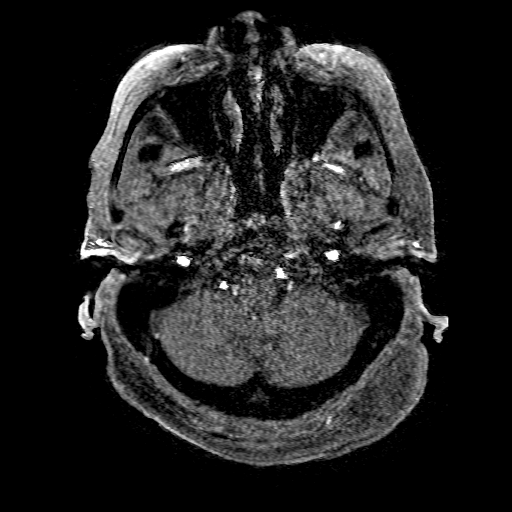
[im 40/184]
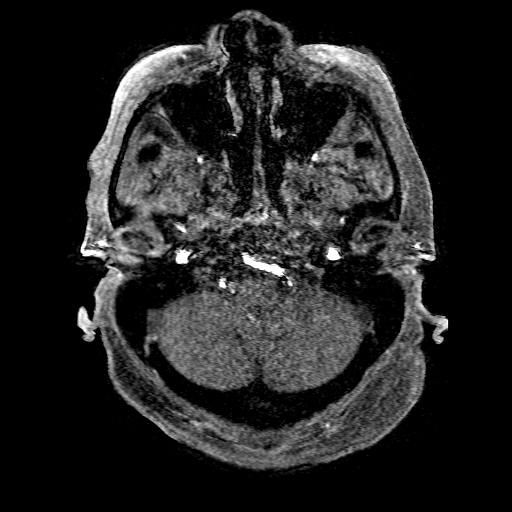
[im 56/184]
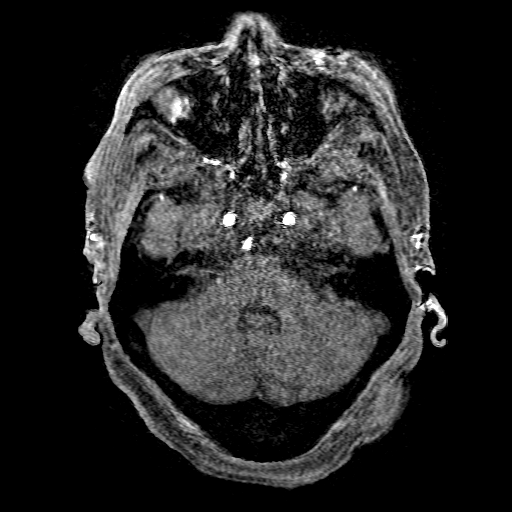
[im 80/184]
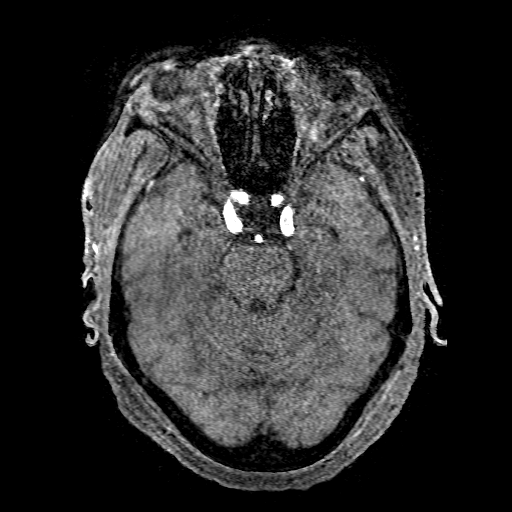
[im 92/184]
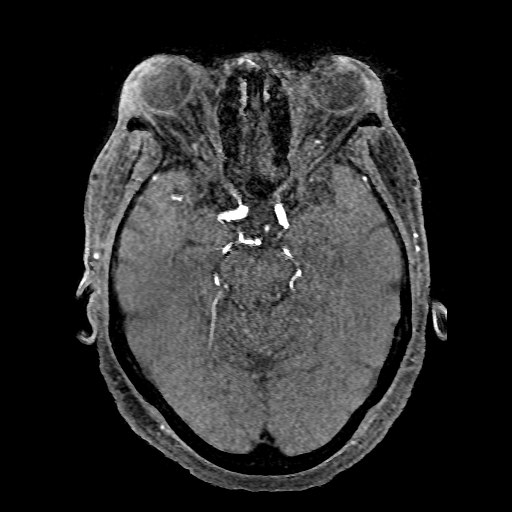
[im 104/184]
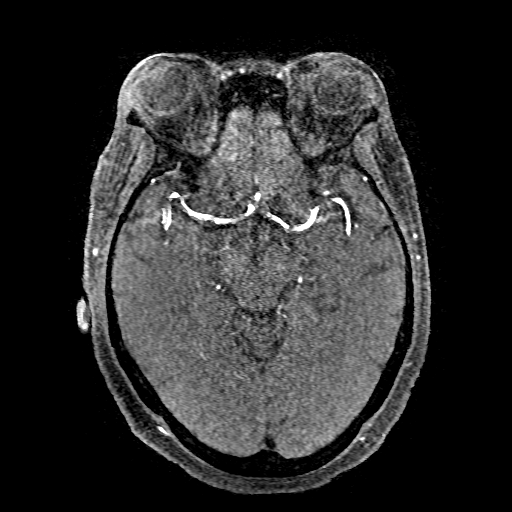
[im 128/184]
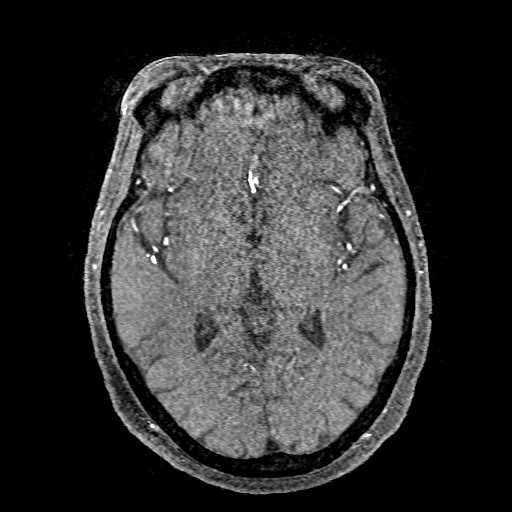
[im 152/184]
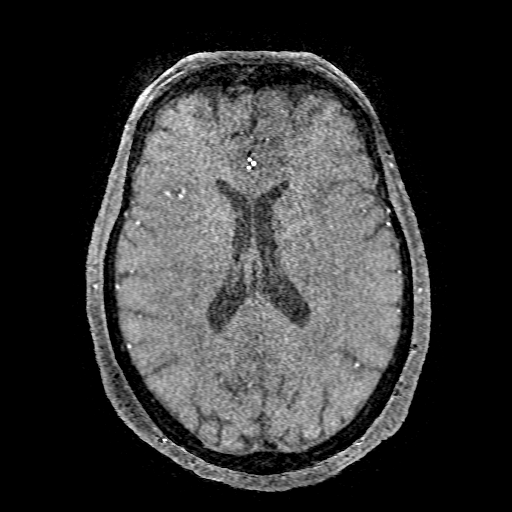
[im 156/184]
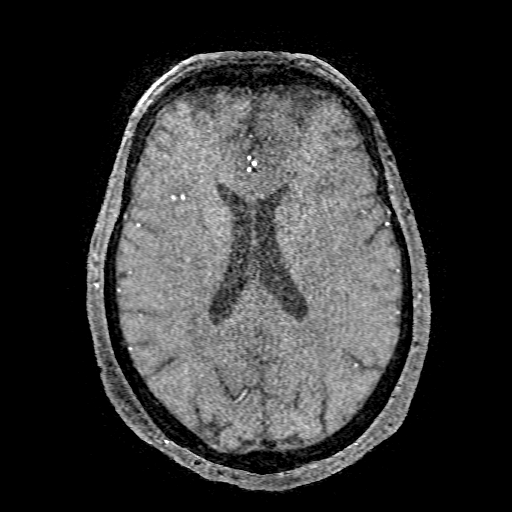
[im 176/184]
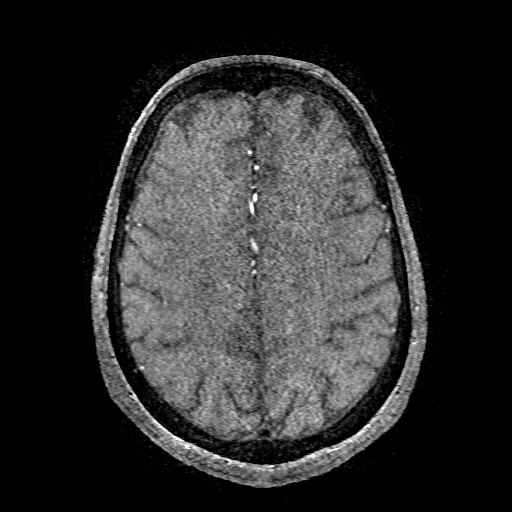

[Series 353: anterior tumble · axial · 1.0mm · 0.48mm/px · 1 of 1 slices shown]
[im 1/1]
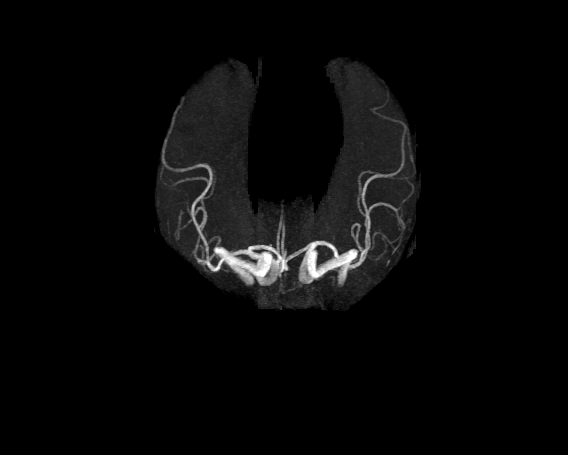

[20 of 48 positions shown; findings below may reference images not displayed]

FINDINGS: Brain: Brain has normal appearance without evidence of malformation,
atrophy, old or acute small or large vessel infarction, mass lesion,
hemorrhage, hydrocephalus or extra-axial collection.

Vascular: Major vessels at the base of the brain show flow. Venous
sinuses appear patent.

Skull and upper cervical spine: Normal.

Sinuses/Orbits: Clear/normal.

Other: None significant.
IMPRESSION: Normal intracranial MR angiography of the large and medium size
vessels.

## 2018-08-13 IMAGING — DX DG CHEST 1V PORT
1 series · 1 of 1 positions shown · non-contrast
Comparison: 04/09/2017

CLINICAL DATA: Evaluate fluid overload. Hx A-Fib with tachycardia,
Non-Hodgkin's Lymphoma, and Recurrent Right Sided Peural Effusion

EXAM:
PORTABLE CHEST 1 VIEW

[chest]
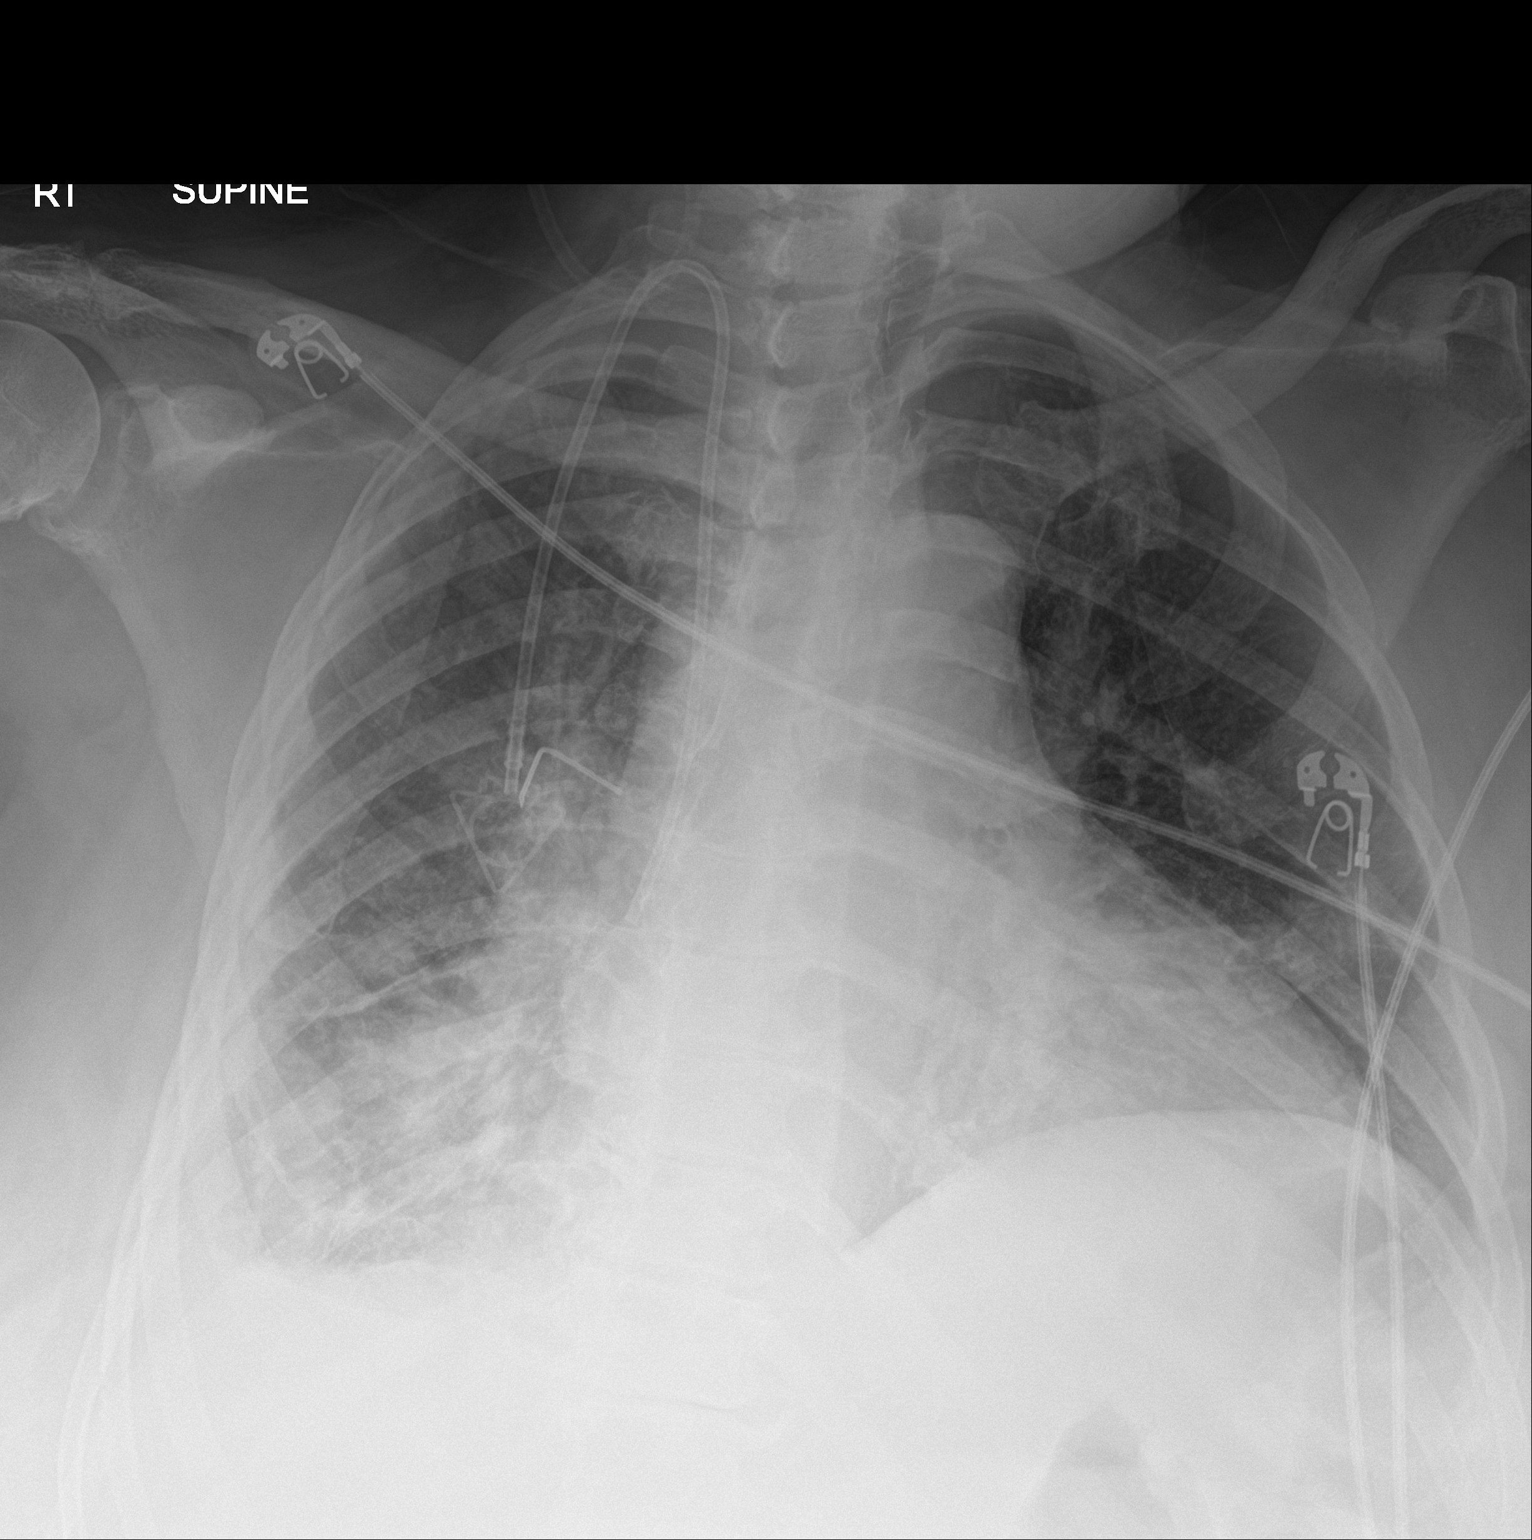

[1 of 1 positions shown; findings below may reference images not displayed]

FINDINGS: Right lower lung zone interstitial and hazy airspace opacities are
without significant change from the most recent prior exam allowing
for differences in patient positioning and technique. Probable small
right effusion. Left lung remains clear. Right anterior chest wall
Port-A-Cath is stable.
IMPRESSION: 1. No significant change from the most recent prior exam with
persistent right lower lung zone opacities and probable small right
effusion. No new abnormalities.
